# Patient Record
Sex: Male | Born: 1960 | Race: White | Hispanic: No | Marital: Married | Smoking: Never smoker
Health system: Southern US, Community
[De-identification: ages and names within clinical notes are randomized; demographics above are authoritative.]

## PROBLEM LIST (undated history)

## (undated) DIAGNOSIS — Z87442 Personal history of urinary calculi: Secondary | ICD-10-CM

## (undated) DIAGNOSIS — M179 Osteoarthritis of knee, unspecified: Secondary | ICD-10-CM

## (undated) DIAGNOSIS — G709 Myoneural disorder, unspecified: Secondary | ICD-10-CM

## (undated) DIAGNOSIS — F4310 Post-traumatic stress disorder, unspecified: Secondary | ICD-10-CM

## (undated) DIAGNOSIS — I712 Thoracic aortic aneurysm, without rupture: Secondary | ICD-10-CM

## (undated) DIAGNOSIS — E785 Hyperlipidemia, unspecified: Secondary | ICD-10-CM

## (undated) DIAGNOSIS — R7989 Other specified abnormal findings of blood chemistry: Secondary | ICD-10-CM

## (undated) DIAGNOSIS — R519 Headache, unspecified: Secondary | ICD-10-CM

## (undated) DIAGNOSIS — IMO0002 Reserved for concepts with insufficient information to code with codable children: Secondary | ICD-10-CM

## (undated) DIAGNOSIS — M1710 Unilateral primary osteoarthritis, unspecified knee: Secondary | ICD-10-CM

## (undated) DIAGNOSIS — S0990XA Unspecified injury of head, initial encounter: Secondary | ICD-10-CM

## (undated) DIAGNOSIS — I10 Essential (primary) hypertension: Secondary | ICD-10-CM

## (undated) DIAGNOSIS — K275 Chronic or unspecified peptic ulcer, site unspecified, with perforation: Secondary | ICD-10-CM

## (undated) DIAGNOSIS — M25562 Pain in left knee: Secondary | ICD-10-CM

## (undated) DIAGNOSIS — G629 Polyneuropathy, unspecified: Secondary | ICD-10-CM

## (undated) DIAGNOSIS — R945 Abnormal results of liver function studies: Secondary | ICD-10-CM

## (undated) DIAGNOSIS — F319 Bipolar disorder, unspecified: Secondary | ICD-10-CM

## (undated) DIAGNOSIS — G4733 Obstructive sleep apnea (adult) (pediatric): Secondary | ICD-10-CM

## (undated) DIAGNOSIS — Z9889 Other specified postprocedural states: Secondary | ICD-10-CM

## (undated) DIAGNOSIS — M545 Low back pain, unspecified: Secondary | ICD-10-CM

## (undated) DIAGNOSIS — I2699 Other pulmonary embolism without acute cor pulmonale: Secondary | ICD-10-CM

## (undated) DIAGNOSIS — Z9289 Personal history of other medical treatment: Secondary | ICD-10-CM

## (undated) DIAGNOSIS — F4323 Adjustment disorder with mixed anxiety and depressed mood: Secondary | ICD-10-CM

## (undated) DIAGNOSIS — R112 Nausea with vomiting, unspecified: Secondary | ICD-10-CM

## (undated) DIAGNOSIS — Z87820 Personal history of traumatic brain injury: Secondary | ICD-10-CM

## (undated) DIAGNOSIS — E538 Deficiency of other specified B group vitamins: Secondary | ICD-10-CM

## (undated) DIAGNOSIS — F32A Depression, unspecified: Secondary | ICD-10-CM

## (undated) DIAGNOSIS — K219 Gastro-esophageal reflux disease without esophagitis: Secondary | ICD-10-CM

## (undated) DIAGNOSIS — R51 Headache: Secondary | ICD-10-CM

## (undated) DIAGNOSIS — E669 Obesity, unspecified: Secondary | ICD-10-CM

## (undated) DIAGNOSIS — K859 Acute pancreatitis without necrosis or infection, unspecified: Secondary | ICD-10-CM

## (undated) DIAGNOSIS — I7121 Aneurysm of the ascending aorta, without rupture: Secondary | ICD-10-CM

## (undated) DIAGNOSIS — R079 Chest pain, unspecified: Secondary | ICD-10-CM

## (undated) DIAGNOSIS — F419 Anxiety disorder, unspecified: Secondary | ICD-10-CM

## (undated) HISTORY — DX: Chest pain, unspecified: R07.9

## (undated) HISTORY — PX: TONSILLECTOMY: SUR1361

## (undated) HISTORY — PX: EPIDURAL BLOCK INJECTION: SHX1516

## (undated) HISTORY — DX: Personal history of traumatic brain injury: Z87.820

## (undated) HISTORY — DX: Unilateral primary osteoarthritis, unspecified knee: M17.10

## (undated) HISTORY — PX: FOOT SURGERY: SHX648

## (undated) HISTORY — DX: Osteoarthritis of knee, unspecified: M17.9

## (undated) HISTORY — DX: Adjustment disorder with mixed anxiety and depressed mood: F43.23

## (undated) HISTORY — DX: Essential (primary) hypertension: I10

## (undated) HISTORY — DX: Abnormal results of liver function studies: R94.5

## (undated) HISTORY — DX: Obesity, unspecified: E66.9

## (undated) HISTORY — PX: NASAL SEPTOPLASTY W/ TURBINOPLASTY: SHX2070

## (undated) HISTORY — DX: Personal history of other medical treatment: Z92.89

## (undated) HISTORY — PX: VASECTOMY: SHX75

## (undated) HISTORY — DX: Other pulmonary embolism without acute cor pulmonale: I26.99

## (undated) HISTORY — DX: Deficiency of other specified B group vitamins: E53.8

## (undated) HISTORY — DX: Personal history of urinary calculi: Z87.442

## (undated) HISTORY — PX: DIAGNOSTIC LAPAROSCOPY: SUR761

## (undated) HISTORY — DX: Unspecified injury of head, initial encounter: S09.90XA

## (undated) HISTORY — DX: Thoracic aortic aneurysm, without rupture: I71.2

## (undated) HISTORY — DX: Low back pain, unspecified: M54.50

## (undated) HISTORY — DX: Aneurysm of the ascending aorta, without rupture: I71.21

## (undated) HISTORY — DX: Hyperlipidemia, unspecified: E78.5

## (undated) HISTORY — DX: Other specified abnormal findings of blood chemistry: R79.89

## (undated) HISTORY — DX: Pain in left knee: M25.562

## (undated) HISTORY — DX: Obstructive sleep apnea (adult) (pediatric): G47.33

## (undated) HISTORY — DX: Chronic or unspecified peptic ulcer, site unspecified, with perforation: K27.5

## (undated) HISTORY — PX: OTHER SURGICAL HISTORY: SHX169

---

## 1996-09-15 HISTORY — PX: HERNIA REPAIR: SHX51

## 1999-01-10 ENCOUNTER — Emergency Department (HOSPITAL_COMMUNITY): Admission: EM | Admit: 1999-01-10 | Discharge: 1999-01-10 | Payer: Self-pay | Admitting: Emergency Medicine

## 1999-01-10 ENCOUNTER — Encounter: Payer: Self-pay | Admitting: Emergency Medicine

## 2000-01-21 ENCOUNTER — Encounter: Payer: Self-pay | Admitting: Gastroenterology

## 2000-01-21 ENCOUNTER — Encounter: Admission: RE | Admit: 2000-01-21 | Discharge: 2000-01-21 | Payer: Self-pay | Admitting: Gastroenterology

## 2001-01-19 ENCOUNTER — Encounter: Admission: RE | Admit: 2001-01-19 | Discharge: 2001-01-19 | Payer: Self-pay | Admitting: Specialist

## 2001-01-19 ENCOUNTER — Encounter: Payer: Self-pay | Admitting: Specialist

## 2002-01-19 ENCOUNTER — Observation Stay (HOSPITAL_COMMUNITY): Admission: EM | Admit: 2002-01-19 | Discharge: 2002-01-20 | Payer: Self-pay | Admitting: Cardiology

## 2004-05-09 ENCOUNTER — Encounter: Admission: RE | Admit: 2004-05-09 | Discharge: 2004-08-07 | Payer: Self-pay | Admitting: Neurology

## 2004-06-19 ENCOUNTER — Ambulatory Visit: Payer: Self-pay | Admitting: Psychology

## 2004-06-21 ENCOUNTER — Encounter (INDEPENDENT_AMBULATORY_CARE_PROVIDER_SITE_OTHER): Payer: Self-pay | Admitting: Specialist

## 2004-06-21 ENCOUNTER — Ambulatory Visit (HOSPITAL_COMMUNITY): Admission: RE | Admit: 2004-06-21 | Discharge: 2004-06-21 | Payer: Self-pay

## 2004-06-21 ENCOUNTER — Ambulatory Visit (HOSPITAL_BASED_OUTPATIENT_CLINIC_OR_DEPARTMENT_OTHER): Admission: RE | Admit: 2004-06-21 | Discharge: 2004-06-21 | Payer: Self-pay

## 2004-07-01 ENCOUNTER — Encounter: Admission: RE | Admit: 2004-07-01 | Discharge: 2004-07-01 | Payer: Self-pay | Admitting: Internal Medicine

## 2004-07-08 ENCOUNTER — Encounter: Admission: RE | Admit: 2004-07-08 | Discharge: 2004-08-22 | Payer: Self-pay | Admitting: Internal Medicine

## 2004-07-29 ENCOUNTER — Ambulatory Visit: Payer: Self-pay | Admitting: Internal Medicine

## 2004-07-30 ENCOUNTER — Ambulatory Visit: Payer: Self-pay | Admitting: Psychology

## 2004-08-01 ENCOUNTER — Ambulatory Visit: Payer: Self-pay | Admitting: Internal Medicine

## 2004-09-19 ENCOUNTER — Ambulatory Visit: Payer: Self-pay | Admitting: Internal Medicine

## 2005-03-02 ENCOUNTER — Emergency Department (HOSPITAL_COMMUNITY): Admission: EM | Admit: 2005-03-02 | Discharge: 2005-03-02 | Payer: Self-pay | Admitting: Family Medicine

## 2005-03-10 ENCOUNTER — Ambulatory Visit: Payer: Self-pay | Admitting: Internal Medicine

## 2005-09-07 ENCOUNTER — Emergency Department (HOSPITAL_COMMUNITY): Admission: EM | Admit: 2005-09-07 | Discharge: 2005-09-07 | Payer: Self-pay | Admitting: Family Medicine

## 2005-09-15 HISTORY — PX: UVULOPALATOPHARYNGOPLASTY: SHX827

## 2005-09-18 ENCOUNTER — Ambulatory Visit: Payer: Self-pay | Admitting: Internal Medicine

## 2005-09-19 ENCOUNTER — Ambulatory Visit: Payer: Self-pay | Admitting: Cardiology

## 2005-10-08 ENCOUNTER — Ambulatory Visit: Payer: Self-pay | Admitting: Internal Medicine

## 2005-10-13 ENCOUNTER — Ambulatory Visit: Payer: Self-pay

## 2005-10-21 ENCOUNTER — Encounter: Payer: Self-pay | Admitting: Internal Medicine

## 2005-10-21 ENCOUNTER — Ambulatory Visit: Payer: Self-pay | Admitting: Emergency Medicine

## 2005-11-03 ENCOUNTER — Ambulatory Visit: Payer: Self-pay | Admitting: Internal Medicine

## 2005-11-07 ENCOUNTER — Ambulatory Visit: Payer: Self-pay | Admitting: Internal Medicine

## 2005-11-19 ENCOUNTER — Encounter: Payer: Self-pay | Admitting: Internal Medicine

## 2005-11-19 ENCOUNTER — Ambulatory Visit: Payer: Self-pay | Admitting: Emergency Medicine

## 2005-12-12 ENCOUNTER — Ambulatory Visit: Payer: Self-pay | Admitting: Internal Medicine

## 2005-12-22 ENCOUNTER — Ambulatory Visit: Payer: Self-pay | Admitting: Internal Medicine

## 2006-01-30 ENCOUNTER — Ambulatory Visit: Payer: Self-pay | Admitting: Internal Medicine

## 2006-02-10 ENCOUNTER — Ambulatory Visit: Payer: Self-pay | Admitting: Internal Medicine

## 2006-07-31 ENCOUNTER — Encounter (INDEPENDENT_AMBULATORY_CARE_PROVIDER_SITE_OTHER): Payer: Self-pay | Admitting: *Deleted

## 2006-07-31 ENCOUNTER — Ambulatory Visit (HOSPITAL_COMMUNITY): Admission: RE | Admit: 2006-07-31 | Discharge: 2006-08-01 | Payer: Self-pay | Admitting: Otolaryngology

## 2006-09-15 HISTORY — PX: CARPAL TUNNEL RELEASE: SHX101

## 2007-04-01 ENCOUNTER — Ambulatory Visit: Payer: Self-pay | Admitting: Internal Medicine

## 2007-04-01 LAB — CONVERTED CEMR LAB
ALT: 198 units/L — ABNORMAL HIGH (ref 0–53)
AST: 143 units/L — ABNORMAL HIGH (ref 0–37)
Albumin: 4.2 g/dL (ref 3.5–5.2)
Alkaline Phosphatase: 106 units/L (ref 39–117)
BUN: 9 mg/dL (ref 6–23)
Bilirubin, Direct: 0.1 mg/dL (ref 0.0–0.3)
CO2: 30 meq/L (ref 19–32)
Calcium: 9.6 mg/dL (ref 8.4–10.5)
Chloride: 98 meq/L (ref 96–112)
Cholesterol: 170 mg/dL (ref 0–200)
Creatinine, Ser: 1.1 mg/dL (ref 0.4–1.5)
Direct LDL: 113.2 mg/dL
GFR calc Af Amer: 93 mL/min
GFR calc non Af Amer: 77 mL/min
Glucose, Bld: 382 mg/dL — ABNORMAL HIGH (ref 70–99)
HDL: 26.3 mg/dL — ABNORMAL LOW (ref >39.0)
Hgb A1c MFr Bld: 13.2 % — ABNORMAL HIGH (ref 4.6–6.0)
Potassium: 4.1 meq/L (ref 3.5–5.1)
Sodium: 138 meq/L (ref 135–145)
TSH: 1.23 microintl units/mL (ref 0.35–5.50)
Total Bilirubin: 1.1 mg/dL (ref 0.3–1.2)
Total CHOL/HDL Ratio: 6.5
Total Protein: 7.9 g/dL (ref 6.0–8.3)
Triglycerides: 251 mg/dL (ref 0–149)
VLDL: 50 mg/dL — ABNORMAL HIGH (ref 0–40)

## 2007-04-02 ENCOUNTER — Telehealth: Payer: Self-pay | Admitting: *Deleted

## 2007-04-06 ENCOUNTER — Ambulatory Visit: Payer: Self-pay | Admitting: Internal Medicine

## 2007-04-06 DIAGNOSIS — G5600 Carpal tunnel syndrome, unspecified upper limb: Secondary | ICD-10-CM

## 2007-04-06 DIAGNOSIS — E119 Type 2 diabetes mellitus without complications: Secondary | ICD-10-CM | POA: Insufficient documentation

## 2007-04-06 DIAGNOSIS — E538 Deficiency of other specified B group vitamins: Secondary | ICD-10-CM | POA: Insufficient documentation

## 2007-04-06 DIAGNOSIS — E785 Hyperlipidemia, unspecified: Secondary | ICD-10-CM | POA: Insufficient documentation

## 2007-04-06 HISTORY — DX: Carpal tunnel syndrome, unspecified upper limb: G56.00

## 2007-04-06 LAB — CONVERTED CEMR LAB: Blood Glucose, Fingerstick: 125

## 2007-04-21 ENCOUNTER — Telehealth: Payer: Self-pay | Admitting: Internal Medicine

## 2007-04-28 ENCOUNTER — Ambulatory Visit: Payer: Self-pay | Admitting: Internal Medicine

## 2007-04-28 LAB — CONVERTED CEMR LAB
ALT: 105 units/L — ABNORMAL HIGH (ref 0–53)
AST: 92 units/L — ABNORMAL HIGH (ref 0–37)
Albumin: 4.3 g/dL (ref 3.5–5.2)
Alkaline Phosphatase: 58 units/L (ref 39–117)
BUN: 14 mg/dL (ref 6–23)
Bilirubin, Direct: 0.1 mg/dL (ref 0.0–0.3)
CO2: 29 meq/L (ref 19–32)
Calcium: 9.2 mg/dL (ref 8.4–10.5)
Chloride: 101 meq/L (ref 96–112)
Creatinine, Ser: 1.1 mg/dL (ref 0.4–1.5)
Folate: 9.7 ng/mL
GFR calc Af Amer: 93 mL/min
GFR calc non Af Amer: 77 mL/min
Glucose, Bld: 114 mg/dL — ABNORMAL HIGH (ref 70–99)
Potassium: 3.9 meq/L (ref 3.5–5.1)
Sodium: 137 meq/L (ref 135–145)
Total Bilirubin: 0.9 mg/dL (ref 0.3–1.2)
Total Protein: 7.4 g/dL (ref 6.0–8.3)
Vitamin B-12: 227 pg/mL (ref 211–911)

## 2007-05-05 ENCOUNTER — Ambulatory Visit: Payer: Self-pay | Admitting: Internal Medicine

## 2007-05-18 ENCOUNTER — Telehealth: Payer: Self-pay | Admitting: Internal Medicine

## 2007-05-21 ENCOUNTER — Telehealth: Payer: Self-pay | Admitting: Internal Medicine

## 2007-07-19 ENCOUNTER — Telehealth: Payer: Self-pay | Admitting: Internal Medicine

## 2007-07-19 ENCOUNTER — Ambulatory Visit: Payer: Self-pay | Admitting: Internal Medicine

## 2007-07-20 ENCOUNTER — Telehealth: Payer: Self-pay | Admitting: Internal Medicine

## 2007-07-22 DIAGNOSIS — G4733 Obstructive sleep apnea (adult) (pediatric): Secondary | ICD-10-CM | POA: Insufficient documentation

## 2007-07-22 DIAGNOSIS — J45909 Unspecified asthma, uncomplicated: Secondary | ICD-10-CM | POA: Insufficient documentation

## 2007-07-24 ENCOUNTER — Encounter: Payer: Self-pay | Admitting: Internal Medicine

## 2007-07-26 LAB — CONVERTED CEMR LAB
ALT: 80 units/L — ABNORMAL HIGH (ref 0–53)
AST: 48 units/L — ABNORMAL HIGH (ref 0–37)
Albumin: 4 g/dL (ref 3.5–5.2)
Alkaline Phosphatase: 59 units/L (ref 39–117)
Bilirubin, Direct: 0.1 mg/dL (ref 0.0–0.3)
Creatinine,U: 112.9 mg/dL
Hgb A1c MFr Bld: 6.3 % — ABNORMAL HIGH (ref 4.6–6.0)
Microalb Creat Ratio: 3.5 mg/g (ref 0.0–30.0)
Microalb, Ur: 0.4 mg/dL (ref 0.0–1.9)
Total Bilirubin: 0.8 mg/dL (ref 0.3–1.2)
Total Protein: 7.1 g/dL (ref 6.0–8.3)

## 2007-08-02 ENCOUNTER — Ambulatory Visit: Payer: Self-pay | Admitting: Internal Medicine

## 2007-08-02 DIAGNOSIS — M109 Gout, unspecified: Secondary | ICD-10-CM | POA: Insufficient documentation

## 2007-08-13 ENCOUNTER — Ambulatory Visit: Payer: Self-pay | Admitting: Pulmonary Disease

## 2007-08-20 ENCOUNTER — Encounter: Payer: Self-pay | Admitting: Internal Medicine

## 2007-09-02 ENCOUNTER — Encounter: Payer: Self-pay | Admitting: Pulmonary Disease

## 2007-09-13 ENCOUNTER — Encounter: Payer: Self-pay | Admitting: Internal Medicine

## 2007-09-21 ENCOUNTER — Encounter: Payer: Self-pay | Admitting: Internal Medicine

## 2007-11-12 ENCOUNTER — Ambulatory Visit: Payer: Self-pay | Admitting: Internal Medicine

## 2007-11-12 LAB — CONVERTED CEMR LAB: Blood Glucose, Fingerstick: 320

## 2007-11-19 ENCOUNTER — Ambulatory Visit: Payer: Self-pay | Admitting: Internal Medicine

## 2007-11-19 LAB — CONVERTED CEMR LAB
Blood Glucose, Fingerstick: 154
Inflenza A Ag: NEGATIVE
Rapid Strep: NEGATIVE

## 2007-12-10 ENCOUNTER — Ambulatory Visit: Payer: Self-pay | Admitting: Internal Medicine

## 2007-12-10 DIAGNOSIS — E669 Obesity, unspecified: Secondary | ICD-10-CM | POA: Insufficient documentation

## 2007-12-10 HISTORY — DX: Obesity, unspecified: E66.9

## 2008-01-28 ENCOUNTER — Ambulatory Visit: Payer: Self-pay | Admitting: Internal Medicine

## 2008-01-30 LAB — CONVERTED CEMR LAB
ALT: 158 units/L — ABNORMAL HIGH (ref 0–53)
AST: 124 units/L — ABNORMAL HIGH (ref 0–37)
Albumin: 4.2 g/dL (ref 3.5–5.2)
Alkaline Phosphatase: 64 units/L (ref 39–117)
BUN: 10 mg/dL (ref 6–23)
Bilirubin, Direct: 0.1 mg/dL (ref 0.0–0.3)
CO2: 33 meq/L — ABNORMAL HIGH (ref 19–32)
Calcium: 9.7 mg/dL (ref 8.4–10.5)
Chloride: 108 meq/L (ref 96–112)
Cholesterol: 197 mg/dL (ref 0–200)
Creatinine, Ser: 1.2 mg/dL (ref 0.4–1.5)
Creatinine,U: 139.3 mg/dL
GFR calc Af Amer: 84 mL/min
GFR calc non Af Amer: 69 mL/min
Glucose, Bld: 138 mg/dL — ABNORMAL HIGH (ref 70–99)
HDL: 24.9 mg/dL — ABNORMAL LOW (ref >39.0)
Hgb A1c MFr Bld: 7 % — ABNORMAL HIGH (ref 4.6–6.0)
LDL Cholesterol: 146 mg/dL — ABNORMAL HIGH (ref 0–99)
Microalb Creat Ratio: 3.6 mg/g (ref 0.0–30.0)
Microalb, Ur: 0.5 mg/dL (ref 0.0–1.9)
Potassium: 4.5 meq/L (ref 3.5–5.1)
Sodium: 147 meq/L — ABNORMAL HIGH (ref 135–145)
TSH: 1 microintl units/mL (ref 0.35–5.50)
Total Bilirubin: 0.8 mg/dL (ref 0.3–1.2)
Total CHOL/HDL Ratio: 7.9
Total Protein: 7.9 g/dL (ref 6.0–8.3)
Triglycerides: 132 mg/dL (ref 0–149)
VLDL: 26 mg/dL (ref 0–40)

## 2008-02-04 ENCOUNTER — Ambulatory Visit: Payer: Self-pay | Admitting: Internal Medicine

## 2008-02-04 DIAGNOSIS — T50995A Adverse effect of other drugs, medicaments and biological substances, initial encounter: Secondary | ICD-10-CM | POA: Insufficient documentation

## 2008-02-04 LAB — CONVERTED CEMR LAB: Blood Glucose, Fingerstick: 262

## 2008-02-08 ENCOUNTER — Encounter (INDEPENDENT_AMBULATORY_CARE_PROVIDER_SITE_OTHER): Payer: Self-pay | Admitting: *Deleted

## 2008-03-31 ENCOUNTER — Telehealth: Payer: Self-pay | Admitting: *Deleted

## 2008-04-07 ENCOUNTER — Telehealth: Payer: Self-pay | Admitting: *Deleted

## 2008-04-11 DIAGNOSIS — G4730 Sleep apnea, unspecified: Secondary | ICD-10-CM | POA: Insufficient documentation

## 2008-04-14 ENCOUNTER — Ambulatory Visit: Payer: Self-pay | Admitting: Gastroenterology

## 2008-04-17 ENCOUNTER — Telehealth: Payer: Self-pay | Admitting: Gastroenterology

## 2008-04-18 ENCOUNTER — Ambulatory Visit: Payer: Self-pay | Admitting: Gastroenterology

## 2008-04-19 ENCOUNTER — Encounter: Payer: Self-pay | Admitting: Gastroenterology

## 2008-04-24 ENCOUNTER — Telehealth: Payer: Self-pay | Admitting: *Deleted

## 2008-05-23 ENCOUNTER — Telehealth: Payer: Self-pay | Admitting: *Deleted

## 2008-06-04 ENCOUNTER — Encounter: Payer: Self-pay | Admitting: Pulmonary Disease

## 2008-07-31 ENCOUNTER — Encounter: Payer: Self-pay | Admitting: Internal Medicine

## 2008-08-28 ENCOUNTER — Telehealth (INDEPENDENT_AMBULATORY_CARE_PROVIDER_SITE_OTHER): Payer: Self-pay | Admitting: *Deleted

## 2008-09-21 ENCOUNTER — Telehealth: Payer: Self-pay | Admitting: *Deleted

## 2008-10-13 ENCOUNTER — Ambulatory Visit: Payer: Self-pay | Admitting: Internal Medicine

## 2008-10-13 LAB — CONVERTED CEMR LAB
Cholesterol: 168 mg/dL (ref 0–200)
HDL: 29.7 mg/dL — ABNORMAL LOW (ref >39.0)
Hgb A1c MFr Bld: 6.8 % — ABNORMAL HIGH (ref 4.6–6.0)
LDL Cholesterol: 118 mg/dL — ABNORMAL HIGH (ref 0–99)
Total CHOL/HDL Ratio: 5.7
Triglycerides: 100 mg/dL (ref 0–149)
VLDL: 20 mg/dL (ref 0–40)

## 2008-10-20 ENCOUNTER — Telehealth: Payer: Self-pay | Admitting: Pulmonary Disease

## 2008-10-20 ENCOUNTER — Ambulatory Visit: Payer: Self-pay | Admitting: Internal Medicine

## 2008-10-20 DIAGNOSIS — M549 Dorsalgia, unspecified: Secondary | ICD-10-CM | POA: Insufficient documentation

## 2008-11-01 ENCOUNTER — Encounter: Payer: Self-pay | Admitting: Internal Medicine

## 2008-11-08 ENCOUNTER — Encounter: Admission: RE | Admit: 2008-11-08 | Discharge: 2008-11-08 | Payer: Self-pay | Admitting: Gastroenterology

## 2008-11-16 ENCOUNTER — Encounter: Payer: Self-pay | Admitting: Gastroenterology

## 2008-11-16 ENCOUNTER — Encounter: Admission: RE | Admit: 2008-11-16 | Discharge: 2008-11-16 | Payer: Self-pay | Admitting: Gastroenterology

## 2009-05-11 ENCOUNTER — Ambulatory Visit: Payer: Self-pay | Admitting: Internal Medicine

## 2009-05-11 LAB — CONVERTED CEMR LAB
ALT: 121 units/L — ABNORMAL HIGH (ref 0–53)
AST: 91 units/L — ABNORMAL HIGH (ref 0–37)
Albumin: 4.3 g/dL (ref 3.5–5.2)
Alkaline Phosphatase: 66 units/L (ref 39–117)
BUN: 13 mg/dL (ref 6–23)
Basophils Absolute: 0 10*3/uL (ref 0.0–0.1)
Basophils Relative: 0.3 % (ref 0.0–3.0)
Bilirubin, Direct: 0.1 mg/dL (ref 0.0–0.3)
CO2: 30 meq/L (ref 19–32)
Calcium: 8.9 mg/dL (ref 8.4–10.5)
Chloride: 107 meq/L (ref 96–112)
Creatinine, Ser: 1.2 mg/dL (ref 0.4–1.5)
Creatinine,U: 220.8 mg/dL
Eosinophils Absolute: 0.2 10*3/uL (ref 0.0–0.7)
Eosinophils Relative: 2.8 % (ref 0.0–5.0)
GFR calc non Af Amer: 68.79 mL/min (ref >60)
Glucose, Bld: 162 mg/dL — ABNORMAL HIGH (ref 70–99)
HCT: 46.2 % (ref 39.0–52.0)
Hemoglobin: 16 g/dL (ref 13.0–17.0)
Hgb A1c MFr Bld: 7.9 % — ABNORMAL HIGH (ref 4.6–6.5)
Lymphocytes Relative: 41.6 % (ref 12.0–46.0)
Lymphs Abs: 2.3 10*3/uL (ref 0.7–4.0)
MCHC: 34.6 g/dL (ref 30.0–36.0)
MCV: 95.7 fL (ref 78.0–100.0)
Microalb Creat Ratio: 3.2 mg/g (ref 0.0–30.0)
Microalb, Ur: 0.7 mg/dL (ref 0.0–1.9)
Monocytes Absolute: 0.3 10*3/uL (ref 0.1–1.0)
Monocytes Relative: 6.2 % (ref 3.0–12.0)
Neutro Abs: 2.7 10*3/uL (ref 1.4–7.7)
Neutrophils Relative %: 49.1 % (ref 43.0–77.0)
Platelets: 131 10*3/uL — ABNORMAL LOW (ref 150.0–400.0)
Potassium: 4 meq/L (ref 3.5–5.1)
RBC: 4.83 M/uL (ref 4.22–5.81)
RDW: 12.7 % (ref 11.5–14.6)
Sodium: 142 meq/L (ref 135–145)
TSH: 1.29 microintl units/mL (ref 0.35–5.50)
Total Bilirubin: 1 mg/dL (ref 0.3–1.2)
Total Protein: 7.7 g/dL (ref 6.0–8.3)
Vitamin B-12: 214 pg/mL (ref 211–911)
WBC: 5.5 10*3/uL (ref 4.5–10.5)

## 2009-05-15 ENCOUNTER — Ambulatory Visit: Payer: Self-pay | Admitting: Internal Medicine

## 2009-06-19 ENCOUNTER — Ambulatory Visit: Payer: Self-pay | Admitting: Gastroenterology

## 2009-07-11 ENCOUNTER — Encounter: Payer: Self-pay | Admitting: Internal Medicine

## 2009-07-12 ENCOUNTER — Encounter: Payer: Self-pay | Admitting: Internal Medicine

## 2009-07-26 ENCOUNTER — Encounter (INDEPENDENT_AMBULATORY_CARE_PROVIDER_SITE_OTHER): Payer: Self-pay | Admitting: *Deleted

## 2009-07-26 ENCOUNTER — Ambulatory Visit: Payer: Self-pay | Admitting: Internal Medicine

## 2009-07-31 ENCOUNTER — Encounter: Admission: RE | Admit: 2009-07-31 | Discharge: 2009-09-12 | Payer: Self-pay | Admitting: Internal Medicine

## 2009-07-31 ENCOUNTER — Encounter: Payer: Self-pay | Admitting: Internal Medicine

## 2009-08-02 ENCOUNTER — Encounter (INDEPENDENT_AMBULATORY_CARE_PROVIDER_SITE_OTHER): Payer: Self-pay | Admitting: *Deleted

## 2009-08-06 ENCOUNTER — Telehealth: Payer: Self-pay | Admitting: *Deleted

## 2009-08-08 ENCOUNTER — Ambulatory Visit: Payer: Self-pay | Admitting: Internal Medicine

## 2009-08-17 ENCOUNTER — Encounter (INDEPENDENT_AMBULATORY_CARE_PROVIDER_SITE_OTHER): Payer: Self-pay | Admitting: *Deleted

## 2009-08-20 LAB — CONVERTED CEMR LAB
ALT: 107 units/L — ABNORMAL HIGH (ref 0–53)
AST: 86 units/L — ABNORMAL HIGH (ref 0–37)
Albumin: 4.1 g/dL (ref 3.5–5.2)
Alkaline Phosphatase: 69 units/L (ref 39–117)
Bilirubin, Direct: 0.1 mg/dL (ref 0.0–0.3)
Hgb A1c MFr Bld: 7.6 % — ABNORMAL HIGH (ref 4.6–6.5)
Total Bilirubin: 1 mg/dL (ref 0.3–1.2)
Total Protein: 7.4 g/dL (ref 6.0–8.3)

## 2009-08-23 ENCOUNTER — Telehealth: Payer: Self-pay | Admitting: Internal Medicine

## 2009-08-23 ENCOUNTER — Ambulatory Visit: Payer: Self-pay | Admitting: Internal Medicine

## 2009-08-23 DIAGNOSIS — F4323 Adjustment disorder with mixed anxiety and depressed mood: Secondary | ICD-10-CM

## 2009-08-23 HISTORY — DX: Adjustment disorder with mixed anxiety and depressed mood: F43.23

## 2009-08-24 ENCOUNTER — Encounter (INDEPENDENT_AMBULATORY_CARE_PROVIDER_SITE_OTHER): Payer: Self-pay | Admitting: *Deleted

## 2009-08-28 ENCOUNTER — Ambulatory Visit (HOSPITAL_COMMUNITY): Admission: RE | Admit: 2009-08-28 | Discharge: 2009-08-28 | Payer: Self-pay | Admitting: Otolaryngology

## 2009-08-29 ENCOUNTER — Ambulatory Visit (HOSPITAL_COMMUNITY): Admission: RE | Admit: 2009-08-29 | Discharge: 2009-08-29 | Payer: Self-pay | Admitting: Surgery

## 2009-09-24 ENCOUNTER — Ambulatory Visit: Payer: Self-pay | Admitting: Internal Medicine

## 2009-09-24 ENCOUNTER — Encounter: Admission: RE | Admit: 2009-09-24 | Discharge: 2009-12-23 | Payer: Self-pay | Admitting: Surgery

## 2009-10-16 LAB — HM DIABETES EYE EXAM

## 2009-10-18 ENCOUNTER — Encounter: Admission: RE | Admit: 2009-10-18 | Discharge: 2010-01-16 | Payer: Self-pay | Admitting: Surgery

## 2009-10-22 ENCOUNTER — Telehealth: Payer: Self-pay | Admitting: *Deleted

## 2009-10-22 ENCOUNTER — Encounter: Payer: Self-pay | Admitting: Internal Medicine

## 2009-11-06 ENCOUNTER — Inpatient Hospital Stay (HOSPITAL_COMMUNITY): Admission: RE | Admit: 2009-11-06 | Discharge: 2009-11-08 | Payer: Self-pay | Admitting: Surgery

## 2009-11-06 HISTORY — PX: GASTRIC BYPASS: SHX52

## 2009-11-07 ENCOUNTER — Ambulatory Visit: Payer: Self-pay | Admitting: Vascular Surgery

## 2009-11-07 ENCOUNTER — Encounter (INDEPENDENT_AMBULATORY_CARE_PROVIDER_SITE_OTHER): Payer: Self-pay | Admitting: Surgery

## 2009-11-19 ENCOUNTER — Ambulatory Visit: Payer: Self-pay | Admitting: Internal Medicine

## 2009-11-19 LAB — CONVERTED CEMR LAB
ALT: 25 units/L (ref 0–53)
AST: 28 units/L (ref 0–37)
Albumin: 3.9 g/dL (ref 3.5–5.2)
Alkaline Phosphatase: 61 units/L (ref 39–117)
BUN: 18 mg/dL (ref 6–23)
Bilirubin, Direct: 0.2 mg/dL (ref 0.0–0.3)
CO2: 29 meq/L (ref 19–32)
Calcium: 9.2 mg/dL (ref 8.4–10.5)
Chloride: 103 meq/L (ref 96–112)
Cholesterol: 131 mg/dL (ref 0–200)
Creatinine, Ser: 1.4 mg/dL (ref 0.4–1.5)
GFR calc non Af Amer: 57.45 mL/min (ref >60)
Glucose, Bld: 116 mg/dL — ABNORMAL HIGH (ref 70–99)
HDL: 28.2 mg/dL — ABNORMAL LOW (ref >39.00)
Hgb A1c MFr Bld: 6.1 % (ref 4.6–6.5)
LDL Cholesterol: 74 mg/dL (ref 0–99)
Potassium: 4.2 meq/L (ref 3.5–5.1)
Sodium: 139 meq/L (ref 135–145)
Total Bilirubin: 0.6 mg/dL (ref 0.3–1.2)
Total CHOL/HDL Ratio: 5
Total Protein: 8 g/dL (ref 6.0–8.3)
Triglycerides: 142 mg/dL (ref 0.0–149.0)
VLDL: 28.4 mg/dL (ref 0.0–40.0)

## 2009-11-22 ENCOUNTER — Encounter: Payer: Self-pay | Admitting: Internal Medicine

## 2009-11-26 ENCOUNTER — Ambulatory Visit: Payer: Self-pay | Admitting: Internal Medicine

## 2009-12-28 ENCOUNTER — Telehealth: Payer: Self-pay | Admitting: *Deleted

## 2010-01-01 ENCOUNTER — Telehealth: Payer: Self-pay | Admitting: *Deleted

## 2010-02-18 ENCOUNTER — Ambulatory Visit: Payer: Self-pay | Admitting: Internal Medicine

## 2010-02-18 ENCOUNTER — Encounter: Payer: Self-pay | Admitting: Pulmonary Disease

## 2010-02-18 LAB — CONVERTED CEMR LAB
BUN: 9 mg/dL (ref 6–23)
Basophils Absolute: 0 10*3/uL (ref 0.0–0.1)
Basophils Relative: 0.4 % (ref 0.0–3.0)
CO2: 33 meq/L — ABNORMAL HIGH (ref 19–32)
Calcium: 9.6 mg/dL (ref 8.4–10.5)
Chloride: 106 meq/L (ref 96–112)
Creatinine, Ser: 1.1 mg/dL (ref 0.4–1.5)
Eosinophils Absolute: 0.1 10*3/uL (ref 0.0–0.7)
Eosinophils Relative: 1.4 % (ref 0.0–5.0)
GFR calc non Af Amer: 76.61 mL/min (ref >60)
Glucose, Bld: 118 mg/dL — ABNORMAL HIGH (ref 70–99)
HCT: 47.5 % (ref 39.0–52.0)
Hemoglobin: 16 g/dL (ref 13.0–17.0)
Hgb A1c MFr Bld: 5.7 % (ref 4.6–6.5)
Lymphocytes Relative: 29.7 % (ref 12.0–46.0)
Lymphs Abs: 1.9 10*3/uL (ref 0.7–4.0)
MCHC: 33.7 g/dL (ref 30.0–36.0)
MCV: 95.8 fL (ref 78.0–100.0)
Monocytes Absolute: 0.4 10*3/uL (ref 0.1–1.0)
Monocytes Relative: 5.5 % (ref 3.0–12.0)
Neutro Abs: 4 10*3/uL (ref 1.4–7.7)
Neutrophils Relative %: 63 % (ref 43.0–77.0)
Platelets: 132 10*3/uL — ABNORMAL LOW (ref 150.0–400.0)
Potassium: 5.2 meq/L — ABNORMAL HIGH (ref 3.5–5.1)
RBC: 4.96 M/uL (ref 4.22–5.81)
RDW: 14.9 % — ABNORMAL HIGH (ref 11.5–14.6)
Sodium: 146 meq/L — ABNORMAL HIGH (ref 135–145)
Uric Acid, Serum: 7.1 mg/dL (ref 4.0–7.8)
WBC: 6.4 10*3/uL (ref 4.5–10.5)

## 2010-03-04 ENCOUNTER — Ambulatory Visit: Payer: Self-pay | Admitting: Internal Medicine

## 2010-03-04 LAB — HM DIABETES FOOT EXAM

## 2010-03-11 ENCOUNTER — Encounter: Payer: Self-pay | Admitting: *Deleted

## 2010-04-09 ENCOUNTER — Encounter: Payer: Self-pay | Admitting: Pulmonary Disease

## 2010-05-17 ENCOUNTER — Ambulatory Visit: Payer: Self-pay | Admitting: Internal Medicine

## 2010-05-17 LAB — CONVERTED CEMR LAB
ALT: 16 units/L (ref 0–53)
AST: 20 units/L (ref 0–37)
Albumin: 3.9 g/dL (ref 3.5–5.2)
Alkaline Phosphatase: 73 units/L (ref 39–117)
Basophils Absolute: 0 10*3/uL (ref 0.0–0.1)
Basophils Relative: 0.3 % (ref 0.0–3.0)
Bilirubin, Direct: 0.1 mg/dL (ref 0.0–0.3)
Cholesterol: 145 mg/dL (ref 0–200)
Eosinophils Absolute: 0.1 10*3/uL (ref 0.0–0.7)
Eosinophils Relative: 2.5 % (ref 0.0–5.0)
HCT: 42.7 % (ref 39.0–52.0)
HDL: 33.3 mg/dL — ABNORMAL LOW (ref >39.00)
Hemoglobin: 14.6 g/dL (ref 13.0–17.0)
Hgb A1c MFr Bld: 6.3 % (ref 4.6–6.5)
LDL Cholesterol: 85 mg/dL (ref 0–99)
Lymphocytes Relative: 36.6 % (ref 12.0–46.0)
Lymphs Abs: 2 10*3/uL (ref 0.7–4.0)
MCHC: 34.1 g/dL (ref 30.0–36.0)
MCV: 93.9 fL (ref 78.0–100.0)
Monocytes Absolute: 0.3 10*3/uL (ref 0.1–1.0)
Monocytes Relative: 6 % (ref 3.0–12.0)
Neutro Abs: 3 10*3/uL (ref 1.4–7.7)
Neutrophils Relative %: 54.6 % (ref 43.0–77.0)
Platelets: 124 10*3/uL — ABNORMAL LOW (ref 150.0–400.0)
RBC: 4.54 M/uL (ref 4.22–5.81)
RDW: 14.2 % (ref 11.5–14.6)
Total Bilirubin: 0.6 mg/dL (ref 0.3–1.2)
Total CHOL/HDL Ratio: 4
Total Protein: 6.3 g/dL (ref 6.0–8.3)
Triglycerides: 134 mg/dL (ref 0.0–149.0)
VLDL: 26.8 mg/dL (ref 0.0–40.0)
Vitamin B-12: 513 pg/mL (ref 211–911)
WBC: 5.4 10*3/uL (ref 4.5–10.5)

## 2010-05-24 ENCOUNTER — Ambulatory Visit: Payer: Self-pay | Admitting: Internal Medicine

## 2010-05-24 DIAGNOSIS — D696 Thrombocytopenia, unspecified: Secondary | ICD-10-CM | POA: Insufficient documentation

## 2010-06-08 ENCOUNTER — Inpatient Hospital Stay (HOSPITAL_COMMUNITY)
Admission: EM | Admit: 2010-06-08 | Discharge: 2010-06-15 | Payer: Self-pay | Source: Home / Self Care | Admitting: Emergency Medicine

## 2010-06-08 HISTORY — PX: OTHER SURGICAL HISTORY: SHX169

## 2010-06-17 ENCOUNTER — Encounter: Payer: Self-pay | Admitting: *Deleted

## 2010-07-04 ENCOUNTER — Encounter: Payer: Self-pay | Admitting: Internal Medicine

## 2010-09-15 HISTORY — PX: BACK SURGERY: SHX140

## 2010-09-20 ENCOUNTER — Ambulatory Visit
Admission: RE | Admit: 2010-09-20 | Discharge: 2010-09-20 | Payer: Self-pay | Source: Home / Self Care | Attending: Internal Medicine | Admitting: Internal Medicine

## 2010-09-20 ENCOUNTER — Other Ambulatory Visit: Payer: Self-pay | Admitting: Internal Medicine

## 2010-09-20 LAB — CBC WITH DIFFERENTIAL/PLATELET
Basophils Absolute: 0 10*3/uL (ref 0.0–0.1)
Basophils Relative: 0.4 % (ref 0.0–3.0)
Eosinophils Absolute: 0.1 10*3/uL (ref 0.0–0.7)
Eosinophils Relative: 1.9 % (ref 0.0–5.0)
HCT: 44.7 % (ref 39.0–52.0)
Hemoglobin: 15.3 g/dL (ref 13.0–17.0)
Lymphocytes Relative: 38.7 % (ref 12.0–46.0)
Lymphs Abs: 2 10*3/uL (ref 0.7–4.0)
MCHC: 34.1 g/dL (ref 30.0–36.0)
MCV: 95.2 fl (ref 78.0–100.0)
Monocytes Absolute: 0.3 10*3/uL (ref 0.1–1.0)
Monocytes Relative: 5.7 % (ref 3.0–12.0)
Neutro Abs: 2.8 10*3/uL (ref 1.4–7.7)
Neutrophils Relative %: 53.3 % (ref 43.0–77.0)
Platelets: 136 10*3/uL — ABNORMAL LOW (ref 150.0–400.0)
RBC: 4.7 Mil/uL (ref 4.22–5.81)
RDW: 13.6 % (ref 11.5–14.6)
WBC: 5.3 10*3/uL (ref 4.5–10.5)

## 2010-09-20 LAB — HEMOGLOBIN A1C: Hgb A1c MFr Bld: 6 % (ref 4.6–6.5)

## 2010-09-30 ENCOUNTER — Ambulatory Visit (HOSPITAL_COMMUNITY)
Admission: RE | Admit: 2010-09-30 | Discharge: 2010-09-30 | Payer: Self-pay | Source: Home / Self Care | Attending: Surgery | Admitting: Surgery

## 2010-10-07 ENCOUNTER — Ambulatory Visit
Admission: RE | Admit: 2010-10-07 | Discharge: 2010-10-07 | Payer: Self-pay | Source: Home / Self Care | Attending: Internal Medicine | Admitting: Internal Medicine

## 2010-10-07 DIAGNOSIS — IMO0002 Reserved for concepts with insufficient information to code with codable children: Secondary | ICD-10-CM | POA: Insufficient documentation

## 2010-10-15 NOTE — Letter (Signed)
Summary: [facility]  [facility]   Imported By: Lester Caroleen 04/17/2010 07:11:48  _____________________________________________________________________  External Attachment:    Type:   Image     Comment:   External Document

## 2010-10-15 NOTE — Assessment & Plan Note (Signed)
Summary: 2 month rov/njr   Vital Signs:  Patient profile:   50 year old male Weight:      247 pounds Pulse rate:   60 / minute BP sitting:   110 / 70  (right arm) Cuff size:   large  Vitals Entered By: Romualdo Bolk, CMA Duncan Dull) (November 26, 2009 2:53 PM) CC: Follow-up visit on labs   History of Present Illness: Hector [name] comes in today for    follow up multiple medical problems after gastric bypass. He is doing well except for falring of gout.   DM   increase  BG    after solid food.  2 metformin and 20 unslui at night.  no lows  doing well has lost 30 #  Gout    flared in right foot and the knee.   Unable to take nsaid until recently complication the course .  ? if suppression rs appropriate.  LIVER:  said looked nl at surgery. LIPids  No se of meds .    Preventive Screening-Counseling & Management  Alcohol-Tobacco     Alcohol drinks/day: <1     Smoking Status: never  Caffeine-Diet-Exercise     Caffeine use/day: 1     Does Patient Exercise: no     Type of exercise: Walking     Times/week: 3  Current Medications (verified): 1)  Indomethacin 50 Mg Caps (Indomethacin) .[name].. 1 Po Qd As Needed 2)  Lexapro 10 Mg Tabs (Escitalopram Oxalate) .... 3 By Mouth Once Daily 3)  Glucophage Xr 500 Mg  Xr24h-Tab (Metformin Hcl) .... 2 Once Daily By Mouth 4)  Bd Ultra-Fine Pen Needles 29g X 12.81mm  Misc (Insulin Pen Needle) .... Use As Directed 5)  Adult Aspirin Low Strength 81 Mg  Tbdp (Aspirin) .... Take 1 Tablet By Mouth Once A Day 6)  Albuterol 90 Mcg/act  Aers (Albuterol) .... Inhale 2 Puffs Every 4 Hrs As Needed 7)  Centrum Silver  Chew (Multiple Vitamins-Minerals) .[name].. 1 Two Times A Day 8)  Lantus Solostar 100 Unit/ml  Soln (Insulin Glargine) .... 20  Units Per Night or As Directed With Pen Needles 9)  Humalog Kwikpen 100 Unit/ml  Soln (Insulin Lispro (Human)) .... Use As Directed 10)  Methocarbamol 750 Mg Tabs (Methocarbamol) .... Per Dr [name] 11)  Co-Gesic 5-500 Mg Tabs  (Hydrocodone-Acetaminophen) .... Per Dr [name] 12)  Amitriptyline Hcl 10 Mg Tabs (Amitriptyline Hcl) .... Take 2-3 By Mouth Qhs For Sleep 13)  Simvastatin 20 Mg Tabs (Simvastatin) .[name].. 1  By Mouth  By Mouth Once Daily 14)  Calcium Citrate +  Tabs (Multiple Minerals-Vitamins) 15)  Cyanocobalamin 2500 Mcg Subl (Cyanocobalamin)  Allergies (verified): No Known Drug Allergies  Past History:  Past medical, surgical, family and social histories (including risk factors) reviewed, and no changes noted (except as noted below).  Past Medical History: Reviewed history from 10/20/2008 and no changes required. 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 Family History of CAD Male 1st degree relative <50 Family History High cholesterol Family History Hypertension Family History of Liver Disease/Cirrhosis:Maternal uncle due to alcohol died age 13 Family History of Uterine Cancer:mother Family History of Colon Cancer:paternal aunt, maternal  uncle Family History of Lung Cancer: Maternal grandmother, paternal grandmother Family History of Diabetes: pateranl uncles, paternal aunts Family History of Heart Disease: materanl grandfather,father  Social History: Reviewed history from 06/19/2009 and no changes required. 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    Physical Exam  General:  Well-developed,well-nourished,in no acute distress; alert,appropriate and cooperative throughout examination Head:  normocephalic and atraumatic.   Eyes:  vision grossly intact, pupils equal, and pupils round.   Neck:  No deformities, masses, or tenderness noted. Lungs:  Normal respiratory effort, chest expands symmetrically. Lungs are clear to auscultation, no crackles or wheezes. Heart:  Normal rate and regular rhythm. S1 and S2 normal without gallop, murmur, click, rub or other extra sounds. Abdomen:  soft, non-tender, no hepatomegaly, and no splenomegaly.  healing scars no hernia   Msk:  no joint warmth and no redness over joints.  minimal right mtp swelling  Pulses:  pulses intact without delay   Extremities:  no clubbing cyanosis or edema  Neurologic:  alert & oriented X3.  slightly antalgic gait  Skin:  turgor normal, color normal, no petechiae, and no purpura.   Cervical Nodes:  No lymphadenopathy noted Psych:  Oriented X3, good eye contact, not anxious appearing, and not depressed appearing.    Diabetes Management Exam:    Foot Exam (with socks and/or shoes not present):       Sensory-Monofilament:          Left foot: normal          Right foot: normal       Inspection:          Right foot: abnormal             Comments: increase calus and skin cracking   no infection       Nails:          Left foot: normal          Right foot: normal   Impression & Recommendations:  Problem # 1:  DIABETES MELLITUS, TYPE II (ICD-250.00) Assessment Improved much better  after weight loss surgery but increase some after adding solids back  . will increase insulin as needed His updated medication list for this problem includes:     Glucophage Xr 500 Mg Xr24h-tab (Metformin hcl) .[name]... 2 once daily by mouth    Adult Aspirin Low Strength 81 Mg Tbdp (Aspirin) .[name]... Take 1 tablet by mouth once a day    Lantus Solostar 100 Unit/ml Soln (Insulin glargine) .[name]... 20  units per night or as directed with pen needles    Humalog Kwikpen 100 Unit/ml Soln (Insulin lispro (human)) ..... Use as directed  Problem # 2:  TRANSAMINASES, SERUM, ELEVATED FATY LIVER (ICD-790.4) Assessment: Improved normal today!      assoicated woith weight loss and diet change  Problem # 3:  BODY MASS INDEX 40 AND OVER ADULT (ICD-V85.4) Assessment: Improved  Problem # 4:  GOUT (ICD-274.9) Assessment: Deteriorated was unable to take nsaid around surgery time so  prolonged  waxing and waning signs  but better today   . foot and knee ? joint.  will begin on allopurinol suppressive  rx and  indocin and monitor Uric acid . His updated medication list for this problem includes:    Indomethacin 50 Mg Caps (Indomethacin) .[name]... 1 po qd as needed    Allopurinol 100 Mg Tabs (Allopurinol) .[name]... Take one by mouth once daily for a week  increase  to 2 by mouth once daily after a week and 3 by mouth once daily for a week  Problem # 5:  HYPERLIPIDEMIA NEC/NOS (ICD-272.4) Assessment: Improved  His updated medication list for this problem includes:    Simvastatin 20 Mg Tabs (Simvastatin) .[name]... 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   Complete Medication List: 1)  Indomethacin 50 Mg Caps (Indomethacin) .[name].. 1 po qd as needed 2)  Lexapro 10 Mg Tabs (Escitalopram oxalate) .... 3 by mouth once daily 3)  Glucophage Xr 500 Mg Xr24h-tab  (Metformin hcl) .... 2 once daily by mouth 4)  Bd Ultra-fine Pen Needles 29g X 12.40mm Misc (Insulin pen needle) .... Use as directed 5)  Adult Aspirin Low Strength 81 Mg Tbdp (Aspirin) .... Take 1 tablet by mouth once a day 6)  Albuterol 90 Mcg/act Aers (Albuterol) .... Inhale 2 puffs every 4 hrs as needed 7)  Centrum Silver Chew (Multiple vitamins-minerals) .[name].. 1 two times a day 8)  Lantus Solostar 100 Unit/ml Soln (Insulin glargine) .... 20  units per night or as directed with pen needles 9)  Humalog Kwikpen 100 Unit/ml Soln (Insulin lispro (human)) .... Use as directed 10)  Methocarbamol 750 Mg Tabs (Methocarbamol) .... Per dr bean 11)  Co-gesic 5-500 Mg Tabs (Hydrocodone-acetaminophen) .... Per dr bean 12)  Amitriptyline Hcl 10 Mg Tabs (Amitriptyline hcl) .... Take 2-3 by mouth qhs for sleep 13)  Simvastatin 20 Mg Tabs (Simvastatin) .[name].. 1  by mouth  by mouth once daily 14)  Calcium Citrate + Tabs (Multiple minerals-vitamins) 15)  Cyanocobalamin 2500 Mcg Subl (Cyanocobalamin) 16)  Allopurinol 100 Mg Tabs (Allopurinol) .... Take one by mouth once daily for a week  increase  to 2 by mouth once daily after a week and 3 by mouth once daily for a week  Patient Instructions: 1)  Agree with     allopurinol  for your gout suppression. dont start until on indocin and no signs for a week  2)  Indocin ok in the meantime.  3)  Uric acid, bm,p and cbcdiff,   hg a1c in 3 months and then   return office visit .   Prescriptions: INDOMETHACIN 50 MG CAPS (INDOMETHACIN) 1 po qd as needed  #30 x 5   Entered and Authorized by:   Madelin Headings MD   Signed by:   Madelin Headings MD on 11/26/2009   Method used:   Electronically to        [facility]  [address] [id number] (retail)       [address]       [place], [place]  [address]       Ph: [phone] or [phone]       Fax: [phone]   RxID:   [id number] ALLOPURINOL 100 MG TABS (ALLOPURINOL) take one by mouth  once daily for a week   increase  to 2 by mouth once daily after a week and 3 by mouth once daily for a week  #90 x 1   Entered and Authorized by:   Madelin Headings MD   Signed by:   Madelin Headings MD on 11/26/2009   Method used:   Electronically to        [facility]  [address] [id number] (retail[facility]       [address]       [place], [place]  [address]       Ph: [phone] or [phone]       Fax: [phone]   RxID:   [id number]

## 2010-10-15 NOTE — Progress Notes (Signed)
Summary: ? about allopurinal  Phone Note Call from Patient Call back at Home Phone [phone]   Caller: Patient Summary of Call: Recieved via email I hate to keep bothering you, and understand if you would prefer me making an appointment with my issues.  I have been on 300 mg of Allipurinal for 2-3 weeks now, and have noted gout pain coming back each day, near the end of the dosage period.  Can that dosage be increased to alleviate the pain?  Also, let Sr. Panosh know I reduces my Metformin 500 HCL to one tablet a day, as I was beginning to experience hypoglycemia near the end if the daily dosage.  I will monitor my blood sugars and adjust as necessary.  Initial call taken by: Romualdo Bolk, CMA Duncan Dull),  January 01, 2010 12:47 PM  Follow-up for Phone Call        you can increase to 400 mg per day  .  after a week    we should get a uric acid level and bmp done if not done elsewhere   before june   dx gout. Follow-up by: Madelin Headings MD,  January 02, 2010 8:45 AM  Additional Follow-up for Phone Call Additional follow up Details #1::        Emailed pt about this. Additional Follow-up by: Romualdo Bolk, CMA (AAMA),  January 02, 2010 10:15 AM

## 2010-10-15 NOTE — Miscellaneous (Signed)
   Clinical Lists Changes  Medications: Removed medication of ALLOPURINOL 100 MG TABS (ALLOPURINOL) take one by mouth once daily for a week  increase  to 2 by mouth once daily after a week and 3 by mouth once daily for a week

## 2010-10-15 NOTE — Miscellaneous (Signed)
      Allergies Added: ! ASA Clinical Lists Changes  Medications: Removed medication of ADULT ASPIRIN LOW STRENGTH 81 MG  TBDP (ASPIRIN) Take 1 tablet by mouth once a day Allergies: Added new allergy or adverse reaction of ASA Observations: Added new observation of NKA: F (06/17/2010 15:57)

## 2010-10-15 NOTE — Assessment & Plan Note (Signed)
Summary: 3 MTH ROV (F/U ON LABWRK) // RS pt rsc/njr   Vital Signs:  Patient profile:   50 year old male Weight:      221.5 pounds BMI:     31.90 Pulse rate:   66 / minute BP sitting:   110 / 70  (right arm) Cuff size:   regular  Vitals Entered By: Romualdo Bolk, CMA (AAMA) (March 04, 2010 1:39 PM) CC: Follow-up visit on labs   History of Present Illness: Hector [name] comes in today  for follow up  for multiple medical problems . Since his gastric BYpass he has improved in many ways .  Has been off   all diabetic meds since April   low sugars    as he lost weight.  Has altered diet.    some intolerances  but adjusting .   Sleep better and energy    still using cpap.  LIPIDS  Stopped   simvastatin  ? if from sleep  and other  no asthma . last gout  ? 1 day a month ago. otherwise  quiescent.  stopped  allopurinol about April. Mood still on med doing ok   Preventive Screening-Counseling & Management  Alcohol-Tobacco     Alcohol drinks/day: <1     Smoking Status: never  Caffeine-Diet-Exercise     Caffeine use/day: 1     Does Patient Exercise: no     Type of exercise: Walking     Times/week: 3  Current Medications (verified): 1)  Indomethacin 50 Mg Caps (Indomethacin) .[name].. 1 Po Qd As Needed 2)  Lexapro 10 Mg Tabs (Escitalopram Oxalate) .... 3 By Mouth Once Daily 3)  Adult Aspirin Low Strength 81 Mg  Tbdp (Aspirin) .... Take 1 Tablet By Mouth Once A Day 4)  Albuterol 90 Mcg/act  Aers (Albuterol) .... Inhale 2 Puffs Every 4 Hrs As Needed 5)  Centrum Silver  Chew (Multiple Vitamins-Minerals) .[name].. 1 Two Times A Day 6)  Methocarbamol 750 Mg Tabs (Methocarbamol) .... Per Dr [name] 7)  Co-Gesic 5-500 Mg Tabs (Hydrocodone-Acetaminophen) .... Per Dr [name] 8)  Amitriptyline Hcl 10 Mg Tabs (Amitriptyline Hcl) .... Take 2-3 By Mouth Qhs For Sleep 9)  Calcium Citrate +  Tabs (Multiple Minerals-Vitamins) 10)  Cyanocobalamin 2500 Mcg Subl (Cyanocobalamin) 11)  Allopurinol 100 Mg Tabs  (Allopurinol) .... Take One By Mouth Once Daily For A Week  Increase  To 2 By Mouth Once Daily After A Week and 3 By Mouth Once Daily For A Week  Allergies (verified): No Known Drug Allergies  Past History:  Past medical, surgical, family and social histories (including risk factors) reviewed, and no changes noted (except as noted below).  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 Family History of CAD Male 1st degree relative <50 Family History High cholesterol Family History Hypertension Family History of Liver Disease/Cirrhosis:Maternal uncle due to alcohol died age 37 Family History of Uterine Cancer:mother Family History of Colon Cancer:paternal aunt, maternal uncle Family History of Lung Cancer: Maternal grandmother, paternal grandmother Family History of Diabetes: pateranl uncles, paternal aunts Family History of  Heart Disease: materanl grandfather,father  Social History: Reviewed history from 06/19/2009 and no changes required. 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    Physical Exam  General:  alert, well-developed, well-nourished, and well-hydrated.   Head:  normocephalic, atraumatic, and no abnormalities observed.   Eyes:  PERRL, EOMs full, conjunctiva clear  Ears:  R ear normal and L ear normal.   Nose:  no external deformity and no external erythema.   Mouth:  pharynx pink and moist.   Neck:  No deformities, masses, or tenderness noted. Lungs:  Normal respiratory effort, chest expands symmetrically. Lungs are clear to auscultation, no crackles or wheezes. Heart:  Normal rate and regular rhythm. S1 and S2 normal without gallop, murmur, click, rub or other extra sounds. Abdomen:  soft, non-tender, no hepatomegaly, and no splenomegaly.  healing scars no hernia   Pulses:  pulses intact without delay   Extremities:  no clubbing cyanosis or edema  Neurologic:  alert & oriented X3, strength normal in all extremities, sensation intact to light touch, and gait normal.    Diabetes Management Exam:    Foot Exam (with socks and/or shoes not present):       Sensory-Monofilament:          Left foot: normal          Right foot: normal       Inspection:          Left foot: normal          Right foot: normal   Impression & Recommendations:  Problem # 1:  DIABETES MELLITUS, TYPE II (ICD-250.00) Assessment Improved aic is below 6 and no longer in the diabetic range      continue  off  insulin The following medications were removed from the medication list:    Glucophage Xr 500 Mg Xr24h-tab (Metformin hcl) .[name]... 2 once daily by mouth    Humalog Kwikpen 100 Unit/ml Soln (Insulin lispro (human)) ..... Use as directed His updated medication list for this problem includes:    Adult Aspirin Low Strength 81 Mg Tbdp (Aspirin) .[name]... Take 1 tablet by mouth once a day  Labs Reviewed: Creat: 1.1 (02/18/2010)     Last Eye Exam: normal (01/13/2009) Reviewed HgBA1c results: 5.7 (02/18/2010)  6.1 (11/19/2009)  Problem # 2:  BARIATRIC SURGERY STATUS  (ICD-V45.86) has helped with all metabolic factors  in a motivated individual has lost over 45  pounds so far  and feels better.  His bmi is now 31.9  and began over 40 .  Problem # 3:  SLEEP APNEA (ICD-780.57) Assessment: Comment Only  Problem # 4:  GOUT (ICD-274.9) Assessment: Improved right foot some soreness .  prob better since weight loss   bot continuing problem  to maintain on meds  uric acid is 7.1 and not at goal  yet . plt cout borderline will follow  His updated medication list for this problem includes:    Indomethacin 50 Mg Caps (Indomethacin) .[name]... 1 po qd as needed    Allopurinol 100 Mg Tabs (Allopurinol) .[name]... Take one by mouth once daily for a week  increase  to 2 by mouth once daily after a  week and 3 by mouth once daily for a week  Problem # 5:  OBSTRUCTIVE SLEEP APNEA (ICD-327.23)  Problem # 6:  HYPERLIPIDEMIA NEC/NOS (ICD-272.4) will still reconsider  med at some time in the future but      his labs show "no diabetes"   range on lifestyle intervention alone. The following medications were removed from the medication list:    Simvastatin 20 Mg Tabs (Simvastatin) .[name]... 1  by mouth  by mouth once daily  Problem # 7:  B12 DEFICIENCY (ICD-266.2) need to recheck at next lab.    Problem # 8:  TRANSAMINASES, SERUM, ELEVATED FATY LIVER (ICD-790.4) Assessment: Improved has normalized last check   Problem # 9:  OBESITY (ICD-278.00) Assessment: Improved dowin well after by pass.   counseled and follow up with bariatric program as he is doing.  Problem # 10:  ADJ DISORDER WITH MIXED ANXIETY & DEPRESSED MOOD (ICD-309.28) Assessment: Improved on med      Complete Medication List: 1)  Indomethacin 50 Mg Caps (Indomethacin) .[name].. 1 po qd as needed 2)  Lexapro 10 Mg Tabs (Escitalopram oxalate) .... 3 by mouth once daily 3)  Adult Aspirin Low Strength 81 Mg Tbdp (Aspirin) .... Take 1 tablet by mouth once a day 4)  Albuterol 90 Mcg/act Aers (Albuterol) .... Inhale 2 puffs  every 4 hrs as needed 5)  Centrum Silver Chew (Multiple vitamins-minerals) .[name].. 1 two times a day 6)  Methocarbamol 750 Mg Tabs (Methocarbamol) .... Per dr bean 7)  Co-gesic 5-500 Mg Tabs (Hydrocodone-acetaminophen) .... Per dr bean 8)  Amitriptyline Hcl 10 Mg Tabs (Amitriptyline hcl) .... Take 2-3 by mouth qhs for sleep 9)  Calcium Citrate + Tabs (Multiple minerals-vitamins) 10)  Cyanocobalamin 2500 Mcg Subl (Cyanocobalamin) 11)  Allopurinol 100 Mg Tabs (Allopurinol) .... Take one by mouth once daily for a week  increase  to 2 by mouth once daily after a week and 3 by mouth once daily for a week  Patient Instructions: 1)  ok to stay off meds for now. 2)  if gout flaring need  uric acid less than 6.0   3)  Also will need to recheck your lipids at next blood tests.  4)  Continue sleep apnea    monitoring.  5)  Hepatic Panel prior to visit ICD-9:  6)  Lipid panel prior to visit ICD-9 :  7)  CBC w/ Diff prior to visit ICD-9 :  8)  HgBA1c prior to visit  ICD-9:  9)  Vit B12 level 10)  Please schedule a follow-up appointment in 3 months .  or as needed.

## 2010-10-15 NOTE — Letter (Signed)
Summary: [facility]   Imported By: Maryln Gottron 11/07/2009 15:23:23  _____________________________________________________________________  External Attachment:    Type:   Image     Comment:   External Document

## 2010-10-15 NOTE — Letter (Signed)
Summary: CMN request for CPAP Supplies/[facility]  CMN request for CPAP Supplies/[facility]   Imported By: Sherian Rein 02/21/2010 09:07:33  _____________________________________________________________________  External Attachment:    Type:   Image     Comment:   External Document

## 2010-10-15 NOTE — Letter (Signed)
Summary: [facility]   Imported By: Maryln Gottron 12/05/2009 10:32:29  _____________________________________________________________________  External Attachment:    Type:   Image     Comment:   External Document

## 2010-10-15 NOTE — Progress Notes (Signed)
Summary: Medically Supervised Weight Loss Documentation  Medically Supervised Weight Loss Documentation   Imported By: Maryln Gottron 09/26/2009 15:25:50  _____________________________________________________________________  External Attachment:    Type:   Image     Comment:   External Document

## 2010-10-15 NOTE — Progress Notes (Signed)
Summary: email from Hector [name] regarding reducing his insulin  Phone Note Call from Patient Call back at [facility] Phone [phone]   Caller: patient via email Summary of Call: I have copied and pasted his message into the phone note.    Carollee Herter,     I met with the Nutritionist on Friday, prior to beginning the 2-week Pre-Surgical Diet.  Amy Bridges, the Nutritionist with [facility], indicated I would need to reduce my Insulin intake during the Pre-Surgical Diet.  I had spoken to Dr. Fabian Sharp about this at my last visit, and Dr. Fabian Sharp and I agreed to reduce my Insulin intake 30% - 50% during the Pre-Surgical Diet, as to not put me into a Hypoglycemic state.  I wanted to confirm that is still OK with Dr. Fabian Sharp.  In my opinion, it would be better to reduce to 50%, and watch my Blood Sugar closely, increasing as needed, versus reducing to 30% and taking a chance on becoming Hypoglycemic.  Also, Post-Operatively, if needed, I will need to have my prescription for Metformin changed to the Non-HCL variety, as I will have to crush my Metformin before taking.  Depending on how my body responds to the surgery, I may not need any Diabetic Meds.  We will have to see.  Please confirm with Dr. Fabian Sharp regarding the Insulin and let me know.     Thanks,     Hector [name]  Initial call taken by: Romualdo Bolk, CMA (AAMA),  October 22, 2009 10:27 AM  Follow-up for Phone Call        50 % reduction in insulin is good to start .  ( there is a  liquid form  of metformin  but probably costs more)  Follow-up by: Madelin Headings MD,  October 22, 2009 2:34 PM  Additional Follow-up for Phone Call Additional follow up Details #1::        Pt aware and will email [place] if he has any futher questions. Additional Follow-up by: Romualdo Bolk, CMA (AAMA),  October 22, 2009 2:38 PM

## 2010-10-15 NOTE — Assessment & Plan Note (Signed)
Summary: 3 MTH ROV // RS rsc per doc/njr   Vital Signs:  Patient profile:   50 year old male Height:      70 inches Weight:      210 pounds BMI:     30.24 Pulse rate:   66 / minute BP sitting:   120 / 60  (right arm) Cuff size:   regular  Vitals Entered By: Romualdo Bolk, CMA (AAMA) (May 24, 2010 10:48 AM) CC: Follow-up visit on labs   History of Present Illness: Hector [name]  comes in today  for  follow up of multiple medical problems . He continues to lose weight after  gastric bypass and feels well with more energy    Back  getting epidural  injectin to treated  left.   still doing elliptcal    and outside  DM:    checking BG  ocassional weekly and usually low 100      few low bg     ? diet related.    Weight doing well .    Multivits q day.   gunmmi vitamins  and calcium and b12 .  hasnt had the 3 months follow up yet.  OSA  : has appt with Dr Merceda Elks  .   ? to do repeat sleep. study.    thinks things are better . goal weight  to be   182   he sees .fu soon. .     Preventive Screening-Counseling & Management  Caffeine-Diet-Exercise     Caffeine use/day: 1     Does Patient Exercise: no     Type of exercise: Walking     Times/week: 3  Current Medications (verified): 1)  Indomethacin 50 Mg Caps (Indomethacin) .[name].. 1 Po Qd As Needed 2)  Lexapro 10 Mg Tabs (Escitalopram Oxalate) .... 3 By Mouth Once Daily 3)  Adult Aspirin Low Strength 81 Mg  Tbdp (Aspirin) .... Take 1 Tablet By Mouth Once A Day 4)  Albuterol 90 Mcg/act  Aers (Albuterol) .... Inhale 2 Puffs Every 4 Hrs As Needed 5)  Centrum Silver  Chew (Multiple Vitamins-Minerals) .[name].. 1 Two Times A Day 6)  Methocarbamol 750 Mg Tabs (Methocarbamol) .... Per Dr [name] 7)  Co-Gesic 5-500 Mg Tabs (Hydrocodone-Acetaminophen) .... Per Dr [name] 8)  Amitriptyline Hcl 10 Mg Tabs (Amitriptyline Hcl) .... Take 2-3 By Mouth Qhs For Sleep 9)  Calcium Citrate +  Tabs (Multiple Minerals-Vitamins) 10)  Cyanocobalamin 2500 Mcg  Subl (Cyanocobalamin)  Allergies (verified): No Known Drug Allergies  Past History:  Past medical, surgical, family and social histories (including risk factors) reviewed, and no changes noted (except as noted below).  Past Medical History: Reviewed history from 03/04/2010 and no changes required. 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 Ethelene Hal  Past History:  Care Management: Orthopedics: [facility] Gastroenterology: K[facility] Scales (in past)/[facility]: [facility]  Family History: Reviewed history from 06/19/2009 and no changes required. Family History of CAD Male 1st degree relative <50 Family History High cholesterol Family History Hypertension Family History of Liver Disease/Cirrhosis:Maternal uncle due to alcohol died age 60 Family History of Uterine Cancer:mother Family History of Colon Cancer:paternal aunt, maternal uncle Family History of  Lung Cancer: Maternal grandmother, paternal grandmother Family History of Diabetes: pateranl uncles, paternal aunts Family History of Heart Disease: materanl grandfather,father  Social History: Reviewed history from 06/19/2009 and no changes required. 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    Physical Exam  General:  Well-developed,well-nourished,in no acute distress; alert,appropriate and cooperative throughout  examination Head:  normocephalic and atraumatic.   Eyes:  vision grossly intact.   Neck:  No deformities, masses, or tenderness noted. Lungs:  Normal respiratory effort, chest expands symmetrically. Lungs are clear to auscultation, no crackles or wheezes. Heart:  Normal rate and regular rhythm. S1 and S2 normal without gallop, murmur, click, rub or other extra sounds. Abdomen:  well healed scars no hepatomegaly and no splenomegaly.   Msk:  no joint swelling and no joint warmth.   Pulses:  pulses intact without delay   Neurologic:  alert & oriented X3 and gait normal.   Skin:  turgor normal, no ecchymoses, no petechiae, and no purpura.   Cervical Nodes:  No lymphadenopathy noted Psych:  Oriented X3, normally interactive, good eye contact, not anxious appearing, and not depressed appearing.    Diabetes Management Exam:    Eye Exam:       Eye Exam done elsewhere          Date: 10/16/2009          Results: diabetic retinopathy          Done by: Dr. Elmer Picker- Touch of diabetic retinopathy in rt eye but should go away on it's on   Impression & Recommendations:  Problem # 1:  DIABETES MELLITUS, TYPE II (ICD-250.00) Assessment Improved  quiescent from weight loss   and bypass    May 24, 2010 11:28 AM  His updated medication list for this problem includes:    Adult Aspirin Low Strength 81 Mg Tbdp (Aspirin) .[name]... Take 1 tablet by mouth once a day  Labs Reviewed: Creat: 1.1 (02/18/2010)     Last Eye Exam: diabetic retinopathy (10/16/2009) Reviewed HgBA1c results: 6.3 (05/17/2010)  5.7 (02/18/2010)  Problem # 2:  BACK PAIN (ICD-724.5)  under care per  Dr [name]   with left leg signs  His updated medication list for this problem includes:    Indomethacin 50 Mg Caps (Indomethacin) .[name]... 1 po qd as needed    Adult Aspirin Low Strength 81 Mg Tbdp (Aspirin) .[name]... Take 1 tablet by mouth once a day    Methocarbamol 750 Mg Tabs (Methocarbamol) .[name]... Per dr bean    Co-gesic 5-500 Mg Tabs  (Hydrocodone-acetaminophen) .[name]... Per dr bean  Problem # 3:  THROMBOCYTOPENIA (ICD-287.5) mild over last few times  but persistent unclear cause no signs and no bleeding  could have been meds allopurinaol?   but on very few now.   no HS megaly  . will repeat at next lab draw.      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 1 po qd as needed  Problem # 7:  B12 DEFICIENCY (ICD-266.2) Assessment: Improved  Complete Medication List: 1)  Indomethacin 50 Mg Caps (Indomethacin) .[name].. 1 po qd as needed 2)  Lexapro 10 Mg Tabs (Escitalopram oxalate) .... 3 by mouth once daily 3)  Adult Aspirin Low Strength 81 Mg Tbdp (Aspirin) .... Take 1 tablet by mouth once a day 4)  Albuterol 90 Mcg/act Aers (Albuterol) .... Inhale 2 puffs every 4 hrs as needed 5)  Centrum Silver Chew (Multiple vitamins-minerals) .[name].. 1 two times a day 6)  Methocarbamol 750 Mg Tabs (Methocarbamol) .... Per dr bean 7)  Co-gesic 5-500 Mg Tabs (Hydrocodone-acetaminophen) .... Per dr bean 8)  Amitriptyline Hcl 10 Mg Tabs (Amitriptyline hcl) .... Take 2-3 by mouth qhs for sleep 9)  Calcium Citrate + Tabs (Multiple minerals-vitamins) 10)  Cyanocobalamin 2500 Mcg Subl (Cyanocobalamin)  Patient Instructions: 1)  CBC w/ Diff prior to visit ICD-9 :  2)  HgBA1c prior to visit  ICD-9:  3)  Please schedule a follow-up appointment in 4 months .

## 2010-10-15 NOTE — Progress Notes (Signed)
Summary: d/c lantus  Phone Note Call from Patient Call back at Home Phone [phone]   Caller: Patient Summary of Call: Pt sent a email saying that he has discontinued the use of Lantus Insulin each night as he was experiencing Hypoglycemia symptoms during the day. He decreased it gradually as his weight continued to drop. He still takes 2 Metformin 500 mg qam but hopes to discontinue it as well in time, as he reaches his target weight goal of 190. This am, he weighed 225lbs down from his initial wt of 285. Initial call taken by: Romualdo Bolk, CMA (AAMA),  December 28, 2009 12:55 PM  Follow-up for Phone Call        this is fine . Keep [place] informed  taken off med list  Follow-up by: Madelin Headings MD,  December 28, 2009 5:54 PM  Additional Follow-up for Phone Call Additional follow up Details #1::        Pt aware. Additional Follow-up by: Romualdo Bolk, CMA Duncan Dull),  December 31, 2009 12:39 PM

## 2010-10-15 NOTE — Assessment & Plan Note (Signed)
Summary: 1 MO ROV/MM   Vital Signs:  Patient profile:   50 year old male Height:      70 inches Weight:      274 pounds BMI:     39.46 Pulse rate:   78 / minute BP sitting:   120 / 80  (right arm) Cuff size:   large  Vitals Entered By: Romualdo Bolk, CMA (AAMA) (September 24, 2009 7:58 AM) CC: Medically Supervised Weight Loss   History of Present Illness: Hector [name] comesin today for   monitoring of disease states and morbid obestity  interventions. Since last visit  here  there have been no major changes in health status  . He feels more energetic exercising 5 x per week supervised.  continuing 1800 diet  change and seeing dietician regularly in prep for  bypass surgery. Needs 2 forms completed related  to this.  No vision   changes and no numbness.   Back somewhat problematic . May be a candidate for surgery a tsome point . has  facet joint and foramenal pain. Gout : no flares  OSA : no change DM 50 units insulin per day.   last b g 103  no lows  .   continuing metformin. LIPIDs: last lipid med was   liptor and we stopped it because he had a bump in lfts and sugar ( not incontrol then)   Mood good.  Preventive Screening-Counseling & Management  Alcohol-Tobacco     Alcohol drinks/day: <1     Smoking Status: never  Caffeine-Diet-Exercise     Caffeine use/day: 1     Does Patient Exercise: no     Type of exercise: Walking     Times/week: 3  Current Medications (verified): 1)  Indomethacin 50 Mg Caps (Indomethacin) .[name].. 1 Po Qd As Needed 2)  Lexapro 10 Mg Tabs (Escitalopram Oxalate) .... 3 By Mouth Once Daily 3)  Glucophage Xr 500 Mg  Xr24h-Tab (Metformin Hcl) .... 2 Once Daily By Mouth 4)  Bd Ultra-Fine Pen Needles 29g X 12.12mm  Misc (Insulin Pen Needle) .... Use As Directed 5)  Adult Aspirin Low Strength 81 Mg  Tbdp (Aspirin) .... Take 1 Tablet By Mouth Once A Day 6)  Albuterol 90 Mcg/act  Aers (Albuterol) .... Inhale 2 Puffs Every 4 Hrs As Needed 7)  Gnc Sport  Multivitamin .... Once Daily 8)  Lantus Solostar 100 Unit/ml  Soln (Insulin Glargine) .... 50 Units Per Night or As Directed With Pen Needles 9)  Humalog Kwikpen 100 Unit/ml  Soln (Insulin Lispro (Human)) .... Use As Directed 10)  Methocarbamol 750 Mg Tabs (Methocarbamol) .... Per Dr [name] 11)  Co-Gesic 5-500 Mg Tabs (Hydrocodone-Acetaminophen) .... Per Dr [name] 12)  Amitriptyline Hcl 10 Mg Tabs (Amitriptyline Hcl) .... Take 2-3 By Mouth Qhs For Sleep 13)  Glucosamine-Chondroitin   Caps (Glucosamine-Chondroit-Vit C-Mn)  Allergies (verified): No Known Drug Allergies  Past History:  Past medical, surgical, family and social histories (including risk factors) reviewed, and no changes noted (except as noted below).  Past Medical History: Reviewed history from 10/20/2008 and no changes required. Diabetes mellitus, type II Hyperlipidemia Abnormal Lfts fatty liver Hx of concussion Gout cardiolite 1/07 normal CONSULTANTS  Gerarda Fraction  Past Surgical History: Reviewed history from 04/11/2008 and no changes required. umbilical hernia repair VASECTOMY NASAL SEPTOPLASTY X 2  Family History: Reviewed history from 06/19/2009 and no changes required. Family History of CAD Male 1st degree relative <50 Family History High cholesterol Family History Hypertension Family  History of Liver Disease/Cirrhosis:Maternal uncle due to alcohol died age 60 Family History of Uterine Cancer:mother Family History of Colon Cancer:paternal aunt, maternal uncle Family History of Lung Cancer: Maternal grandmother, paternal grandmother Family History of Diabetes: pateranl uncles, paternal aunts Family History of Heart Disease: materanl grandfather,father  Social History: Reviewed history from 06/19/2009 and no changes required. 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    Physical Exam  General:  Well-developed,well-nourished,in no acute distress; alert,appropriate and cooperative throughout examination Head:  normocephalic and atraumatic.   Eyes:  vision grossly intact.   Neck:  No deformities, masses, or tenderness noted. Lungs:  Normal respiratory effort, chest expands symmetrically. Lungs are clear to auscultation, no crackles or wheezes.no dullness.   Heart:  Normal rate and regular rhythm. S1 and S2 normal without gallop, murmur, click, rub or other extra sounds.no lifts.   Abdomen:  Bowel sounds positive,abdomen soft and non-tender without masses, organomegaly or hernias noted. well healed umbi hernia scar  Pulses:  nl cap refill on bruits  Neurologic:  alert & oriented X3, strength normal in all extremities, and gait normal.   Skin:  turgor normal, color normal, no ecchymoses, and no petechiae.   Cervical Nodes:  No lymphadenopathy noted Psych:  Oriented X3, not anxious appearing, and not depressed appearing.     Impression & Recommendations:  Problem # 1:  DIABETES MELLITUS, TYPE II (ICD-250.00) continue lifestyle intervention   pre surgery diet may require   lowering insulin  at that time His updated medication list for this problem includes:    Glucophage Xr 500 Mg Xr24h-tab (Metformin hcl) .[name]... 2 once daily by mouth    Adult Aspirin Low Strength 81 Mg Tbdp (Aspirin) .[name]... Take 1 tablet by mouth once a day    Lantus Solostar 100 Unit/ml Soln (Insulin glargine) .Marland KitchenMarland KitchenMarland KitchenMarland Kitchen 50 units per night or as directed with pen needles    Humalog Kwikpen 100 Unit/ml Soln (Insulin lispro (human)) ..... Use as  directed  Labs Reviewed: Creat: 1.2 (05/11/2009)     Last Eye Exam: normal (01/13/2009) Reviewed HgBA1c results: 7.6 (08/08/2009)  7.9 (05/11/2009)  Problem # 2:  BODY MASS INDEX 40 AND OVER ADULT (ICD-V85.4) some weight    Problem # 3:  HYPERLIPIDEMIA (ICD-272.4) will retry med again and monitor His updated medication list for this problem includes:    Simvastatin 20 Mg Tabs (Simvastatin) .[name]... 1  by mouth  by mouth once daily  Problem # 4:  BACK PAIN (ICD-724.5) seeing ortho  His updated medication list for this problem includes:    Indomethacin 50 Mg Caps (Indomethacin) .[name]... 1 po qd as needed    Adult Aspirin Low Strength 81 Mg Tbdp (Aspirin) .[name]... Take 1 tablet by mouth once a day    Methocarbamol 750 Mg Tabs (Methocarbamol) .[name]... Per dr bean    Co-gesic 5-500 Mg Tabs (Hydrocodone-acetaminophen) .[name]... Per dr bean  Problem # 5:  TRANSAMINASES, SERUM, ELEVATED FATY LIVER (ICD-790.4) will follow  2 forms completed   and given to patient.  Complete Medication List: 1)  Indomethacin 50 Mg Caps (Indomethacin) .[name].. 1 po qd as needed 2)  Lexapro 10 Mg Tabs (Escitalopram oxalate) .... 3 by mouth once daily 3)  Glucophage Xr 500 Mg Xr24h-tab (Metformin hcl) .... 2 once daily by mouth 4)  Bd Ultra-fine Pen Needles 29g X 12.45mm Misc (Insulin pen needle) .... Use as directed 5)  Adult Aspirin Low Strength 81 Mg Tbdp (Aspirin) .... Take 1 tablet by mouth once a day 6)  Albuterol 90 Mcg/act Aers (Albuterol) .... Inhale 2 puffs every 4 hrs as needed 7)  Gnc Sport Multivitamin  .... Once daily 8)  Lantus Solostar 100 Unit/ml Soln (Insulin glargine) .... 50 units per night or as directed with pen needles 9)  Humalog Kwikpen 100 Unit/ml Soln (Insulin lispro (human)) .... Use as directed 10)  Methocarbamol 750 Mg Tabs (Methocarbamol) .... Per dr bean 11)  Co-gesic 5-500 Mg Tabs (Hydrocodone-acetaminophen) .... Per dr bean 12)  Amitriptyline Hcl 10 Mg Tabs (Amitriptyline hcl) .... Take 2-3  by mouth qhs for sleep 13)  Glucosamine-chondroitin Caps (Glucosamine-chondroit-vit c-mn) 14)  Simvastatin 20 Mg Tabs (Simvastatin) .[name].. 1  by mouth  by mouth once daily  Patient Instructions: 1)  begin on  simvastatin for your lipids 2)  plan   labs in 6-8 weeks   lipids and lfts and hg a1c  3)  Continue  exercise and  dietary changes . 4)  return office visit after this or as needed.  Prescriptions: SIMVASTATIN 20 MG TABS (SIMVASTATIN) 1  by mouth  by mouth once daily  #30 x 3   Entered and Authorized by:   Madelin Headings MD   Signed by:   Madelin Headings MD on 09/24/2009   Method used:   Electronically to        [facility]  [address] [id number] (retail)       [address]       [place], [place]  [address]       Ph: [phone] or [phone]       Fax: [phone]   RxID:   [id number]

## 2010-10-15 NOTE — Letter (Signed)
Summary: [facility]   Imported By: Maryln Gottron 07/29/2010 13:54:35  _____________________________________________________________________  External Attachment:    Type:   Image     Comment:   External Document

## 2010-10-17 NOTE — Assessment & Plan Note (Signed)
Summary: 4 month rov/njr/pt rescd from [facility]//[facility]   Vital Signs:  Patient profile:   50 year old male Weight:      208 pounds BMI:     29.95 Pulse rate:   88 / minute BP sitting:   122 / 86  (right arm) Cuff size:   regular  Vitals Entered By: Romualdo Bolk, CMA (AAMA) (October 07, 2010 12:42 PM) CC: Follow-up visit on labs   History of Present Illness: Hector [name] comes in today  for   return office visit  for multiple medical problems  Since last visit he has been hospitalized for    marginal ulcer at surgical site and had recent ecndosopy .[name] now off all DiABETES meds and  the prevacid . Had  shot of cortisone  in back   and ? if related.   no longer on nsaids asa  .  Psych: seeing psych and no one  lexapro and lamictal. Dm: better  no longer on meds or asa.   Under eval  of back         problem with sciatica  .  No bleeding bruising has had cold in therecnet past.                                                           Preventive Screening-Counseling & Management  Alcohol-Tobacco     Alcohol drinks/day: <1     Smoking Status: never  Caffeine-Diet-Exercise     Caffeine use/day: 1     Does Patient Exercise: no     Type of exercise: Walking     Times/week: 3  Current Medications (verified): 1)  Lexapro 10 Mg Tabs (Escitalopram Oxalate) .... 3 By Mouth Once Daily 2)  Albuterol 90 Mcg/act  Aers (Albuterol) .... Inhale 2 Puffs Every 4 Hrs As Needed 3)  Adult One Daily Gummies  Chew (Multiple Vitamins-Minerals) 4)  Methocarbamol 750 Mg Tabs (Methocarbamol) .... Per Dr [name] 5)  Co-Gesic 5-500 Mg Tabs (Hydrocodone-Acetaminophen) .... Per Dr [name] 6)  Amitriptyline Hcl 10 Mg Tabs (Amitriptyline Hcl) .... Take 2-3 By Mouth Qhs For Sleep 7)  Calcium Citrate +  Tabs (Multiple Minerals-Vitamins) 8)  Cyanocobalamin 2500 Mcg Subl (Cyanocobalamin) 9)  Lamictal 100 Mg Tabs (Lamotrigine) .[name].. 1 By Mouth Once Daily  Allergies (verified): 1)  ! Asa  Past History:  Past medical,  surgical, family and social histories (including risk factors) reviewed, and no changes noted (except as noted below).  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 Ethelene Hal Surgerical repair of stomach ulcer 06/08/2010 pef  Past History:  Care Management: Orthopedics: [facility] Gastroenterology: Kinnie Scales (in past)/[facility] Surgery: [facility] Psychiatry: Eugenia Mcalpine  Family History: Reviewed history from 06/19/2009 and no changes required. Family History of CAD Male 1st degree relative <50 Family History High cholesterol Family History Hypertension Family History of Liver Disease/Cirrhosis:Maternal uncle due to alcohol died age 50 Family History of Uterine Cancer:mother Family History of  Colon Cancer:paternal aunt, maternal uncle Family History of Lung Cancer: Maternal grandmother, paternal grandmother Family History of Diabetes: pateranl uncles, paternal aunts Family History of Heart Disease: materanl grandfather,father  Social History: Reviewed history from 06/19/2009 and no changes required. Married Never Smoked Regular exercise-no employed Patent examiner and investigation   Alcohol Use - yes socially Illicit Drug Use - no  Review of Systems  The patient denies anorexia, fever, weight gain, vision loss, decreased hearing, hoarseness, chest pain, syncope, dyspnea on exertion, peripheral edema, prolonged cough, hemoptysis, melena, hematochezia, severe indigestion/heartburn, hematuria, muscle weakness, depression, unusual weight change, abnormal bleeding, enlarged lymph nodes, and  angioedema.    Physical Exam  General:  alert, well-developed, well-nourished, and well-hydrated.   Head:  normocephalic and atraumatic.   Eyes:  vision grossly intact, pupils equal, and pupils round.   Ears:  R ear normal and L ear normal.   Neck:  No deformities, masses, or tenderness noted. Lungs:  Normal respiratory effort, chest expands symmetrically. Lungs are clear to auscultation, no crackles or wheezes. Heart:  Normal rate and regular rhythm. S1 and S2 normal without gallop, murmur, click, rub or other extra sounds. Abdomen:  well healed scars  no tendernessno rigidity, no rebound tenderness, no hepatomegaly, and no splenomegaly.   Msk:  no joint warmth and no redness over joints.   Pulses:  pulses intact without delay   Extremities:  no clubbing cyanosis or edema  Neurologic:  alert & oriented X3 and gait normal.  non focal  Skin:  turgor normal, color normal, and no petechiae.   Cervical Nodes:  No lymphadenopathy noted Psych:  Oriented X3, normally interactive, good eye contact, not anxious appearing, and not depressed appearing.     Impression & Recommendations:  Problem # 1:  DIABETES MELLITUS, TYPE II (ICD-250.00) Assessment Improved  resolved with no evidene of such with weight loss  Labs Reviewed: Creat: 1.1 (02/18/2010)     Last Eye Exam: diabetic retinopathy (10/16/2009) Reviewed HgBA1c results: 6.0 (09/20/2010)  6.3 (05/17/2010)  Problem # 2:  ADJ DISORDER WITH MIXED ANXIETY & DEPRESSED MOOD (ICD-309.28) Assessment: Improved on meds pser psych doing better  Problem # 3:  OBSTRUCTIVE SLEEP APNEA (ICD-327.23) ? if gone after weight loss.   repeat study.   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 Using muscle relaxant  to hep sleepfor.    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 3 by mouth once daily 2)  Albuterol 90 Mcg/act Aers (Albuterol) .... Inhale 2 puffs every 4 hrs as needed 3)  Adult One Daily Gummies Chew (Multiple vitamins-minerals) 4)  Methocarbamol 750 Mg Tabs (Methocarbamol) .... Per dr bean 5)  Co-gesic 5-500 Mg Tabs (Hydrocodone-acetaminophen) .... Per dr bean 6)  Amitriptyline Hcl 10 Mg Tabs (Amitriptyline hcl) .... Take 2-3 by mouth qhs for sleep 7)  Calcium Citrate + Tabs (Multiple minerals-vitamins) 8)  Cyanocobalamin 2500 Mcg Subl (Cyanocobalamin) 9)  Lamictal 100 Mg Tabs (Lamotrigine) .[name].. 1 by mouth once daily  Patient Instructions: 1)  let us have what orders   need to be done by Dr Ezzard Standing.   2)  CPX and labs in fall  including  hg a1c    and call if need more  labs for this.  3)  call in meantime if  needed.  4)  .   Orders Added: 1)  Est. Patient Level IV [16109]

## 2010-11-01 ENCOUNTER — Other Ambulatory Visit: Payer: Self-pay | Admitting: Internal Medicine

## 2010-11-04 ENCOUNTER — Telehealth: Payer: Self-pay | Admitting: *Deleted

## 2010-11-04 NOTE — Telephone Encounter (Signed)
Ok to refill x 6 months 

## 2010-11-04 NOTE — Telephone Encounter (Signed)
Pt needs a refill on Elavil sent to [facility] [address]

## 2010-11-05 MED ORDER — AMITRIPTYLINE HCL 10 MG PO TABS: ORAL_TABLET | ORAL | Status: DC

## 2010-11-05 NOTE — Telephone Encounter (Signed)
Rx sent to pharmacy   

## 2010-11-28 LAB — DIFFERENTIAL
Basophils Absolute: 0 10*3/uL (ref 0.0–0.1)
Basophils Relative: 0 % (ref 0–1)
Eosinophils Absolute: 0 10*3/uL (ref 0.0–0.7)
Eosinophils Relative: 0 % (ref 0–5)
Eosinophils Relative: 2 % (ref 0–5)
Eosinophils Relative: 2 % (ref 0–5)
Lymphocytes Relative: 27 % (ref 12–46)
Lymphocytes Relative: 6 % — ABNORMAL LOW (ref 12–46)
Lymphocytes Relative: 9 % — ABNORMAL LOW (ref 12–46)
Lymphs Abs: 0.8 10*3/uL (ref 0.7–4.0)
Lymphs Abs: 1 10*3/uL (ref 0.7–4.0)
Lymphs Abs: 1.6 10*3/uL (ref 0.7–4.0)
Monocytes Absolute: 0.4 10*3/uL (ref 0.1–1.0)
Monocytes Relative: 3 % (ref 3–12)
Neutro Abs: 13.7 10*3/uL — ABNORMAL HIGH (ref 1.7–7.7)
Neutro Abs: 3.5 10*3/uL (ref 1.7–7.7)

## 2010-11-28 LAB — URINALYSIS, ROUTINE W REFLEX MICROSCOPIC
Glucose, UA: NEGATIVE mg/dL
Nitrite: NEGATIVE
Specific Gravity, Urine: 1.029 (ref 1.005–1.030)
pH: 5.5 (ref 5.0–8.0)

## 2010-11-28 LAB — GLUCOSE, CAPILLARY
Glucose-Capillary: 145 mg/dL — ABNORMAL HIGH (ref 70–99)
Glucose-Capillary: 169 mg/dL — ABNORMAL HIGH (ref 70–99)
Glucose-Capillary: 203 mg/dL — ABNORMAL HIGH (ref 70–99)
Glucose-Capillary: 233 mg/dL — ABNORMAL HIGH (ref 70–99)

## 2010-11-28 LAB — CBC
HCT: 39 % (ref 39.0–52.0)
HCT: 41.1 % (ref 39.0–52.0)
HCT: 49.1 % (ref 39.0–52.0)
Hemoglobin: 13.5 g/dL (ref 13.0–17.0)
Hemoglobin: 14.1 g/dL (ref 13.0–17.0)
Hemoglobin: 16.9 g/dL (ref 13.0–17.0)
MCHC: 34.5 g/dL (ref 30.0–36.0)
MCV: 93.6 fL (ref 78.0–100.0)
MCV: 94.9 fL (ref 78.0–100.0)
MCV: 95.3 fL (ref 78.0–100.0)
Platelets: 122 10*3/uL — ABNORMAL LOW (ref 150–400)
Platelets: 131 10*3/uL — ABNORMAL LOW (ref 150–400)
Platelets: 143 10*3/uL — ABNORMAL LOW (ref 150–400)
RBC: 4.1 MIL/uL — ABNORMAL LOW (ref 4.22–5.81)
RBC: 4.38 MIL/uL (ref 4.22–5.81)
RBC: 5.2 MIL/uL (ref 4.22–5.81)
RDW: 14.3 % (ref 11.5–15.5)
WBC: 12.1 10*3/uL — ABNORMAL HIGH (ref 4.0–10.5)
WBC: 15.1 10*3/uL — ABNORMAL HIGH (ref 4.0–10.5)
WBC: 15.9 10*3/uL — ABNORMAL HIGH (ref 4.0–10.5)
WBC: 5.8 10*3/uL (ref 4.0–10.5)
WBC: 7.1 10*3/uL (ref 4.0–10.5)

## 2010-11-28 LAB — LIPASE, BLOOD: Lipase: 23 U/L (ref 11–59)

## 2010-11-28 LAB — COMPREHENSIVE METABOLIC PANEL
ALT: 17 U/L (ref 0–53)
AST: 27 U/L (ref 0–37)
Alkaline Phosphatase: 71 U/L (ref 39–117)
CO2: 24 mEq/L (ref 19–32)
Calcium: 8.7 mg/dL (ref 8.4–10.5)
Chloride: 104 mEq/L (ref 96–112)
GFR calc Af Amer: >60 mL/min (ref >60)
GFR calc non Af Amer: >60 mL/min (ref >60)
Glucose, Bld: 170 mg/dL — ABNORMAL HIGH (ref 70–99)
Potassium: 4.5 mEq/L (ref 3.5–5.1)
Sodium: 137 mEq/L (ref 135–145)

## 2010-11-28 LAB — MRSA PCR SCREENING: MRSA by PCR: NEGATIVE

## 2010-11-28 LAB — BASIC METABOLIC PANEL
Chloride: 104 mEq/L (ref 96–112)
GFR calc non Af Amer: 54 mL/min — ABNORMAL LOW (ref >60)
Glucose, Bld: 204 mg/dL — ABNORMAL HIGH (ref 70–99)
Potassium: 4.7 mEq/L (ref 3.5–5.1)
Sodium: 141 mEq/L (ref 135–145)

## 2010-11-29 ENCOUNTER — Ambulatory Visit (INDEPENDENT_AMBULATORY_CARE_PROVIDER_SITE_OTHER): Payer: BC Managed Care – PPO | Admitting: Internal Medicine

## 2010-11-29 ENCOUNTER — Encounter: Payer: Self-pay | Admitting: Internal Medicine

## 2010-11-29 VITALS — BP 140/80 | HR 89 | Temp 101.3°F | Wt 205.0 lb

## 2010-11-29 DIAGNOSIS — M109 Gout, unspecified: Secondary | ICD-10-CM

## 2010-11-29 DIAGNOSIS — R6889 Other general symptoms and signs: Secondary | ICD-10-CM

## 2010-11-29 DIAGNOSIS — J029 Acute pharyngitis, unspecified: Secondary | ICD-10-CM

## 2010-11-29 DIAGNOSIS — R509 Fever, unspecified: Secondary | ICD-10-CM

## 2010-11-29 MED ORDER — HYDROCODONE-HOMATROPINE 5-1.5 MG/5ML PO SYRP: 5.0000 mL | ORAL_SOLUTION | ORAL | Status: AC | PRN

## 2010-11-29 MED ORDER — AMOXICILLIN-POT CLAVULANATE 875-125 MG PO TABS: 1.0000 | ORAL_TABLET | Freq: Two times a day (BID) | ORAL | Status: AC

## 2010-11-29 MED ORDER — INDOMETHACIN 50 MG PO CAPS: 50.0000 mg | ORAL_CAPSULE | Freq: Three times a day (TID) | ORAL | Status: DC

## 2010-11-29 NOTE — Patient Instructions (Signed)
Can use cough med for comfort and fever treatment  Rest  If HA and sinus pain not improving over the weekend then can add the antibioitc for presumed sinusitis but the original illness seems  Like a viral flu like illness.  Fever should be gone in the next 48 hours either way.

## 2010-11-30 ENCOUNTER — Encounter: Payer: Self-pay | Admitting: Internal Medicine

## 2010-11-30 NOTE — Progress Notes (Signed)
  Subjective:    Patient ID: Hector [name], male    DOB: [birth year], 50 y.o.   MRN: [id number]  HPI Patient comes in for an acute visit today accompanied by wife. He was usual state of health until three days ago when he had the onset of malaise and fatigue and then fever coughing and increasing nasal congestion and headache. He states he is on some of the nasal congestion before you develop a fever but unclear how much. He was treating the fever with over-the-counter Tylenol but is not had any today does complain of chills and malaise no real shortness of breath or hemoptysis increased thick nasal drainage bilaterally.   Also has a history of a recent gout attack and hadn't had one in a while and had taken two days of Indocin with help with like a refill on that medication.   Review of Systems Negative for chest pain shortness of breath significant wheezing nausea vomiting diarrhea urinary tract symptoms change in vision skin or neurologic changes. He did have a flu shot this year.    Objective:   Physical ExamVS reviewed This is a well-developed well-nourished mildly to moderately ill appearing in no acute distress he is alert and oriented and moderately hoarse. He has quiet respirations. HEENT: Normocephalic ;atraumatic , Eyes;  PERRL, EOMs  Full, lids and conjunctiva clear,,Ears: no deformities, canals nl, TM landmarks normal, Nose: Congested with mucoid discharge  Mouth : OP clear without lesion or edema .1+ red OP area Neck : Supple without adenopathy Chest:  Clear to A&P without wheezes rales or rhonchi CV:  S1-S2 no gallops or murmurs peripheral perfusion is normal Abdomen:  Sof,t normal bowel sounds without hepatosplenomegaly, no guarding rebound or masses no CVA tenderness  Well healed scars are noted Neuro: non focal  Skin no petechia or acute rashes      Assessment & Plan:  Fever cough headache myalgias. The faxed flulike although has some more nasal face congestion then  typical. Discussed options will treat with cough medicine fever fluids over the weekend and if he is getting increasing facial pain as a sinus infection he could begin an antibiotic. Currently no evidence of pneumonia  History of gout recent recurrence will refill his indocin and have him follow-up if recurring.

## 2010-11-30 NOTE — Assessment & Plan Note (Signed)
Basically resolved with his weight loss and lifestyle intervention from his gastric bypass

## 2010-12-01 ENCOUNTER — Other Ambulatory Visit: Payer: Self-pay | Admitting: Internal Medicine

## 2010-12-05 LAB — DIFFERENTIAL
Eosinophils Absolute: 0 10*3/uL (ref 0.0–0.7)
Eosinophils Absolute: 0.1 10*3/uL (ref 0.0–0.7)
Eosinophils Absolute: 0.2 10*3/uL (ref 0.0–0.7)
Eosinophils Relative: 0 % (ref 0–5)
Eosinophils Relative: 1 % (ref 0–5)
Lymphocytes Relative: 22 % (ref 12–46)
Lymphocytes Relative: 24 % (ref 12–46)
Lymphs Abs: 1.6 10*3/uL (ref 0.7–4.0)
Lymphs Abs: 1.9 10*3/uL (ref 0.7–4.0)
Lymphs Abs: 2.1 10*3/uL (ref 0.7–4.0)
Monocytes Absolute: 0.6 10*3/uL (ref 0.1–1.0)
Monocytes Relative: 6 % (ref 3–12)
Neutro Abs: 4.8 10*3/uL (ref 1.7–7.7)
Neutrophils Relative %: 68 % (ref 43–77)

## 2010-12-05 LAB — GLUCOSE, CAPILLARY
Glucose-Capillary: 118 mg/dL — ABNORMAL HIGH (ref 70–99)
Glucose-Capillary: 124 mg/dL — ABNORMAL HIGH (ref 70–99)
Glucose-Capillary: 126 mg/dL — ABNORMAL HIGH (ref 70–99)
Glucose-Capillary: 129 mg/dL — ABNORMAL HIGH (ref 70–99)
Glucose-Capillary: 130 mg/dL — ABNORMAL HIGH (ref 70–99)
Glucose-Capillary: 158 mg/dL — ABNORMAL HIGH (ref 70–99)

## 2010-12-05 LAB — CBC
HCT: 45.9 % (ref 39.0–52.0)
Hemoglobin: 16.2 g/dL (ref 13.0–17.0)
MCHC: 35.4 g/dL (ref 30.0–36.0)
MCV: 92.9 fL (ref 78.0–100.0)
MCV: 94.7 fL (ref 78.0–100.0)
Platelets: 138 10*3/uL — ABNORMAL LOW (ref 150–400)
Platelets: 140 10*3/uL — ABNORMAL LOW (ref 150–400)
Platelets: 146 10*3/uL — ABNORMAL LOW (ref 150–400)
RBC: 4.74 MIL/uL (ref 4.22–5.81)
RBC: 4.95 MIL/uL (ref 4.22–5.81)
RDW: 12.9 % (ref 11.5–15.5)
WBC: 8.4 10*3/uL (ref 4.0–10.5)

## 2010-12-05 LAB — COMPREHENSIVE METABOLIC PANEL
ALT: 43 U/L (ref 0–53)
AST: 39 U/L — ABNORMAL HIGH (ref 0–37)
Albumin: 4.3 g/dL (ref 3.5–5.2)
Calcium: 9.3 mg/dL (ref 8.4–10.5)
Creatinine, Ser: 1.25 mg/dL (ref 0.4–1.5)
GFR calc Af Amer: >60 mL/min (ref >60)
Sodium: 140 mEq/L (ref 135–145)
Total Protein: 8.4 g/dL — ABNORMAL HIGH (ref 6.0–8.3)

## 2010-12-05 LAB — HEMOGLOBIN AND HEMATOCRIT, BLOOD
HCT: 43.2 % (ref 39.0–52.0)
HCT: 45.6 % (ref 39.0–52.0)
Hemoglobin: 15.9 g/dL (ref 13.0–17.0)

## 2011-01-20 ENCOUNTER — Ambulatory Visit (HOSPITAL_COMMUNITY)
Admission: RE | Admit: 2011-01-20 | Discharge: 2011-01-20 | Disposition: A | Discharge status: Home / Self Care | Payer: Federal, State, Local not specified - PPO | Source: Ambulatory Visit | Attending: Specialist | Admitting: Specialist

## 2011-01-20 ENCOUNTER — Encounter (HOSPITAL_COMMUNITY): Payer: Federal, State, Local not specified - PPO

## 2011-01-20 ENCOUNTER — Other Ambulatory Visit: Payer: Self-pay | Admitting: Specialist

## 2011-01-20 ENCOUNTER — Other Ambulatory Visit (HOSPITAL_COMMUNITY): Payer: Self-pay | Admitting: Specialist

## 2011-01-20 DIAGNOSIS — Z01818 Encounter for other preprocedural examination: Secondary | ICD-10-CM

## 2011-01-20 DIAGNOSIS — Z01812 Encounter for preprocedural laboratory examination: Secondary | ICD-10-CM | POA: Insufficient documentation

## 2011-01-20 DIAGNOSIS — M48061 Spinal stenosis, lumbar region without neurogenic claudication: Secondary | ICD-10-CM | POA: Insufficient documentation

## 2011-01-20 LAB — CBC
MCH: 31 pg (ref 26.0–34.0)
MCV: 94.2 fL (ref 78.0–100.0)
Platelets: 144 10*3/uL — ABNORMAL LOW (ref 150–400)
RDW: 13.4 % (ref 11.5–15.5)
WBC: 5.9 10*3/uL (ref 4.0–10.5)

## 2011-01-20 LAB — COMPREHENSIVE METABOLIC PANEL
Alkaline Phosphatase: 107 U/L (ref 39–117)
BUN: 10 mg/dL (ref 6–23)
CO2: 31 mEq/L (ref 19–32)
Chloride: 102 mEq/L (ref 96–112)
Glucose, Bld: 95 mg/dL (ref 70–99)
Potassium: 4.4 mEq/L (ref 3.5–5.1)
Total Bilirubin: 0.4 mg/dL (ref 0.3–1.2)

## 2011-01-20 LAB — URINALYSIS, ROUTINE W REFLEX MICROSCOPIC
Bilirubin Urine: NEGATIVE
Ketones, ur: NEGATIVE mg/dL
Nitrite: NEGATIVE
Specific Gravity, Urine: 1.022 (ref 1.005–1.030)
Urobilinogen, UA: 1 mg/dL (ref 0.0–1.0)

## 2011-01-20 LAB — SURGICAL PCR SCREEN
MRSA, PCR: NEGATIVE
Staphylococcus aureus: NEGATIVE

## 2011-01-22 ENCOUNTER — Other Ambulatory Visit: Payer: Self-pay | Admitting: Specialist

## 2011-01-22 ENCOUNTER — Observation Stay (HOSPITAL_COMMUNITY)
Admission: RE | Admit: 2011-01-22 | Discharge: 2011-01-23 | Disposition: A | Discharge status: Home / Self Care | Payer: Federal, State, Local not specified - PPO | Source: Ambulatory Visit | Attending: Specialist | Admitting: Specialist

## 2011-01-22 ENCOUNTER — Ambulatory Visit (HOSPITAL_COMMUNITY): Payer: Federal, State, Local not specified - PPO

## 2011-01-22 DIAGNOSIS — M5126 Other intervertebral disc displacement, lumbar region: Secondary | ICD-10-CM | POA: Insufficient documentation

## 2011-01-22 DIAGNOSIS — G4733 Obstructive sleep apnea (adult) (pediatric): Secondary | ICD-10-CM | POA: Insufficient documentation

## 2011-01-22 DIAGNOSIS — F341 Dysthymic disorder: Secondary | ICD-10-CM | POA: Insufficient documentation

## 2011-01-22 DIAGNOSIS — Z9884 Bariatric surgery status: Secondary | ICD-10-CM | POA: Insufficient documentation

## 2011-01-22 DIAGNOSIS — M48061 Spinal stenosis, lumbar region without neurogenic claudication: Principal | ICD-10-CM | POA: Insufficient documentation

## 2011-01-22 DIAGNOSIS — Z79899 Other long term (current) drug therapy: Secondary | ICD-10-CM | POA: Insufficient documentation

## 2011-01-22 DIAGNOSIS — M549 Dorsalgia, unspecified: Secondary | ICD-10-CM | POA: Insufficient documentation

## 2011-01-28 NOTE — Assessment & Plan Note (Signed)
[facility]                             PULMONARY OFFICE NOTE   NAME:KINGRonal, Maybury                         MRN:          [id number]  DATE:08/13/2007                            DOB:          08/09/1961    SLEEP MEDICINE CONSULTATION   HISTORY OF PRESENT ILLNESS:  The patient is a 50 year old gentleman whom  I have been asked to see for obstructive sleep apnea.  The patient was  first diagnosed by Dr. Carlena Sax back in 1998 where he was found to  have an apnea/hypopnea index of 64 events per hour on a split-night  study with CPAP titration to 6 cm water pressure for control.  The  patient was placed on CPAP at that time and is currently on his third  machine.  The patient does have a heated humidifier and uses nasal  pillows.  He typically gets to bed between 8 and 9 at night and gets up  at 6 a.m. to start his day.  He has a variable level of feeling rested.  The patient has been noted by his wife to have breakthrough snoring, and  he has noted increased sleepiness and fatigue over the day over the last  1 year.  Also of note, the patient's weight has increased 30 pounds  since 1998 as well.  He has not had his pressure optimized further.   PAST MEDICAL HISTORY:  1. Significant for diabetes.  2. History of sleep apnea as stated above.  3. History of nasal septal reconstruction and UP3 by Dr. Narda Bonds      in 2007.  4. History of hernia repair.  5. History of carpal tunnel surgery.   CURRENT MEDICATIONS:  1. Glucophage 500 mg b.i.d.  2. Aspirin 81 mg daily.  3. Humalog 25 units daily.  4. Lexapro 20 mg b.i.d.  5. Multivitamin daily.  6. An albuterol inhaler p.r.n.   The patient has no known drug allergies.   SOCIAL HISTORY:  He is married and has never smoked.   FAMILY HISTORY:  Remarkable for his mother having OB/GYN cancer.  Father  and mother both had myocardial infarctions.   REVIEW OF SYSTEMS:  As per history of present  illness.  Also see the  patient intake form and documented in the chart.   PHYSICAL EXAM:  GENERAL:  The patient is an obese male in no acute  distress.  Blood pressure 130/86, pulse 92, temperature 98.2, weight 281 pounds.  He is 5 feet 10 inches tall.  O2 saturation on room air is 94%.  HEENT:  Pupils are equal, round, and reactive to light and  accommodation.  Extraocular muscles are intact.  Nares are patent  without discharge.  Oropharynx shows a nicely trimmed uvula and palate  that is well-healed.  There is no tonsillar hypertrophy.  NECK:  Supple without JVD or lymphadenopathy.  There is no palpable  thyromegaly.  CHEST:  Totally clear to auscultation.  CARDIAC:  Regular rate and rhythm.  No murmurs, rubs, or gallops.  ABDOMEN:  Soft, nontender.  Good bowel sounds.  GENITAL, RECTAL, AND BREASTS:  Not done and not indicated.  LOWER EXTREMITIES:  No edema.  Pulses are intact distally.  NEUROLOGIC:  He is alert and oriented with no observable motor defects.   IMPRESSION:  History of severe obstructive sleep apnea.  The patient is  now having breakthrough snoring, as well as increasing symptoms despite  wearing CPAP.  I am a little surprised that a pressure of 6 cm of water  ever controlled that degree of sleep apnea.  The patient obviously needs  to be re-titrated at this time, and he needs to work aggressively on  weight loss.   PLAN:  1. Will go ahead and set up for an auto-titrate device at home for the      next 2 weeks.  2. I have encouraged the patient to work aggressively on losing      weight.  3. The patient will follow up after the above.     Barbaraann Share, MD,FCCP  Electronically Signed    [facility]  DD: 08/16/2007  DT: 08/16/2007  Job #: [id number]   cc:   Neta Mends. Fabian Sharp, MD

## 2011-01-30 ENCOUNTER — Telehealth: Payer: Self-pay | Admitting: *Deleted

## 2011-01-30 MED ORDER — AMITRIPTYLINE HCL 50 MG PO TABS: 50.0000 mg | ORAL_TABLET | Freq: Every evening | ORAL | Status: DC | PRN

## 2011-01-30 NOTE — Telephone Encounter (Signed)
Pt needs a cpx with a hga1c in the fall. Pt aware and rx sent to pharmacy.

## 2011-01-30 NOTE — Telephone Encounter (Signed)
Pt sent an email saying "I have been on 30 mg of Amitriptyline for sleep for some time now.  I believe my body is adjusting to this medication and I probably need to increase the dosage as I am not sleeping as well as I did before.  Would it be possible to increase the dosage to 50 mg QHS? "

## 2011-01-30 NOTE — Telephone Encounter (Signed)
Yes, can switch to 50 mg amitriptyline 1 by mouth at bedtime Disp 30 and refill  enough until due for next appt.

## 2011-01-31 NOTE — H&P (Signed)
[place]  Patient:    Hector [name], Hector [name] Visit Number: [id number] MRN: [id number]          Service Type: MED Location: 2000 2024 01 Attending Physician:  Junious Silk Dictated by:   Madolyn Frieze. Jens Som, M.D. [facility] Admit Date:  01/19/2002 Discharge Date: 01/20/2002                           History and Physical  HISTORY OF PRESENT ILLNESS:  Hector [name] is a 50 year old gentleman with no prior cardiac history who presents for evaluation of chest pain.  The patient states that he had chest pain approximately two years ago and was seen by Dr. Valetta Mole. Swords.  At that time, he had an exercise treadmill that was negative by report.  He did well until approximately two weeks ago, when he had an episode of substernal chest pain that was described as a "heaviness." The pain did not radiate, nor was it positional or pleuritic.  It was not exertional or related to food.  It lasted for the majority of the day and then resolved.  Since that time, he has noticed increased dyspnea on exertion but there has been no orthopnea, PND, pedal edema, palpitations, presyncope or syncope.  Yesterday, he again noticed the chest pain.  It does occasionally radiate to the back and has been continuous since yesterday at 11:30 in the morning.  There is associated shortness of breath but there is no nausea, vomiting nor diaphoresis.  Again, the pain is not pleuritic or positional and not related to food.  There is no change with exertion.  He is having pain in the office today.  Because of these symptoms, we were asked to further evaluate.  ALLERGIES:  The patient has no known drug allergies.  FAMILY HISTORY:  His family history is positive for coronary artery disease in multiple family members including his father; his father had a myocardial infarction in his 56s.  PAST MEDICAL HISTORY:  There is no diabetes mellitus, hypertension or hyperlipidemia.  He does have a  history of bilateral hernia repairs.  There is no other significant past medical history.  SOCIAL HISTORY:  He does not smoke and only occasionally consume alcohol.  REVIEW OF SYSTEMS:  He denies any headaches or fever or chills.  There is no productive cough or hemoptysis.  There is no dysphagia, odynophagia, melena or hematochezia.  There is no dysuria or hematuria.  There is no rash or seizure activity.  There is no orthopnea, PND or pedal edema.  There is no claudication noted.  The remaining symptoms are negative other than he does complain of increased fatigue for two weeks.  PHYSICAL EXAMINATION:  VITAL SIGNS:  His physical exam shows a blood pressure of 124/98 and his pulse is 70.  He weighs 257 pounds.  GENERAL:  He is well-developed and somewhat obese.  He is in no acute distress.  SKIN:  Warm and dry.  HEENT:  Unremarkable with normal eyelids.  NECK:  Supple with a normal upstroke bilaterally and there are no bruits noted.  There is no jugular venous distention and no thyromegaly noted.  CHEST:  Clear to auscultation with normal expansion.  CARDIOVASCULAR:  Regular rate and rhythm with a normal S1 and S2.  There are no murmurs, rubs, or gallops noted.  ABDOMEN:  Not tender or distended.  Positive bowel sounds.  No hepatosplenomegaly and no masses appreciated.  There is no abdominal bruit. He does have a prior surgical scar due to his hernia repair.  He has 2+ femoral pulses bilaterally and no bruits.  EXTREMITIES:  His extremities show no edema and there are no cords palpated. He has 2+ dorsalis pedis pulses bilaterally.  NEUROLOGIC:  Exam is grossly intact.  LABORATORY AND ACCESSORY DATA:  His electrocardiogram shows normal sinus rhythm with no significant ST changes.  DIAGNOSES: 1. Atypical chest pain. 2. Dyspnea on exertion.  PLAN:  Hector [name] presents for evaluation of chest pain.  His symptoms are somewhat atypical but he is having chest pain while in  the office today.  His electrocardiogram also shows no ST changes.  We will plan to admit and rule out myocardial infarction with serial enzymes (essentially if his enzymes are negative at the time of presentation, he will have ruled out as his chest pain began at 11:30 in the morning on Jan 18, 2002).  We will also check a D-dimer and if positive, plan to proceed with a CT to rule out pulmonary embolus, although I think this is unlikely.  If his enzymes are negative, we will plan to risk-stratify with a stress Cardiolite.  Finally, I will add Protonix for the possibility of gastroesophageal reflux disease/indigestion.  We will make further recommendations once we have the above studies.  We will also treat with aspirin. Dictated by:   Madolyn Frieze. Jens Som, M.D. [facility] Attending Physician:  Junious Silk DD:  01/19/02 TD:  01/21/02 Job: [id number]

## 2011-01-31 NOTE — Op Note (Signed)
NAME:  Hector [name], Hector [name]                  ACCOUNT NO.:  [id number]   MEDICAL RECORD NO.:  [id number]          PATIENT TYPE:  AMB   LOCATION:  SDS                          FACILITY:  [facility]   PHYSICIAN:  Kristine Garbe. Ezzard Standing, M.D.DATE OF BIRTH:  April 11, 1961   DATE OF PROCEDURE:  07/31/2006  DATE OF DISCHARGE:                                 OPERATIVE REPORT   PREOPERATIVE DIAGNOSIS:  Obstructive sleep apnea, turbinate hypertrophy with  nasal obstruction, left side worse than right.   POSTOPERATIVE DIAGNOSIS:  Obstructive sleep apnea, turbinate hypertrophy  with nasal obstruction, left side worse than right.   OPERATION PERFORMED:  Bilateral inferior turbinate reductions,  uvulopalatopharyngoplasty with tonsillectomy.   SURGEON:  Kristine Garbe. Ezzard Standing, M.D.   ANESTHESIA:  General endotracheal anesthesia.   COMPLICATIONS:  None.   BRIEF CLINICAL NOTE:  Hector [name] is a 50 year old gentleman who has fairly  severe obstructive sleep apnea and has been on nasal CPAP for a number of  years.  He has had previous a septoplasty and turbinate reductions, but  continues to have some nasal obstruction.  On exam, he has large embedded 2-  3+ size tonsils with a long uvula.  The remainder of the exam is normal.  He  has had a history of tonsil infections and because of a history of sleep  apnea, large tonsils, and history of tonsil infections, he is taken to the  operating room at this time for UPPP with tonsillectomy and a turbinate  reduction to help improve his nasal airway.  I discussed extensively with  him that even with this surgery, he will probably have sleep apnea as he has  a severe sleep apnea and will probably continue to require nasal CPAP, but  this should help improve his sleep apnea to some degree.  In addition, it  should help improve his nasal airway as he has large turbinates and the  septum is relatively midline following previous septoplasty.   DESCRIPTION OF PROCEDURE:  After  adequate endotracheal anesthesia, the nose  was prepped first with Betadine solution and draped out with sterile towels.  The turbinates were injected with Xylocaine with epinephrine.  On the left  side, the turbinate was large and it was very engorged.  The lower 1/2 of  the turbinate was amputated with scissors.  Additional turbinate bone was  removed and the remaining turbinate was out fractured.  Suction cautery was  used for hemostasis on the left side.  On the right side, we had less  obstruction.  An incision was made along the inferior edge of the turbinate.  Mucosal flaps were elevated off the turbinate bone and the turbinate bone  was removed in addition to submucosal cauterization of the submucosa of the  turbinate.  The remaining posterior turbinate was out fractured.  This  completed the turbinate reductions.   The patient was then turned and a mouth gag was used to expose the  oropharynx.  The patient had large embedded tonsils.  Using cautery, the  tonsils were removed from both tonsillar fossae including the anterior  tonsillar pillar.  In addition, the uvula was basically transected at its  base and some additional posterior tonsillar pillar mucosa was removed.  The  distal 0.5 cm of soft palate was also amputated.  Hemostasis was obtained  with cautery.  The mucosal edges on each side of the palate were  reapproximated with 4-0 Vicryl and 3-0 chromic sutures.  This this completed  the procedure. Because of moderate bleeding from the nose, Telfa packing  soaked in Bacitracin ointment was placed in both nasal cavities.  I will  plan on removing this packing later this evening.  The patient was  subsequently awakened from anesthesia and transferred to recovery postop  doing well.   DISPOSITION:  The patient will be observed overnight because of a history of  severe sleep apnea.  We will plan on removing the nasal packs either today  or tomorrow morning and discharged home  in the morning on amoxicillin  suspension 500 mg b.i.d. for one week in addition to Lortab Elixir 15-30 mL  q.4h. p.r.n. pain.  Follow up in my office in two weeks for recheck after  discharge.           ______________________________  Kristine Garbe. Ezzard Standing, M.D.     CEN/MEDQ  D:  07/31/2006  T:  07/31/2006  Job:  [id number]

## 2011-01-31 NOTE — Op Note (Signed)
NAME:  Hector [name], Hector [name] NO.:  [id number]   MEDICAL RECORD NO.:  [id number]          PATIENT TYPE:  AMB   LOCATION:  DSC                          FACILITY:  [facility]   PHYSICIAN:  Lorre Munroe., M.D.DATE OF BIRTH:  [birth year]   DATE OF PROCEDURE:  06/21/2004  DATE OF DISCHARGE:                                 OPERATIVE REPORT   PREOPERATIVE DIAGNOSIS:  Vascular lesion of the left leg.   POSTOPERATIVE DIAGNOSIS:  Arteriovenous malformation of the left leg.   OPERATION:  Excision of arteriovenous malformation.   SURGEON:  Lebron Conners, M.D.   ANESTHESIA:  General.   CLINICAL NOTE:  The patient is a 50 year old man who has a recurrent  vascular lesion in the medial aspect of the left leg, which had twice  spontaneously bled in a rather alarming way.  He was advised to have it  excised and brought to the outpatient surgery center for that purpose.   PROCEDURE:  After the patient was monitored, anesthetized, and had routine  preparation and draping of the left lower extremity, I made an elliptical  incision around the visible vascular lesion in the proximal medial left leg.  I noted some ramifications of vascularity, particularly in the anterior  aspect, and kept my dissection close to the skin and removed all of the  tangle of vessels which was present.  I found several small veins and a  couple of small arteries feeding the lesion, and I ligated these with 3-0  silk and 3-0 Vicryl and divided them.  I completed the dissection with the  connections of the lesion to the long saphenous vein, but I preserved the  saphenous vein.  I then mobilized the subcutaneous tissues in both  directions and closed the subcutaneous tissues with running 2-0 Vicryl and  then closed the skin with staples and applied a slightly compressive  bandage.  He tolerated the operation well.       WB/MEDQ  D:  06/21/2004  T:  06/21/2004  Job:  [id number]   cc:   Neta Mends. Fabian Sharp, M.D.  [facility]

## 2011-01-31 NOTE — Discharge Summary (Signed)
[place] [facility]  Patient:    DREWEY, BEGUE Visit Number: [id number] MRN: [id number]          Service Type: MED Location: 2000 2024 01 Attending Physician:  Junious Silk Dictated by:   Lavella Hammock, P.A. Admit Date:  01/19/2002 Disc. Date: 01/20/02   CC:         Neta Mends. Fabian Sharp, M.D. [facility]  Griffith Citron, M.D.  Madolyn Frieze Jens Som, M.D. [facility]   Referring Physician Discharge [place]  DATE OF BIRTH:  04/14/1961  PROCEDURES:  Exercise Cardiolite.  HISTORY OF PRESENT ILLNESS:  Mr. Scherzer is a 50 year old male with no known history of coronary artery disease who was admitted to the hospital on Jan 19, 2002, for chest pain.  His cardiac risk factors include borderline hypertension without treatment and a family history including his father who died at age 68 of an MI.  HOSPITAL COURSE:  His enzymes were negative overnight and he had a D-dimer checked which was negative as well.  His chest x-ray was normal.  He had an exercise treadmill Cardiolite on Jan 20, 2002, once his enzymes were negative for MI.  The Cardiolite was negative for ischemia and his EF was 66%.  Because he had no further episodes of chest pain and his enzymes were negative and his Cardiolite was negative, as well as his D-dimer being normal, he was considered stable for discharge on Jan 20, 2002.  CHEST X-RAY:  Compared to the prior chest x-ray report, the chest is unchanged with heart and mediastinal contours within normal limits and no active disease.  LABORATORY VALUES:  Hemoglobin 15.6, hematocrit 44.0, WBC 6.2, platelets 132. Sodium 143, potassium 4.5, chloride 108, CO2 28, BUN 13, creatinine 1.3, glucose 121.  Serial CK-MB and troponin I negative for MI.  Total cholesterol 167, triglycerides 144, HDL 32, LDL 106.  TSH 2.476.  DISCHARGE CONDITION:  Stable.  DISCHARGE DIAGNOSES: 1. Chest pain, possibly secondary to gastrointestinal symptoms with no    myocardial  infarction by enzymes and Cardiolite negative for ischemia this    admission. 2. Borderline hypertension.  Follow up with primary care physician. 3. Hyperglycemia.  Follow up with primary care physician. 4. Mild dyslipidemia with a low-density lipoprotein of 102 and a high-density    lipoprotein of 32.  Diet and exercise therapy recommended at this time. 5. Family history of premature coronary artery disease.  ACTIVITY:  His activity level is to be as tolerated.  He is to start an exercise program at 5-10 minutes q.d. and increase as tolerated.  DIET:  He is to eat a low-fat diet.  FOLLOW-UP:  He is to call cardiology on a p.r.n. basis.  He is to follow up with Neta Mends. Fabian Sharp, M.D., and Griffith Citron, M.D.  DISCHARGE MEDICATIONS:  Prevacid 30 mg p.o. q.d. Dictated by:   Lavella Hammock, P.A. Attending Physician:  Junious Silk DD:  01/20/02 TD:  01/20/02 Job: [id number]

## 2011-01-31 NOTE — H&P (Signed)
Hector [name]. [facility]  Patient:    Hector [name], Hector [name] Visit Number: [id number] MRN: [id number]          Service Type: MED Location: 2000 2024 01 Attending Physician:  Junious Silk Dictated by:   Rozell Searing, P.A. Admit Date:  01/19/2002 Discharge Date: 01/20/2002   CC:         Neta Mends. Panosh, M.D. [facility]   History and Physical  HISTORY OF PRESENT ILLNESS:  Mr. Hector [name] is a 50 year old male with no prior history of heart disease, who was referred to our office today for evaluation of new onset chest tightness.  The patients cardiac risk factors are notable for a history of borderline hypertension (untreated) and family history of premature coronary artery disease.  He denies diabetes mellitus, dyslipidemia, or history of tobacco smoking.  Mr. Hector [name] presents with new onset of chest tightness over the past few weeks which has been intermittent in onset and not associated with any strenuous activity or exertion.  These symptoms have occurred at rest and can last for several hours in duration.  He describes the symptoms as someone "sitting on his chest" but denies any radiation to the neck, jaw, or upper extremities. He notes associated diaphoresis and dyspnea but no nausea.  The patients first episode occurred yesterday again while at work but not in the midst of any strenuous activity.  He rated a 7 on a scale of 1 to 10.  He was also noting chest tightness today.  The patient also reports slowly progressive exertional dyspnea over the past few months; however, he has sleep apnea and has been on CPAP for the past several years.  Electrocardiogram today reveals NSR, 75 BPM, with normal axis, and nonspecific ST abnormalities.  ALLERGIES:  No known drug allergies.  CURRENT MEDICATIONS:  Coated aspirin 325 mg q.d.  PAST MEDICAL HISTORY: 1. Fatty liver.    a. Mildly elevated liver enzymes. 2. Sleep apnea.    a. CPAP. 3. Obesity. 4. Remote history of  cluster headaches. 5. Status post double hernia repair. 6. Remote septoplasty. 7. Status post bilateral excess navicular bone excision.  REVIEW OF SYSTEMS:  Negative for history of myocardial infarction, congestive heart failure, syncope, or stroke.  No history of peptic ulcer disease or thyroid disease.  No recent hemoptysis, hematuria, or hematochezia. Otherwise, as per HPI.  The remainder of the review of systems is negative.  SOCIAL HISTORY:  Married.  Two children.  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  Also works as a Photographer.  He does not exercise on a regular basis.  He occasionally drinks alcohol but has never smoked tobacco on a regular basis.  FAMILY HISTORY:  Father age 71, status post MI at age 79.  Mother age 24, history of hypertension.  PHYSICAL EXAMINATION:  VITAL SIGNS:  Blood pressure 124/98, pulse 70 and regular.  Weight 257.  GENERAL:  A 50 year old male in no apparent distress.  HEENT:  Normocephalic and atraumatic.  NECK:  Intact with carotid pulses, without bruits.  LUNGS:  Clear to auscultation bilaterally.  HEART:  Regular rate and rhythm.  S1 and S2.  Positive S4.  No significant S4.  ABDOMEN:  Protuberant, nontender.  Intact bowel sounds.  EXTREMITIES:  Intact bilateral carotid pulses without bruits.  Significant intact distal pulses with no significant pedal edema.  NEUROLOGICAL:  Alert and oriented.  IMPRESSION: 1. New onset chest discomfort/exertional dyspnea. 2. History of  borderline hypertension. 3. Family history of premature coronary artery disease. 4. Sleep apnea.    a. CPAP. 5. Fatty liver.    a. Mild liver enzyme elevation.  PLAN:  Following review with Dr. Madolyn Frieze. Crenshaw, the recommendation is to proceed with direct admission for rule out myocardial infarction.  If cardiac enzymes are negative, the patient will be scheduled for an exercise  Cardiolite stress test in the morning.  We will also check a d-dimer, TSH, and fasting lipid profile. Dictated by:   Rozell Searing, P.A. Attending Physician:  Junious Silk DD:  01/19/02 TD:  01/19/02 Job: [id number]

## 2011-02-02 NOTE — Op Note (Signed)
NAME:  Hector [name], Hector [name] NO.:  [id number]  MEDICAL RECORD NO.:  [id number]           PATIENT TYPE:  O  LOCATION:  DAYL                         FACILITY:  [facility]  PHYSICIAN:  Jene Every, M.D.    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  PROCEDURES PERFORMED: 1. Lumbar decompression at L5-S1 on the left, foraminotomies at L5-S1. 2. Microdiskectomy at L5-S1, left  ANESTHESIA:  General.  ASSISTANT:  Roma Schanz, P.A.  BRIEF HISTORY:  This is a 50 year old with many years of left lower extremity radicular pain, L5-S1 nerve root distribution.  He had a transitional segment below that, facet arthropathy, he had been refractory to conservative treatment, had temporary relief from selective nerve root block.  He is indicated for decompression of the L5- S1 nerve roots and microdiskectomy and lateral recess decompression and foraminotomy.  Risk and benefits discussed including bleeding, infection, damage to neurovascular structures, no change in symptoms, worsening symptoms, need for repeat debridement, DVT, PE, anesthetic complications, etc.  TECHNIQUE:  The patient in supine position.  After induction of adequate general anesthesia, 2 g of Kefzol, he was placed prone on the [place] frame.  All bony prominences were well padded.  Lumbar region prepped and draped in usual sterile fashion.  An 18 gauge spinal needle was utilized to localize the L5-S1 interspace which we designated as the last opened space of his lumbar spine.  That was consistent with the nomenclature utilized in the MRI.  Initially, however, the last open space was labeled L4-L5 by the radiologist and we have asked him to reconcile  that numbering system.  Incision was made from spinous process __________ subcutaneous tissue was dissected.  Electrocautery was utilized to achieve hemostasis.  Dorsolumbar fascia identified by line of skin incision.  Paraspinous muscle elevated from lamina of L5-S1. Operating microscope draped and brought on to the surgical field. Hemilaminotomy at the caudad edge of L5 was performed with 2 mm Kerrison and cephalad edge of S1.  Ligamentum flavum removed from the interspace. Hypertrophic epidural lipomatosis was noted.  This was partially removed.  Gently mobilized the S1 nerve root medially after identification of foraminotomy of S1.  This was tethered by vascular leash as well as the disk herniation at L5-S1 on left extending into the foramen of L5.  Cauterized the vascular leash, divided them, mobilized the S1 nerve root medially.  Foraminotomy of L5 was performed as well as facet hypertrophy.  The disk was isolated.  We performed annulotomy. Copious portion of disk material was removed from the disk space with a straight and upbiting pituitary medially and from the opposite side of the operating room table into the foramen.  This was further mobilized with a hockey-stick and a Penfield.  After full diskectomy of herniated material, hockey-stick probe passed freely up the foramen of S1 and L5. No further disk herniation.  We  checked beneath thecal sac, axilla root, shoulder of the root.  With no residual compression.  Disk space copiously irrigated with antibiotic irrigation with no CSF leakage or active bleeding.  McCullough retractors were removed.  Paraspinous muscle inspected and with no active bleeding.  We draped the epidural fat over the S1 nerve root and thecal sac.  Again dorsolumbar fascia reapproximated with 0 Vicryl interrupted figure-of-eight sutures, subcu with 2-0 Vicryl simple sutures, skin was reapproximated with 4-0 subcuticular Prolene.  Wound reinforced with  Steri-Strips.  Sterile dressing applied.  Placed supine on hospital bed, extubated without difficulty and transported to the recovery room in satisfactory condition.  The patient tolerated the procedure well.  No complications.  Assistant was [employer].  Minimal blood loss.     Jene Every, M.D.     Cordelia Pen  D:  01/22/2011  T:  01/22/2011  Job:  [id number]  Electronically Signed by Jene Every M.D. on 02/02/2011 01:29:55 PM

## 2011-04-04 NOTE — Discharge Summary (Signed)
  NAME:  Hector [name], Hector [name] NO.:  [id number]  MEDICAL RECORD NO.:  [id number]  LOCATION:  1535                         FACILITY:  [facility]  PHYSICIAN:  Jene Every, M.D.    DATE OF BIRTH:  06-22-1961  DATE OF ADMISSION:  01/22/2011 DATE OF DISCHARGE:  01/23/2011                              DISCHARGE SUMMARY   ADMISSION DIAGNOSIS:  Spinal stenosis, herniated nucleus pulposus, L5-S1 on the left.  DISCHARGE DIAGNOSIS:  Status post lumbar decompression and microdiskectomy at L5-S1, left.  HOSPITAL COURSE:  Hospital course was uneventful.  Postop day #1, the patient was discharged home.  He is to follow with Dr. Shelle Iron in approximately 10 to 14 days for suture removal.  ACTIVITIES:  Walk as tolerated.  DIET:  As tolerated.  MEDICATIONS:  As per med rec sheet.  CONDITION ON DISCHARGE:  Stable.  FINAL DIAGNOSIS:  Doing well, status post lumbar decompression and microdiskectomy at L5-S1 on the left.  Roma Schanz, P.A.   ______________________________ Jene Every, M.D.    CS/MEDQ  D:  03/21/2011  T:  03/21/2011  Job:  [id number]  Electronically Signed by Roma Schanz P.A. on 03/31/2011 10:06:38 AM Electronically Signed by Jene Every M.D. on 04/04/2011 03:14:04 PM

## 2011-04-06 ENCOUNTER — Other Ambulatory Visit: Payer: Self-pay | Admitting: Internal Medicine

## 2011-06-07 ENCOUNTER — Emergency Department (HOSPITAL_COMMUNITY): Payer: Federal, State, Local not specified - PPO

## 2011-06-07 ENCOUNTER — Inpatient Hospital Stay (INDEPENDENT_AMBULATORY_CARE_PROVIDER_SITE_OTHER)
Admission: RE | Admit: 2011-06-07 | Discharge: 2011-06-07 | Disposition: A | Discharge status: Home / Self Care | Payer: Federal, State, Local not specified - PPO | Source: Ambulatory Visit | Attending: Family Medicine | Admitting: Family Medicine

## 2011-06-07 ENCOUNTER — Emergency Department (HOSPITAL_COMMUNITY)
Admission: EM | Admit: 2011-06-07 | Discharge: 2011-06-07 | Disposition: A | Discharge status: Home / Self Care | Payer: Federal, State, Local not specified - PPO | Attending: Emergency Medicine | Admitting: Emergency Medicine

## 2011-06-07 DIAGNOSIS — S0100XA Unspecified open wound of scalp, initial encounter: Secondary | ICD-10-CM | POA: Insufficient documentation

## 2011-06-07 DIAGNOSIS — Z79899 Other long term (current) drug therapy: Secondary | ICD-10-CM | POA: Insufficient documentation

## 2011-06-07 DIAGNOSIS — S0003XA Contusion of scalp, initial encounter: Secondary | ICD-10-CM

## 2011-06-07 DIAGNOSIS — F329 Major depressive disorder, single episode, unspecified: Secondary | ICD-10-CM | POA: Insufficient documentation

## 2011-06-07 DIAGNOSIS — IMO0002 Reserved for concepts with insufficient information to code with codable children: Secondary | ICD-10-CM | POA: Insufficient documentation

## 2011-06-07 DIAGNOSIS — F3289 Other specified depressive episodes: Secondary | ICD-10-CM | POA: Insufficient documentation

## 2011-06-09 ENCOUNTER — Other Ambulatory Visit (INDEPENDENT_AMBULATORY_CARE_PROVIDER_SITE_OTHER): Payer: Federal, State, Local not specified - PPO

## 2011-06-09 DIAGNOSIS — Z0000 Encounter for general adult medical examination without abnormal findings: Secondary | ICD-10-CM

## 2011-06-09 LAB — CBC WITH DIFFERENTIAL/PLATELET
Basophils Relative: 0.4 % (ref 0.0–3.0)
Eosinophils Absolute: 0.1 10*3/uL (ref 0.0–0.7)
Lymphocytes Relative: 29.8 % (ref 12.0–46.0)
MCHC: 33.2 g/dL (ref 30.0–36.0)
Neutrophils Relative %: 61.6 % (ref 43.0–77.0)
Platelets: 148 10*3/uL — ABNORMAL LOW (ref 150.0–400.0)
RBC: 4.72 Mil/uL (ref 4.22–5.81)
WBC: 6.2 10*3/uL (ref 4.5–10.5)

## 2011-06-09 LAB — LIPID PANEL
Cholesterol: 164 mg/dL (ref 0–200)
HDL: 41 mg/dL (ref >39.00)
LDL Cholesterol: 91 mg/dL (ref 0–99)
Total CHOL/HDL Ratio: 4
VLDL: 32 mg/dL (ref 0.0–40.0)

## 2011-06-09 LAB — POCT URINALYSIS DIPSTICK
Blood, UA: NEGATIVE
Glucose, UA: NEGATIVE
Nitrite, UA: NEGATIVE
Protein, UA: NEGATIVE
Urobilinogen, UA: 0.2
pH, UA: 5.5

## 2011-06-09 LAB — BASIC METABOLIC PANEL
Calcium: 9.1 mg/dL (ref 8.4–10.5)
Creatinine, Ser: 1.2 mg/dL (ref 0.4–1.5)

## 2011-06-09 LAB — HEPATIC FUNCTION PANEL
Alkaline Phosphatase: 99 U/L (ref 39–117)
Bilirubin, Direct: 0 mg/dL (ref 0.0–0.3)
Total Bilirubin: 0.5 mg/dL (ref 0.3–1.2)
Total Protein: 7 g/dL (ref 6.0–8.3)

## 2011-06-09 LAB — HEMOGLOBIN A1C: Hgb A1c MFr Bld: 6.1 % (ref 4.6–6.5)

## 2011-06-16 ENCOUNTER — Other Ambulatory Visit: Payer: Federal, State, Local not specified - PPO

## 2011-06-23 ENCOUNTER — Encounter: Payer: Self-pay | Admitting: Internal Medicine

## 2011-06-23 ENCOUNTER — Ambulatory Visit (INDEPENDENT_AMBULATORY_CARE_PROVIDER_SITE_OTHER): Payer: Federal, State, Local not specified - PPO | Admitting: Internal Medicine

## 2011-06-23 VITALS — BP 120/80 | HR 72 | Ht 70.0 in | Wt 214.0 lb

## 2011-06-23 DIAGNOSIS — Z9884 Bariatric surgery status: Secondary | ICD-10-CM

## 2011-06-23 DIAGNOSIS — F4323 Adjustment disorder with mixed anxiety and depressed mood: Secondary | ICD-10-CM

## 2011-06-23 DIAGNOSIS — E119 Type 2 diabetes mellitus without complications: Secondary | ICD-10-CM

## 2011-06-23 DIAGNOSIS — D696 Thrombocytopenia, unspecified: Secondary | ICD-10-CM

## 2011-06-23 DIAGNOSIS — E669 Obesity, unspecified: Secondary | ICD-10-CM

## 2011-06-23 DIAGNOSIS — J45909 Unspecified asthma, uncomplicated: Secondary | ICD-10-CM

## 2011-06-23 DIAGNOSIS — G4733 Obstructive sleep apnea (adult) (pediatric): Secondary | ICD-10-CM

## 2011-06-23 DIAGNOSIS — E785 Hyperlipidemia, unspecified: Secondary | ICD-10-CM

## 2011-06-23 DIAGNOSIS — Z0000 Encounter for general adult medical examination without abnormal findings: Secondary | ICD-10-CM

## 2011-06-23 MED ORDER — ESCITALOPRAM OXALATE 10 MG PO TABS: ORAL_TABLET | ORAL | Status: DC

## 2011-06-23 MED ORDER — AMITRIPTYLINE HCL 50 MG PO TABS: 50.0000 mg | ORAL_TABLET | Freq: Every evening | ORAL | Status: DC | PRN
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
## 2011-06-23 NOTE — Assessment & Plan Note (Signed)
Stable  Would be best for specialist to manage related meds

## 2011-06-23 NOTE — Assessment & Plan Note (Signed)
Still on cpap hasnt be retested since weight loss

## 2011-06-23 NOTE — Assessment & Plan Note (Signed)
Better but fbs  Slightly elevated recently. a1c in prediabetic range

## 2011-06-23 NOTE — Assessment & Plan Note (Signed)
No change no bleed

## 2011-06-23 NOTE — Patient Instructions (Signed)
Continue lifestyle intervention healthy eating and exercise . Check BG ocassionally and call if elevated . Check labs.  A1c in about 6 months. It would be optimum if specialist prescribed all  Of the psychoactive medications for better management. Will refill 90 days at this time.

## 2011-07-21 ENCOUNTER — Emergency Department (HOSPITAL_COMMUNITY): Payer: Federal, State, Local not specified - PPO

## 2011-07-21 ENCOUNTER — Encounter (HOSPITAL_COMMUNITY): Payer: Self-pay

## 2011-07-21 ENCOUNTER — Emergency Department (HOSPITAL_COMMUNITY)
Admission: EM | Admit: 2011-07-21 | Discharge: 2011-07-21 | Disposition: A | Discharge status: Home / Self Care | Payer: Federal, State, Local not specified - PPO | Attending: Emergency Medicine | Admitting: Emergency Medicine

## 2011-07-21 DIAGNOSIS — R10812 Left upper quadrant abdominal tenderness: Secondary | ICD-10-CM | POA: Insufficient documentation

## 2011-07-21 DIAGNOSIS — Z9884 Bariatric surgery status: Secondary | ICD-10-CM | POA: Insufficient documentation

## 2011-07-21 DIAGNOSIS — N201 Calculus of ureter: Secondary | ICD-10-CM | POA: Insufficient documentation

## 2011-07-21 DIAGNOSIS — R1032 Left lower quadrant pain: Secondary | ICD-10-CM | POA: Insufficient documentation

## 2011-07-21 DIAGNOSIS — N200 Calculus of kidney: Secondary | ICD-10-CM

## 2011-07-21 DIAGNOSIS — R61 Generalized hyperhidrosis: Secondary | ICD-10-CM | POA: Insufficient documentation

## 2011-07-21 LAB — BASIC METABOLIC PANEL
CO2: 21 mEq/L (ref 19–32)
Chloride: 101 mEq/L (ref 96–112)
Creatinine, Ser: 1.53 mg/dL — ABNORMAL HIGH (ref 0.50–1.35)
Potassium: 3.4 mEq/L — ABNORMAL LOW (ref 3.5–5.1)

## 2011-07-21 LAB — URINALYSIS, ROUTINE W REFLEX MICROSCOPIC
Glucose, UA: NEGATIVE mg/dL
Leukocytes, UA: NEGATIVE
Specific Gravity, Urine: 1.025 (ref 1.005–1.030)
pH: 7 (ref 5.0–8.0)

## 2011-07-21 LAB — POCT I-STAT, CHEM 8
Calcium, Ion: 1.09 mmol/L — ABNORMAL LOW (ref 1.12–1.32)
Creatinine, Ser: 1.7 mg/dL — ABNORMAL HIGH (ref 0.50–1.35)
Glucose, Bld: 133 mg/dL — ABNORMAL HIGH (ref 70–99)
Hemoglobin: 17.7 g/dL — ABNORMAL HIGH (ref 13.0–17.0)
Sodium: 142 mEq/L (ref 135–145)
TCO2: 20 mmol/L (ref 0–100)

## 2011-07-21 LAB — DIFFERENTIAL
Basophils Absolute: 0 10*3/uL (ref 0.0–0.1)
Lymphocytes Relative: 21 % (ref 12–46)
Monocytes Absolute: 0.3 10*3/uL (ref 0.1–1.0)
Monocytes Relative: 4 % (ref 3–12)
Neutro Abs: 5.3 10*3/uL (ref 1.7–7.7)
Neutrophils Relative %: 73 % (ref 43–77)

## 2011-07-21 LAB — CBC
HCT: 48.6 % (ref 39.0–52.0)
Hemoglobin: 16.4 g/dL (ref 13.0–17.0)
RDW: 13.1 % (ref 11.5–15.5)
WBC: 7.2 10*3/uL (ref 4.0–10.5)

## 2011-07-21 MED ORDER — HYDROMORPHONE HCL PF 1 MG/ML IJ SOLN: 1.0000 mg | Freq: Once | INTRAMUSCULAR | Status: DC

## 2011-07-21 MED ORDER — HYDROMORPHONE HCL PF 1 MG/ML IJ SOLN
1.0000 mg | Freq: Once | INTRAMUSCULAR | Status: AC
  Administered 2011-07-21: 1 mg via INTRAVENOUS
  Filled 2011-07-21: qty 1

## 2011-07-21 MED ORDER — OXYCODONE-ACETAMINOPHEN 5-325 MG PO TABS: 2.0000 | ORAL_TABLET | ORAL | Status: AC | PRN

## 2011-07-21 MED ORDER — ONDANSETRON HCL 4 MG/2ML IJ SOLN
INTRAMUSCULAR | Status: AC
  Filled 2011-07-21: qty 2

## 2011-07-21 MED ORDER — ONDANSETRON 4 MG PO TBDP: 4.0000 mg | ORAL_TABLET | Freq: Three times a day (TID) | ORAL | Status: AC | PRN

## 2011-07-21 MED ORDER — ONDANSETRON HCL 4 MG/2ML IJ SOLN
4.0000 mg | Freq: Once | INTRAMUSCULAR | Status: AC
  Administered 2011-07-21 (×2): 4 mg via INTRAVENOUS

## 2011-07-21 MED ORDER — HYDROMORPHONE HCL PF 1 MG/ML IJ SOLN
INTRAMUSCULAR | Status: AC
  Administered 2011-07-21: 1 mg via INTRAVENOUS
  Filled 2011-07-21: qty 1

## 2011-07-21 MED ORDER — ONDANSETRON HCL 4 MG/2ML IJ SOLN
INTRAMUSCULAR | Status: AC
  Administered 2011-07-21: 4 mg via INTRAVENOUS
  Filled 2011-07-21: qty 2

## 2011-07-21 MED ORDER — IOHEXOL 300 MG/ML  SOLN
100.0000 mL | Freq: Once | INTRAMUSCULAR | Status: AC | PRN
  Administered 2011-07-21: 100 mL via INTRAVENOUS

## 2011-07-21 MED ORDER — TAMSULOSIN HCL 0.4 MG PO CAPS: 0.4000 mg | ORAL_CAPSULE | Freq: Every day | ORAL | Status: DC

## 2011-07-21 MED ORDER — HYDROMORPHONE HCL PF 1 MG/ML IJ SOLN
INTRAMUSCULAR | Status: AC
  Filled 2011-07-21: qty 1

## 2011-07-21 MED ORDER — HYDROMORPHONE HCL PF 1 MG/ML IJ SOLN
1.0000 mg | Freq: Once | INTRAMUSCULAR | Status: AC
  Administered 2011-07-21 (×2): 1 mg via INTRAVENOUS

## 2011-07-21 NOTE — ED Notes (Signed)
Pt had gastric bypass surgery in 2011 and in Sept of that year he had a perforated ulcer and had surgery for that, pt states that he started having severe abd pain last night that is similar to the event last year

## 2011-07-21 NOTE — ED Notes (Signed)
Discharge instruction given to patient. Prescription given to patient. Pt denied any questions or needs.

## 2011-07-21 NOTE — ED Notes (Signed)
Sorry in the mist of all this pt stated his pain had woke him up last night at 2300 and continued to get worse

## 2011-07-21 NOTE — ED Notes (Signed)
Dr. Estell Harpin verbally ordered 1 mg of hydromorphone. Pt continues to c/o pain. Pt medicated with hydromorphone 1 mg. Pt resting in bed, nad noted. No changed in status noted. Will continue to monitor

## 2011-07-21 NOTE — ED Provider Notes (Signed)
History     CSN: [id number] Arrival date & time: 07/21/2011  6:03 AM   First MD Initiated Contact with Patient 07/21/11 [phone]      No chief complaint on file.   (Consider location/radiation/quality/duration/timing/severity/associated sxs/prior treatment) HPI Comments: Patient is a 50 year old male with a history of gastric bypass surgery who presents with acute onset of left lower quadrant pain in the last 5 hours. This pain is constant, severe, not associated with nausea vomiting or diarrhea. He is passing gas and does not feel like his abdomen is distended. He states it is sharp, severe and similar to pain that he had when he had a perforated stomach ulcer in the past.  The history is provided by the patient.    Past Medical History  Diagnosis Date  . Diabetes mellitus   . Hyperlipidemia   . Abnormal LFTs     improved after gastic bypass  . History of concussion   . Gout   . Sleep apnea   . Perforated ulcer     Surgical repair9/24 2011  . B12 nutritional deficiency   . Closed head injury     laceration  9 12  no loc  vision change    Past Surgical History  Procedure Date  . Vasectomy   . Hernia repair     umbilical   . Nasal septoplasty w/ turbinoplasty     x 2  . Gastric bypass 11/06/09    beginning weight 267  . Epidural block injection     by Dr. Ethelene Hal  . Surgerical repair of stomach ulcer 06/08/10    per  . Back surgery     Dr. Shelle Iron    Family History  Problem Relation Age of Onset  . Uterine cancer Mother   . Hypertension Mother   . Cervical cancer Mother   . Heart disease Father   . COPD Father     History  Substance Use Topics  . Smoking status: Never Smoker   . Smokeless tobacco: Not on file  . Alcohol Use: Yes     socially      Review of Systems  All other systems reviewed and are negative.    Allergies  Aspirin  Home Medications   Current Outpatient Rx  Name Route Sig Dispense Refill  . ALBUTEROL 90 MCG/ACT IN AERS Inhalation  Inhale 2 puffs into the lungs every 6 (six) hours as needed.     [name] AMITRIPTYLINE HCL 50 MG PO TABS Oral Take 1 tablet (50 mg total) by mouth at bedtime as needed for sleep. 90 tablet 1  . CALCIUM CITRATE 950 MG PO TABS Oral Take 1 tablet by mouth daily.      . CYANOCOBALAMIN 2500 MCG SL SUBL Sublingual Place under the tongue.      [name] ESCITALOPRAM OXALATE 10 MG PO TABS  3 tabs daily 270 tablet 1  . HYDROCODONE-ACETAMINOPHEN 5-500 MG PO TABS Oral Take 1 tablet by mouth every 6 (six) hours as needed.     . INDOMETHACIN 50 MG PO CAPS Oral Take 1 capsule (50 mg total) by mouth 3 (three) times daily with meals. 40 capsule 2  . LAMOTRIGINE 100 MG PO TABS Oral Take 100 mg by mouth daily. Dr. Jones Bales    . METHOCARBAMOL 750 MG PO TABS Oral Take 750 mg by mouth 4 (four) times daily.      . ADULT ONE DAILY GUMMIES PO CHEW Oral Chew by mouth.        BP  145/88  Pulse 79  Resp 24  SpO2 100%  Physical Exam  Nursing note and vitals reviewed. Constitutional: He appears well-developed and well-nourished. He appears distressed.  HENT:  Head: Normocephalic and atraumatic.  Mouth/Throat: Oropharynx is clear and moist. No oropharyngeal exudate.  Eyes: Conjunctivae and EOM are normal. Pupils are equal, round, and reactive to light. Right eye exhibits no discharge. Left eye exhibits no discharge. No scleral icterus.  Neck: Normal range of motion. Neck supple. No JVD present. No thyromegaly present.  Cardiovascular: Normal rate, regular rhythm, normal heart sounds and intact distal pulses.  Exam reveals no gallop and no friction rub.   No murmur heard. Pulmonary/Chest: Effort normal and breath sounds normal. No respiratory distress. He has no wheezes. He has no rales.  Abdominal: Soft. Bowel sounds are normal. He exhibits no distension and no mass. There is tenderness ( Severe tenderness to the left upper quadrant the abdomen is soft and nondistended and no other areas of abdominal tenderness there is guarding in  the left lower quadrant).  Musculoskeletal: Normal range of motion. He exhibits no edema and no tenderness.  Lymphadenopathy:    He has no cervical adenopathy.  Neurological: He is alert. Coordination normal.  Skin: Skin is warm. No rash noted. No erythema.       Diaphoretic  Psychiatric: He has a normal mood and affect. His behavior is normal.    ED Course  Procedures (including critical care time)   Labs Reviewed  CBC  DIFFERENTIAL  BASIC METABOLIC PANEL  I-STAT, CHEM 8   No results found.   No diagnosis found.    MDM  Significant abdominal pain with guarding in the left area. Suspect a bowel abnormality including postoperative adhesion-related bowel obstruction, perforation, perforated diverticulitis. Plain film to rule out free air, lab work, IV dilaudid for pain control    No obvious free air seen on the plain films, improved with dye lot at, will repeat and consultation will surgery at the patient's request. ED scan pending   Discussed with Dr. Daphine Deutscher who is aware of pt and will have pt seen by surgical team in the ED.  CT pending.    Change of shift - care signed out to Dr. Cory Roughen, MD 07/21/11 [phone]

## 2011-07-21 NOTE — ED Notes (Signed)
Upon entering room to examine pt I introduced myself,  As I am interviewing pt,  His wife is writing down my name on a tablet she keeps in her purse and everyone else that comes into room,  When pt is asked the same questions by this Clinical research associate and Darl Pikes AD, he begins to escalate his voice,  When Darl Pikes walks out to get Dr Hyacinth Meeker,  Pt proceeds to tell me don't cop an attitude with him because I told him we didn't have our ears plugged, this is reply to him looking at his wife and stating what the hell is wrong with these damn people do they have their ears plugged,  He then proceeded to tell me that he would have me hung on a rope and my job "ladie" as he is glaring at me and his wife is also yelling at me at the same time.  Pt continues to ramble his threatening words, I walk out of room and go to Dr's room and tell him and Pete Glatter what happened,  Dr Hyacinth Meeker goes to bedside with Darl Pikes AD and Fleet Contras RN and then you hear yelling outside of door,  Few minutes later Dr Hyacinth Meeker has me to come to bedside and has pt to apologize to me for threatening me and my job, I accept apology but pt refuses for me to care for him, care turned over to [employer]

## 2011-07-21 NOTE — ED Notes (Signed)
Report received from night RN. First contact with patient. Pt alert, oriented, this RN introduced to the patient and family member, pt is calm and cooperative, asking for pain medication. nad noted, will continue to monitor.

## 2011-07-21 NOTE — ED Notes (Signed)
Pt continues to complains of pain. EDP notified, and pt informed

## 2011-10-16 ENCOUNTER — Emergency Department (HOSPITAL_COMMUNITY): Payer: Federal, State, Local not specified - PPO

## 2011-10-16 ENCOUNTER — Emergency Department (HOSPITAL_COMMUNITY)
Admission: EM | Admit: 2011-10-16 | Discharge: 2011-10-16 | Disposition: A | Discharge status: Home / Self Care | Payer: Federal, State, Local not specified - PPO | Attending: Emergency Medicine | Admitting: Emergency Medicine

## 2011-10-16 ENCOUNTER — Telehealth: Payer: Self-pay | Admitting: *Deleted

## 2011-10-16 ENCOUNTER — Encounter (HOSPITAL_COMMUNITY): Payer: Self-pay

## 2011-10-16 ENCOUNTER — Other Ambulatory Visit: Payer: Self-pay

## 2011-10-16 DIAGNOSIS — R05 Cough: Secondary | ICD-10-CM | POA: Insufficient documentation

## 2011-10-16 DIAGNOSIS — Z79899 Other long term (current) drug therapy: Secondary | ICD-10-CM | POA: Insufficient documentation

## 2011-10-16 DIAGNOSIS — Z862 Personal history of diseases of the blood and blood-forming organs and certain disorders involving the immune mechanism: Secondary | ICD-10-CM | POA: Insufficient documentation

## 2011-10-16 DIAGNOSIS — R51 Headache: Secondary | ICD-10-CM | POA: Insufficient documentation

## 2011-10-16 DIAGNOSIS — R059 Cough, unspecified: Secondary | ICD-10-CM | POA: Insufficient documentation

## 2011-10-16 DIAGNOSIS — Z8639 Personal history of other endocrine, nutritional and metabolic disease: Secondary | ICD-10-CM | POA: Insufficient documentation

## 2011-10-16 DIAGNOSIS — E119 Type 2 diabetes mellitus without complications: Secondary | ICD-10-CM | POA: Insufficient documentation

## 2011-10-16 DIAGNOSIS — R5381 Other malaise: Secondary | ICD-10-CM | POA: Insufficient documentation

## 2011-10-16 DIAGNOSIS — R0789 Other chest pain: Secondary | ICD-10-CM | POA: Insufficient documentation

## 2011-10-16 DIAGNOSIS — R0602 Shortness of breath: Secondary | ICD-10-CM | POA: Insufficient documentation

## 2011-10-16 LAB — POCT I-STAT, CHEM 8
Calcium, Ion: 1.23 mmol/L (ref 1.12–1.32)
Creatinine, Ser: 1.1 mg/dL (ref 0.50–1.35)
Glucose, Bld: 106 mg/dL — ABNORMAL HIGH (ref 70–99)
HCT: 47 % (ref 39.0–52.0)
Hemoglobin: 16 g/dL (ref 13.0–17.0)
Potassium: 4.4 mEq/L (ref 3.5–5.1)
TCO2: 28 mmol/L (ref 0–100)

## 2011-10-16 LAB — URINALYSIS, ROUTINE W REFLEX MICROSCOPIC
Hgb urine dipstick: NEGATIVE
Protein, ur: NEGATIVE mg/dL
Urobilinogen, UA: 1 mg/dL (ref 0.0–1.0)

## 2011-10-16 LAB — CBC
MCH: 31.8 pg (ref 26.0–34.0)
MCHC: 33.9 g/dL (ref 30.0–36.0)
Platelets: 160 10*3/uL (ref 150–400)
RDW: 12.9 % (ref 11.5–15.5)

## 2011-10-16 LAB — DIFFERENTIAL
Basophils Absolute: 0 10*3/uL (ref 0.0–0.1)
Basophils Relative: 0 % (ref 0–1)
Eosinophils Absolute: 0.1 10*3/uL (ref 0.0–0.7)
Monocytes Relative: 6 % (ref 3–12)
Neutrophils Relative %: 58 % (ref 43–77)

## 2011-10-16 MED ORDER — OMEPRAZOLE 20 MG PO CPDR: 20.0000 mg | DELAYED_RELEASE_CAPSULE | Freq: Every day | ORAL | Status: DC

## 2011-10-16 MED ORDER — NITROGLYCERIN 0.4 MG SL SUBL
0.4000 mg | SUBLINGUAL_TABLET | SUBLINGUAL | Status: DC | PRN
  Administered 2011-10-16 (×2): 0.4 mg via SUBLINGUAL

## 2011-10-16 NOTE — ED Notes (Signed)
PA at bedside.

## 2011-10-16 NOTE — ED Provider Notes (Signed)
Pt presents with chest pain that has been constant for at least the last day.  Atypical for cardiac etiology and in the setting of normal labs with 24 hour constant pain do not feel this is cardiac in origin.  No leg swelling or risk factors for PE.  Will have pt follow up with PCP.  Will rx antacids.  Close follow up planned.  Medical screening examination/treatment/procedure(s) were conducted as a shared visit with non-physician practitioner(s) and myself.  I personally evaluated the patient during the encounter   Celene Kras, MD 10/16/11 1130

## 2011-10-16 NOTE — Telephone Encounter (Signed)
Pt's wife calls stating husband is having chest tightness and SOB with fatigue x 3 days.  No cough or fever. Advised ER STAT.  Wife agrees.

## 2011-10-16 NOTE — ED Notes (Signed)
Patient states that a few days ago he developed some left sided pressure underneath his heart. He states that over the past 2-3 days the pain has increased and in the last 24 hours continued to get worse. He states that he is having a "strange sensation in his lips" and he feels short of breath at times. No meds pta.

## 2011-10-16 NOTE — ED Notes (Signed)
Pt brought back from triage and put on cardiac monitor.

## 2011-10-16 NOTE — Telephone Encounter (Signed)
i agree  With above

## 2011-10-16 NOTE — ED Provider Notes (Signed)
History     CSN: [id number]  Arrival date & time 10/16/11  4098   First MD Initiated Contact with Patient 10/16/11 [phone]      Chief Complaint  Patient presents with  . Chest Pain  . Fatigue    (Consider location/radiation/quality/duration/timing/severity/associated sxs/prior treatment) HPI Comments: Patient with history of gastric bypass surgery -- presents with substernal chest aching and chest tightness for 3 days. Pain has been there constantly however waxes and wanes. The pain does not radiate. It has been associated with shortness of breath at times. It has not been associated with radiation, palpitations, diaphoresis. Patient does report a "strange sensation" in bilateral fingertips and lips. Patient has a past history of diabetes that resolved with bypass surgery. Patient's father had chest pain symptoms at age 49 but patient is unsure if this was heart related or not. No reported history of hypertension, hypercholesterolemia. Patient had flulike symptoms 3-4 weeks ago that improved however patient continues to complain about nasal congestion and discharge. Pain is not improved with leaning forward or lying flat. The treatments prior to arrival. Patient had a stress test approximately 11 years ago. No history of cardiac catheterization.  Patient is a 51 y.o. male presenting with chest pain. The history is provided by the patient.  Chest Pain The chest pain began 3 - 5 days ago. Chest pain occurs constantly. Progression since onset: waxing and waning. The quality of the pain is described as aching and tightness. The pain does not radiate. Primary symptoms include shortness of breath and cough. Pertinent negatives for primary symptoms include no fever, no syncope, no wheezing, no palpitations, no abdominal pain, no nausea and no vomiting.  Pertinent negatives for associated symptoms include no diaphoresis. He tried nothing for the symptoms. Risk factors include male gender.  Procedure  history is positive for stress echo.  Procedure history is negative for cardiac catheterization.     Past Medical History  Diagnosis Date  . Diabetes mellitus   . Hyperlipidemia   . Abnormal LFTs     improved after gastic bypass  . History of concussion   . Gout   . Sleep apnea   . Perforated ulcer     Surgical repair9/24 2011  . B12 nutritional deficiency   . Closed head injury     laceration  9 12  no loc  vision change    Past Surgical History  Procedure Date  . Vasectomy   . Hernia repair     umbilical   . Nasal septoplasty w/ turbinoplasty     x 2  . Gastric bypass 11/06/09    beginning weight 267  . Epidural block injection     by Dr. Ethelene Hal  . Surgerical repair of stomach ulcer 06/08/10    per  . Back surgery     Dr. Shelle Iron    Family History  Problem Relation Age of Onset  . Uterine cancer Mother   . Hypertension Mother   . Cervical cancer Mother   . Heart disease Father   . COPD Father     History  Substance Use Topics  . Smoking status: Never Smoker   . Smokeless tobacco: Not on file  . Alcohol Use: No     socially      Review of Systems  Constitutional: Negative for fever and diaphoresis.  HENT: Negative for congestion, rhinorrhea and neck pain.   Eyes: Negative for redness.  Respiratory: Positive for cough, chest tightness and shortness of breath. Negative for  wheezing.   Cardiovascular: Positive for chest pain. Negative for palpitations, leg swelling and syncope.  Gastrointestinal: Negative for nausea, vomiting and abdominal pain.  Genitourinary: Negative for dysuria.  Musculoskeletal: Negative for back pain.  Skin: Negative for rash.  Neurological: Negative for syncope and light-headedness.    Allergies  Aspirin  Home Medications   Current Outpatient Rx  Name Route Sig Dispense Refill  . ALBUTEROL 90 MCG/ACT IN AERS Inhalation Inhale 2 puffs into the lungs every 6 (six) hours as needed. For shortness of breath.    . AMITRIPTYLINE  HCL 50 MG PO TABS Oral Take 50 mg by mouth at bedtime.      [name] CALCIUM CITRATE 950 MG PO TABS Oral Take 1 tablet by mouth daily.     . CYANOCOBALAMIN 2500 MCG SL SUBL Sublingual Place 1 tablet under the tongue every morning.     [name] ENDOCET 5-325 MG PO TABS Oral Take 1 tablet by mouth At bedtime as needed.    [name] ESCITALOPRAM OXALATE 10 MG PO TABS Oral Take 30 mg by mouth every morning. 3 tabs daily     . HYDROCODONE-ACETAMINOPHEN 5-500 MG PO TABS Oral Take 1 tablet by mouth every 6 (six) hours as needed. Back pain.    . INDOMETHACIN 50 MG PO CAPS Oral Take 50 mg by mouth every 8 (eight) hours as needed. For gout.     [name] LAMOTRIGINE 150 MG PO TABS Oral Take 150 mg by mouth every morning.      [name] METHOCARBAMOL 750 MG PO TABS Oral Take 750 mg by mouth 4 (four) times daily as needed. Back pain.    . ADULT ONE DAILY GUMMIES PO CHEW Oral Chew 1 tablet by mouth daily.     [name] TAMSULOSIN HCL 0.4 MG PO CAPS Oral Take 1 capsule (0.4 mg total) by mouth daily. 5 capsule 0    BP 127/86  Pulse 65  Temp(Src) 98.4 F (36.9 C) (Oral)  Resp 20  Ht 5\' 10"  (1.778 m)  Wt 201 lb (91.173 kg)  BMI 28.84 kg/m2  SpO2 98%  Physical Exam  Nursing note and vitals reviewed. Constitutional: He is oriented to person, place, and time. He appears well-developed and well-nourished.  HENT:  Head: Normocephalic and atraumatic.  Mouth/Throat: Mucous membranes are normal. Mucous membranes are not dry.  Eyes: Conjunctivae are normal.  Neck: Trachea normal and normal range of motion. Neck supple. No JVD present. No muscular tenderness present. No tracheal deviation present.  Cardiovascular: Normal rate, regular rhythm, S1 normal, S2 normal, normal heart sounds and intact distal pulses.  Exam reveals no distant heart sounds and no decreased pulses.   No murmur heard. Pulmonary/Chest: Effort normal and breath sounds normal. No respiratory distress. He has no wheezes. He exhibits no tenderness.  Abdominal: Soft. Normal aorta and  bowel sounds are normal. There is no tenderness. There is no rebound and no guarding.  Musculoskeletal: He exhibits no edema.  Neurological: He is alert and oriented to person, place, and time.  Skin: Skin is warm and dry. He is not diaphoretic. No cyanosis. No pallor.  Psychiatric: He has a normal mood and affect.    ED Course  Procedures (including critical care time)  Labs Reviewed  URINALYSIS, ROUTINE W REFLEX MICROSCOPIC - Abnormal; Notable for the following:    Ketones, ur 15 (*)    All other components within normal limits  POCT I-STAT, CHEM 8 - Abnormal; Notable for the following:    Glucose, Bld 106 (*)  All other components within normal limits  CBC  DIFFERENTIAL  POCT I-STAT TROPONIN I   Dg Chest 2 View  10/16/2011  *RADIOLOGY REPORT*  Clinical Data: Chest pain  CHEST - 2 VIEW  Comparison: 07/21/2011  Findings: Heart size is normal.  No pleural effusion or edema.  No airspace consolidation identified.  Atelectasis noted within the left base.  Review of the visualized osseous structures is significant for mild spondylosis.  IMPRESSION:  1.  Left base atelectasis.  Original Report Authenticated By: Rosealee Albee, M.D.     1. Chest pain     9:58 AM Patient seen and examined. EKG reviewed. Aspirin not given due to history of ruptured ulcer. NTG ordered. Work-up ordered. Pt is stable. Well-appearing.    Date: 10/16/2011  Rate: 64  Rhythm: normal sinus rhythm  QRS Axis: normal  Intervals: normal  ST/T Wave abnormalities: normal  Conduction Disutrbances:none  Narrative Interpretation: compared with 06/08/2010  Old EKG Reviewed: resolution of sinus tachycardia  12:14 PM Patient was d/w and seen by Dr. Linwood Dibbles. Patient and wife informed of results. Patient is feeling better. Patient given empiric trial of PPI. Urged PCP follow-up for eval of risk factors and outpatient work-up. Patient agrees with this plan.  Will discharge to home.  Patient told to follow-up with  PCP in next week.  Patient told to return to ED with persistent CP associated with exertion, sweating, racing heart, palpitations, shortness of breath, lightheadedness, radiation of pain into jaw, neck, or arms.  Patient verbalizes understanding and agrees with plan.     MDM  Patient with chest tightness. Doubt cardiac origin of pain given: CONSTANT pain for 3 days, neg cardiac enzymes, normal and unchanged EKG, no exertional component, lack of accompanying sx including palps, sweating, SOB, radiation. Do not suspect pericarditis but cannot completely exclude, no effusion on CXR or EKG findings. Do not suspect vascular etiology. Will try PPI trial. Patient has good PCP follow-up and seems reasonable. Do not suspect complication with gastric bypass given symptoms, no vomiting.         Carolee Rota, [place] 10/16/11 1220

## 2011-12-22 ENCOUNTER — Other Ambulatory Visit (INDEPENDENT_AMBULATORY_CARE_PROVIDER_SITE_OTHER): Payer: Federal, State, Local not specified - PPO

## 2011-12-22 DIAGNOSIS — IMO0001 Reserved for inherently not codable concepts without codable children: Secondary | ICD-10-CM

## 2011-12-25 LAB — HEMOGLOBIN A1C: Hgb A1c MFr Bld: 6.2 % (ref 4.6–6.5)

## 2011-12-29 ENCOUNTER — Ambulatory Visit (INDEPENDENT_AMBULATORY_CARE_PROVIDER_SITE_OTHER): Payer: Federal, State, Local not specified - PPO | Admitting: Internal Medicine

## 2011-12-29 ENCOUNTER — Encounter: Payer: Self-pay | Admitting: Internal Medicine

## 2011-12-29 VITALS — BP 118/72 | HR 85 | Temp 98.0°F | Wt 200.0 lb

## 2011-12-29 DIAGNOSIS — IMO0002 Reserved for concepts with insufficient information to code with codable children: Secondary | ICD-10-CM

## 2011-12-29 DIAGNOSIS — F4323 Adjustment disorder with mixed anxiety and depressed mood: Secondary | ICD-10-CM

## 2011-12-29 DIAGNOSIS — G4730 Sleep apnea, unspecified: Secondary | ICD-10-CM

## 2011-12-29 DIAGNOSIS — E785 Hyperlipidemia, unspecified: Secondary | ICD-10-CM

## 2011-12-29 DIAGNOSIS — M109 Gout, unspecified: Secondary | ICD-10-CM

## 2011-12-29 DIAGNOSIS — Z9884 Bariatric surgery status: Secondary | ICD-10-CM

## 2011-12-29 DIAGNOSIS — E119 Type 2 diabetes mellitus without complications: Secondary | ICD-10-CM

## 2011-12-29 DIAGNOSIS — E669 Obesity, unspecified: Secondary | ICD-10-CM

## 2011-12-29 NOTE — Progress Notes (Signed)
  Subjective:    Patient ID: Hector [name], male    DOB: 1961/09/13, 51 y.o.   MRN: [id number]  HPI Patient comes in today for follow up of  multiple medical problems.  Since last visit he is doing well   had flu like illness in winter and has had nasal congestion for a while without fever or pain .  Doesn't use nose sprays with hx of nasal surgery and  Nose bleeding no cough or fever .   Increase in Loda .  Per psych to 10 mg.   Back still bad  And neck  probelmatic .   Sees Dr [name] takes one pain pill per day and robaxin if needed  DM doing well of meds rarely gets low sugar sx bg in 90 s  ? Cause  Exercising and no infections or numbness and utd on eye checks   No gout recently   Taking b12 sl Hx of uvpp  Review of Systems No fever  Sob   chonic sinus change  Since then   ? If any allergic sx .   Used benadryl .   Past history family history social history reviewed in the electronic medical record.     Objective:   Physical Exam BP 118/72  Pulse 85  Temp(Src) 98 F (36.7 C) (Oral)  Wt 200 lb (90.719 kg)  SpO2 98% Wt Readings from Last 3 Encounters:  12/29/11 200 lb (90.719 kg)  10/16/11 201 lb (91.173 kg)  06/23/11 214 lb (97.07 kg)   WDWN in nad looks well  Nl affect  HEENT: Normocephalic ;atraumatic , Eyes;  PERRL, EOMs  Full, lids and conjunctiva clear,,Ears: no deformities, canals nl, TM landmarks normal, Nose: no deformity mild contestion  Mouth : OP clear without lesion or edema . Uvula absent Neck: Supple without adenopathy or masses or bruits Chest:  Clear to A&P without wheezes rales or rhonchi CV:  S1-S2 no gallops or murmurs peripheral perfusion is normal Abdomen:  Sof,t normal bowel sounds without hepatosplenomegaly, no guarding rebound or masses no CVA tenderness Lab Results  Component Value Date   HGBA1C 6.2 12/22/2011       Assessment & Plan:   DM   Doing extremely well now in the prediabetic range  Off all meds   Said had pneumovax when in hospital   icu for perf bowel  BP Good readings  Bac,k and check per dr Shelle Iron  Very active in the meantime.   Mood per psych stable   Gout: no flares   On no lipid med had fatty liver hepatitis and elevated lfts with med in the past  Can disc at cpx  cpx and lab incuding b12  Dm labs and uric acid  In 6 months or prn.   Total visit > 50% spent counseling and coordinating care

## 2011-12-29 NOTE — Patient Instructions (Addendum)
You are doing well. Continue lifestyle intervention healthy eating and exercise .  Plan  Wellness check and  Labs  In 6 months . cpx labs hga1c urine microalbumin uric acid and b12

## 2011-12-29 NOTE — Assessment & Plan Note (Signed)
Doing great post gastric bypass

## 2011-12-31 ENCOUNTER — Other Ambulatory Visit: Payer: Self-pay | Admitting: Internal Medicine

## 2012-03-24 ENCOUNTER — Ambulatory Visit (INDEPENDENT_AMBULATORY_CARE_PROVIDER_SITE_OTHER): Payer: Federal, State, Local not specified - PPO | Admitting: Internal Medicine

## 2012-03-24 ENCOUNTER — Other Ambulatory Visit: Payer: Self-pay | Admitting: Family Medicine

## 2012-03-24 ENCOUNTER — Encounter: Payer: Self-pay | Admitting: Internal Medicine

## 2012-03-24 VITALS — BP 112/84 | HR 84 | Temp 98.5°F | Wt 200.0 lb

## 2012-03-24 DIAGNOSIS — D696 Thrombocytopenia, unspecified: Secondary | ICD-10-CM

## 2012-03-24 DIAGNOSIS — E538 Deficiency of other specified B group vitamins: Secondary | ICD-10-CM

## 2012-03-24 DIAGNOSIS — R238 Other skin changes: Secondary | ICD-10-CM

## 2012-03-24 DIAGNOSIS — E119 Type 2 diabetes mellitus without complications: Secondary | ICD-10-CM

## 2012-03-24 DIAGNOSIS — G4700 Insomnia, unspecified: Secondary | ICD-10-CM | POA: Insufficient documentation

## 2012-03-24 DIAGNOSIS — S0990XS Unspecified injury of head, sequela: Secondary | ICD-10-CM | POA: Insufficient documentation

## 2012-03-24 DIAGNOSIS — Z9884 Bariatric surgery status: Secondary | ICD-10-CM

## 2012-03-24 DIAGNOSIS — R233 Spontaneous ecchymoses: Secondary | ICD-10-CM

## 2012-03-24 DIAGNOSIS — G44309 Post-traumatic headache, unspecified, not intractable: Secondary | ICD-10-CM

## 2012-03-24 DIAGNOSIS — IMO0001 Reserved for inherently not codable concepts without codable children: Secondary | ICD-10-CM

## 2012-03-24 DIAGNOSIS — X58XXXS Exposure to other specified factors, sequela: Secondary | ICD-10-CM

## 2012-03-24 LAB — CBC WITH DIFFERENTIAL/PLATELET
Basophils Absolute: 0 10*3/uL (ref 0.0–0.1)
HCT: 44.9 % (ref 39.0–52.0)
Hemoglobin: 15.1 g/dL (ref 13.0–17.0)
Lymphs Abs: 2.3 10*3/uL (ref 0.7–4.0)
MCV: 95.6 fl (ref 78.0–100.0)
Monocytes Absolute: 0.3 10*3/uL (ref 0.1–1.0)
Monocytes Relative: 6.3 % (ref 3.0–12.0)
Neutro Abs: 2.5 10*3/uL (ref 1.4–7.7)
Platelets: 155 10*3/uL (ref 150.0–400.0)
RDW: 13.5 % (ref 11.5–14.6)

## 2012-03-24 LAB — HEPATIC FUNCTION PANEL
ALT: 20 U/L (ref 0–53)
AST: 22 U/L (ref 0–37)
Albumin: 4.8 g/dL (ref 3.5–5.2)
Total Bilirubin: 0.5 mg/dL (ref 0.3–1.2)

## 2012-03-24 LAB — HEMOGLOBIN A1C: Hgb A1c MFr Bld: 6 % (ref 4.6–6.5)

## 2012-03-24 LAB — BASIC METABOLIC PANEL
BUN: 16 mg/dL (ref 6–23)
CO2: 28 mEq/L (ref 19–32)
Calcium: 9.8 mg/dL (ref 8.4–10.5)
Chloride: 101 mEq/L (ref 96–112)
Creatinine, Ser: 1.1 mg/dL (ref 0.4–1.5)

## 2012-03-24 LAB — PROTIME-INR: Prothrombin Time: 11.2 s (ref 10.2–12.4)

## 2012-03-24 MED ORDER — GLUCOSE BLOOD VI STRP: ORAL_STRIP | Status: DC

## 2012-03-24 MED ORDER — AMITRIPTYLINE HCL 50 MG PO TABS: 50.0000 mg | ORAL_TABLET | Freq: Two times a day (BID) | ORAL | Status: DC

## 2012-03-24 NOTE — Progress Notes (Signed)
  Subjective:    Patient ID: Hector [name], male    DOB: September 04, 1961, 51 y.o.   MRN: [id number]  HPI Patient comes in today for SDA for  new problem evaluation. He has noted over about the last 6 weeks a tendency for bruising noticing some on his lower extremities and arms. Not really on his trunk no nosebleeds blood in his stool. He has also used a little bit more Indocin recently for some headaches and is taking 2 at a time that made him dizzy. He continues on Lexapro is not on aspirin or anti-inflammatories. He is out of his diabetes strips and asked for refill thinks his blood sugars up in okay due for a checkup in the fall.  He continues to maintain a lower weight and is physically active has some fatigue recently unsure if related. He is trying to switch over from Ambien to amitriptyline. Asks for refill to take bid or 2 instead of ambien He takes a daily multivitamin this is a gummy type and tries to remember to take his extra B12 and calcium. Review of Systems ROS:  GEN/ HEENT: No fever,  sweats headaches vision problems hearing changes, CV/ PULM; No chest pain shortness of breath cough, syncope,edema  change in exercise tolerance. GI /GU: No adominal pain, vomiting, change in bowel habits. No blood in the stool. No significant GU symptoms. SKIN/HEME: ,no acute skin rashes suspicious lesions or bleeding. No lymphadenopathy, nodules, masses.  NEURO/ PSYCH:  No neurologic signs such as weakness numbness. No depression anxiety. IMM/ Allergy: No unusual infections.  Allergy .   REST of 12 system review negative except as per HPI Past history family history social history reviewed in the electronic medical record.     Objective:   Physical Exam BP 112/84  Pulse 84  Temp 98.5 F (36.9 C) (Oral)  Wt 200 lb (90.719 kg)  SpO2 97% Wt Readings from Last 3 Encounters:  03/24/12 200 lb (90.719 kg)  12/29/11 200 lb (90.719 kg)  10/16/11 201 lb (91.173 kg)  WDWN in nad HEENT grossly normal op  clear LN: no cervical axillary inguinal adenopathy shoddy pc nodes  Chest:  Clear to A&P without wheezes rales or rhonchi CV:  S1-S2 no gallops or murmurs peripheral perfusion is normal Abdomen:  Sof,t normal bowel sounds without hepatosplenomegaly, no guarding rebound or masses no CVA tenderness No clubbing cyanosis or edema SKIN non icteric .   Small faded round bruising on shins few on arms  Trunk clear excep has small 1 cm faint bruise luq non tender  No petechiae.  No striae.  Neuro  Grossly non focal     Assessment & Plan:  Bruising and bleeding c/o  Normal exam  Ares involved in area of contact however one was on hand after using tools .  Poss med effect  To check plt  and clotting factors etc. . Otherwise no alarm to exam.   DM assess control has been good after surgery bypass Sp Bariatric surgery Hx of fatty  Liver. Labs returned to nl after surgery. HA s monitor  Sleep ok to wean from [place] and increase the tca for now. Hx of low b12 check today

## 2012-03-24 NOTE — Patient Instructions (Signed)
This could be nutritional or med related and you exam is otherwise normal.  Will notify you  of labs when available. Then plan follow up. lexa pro and indocin could make you bruise easier . Fat soluble vitamins can have decrease absorption  After bypass surgery.

## 2012-03-26 ENCOUNTER — Other Ambulatory Visit: Payer: Self-pay | Admitting: Family Medicine

## 2012-03-26 DIAGNOSIS — R233 Spontaneous ecchymoses: Secondary | ICD-10-CM

## 2012-03-26 DIAGNOSIS — R238 Other skin changes: Secondary | ICD-10-CM

## 2012-03-26 DIAGNOSIS — E559 Vitamin D deficiency, unspecified: Secondary | ICD-10-CM

## 2012-03-26 DIAGNOSIS — D689 Coagulation defect, unspecified: Secondary | ICD-10-CM

## 2012-03-26 LAB — COPPER, SERUM: Copper: 90 ug/dL (ref 70–175)

## 2012-03-26 LAB — VITAMIN B1: Vitamin B1 (Thiamine): 279 nmol/L — ABNORMAL HIGH (ref 8–30)

## 2012-03-26 MED ORDER — ERGOCALCIFEROL 1.25 MG (50000 UT) PO CAPS: 50000.0000 [IU] | ORAL_CAPSULE | ORAL | Status: DC

## 2012-03-26 MED ORDER — VITAMIN D 1000 UNITS PO CAPS: 1000.0000 [IU] | ORAL_CAPSULE | Freq: Every day | ORAL | Status: DC

## 2012-03-30 ENCOUNTER — Telehealth: Payer: Self-pay | Admitting: Oncology

## 2012-03-30 NOTE — Telephone Encounter (Signed)
S/w the pt and he is aware of the new pt appt with dr Cyndie Chime in aug per pt's request

## 2012-04-24 ENCOUNTER — Other Ambulatory Visit: Payer: Self-pay | Admitting: Internal Medicine

## 2012-04-28 ENCOUNTER — Encounter: Payer: Federal, State, Local not specified - PPO | Admitting: Oncology

## 2012-04-28 ENCOUNTER — Ambulatory Visit: Payer: Federal, State, Local not specified - PPO

## 2012-04-28 ENCOUNTER — Other Ambulatory Visit: Payer: Federal, State, Local not specified - PPO | Admitting: Lab

## 2012-05-03 ENCOUNTER — Telehealth: Payer: Self-pay | Admitting: Oncology

## 2012-05-03 NOTE — Telephone Encounter (Signed)
Delivered chart on 8/19 for appt. 8/28 to Dr. Cyndie Chime

## 2012-05-12 ENCOUNTER — Other Ambulatory Visit (HOSPITAL_BASED_OUTPATIENT_CLINIC_OR_DEPARTMENT_OTHER): Payer: Federal, State, Local not specified - PPO | Admitting: Lab

## 2012-05-12 ENCOUNTER — Ambulatory Visit: Payer: Federal, State, Local not specified - PPO

## 2012-05-12 ENCOUNTER — Telehealth: Payer: Self-pay | Admitting: Oncology

## 2012-05-12 ENCOUNTER — Ambulatory Visit (HOSPITAL_BASED_OUTPATIENT_CLINIC_OR_DEPARTMENT_OTHER): Payer: Federal, State, Local not specified - PPO | Admitting: Oncology

## 2012-05-12 VITALS — BP 137/94 | HR 78 | Temp 99.6°F | Resp 20 | Ht 70.0 in | Wt 205.3 lb

## 2012-05-12 DIAGNOSIS — R238 Other skin changes: Secondary | ICD-10-CM

## 2012-05-12 DIAGNOSIS — D696 Thrombocytopenia, unspecified: Secondary | ICD-10-CM

## 2012-05-12 DIAGNOSIS — R233 Spontaneous ecchymoses: Secondary | ICD-10-CM

## 2012-05-12 LAB — CBC & DIFF AND RETIC
BASO%: 0.4 % (ref 0.0–2.0)
Basophils Absolute: 0 10*3/uL (ref 0.0–0.1)
Eosinophils Absolute: 0.2 10*3/uL (ref 0.0–0.5)
HCT: 41.8 % (ref 38.4–49.9)
HGB: 14.1 g/dL (ref 13.0–17.1)
Immature Retic Fract: 3.6 % (ref 3.00–10.60)
MONO#: 0.4 10*3/uL (ref 0.1–0.9)
NEUT#: 2.2 10*3/uL (ref 1.5–6.5)
NEUT%: 46.4 % (ref 39.0–75.0)
Platelets: 149 10*3/uL (ref 140–400)
Retic Ct Abs: 42.37 10*3/uL (ref 34.80–93.90)
WBC: 4.7 10*3/uL (ref 4.0–10.3)
lymph#: 2 10*3/uL (ref 0.9–3.3)

## 2012-05-12 LAB — MORPHOLOGY

## 2012-05-12 NOTE — Telephone Encounter (Signed)
Per 8/28 pof return PRN - pt aware.

## 2012-05-12 NOTE — Progress Notes (Signed)
New Patient Hematology-Oncology Evaluation   Hector [name] [id number] 10-27-1960 51 y.o. 05/12/2012  CC: Dr. Berniece Andreas; Dr. Yolonda Kida; Dr. 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 He noted spontaneous bruising of his arms and legs over the last 2-3 months. In addition to his day job, he runs a small farm. He is always bumping into things. He has not had any other clinical bleeding and denies any recent epistaxis, hematuria, hematochezia or melena. He is not taking any long-term steroids but did have a Medrol Dosepak about 3 months ago during an episode of bronchitis. He does use prn indomethacin for a flareup of gout. He does not use alcohol.   He has had multiple surgical procedures over the years including a gastric bypass surgery in February 2011 to control weight, emergency surgery due to a perforated ulcer in September 2011, prior back surgery, umbilicus hernia surgery, foot surgery, nasal septoplasty, carpal tunnel surgery, left ulnar nerve transposition, uvuloplasty with tonsillectomy and adenoidectomy, and multiple dental extractions. None of these procedures have been associated with increased bleeding.  He has had intermittent mild thrombocytopenia with lowest platelet count recorded 122,000 in September of 2011. There is no family history of bleeding in his parents both of whom are still alive, his sister age 6, his children a son age 22 and a daughter age 39.  Initial evaluation by his primary care physician included a vitamin D level which was very low. He was put on vitamin D supplementation and the bruising did decrease. He tells me he got bit by a black widow spider on his leg and developed a large area of ecchymosis but this was subsequent to the initiation of the bruising episodes.  Labs done through his primary care  physician's office on July 12 showed a protime of 11.2 seconds control 10.2-12.4, PTT 31.3 seconds control 21.7-28.8. Normal liver functions. CBC with hemoglobin 15 hematocrit 45 MCV 96 white count 5400 46 neutrophils 43 lymphocytes 6 monocytes 4 eosinophils platelet count 155,000.  CBC in our office today with hemoglobin 14 hematocrit 42 MCV 94 white count 4700, 46 neutrophils, 43 lymphocytes, 7 monocytes, 3 eosinophils and platelet count 149,000. Repeat PTT in our office today is normal at 33 seconds with our lab control up to 37 seconds. 2 prior PTT values done on 06/08/2010 and on 01/20/2011 were both normal.    PMH: Past Medical History  Diagnosis Date  . Diabetes mellitus   . Hyperlipidemia   . Abnormal LFTs     improved after gastic bypass  . History of concussion   . Gout   . Sleep apnea   . Perforated ulcer     Surgical repair9/24 2011  . B12 nutritional deficiency   . Closed head injury     laceration  9 12  no loc  vision change  He denies any history of hepatitis, mononucleosis, although he was exposed to his wife with mononucleosis many years ago. He has had no recent viral illness.  Past Surgical History  Procedure Date  . Vasectomy   . Hernia repair     umbilical   . Nasal septoplasty w/ turbinoplasty     x 2  . Gastric bypass 11/06/09    beginning weight 267  . Epidural block injection     by Dr. Ethelene Hal  . Surgerical repair of stomach ulcer 06/08/10  per  . Back surgery     Dr. Shelle Iron    Allergies: Allergies  Allergen Reactions  . Aspirin     REACTION: Pt had a allergic reaction as a perforated ulcer    Medications: Endocet 5/325 one to 2 every 4 hours when necessary pain, Lexapro 20 mg daily, Lamictal 150 mg daily, Robaxin 750 mg every 8 hours when necessary muscles thousand, Elavil 50 mg at bedtime, vitamin D 1000 units one capsule daily, B complex vitamin with vitamin C one daily, Ambien 10 mg at bedtime when necessary sleep, Prilosec 20 mg one daily,  Indocin 50 mg every 8 hours when necessary, calcium 950 mg daily.   Social History:   reports that he has never smoked. He does not have any smokeless tobacco history on file. He reports that he does not drink alcohol or use illicit drugs.  Family History: See history of present illness Family History  Problem Relation Age of Onset  . Uterine cancer Mother   . Hypertension Mother   . Cervical cancer Mother   . Heart disease Father   . COPD Father     Review of Systems: Constitutional symptoms: No constitutional symptoms HEENT: Occasional nosebleed which he was told was related to previous nasal septoplasty surgery Respiratory: No cough or dyspnea Cardiovascular: No chest pain, pressure, or palpitations  Gastrointestinal ROS: No abdominal pain Genito-Urinary ROS: No urinary tract symptoms Hematological and Lymphatic: No swollen glands Musculoskeletal: Chronic low back and neck pain Neurologic: No chronic headaches, no change in vision, he started to get some paresthesias in both of his hands Dermatologic: See above Remaining ROS negative.  Physical Exam: Blood pressure 137/94, pulse 78, temperature 99.6 F (37.6 C), temperature source Oral, resp. rate 20, height 5\' 10"  (1.778 m), weight 205 lb 4.8 oz (93.123 kg). Wt Readings from Last 3 Encounters:  05/12/12 205 lb 4.8 oz (93.123 kg)  03/24/12 200 lb (90.719 kg)  12/29/11 200 lb (90.719 kg)    General appearance: Well-nourished Caucasian man Head: Normal Neck: Full range of motion Lymph nodes: No lymphadenopathy Breasts: Lungs: Clear to auscultation resonant to percussion Heart: Regular rhythm no murmur Abdominal: Soft nontender multiple surgical scars no mass no organomegaly GU: Not examined Extremities: No edema no calf tenderness Neurologic: Pupils reactive to light. Optic disc sharp and vessels normal. Motor strength 5 over 5. Reflexes 1+ symmetric. Sensation intact to vibration over the fingertips by tuning fork  exam. Skin: No ecchymoses no hemangiomas he has a dermatitis on the nose.    Lab Results: Lab Results  Component Value Date   WBC 4.7 05/12/2012   HGB 14.1 05/12/2012   HCT 41.8 05/12/2012   MCV 93.7 05/12/2012   PLT 149 05/12/2012     Chemistry      Component Value Date/Time   NA 139 03/24/2012 1016   K 4.3 03/24/2012 1016   CL 101 03/24/2012 1016   CO2 28 03/24/2012 1016   BUN 16 03/24/2012 1016   CREATININE 1.1 03/24/2012 1016      Component Value Date/Time   CALCIUM 9.8 03/24/2012 1016   ALKPHOS 81 03/24/2012 1016   AST 22 03/24/2012 1016   ALT 20 03/24/2012 1016   BILITOT 0.5 03/24/2012 1016       Review of peripheral blood film: Normochromic normocytic red cells. Mature neutrophils and lymphocytes with a subpopulation of benign reactive lymphocytes. Majority of platelets are large but have normal granulation.    Impression and Plan: #1. Transient easy bruisability which  has now resolved. No evidence for any coagulopathy or a leukemic process. I did send off a von Willebrand's profile in view of the single slight elevation of a random PTT test and fluctuating mild thrombocytopenia but I expect that these will be normal given his negative family history and the fact that he has never had any bleeding episodes following multiple major surgeries. We will call him when the results are available.  #2. Fluctuating mild thrombocytopenia. He has large platelets and many of these are going to be erroneously counted as a larger cells on the lab analyzer. This will cause a spurious reading of a lower platelet count. Review of the peripheral blood shows he has normal number of platelets. Platelet morphology does not suggest a congenital platelet abnormality.      Levert Feinstein, MD 05/12/2012, 8:10 PM

## 2012-05-14 LAB — VON WILLEBRAND PANEL: Coagulation Factor VIII: 45 % — ABNORMAL LOW (ref 73–140)

## 2012-05-19 ENCOUNTER — Encounter: Payer: Self-pay | Admitting: Internal Medicine

## 2012-06-16 ENCOUNTER — Telehealth: Payer: Self-pay | Admitting: Internal Medicine

## 2012-06-16 NOTE — Telephone Encounter (Signed)
Pt called and is req to get a early morning cpx on a Monday morning, preferrable before end of year with fasting labs prior. Pls advise?

## 2012-06-17 ENCOUNTER — Other Ambulatory Visit: Payer: Self-pay | Admitting: Family Medicine

## 2012-06-17 DIAGNOSIS — Z0000 Encounter for general adult medical examination without abnormal findings: Secondary | ICD-10-CM

## 2012-06-17 NOTE — Telephone Encounter (Signed)
Pt scheduled for CPE on 12/23 @ 8:45 and labs on 12/16 @ 9:30.

## 2012-06-21 ENCOUNTER — Ambulatory Visit: Payer: Federal, State, Local not specified - PPO | Admitting: Internal Medicine

## 2012-06-21 ENCOUNTER — Other Ambulatory Visit: Payer: Federal, State, Local not specified - PPO

## 2012-06-24 ENCOUNTER — Other Ambulatory Visit: Payer: Self-pay | Admitting: Internal Medicine

## 2012-06-25 ENCOUNTER — Other Ambulatory Visit: Payer: Self-pay | Admitting: Family Medicine

## 2012-06-25 DIAGNOSIS — E349 Endocrine disorder, unspecified: Secondary | ICD-10-CM

## 2012-06-28 ENCOUNTER — Encounter: Payer: Federal, State, Local not specified - PPO | Admitting: Internal Medicine

## 2012-06-30 ENCOUNTER — Telehealth: Payer: Self-pay | Admitting: Internal Medicine

## 2012-06-30 NOTE — Telephone Encounter (Signed)
Pt is aware may take up to 3 days for refills

## 2012-06-30 NOTE — Telephone Encounter (Signed)
Scripts sent to pharmacy 

## 2012-06-30 NOTE — Telephone Encounter (Signed)
Patient called stating that he has been trying to get a refill of his indomethacin at [facility] [address] and they are telling him that there has been no response from our office. Please assist and inform patient when done.

## 2012-07-01 ENCOUNTER — Telehealth: Payer: Self-pay | Admitting: Oncology

## 2012-07-01 ENCOUNTER — Other Ambulatory Visit: Payer: Self-pay | Admitting: Oncology

## 2012-07-01 DIAGNOSIS — R233 Spontaneous ecchymoses: Secondary | ICD-10-CM

## 2012-07-01 DIAGNOSIS — D696 Thrombocytopenia, unspecified: Secondary | ICD-10-CM

## 2012-07-01 DIAGNOSIS — D66 Hereditary factor VIII deficiency: Secondary | ICD-10-CM

## 2012-07-01 DIAGNOSIS — R238 Other skin changes: Secondary | ICD-10-CM

## 2012-07-01 NOTE — Telephone Encounter (Signed)
talked to patient he is aware of time and date of lab on 07/05/12

## 2012-07-05 ENCOUNTER — Other Ambulatory Visit: Payer: Self-pay | Admitting: Oncology

## 2012-07-05 ENCOUNTER — Other Ambulatory Visit: Payer: Federal, State, Local not specified - PPO | Admitting: Lab

## 2012-07-05 ENCOUNTER — Other Ambulatory Visit (HOSPITAL_BASED_OUTPATIENT_CLINIC_OR_DEPARTMENT_OTHER): Payer: Federal, State, Local not specified - PPO

## 2012-07-05 DIAGNOSIS — R238 Other skin changes: Secondary | ICD-10-CM

## 2012-07-12 ENCOUNTER — Ambulatory Visit: Payer: Federal, State, Local not specified - PPO | Admitting: Internal Medicine

## 2012-07-27 LAB — OTHER SOLSTAS TEST

## 2012-08-30 ENCOUNTER — Other Ambulatory Visit (INDEPENDENT_AMBULATORY_CARE_PROVIDER_SITE_OTHER): Payer: Federal, State, Local not specified - PPO

## 2012-08-30 DIAGNOSIS — Z0000 Encounter for general adult medical examination without abnormal findings: Secondary | ICD-10-CM

## 2012-08-31 LAB — CBC WITH DIFFERENTIAL/PLATELET
Basophils Absolute: 0 10*3/uL (ref 0.0–0.1)
Eosinophils Absolute: 0.2 10*3/uL (ref 0.0–0.7)
Lymphocytes Relative: 36.1 % (ref 12.0–46.0)
MCHC: 32.7 g/dL (ref 30.0–36.0)
Monocytes Relative: 6.7 % (ref 3.0–12.0)
Neutro Abs: 3.4 10*3/uL (ref 1.4–7.7)
Platelets: 152 10*3/uL (ref 150.0–400.0)
RDW: 14.7 % — ABNORMAL HIGH (ref 11.5–14.6)

## 2012-08-31 LAB — HEPATIC FUNCTION PANEL
ALT: 15 U/L (ref 0–53)
AST: 22 U/L (ref 0–37)
Albumin: 5.1 g/dL (ref 3.5–5.2)
Alkaline Phosphatase: 66 U/L (ref 39–117)
Bilirubin, Direct: 0.1 mg/dL (ref 0.0–0.3)
Total Protein: 7.5 g/dL (ref 6.0–8.3)

## 2012-08-31 LAB — LIPID PANEL
Cholesterol: 164 mg/dL (ref 0–200)
Triglycerides: 170 mg/dL — ABNORMAL HIGH (ref 0.0–149.0)

## 2012-08-31 LAB — BASIC METABOLIC PANEL
BUN: 11 mg/dL (ref 6–23)
Calcium: 9.6 mg/dL (ref 8.4–10.5)
Chloride: 100 mEq/L (ref 96–112)
Creatinine, Ser: 1.1 mg/dL (ref 0.4–1.5)
GFR: 73.48 mL/min (ref >60.00)

## 2012-08-31 LAB — TSH: TSH: 1.24 u[IU]/mL (ref 0.35–5.50)

## 2012-09-03 ENCOUNTER — Other Ambulatory Visit: Payer: Self-pay | Admitting: Oncology

## 2012-09-03 DIAGNOSIS — D696 Thrombocytopenia, unspecified: Secondary | ICD-10-CM

## 2012-09-03 DIAGNOSIS — R238 Other skin changes: Secondary | ICD-10-CM

## 2012-09-03 DIAGNOSIS — R233 Spontaneous ecchymoses: Secondary | ICD-10-CM

## 2012-09-06 ENCOUNTER — Telehealth: Payer: Self-pay | Admitting: Oncology

## 2012-09-06 ENCOUNTER — Ambulatory Visit (INDEPENDENT_AMBULATORY_CARE_PROVIDER_SITE_OTHER): Payer: Federal, State, Local not specified - PPO | Admitting: Internal Medicine

## 2012-09-06 ENCOUNTER — Encounter: Payer: Self-pay | Admitting: Internal Medicine

## 2012-09-06 VITALS — BP 128/88 | HR 90 | Temp 98.1°F | Ht 69.75 in | Wt 199.0 lb

## 2012-09-06 DIAGNOSIS — Z862 Personal history of diseases of the blood and blood-forming organs and certain disorders involving the immune mechanism: Secondary | ICD-10-CM

## 2012-09-06 DIAGNOSIS — S0990XS Unspecified injury of head, sequela: Secondary | ICD-10-CM

## 2012-09-06 DIAGNOSIS — M249 Joint derangement, unspecified: Secondary | ICD-10-CM

## 2012-09-06 DIAGNOSIS — Z8739 Personal history of other diseases of the musculoskeletal system and connective tissue: Secondary | ICD-10-CM

## 2012-09-06 DIAGNOSIS — D696 Thrombocytopenia, unspecified: Secondary | ICD-10-CM

## 2012-09-06 DIAGNOSIS — G4733 Obstructive sleep apnea (adult) (pediatric): Secondary | ICD-10-CM

## 2012-09-06 DIAGNOSIS — Z9884 Bariatric surgery status: Secondary | ICD-10-CM

## 2012-09-06 DIAGNOSIS — E119 Type 2 diabetes mellitus without complications: Secondary | ICD-10-CM

## 2012-09-06 DIAGNOSIS — E538 Deficiency of other specified B group vitamins: Secondary | ICD-10-CM

## 2012-09-06 DIAGNOSIS — Z23 Encounter for immunization: Secondary | ICD-10-CM

## 2012-09-06 DIAGNOSIS — G44309 Post-traumatic headache, unspecified, not intractable: Secondary | ICD-10-CM

## 2012-09-06 DIAGNOSIS — M109 Gout, unspecified: Secondary | ICD-10-CM

## 2012-09-06 DIAGNOSIS — IMO0001 Reserved for inherently not codable concepts without codable children: Secondary | ICD-10-CM

## 2012-09-06 DIAGNOSIS — Z8639 Personal history of other endocrine, nutritional and metabolic disease: Secondary | ICD-10-CM

## 2012-09-06 DIAGNOSIS — Z0000 Encounter for general adult medical examination without abnormal findings: Secondary | ICD-10-CM

## 2012-09-06 MED ORDER — INDOMETHACIN 50 MG PO CAPS: ORAL_CAPSULE | ORAL | Status: DC

## 2012-09-06 MED ORDER — AMITRIPTYLINE HCL 50 MG PO TABS: 50.0000 mg | ORAL_TABLET | Freq: Every evening | ORAL | Status: DC | PRN

## 2012-09-06 MED ORDER — INDOMETHACIN 50 MG PO CAPS: 50.0000 mg | ORAL_CAPSULE | Freq: Three times a day (TID) | ORAL | Status: DC

## 2012-09-06 NOTE — Telephone Encounter (Signed)
s.w. pt and gv appt d/t for 1.13.14...Marland Kitchenpt aware

## 2012-09-06 NOTE — Progress Notes (Signed)
Chief Complaint  Patient presents with  . Annual Exam    Needs refills of amitriptyline and indomethacin.  . Diabetes  . Headache    HPI: Patient comes in today for Preventive Health Care visit  And fu medication management . No major change in health status since last visit . Low platelets.  Evaluated dr Cyndie Chime ? 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  No intervention needed To have surgery right. shoulder  Jan 2nd.  Torn rotator  Cuff and labral disease . Dr Thomasena Edis  Gout once every 3 months.Mild in great toe   Needs refill indocin .works very well   Also uses for HAs if needed.  More with pain meds.  For shoulder.  Issue  ROS:  GEN/ HEENT: No fever, significant weight changes sweats headaches vision problems gets eye checks hearing changes, CV/ PULM; No chest pain shortness of breath cough, syncope,edema  change in exercise tolerance. GI /GU: No adominal pain, vomiting, change in bowel habits. No blood in the stool. No significant GU symptoms. SKIN/HEME: ,no acute skin rashes suspicious lesions or bleeding. No lymphadenopathy, nodules, masses.  NEURO/ PSYCH:  No neurologic signs such as weakness numbness.  On meds for mood and pain  Functioning well  IMM/ Allergy: No unusual infections.  Allergy .   REST of 12 system review negative except as per HPI Had renal stone in the past year or 2 no fu needed didn't go to urologist  Past Medical History  Diagnosis Date  . Diabetes mellitus   . Hyperlipidemia   . Abnormal LFTs     improved after gastic bypass  . History of concussion   . Gout   . Sleep apnea   . Perforated ulcer     Surgical repair9/24 2011  . B12 nutritional deficiency   . Closed head injury     laceration  9 12  no loc  vision change  . History of renal stone     Seen in emergency room not urologist    Family History  Problem Relation Age of Onset  . Uterine cancer Mother   . Hypertension Mother   . Cervical cancer Mother   . Heart disease Father   . COPD  Father     History   Social History  . Marital Status: Married    Spouse Name: N/A    Number of Children: N/A  . Years of Education: N/A   Social History Main Topics  . Smoking status: Never Smoker   . Smokeless tobacco: None  . Alcohol Use: No     Comment: socially  . Drug Use: No  . Sexually Active: None   Other Topics Concern  . 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 albuterol (PROVENTIL,VENTOLIN) 90 MCG/ACT inhaler Inhale 2 puffs into the lungs every 6 (six) hours as needed. For shortness of breath.      [name] amitriptyline (ELAVIL) 50 MG tablet Take 1 tablet (50 mg total) by mouth at bedtime as needed for sleep. TAKE 1 TABLET (50 MG TOTAL) BY MOUTH AT BEDTIME AS NEEDED FOR SLEEP.  90 tablet  3  . B Complex-C (B-COMPLEX WITH VITAMIN C) tablet Take 1 tablet by mouth daily.      . calcium citrate (CALCITRATE) 950 MG tablet Take 1 tablet by mouth daily.       [name]  Cholecalciferol (VITAMIN D) 1000 UNITS capsule Take 1 capsule (1,000 Units total) by mouth daily.  30 capsule  11  . ENDOCET 5-325 MG per tablet Take 1 tablet by mouth every 8 (eight) hours as needed. For pain      . escitalopram (LEXAPRO) 20 MG tablet Take 20 mg by mouth daily.      [name] glucose blood (ONE TOUCH TEST STRIPS) test strip Use as instructed  100 each  12  . indomethacin (INDOCIN) 50 MG capsule Take 1 capsule (50 mg total) by mouth 3 (three) times daily with meals. TAKE ONE CAPSULE BY MOUTH 3 TIMES A DAYas needed for gout and headaches  40 capsule  1  . indomethacin (INDOCIN) 50 MG capsule TAKE ONE CAPSULE BY MOUTH 3 TIMES A DAY AS NEEDED.  40 capsule  1  . lamoTRIgine (LAMICTAL) 150 MG tablet Take 150 mg by mouth every morning.        . methocarbamol (ROBAXIN) 750 MG tablet Take 750 mg by mouth 4 (four) times daily as needed. Back  pain.      . Multiple Vitamins-Minerals (ADULT ONE DAILY GUMMIES) CHEW Chew 1 tablet by mouth daily.       [name] omeprazole (PRILOSEC) 20 MG capsule Take 1 capsule (20 mg total) by mouth daily.  21 capsule  0  . zolpidem (AMBIEN) 10 MG tablet Take 10 mg by mouth at bedtime as needed.       . [DISCONTINUED] amitriptyline (ELAVIL) 50 MG tablet TAKE 1 TABLET (50 MG TOTAL) BY MOUTH AT BEDTIME AS NEEDED FOR SLEEP.  90 tablet  0  . [DISCONTINUED] indomethacin (INDOCIN) 50 MG capsule TAKE ONE CAPSULE BY MOUTH 3 TIMES A DAY  40 capsule  1  . Melatonin 5 MG SUBL Place 1 tablet under the tongue at bedtime.      . [DISCONTINUED] amitriptyline (ELAVIL) 50 MG tablet Take 1 tablet (50 mg total) by mouth 2 (two) times daily.  180 tablet  0  . [DISCONTINUED] indomethacin (INDOCIN) 50 MG capsule Take 50 mg by mouth every 8 (eight) hours as needed. For gout.        EXAM:  BP 128/88  Pulse 90  Temp 98.1 F (36.7 C) (Oral)  Ht 5' 9.75" (1.772 m)  Wt 199 lb (90.266 kg)  BMI 28.76 kg/m2  SpO2 98%  Body mass index is 28.76 kg/(m^2). Wt Readings from Last 3 Encounters:  09/06/12 199 lb (90.266 kg)  05/12/12 205 lb 4.8 oz (93.123 kg)  03/24/12 200 lb (90.719 kg)    Physical Exam: Vital signs reviewed ZOX:[id number] is a well-developed well-nourished alert cooperative   male who appears her stated age in no acute distress.  HEENT: normocephalic atraumatic , Eyes: PERRL EOM's full, conjunctiva clear, Nares: paten,t no deformity discharge or tenderness., Ears: no deformity EAC's clear TMs with normal landmarks. Mouth: clear OP, no lesions, edema.  Moist mucous membranes. Dentition in adequate repair. NECK: supple without masses, thyromegaly or bruits. CHEST/PULM:  Clear to auscultation and percussion breath sounds equal no wheeze , rales or rhonchi. No chest wall deformities or tenderness. CV: PMI is nondisplaced, S1 S2 no gallops, murmurs, rubs. Peripheral pulses are full without delay.No JVD .  ABDOMEN: Bowel sounds  normal nontender  No guard or rebound, no hepato splenomegal no CVA tenderness.  No hernia. Well healed scars  Extremtities:  No clubbing cyanosis or edema, no acute joint swelling or redness no focal atrophy NEURO:  Oriented x3, cranial nerves 3-12 appear to  be intact, no obvious focal weakness,gait within normal limits no abnormal reflexes  SKIN: No acute rashes normal turgor, color, no bruising or petechiae.few papules on back SD face  PSYCH: Oriented, good eye contact, no obvious depression anxiety, cognition and judgment appear normal. LN: no cervical axillary inguinal adenopathy  Lab Results  Component Value Date   WBC 6.5 08/30/2012   HGB 15.1 08/30/2012   HCT 46.2 08/30/2012   PLT 152.0 08/30/2012   GLUCOSE 116* 08/30/2012   CHOL 164 08/30/2012   TRIG 170.0* 08/30/2012   HDL 44.60 08/30/2012   LDLDIRECT 113.2 04/01/2007   LDLCALC 85 08/30/2012   ALT 15 08/30/2012   AST 22 08/30/2012   NA 141 08/30/2012   K 4.5 08/30/2012   CL 100 08/30/2012   CREATININE 1.1 08/30/2012   BUN 11 08/30/2012   CO2 32 08/30/2012   TSH 1.24 08/30/2012   PSA 1.01 08/30/2012   INR 1.0 03/24/2012   HGBA1C 6.0 03/24/2012   MICROALBUR 0.7 05/11/2009    ASSESSMENT AND PLAN:  Discussed the following assessment and plan:  1. Visit for preventive health examination     flu and prevnar 13 today  2. Need for prophylactic vaccination and inoculation against influenza    3. 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 Bariatric surgery status    5. Headaches due to old head injury    6. B12 DEFICIENCY     takin oral and SL elevated mcv check folate and b12 next labs   7. History of gout     may need prophy if recurrent disc this get UA at next lab   8. Need for pneumococcal vaccination  Pneumococcal conjugate vaccine 13-valent less than 5yo IM  9. GOUT    10. OBSTRUCTIVE SLEEP APNEA     better since weight loss ? if needs machine at all he can adress this after surgery and  recovering  11. DIABETES MELLITUS, TYPE II     quiescent since weight loss  need a1c at next lab tests   his disease states her so much better since he has lost weight from his own effort and the bariatric surgery he is at risk for nutritional deficiency however appears to do well will keep a tab on B12 and vitamin D. Headaches respond well to Indocin followup on gout later. After patient left noted that there was no colonoscopy documented in healthcare maintenance I had assumed he had had one with his past history of GI bleeding etc. we'll ask them at next visit to make sure he is up-to-date for colon cancer screening. Patient Care Team: Madelin Headings, MD as PCP - General Javier Docker, MD (Orthopedic Surgery) Levert Feinstein, MD as Consulting Physician (Hematology and Oncology) Patient Instructions  Continue lifestyle intervention healthy eating and exercise . Continue b12 supplements  Monitor gout sx and usage of meds.  Plan labs to include B12  Folic acid cbcdiff vit D uric acid and hg a1c in about 3 months and then ROV to review or as needed.  No CI to surgery .   Heme as per Dr Cyndie Chime. prevnar 13 and flu vaccine today.    Preventive Care for Adults, Male A healthy lifestyle and preventive care can promote health and wellness. Preventive health guidelines for men include the following key practices:  A routine yearly physical is a good way to check with your caregiver about your health and preventative screening. It  is a chance to share any concerns and updates on your health, and to receive a thorough exam.  Visit your dentist for a routine exam and preventative care every 6 months. Brush your teeth twice a day and floss once a day. Good oral hygiene prevents tooth decay and gum disease.  The frequency of eye exams is based on your age, health, family medical history, use of contact lenses, and other factors. Follow your caregiver's recommendations for frequency of eye  exams.  Eat a healthy diet. Foods like vegetables, fruits, whole grains, low-fat dairy products, and lean protein foods contain the nutrients you need without too many calories. Decrease your intake of foods high in solid fats, added sugars, and salt. Eat the right amount of calories for you.Get information about a proper diet from your caregiver, if necessary.  Regular physical exercise is one of the most important things you can do for your health. Most adults should get at least 150 minutes of moderate-intensity exercise (any activity that increases your heart rate and causes you to sweat) each week. In addition, most adults need muscle-strengthening exercises on 2 or more days a week.  Maintain a healthy weight. The body mass index (BMI) is a screening tool to identify possible weight problems. It provides an estimate of body fat based on height and weight. Your caregiver can help determine your BMI, and can help you achieve or maintain a healthy weight.For adults 20 years and older:  A BMI below 18.5 is considered underweight.  A BMI of 18.5 to 24.9 is normal.  A BMI of 25 to 29.9 is considered overweight.  A BMI of 30 and above is considered obese.  Maintain normal blood lipids and cholesterol levels by exercising and minimizing your intake of saturated fat. Eat a balanced diet with plenty of fruit and vegetables. Blood tests for lipids and cholesterol should begin at age 56 and be repeated every 5 years. If your lipid or cholesterol levels are high, you are over 50, or you are a high risk for heart disease, you may need your cholesterol levels checked more frequently.Ongoing high lipid and cholesterol levels should be treated with medicines if diet and exercise are not effective.  If you smoke, find out from your caregiver how to quit. If you do not use tobacco, do not start.  If you choose to drink alcohol, do not exceed 2 drinks per day. One drink is considered to be 12 ounces (355  mL) of beer, 5 ounces (148 mL) of wine, or 1.5 ounces (44 mL) of liquor.  Avoid use of street drugs. Do not share needles with anyone. Ask for help if you need support or instructions about stopping the use of drugs.  High blood pressure causes heart disease and increases the risk of stroke. Your blood pressure should be checked at least every 1 to 2 years. Ongoing high blood pressure should be treated with medicines, if weight loss and exercise are not effective.  If you are 47 to 50 years old, ask your caregiver if you should take aspirin to prevent heart disease.  Diabetes screening involves taking a blood sample to check your fasting blood sugar level. This should be done once every 3 years, after age 45, if you are within normal weight and without risk factors for diabetes. Testing should be considered at a younger age or be carried out more frequently if you are overweight and have at least 1 risk factor for diabetes.  Colorectal cancer can  be detected and often prevented. Most routine colorectal cancer screening begins at the age of 57 and continues through age 95. However, your caregiver may recommend screening at an earlier age if you have risk factors for colon cancer. On a yearly basis, your caregiver may provide home test kits to check for hidden blood in the stool. Use of a small camera at the end of a tube, to directly examine the colon (sigmoidoscopy or colonoscopy), can detect the earliest forms of colorectal cancer. Talk to your caregiver about this at age 48, when routine screening begins. Direct examination of the colon should be repeated every 5 to 10 years through age 63, unless early forms of pre-cancerous polyps or small growths are found.  Hepatitis C blood testing is recommended for all people born from [birth year] through 1965 and any individual with known risks for hepatitis C.  Practice safe sex. Use condoms and avoid high-risk sexual practices to reduce the spread of sexually  transmitted infections (STIs). STIs include gonorrhea, chlamydia, syphilis, trichomonas, herpes, HPV, and human immunodeficiency virus (HIV). Herpes, HIV, and HPV are viral illnesses that have no cure. They can result in disability, cancer, and death.  A one-time screening for abdominal aortic aneurysm (AAA) and surgical repair of large AAAs by sound wave imaging (ultrasonography) is recommended for ages 67 to 44 years who are current or former smokers.  Healthy men should no longer receive prostate-specific antigen (PSA) blood tests as part of routine cancer screening. Consult with your caregiver about prostate cancer screening.  Testicular cancer screening is not recommended for adult males who have no symptoms. Screening includes self-exam, caregiver exam, and other screening tests. Consult with your caregiver about any symptoms you have or any concerns you have about testicular cancer.  Use sunscreen with skin protection factor (SPF) of 30 or more. Apply sunscreen liberally and repeatedly throughout the day. You should seek shade when your shadow is shorter than you. Protect yourself by wearing long sleeves, pants, a wide-brimmed hat, and sunglasses year round, whenever you are outdoors.  Once a month, do a whole body skin exam, using a mirror to look at the skin on your back. Notify your caregiver of new moles, moles that have irregular borders, moles that are larger than a pencil eraser, or moles that have changed in shape or color.  Stay current with required immunizations.  Influenza. You need a dose every fall (or winter). The composition of the flu vaccine changes each year, so being vaccinated once is not enough.  Pneumococcal polysaccharide. You need 1 to 2 doses if you smoke cigarettes or if you have certain chronic medical conditions. You need 1 dose at age 82 (or older) if you have never been vaccinated.  Tetanus, diphtheria, pertussis (Tdap, Td). Get 1 dose of Tdap vaccine if you  are younger than age 79 years, are over 4 and have contact with an infant, are a Research scientist (physical sciences), or simply want to be protected from whooping cough. After that, you need a Td booster dose every 10 years. Consult your caregiver if you have not had at least 3 tetanus and diphtheria-containing shots sometime in your life or have a deep or dirty wound.  HPV. This vaccine is recommended for males 13 through 50 years of age. This vaccine may be given to men 22 through 50 years of age who have not completed the 3 dose series. It is recommended for men through age 37 who have sex with men or whose immune system  is weakened because of HIV infection, other illness, or medications. The vaccine is given in 3 doses over 6 months.  Measles, mumps, rubella (MMR). You need at least 1 dose of MMR if you were born in 1957 or later. You may also need a 2nd dose.  Meningococcal. If you are age 56 to 38 years and a Orthoptist living in a residence hall, or have one of several medical conditions, you need to get vaccinated against meningococcal disease. You may also need additional booster doses.  Zoster (shingles). If you are age 82 years or older, you should get this vaccine.  Varicella (chickenpox). If you have never had chickenpox or you were vaccinated but received only 1 dose, talk to your caregiver to find out if you need this vaccine.  Hepatitis A. You need this vaccine if you have a specific risk factor for hepatitis A virus infection, or you simply wish to be protected from this disease. The vaccine is usually given as 2 doses, 6 to 18 months apart.  Hepatitis B. You need this vaccine if you have a specific risk factor for hepatitis B virus infection or you simply wish to be protected from this disease. The vaccine is given in 3 doses, usually over 6 months. Preventative Service / Frequency Ages 3 to 30  Blood pressure check.** / Every 1 to 2 years.  Lipid and cholesterol check.** /  Every 5 years beginning at age 42.  Hepatitis C blood test.** / For any individual with known risks for hepatitis C.  Skin self-exam. / Monthly.  Influenza immunization.** / Every year.  Pneumococcal polysaccharide immunization.** / 1 to 2 doses if you smoke cigarettes or if you have certain chronic medical conditions.  Tetanus, diphtheria, pertussis (Tdap,Td) immunization. / A one-time dose of Tdap vaccine. After that, you need a Td booster dose every 10 years.  HPV immunization. / 3 doses over 6 months, if 26 and younger.  Measles, mumps, rubella (MMR) immunization. / You need at least 1 dose of MMR if you were born in 1957 or later. You may also need a 2nd dose.  Meningococcal immunization. / 1 dose if you are age 72 to 68 years and a Orthoptist living in a residence hall, or have one of several medical conditions, you need to get vaccinated against meningococcal disease. You may also need additional booster doses.  Varicella immunization.** / Consult your caregiver.  Hepatitis A immunization.** / Consult your caregiver. 2 doses, 6 to 18 months apart.  Hepatitis B immunization.** / Consult your caregiver. 3 doses usually over 6 months. Ages 77 to 47  Blood pressure check.** / Every 1 to 2 years.  Lipid and cholesterol check.** / Every 5 years beginning at age 7.  Fecal occult blood test (FOBT) of stool. / Every year beginning at age 11 and continuing until age 61. You may not have to do this test if you get colonoscopy every 10 years.  Flexible sigmoidoscopy** or colonoscopy.** / Every 5 years for a flexible sigmoidoscopy or every 10 years for a colonoscopy beginning at age 51 and continuing until age 58.  Hepatitis C blood test.** / For all people born from [birth year] through 1965 and any individual with known risks for hepatitis C.  Skin self-exam. / Monthly.  Influenza immunization.** / Every year.  Pneumococcal polysaccharide immunization.** / 1 to 2 doses if  you smoke cigarettes or if you have certain chronic medical conditions.  Tetanus, diphtheria, pertussis (Tdap/Td) immunization.** /  A one-time dose of Tdap vaccine. After that, you need a Td booster dose every 10 years.  Measles, mumps, rubella (MMR) immunization. / You need at least 1 dose of MMR if you were born in 1957 or later. You may also need a 2nd dose.  Varicella immunization.**/ Consult your caregiver.  Meningococcal immunization.** / Consult your caregiver.  Hepatitis A immunization.** / Consult your caregiver. 2 doses, 6 to 18 months apart.  Hepatitis B immunization.** / Consult your caregiver. 3 doses, usually over 6 months. Ages 4 and over  Blood pressure check.** / Every 1 to 2 years.  Lipid and cholesterol check.**/ Every 5 years beginning at age 55.  Fecal occult blood test (FOBT) of stool. / Every year beginning at age 17 and continuing until age 26. You may not have to do this test if you get colonoscopy every 10 years.  Flexible sigmoidoscopy** or colonoscopy.** / Every 5 years for a flexible sigmoidoscopy or every 10 years for a colonoscopy beginning at age 67 and continuing until age 13.  Hepatitis C blood test.** / For all people born from [birth year] through 1965 and any individual with known risks for hepatitis C.  Abdominal aortic aneurysm (AAA) screening.** / A one-time screening for ages 12 to 35 years who are current or former smokers.  Skin self-exam. / Monthly.  Influenza immunization.** / Every year.  Pneumococcal polysaccharide immunization.** / 1 dose at age 61 (or older) if you have never been vaccinated.  Tetanus, diphtheria, pertussis (Tdap, Td) immunization. / A one-time dose of Tdap vaccine if you are over 65 and have contact with an infant, are a Research scientist (physical sciences), or simply want to be protected from whooping cough. After that, you need a Td booster dose every 10 years.  Varicella immunization. ** / Consult your caregiver.  Meningococcal  immunization.** / Consult your caregiver.  Hepatitis A immunization. ** / Consult your caregiver. 2 doses, 6 to 18 months apart.  Hepatitis B immunization.** / Check with your caregiver. 3 doses, usually over 6 months. **Family history and personal history of risk and conditions may change your caregiver's recommendations. Document Released: 10/28/2001 Document Revised: 11/24/2011 Document Reviewed: 01/27/2011 [facility] Patient Information 2013 [place], [place].     Neta Mends. Suresh Audi M.D.

## 2012-09-06 NOTE — Patient Instructions (Addendum)
Continue lifestyle intervention healthy eating and exercise . Continue b12 supplements  Monitor gout sx and usage of meds.  Plan labs to include B12  Folic acid cbcdiff vit D uric acid and hg a1c in about 3 months and then ROV to review or as needed.  No CI to surgery .   Heme as per Dr Cyndie Chime. prevnar 13 and flu vaccine today.    Preventive Care for Adults, Male A healthy lifestyle and preventive care can promote health and wellness. Preventive health guidelines for men include the following key practices:  A routine yearly physical is a good way to check with your caregiver about your health and preventative screening. It is a chance to share any concerns and updates on your health, and to receive a thorough exam.  Visit your dentist for a routine exam and preventative care every 6 months. Brush your teeth twice a day and floss once a day. Good oral hygiene prevents tooth decay and gum disease.  The frequency of eye exams is based on your age, health, family medical history, use of contact lenses, and other factors. Follow your caregiver's recommendations for frequency of eye exams.  Eat a healthy diet. Foods like vegetables, fruits, whole grains, low-fat dairy products, and lean protein foods contain the nutrients you need without too many calories. Decrease your intake of foods high in solid fats, added sugars, and salt. Eat the right amount of calories for you.Get information about a proper diet from your caregiver, if necessary.  Regular physical exercise is one of the most important things you can do for your health. Most adults should get at least 150 minutes of moderate-intensity exercise (any activity that increases your heart rate and causes you to sweat) each week. In addition, most adults need muscle-strengthening exercises on 2 or more days a week.  Maintain a healthy weight. The body mass index (BMI) is a screening tool to identify possible weight problems. It provides an  estimate of body fat based on height and weight. Your caregiver can help determine your BMI, and can help you achieve or maintain a healthy weight.For adults 20 years and older:  A BMI below 18.5 is considered underweight.  A BMI of 18.5 to 24.9 is normal.  A BMI of 25 to 29.9 is considered overweight.  A BMI of 30 and above is considered obese.  Maintain normal blood lipids and cholesterol levels by exercising and minimizing your intake of saturated fat. Eat a balanced diet with plenty of fruit and vegetables. Blood tests for lipids and cholesterol should begin at age 68 and be repeated every 5 years. If your lipid or cholesterol levels are high, you are over 50, or you are a high risk for heart disease, you may need your cholesterol levels checked more frequently.Ongoing high lipid and cholesterol levels should be treated with medicines if diet and exercise are not effective.  If you smoke, find out from your caregiver how to quit. If you do not use tobacco, do not start.  If you choose to drink alcohol, do not exceed 2 drinks per day. One drink is considered to be 12 ounces (355 mL) of beer, 5 ounces (148 mL) of wine, or 1.5 ounces (44 mL) of liquor.  Avoid use of street drugs. Do not share needles with anyone. Ask for help if you need support or instructions about stopping the use of drugs.  High blood pressure causes heart disease and increases the risk of stroke. Your blood pressure should  be checked at least every 1 to 2 years. Ongoing high blood pressure should be treated with medicines, if weight loss and exercise are not effective.  If you are 45 to 50 years old, ask your caregiver if you should take aspirin to prevent heart disease.  Diabetes screening involves taking a blood sample to check your fasting blood sugar level. This should be done once every 3 years, after age 43, if you are within normal weight and without risk factors for diabetes. Testing should be considered at a  younger age or be carried out more frequently if you are overweight and have at least 1 risk factor for diabetes.  Colorectal cancer can be detected and often prevented. Most routine colorectal cancer screening begins at the age of 31 and continues through age 2. However, your caregiver may recommend screening at an earlier age if you have risk factors for colon cancer. On a yearly basis, your caregiver may provide home test kits to check for hidden blood in the stool. Use of a small camera at the end of a tube, to directly examine the colon (sigmoidoscopy or colonoscopy), can detect the earliest forms of colorectal cancer. Talk to your caregiver about this at age 48, when routine screening begins. Direct examination of the colon should be repeated every 5 to 10 years through age 59, unless early forms of pre-cancerous polyps or small growths are found.  Hepatitis C blood testing is recommended for all people born from [birth year] through 1965 and any individual with known risks for hepatitis C.  Practice safe sex. Use condoms and avoid high-risk sexual practices to reduce the spread of sexually transmitted infections (STIs). STIs include gonorrhea, chlamydia, syphilis, trichomonas, herpes, HPV, and human immunodeficiency virus (HIV). Herpes, HIV, and HPV are viral illnesses that have no cure. They can result in disability, cancer, and death.  A one-time screening for abdominal aortic aneurysm (AAA) and surgical repair of large AAAs by sound wave imaging (ultrasonography) is recommended for ages 46 to 58 years who are current or former smokers.  Healthy men should no longer receive prostate-specific antigen (PSA) blood tests as part of routine cancer screening. Consult with your caregiver about prostate cancer screening.  Testicular cancer screening is not recommended for adult males who have no symptoms. Screening includes self-exam, caregiver exam, and other screening tests. Consult with your caregiver  about any symptoms you have or any concerns you have about testicular cancer.  Use sunscreen with skin protection factor (SPF) of 30 or more. Apply sunscreen liberally and repeatedly throughout the day. You should seek shade when your shadow is shorter than you. Protect yourself by wearing long sleeves, pants, a wide-brimmed hat, and sunglasses year round, whenever you are outdoors.  Once a month, do a whole body skin exam, using a mirror to look at the skin on your back. Notify your caregiver of new moles, moles that have irregular borders, moles that are larger than a pencil eraser, or moles that have changed in shape or color.  Stay current with required immunizations.  Influenza. You need a dose every fall (or winter). The composition of the flu vaccine changes each year, so being vaccinated once is not enough.  Pneumococcal polysaccharide. You need 1 to 2 doses if you smoke cigarettes or if you have certain chronic medical conditions. You need 1 dose at age 40 (or older) if you have never been vaccinated.  Tetanus, diphtheria, pertussis (Tdap, Td). Get 1 dose of Tdap vaccine if you are  younger than age 36 years, are over 67 and have contact with an infant, are a Research scientist (physical sciences), or simply want to be protected from whooping cough. After that, you need a Td booster dose every 10 years. Consult your caregiver if you have not had at least 3 tetanus and diphtheria-containing shots sometime in your life or have a deep or dirty wound.  HPV. This vaccine is recommended for males 13 through 50 years of age. This vaccine may be given to men 22 through 50 years of age who have not completed the 3 dose series. It is recommended for men through age 64 who have sex with men or whose immune system is weakened because of HIV infection, other illness, or medications. The vaccine is given in 3 doses over 6 months.  Measles, mumps, rubella (MMR). You need at least 1 dose of MMR if you were born in 1957 or later.  You may also need a 2nd dose.  Meningococcal. If you are age 37 to 28 years and a Orthoptist living in a residence hall, or have one of several medical conditions, you need to get vaccinated against meningococcal disease. You may also need additional booster doses.  Zoster (shingles). If you are age 19 years or older, you should get this vaccine.  Varicella (chickenpox). If you have never had chickenpox or you were vaccinated but received only 1 dose, talk to your caregiver to find out if you need this vaccine.  Hepatitis A. You need this vaccine if you have a specific risk factor for hepatitis A virus infection, or you simply wish to be protected from this disease. The vaccine is usually given as 2 doses, 6 to 18 months apart.  Hepatitis B. You need this vaccine if you have a specific risk factor for hepatitis B virus infection or you simply wish to be protected from this disease. The vaccine is given in 3 doses, usually over 6 months. Preventative Service / Frequency Ages 84 to 74  Blood pressure check.** / Every 1 to 2 years.  Lipid and cholesterol check.** / Every 5 years beginning at age 50.  Hepatitis C blood test.** / For any individual with known risks for hepatitis C.  Skin self-exam. / Monthly.  Influenza immunization.** / Every year.  Pneumococcal polysaccharide immunization.** / 1 to 2 doses if you smoke cigarettes or if you have certain chronic medical conditions.  Tetanus, diphtheria, pertussis (Tdap,Td) immunization. / A one-time dose of Tdap vaccine. After that, you need a Td booster dose every 10 years.  HPV immunization. / 3 doses over 6 months, if 26 and younger.  Measles, mumps, rubella (MMR) immunization. / You need at least 1 dose of MMR if you were born in 1957 or later. You may also need a 2nd dose.  Meningococcal immunization. / 1 dose if you are age 47 to 61 years and a Orthoptist living in a residence hall, or have one of  several medical conditions, you need to get vaccinated against meningococcal disease. You may also need additional booster doses.  Varicella immunization.** / Consult your caregiver.  Hepatitis A immunization.** / Consult your caregiver. 2 doses, 6 to 18 months apart.  Hepatitis B immunization.** / Consult your caregiver. 3 doses usually over 6 months. Ages 75 to 10  Blood pressure check.** / Every 1 to 2 years.  Lipid and cholesterol check.** / Every 5 years beginning at age 73.  Fecal occult blood test (FOBT) of stool. / Every  year beginning at age 69 and continuing until age 38. You may not have to do this test if you get colonoscopy every 10 years.  Flexible sigmoidoscopy** or colonoscopy.** / Every 5 years for a flexible sigmoidoscopy or every 10 years for a colonoscopy beginning at age 58 and continuing until age 69.  Hepatitis C blood test.** / For all people born from [birth year] through 1965 and any individual with known risks for hepatitis C.  Skin self-exam. / Monthly.  Influenza immunization.** / Every year.  Pneumococcal polysaccharide immunization.** / 1 to 2 doses if you smoke cigarettes or if you have certain chronic medical conditions.  Tetanus, diphtheria, pertussis (Tdap/Td) immunization.** / A one-time dose of Tdap vaccine. After that, you need a Td booster dose every 10 years.  Measles, mumps, rubella (MMR) immunization. / You need at least 1 dose of MMR if you were born in 1957 or later. You may also need a 2nd dose.  Varicella immunization.**/ Consult your caregiver.  Meningococcal immunization.** / Consult your caregiver.  Hepatitis A immunization.** / Consult your caregiver. 2 doses, 6 to 18 months apart.  Hepatitis B immunization.** / Consult your caregiver. 3 doses, usually over 6 months. Ages 54 and over  Blood pressure check.** / Every 1 to 2 years.  Lipid and cholesterol check.**/ Every 5 years beginning at age 4.  Fecal occult blood test (FOBT) of  stool. / Every year beginning at age 61 and continuing until age 35. You may not have to do this test if you get colonoscopy every 10 years.  Flexible sigmoidoscopy** or colonoscopy.** / Every 5 years for a flexible sigmoidoscopy or every 10 years for a colonoscopy beginning at age 74 and continuing until age 79.  Hepatitis C blood test.** / For all people born from [birth year] through 1964 and any individual with known risks for hepatitis C.  Abdominal aortic aneurysm (AAA) screening.** / A one-time screening for ages 60 to 43 years who are current or former smokers.  Skin self-exam. / Monthly.  Influenza immunization.** / Every year.  Pneumococcal polysaccharide immunization.** / 1 dose at age 1 (or older) if you have never been vaccinated.  Tetanus, diphtheria, pertussis (Tdap, Td) immunization. / A one-time dose of Tdap vaccine if you are over 65 and have contact with an infant, are a Research scientist (physical sciences), or simply want to be protected from whooping cough. After that, you need a Td booster dose every 10 years.  Varicella immunization. ** / Consult your caregiver.  Meningococcal immunization.** / Consult your caregiver.  Hepatitis A immunization. ** / Consult your caregiver. 2 doses, 6 to 18 months apart.  Hepatitis B immunization.** / Check with your caregiver. 3 doses, usually over 6 months. **Family history and personal history of risk and conditions may change your caregiver's recommendations. Document Released: 10/28/2001 Document Revised: 11/24/2011 Document Reviewed: 01/27/2011 [facility] Patient Information 2013 [place], [place].

## 2012-09-15 HISTORY — PX: SHOULDER ARTHROSCOPY: SHX128

## 2012-09-27 ENCOUNTER — Ambulatory Visit (HOSPITAL_BASED_OUTPATIENT_CLINIC_OR_DEPARTMENT_OTHER): Payer: Federal, State, Local not specified - PPO | Admitting: Oncology

## 2012-09-27 ENCOUNTER — Telehealth: Payer: Self-pay | Admitting: Oncology

## 2012-09-27 ENCOUNTER — Other Ambulatory Visit (HOSPITAL_BASED_OUTPATIENT_CLINIC_OR_DEPARTMENT_OTHER): Payer: Federal, State, Local not specified - PPO

## 2012-09-27 VITALS — BP 145/92 | HR 83 | Temp 98.6°F | Resp 18 | Ht 69.75 in | Wt 200.0 lb

## 2012-09-27 DIAGNOSIS — R238 Other skin changes: Secondary | ICD-10-CM

## 2012-09-27 DIAGNOSIS — R233 Spontaneous ecchymoses: Secondary | ICD-10-CM

## 2012-09-27 DIAGNOSIS — D696 Thrombocytopenia, unspecified: Secondary | ICD-10-CM

## 2012-09-27 LAB — CBC WITH DIFFERENTIAL/PLATELET
BASO%: 0.3 % (ref 0.0–2.0)
LYMPH%: 34.9 % (ref 14.0–49.0)
MCHC: 34.3 g/dL (ref 32.0–36.0)
MCV: 95.8 fL (ref 79.3–98.0)
MONO#: 0.5 10*3/uL (ref 0.1–0.9)
MONO%: 7.4 % (ref 0.0–14.0)
Platelets: 227 10*3/uL (ref 140–400)
RBC: 4.04 10*6/uL — ABNORMAL LOW (ref 4.20–5.82)
RDW: 12.9 % (ref 11.0–14.6)
WBC: 6.5 10*3/uL (ref 4.0–10.3)

## 2012-09-27 LAB — APTT: aPTT: 31 seconds (ref 24–37)

## 2012-09-27 NOTE — Telephone Encounter (Signed)
Talked to patient gave him appt for 2/10/ 14 MD only

## 2012-09-28 NOTE — Progress Notes (Signed)
Hematology and Oncology Follow Up Visit  Hector [name] [id number] 1960/12/23 52 y.o. 09/28/2012 8:02 AM   Principle Diagnosis: Easy bruising   Interim History:  Followup visit for this 52 year old Caucasian man referred to this office back in August 2013 for further evaluation of transient easy bruising. He had a normal physical exam. No bruises noted at the time of his visit here on August 28. He had a history of multiple prior major surgeries and had never had any excessive bleeding following these procedures. At the time of that visit, he was not aware of any family history of excessive bleeding. Baseline platelet count, protime and PTT were all normal. I was therefore somewhat surprised when a von Willebrand profile showed a decreased factor VIII level of 45% with normal von Willebrand antigen and normal ristocetin cofactor activity. I thought perhaps he had the rare 2N, von Willebrand's [place], variation of von Willebrand's disorder where factor VIII is typically decreased but von Willebrand protein is normal. A repeat blood sample was sent to the [facility]. A von Willebrand to factor VIII binding ratio was calculated and was perfectly normal at 1.0 (reference range 0.73-1.42). It still seems likely that this is a mild von Willebrand's disorder given his clinical history and family history as opposed to a mild hemophilia A. disorder. He now tells me he has done some additional family questioning and that  a maternal uncle may have had hemophilia or a similar blood disorder. Unfortunately that person died of complications of alcoholic cirrhosis at the age of 58. The best he can tell me about other family members is that his mother also bruises easily. Both of his parents are still alive. He has a healthy son and a healthy daughter ages 34 and 22 with no bleeding issues.  He just had a arthroscopic shoulder surgery 2 weeks ago by Dr. Thomasena Edis and had no problems.  Medications:  reviewed  Allergies:  Allergies  Allergen Reactions  . Aspirin     REACTION: Pt had a allergic reaction as a perforated ulcer  . Hydrocodone Other (See Comments)    headache     Physical Exam: Blood pressure 145/92, pulse 83, temperature 98.6 F (37 C), temperature source Oral, resp. rate 18, height 5' 9.75" (1.772 m), weight 200 lb (90.719 kg). Wt Readings from Last 3 Encounters:  09/27/12 200 lb (90.719 kg)  09/06/12 199 lb (90.266 kg)  05/12/12 205 lb 4.8 oz (93.123 kg)       Lab Results: Lab Results  Component Value Date   WBC 6.5 09/27/2012   HGB 13.3 09/27/2012   HCT 38.7 09/27/2012   MCV 95.8 09/27/2012   PLT 227 09/27/2012     Chemistry      Component Value Date/Time   NA 141 08/30/2012 1711   K 4.5 08/30/2012 1711   CL 100 08/30/2012 1711   CO2 32 08/30/2012 1711   BUN 11 08/30/2012 1711   CREATININE 1.1 08/30/2012 1711      Component Value Date/Time   CALCIUM 9.6 08/30/2012 1711   ALKPHOS 66 08/30/2012 1711   AST 22 08/30/2012 1711   ALT 15 08/30/2012 1711   BILITOT 0.7 08/30/2012 1711      Impression and Plan: 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  I'm going to repeat a von Willebrand's profile again today which also includes a factor VIII activity.  After consulting with an expert colleague at a national meeting last month, I will also check a factor  V activity since apparently there is an association between decreased factor V activity and mild hemophilia A.  factor VIII gene.  Based on these results, I will make a decision on whether the patient's mother should be tested. I think that this would be most informative. Ultimately we may also need to check his children.  The only definitive way to separate mild von Willebrand's from mild factor VIII deficiency/hemophilia A. would be to sequence the factor VIII gene. Although this can be done, it would be expensive.   CC:. Dr. Berniece Andreas   Levert Feinstein, MD 1/14/20148:02  AM

## 2012-10-19 ENCOUNTER — Encounter (HOSPITAL_COMMUNITY): Payer: Self-pay | Admitting: *Deleted

## 2012-10-19 ENCOUNTER — Observation Stay (HOSPITAL_COMMUNITY)
Admission: EM | Admit: 2012-10-19 | Discharge: 2012-10-20 | Disposition: A | Discharge status: Home / Self Care | Payer: Federal, State, Local not specified - PPO | Attending: Internal Medicine | Admitting: Internal Medicine

## 2012-10-19 ENCOUNTER — Emergency Department (HOSPITAL_COMMUNITY): Payer: Federal, State, Local not specified - PPO

## 2012-10-19 DIAGNOSIS — G5600 Carpal tunnel syndrome, unspecified upper limb: Secondary | ICD-10-CM

## 2012-10-19 DIAGNOSIS — D6800 Von Willebrand disease, unspecified: Secondary | ICD-10-CM | POA: Insufficient documentation

## 2012-10-19 DIAGNOSIS — E538 Deficiency of other specified B group vitamins: Secondary | ICD-10-CM

## 2012-10-19 DIAGNOSIS — E119 Type 2 diabetes mellitus without complications: Secondary | ICD-10-CM | POA: Insufficient documentation

## 2012-10-19 DIAGNOSIS — D696 Thrombocytopenia, unspecified: Secondary | ICD-10-CM

## 2012-10-19 DIAGNOSIS — T50995A Adverse effect of other drugs, medicaments and biological substances, initial encounter: Secondary | ICD-10-CM

## 2012-10-19 DIAGNOSIS — Z9884 Bariatric surgery status: Secondary | ICD-10-CM | POA: Insufficient documentation

## 2012-10-19 DIAGNOSIS — G4700 Insomnia, unspecified: Secondary | ICD-10-CM

## 2012-10-19 DIAGNOSIS — R079 Chest pain, unspecified: Principal | ICD-10-CM | POA: Insufficient documentation

## 2012-10-19 DIAGNOSIS — G4733 Obstructive sleep apnea (adult) (pediatric): Secondary | ICD-10-CM

## 2012-10-19 DIAGNOSIS — G44309 Post-traumatic headache, unspecified, not intractable: Secondary | ICD-10-CM

## 2012-10-19 DIAGNOSIS — R238 Other skin changes: Secondary | ICD-10-CM

## 2012-10-19 DIAGNOSIS — IMO0002 Reserved for concepts with insufficient information to code with codable children: Secondary | ICD-10-CM

## 2012-10-19 DIAGNOSIS — J45909 Unspecified asthma, uncomplicated: Secondary | ICD-10-CM

## 2012-10-19 DIAGNOSIS — Z8739 Personal history of other diseases of the musculoskeletal system and connective tissue: Secondary | ICD-10-CM

## 2012-10-19 DIAGNOSIS — R233 Spontaneous ecchymoses: Secondary | ICD-10-CM

## 2012-10-19 DIAGNOSIS — M549 Dorsalgia, unspecified: Secondary | ICD-10-CM

## 2012-10-19 DIAGNOSIS — S0990XS Unspecified injury of head, sequela: Secondary | ICD-10-CM

## 2012-10-19 DIAGNOSIS — M109 Gout, unspecified: Secondary | ICD-10-CM

## 2012-10-19 DIAGNOSIS — G4730 Sleep apnea, unspecified: Secondary | ICD-10-CM

## 2012-10-19 DIAGNOSIS — Z0000 Encounter for general adult medical examination without abnormal findings: Secondary | ICD-10-CM

## 2012-10-19 DIAGNOSIS — E785 Hyperlipidemia, unspecified: Secondary | ICD-10-CM

## 2012-10-19 DIAGNOSIS — E669 Obesity, unspecified: Secondary | ICD-10-CM

## 2012-10-19 DIAGNOSIS — D66 Hereditary factor VIII deficiency: Secondary | ICD-10-CM | POA: Insufficient documentation

## 2012-10-19 DIAGNOSIS — F4323 Adjustment disorder with mixed anxiety and depressed mood: Secondary | ICD-10-CM

## 2012-10-19 LAB — COMPREHENSIVE METABOLIC PANEL
ALT: 14 U/L (ref 0–53)
AST: 24 U/L (ref 0–37)
Albumin: 4.2 g/dL (ref 3.5–5.2)
Calcium: 9.3 mg/dL (ref 8.4–10.5)
GFR calc Af Amer: 89 mL/min — ABNORMAL LOW (ref >90)
Potassium: 4 mEq/L (ref 3.5–5.1)
Sodium: 141 mEq/L (ref 135–145)
Total Protein: 6.6 g/dL (ref 6.0–8.3)

## 2012-10-19 LAB — CBC
HCT: 40 % (ref 39.0–52.0)
Hemoglobin: 13.9 g/dL (ref 13.0–17.0)
MCHC: 34.8 g/dL (ref 30.0–36.0)
MCV: 95.7 fL (ref 78.0–100.0)

## 2012-10-19 LAB — PROTIME-INR: Prothrombin Time: 12.8 seconds (ref 11.6–15.2)

## 2012-10-19 MED ORDER — ONDANSETRON HCL 4 MG PO TABS: 4.0000 mg | ORAL_TABLET | Freq: Four times a day (QID) | ORAL | Status: DC | PRN

## 2012-10-19 MED ORDER — ALBUTEROL 90 MCG/ACT IN AERS: 2.0000 | INHALATION_SPRAY | Freq: Four times a day (QID) | RESPIRATORY_TRACT | Status: DC | PRN

## 2012-10-19 MED ORDER — PANTOPRAZOLE SODIUM 40 MG PO TBEC
40.0000 mg | DELAYED_RELEASE_TABLET | Freq: Every day | ORAL | Status: DC
  Administered 2012-10-20: 40 mg via ORAL
  Filled 2012-10-19 (×2): qty 1

## 2012-10-19 MED ORDER — SODIUM CHLORIDE 0.9 % IJ SOLN
3.0000 mL | Freq: Two times a day (BID) | INTRAMUSCULAR | Status: DC
  Administered 2012-10-19 – 2012-10-20 (×2): 3 mL via INTRAVENOUS

## 2012-10-19 MED ORDER — POLYETHYLENE GLYCOL 3350 17 G PO PACK
17.0000 g | PACK | Freq: Every day | ORAL | Status: DC | PRN
  Filled 2012-10-19: qty 1

## 2012-10-19 MED ORDER — HYDROMORPHONE HCL PF 1 MG/ML IJ SOLN
1.0000 mg | Freq: Once | INTRAMUSCULAR | Status: AC
  Administered 2012-10-19: 1 mg via INTRAVENOUS
  Filled 2012-10-19: qty 1

## 2012-10-19 MED ORDER — FERROUS SULFATE 325 (65 FE) MG PO TABS
325.0000 mg | ORAL_TABLET | Freq: Every day | ORAL | Status: DC
  Administered 2012-10-20: 325 mg via ORAL
  Filled 2012-10-19 (×2): qty 1

## 2012-10-19 MED ORDER — ALUM & MAG HYDROXIDE-SIMETH 200-200-20 MG/5ML PO SUSP: 30.0000 mL | Freq: Four times a day (QID) | ORAL | Status: DC | PRN

## 2012-10-19 MED ORDER — AMITRIPTYLINE HCL 50 MG PO TABS
50.0000 mg | ORAL_TABLET | Freq: Every evening | ORAL | Status: DC | PRN
  Filled 2012-10-19: qty 1

## 2012-10-19 MED ORDER — B COMPLEX-C PO TABS
1.0000 | ORAL_TABLET | Freq: Every day | ORAL | Status: DC
  Administered 2012-10-20: 1 via ORAL
  Filled 2012-10-19 (×2): qty 1

## 2012-10-19 MED ORDER — GI COCKTAIL ~~LOC~~
30.0000 mL | Freq: Once | ORAL | Status: AC
  Administered 2012-10-19: 30 mL via ORAL
  Filled 2012-10-19: qty 30

## 2012-10-19 MED ORDER — HYDROMORPHONE HCL PF 1 MG/ML IJ SOLN: 1.0000 mg | INTRAMUSCULAR | Status: DC | PRN

## 2012-10-19 MED ORDER — PANTOPRAZOLE SODIUM 40 MG PO TBEC
40.0000 mg | DELAYED_RELEASE_TABLET | Freq: Once | ORAL | Status: AC
  Administered 2012-10-19: 40 mg via ORAL

## 2012-10-19 MED ORDER — ALBUTEROL SULFATE HFA 108 (90 BASE) MCG/ACT IN AERS
2.0000 | INHALATION_SPRAY | Freq: Four times a day (QID) | RESPIRATORY_TRACT | Status: DC | PRN
  Filled 2012-10-19: qty 6.7

## 2012-10-19 MED ORDER — SODIUM CHLORIDE 0.9 % IV SOLN
Freq: Once | INTRAVENOUS | Status: AC
  Administered 2012-10-19: 10:00:00 via INTRAVENOUS

## 2012-10-19 MED ORDER — ZOLPIDEM TARTRATE 5 MG PO TABS
10.0000 mg | ORAL_TABLET | Freq: Once | ORAL | Status: AC
  Administered 2012-10-19: 10 mg via ORAL
  Filled 2012-10-19: qty 2

## 2012-10-19 MED ORDER — ENOXAPARIN SODIUM 40 MG/0.4ML ~~LOC~~ SOLN
40.0000 mg | SUBCUTANEOUS | Status: DC
  Administered 2012-10-19: 40 mg via SUBCUTANEOUS
  Filled 2012-10-19 (×2): qty 0.4

## 2012-10-19 MED ORDER — ONDANSETRON HCL 4 MG/2ML IJ SOLN: 4.0000 mg | Freq: Four times a day (QID) | INTRAMUSCULAR | Status: DC | PRN

## 2012-10-19 MED ORDER — IBUPROFEN 600 MG PO TABS
600.0000 mg | ORAL_TABLET | Freq: Three times a day (TID) | ORAL | Status: DC
  Administered 2012-10-20: 600 mg via ORAL
  Filled 2012-10-19 (×4): qty 1

## 2012-10-19 MED ORDER — ESCITALOPRAM OXALATE 20 MG PO TABS
20.0000 mg | ORAL_TABLET | Freq: Every day | ORAL | Status: DC
  Administered 2012-10-20: 20 mg via ORAL
  Filled 2012-10-19: qty 1

## 2012-10-19 MED ORDER — LAMOTRIGINE 150 MG PO TABS
150.0000 mg | ORAL_TABLET | Freq: Every day | ORAL | Status: DC
  Administered 2012-10-20: 150 mg via ORAL
  Filled 2012-10-19: qty 1

## 2012-10-19 NOTE — ED Provider Notes (Signed)
History     CSN: [id number]  Arrival date & time 10/19/12  0820   First MD Initiated Contact with Patient 10/19/12 0830      Chief Complaint  Patient presents with  . Chest Pain    HPI  The patient presents with chest pain.  Pain began approximately 2 days ago, without clear precipitant.  Since onset the pain has been constant, with slight waxing/waning severity.  Pain does not improve with Tylenol.  Pain is not clearly exacerbated by anything.  The pain is worse with exertion.  The patient denies dyspnea or pleuritic pain. No fever, no chills, no cough, no nausea, vomiting. The patient has a notable history of prior gastric bypass, prior perforated gastric ulcer. He also has a notable history of right shoulder arthroscopic repair approximately one month ago. For this he has taken Tylenol. No recent cardiac evaluation. He does not smoke, does not drink.  Past Medical History  Diagnosis Date  . Diabetes mellitus   . Hyperlipidemia   . Abnormal LFTs     improved after gastic bypass  . History of concussion   . Gout   . Sleep apnea   . Perforated ulcer     Surgical repair9/24 2011  . B12 nutritional deficiency   . Closed head injury     laceration  9 12  no loc  vision change  . History of renal stone     Seen in emergency room not urologist    Past Surgical History  Procedure Date  . Vasectomy   . Hernia repair     umbilical   . Nasal septoplasty w/ turbinoplasty     x 2  . Gastric bypass 11/06/09    beginning weight 267  . Epidural block injection     by Dr. Ethelene Hal  . Surgerical repair of stomach ulcer 06/08/10    per  . Back surgery     Dr. Shelle Iron    Family History  Problem Relation Age of Onset  . Uterine cancer Mother   . Hypertension Mother   . Cervical cancer Mother   . Heart disease Father   . COPD Father     History  Substance Use Topics  . Smoking status: Never Smoker   . Smokeless tobacco: Not on file  . Alcohol Use: No     Comment: socially       Review of Systems  Constitutional:       Per HPI, otherwise negative  HENT:       Per HPI, otherwise negative  Eyes: Negative.   Respiratory:       Per HPI, otherwise negative  Cardiovascular:       Per HPI, otherwise negative  Gastrointestinal: Negative for vomiting.  Genitourinary: Negative.   Musculoskeletal:       Per HPI, otherwise negative  Skin: Negative.   Neurological: Negative for syncope.    Allergies  Aspirin and Hydrocodone  Home Medications   Current Outpatient Rx  Name  Route  Sig  Dispense  Refill  . ALBUTEROL 90 MCG/ACT IN AERS   Inhalation   Inhale 2 puffs into the lungs every 6 (six) hours as needed. For shortness of breath.         . AMITRIPTYLINE HCL 50 MG PO TABS   Oral   Take 1 tablet (50 mg total) by mouth at bedtime as needed for sleep. TAKE 1 TABLET (50 MG TOTAL) BY MOUTH AT BEDTIME AS NEEDED FOR SLEEP.  90 tablet   3   . B COMPLEX-C PO TABS   Oral   Take 1 tablet by mouth daily.         [name] CALCIUM CITRATE 950 MG PO TABS   Oral   Take 1 tablet by mouth daily.          [name] VITAMIN D 1000 UNITS PO CAPS   Oral   Take 1 capsule (1,000 Units total) by mouth daily.   30 capsule   11   . ENDOCET 5-325 MG PO TABS   Oral   Take 1 tablet by mouth every 8 (eight) hours as needed. For pain         . ESCITALOPRAM OXALATE 20 MG PO TABS   Oral   Take 20 mg by mouth daily.         [name] FERROUS SULFATE 325 (65 FE) MG PO TABS   Oral   Take 325 mg by mouth daily with breakfast.         . GLUCOSE BLOOD VI STRP      Use as instructed   100 each   12   . INDOMETHACIN 50 MG PO CAPS   Oral   Take 1 capsule (50 mg total) by mouth 3 (three) times daily with meals. TAKE ONE CAPSULE BY MOUTH 3 TIMES A DAYas needed for gout and headaches   40 capsule   1   . LAMOTRIGINE 150 MG PO TABS   Oral   Take 150 mg by mouth every morning.           [name] METHOCARBAMOL 750 MG PO TABS   Oral   Take 750 mg by mouth 4 (four) times daily as  needed. Back pain.         . ADULT ONE DAILY GUMMIES PO CHEW   Oral   Chew 1 tablet by mouth daily.          [name] OMEPRAZOLE 20 MG PO CPDR   Oral   Take 1 capsule (20 mg total) by mouth daily.   21 capsule   0   . ZOLPIDEM TARTRATE 10 MG PO TABS   Oral   Take 10 mg by mouth at bedtime as needed.            BP 155/95  Pulse 72  Temp 97.8 F (36.6 C) (Oral)  Resp 16  SpO2 100%  Physical Exam  Nursing note and vitals reviewed. Constitutional: He is oriented to person, place, and time. He appears well-developed. No distress.  HENT:  Head: Normocephalic and atraumatic.  Eyes: Conjunctivae normal and EOM are normal.  Cardiovascular: Normal rate and regular rhythm.   Pulmonary/Chest: Effort normal. No stridor. No respiratory distress.  Abdominal: He exhibits no distension. There is tenderness in the epigastric area. There is no rigidity, no rebound, no guarding and no CVA tenderness.  Musculoskeletal: He exhibits no edema.       Right, no effusion no crepitus  Neurological: He is alert and oriented to person, place, and time.  Skin: Skin is warm and dry.  Psychiatric: He has a normal mood and affect.    ED Course  Procedures (including critical care time)   Labs Reviewed  COMPREHENSIVE METABOLIC PANEL  CBC  TROPONIN I  PROTIME-INR  LIPASE, BLOOD   No results found.   No diagnosis found.  Pulse ox 99% room air normal Cardiac 60 sinus rhythm normal    Date: 10/19/2012  Rate: 66  Rhythm: normal  sinus rhythm  QRS Axis: normal  Intervals: normal  ST/T Wave abnormalities: normal  Conduction Disutrbances: none  Narrative Interpretation: unremarkable  Update: Patient significantly improved following analgesics.  We discussed all results.  The patient will be admitted for further evaluation and management.    MDM  This patient presents with 2 days of chest pain.  On exam patient is uncomfortable appearing, but in no distress.  Hemodynamically stable.  EKG  and initial labs are reassuring.  His symptoms maybe 2/2 GI etiology, but with his pain, the absence of recent evaluation, he'll be admitted for further evaluation and management. Patient has negligible risk factors for PE.     Gerhard Munch, MD 10/19/12 1025

## 2012-10-19 NOTE — Progress Notes (Signed)
Pt stated he had not had any relief with his chest pain since admission that got worse after he ate his lunch. Dr. Lafe Garin notified and came to see pt. Orders received. Will cont to monitor

## 2012-10-19 NOTE — ED Notes (Signed)
Pt states that 2 days ago he was sitting on the couch when he began having central chest pain. Pt states that today the pain is worse. Pt reports pain as constant and not worsened or relieved by anything. Pt denies any SOB but states that pain is alittle worse with inspiration denies any tenderness upon palpation. Pt reports congestion a few days prior.

## 2012-10-19 NOTE — H&P (Addendum)
[facility] History and Physical  Hector [name] ZOX:[id number] DOB: 03/16/1961 DOA: 10/19/2012  Referring physician: Dr. Jeraldine Loots PCP: Lorretta Harp, MD   Chief Complaint: chest pain  HPI: Hector [name] is a 52 y.o. 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 Cyndie Chime), present with a CC of chest pain.  His chest pain started 3-4 days ago, low grade initially however this morning it was a more severe and he decided to come to the ED. He denies having chest pain like this in the past. Chart review shows an ED visit 1 year ago with chest pain however patient denies having chest pain but recalls being here with kidney stones. His pain is mid-chest, substernal, non-irradiating, somewhat worse with inspiration. His pain is worse when he tries to exert himself and over the past 4 days he noticed that he is more shortwinded with activities. He has no fever/chills, denies NVD.  Has had a prior cardiac evaluation about 20 years ago, had at that time chest pain with a 2D echo and a stress test, reportedly negative at that time. Denies any weight gain or fluid in his legs. :ast week might have had an upper respiratory infection.   Review of Systems: as per HPI, otherwise negative.   Past Medical History  Diagnosis Date  . Diabetes mellitus   . Hyperlipidemia   . Abnormal LFTs     improved after gastic bypass  . History of concussion   . Gout   . Sleep apnea   . Perforated ulcer     Surgical repair9/24 2011  . B12 nutritional deficiency   . Closed head injury     laceration  9 12  no loc  vision change  . History of renal stone     Seen in emergency room not urologist   Past Surgical History  Procedure Date  . Vasectomy   . Hernia repair     umbilical   . Nasal septoplasty w/ turbinoplasty     x 2  . Gastric bypass 11/06/09    beginning  weight 267  . Epidural block injection     by Dr. Ethelene Hal  . Surgerical repair of stomach ulcer 06/08/10    per  . Back surgery     Dr. Shelle Iron   Social History:  reports that he has never smoked. He does not have any smokeless tobacco history on file. He reports that he does not drink alcohol or use illicit drugs.  Allergies  Allergen Reactions  . Aspirin     REACTION: Pt had a allergic reaction as a perforated ulcer  . Hydrocodone Other (See Comments)    headache    Family History  Problem Relation Age of Onset  . Uterine cancer Mother   . Hypertension Mother   . Cervical cancer Mother   . Heart disease Father   . COPD Father    Prior to Admission medications   Medication Sig Start Date End Date Taking? Authorizing Provider  albuterol (PROVENTIL,VENTOLIN) 90 MCG/ACT inhaler Inhale 2 puffs into the lungs every 6 (six) hours as needed. For shortness of breath.   Yes Madelin Headings, MD  amitriptyline (ELAVIL) 50 MG tablet Take 1 tablet (50 mg total) by mouth at bedtime as needed for sleep. TAKE 1 TABLET (50 MG TOTAL) BY MOUTH AT BEDTIME AS NEEDED FOR SLEEP. 09/06/12  Yes Neta Mends  Panosh, MD  B Complex-C (B-COMPLEX WITH VITAMIN C) tablet Take 1 tablet by mouth daily.   Yes Historical Provider, MD  calcium citrate (CALCITRATE) 950 MG tablet Take 1 tablet by mouth daily.    Yes Historical Provider, MD  Cholecalciferol (VITAMIN D) 1000 UNITS capsule Take 1 capsule (1,000 Units total) by mouth daily. 03/26/12 03/26/13 Yes Madelin Headings, MD  ENDOCET 5-325 MG per tablet Take 1 tablet by mouth every 8 (eight) hours as needed. For pain 06/07/11  Yes Historical Provider, MD  escitalopram (LEXAPRO) 20 MG tablet Take 20 mg by mouth daily.   Yes Historical Provider, MD  ferrous sulfate 325 (65 FE) MG tablet Take 325 mg by mouth daily with breakfast.   Yes Historical Provider, MD  indomethacin (INDOCIN) 50 MG capsule Take 1 capsule (50 mg total) by mouth 3 (three) times daily with meals. TAKE ONE  CAPSULE BY MOUTH 3 TIMES A DAYas needed for gout and headaches 09/06/12  Yes Madelin Headings, MD  lamoTRIgine (LAMICTAL) 150 MG tablet Take 150 mg by mouth every morning.     Yes Historical Provider, MD  methocarbamol (ROBAXIN) 750 MG tablet Take 750 mg by mouth 4 (four) times daily as needed. Back pain.   Yes Historical Provider, MD  Multiple Vitamins-Minerals (ADULT ONE DAILY GUMMIES) CHEW Chew 1 tablet by mouth daily.    Yes Historical Provider, MD  omeprazole (PRILOSEC) 20 MG capsule Take 20 mg by mouth daily.   Yes Renne Crigler, PA  zolpidem (AMBIEN) 10 MG tablet Take 10 mg by mouth at bedtime as needed. For sleep 12/23/11  Yes Historical Provider, MD  glucose blood (ONE TOUCH TEST STRIPS) test strip Use as instructed 03/24/12 03/24/13  Madelin Headings, MD   Physical Exam: Filed Vitals:   10/19/12 0827 10/19/12 0830 10/19/12 1133  BP: 155/95  166/96  Pulse: 72  59  Temp:  97.8 F (36.6 C) 98.2 F (36.8 C)  TempSrc:  Oral   Resp: 16  18  Height:   5\' 10"  (1.778 m)  Weight:   86.183 kg (190 lb)  SpO2: 100%  100%     General:  NAD  Eyes: PERRL, EOMI  ENT: most oropharynx  Neck: supple, no JVD  Cardiovascular: RRR without MRG  Respiratory: CTA biL, no wheezing, no crackled  Abdomen: soft, NTTP  Skin: no rashes  Musculoskeletal: no edema  Psychiatric: normal mood and affect  Neurologic: non focal .  Labs on Admission:  Basic Metabolic Panel:  Lab 10/19/12 6578  NA 141  K 4.0  CL 105  CO2 27  GLUCOSE 105*  BUN 7  CREATININE 1.09  CALCIUM 9.3  MG --  PHOS --   Liver Function Tests:  Lab 10/19/12 0853  AST 24  ALT 14  ALKPHOS 65  BILITOT 0.3  PROT 6.6  ALBUMIN 4.2    Lab 10/19/12 0853  LIPASE 32  AMYLASE --   CBC:  Lab 10/19/12 0853  WBC 4.6  NEUTROABS --  HGB 13.9  HCT 40.0  MCV 95.7  PLT 139*   Cardiac Enzymes:  Lab 10/19/12 0857  CKTOTAL --  CKMB --  CKMBINDEX --  TROPONINI <0.30   Radiological Exams on Admission: Dg Chest 2  View  10/19/2012  *RADIOLOGY REPORT*  Clinical Data: Chest pressure.  CHEST - 2 VIEW  Comparison: PA and lateral chest 10/16/2011 and 01/20/2011.  Findings: Mild, patchy airspace opacity is seen in the left mid lung zone on the PA view  only.  The right lung is clear.  Heart size is normal.  No pneumothorax or pleural fluid.  IMPRESSION: Mild patchy airspace opacity in the left midlung zone on the PA view is likely due to atelectasis rather than infection.   Original Report Authenticated By: Holley Dexter, M.D.     EKG: Independently reviewed.   Assessment/Plan Active Problems:  THROMBOCYTOPENIA  Chest pain  1. Chest pain - for ~2 days. Initial troponin negative, will admit for obs since he continues to have pain in the ED. Troponins x 3. ? Pericarditis? Will obtain a 2D echo. If cardiac workup is negative this might be related to GI tract. Will also get a BNP. Lipase normal, LFTs normal. CXR with mild airspace opacity left mid-lung, likely atelectasis. Not febrile, tachycardic, no leukocytosis, no productive cough, very low suspicion for PNA.  2. Thrombocytopenia - plt 139, he has been Tcpenic in the past. Will monitor.    Code Status: Full Family Communication:  None  Disposition Plan: 1 day   Time spent: 70 min for chart review, patient exam and evaluation, treatment plan and family communication.   Pamella Pert [facility] Pager [phone]  If 7PM-7AM, please contact night-coverage [email or web address] Password [facility] 10/19/2012, 12:14 PM

## 2012-10-20 DIAGNOSIS — R079 Chest pain, unspecified: Secondary | ICD-10-CM

## 2012-10-20 DIAGNOSIS — R072 Precordial pain: Secondary | ICD-10-CM

## 2012-10-20 DIAGNOSIS — R0602 Shortness of breath: Secondary | ICD-10-CM

## 2012-10-20 MED ORDER — UNABLE TO FIND: Status: DC

## 2012-10-20 MED ORDER — PANTOPRAZOLE SODIUM 40 MG PO TBEC: 40.0000 mg | DELAYED_RELEASE_TABLET | Freq: Every day | ORAL | Status: DC

## 2012-10-20 NOTE — Progress Notes (Signed)
[facility] PROGRESS NOTE  Hector [name] ZOX:[id number] DOB: [birth year] DOA: 10/19/2012 PCP: Hector Harp, MD  Assessment/Plan: Chest pain Etiology unclear. Patient ruled out for ACS with negative cardiac enzymes. 2D Echo done with results pending. Stress test in 2003 negative. Requested cardiology consultation.  Thrombocytopenia Stable. Chronic. Has had workup done by Dr. Cyndie [name].  Diabetes? Hemoglobin A1C on in July of 2013 was 6.0, appears to be diet controlled.  Code Status: Full code. Family Communication: No family at bedside. Patient updated. Disposition Plan: Pending, cardiology evaluation.   Consultants:  Cardiology.  Procedures:  As above.  Antibiotics:  None.  HPI/Subjective: Had mild chest pain this morning. No other specific concerns.  Objective: Filed Vitals:   10/19/12 0830 10/19/12 1133 10/19/12 2300 10/20/12 0542  BP:  166/96 131/80 131/90  Pulse:  59 69 69  Temp: 97.8 F (36.6 C) 98.2 F (36.8 C) 97.8 F (36.6 C) 97.8 F (36.6 C)  TempSrc: Oral  Oral Oral  Resp:  18 18 18   Height:  5\' 10"  (1.778 m)    Weight:  86.183 kg (190 lb)  87.952 kg (193 lb 14.4 oz)  SpO2:  100% 97% 98%   No intake or output data in the 24 hours ending 10/20/12 1112 Filed Weights   10/19/12 1133 10/20/12 0542  Weight: 86.183 kg (190 lb) 87.952 kg (193 lb 14.4 oz)    Exam: Physical Exam: General: Awake, Oriented, No acute distress. HEENT: EOMI. Neck: Supple CV: S1 and S2 Lungs: Clear to ascultation bilaterally Abdomen: Soft, Nontender, Nondistended, +bowel sounds. Ext: Good pulses. Trace edema.  Data Reviewed: Basic Metabolic Panel:  Lab 10/19/12 8119  NA 141  K 4.0  CL 105  CO2 27  GLUCOSE 105*  BUN 7  CREATININE 1.09  CALCIUM 9.3  MG --  PHOS --   Liver Function Tests:  Lab 10/19/12 0853  AST 24  ALT 14  ALKPHOS 65  BILITOT 0.3  PROT 6.6  ALBUMIN 4.2    Lab 10/19/12 0853  LIPASE 32  AMYLASE --   No results found for  this basename: AMMONIA:5 in the last 168 hours CBC:  Lab 10/19/12 0853  WBC 4.6  NEUTROABS --  HGB 13.9  HCT 40.0  MCV 95.7  PLT 139*   Cardiac Enzymes:  Lab 10/19/12 2055 10/19/12 1621 10/19/12 0857  CKTOTAL -- -- --  CKMB -- -- --  CKMBINDEX -- -- --  TROPONINI <0.30 <0.30 <0.30   BNP (last 3 results)  Basename 10/19/12 1621  PROBNP 125.8*   CBG: No results found for this basename: GLUCAP:5 in the last 168 hours  No results found for this or any previous visit (from the past 240 hour(s)).   Studies: Dg Chest 2 View  10/19/2012  *RADIOLOGY REPORT*  Clinical Data: Chest pressure.  CHEST - 2 VIEW  Comparison: PA and lateral chest 10/16/2011 and 01/20/2011.  Findings: Mild, patchy airspace opacity is seen in the left mid lung zone on the PA view only.  The right lung is clear.  Heart size is normal.  No pneumothorax or pleural fluid.  IMPRESSION: Mild patchy airspace opacity in the left midlung zone on the PA view is likely due to atelectasis rather than infection.   Original Report Authenticated By: Holley Dexter, M.D.     Scheduled Meds:   . B-complex with vitamin C  1 tablet Oral Daily  . enoxaparin (LOVENOX) injection  40 mg Subcutaneous Q24H  . escitalopram  20 mg Oral Daily  .  ferrous sulfate  325 mg Oral Q breakfast  . ibuprofen  600 mg Oral TID  . lamoTRIgine  150 mg Oral Daily  . pantoprazole  40 mg Oral Daily  . sodium chloride  3 mL Intravenous Q12H   Continuous Infusions:   Principal Problem:  *Chest pain Active Problems:  THROMBOCYTOPENIA  DIABETES MELLITUS, TYPE II    Hector Johannes A, MD  [facility] Pager [phone]. If 7PM-7AM, please contact night-coverage at [email or web address], password [facility] 10/20/2012, 11:12 AM  LOS: 1 day

## 2012-10-20 NOTE — Consult Note (Signed)
CARDIOLOGY CONSULT NOTE  Patient ID: PAMELA MADDY, MRN: [id number], DOB/AGE: 1961-02-12 52 y.o. Admit date: 10/19/2012 Date of Consult: 10/20/2012  Primary Physician: Lorretta Harp, MD Primary Cardiologist: None  Chief Complaint: chest pain Reason for Consultation: chest pain  HPI: 52 y.o. male w/ PMHx significant for Gastric Bypass (2011), Perforated Gastric Ulcer s/p repair 2011, and recent right shoulder surgery (09/16/12) who presented to [facility] on 10/19/2012 with complaints of chest pain.  No prior cardiac history. Has family history of early CAD (Father MI 32, grandparents MI 3s). Has history of HTN, HLD, OSA, and DMII that improved after gastric bypass surgery. Evaluated by Dr. Jens Som in 2003 for chest pain at which time a nuc stress showed no ischemia or infarction, EF 66%. His chest pain was felt GI in etiology. No subsequent cardiology follow up. He is very active and able to run 2-4 miles a couple times a week without chest pain or shortness of breath. About 5 days ago noted a substernal chest tightness that occurred while sitting on his couch. No radiation or associated symptoms. It did not worsen with exertion, but he did note increased dyspnea with walking ~360ft to his mailbox, which was new. The chest tightness has remained constant since that time, but has not worsened. The dyspnea with exertion has remained, but no shortness of breath with rest or minimal activity around the house. No change in pain with movement, breathing, or food intake. Denies orthopnea, PND, edema, syncope, palpitations, calf pain, cough, fever, chills, abd pain, melena/hematochezia. He underwent arthroscopic right shoulder rotator cuff repair 09/16/12. He was less active shortly after surgery, but has not been immobile. Has recently been evaluated by Hematology for ? of von Willebrand disorder (see Dr. Cyndie Chime office note 09/27/12).  In the ED, EKG showed NSR 66bpm, no acute ST/T changes. CXR  showed mild patchy airspace opacity in left midlung zone likely due to atelectasis. Labs were significant for normal troponin, Plt 139, otherwise unremarkable CBC/CMET/lipase. He was afebrile and not tachycardic or hypoxic. He was admitted by the medicine service. Cardiac enzymes were cycled and normal x 3. Echo has been completed and the reading is pending. He was given dilaudid and GI cocktail with pain relief (unsure which one relieved pain as they were given together). He also noted improvement after being given protonix yesterday evening, but the pain has returned and did not change with protonix this morning.   Past Medical History  Diagnosis Date  . Hyperlipidemia   . Abnormal LFTs     improved after gastic bypass  . History of concussion   . Gout   . Perforated ulcer     Surgical repair9/24 2011  . B12 nutritional deficiency   . Closed head injury     laceration  9 12  no loc  vision change  . History of renal stone     Seen in emergency room not urologist  . Sleep apnea     had gastric bypass/ sa-gone  . Diabetes mellitus     resolved with gastric bypass     2003 - MYOCARDIAL PERFUSION IMAGING WITH SPECT AT REST AND STRESS IMPRESSION NO PERFUSION DEFECTS TO SUGGEST ISCHEMIA OR INFARCTION.  NORMAL LEFT VENTRICULAR WALL MOTION. RESTING LEFT VENTRICULAR EJECTION FRACTION IS 66%.  Surgical History:  Past Surgical History  Procedure Date  . Vasectomy   . Hernia repair     umbilical   . Nasal septoplasty w/ turbinoplasty     x 2  .  Gastric bypass 11/06/09    beginning weight 267  . Epidural block injection     by Dr. Ethelene Hal  . Surgerical repair of stomach ulcer 06/08/10    per  . Back surgery     Dr. Shelle Iron     Home Meds: Medication Sig  albuterol (PROVENTIL,VENTOLIN) 90 MCG/ACT inhaler Inhale 2 puffs into the lungs every 6 (six) hours as needed. For shortness of breath.  amitriptyline (ELAVIL) 50 MG tablet Take 1 tablet (50 mg total) by mouth at bedtime as needed for  sleep. TAKE 1 TABLET (50 MG TOTAL) BY MOUTH AT BEDTIME AS NEEDED FOR SLEEP.  B Complex-C (B-COMPLEX WITH VITAMIN C) tablet Take 1 tablet by mouth daily.  calcium citrate (CALCITRATE) 950 MG tablet Take 1 tablet by mouth daily.   Cholecalciferol (VITAMIN D) 1000 UNITS capsule Take 1 capsule (1,000 Units total) by mouth daily.  ENDOCET 5-325 MG per tablet Take 1 tablet by mouth every 8 (eight) hours as needed. For pain  escitalopram (LEXAPRO) 20 MG tablet Take 20 mg by mouth daily.  ferrous sulfate 325 (65 FE) MG tablet Take 325 mg by mouth daily with breakfast.  indomethacin (INDOCIN) 50 MG capsule Take 1 capsule (50 mg total) by mouth 3 (three) times daily with meals. TAKE ONE CAPSULE BY MOUTH 3 TIMES A DAYas needed for gout and headaches  lamoTRIgine (LAMICTAL) 150 MG tablet Take 150 mg by mouth every morning.    methocarbamol (ROBAXIN) 750 MG tablet Take 750 mg by mouth 4 (four) times daily as needed. Back pain.  Multiple Vitamins-Minerals (ADULT ONE DAILY GUMMIES) CHEW Chew 1 tablet by mouth daily.   omeprazole (PRILOSEC) 20 MG capsule Take 20 mg by mouth daily.  zolpidem (AMBIEN) 10 MG tablet Take 10 mg by mouth at bedtime as needed. For sleep  glucose blood (ONE TOUCH TEST STRIPS) test strip Use as instructed    Inpatient Medications:   . B-complex with vitamin C  1 tablet Oral Daily  . enoxaparin (LOVENOX) injection  40 mg Subcutaneous Q24H  . escitalopram  20 mg Oral Daily  . ferrous sulfate  325 mg Oral Q breakfast  . ibuprofen  600 mg Oral TID  . lamoTRIgine  150 mg Oral Daily  . pantoprazole  40 mg Oral Daily  . sodium chloride  3 mL Intravenous Q12H      Allergies:  Allergies  Allergen Reactions  . Aspirin     REACTION: Pt had a allergic reaction as a perforated ulcer  . Hydrocodone Other (See Comments)    headache    History   Social History  . Marital Status: Married    Spouse Name: N/A    Number of Children: N/A  . Years of Education: N/A   Occupational  History  . Not on file.   Social History Main Topics  . Smoking status: Never Smoker   . Smokeless tobacco: Not on file  . Alcohol Use: No     Comment: socially  . Drug Use: No  . Sexually Active: Not on file   Other Topics Concern  . 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 Uterine cancer Mother   . Hypertension Mother   . Cervical cancer Mother   . Heart disease Father   . COPD Father      Review of  Systems: General: negative for chills, fever, night sweats or weight changes.  Cardiovascular: As per HPI Dermatological: negative for rash Respiratory: negative for cough or wheezing Urologic: negative for hematuria Abdominal: negative for nausea, vomiting, diarrhea, bright red blood per rectum, melena, or hematemesis Neurologic: negative for visual changes, syncope, or dizziness All other systems reviewed and are otherwise negative except as noted above.  Labs:  [facility] 10/19/12 2055 10/19/12 1621 10/19/12 0857  TROPONINI <0.30 <0.30 <0.30   Component Value Date   WBC 4.6 10/19/2012   HGB 13.9 10/19/2012   HCT 40.0 10/19/2012   MCV 95.7 10/19/2012   PLT 139* 10/19/2012    Lab 10/19/12 0853  NA 141  K 4.0  CL 105  CO2 27  BUN 7  CREATININE 1.09  CALCIUM 9.3  PROT 6.6  BILITOT 0.3  ALKPHOS 65  ALT 14  AST 24  GLUCOSE 105*   Component Value Date   CHOL 164 08/30/2012   HDL 44.60 08/30/2012   LDLCALC 85 08/30/2012   TRIG 170.0* 08/30/2012     10/19/2012 16:21  Pro B Natriuretic peptide (BNP) 125.8 (H)   Radiology/Studies:    10/19/2012 - CHEST - 2 VIEW   Findings: Mild, patchy airspace opacity is seen in the left mid lung zone on the PA view only.  The right lung is clear.  Heart size is normal.  No pneumothorax or pleural fluid.  IMPRESSION: Mild patchy airspace opacity in the left midlung zone on the PA  view is likely due to atelectasis rather than infection.       EKG: 10/19/12 NSR 66bpm, no acute ST/T changes  Physical Exam: Blood pressure 131/90, pulse 69, temperature 97.8 F (36.6 C), temperature source Oral, resp. rate 18, height 5\' 10"  (1.778 m), weight 193 lb 14.4 oz (87.952 kg), SpO2 98.00%. General: Well developed, well nourished, white male in no acute distress. Head: Normocephalic, atraumatic, sclera non-icteric, no xanthomas, nares are without discharge.  Neck: Supple. Negative for carotid bruits or JVD Lungs: Clear bilaterally to auscultation without wheezes, rales, or rhonchi. Breathing is unlabored. Heart: RRR with S1 S2. No murmurs, rubs, or gallops appreciated. Abdomen: Soft, non-tender, non-distended with normoactive bowel sounds.  No rebound/guarding. No obvious abdominal masses. Msk:  Strength and tone appear normal for age. Extremities: No clubbing or cyanosis. No edema or palpable cord.  Distal pedal pulses are intact and equal bilaterally. Neuro: Alert and oriented X 3. Moves all extremities spontaneously. Psych:  Responds to questions appropriately with a normal affect.   Assessment and Plan:  52 y.o. male w/ PMHx significant for Gastric Bypass (2011), Perforated Gastric Ulcer s/p repair 2011, and recent right shoulder surgery (09/16/12) who presented to [facility] on 10/19/2012 with complaints of chest pain.  1. Precordial Pain  2. Dyspnea with exertion 3. Normal myoview 2003 4. Family h/o early CAD 5. Recent Right Shoulder Repair 6. H/o gastric bypass and perforated gastric ulcer 7. H/o HTN, HLD, DM, and OSA improved after gastric bypass  Patient presents with atypical chest pain that has been constant for five days with normal cardiac enzymes and nonischemic EKG. Do not suspect cardiac in etiology. EKG and symptomatology do not point to pericarditis. Low suspicion for PE.  Will assess for pericardial effusion, RV strain, and WMAs on echocardiogram. Plan  for exercise treadmill test today. If normal can be dc'd from cardiac standpoint today without further cardiac testing.   Signed, Trellis Vanoverbeke PA-C 10/20/2012, 11:46 AM

## 2012-10-20 NOTE — Progress Notes (Signed)
  Echocardiogram 2D Echocardiogram has been performed.  Cathie Beams 10/20/2012, 9:43 AM

## 2012-10-20 NOTE — Consult Note (Signed)
Prolonged atypical chest pain with negative enzymes and normal ECG.  Previous normal myovue 2003 Patient able to walk  Will do ETT today and discharge if negative.  Exam unremarkable  Hector [name]

## 2012-10-20 NOTE — Discharge Summary (Signed)
Physician Discharge Summary  Hector [name] WGN:[id number] DOB: 20-Oct-1960 DOA: 10/19/2012  PCP: Lorretta Harp, MD  Admit date: 10/19/2012 Discharge date: 10/20/2012  Time spent: 25 minutes  Recommendations for Outpatient Follow-up:  1. Please followup with Lorretta Harp, MD (PCP) in 1 week.  Discharge Diagnoses:  Principal Problem:  *Chest pain Active Problems:  THROMBOCYTOPENIA  DIABETES MELLITUS, TYPE II   Discharge Condition: Stable  Diet recommendation: Heart healthy diet  Filed Weights   10/19/12 1133 10/20/12 0542  Weight: 86.183 kg (190 lb) 87.952 kg (193 lb 14.4 oz)    History of present illness:  On admission: "Hector [name] is a 52 y.o. 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 Cyndie Chime), present with a CC of chest pain."  Hospital Course:  Chest pain  Etiology unclear. Patient ruled out for ACS with negative cardiac enzymes. 2D Echo done with results below. Stress test in 2003 negative. Cardiology evaluated the patient and a treadmill stress test was done which was normal.   Thrombocytopenia  Stable. Chronic. Has had workup done by Dr. Cyndie Chime.   History of Diabetes?  Hemoglobin A1C on in July of 2013 was 6.0, appears to be diet controlled. Resolved with weight loss.  Procedures: 2D Echo on 10/20/2012 Study Conclusions - Left ventricle: The cavity size was normal. Wall thickness was normal. Systolic function was normal. The estimated ejection fraction was in the range of 60% to 65%. Wall motion was normal; there were no regional wall motion abnormalities. Doppler parameters are consistent with abnormal left ventricular relaxation (grade 1 diastolic dysfunction). - Aortic valve: Mild regurgitation. - Right ventricle: The cavity size was mildly dilated. - Right atrium: The atrium was mildly dilated. - Atrial  septum: There was an atrial septal aneurysm.  Exercise stress test 10/20/2012 Normal  Consultations:  Cardiology  Discharge Exam: Filed Vitals:   10/19/12 1133 10/19/12 2300 10/20/12 0542 10/20/12 1400  BP: 166/96 131/80 131/90 149/90  Pulse: 59 69 69 79  Temp: 98.2 F (36.8 C) 97.8 F (36.6 C) 97.8 F (36.6 C) 98.7 F (37.1 C)  TempSrc:  Oral Oral Oral  Resp: 18 18 18 18   Height: 5\' 10"  (1.778 m)     Weight: 86.183 kg (190 lb)  87.952 kg (193 lb 14.4 oz)   SpO2: 100% 97% 98% 96%   Discharge Instructions  Discharge Orders    Future Appointments: Provider: Department: Dept Phone: Center:   10/25/2012 2:30 PM Levert Feinstein, MD [facility] MEDICAL ONCOLOGY [phone] None   12/20/2012 8:30 AM [facility] [place] [phone] [facility]   12/27/2012 8:30 AM Madelin Headings, MD [facility] [place] [phone] [facility]     Future Orders Please Complete By Expires   Diet - low sodium heart healthy      Increase activity slowly      Discharge instructions      Comments:   Please followup with Lorretta Harp, MD (PCP) in 1 week.       Medication List     As of 10/20/2012  2:52 PM    STOP taking these medications         omeprazole 20 MG capsule   Commonly known as: PRILOSEC   Replaced by: pantoprazole 40 MG tablet      TAKE these medications         ADULT ONE DAILY  GUMMIES Chew   Chew 1 tablet by mouth daily.      albuterol 90 MCG/ACT inhaler   Commonly known as: PROVENTIL,VENTOLIN   Inhale 2 puffs into the lungs every 6 (six) hours as needed. For shortness of breath.      amitriptyline 50 MG tablet   Commonly known as: ELAVIL   Take 1 tablet (50 mg total) by mouth at bedtime as needed for sleep. TAKE 1 TABLET (50 MG TOTAL) BY MOUTH AT BEDTIME AS NEEDED FOR SLEEP.      B-complex with vitamin C tablet   Take 1 tablet by mouth daily.      calcium citrate 950 MG tablet   Commonly known as:  CALCITRATE - dosed in mg elemental calcium   Take 1 tablet by mouth daily.      ENDOCET 5-325 MG per tablet   Generic drug: oxyCODONE-acetaminophen   Take 1 tablet by mouth every 8 (eight) hours as needed. For pain      escitalopram 20 MG tablet   Commonly known as: LEXAPRO   Take 20 mg by mouth daily.      ferrous sulfate 325 (65 FE) MG tablet   Take 325 mg by mouth daily with breakfast.      glucose blood test strip   Use as instructed      indomethacin 50 MG capsule   Commonly known as: INDOCIN   Take 1 capsule (50 mg total) by mouth 3 (three) times daily with meals. TAKE ONE CAPSULE BY MOUTH 3 TIMES A DAYas needed for gout and headaches      lamoTRIgine 150 MG tablet   Commonly known as: LAMICTAL   Take 150 mg by mouth every morning.      methocarbamol 750 MG tablet   Commonly known as: ROBAXIN   Take 750 mg by mouth 4 (four) times daily as needed. Back pain.      pantoprazole 40 MG tablet   Commonly known as: PROTONIX   Take 1 tablet (40 mg total) by mouth daily.      UNABLE TO FIND   Please excuse Mr. Dimaria from any work obligations from 10/19/2012 and 10/20/2012 as he was in the hospital. Return to work on 10/21/2012 without any restrictions.      Vitamin D 1000 UNITS capsule   Take 1 capsule (1,000 Units total) by mouth daily.      zolpidem 10 MG tablet   Commonly known as: AMBIEN   Take 10 mg by mouth at bedtime as needed. For sleep          The results of significant diagnostics from this hospitalization (including imaging, microbiology, ancillary and laboratory) are listed below for reference.    Significant Diagnostic Studies: Dg Chest 2 View  10/19/2012  *RADIOLOGY REPORT*  Clinical Data: Chest pressure.  CHEST - 2 VIEW  Comparison: PA and lateral chest 10/16/2011 and 01/20/2011.  Findings: Mild, patchy airspace opacity is seen in the left mid lung zone on the PA view only.  The right lung is clear.  Heart size is normal.  No pneumothorax or pleural fluid.   IMPRESSION: Mild patchy airspace opacity in the left midlung zone on the PA view is likely due to atelectasis rather than infection.   Original Report Authenticated By: Holley Dexter, M.D.     Microbiology: No results found for this or any previous visit (from the past 240 hour(s)).   Labs: Basic Metabolic Panel:  Lab 10/19/12 6578  NA 141  K 4.0  CL 105  CO2 27  GLUCOSE 105*  BUN 7  CREATININE 1.09  CALCIUM 9.3  MG --  PHOS --   Liver Function Tests:  Lab 10/19/12 0853  AST 24  ALT 14  ALKPHOS 65  BILITOT 0.3  PROT 6.6  ALBUMIN 4.2    Lab 10/19/12 0853  LIPASE 32  AMYLASE --   No results found for this basename: AMMONIA:5 in the last 168 hours CBC:  Lab 10/19/12 0853  WBC 4.6  NEUTROABS --  HGB 13.9  HCT 40.0  MCV 95.7  PLT 139*   Cardiac Enzymes:  Lab 10/19/12 2055 10/19/12 1621 10/19/12 0857  CKTOTAL -- -- --  CKMB -- -- --  CKMBINDEX -- -- --  TROPONINI <0.30 <0.30 <0.30   BNP: BNP (last 3 results)  Basename 10/19/12 1621  PROBNP 125.8*   CBG: No results found for this basename: GLUCAP:5 in the last 168 hours     Signed:  Anet [place] A  [facility] 10/20/2012, 2:52 PM

## 2012-10-21 NOTE — Care Management Utilization Note (Signed)
UR complete 

## 2012-10-25 ENCOUNTER — Ambulatory Visit: Payer: Federal, State, Local not specified - PPO | Admitting: Oncology

## 2012-10-28 ENCOUNTER — Encounter: Payer: Self-pay | Admitting: Internal Medicine

## 2012-11-08 ENCOUNTER — Other Ambulatory Visit: Payer: Self-pay | Admitting: Internal Medicine

## 2012-11-11 ENCOUNTER — Encounter (HOSPITAL_COMMUNITY): Payer: Self-pay | Admitting: Emergency Medicine

## 2012-11-11 ENCOUNTER — Emergency Department (HOSPITAL_COMMUNITY)
Admission: EM | Admit: 2012-11-11 | Discharge: 2012-11-11 | Disposition: A | Discharge status: Home / Self Care | Payer: Federal, State, Local not specified - PPO | Attending: Emergency Medicine | Admitting: Emergency Medicine

## 2012-11-11 ENCOUNTER — Emergency Department (HOSPITAL_COMMUNITY): Payer: Federal, State, Local not specified - PPO

## 2012-11-11 DIAGNOSIS — Z862 Personal history of diseases of the blood and blood-forming organs and certain disorders involving the immune mechanism: Secondary | ICD-10-CM | POA: Insufficient documentation

## 2012-11-11 DIAGNOSIS — E119 Type 2 diabetes mellitus without complications: Secondary | ICD-10-CM | POA: Insufficient documentation

## 2012-11-11 DIAGNOSIS — Z8639 Personal history of other endocrine, nutritional and metabolic disease: Secondary | ICD-10-CM | POA: Insufficient documentation

## 2012-11-11 DIAGNOSIS — Z79899 Other long term (current) drug therapy: Secondary | ICD-10-CM | POA: Insufficient documentation

## 2012-11-11 DIAGNOSIS — R110 Nausea: Secondary | ICD-10-CM | POA: Insufficient documentation

## 2012-11-11 DIAGNOSIS — Z87828 Personal history of other (healed) physical injury and trauma: Secondary | ICD-10-CM | POA: Insufficient documentation

## 2012-11-11 DIAGNOSIS — M109 Gout, unspecified: Secondary | ICD-10-CM | POA: Insufficient documentation

## 2012-11-11 DIAGNOSIS — N200 Calculus of kidney: Secondary | ICD-10-CM

## 2012-11-11 DIAGNOSIS — Z8711 Personal history of peptic ulcer disease: Secondary | ICD-10-CM | POA: Insufficient documentation

## 2012-11-11 LAB — POCT I-STAT, CHEM 8
BUN: 13 mg/dL (ref 6–23)
Creatinine, Ser: 1.6 mg/dL — ABNORMAL HIGH (ref 0.50–1.35)
Glucose, Bld: 107 mg/dL — ABNORMAL HIGH (ref 70–99)
Hemoglobin: 15.6 g/dL (ref 13.0–17.0)
Potassium: 4 mEq/L (ref 3.5–5.1)

## 2012-11-11 LAB — CBC WITH DIFFERENTIAL/PLATELET
Eosinophils Relative: 2 % (ref 0–5)
HCT: 42.6 % (ref 39.0–52.0)
Lymphocytes Relative: 22 % (ref 12–46)
Lymphs Abs: 1.3 10*3/uL (ref 0.7–4.0)
MCV: 95.7 fL (ref 78.0–100.0)
Monocytes Absolute: 0.4 10*3/uL (ref 0.1–1.0)
RBC: 4.45 MIL/uL (ref 4.22–5.81)
WBC: 5.8 10*3/uL (ref 4.0–10.5)

## 2012-11-11 LAB — URINALYSIS, ROUTINE W REFLEX MICROSCOPIC
Glucose, UA: NEGATIVE mg/dL
Specific Gravity, Urine: 1.02 (ref 1.005–1.030)

## 2012-11-11 LAB — URINE MICROSCOPIC-ADD ON

## 2012-11-11 MED ORDER — KETOROLAC TROMETHAMINE 30 MG/ML IJ SOLN
30.0000 mg | Freq: Once | INTRAMUSCULAR | Status: AC
  Administered 2012-11-11: 30 mg via INTRAVENOUS
  Filled 2012-11-11: qty 1

## 2012-11-11 MED ORDER — OXYCODONE-ACETAMINOPHEN 5-325 MG PO TABS: 1.0000 | ORAL_TABLET | ORAL | Status: DC | PRN

## 2012-11-11 MED ORDER — TAMSULOSIN HCL 0.4 MG PO CAPS
0.4000 mg | ORAL_CAPSULE | Freq: Every day | ORAL | Status: DC
  Administered 2012-11-11: 0.4 mg via ORAL
  Filled 2012-11-11: qty 1

## 2012-11-11 MED ORDER — TAMSULOSIN HCL 0.4 MG PO CAPS: 0.4000 mg | ORAL_CAPSULE | Freq: Every day | ORAL | Status: DC

## 2012-11-11 MED ORDER — ONDANSETRON HCL 4 MG/2ML IJ SOLN
4.0000 mg | Freq: Once | INTRAMUSCULAR | Status: AC
  Administered 2012-11-11: 4 mg via INTRAVENOUS
  Filled 2012-11-11: qty 2

## 2012-11-11 MED ORDER — IBUPROFEN 600 MG PO TABS: 600.0000 mg | ORAL_TABLET | Freq: Four times a day (QID) | ORAL | Status: DC | PRN

## 2012-11-11 MED ORDER — ONDANSETRON 8 MG PO TBDP: 8.0000 mg | ORAL_TABLET | Freq: Three times a day (TID) | ORAL | Status: DC | PRN

## 2012-11-11 MED ORDER — HYDROMORPHONE HCL PF 1 MG/ML IJ SOLN
1.0000 mg | Freq: Once | INTRAMUSCULAR | Status: AC
  Administered 2012-11-11: 1 mg via INTRAVENOUS
  Filled 2012-11-11: qty 1

## 2012-11-11 NOTE — ED Notes (Signed)
Orders acknowledged by Crist Kruszka, and urine collected by Tamarius Rosenfield, rn. Documented under wrong name.

## 2012-11-11 NOTE — ED Notes (Signed)
Pt arrived from home and presents to ED with c/o sudden onset of right flank pain with nausea; pt states he has hx of kidney stones.

## 2012-11-11 NOTE — ED Provider Notes (Signed)
History     CSN: [id number]  Arrival date & time 11/11/12  7425   First MD Initiated Contact with Patient 11/11/12 0700      Chief Complaint  Patient presents with  . Flank Pain  . Nausea    (Consider location/radiation/quality/duration/timing/severity/associated sxs/prior treatment) HPI Hector [name] is a 52 y.o. male who presents with complaint of a right flank pain. Pain started about 5 hrs ago, worsening. States in the right flank, radiating into right abdomen. No dysuria, no hematuria. Admits to nausea, no vomiting. No changes in bowels. No fever, chills. Nothing makes pain better or worse. Did not take anything for pain this morning.    Past Medical History  Diagnosis Date  . Hyperlipidemia   . Abnormal LFTs     improved after gastic bypass  . History of concussion   . Gout   . Perforated ulcer     Surgical repair9/24 2011  . B12 nutritional deficiency   . Closed head injury     laceration  9 12  no loc  vision change  . History of renal stone     Seen in emergency room not urologist  . Sleep apnea     had gastric bypass/ sa-gone  . Diabetes mellitus     resolved with gastric bypass  . Chest pain     hopsitalized 2 14 evaluation    Past Surgical History  Procedure Laterality Date  . Vasectomy    . Hernia repair      umbilical   . Nasal septoplasty w/ turbinoplasty      x 2  . Gastric bypass  11/06/09    beginning weight 267  . Epidural block injection      by Dr. Ethelene Hal  . Surgerical repair of stomach ulcer  06/08/10    per  . Back surgery      Dr. Shelle Iron    Family History  Problem Relation Age of Onset  . Uterine cancer Mother   . Hypertension Mother   . Cervical cancer Mother   . Heart disease Father   . COPD Father     History  Substance Use Topics  . Smoking status: Never Smoker   . Smokeless tobacco: Not on file  . Alcohol Use: No     Comment: socially      Review of Systems  Constitutional: Negative for fever and chills.  HENT:  Negative for neck pain.   Respiratory: Negative.   Cardiovascular: Negative.   Gastrointestinal: Positive for nausea and abdominal pain. Negative for vomiting, diarrhea, constipation and blood in stool.  Genitourinary: Positive for flank pain. Negative for dysuria, hematuria, scrotal swelling, difficulty urinating and testicular pain.  Musculoskeletal: Negative.   Skin: Negative.   Neurological: Negative for weakness, numbness and headaches.    Allergies  Aspirin and Hydrocodone  Home Medications   Current Outpatient Rx  Name  Route  Sig  Dispense  Refill  . albuterol (PROVENTIL,VENTOLIN) 90 MCG/ACT inhaler   Inhalation   Inhale 2 puffs into the lungs every 6 (six) hours as needed. For shortness of breath.         [name] amitriptyline (ELAVIL) 50 MG tablet   Oral   Take 1 tablet (50 mg total) by mouth at bedtime as needed for sleep. TAKE 1 TABLET (50 MG TOTAL) BY MOUTH AT BEDTIME AS NEEDED FOR SLEEP.   90 tablet   3   . B Complex-C (B-COMPLEX WITH VITAMIN C) tablet   Oral  Take 1 tablet by mouth daily.         . calcium citrate (CALCITRATE) 950 MG tablet   Oral   Take 1 tablet by mouth daily.          . Cholecalciferol (VITAMIN D) 1000 UNITS capsule   Oral   Take 1 capsule (1,000 Units total) by mouth daily.   30 capsule   11   . ENDOCET 5-325 MG per tablet   Oral   Take 1 tablet by mouth every 8 (eight) hours as needed. For pain         . escitalopram (LEXAPRO) 20 MG tablet   Oral   Take 20 mg by mouth daily.         . ferrous sulfate 325 (65 FE) MG tablet   Oral   Take 325 mg by mouth daily with breakfast.         . glucose blood (ONE TOUCH TEST STRIPS) test strip      Use as instructed   100 each   12   . indomethacin (INDOCIN) 50 MG capsule   Oral   Take 1 capsule (50 mg total) by mouth 3 (three) times daily with meals. TAKE ONE CAPSULE BY MOUTH 3 TIMES A DAYas needed for gout and headaches   40 capsule   1   . indomethacin (INDOCIN) 50 MG  capsule      TAKE ONE CAPSULE BY MOUTH 3 TIMES A DAY AS NEEDED.   40 capsule   1   . lamoTRIgine (LAMICTAL) 150 MG tablet   Oral   Take 150 mg by mouth every morning.           . methocarbamol (ROBAXIN) 750 MG tablet   Oral   Take 750 mg by mouth 4 (four) times daily as needed. Back pain.         . Multiple Vitamins-Minerals (ADULT ONE DAILY GUMMIES) CHEW   Oral   Chew 1 tablet by mouth daily.          . pantoprazole (PROTONIX) 40 MG tablet   Oral   Take 1 tablet (40 mg total) by mouth daily.   30 tablet   0   . UNABLE TO FIND      Please excuse Mr. Minniefield from any work obligations from 10/19/2012 and 10/20/2012 as he was in the hospital. Return to work on 10/21/2012 without any restrictions.   1 Units   0   . zolpidem (AMBIEN) 10 MG tablet   Oral   Take 10 mg by mouth at bedtime as needed. For sleep           BP 147/90  Pulse 77  Temp(Src) 97.8 F (36.6 C) (Oral)  Resp 18  SpO2 100%  Physical Exam  Constitutional: He is oriented to person, place, and time. He appears well-developed and well-nourished. No distress.  Eyes: Conjunctivae are normal.  Neck: Neck supple.  Cardiovascular: Normal rate, regular rhythm and normal heart sounds.   Pulmonary/Chest: Effort normal and breath sounds normal. No respiratory distress. He has no wheezes. He has no rales.  Abdominal: Soft. Bowel sounds are normal. He exhibits no distension. There is no tenderness. There is no rebound and no guarding.  Right CVA tenderness  Musculoskeletal: He exhibits no edema.  Neurological: He is alert and oriented to person, place, and time.  Skin: Skin is warm and dry.    ED Course  Procedures (including critical care time)  Results for orders placed  during the hospital encounter of 11/11/12  URINALYSIS, ROUTINE W REFLEX MICROSCOPIC      Result Value Range   Color, Urine YELLOW  YELLOW   APPearance CLOUDY (*) CLEAR   Specific Gravity, Urine 1.020  1.005 - 1.030   pH 5.5  5.0 - 8.0    Glucose, UA NEGATIVE  NEGATIVE mg/dL   Hgb urine dipstick MODERATE (*) NEGATIVE   Bilirubin Urine NEGATIVE  NEGATIVE   Ketones, ur NEGATIVE  NEGATIVE mg/dL   Protein, ur NEGATIVE  NEGATIVE mg/dL   Urobilinogen, UA 0.2  0.0 - 1.0 mg/dL   Nitrite NEGATIVE  NEGATIVE   Leukocytes, UA NEGATIVE  NEGATIVE  CBC WITH DIFFERENTIAL      Result Value Range   WBC 5.8  4.0 - 10.5 K/uL   RBC 4.45  4.22 - 5.81 MIL/uL   Hemoglobin 14.2  13.0 - 17.0 g/dL   HCT 16.1  09.6 - 04.5 %   MCV 95.7  78.0 - 100.0 fL   MCH 31.9  26.0 - 34.0 pg   MCHC 33.3  30.0 - 36.0 g/dL   RDW 40.9  81.1 - 91.4 %   Platelets 139 (*) 150 - 400 K/uL   Neutrophils Relative 69  43 - 77 %   Neutro Abs 4.0  1.7 - 7.7 K/uL   Lymphocytes Relative 22  12 - 46 %   Lymphs Abs 1.3  0.7 - 4.0 K/uL   Monocytes Relative 6  3 - 12 %   Monocytes Absolute 0.4  0.1 - 1.0 K/uL   Eosinophils Relative 2  0 - 5 %   Eosinophils Absolute 0.1  0.0 - 0.7 K/uL   Basophils Relative 1  0 - 1 %   Basophils Absolute 0.0  0.0 - 0.1 K/uL  URINE MICROSCOPIC-ADD ON      Result Value Range   Squamous Epithelial / LPF RARE  RARE   RBC / HPF 3-6  <3 RBC/hpf   Bacteria, UA RARE  RARE  POCT I-STAT, CHEM 8      Result Value Range   Sodium 144  135 - 145 mEq/L   Potassium 4.0  3.5 - 5.1 mEq/L   Chloride 106  96 - 112 mEq/L   BUN 13  6 - 23 mg/dL   Creatinine, Ser 7.82 (*) 0.50 - 1.35 mg/dL   Glucose, Bld 956 (*) 70 - 99 mg/dL   Calcium, Ion 2.13 (*) 1.12 - 1.23 mmol/L   TCO2 28  0 - 100 mmol/L   Hemoglobin 15.6  13.0 - 17.0 g/dL   HCT 08.6  57.8 - 46.9 %   Ct Abdomen Pelvis Wo Contrast  11/11/2012  *RADIOLOGY REPORT*  Clinical Data: Right-sided flank pain.  Nausea.  CT ABDOMEN AND PELVIS WITHOUT CONTRAST  Technique:  Multidetector CT imaging of the abdomen and pelvis was performed following the standard protocol without intravenous contrast.  Comparison: CT of the abdomen and pelvis 07/21/2011.  Findings:  Lung Bases: Minimal dependent subsegmental  atelectasis is noted in the lower lobes of the lungs bilaterally.  Abdomen/Pelvis:  Image 81 of series 2 demonstrates a 3 mm stone immediately proximal to the right ureterovesicular junction.  This is associated with mild right-sided hydroureteronephrosis. Additional 1 mm nonobstructive calculus is noted in the right lower pole collecting system of the right kidney, and a 4 mm nonobstructive calculus is noted in the lower pole collecting system of the left kidney.  No left-sided ureteral stones or bladder stones are identified  on today's examination.  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  The liver is otherwise unremarkable in appearance on this noncontrast examination.  The unenhanced appearance of the gallbladder, pancreas, spleen and bilateral adrenal glands is unremarkable.  Postoperative changes of gastric bypass are noted.  No significant volume of ascites.  No pneumoperitoneum.  No pathologic distension of small bowel.  No definite pathologic lymphadenopathy identified within the abdomen or pelvis on today's noncontrast CT examination.  Normal appendix.  Musculoskeletal: There are no aggressive appearing lytic or blastic lesions noted in the visualized portions of the skeleton.  IMPRESSION: 1.  3 mm partially obstructive calculus immediately proximal to the right ureterovesicular junction with mild right-sided hydroureteronephrosis. 2.  Additional small bilateral nonobstructive calculi in the collecting systems of the kidneys. 3.  Status post gastric bypass.   Original Report Authenticated By: Trudie Reed, M.D.     8:50 AM CT showing a 3mm partially obstructive calculus. Pt feeling much better with pain medications. He is comfortable. Creatinine slighty bumped, will have him recheck it closely. Urine not infected. Pt comfortable going home with pain meds and follow up.        1. Nephrolithiasis       MDM  PT with right flank pain. CT abd/pelvis  showed a 3mm partially obstructing calculus. Pt's pain controled with 1mg  of dilaudid IV, zofran 4mg  IV, toradol 30mg  IV. Pt feeling much better. Stable for d/c home with percocet, ibuprofen, zofran, flomax, follow up with urology.   Filed Vitals:   11/11/12 0706 11/11/12 0810  BP: 147/90   Pulse: 77 77  Temp: 97.8 F (36.6 C)   TempSrc: Oral   Resp: 18 16  SpO2: 100% 96%           Lottie Mussel, PA 11/11/12 1617

## 2012-11-11 NOTE — ED Notes (Signed)
Pt escorted to discharge window. Pt verbalized understanding discharge instructions. In no acute distress.  

## 2012-11-14 NOTE — ED Provider Notes (Signed)
Medical screening examination/treatment/procedure(s) were performed by non-physician practitioner and as supervising physician I was immediately available for consultation/collaboration.  Gilda Crease, MD 11/14/12 1341

## 2012-12-11 ENCOUNTER — Other Ambulatory Visit: Payer: Self-pay | Admitting: Internal Medicine

## 2012-12-20 ENCOUNTER — Other Ambulatory Visit (INDEPENDENT_AMBULATORY_CARE_PROVIDER_SITE_OTHER): Payer: Federal, State, Local not specified - PPO

## 2012-12-20 DIAGNOSIS — E559 Vitamin D deficiency, unspecified: Secondary | ICD-10-CM

## 2012-12-20 DIAGNOSIS — E538 Deficiency of other specified B group vitamins: Secondary | ICD-10-CM

## 2012-12-20 LAB — VITAMIN B12: Vitamin B-12: >1500 pg/mL — ABNORMAL HIGH (ref 211–911)

## 2012-12-20 LAB — CBC WITH DIFFERENTIAL/PLATELET
Basophils Relative: 0.6 % (ref 0.0–3.0)
Eosinophils Relative: 3.6 % (ref 0.0–5.0)
HCT: 43 % (ref 39.0–52.0)
Hemoglobin: 14.4 g/dL (ref 13.0–17.0)
Lymphs Abs: 1.8 10*3/uL (ref 0.7–4.0)
MCV: 96.3 fl (ref 78.0–100.0)
Monocytes Absolute: 0.2 10*3/uL (ref 0.1–1.0)
Monocytes Relative: 5.1 % (ref 3.0–12.0)
Neutro Abs: 2.1 10*3/uL (ref 1.4–7.7)
Platelets: 148 10*3/uL — ABNORMAL LOW (ref 150.0–400.0)
RBC: 4.47 Mil/uL (ref 4.22–5.81)
WBC: 4.3 10*3/uL — ABNORMAL LOW (ref 4.5–10.5)

## 2012-12-27 ENCOUNTER — Ambulatory Visit (INDEPENDENT_AMBULATORY_CARE_PROVIDER_SITE_OTHER): Payer: Federal, State, Local not specified - PPO | Admitting: Internal Medicine

## 2012-12-27 ENCOUNTER — Encounter: Payer: Self-pay | Admitting: Internal Medicine

## 2012-12-27 VITALS — BP 120/82 | HR 91 | Temp 98.1°F | Wt 194.0 lb

## 2012-12-27 DIAGNOSIS — E119 Type 2 diabetes mellitus without complications: Secondary | ICD-10-CM

## 2012-12-27 DIAGNOSIS — Z1211 Encounter for screening for malignant neoplasm of colon: Secondary | ICD-10-CM

## 2012-12-27 DIAGNOSIS — K219 Gastro-esophageal reflux disease without esophagitis: Secondary | ICD-10-CM

## 2012-12-27 DIAGNOSIS — D696 Thrombocytopenia, unspecified: Secondary | ICD-10-CM

## 2012-12-27 DIAGNOSIS — Z9884 Bariatric surgery status: Secondary | ICD-10-CM

## 2012-12-27 DIAGNOSIS — E538 Deficiency of other specified B group vitamins: Secondary | ICD-10-CM

## 2012-12-27 MED ORDER — ONDANSETRON 8 MG PO TBDP: 8.0000 mg | ORAL_TABLET | Freq: Two times a day (BID) | ORAL | Status: DC | PRN

## 2012-12-27 MED ORDER — PANTOPRAZOLE SODIUM 40 MG PO TBEC: 40.0000 mg | DELAYED_RELEASE_TABLET | Freq: Every day | ORAL | Status: DC

## 2012-12-27 NOTE — Patient Instructions (Addendum)
Will arrange referral   For colonoscopy screening  Ok to refill the zofran as needed.   Ok to do protonix  For now. And as needed.  And on day stake indocin.

## 2012-12-27 NOTE — Progress Notes (Signed)
Chief Complaint  Patient presents with  . Follow-up    HPI: Patient comes in today for follow up of  multiple medical problems.   Couch to 5    More energy  Than before and recent   lost weight.    No sig   Lows  but ocass feels that way .   No new cv pulm sx but did get seen in ed for atypical cp that was felt to be from reflux  Asks for refill med if needed no vomiting gi bleeding sx  Asks about getting refill of zofran as needed also  For prn nausea  No neuro sx  Continues to lose weight  Exercising.   Asked about colonsocopy and indeed hasnt had routine screening despite other endoscopies .  Overall doing pretty well.  ROS: See pertinent positives and negatives per HPI. ocass has  No new sx   Past Medical History  Diagnosis Date  . Hyperlipidemia   . Abnormal LFTs     improved after gastic bypass  . History of concussion   . Gout   . Perforated ulcer     Surgical repair9/24 2011  . B12 nutritional deficiency   . Closed head injury     laceration  9 12  no loc  vision change  . History of renal stone     Seen in emergency room not urologist  . Sleep apnea     had gastric bypass/ sa-gone  . Diabetes mellitus     resolved with gastric bypass  . Chest pain     hopsitalized 2 14 evaluation  . OBESITY 12/10/2007    Qualifier: Diagnosis of  By: Fabian Sharp MD, Neta Mends Gastric bypass   . 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     Family History  Problem Relation Age of Onset  . Uterine cancer Mother   . Hypertension Mother   . Cervical cancer Mother   . Heart disease Father   . COPD Father     History   Social History  . Marital Status: Married    Spouse Name: N/A    Number of Children: N/A  . Years of Education: N/A   Social History Main Topics  . Smoking status: Never Smoker   . Smokeless tobacco: None  . Alcohol Use: No     Comment:  socially  . Drug Use: No  . Sexually Active: None   Other Topics Concern  . None   Social History Narrative   Married   Regular exercise-no   Employed [employer] retireing some and less stress      10 hours work     Sleep  Using cpap                Outpatient Encounter Prescriptions as of 12/27/2012  Medication Sig Dispense Refill  . albuterol (PROVENTIL,VENTOLIN) 90 MCG/ACT inhaler Inhale 2 puffs into the lungs every 6 (six) hours as needed. For shortness of breath.      [name] amitriptyline (ELAVIL) 50 MG tablet Take 50 mg by mouth at bedtime as needed for sleep.      . B Complex-C (B-COMPLEX WITH VITAMIN C) tablet Take 1 tablet by mouth daily.      . calcium citrate (CALCITRATE) 950 MG tablet Take 1 tablet by mouth daily.       [name]  Cholecalciferol (VITAMIN D) 1000 UNITS capsule Take 1 capsule (1,000 Units total) by mouth daily.  30 capsule  11  . escitalopram (LEXAPRO) 20 MG tablet Take 20 mg by mouth daily.      . ferrous sulfate 325 (65 FE) MG tablet Take 325 mg by mouth daily with breakfast.      . glucose blood (ONE TOUCH TEST STRIPS) test strip Use as instructed  100 each  12  . indomethacin (INDOCIN) 50 MG capsule TAKE ONE CAPSULE BY MOUTH 3 TIMES A DAY AS NEEDED.  40 capsule  0  . lamoTRIgine (LAMICTAL) 200 MG tablet Take 200 mg by mouth daily.      . methocarbamol (ROBAXIN) 750 MG tablet Take 750 mg by mouth 4 (four) times daily as needed. Back pain.      . Multiple Vitamin (MULTIVITAMIN WITH MINERALS) TABS Take 1 tablet by mouth daily.      [name] oxyCODONE-acetaminophen (PERCOCET) 5-325 MG per tablet Take 1 tablet by mouth every 4 (four) hours as needed for pain.  20 tablet  0  . pantoprazole (PROTONIX) 40 MG tablet Take 1 tablet (40 mg total) by mouth daily.  30 tablet  5  . tamsulosin (FLOMAX) 0.4 MG CAPS Take 1 capsule (0.4 mg total) by mouth daily.  10 capsule  0  . zolpidem (AMBIEN) 10 MG tablet Take 10 mg by mouth at bedtime as needed. For sleep       . [DISCONTINUED] pantoprazole (PROTONIX) 40 MG tablet Take 1 tablet (40 mg total) by mouth daily.  30 tablet  0  . ondansetron (ZOFRAN ODT) 8 MG disintegrating tablet Take 1 tablet (8 mg total) by mouth every 12 (twelve) hours as needed for nausea.  24 tablet  2  . [DISCONTINUED] ibuprofen (ADVIL,MOTRIN) 600 MG tablet Take 1 tablet (600 mg total) by mouth every 6 (six) hours as needed for pain.  30 tablet  0  . [DISCONTINUED] indomethacin (INDOCIN) 50 MG capsule Take 50 mg by mouth 3 (three) times daily as needed (for gout/headaches.).      . [DISCONTINUED] ondansetron (ZOFRAN ODT) 8 MG disintegrating tablet Take 1 tablet (8 mg total) by mouth every 8 (eight) hours as needed for nausea.  10 tablet  0  . [DISCONTINUED] oxyCODONE-acetaminophen (PERCOCET/ROXICET) 5-325 MG per tablet Take 1 tablet by mouth every 8 (eight) hours as needed for pain.       No facility-administered encounter medications on file as of 12/27/2012.    EXAM:  BP 120/82  Pulse 91  Temp(Src) 98.1 F (36.7 C) (Oral)  Wt 194 lb (87.998 kg)  BMI 27.84 kg/m2  SpO2 98%  Body mass index is 27.84 kg/(m^2). Wt Readings from Last 3 Encounters:  12/27/12 194 lb (87.998 kg)  10/20/12 193 lb 14.4 oz (87.952 kg)  09/27/12 200 lb (90.719 kg)    GENERAL: vitals reviewed and listed above, alert, oriented, appears well hydrated and in no acute distress  HEENT: atraumatic, conjunctiva  clear, no obvious abnormalities on inspection of external nose and ears OP : no lesion edema or exudate   NECK: no obvious masses on inspection palpation   LUNGS: clear to auscultation bilaterally, no wheezes, rales or rhonchi, good air movement  CV: HRRR, no clubbing cyanosis or  peripheral edema nl cap refill  Abdomen:  Sof,t normal bowel sounds without hepatosplenomegaly, no guarding rebound or masses no CVA tenderness  MS: moves all extremities without noticeable focal  abnormality Feet intact  PSYCH: pleasant and cooperative, no  obvious  depression or anxiety Lab Results  Component Value Date   WBC 4.3* 12/20/2012   HGB 14.4 12/20/2012   HCT 43.0 12/20/2012   PLT 148.0* 12/20/2012   GLUCOSE 107* 11/11/2012   CHOL 164 08/30/2012   TRIG 170.0* 08/30/2012   HDL 44.60 08/30/2012   LDLDIRECT 113.2 04/01/2007   LDLCALC 85 08/30/2012   ALT 14 10/19/2012   AST 24 10/19/2012   NA 144 11/11/2012   K 4.0 11/11/2012   CL 106 11/11/2012   CREATININE 1.60* 11/11/2012   BUN 13 11/11/2012   CO2 27 10/19/2012   TSH 1.24 08/30/2012   PSA 1.01 08/30/2012   INR 0.97 10/19/2012   HGBA1C 5.6 12/20/2012   MICROALBUR 0.7 05/11/2009    ASSESSMENT AND PLAN:  Discussed the following assessment and plan:  GERD (gastroesophageal reflux disease) - ok to do protonix for now  exp on indoin days  ok to refill zofram for prn use if needed ( less on protonix)   Bariatric surgery status  DIABETES MELLITUS, TYPE II - controleld after  bypass and weight loss   now cw pre diabetic    B12 DEFICIENCY  THROMBOCYTOPENIA  Colon cancer screening -  never got colon screen ! refer today no sx Vit d better  b12 better  a1c is very good and now normal !! Continue lifestyle intervention healthy eating and exercise .  -Patient advised to return or notify health care team  if symptoms worsen or persist or new concerns arise.  Patient Instructions  Will arrange referral   For colonoscopy screening  Ok to refill the zofran as needed.   Ok to do protonix  For now. And as needed.  And on day stake indocin.      Neta Mends. Zyann Mabry M.D.

## 2012-12-28 ENCOUNTER — Encounter: Payer: Self-pay | Admitting: Internal Medicine

## 2012-12-28 DIAGNOSIS — Z1211 Encounter for screening for malignant neoplasm of colon: Secondary | ICD-10-CM | POA: Insufficient documentation

## 2012-12-28 DIAGNOSIS — K219 Gastro-esophageal reflux disease without esophagitis: Secondary | ICD-10-CM | POA: Insufficient documentation

## 2012-12-30 ENCOUNTER — Encounter: Payer: Self-pay | Admitting: Gastroenterology

## 2013-01-20 ENCOUNTER — Encounter (HOSPITAL_COMMUNITY): Payer: Self-pay

## 2013-01-20 ENCOUNTER — Encounter (HOSPITAL_COMMUNITY)
Admission: EM | Disposition: A | Discharge status: Home / Self Care | Payer: Self-pay | Source: Home / Self Care | Attending: Emergency Medicine

## 2013-01-20 ENCOUNTER — Emergency Department (HOSPITAL_COMMUNITY): Payer: Federal, State, Local not specified - PPO

## 2013-01-20 ENCOUNTER — Emergency Department (HOSPITAL_COMMUNITY): Payer: Federal, State, Local not specified - PPO | Admitting: Anesthesiology

## 2013-01-20 ENCOUNTER — Encounter (HOSPITAL_COMMUNITY): Payer: Self-pay | Admitting: Anesthesiology

## 2013-01-20 ENCOUNTER — Observation Stay (HOSPITAL_COMMUNITY)
Admission: EM | Admit: 2013-01-20 | Discharge: 2013-01-21 | Disposition: A | Discharge status: Home / Self Care | Payer: Federal, State, Local not specified - PPO | Attending: Surgery | Admitting: Surgery

## 2013-01-20 DIAGNOSIS — F329 Major depressive disorder, single episode, unspecified: Secondary | ICD-10-CM | POA: Insufficient documentation

## 2013-01-20 DIAGNOSIS — K219 Gastro-esophageal reflux disease without esophagitis: Secondary | ICD-10-CM | POA: Insufficient documentation

## 2013-01-20 DIAGNOSIS — K469 Unspecified abdominal hernia without obstruction or gangrene: Secondary | ICD-10-CM

## 2013-01-20 DIAGNOSIS — K458 Other specified abdominal hernia without obstruction or gangrene: Principal | ICD-10-CM | POA: Insufficient documentation

## 2013-01-20 DIAGNOSIS — Z9884 Bariatric surgery status: Secondary | ICD-10-CM | POA: Insufficient documentation

## 2013-01-20 DIAGNOSIS — K56609 Unspecified intestinal obstruction, unspecified as to partial versus complete obstruction: Secondary | ICD-10-CM

## 2013-01-20 DIAGNOSIS — E785 Hyperlipidemia, unspecified: Secondary | ICD-10-CM | POA: Insufficient documentation

## 2013-01-20 DIAGNOSIS — F3289 Other specified depressive episodes: Secondary | ICD-10-CM | POA: Insufficient documentation

## 2013-01-20 DIAGNOSIS — Z79899 Other long term (current) drug therapy: Secondary | ICD-10-CM | POA: Insufficient documentation

## 2013-01-20 DIAGNOSIS — F411 Generalized anxiety disorder: Secondary | ICD-10-CM | POA: Insufficient documentation

## 2013-01-20 HISTORY — PX: HIATAL HERNIA REPAIR: SHX195

## 2013-01-20 LAB — CBC WITH DIFFERENTIAL/PLATELET
Eosinophils Absolute: 0.1 10*3/uL (ref 0.0–0.7)
Eosinophils Relative: 2 % (ref 0–5)
HCT: 43.1 % (ref 39.0–52.0)
Hemoglobin: 14.8 g/dL (ref 13.0–17.0)
Lymphocytes Relative: 36 % (ref 12–46)
Lymphs Abs: 1.9 10*3/uL (ref 0.7–4.0)
MCH: 32.1 pg (ref 26.0–34.0)
MCV: 93.5 fL (ref 78.0–100.0)
Monocytes Relative: 7 % (ref 3–12)
Platelets: 154 10*3/uL (ref 150–400)
RBC: 4.61 MIL/uL (ref 4.22–5.81)
WBC: 5.4 10*3/uL (ref 4.0–10.5)

## 2013-01-20 LAB — POCT I-STAT, CHEM 8
BUN: 10 mg/dL (ref 6–23)
Calcium, Ion: 1.19 mmol/L (ref 1.12–1.23)
Chloride: 108 mEq/L (ref 96–112)
Creatinine, Ser: 1.1 mg/dL (ref 0.50–1.35)

## 2013-01-20 LAB — COMPREHENSIVE METABOLIC PANEL
ALT: 16 U/L (ref 0–53)
Alkaline Phosphatase: 85 U/L (ref 39–117)
BUN: 11 mg/dL (ref 6–23)
CO2: 26 mEq/L (ref 19–32)
Calcium: 10.2 mg/dL (ref 8.4–10.5)
GFR calc Af Amer: >90 mL/min (ref >90)
GFR calc non Af Amer: 80 mL/min — ABNORMAL LOW (ref >90)
Glucose, Bld: 131 mg/dL — ABNORMAL HIGH (ref 70–99)
Sodium: 143 mEq/L (ref 135–145)
Total Protein: 7.7 g/dL (ref 6.0–8.3)

## 2013-01-20 LAB — LIPASE, BLOOD: Lipase: 43 U/L (ref 11–59)

## 2013-01-20 SURGERY — REPAIR, HERNIA, HIATAL, LAPAROSCOPIC
Anesthesia: General | Site: Abdomen | Wound class: Clean Contaminated

## 2013-01-20 MED ORDER — PROMETHAZINE HCL 25 MG/ML IJ SOLN
INTRAMUSCULAR | Status: AC
  Filled 2013-01-20: qty 1

## 2013-01-20 MED ORDER — SODIUM CHLORIDE 0.9 % IV SOLN
INTRAVENOUS | Status: DC | PRN
  Administered 2013-01-20: 20:00:00 via INTRAVENOUS

## 2013-01-20 MED ORDER — MIDAZOLAM HCL 5 MG/5ML IJ SOLN
INTRAMUSCULAR | Status: DC | PRN
  Administered 2013-01-20: 2 mg via INTRAVENOUS

## 2013-01-20 MED ORDER — IOHEXOL 300 MG/ML  SOLN
100.0000 mL | Freq: Once | INTRAMUSCULAR | Status: AC | PRN
  Administered 2013-01-20: 100 mL via INTRAVENOUS

## 2013-01-20 MED ORDER — DEXTROSE 5 % IV SOLN
2.0000 g | INTRAVENOUS | Status: DC | PRN
  Administered 2013-01-20: 2 g via INTRAVENOUS

## 2013-01-20 MED ORDER — BUPIVACAINE HCL (PF) 0.25 % IJ SOLN
INTRAMUSCULAR | Status: AC
  Filled 2013-01-20: qty 60

## 2013-01-20 MED ORDER — CEFOXITIN SODIUM-DEXTROSE 1-4 GM-% IV SOLR (PREMIX)
INTRAVENOUS | Status: AC
  Filled 2013-01-20: qty 100

## 2013-01-20 MED ORDER — SODIUM CHLORIDE 0.9 % IV BOLUS (SEPSIS)
1000.0000 mL | Freq: Once | INTRAVENOUS | Status: AC
  Administered 2013-01-20: 1000 mL via INTRAVENOUS

## 2013-01-20 MED ORDER — BUPIVACAINE HCL 0.25 % IJ SOLN
INTRAMUSCULAR | Status: DC | PRN
  Administered 2013-01-20: 20 mL

## 2013-01-20 MED ORDER — NEOSTIGMINE METHYLSULFATE 1 MG/ML IJ SOLN
INTRAMUSCULAR | Status: DC | PRN
  Administered 2013-01-20: 4 mg via INTRAVENOUS

## 2013-01-20 MED ORDER — PROMETHAZINE HCL 25 MG/ML IJ SOLN
6.2500 mg | INTRAMUSCULAR | Status: DC | PRN
  Administered 2013-01-20: 6.25 mg via INTRAVENOUS

## 2013-01-20 MED ORDER — LACTATED RINGERS IV SOLN
INTRAVENOUS | Status: DC | PRN
  Administered 2013-01-20: 22:00:00 via INTRAVENOUS

## 2013-01-20 MED ORDER — ACETAMINOPHEN 10 MG/ML IV SOLN: 1000.0000 mg | Freq: Once | INTRAVENOUS | Status: AC | PRN

## 2013-01-20 MED ORDER — SODIUM CHLORIDE 0.9 % IV SOLN
Freq: Once | INTRAVENOUS | Status: AC
  Administered 2013-01-20: 17:00:00 via INTRAVENOUS

## 2013-01-20 MED ORDER — FENTANYL CITRATE 0.05 MG/ML IJ SOLN
INTRAMUSCULAR | Status: DC | PRN
  Administered 2013-01-20 (×2): 50 ug via INTRAVENOUS
  Administered 2013-01-20: 100 ug via INTRAVENOUS
  Administered 2013-01-20: 50 ug via INTRAVENOUS

## 2013-01-20 MED ORDER — ONDANSETRON HCL 4 MG/2ML IJ SOLN
4.0000 mg | Freq: Once | INTRAMUSCULAR | Status: AC
  Administered 2013-01-20: 4 mg via INTRAVENOUS
  Filled 2013-01-20: qty 2

## 2013-01-20 MED ORDER — LIDOCAINE HCL (PF) 2 % IJ SOLN
INTRAMUSCULAR | Status: DC | PRN
  Administered 2013-01-20: 50 mg

## 2013-01-20 MED ORDER — HYDROMORPHONE HCL PF 1 MG/ML IJ SOLN
0.2500 mg | INTRAMUSCULAR | Status: DC | PRN
  Administered 2013-01-20 (×2): 0.5 mg via INTRAVENOUS

## 2013-01-20 MED ORDER — 0.9 % SODIUM CHLORIDE (POUR BTL) OPTIME
TOPICAL | Status: DC | PRN
  Administered 2013-01-20: 1000 mL

## 2013-01-20 MED ORDER — ONDANSETRON HCL 4 MG/2ML IJ SOLN
INTRAMUSCULAR | Status: DC | PRN
  Administered 2013-01-20: 4 mg via INTRAVENOUS

## 2013-01-20 MED ORDER — ACETAMINOPHEN 10 MG/ML IV SOLN
INTRAVENOUS | Status: DC | PRN
  Administered 2013-01-20: 1000 mg via INTRAVENOUS

## 2013-01-20 MED ORDER — GLYCOPYRROLATE 0.2 MG/ML IJ SOLN
INTRAMUSCULAR | Status: DC | PRN
  Administered 2013-01-20: 0.6 mg via INTRAVENOUS

## 2013-01-20 MED ORDER — IOHEXOL 300 MG/ML  SOLN
50.0000 mL | Freq: Once | INTRAMUSCULAR | Status: AC | PRN
  Administered 2013-01-20: 50 mL via ORAL

## 2013-01-20 MED ORDER — HYDROMORPHONE HCL PF 1 MG/ML IJ SOLN
1.0000 mg | Freq: Once | INTRAMUSCULAR | Status: AC
  Administered 2013-01-20: 1 mg via INTRAVENOUS
  Filled 2013-01-20: qty 1

## 2013-01-20 MED ORDER — MEPERIDINE HCL 50 MG/ML IJ SOLN: 6.2500 mg | INTRAMUSCULAR | Status: DC | PRN

## 2013-01-20 MED ORDER — HYDROMORPHONE HCL PF 1 MG/ML IJ SOLN
INTRAMUSCULAR | Status: AC
  Filled 2013-01-20: qty 1

## 2013-01-20 MED ORDER — LACTATED RINGERS IV SOLN: INTRAVENOUS | Status: DC

## 2013-01-20 MED ORDER — ACETAMINOPHEN 10 MG/ML IV SOLN
INTRAVENOUS | Status: AC
  Filled 2013-01-20: qty 100

## 2013-01-20 MED ORDER — LACTATED RINGERS IR SOLN
Status: DC | PRN
  Administered 2013-01-20: 3000 mL

## 2013-01-20 MED ORDER — ROCURONIUM BROMIDE 100 MG/10ML IV SOLN
INTRAVENOUS | Status: DC | PRN
  Administered 2013-01-20: 10 mg via INTRAVENOUS
  Administered 2013-01-20: 40 mg via INTRAVENOUS

## 2013-01-20 MED ORDER — SUCCINYLCHOLINE CHLORIDE 20 MG/ML IJ SOLN
INTRAMUSCULAR | Status: DC | PRN
  Administered 2013-01-20: 100 mg via INTRAVENOUS

## 2013-01-20 MED ORDER — PROPOFOL 10 MG/ML IV BOLUS
INTRAVENOUS | Status: DC | PRN
  Administered 2013-01-20: 200 mg via INTRAVENOUS

## 2013-01-20 SURGICAL SUPPLY — 61 items
ADH SKN CLS APL DERMABOND .7 (GAUZE/BANDAGES/DRESSINGS) ×1
APL SKNCLS STERI-STRIP NONHPOA (GAUZE/BANDAGES/DRESSINGS)
APPLIER CLIP ROT 10 11.4 M/L (STAPLE)
APR CLP MED LRG 11.4X10 (STAPLE)
BENZOIN TINCTURE PRP APPL 2/3 (GAUZE/BANDAGES/DRESSINGS) ×1 IMPLANT
CANISTER SUCTION 2500CC (MISCELLANEOUS) ×1 IMPLANT
CANNULA ENDOPATH XCEL 11M (ENDOMECHANICALS) ×3 IMPLANT
CLAMP ENDO BABCK 10MM (STAPLE) ×2 IMPLANT
CLIP APPLIE ROT 10 11.4 M/L (STAPLE) ×1 IMPLANT
CLOTH BEACON ORANGE TIMEOUT ST (SAFETY) ×2 IMPLANT
DECANTER SPIKE VIAL GLASS SM (MISCELLANEOUS) ×1 IMPLANT
DERMABOND ADVANCED (GAUZE/BANDAGES/DRESSINGS) ×1
DERMABOND ADVANCED .7 DNX12 (GAUZE/BANDAGES/DRESSINGS) ×1 IMPLANT
DEVICE SUTURE ENDOST 10MM (ENDOMECHANICALS) IMPLANT
DISSECTOR BLUNT TIP ENDO 5MM (MISCELLANEOUS) ×1 IMPLANT
DRAIN PENROSE 18X1/2 LTX STRL (DRAIN) ×1 IMPLANT
DRAPE LAPAROSCOPIC ABDOMINAL (DRAPES) ×2 IMPLANT
ELECT REM PT RETURN 9FT ADLT (ELECTROSURGICAL) ×2
ELECTRODE REM PT RTRN 9FT ADLT (ELECTROSURGICAL) ×1 IMPLANT
GLOVE BIOGEL PI IND STRL 7.0 (GLOVE) ×1 IMPLANT
GLOVE BIOGEL PI IND STRL 7.5 (GLOVE) ×1 IMPLANT
GLOVE BIOGEL PI INDICATOR 7.0 (GLOVE)
GLOVE BIOGEL PI INDICATOR 7.5 (GLOVE) ×1
GLOVE SURG SIGNA 7.5 PF LTX (GLOVE) ×2 IMPLANT
GLOVE SURG SS PI 8.5 STRL IVOR (GLOVE) ×2
GLOVE SURG SS PI 8.5 STRL STRW (GLOVE) ×2 IMPLANT
GOWN STRL NON-REIN LRG LVL3 (GOWN DISPOSABLE) IMPLANT
GOWN STRL REIN 2XL XLG LVL4 (GOWN DISPOSABLE) ×1 IMPLANT
GOWN STRL REIN XL XLG (GOWN DISPOSABLE) ×4 IMPLANT
GRASPER ENDO BABCOCK 10 (MISCELLANEOUS) IMPLANT
GRASPER ENDO BABCOCK 10MM (MISCELLANEOUS)
IV LACTATED RINGER IRRG 3000ML (IV SOLUTION) ×2
IV LR IRRIG 3000ML ARTHROMATIC (IV SOLUTION) IMPLANT
KIT BASIN OR (CUSTOM PROCEDURE TRAY) ×2 IMPLANT
NS IRRIG 1000ML POUR BTL (IV SOLUTION) ×3 IMPLANT
PENCIL BUTTON HOLSTER BLD 10FT (ELECTRODE) IMPLANT
RETRACTOR LAPSCP 12X46 CVD (ENDOMECHANICALS) IMPLANT
RTRCTR LAPSCP 12X46 CVD (ENDOMECHANICALS)
SCALPEL HARMONIC ACE (MISCELLANEOUS) ×1 IMPLANT
SET IRRIG TUBING LAPAROSCOPIC (IRRIGATION / IRRIGATOR) ×2 IMPLANT
SLEEVE XCEL OPT CAN 5 100 (ENDOMECHANICALS) ×3 IMPLANT
SOLUTION ANTI FOG 6CC (MISCELLANEOUS) ×2 IMPLANT
STAPLER VISISTAT 35W (STAPLE) ×2 IMPLANT
STRIP CLOSURE SKIN 1/2X4 (GAUZE/BANDAGES/DRESSINGS) IMPLANT
SUT MON AB 5-0 PS2 18 (SUTURE) ×1 IMPLANT
SUT SILK 2 0 SH (SUTURE) ×6 IMPLANT
SUT SURGIDAC NAB ES-9 0 48 120 (SUTURE) IMPLANT
SUT VIC AB 4-0 SH 18 (SUTURE) ×1 IMPLANT
SUT VICRYL 0 TIES 12 18 (SUTURE) IMPLANT
TIP INNERVISION DETACH 40FR (MISCELLANEOUS) IMPLANT
TIP INNERVISION DETACH 50FR (MISCELLANEOUS) IMPLANT
TIP INNERVISION DETACH 56FR (MISCELLANEOUS) IMPLANT
TIPS INNERVISION DETACH 40FR (MISCELLANEOUS)
TOWEL OR 17X26 10 PK STRL BLUE (TOWEL DISPOSABLE) ×4 IMPLANT
TRAY FOLEY CATH 14FRSI W/METER (CATHETERS) IMPLANT
TRAY LAP CHOLE (CUSTOM PROCEDURE TRAY) ×2 IMPLANT
TROCAR BLADELESS OPT 5 100 (ENDOMECHANICALS) ×1 IMPLANT
TROCAR BLADELESS OPT 5 75 (ENDOMECHANICALS) IMPLANT
TROCAR XCEL 12X100 BLDLESS (ENDOMECHANICALS) IMPLANT
TROCAR XCEL NON-BLD 11X100MML (ENDOMECHANICALS) IMPLANT
TUBING INSUFFLATION 10FT LAP (TUBING) ×2 IMPLANT

## 2013-01-20 NOTE — ED Notes (Signed)
MD at bedside. Surgeon at bedside.  

## 2013-01-20 NOTE — Transfer of Care (Signed)
Immediate Anesthesia Transfer of Care Note  Patient: Hector [name]  Procedure(s) Performed: Procedure(s): LAPAROSCOPIC REPAIR OF INTERNAL HERNIA (N/A)  Patient Location: PACU  Anesthesia Type:General  Level of Consciousness: awake, alert  and oriented  Airway & Oxygen Therapy: Patient Spontanous Breathing and Patient connected to face mask oxygen  Post-op Assessment: Report given to PACU RN and Post -op Vital signs reviewed and stable  Post vital signs: Reviewed and stable  Complications: No apparent anesthesia complications

## 2013-01-20 NOTE — ED Notes (Signed)
Pt c/o severe abdominal pain.  Pain score 7/10.  Hx gastric bypass 10/2009 and perforated bowel 05/2010.  Sts "it feels like it did when it was perforated."  Vitals are stable.  A & Ox4.

## 2013-01-20 NOTE — Anesthesia Preprocedure Evaluation (Addendum)
Anesthesia Evaluation  Patient identified by MRN, date of birth, ID band Patient awake    Reviewed: Allergy & Precautions, H&P , NPO status , Patient's Chart, lab work & pertinent test results  Airway Mallampati: II TM Distance: >3 FB Neck ROM: Full    Dental  (+) Dental Advisory Given and Teeth Intact   Pulmonary neg pulmonary ROS, asthma , sleep apnea ,  breath sounds clear to auscultation        Cardiovascular negative cardio ROS  Rhythm:Regular Rate:Normal     Neuro/Psych  Headaches, PSYCHIATRIC DISORDERS Depression    GI/Hepatic Neg liver ROS, PUD, GERD-  Medicated,  Endo/Other  diabetes, Type 2, Oral Hypoglycemic Agents  Renal/GU negative Renal ROS     Musculoskeletal negative musculoskeletal ROS (+)   Abdominal   Peds  Hematology negative hematology ROS (+)   Anesthesia Other Findings   Reproductive/Obstetrics                          Anesthesia Physical Anesthesia Plan  ASA: II  Anesthesia Plan: General   Post-op Pain Management:    Induction: Intravenous and Rapid sequence  Airway Management Planned: Oral ETT  Additional Equipment:   Intra-op Plan:   Post-operative Plan: Extubation in OR  Informed Consent: I have reviewed the patients History and Physical, chart, labs and discussed the procedure including the risks, benefits and alternatives for the proposed anesthesia with the patient or authorized representative who has indicated his/her understanding and acceptance.   Dental advisory given  Plan Discussed with: CRNA  Anesthesia Plan Comments:         Anesthesia Quick Evaluation

## 2013-01-20 NOTE — ED Notes (Signed)
Pt resting quietly. Pt to go to OR per Dr. Ezzard Standing. Dr. Ezzard Standing at bedside speaking with pt and pt's family. Pt states pain is controlled. Pt denies n/v at present. VSS.

## 2013-01-20 NOTE — H&P (Addendum)
Re:   Hector [name] DOB:   06-Jun-1961 MRN:   [id number]  ASSESSMENT AND PLAN: 1.  Acute abdominal pain  Possible SBO - internal hernia vs adhesions vs non mechanical  I explained to the patient and hsi wife about laparoscopic exploration. The risk of surgery include, but are not limited to, infection, bleeding, open surgery, bowel resection, continued pain.   2.  History of RYGB - D. Ezzard Standing 11/06/2009  I last saw him around 09/2010. 3.  Diabetes mellitus - resolved with weight 4.  Gout  No further since weight loss 5.  Sleep apnea - resolved 6.  Perforated ulcer  Repaired laparoscopically - M. Daphine Deutscher -  06/08/2010 7. History of kidney stones 8.  History of anxiety  Chief Complaint  Patient presents with  . Abdominal Pain   REFERRING PHYSICIAN:  Dr. Eber Hong, [facility]  HISTORY OF PRESENT ILLNESS: Hector [name] is a 52 y.o. (DOB: 12-31-1960)  white  male whose primary care physician is Lorretta Harp, MD and comes to the [facility] today for abdominal pain.  He was doing well and leaving [facility] where his father is a patient for cardiac disease.  He aite a burger and fries and developed LUQ abdominal pain that became severe.  It was different than the perforation pain that he had in 2011.  He did not feel nauseated or vomited.  He is a little better, but he has had pain meds.  He has no history of liver disease, gall bladder disease, or colon disease.  He did have an abdominal wall hernia repaired by Dr. Maryagnes Amos which predated his RYGB.  This was an onlay mesh.  He has been on Protonix since Feb when he had a workup for cardiac disease that proved negative. CT scan, 12/21/2012, suggest SBO with bowel in RUQ.  Reviewed case with Dr. Jacqualine Code.    Past Medical History  Diagnosis Date  . Hyperlipidemia   . Abnormal LFTs     improved after gastic bypass  . History of concussion   . Gout   . Perforated ulcer     Surgical repair9/24 2011  . B12 nutritional deficiency   . Closed head  injury     laceration  9 12  no loc  vision change  . History of renal stone     Seen in emergency room not urologist  . Sleep apnea     had gastric bypass/ sa-gone  . Diabetes mellitus     resolved with gastric bypass  . Chest pain     hopsitalized 2 14 evaluation  . OBESITY 12/10/2007    Qualifier: Diagnosis of  By: Fabian Sharp MD, Neta Mends Gastric bypass   . 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       Past Surgical History  Procedure Laterality Date  . Vasectomy    . Hernia repair      umbilical   . Nasal septoplasty w/ turbinoplasty      x 2  . Gastric bypass  11/06/09    beginning weight 267  . Epidural block injection      by Dr. Ethelene Hal  . Surgerical repair of stomach ulcer  06/08/10    per  . Back surgery      Dr. Shelle Iron      No current facility-administered medications for this encounter.  Current Outpatient Prescriptions  Medication Sig Dispense Refill  . albuterol (PROVENTIL,VENTOLIN) 90 MCG/ACT inhaler Inhale 2 puffs into the lungs every 6 (six) hours as needed. For shortness of breath.      [name] amitriptyline (ELAVIL) 50 MG tablet Take 50 mg by mouth at bedtime as needed for sleep.      . B Complex-C (B-COMPLEX WITH VITAMIN C) tablet Take 1 tablet by mouth daily.      . calcium citrate (CALCITRATE) 950 MG tablet Take 1 tablet by mouth daily.       . Cholecalciferol 10000 UNITS CAPS Take 1 capsule by mouth daily.      [name] escitalopram (LEXAPRO) 20 MG tablet Take 20 mg by mouth daily.      . ferrous sulfate 325 (65 FE) MG tablet Take 325 mg by mouth daily with breakfast.      . lamoTRIgine (LAMICTAL) 200 MG tablet Take 200 mg by mouth daily.      . methocarbamol (ROBAXIN) 750 MG tablet Take 750 mg by mouth 4 (four) times daily as needed. Back pain.      . Multiple Vitamin (MULTIVITAMIN WITH MINERALS) TABS Take 1 tablet by mouth daily.      .  pantoprazole (PROTONIX) 40 MG tablet Take 1 tablet (40 mg total) by mouth daily.  30 tablet  5  . tamsulosin (FLOMAX) 0.4 MG CAPS Take 0.4 mg by mouth daily.      [name] zolpidem (AMBIEN) 10 MG tablet Take 10 mg by mouth at bedtime as needed. For sleep          Allergies  Allergen Reactions  . Aspirin     REACTION: Pt had a allergic reaction as a perforated ulcer  . Hydrocodone Other (See Comments)    headache    REVIEW OF SYSTEMS: Skin:  No history of rash.  No history of abnormal moles. Infection:  No history of hepatitis or HIV.  No history of MRSA. Neurologic:  No history of stroke.  No history of seizure.  No history of headaches. Cardiac:  No history of hypertension. No history of heart disease. Chest pain evaluated in Feb 2014 - which had a neg workup.  He does not remember the cardiologist that saw him. Pulmonary:  Sleep apnea - resolved with weight loss.  Endocrine:  Diabetes resolved.  No thyroid disease. Gastrointestinal:   No history of liver disease.  No history of gall bladder disease.  No history of pancreas disease.  No history of colon disease. Urologic:  History of kidney stones.  Musculoskeletal:  No history of joint or back disease. Hematologic:  No bleeding disorder.  No history of anemia.  Not anticoagulated. Psycho-social:  The patient is oriented.   History of depression and anxiety. SOCIAL and FAMILY HISTORY: Married. Works for [employer]  PHYSICAL EXAM: BP 155/104  Pulse 73  Temp(Src) 98.4 F (36.9 C) (Oral)  Resp 16  SpO2 98%  General: WN WM who is alert.  HEENT: Normal. Pupils equal. Neck: Supple. No mass.  No thyroid mass. Lymph Nodes:  No supraclavicular or cervical nodes. Lungs: Clear to auscultation and symmetric breath sounds. Heart:  RRR. No murmur or rub.  Abdomen: Soft. No mass. Mild to moderate LUQ tenderness.   Normal bowel sounds.  Transverse abdominal scar at umbilicus. Rectal: Not done. Extremities:  Good  strength and ROM  in upper and lower extremities. Neurologic:  Grossly intact to motor and sensory function. Psychiatric: Has normal mood and affect.  Behavior is normal.   DATA REVIEWED: CT scan and labs.  Ovidio Kin, MD,  [facility] [address]  [address]   [place], [place]    [address] Phone:  [phone] FAX:  [phone]

## 2013-01-20 NOTE — ED Provider Notes (Signed)
History     CSN: [id number]  Arrival date & time 01/20/13  1514   First MD Initiated Contact with Patient 01/20/13 1516      Chief Complaint  Patient presents with  . Abdominal Pain    (Consider location/radiation/quality/duration/timing/severity/associated sxs/prior treatment) HPI Comments: 53 year old male with a history of gastric bypass surgery as well as perforated stomach ulcer who presents with abdominal pain which was acute in onset and severe and occurred just prior to arrival. This pain is located in the epigastrium but seems to be spreading to complete abdomen and is associated with nausea but no vomiting. He has no diarrhea, normal bowel movements, no blood in the stools. He has had no medications for this prior to arrival, pain is severe and persistent.  The history is provided by the patient.    Past Medical History  Diagnosis Date  . Hyperlipidemia   . Abnormal LFTs     improved after gastic bypass  . History of concussion   . Gout   . Perforated ulcer     Surgical repair9/24 2011  . B12 nutritional deficiency   . Closed head injury     laceration  9 12  no loc  vision change  . History of renal stone     Seen in emergency room not urologist  . Sleep apnea     had gastric bypass/ sa-gone  . Diabetes mellitus     resolved with gastric bypass  . Chest pain     hopsitalized 2 14 evaluation  . OBESITY 12/10/2007    Qualifier: Diagnosis of  By: Fabian Sharp MD, Neta Mends Gastric bypass   . 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     Past Surgical History  Procedure Laterality Date  . Vasectomy    . Hernia repair      umbilical   . Nasal septoplasty w/ turbinoplasty      x 2  . Gastric bypass  11/06/09    beginning weight 267  . Epidural block injection      by Dr. Ethelene Hal  . Surgerical repair of stomach ulcer  06/08/10    per  . Back surgery       Dr. Shelle Iron    Family History  Problem Relation Age of Onset  . Uterine cancer Mother   . Hypertension Mother   . Cervical cancer Mother   . Heart disease Father   . COPD Father     History  Substance Use Topics  . Smoking status: Never Smoker   . Smokeless tobacco: Never Used  . Alcohol Use: No     Comment: socially      Review of Systems  All other systems reviewed and are negative.    Allergies  Aspirin and Hydrocodone  Home Medications   Current Outpatient Rx  Name  Route  Sig  Dispense  Refill  . albuterol (PROVENTIL,VENTOLIN) 90 MCG/ACT inhaler   Inhalation   Inhale 2 puffs into the lungs every 6 (six) hours as needed. For shortness of breath.         [name] amitriptyline (ELAVIL) 50 MG tablet   Oral   Take 50 mg by mouth at bedtime as needed for sleep.         . B Complex-C (B-COMPLEX WITH VITAMIN C) tablet   Oral   Take 1 tablet by  mouth daily.         . calcium citrate (CALCITRATE) 950 MG tablet   Oral   Take 1 tablet by mouth daily.          . Cholecalciferol 10000 UNITS CAPS   Oral   Take 1 capsule by mouth daily.         [name] escitalopram (LEXAPRO) 20 MG tablet   Oral   Take 20 mg by mouth daily.         . ferrous sulfate 325 (65 FE) MG tablet   Oral   Take 325 mg by mouth daily with breakfast.         . lamoTRIgine (LAMICTAL) 200 MG tablet   Oral   Take 200 mg by mouth daily.         . methocarbamol (ROBAXIN) 750 MG tablet   Oral   Take 750 mg by mouth 4 (four) times daily as needed. Back pain.         . Multiple Vitamin (MULTIVITAMIN WITH MINERALS) TABS   Oral   Take 1 tablet by mouth daily.         . pantoprazole (PROTONIX) 40 MG tablet   Oral   Take 1 tablet (40 mg total) by mouth daily.   30 tablet   5   . tamsulosin (FLOMAX) 0.4 MG CAPS   Oral   Take 0.4 mg by mouth daily.         [name] zolpidem (AMBIEN) 10 MG tablet   Oral   Take 10 mg by mouth at bedtime as needed. For sleep           BP  155/104  Pulse 73  Temp(Src) 98.4 F (36.9 C) (Oral)  Resp 16  SpO2 98%  Physical Exam  Nursing note and vitals reviewed. Constitutional: He appears well-developed and well-nourished. He appears distressed.  HENT:  Head: Normocephalic and atraumatic.  Mouth/Throat: Oropharynx is clear and moist. No oropharyngeal exudate.  Eyes: Conjunctivae and EOM are normal. Pupils are equal, round, and reactive to light. Right eye exhibits no discharge. Left eye exhibits no discharge. No scleral icterus.  Neck: Normal range of motion. Neck supple. No JVD present. No thyromegaly present.  Cardiovascular: Normal rate, regular rhythm, normal heart sounds and intact distal pulses.  Exam reveals no gallop and no friction rub.   No murmur heard. Pulmonary/Chest: Effort normal and breath sounds normal. No respiratory distress. He has no wheezes. He has no rales.  Abdominal: Soft. Bowel sounds are normal. He exhibits no distension and no mass. There is tenderness ( Significant epigastric tenderness with mild guarding but no peritoneal signs, no lower abdominal tenderness, soft abdomen no rigidity).  Musculoskeletal: Normal range of motion. He exhibits no edema and no tenderness.  Lymphadenopathy:    He has no cervical adenopathy.  Neurological: He is alert. Coordination normal.  Skin: Skin is warm and dry. No rash noted. No erythema.  Psychiatric: He has a normal mood and affect. His behavior is normal.    ED Course  Procedures (including critical care time)  Labs Reviewed  COMPREHENSIVE METABOLIC PANEL - Abnormal; Notable for the following:    Glucose, Bld 131 (*)    GFR calc non Af Amer 80 (*)    All other components within normal limits  POCT I-STAT, CHEM 8 - Abnormal; Notable for the following:    Potassium 7.0 (*)    Glucose, Bld 133 (*)    Calcium, Ion 1.07 (*)  All other components within normal limits  CBC WITH DIFFERENTIAL  LIPASE, BLOOD  POCT I-STAT, CHEM 8   Dg Chest 2  View  01/20/2013  *RADIOLOGY REPORT*  Clinical Data: 2 hour history of chest pain and abdominal pain. Current history of diabetes.  CHEST - 2 VIEW  Comparison: Two-view chest x-ray 10/19/2012, 10/16/2011, 08/29/2009.  Findings: Cardiac silhouette normal in size, unchanged.  Thoracic and mildly tortuous, unchanged.  Hilar and mediastinal contours otherwise unremarkable.  Lungs clear.  Bronchovascular markings normal.  Pulmonary vascularity normal.  No pneumothorax.  No pleural effusions.  Degenerative changes involving the thoracic spine.  No significant interval change.  IMPRESSION: No acute cardiopulmonary disease.  Stable examination.   Original Report Authenticated By: Hulan Saas, M.D.    Ct Abdomen Pelvis W Contrast  01/20/2013  *RADIOLOGY REPORT*  Clinical Data: Abdominal pain.  Status post gastric bypass.  CT ABDOMEN AND PELVIS WITH CONTRAST  Technique:  Multidetector CT imaging of the abdomen and pelvis was performed following the standard protocol during bolus administration of intravenous contrast.  Contrast: 50mL OMNIPAQUE IOHEXOL 300 MG/ML  SOLN, OMNIPAQUE IOHEXOL 300 MG/ML  SOLN  Comparison: 09/10/2013  Findings: Lung bases are clear without pleural effusions.  No evidence for free air.  Normal appearance of the liver, gallbladder and portal venous system.  Normal appearance of the spleen, pancreas and adrenal glands.  Nonobstructive 3 mm stone in the right kidney lower pole.  5 mm stone in the lower pole of the left kidney.  Postsurgical changes consistent with a gastric bypass procedure. There are dilated loops of small bowel in the upper abdomen near the gastrojejunostomy.  Dilated loops of small bowel are also present in the left upper abdomen. The dilated loops appear to represent the Roux limb.  In addition, there is mesenteric edema and fluid adjacent to the small bowel loops in the left upper quadrant on image 34.  There is concern for a transition point on image number 42, sequence  2 and image 36, sequence #3.  There is mild mesenteric edema and prominent lymph nodes in the anterior abdominal mesentery.  No gross abnormality to the prostate, seminal vesicles and urinary bladder.  Large amount of stool in the rectum.  There are bilateral parapelvic cysts without hydronephrosis.  No acute bony abnormality.  IMPRESSION: Post gastric bypass surgery.  Dilated loops of small bowel in the left upper abdomen appear to represent the Roux limb.  There is mesenteric edema and fluid around the dilated loops of small bowel. There is evidence for a transition point.  Findings are concerning for a small bowel obstruction.  Nonobstructive bilateral kidney stones.  These results were called by telephone on 01/20/2013 at 5:28 p.m. to Dr. Hyacinth Meeker, who verbally acknowledged these results.   Original Report Authenticated By: Richarda Overlie, M.D.      1. Small bowel obstruction       MDM  The patient is not diaphoretic, he does have severe upper abdominal pain which was acute in onset. I will assume this is a perforated ulcer until proven otherwise, upright chest x-ray to evaluate for free air, labs, pain medications.  I have personally interpretted the CXR PA and Lateral views and there is no sign of free air, and the lungs appear normal, normal heart shadow and mediastinum.  4:00 PM - on repeat exam he has improved pain and feels much better - ongoing upper abd pain.  No signs of perforation on xray - CT scan ordered to  evaluate for other sources of post Gastric BP pain.  Initial K was elevated - redrawn to evaluate for hypekalemia.  Care discussed with Dr. Ezzard Standing who will admit the patient to the hospital. Small bowel obstruction seen on CT scan. Labs otherwise unremarkable, potassium rechecked and in a normal range.  \ED ECG REPORT  I personally interpreted this EKG   Date: 01/20/2013   Rate: 66  Rhythm: normal sinus rhythm  QRS Axis: normal  Intervals: normal  ST/T Wave abnormalities: normal   Conduction Disutrbances:none  Narrative Interpretation:   Old EKG Reviewed: Compared with 10/20/2012, no significant change   Vida Roller, MD 01/20/13 1814

## 2013-01-20 NOTE — ED Notes (Signed)
Patient transported to X-ray 

## 2013-01-20 NOTE — ED Notes (Signed)
Hyacinth Meeker EDP notified of I-Stat results.

## 2013-01-21 ENCOUNTER — Encounter (HOSPITAL_COMMUNITY): Payer: Self-pay | Admitting: Surgery

## 2013-01-21 MED ORDER — LAMOTRIGINE 200 MG PO TABS
200.0000 mg | ORAL_TABLET | Freq: Every day | ORAL | Status: DC
  Administered 2013-01-21: 200 mg via ORAL
  Filled 2013-01-21: qty 1

## 2013-01-21 MED ORDER — ONDANSETRON HCL 4 MG PO TABS: 4.0000 mg | ORAL_TABLET | Freq: Four times a day (QID) | ORAL | Status: DC | PRN

## 2013-01-21 MED ORDER — HEPARIN SODIUM (PORCINE) 5000 UNIT/ML IJ SOLN
5000.0000 [IU] | Freq: Three times a day (TID) | INTRAMUSCULAR | Status: DC
  Administered 2013-01-21: 5000 [IU] via SUBCUTANEOUS
  Filled 2013-01-21 (×4): qty 1

## 2013-01-21 MED ORDER — OXYCODONE HCL 5 MG PO TABS: 5.0000 mg | ORAL_TABLET | ORAL | Status: DC | PRN

## 2013-01-21 MED ORDER — AMITRIPTYLINE HCL 50 MG PO TABS
50.0000 mg | ORAL_TABLET | Freq: Every evening | ORAL | Status: DC | PRN
  Filled 2013-01-21: qty 1

## 2013-01-21 MED ORDER — ESCITALOPRAM OXALATE 20 MG PO TABS
20.0000 mg | ORAL_TABLET | Freq: Every day | ORAL | Status: DC
  Administered 2013-01-21: 20 mg via ORAL
  Filled 2013-01-21: qty 1

## 2013-01-21 MED ORDER — ONDANSETRON HCL 4 MG/2ML IJ SOLN
4.0000 mg | Freq: Four times a day (QID) | INTRAMUSCULAR | Status: DC | PRN
  Administered 2013-01-21 (×2): 4 mg via INTRAVENOUS
  Filled 2013-01-21 (×2): qty 2

## 2013-01-21 MED ORDER — PANTOPRAZOLE SODIUM 40 MG PO TBEC
40.0000 mg | DELAYED_RELEASE_TABLET | Freq: Every day | ORAL | Status: DC
  Administered 2013-01-21: 40 mg via ORAL
  Filled 2013-01-21: qty 1

## 2013-01-21 MED ORDER — POTASSIUM CHLORIDE IN NACL 20-0.45 MEQ/L-% IV SOLN
INTRAVENOUS | Status: DC
  Administered 2013-01-21: 02:00:00 via INTRAVENOUS
  Filled 2013-01-21 (×3): qty 1000

## 2013-01-21 MED ORDER — OXYCODONE-ACETAMINOPHEN 5-325 MG PO TABS
1.0000 | ORAL_TABLET | ORAL | Status: DC | PRN
  Administered 2013-01-21 (×3): 2 via ORAL
  Filled 2013-01-21 (×3): qty 2

## 2013-01-21 MED ORDER — MORPHINE SULFATE 2 MG/ML IJ SOLN
1.0000 mg | INTRAMUSCULAR | Status: DC | PRN
  Administered 2013-01-21: 2 mg via INTRAVENOUS
  Filled 2013-01-21 (×2): qty 1

## 2013-01-21 NOTE — Discharge Summary (Signed)
General surgery attending note:  I have interviewed and examined this patient this afternoon. He feels fine and is getting ready to go home. I agree with the assessment, treatment plan, and outpatient followup plans outlined by Ms. Osborne. He will see Dr. Ezzard Standing in 3-4 weeks.  Angelia Mould. Derrell Lolling, M.D., [facility] General and Minimally invasive Surgery Breast and Colorectal Surgery Office:   [phone] Pager:   [phone]

## 2013-01-21 NOTE — Op Note (Signed)
NAME:  Hector [name], Hector [name] NO.:  [id number]  MEDICAL RECORD NO.:  [id number]  LOCATION:  1528                         FACILITY:  [facility]  PHYSICIAN:  Sandria Bales. Ezzard Standing, M.D.  DATE OF BIRTH:  [birth year]  DATE OF PROCEDURE:  01/20/2013                              OPERATIVE REPORT  PREOPERATIVE DIAGNOSIS:  Small bowel obstruction, status post Roux-en-Y gastric bypass.  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  PROCEDURE:  Reduction of small bowel obstruction with repair of defectof jejunal-jejunal anastomosis.  SURGEON:  Sandria Bales. Ezzard Standing, MD  FIRST ASSISTANT:  None.  ANESTHESIA:  General.  ESTIMATED BLOOD LOSS:  Minimal.  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  He has successfully lost about 100 pounds.  Unfortunately, about 7 months postop, he had a perforated ulcer repair laparoscopically by Dr. Luretha Murphy in September of 2011.  However since that time, he has done very well with no further GI complaints. Then earlier today he developed severe abdominal pain after eating a burger and fries.  He came to the [facility] and an abdominal CT scan was consistent with a partial bowel obstruction.  I discussed with the patient and his wife about proceeding with laparoscopic exploratory surgery.  The risks of surgery include bleeding, infection, and the need for bowel resection, and open surgery.  OPERATIVE NOTE:  The patient was taken to room #1 at [facility], underwent a general endotracheal anesthesia.    A time-out was held, and surgical checklist run.    He was given 2 g of cefoxitin at the initiation of the procedure.  A time-out was held and surgical checklist run.  I accessed the abdominal cavity through the right upper quadrant with a 5-mm Ethicon Optiview trocar.  I used 3  additional 5 mm trocars: one in the right lower quadrant, one in the left lower quadrant, one in the left upper quadrant, and carried out exploration.    Right and left lobes of liver were unremarkable.  There were basically no adhesios.  The bowel was dilated in the left upper quadrant.  I tried to run from the gastrojejunal anastomosis down, but could not advance the exploration beyond the dilated bowel.  So I started running the small bowel at the terminal ileum.   Within about 20 cm of terminal ileum, the small bowel Had slipped through a defect at the jejunal-jejunal anastomosis.  I reduced all this.  It was this twist that caused more proximal obstruction.  The distal small bowel was decompressed.  After I got the bowel reduced, I was able to run the bowel of the proximal Roux-en-Y to the gastrojejunal anastomosis.  He has a normal candy-cane loop with a long blind pouch end.    I ran the entire bowel a second time to make sure he did not have something else.  But I thought I had reduced the bowel from the hernia defect.  He had a 3 to 4 cm defect in the mesentery at the JJ.  There was  an old silk stitch at the mesenteric defect, which was kind of a band across it that I cut.  I closed this jejunal-jejunal defect with 5 interrupted 2-0 silk sutures.  I then inspected the mesentery and bowel.  I could not see any obvious defect.  I took photos of this and placed these in the chart.  He did have a little bit of fluid in the left upper quadrant, but no bowel was compromised.  The trocars were removed in turn.  There has been no bleeding at the trocar site.  The skin was closed with a 5-0 Monocryl suture.  I infiltrated about 20 mL of 0.25% Marcaine at the 4 incisions and then painted the wounds with Dermabond.  Sponge and needle count were correct at the end of the case.  He tolerated the procedure well, was transferred to the recovery in good condition.   Sandria Bales. Ezzard Standing, M.D.,  FACS   DHN/MEDQ  D:  01/20/2013  T:  01/21/2013  Job:  [id number]  cc:   Hector Mends. Fabian Sharp, MD

## 2013-01-21 NOTE — Progress Notes (Signed)
Report to jess rn, abd dsgs x 4 remain clean dry and intact

## 2013-01-21 NOTE — Care Management Note (Signed)
    Page 1 of 1   01/21/2013     10:36:52 AM   CARE MANAGEMENT NOTE 01/21/2013  Patient:  Hector [name], Hector [name]   Account Number:  [id number]  Date Initiated:  01/21/2013  Documentation initiated by:  Lorenda Ishihara  Subjective/Objective Assessment:   52 yo male admitted s/p laparoscopy for release of bowel obstruction. PTA lived at home with spouse.     Action/Plan:   Home when stable   Anticipated DC Date:  01/21/2013   Anticipated DC Plan:  HOME/SELF CARE      DC Planning Services  CM consult      Choice offered to / List presented to:             Status of service:  Completed, signed off Medicare Important Message given?   (If response is "NO", the following Medicare IM given date fields will be blank) Date Medicare IM given:   Date Additional Medicare IM given:    Discharge Disposition:  HOME/SELF CARE  Per UR Regulation:  Reviewed for med. necessity/level of care/duration of stay  If discussed at Long Length of Stay Meetings, dates discussed:    Comments:

## 2013-01-21 NOTE — Anesthesia Postprocedure Evaluation (Signed)
Anesthesia Post Note  Patient: Hector [name]  Procedure(s) Performed: Procedure(s) (LRB): LAPAROSCOPIC REPAIR OF INTERNAL HERNIA (N/A)  Anesthesia type: General  Patient location: PACU  Post pain: Pain level controlled  Post assessment: Post-op Vital signs reviewed  Last Vitals: BP 136/82  Pulse 66  Temp(Src) 36.6 C (Oral)  Resp 13  Ht 5\' 10"  (1.778 m)  Wt 187 lb (84.823 kg)  BMI 26.83 kg/m2  SpO2 97%  Post vital signs: Reviewed  Level of consciousness: sedated  Complications: No apparent anesthesia complications

## 2013-01-21 NOTE — Discharge Summary (Signed)
Patient ID: CLEAVON GOLDMAN MRN: [id number] DOB/AGE: [birth year] 51 y.o.  Admit date: 01/20/2013 Discharge date: 01/21/2013  Procedures: laparoscopic reduction of small bowel obstruction with repair of jejunal-jejunal anastomosis  Consults: None  Reason for Admission: Hector [name] is a 52 y.o. (DOB: 16-Jun-1961) white male whose primary care physician is Lorretta Harp, MD and comes to the [facility] today for abdominal pain.  He was doing well and leaving [facility] where his father is a patient for cardiac disease. He aite a burger and fries and developed LUQ abdominal pain that became severe. It was different than the perforation pain that he had in 2011. He did not feel nauseated or vomited. He is a little better, but he has had pain meds. He has no history of liver disease, gall bladder disease, or colon disease. He did have an abdominal wall hernia repaired by Dr. Maryagnes Amos which predated his RYGB. This was an onlay mesh.  He has been on Protonix since Feb when he had a workup for cardiac disease that proved negative.  CT scan, 12/21/2012, suggest SBO with bowel in RUQ. Reviewed case with Dr. Jacqualine Code.   Admission Diagnoses:  1. Acute abdominal pain 2. H/o RYGB 3. DM 4. OSA  Hospital Course: The patient was admitted and taken to the operating room where he was found to have an internal hernia secondary to his RYGB surgery.  This was repaired.  The patient tolerated the procedure well.  He was sore on POD1 as expected, but was able to tolerated a diet and oral pain medication.  He was stable for dc home.  PE: Abd: soft, appropriately tender, +BS, ND, incisions c/d/i with dermabond  Discharge Diagnoses:  1. SBO secondary to internal hernia 2. S/p RYGB 3. DM  Discharge Medications:   Medication List    TAKE these medications       albuterol 90 MCG/ACT inhaler  Commonly known as:  PROVENTIL,VENTOLIN  Inhale 2 puffs into the lungs every 6 (six) hours as needed. For shortness of breath.      amitriptyline 50 MG tablet  Commonly known as:  ELAVIL  Take 50 mg by mouth at bedtime as needed for sleep.     B-complex with vitamin C tablet  Take 1 tablet by mouth daily.     calcium citrate 950 MG tablet  Commonly known as:  CALCITRATE - dosed in mg elemental calcium  Take 1 tablet by mouth daily.     Cholecalciferol 10000 UNITS Caps  Take 1 capsule by mouth daily.     escitalopram 20 MG tablet  Commonly known as:  LEXAPRO  Take 20 mg by mouth daily.     ferrous sulfate 325 (65 FE) MG tablet  Take 325 mg by mouth daily with breakfast.     lamoTRIgine 200 MG tablet  Commonly known as:  LAMICTAL  Take 200 mg by mouth daily.     methocarbamol 750 MG tablet  Commonly known as:  ROBAXIN  Take 750 mg by mouth 4 (four) times daily as needed. Back pain.     multivitamin with minerals Tabs  Take 1 tablet by mouth daily.     oxyCODONE 5 MG immediate release tablet  Commonly known as:  ROXICODONE  Take 1-2 tablets (5-10 mg total) by mouth every 4 (four) hours as needed for pain.     pantoprazole 40 MG tablet  Commonly known as:  PROTONIX  Take 1 tablet (40 mg total) by mouth daily.  tamsulosin 0.4 MG Caps  Commonly known as:  FLOMAX  Take 0.4 mg by mouth daily.     zolpidem 10 MG tablet  Commonly known as:  AMBIEN  Take 10 mg by mouth at bedtime as needed. For sleep        Discharge Instructions:     Follow-up Information   Follow up with NEWMAN,DAVID H, MD. Schedule an appointment as soon as possible for a visit in 3 weeks.   Contact information:   [address] [place] [address] [phone]       Signed: Letha Cape 01/21/2013, 10:35 AM

## 2013-01-24 LAB — POCT I-STAT, CHEM 8
Calcium, Ion: 1.07 mmol/L — ABNORMAL LOW (ref 1.12–1.23)
Glucose, Bld: 133 mg/dL — ABNORMAL HIGH (ref 70–99)
HCT: 47 % (ref 39.0–52.0)
Hemoglobin: 16 g/dL (ref 13.0–17.0)
Potassium: 7 mEq/L (ref 3.5–5.1)

## 2013-01-28 ENCOUNTER — Telehealth (INDEPENDENT_AMBULATORY_CARE_PROVIDER_SITE_OTHER): Payer: Self-pay

## 2013-01-28 NOTE — Telephone Encounter (Signed)
P/O appt 02/02/13@1P  with Dr. Ezzard Standing

## 2013-02-02 ENCOUNTER — Ambulatory Visit (INDEPENDENT_AMBULATORY_CARE_PROVIDER_SITE_OTHER): Payer: Federal, State, Local not specified - PPO | Admitting: Surgery

## 2013-02-02 ENCOUNTER — Encounter (INDEPENDENT_AMBULATORY_CARE_PROVIDER_SITE_OTHER): Payer: Self-pay | Admitting: Surgery

## 2013-02-02 VITALS — BP 134/78 | HR 74 | Temp 97.7°F | Resp 18 | Ht 70.0 in | Wt 188.8 lb

## 2013-02-02 DIAGNOSIS — Z9884 Bariatric surgery status: Secondary | ICD-10-CM

## 2013-02-02 NOTE — Progress Notes (Signed)
Re: Hector [name]  DOB: 17-Oct-1960  MRN: [id number]   ASSESSMENT AND PLAN:  1. History of RYGB - D. Ezzard Standing 11/06/2009   I last saw him around 09/2010 prior to the presentation with an internal hernia.  He will see me back at his anniversary - Feb 2015.  2. Internal hernia at the [facility] - repaired 01/20/2013 at [facility]  This is his post op visit.  He is doing well. 3. Diabetes mellitus - resolved with weight  4. Gout   No further since weight loss  5. Sleep apnea - resolved  6. Perforated ulcer    Repaired laparoscopically - M. Daphine Deutscher - 06/08/2010  7. History of kidney stones  8. History of anxiety  9.  Having back issues  Seeing Dr. Shela Commons. [place] 10.  To get colonoscopy by Dr. Arlyce Dice in June 2014  Chief Complaint   Patient presents with   .  Abdominal Pain    REFERRING PHYSICIAN: Dr. Eber Hong, [facility]   HISTORY OF PRESENT ILLNESS:  Hector [name] is a 52 y.o. (DOB: 01-16-1961) white male whose primary care physician is Lorretta Harp, MD and comes to me today for follow up of repairing an internal hernia at [facility] on 01/20/2013.   He is doing well.  No GI symptoms.  We talked about steroid dose packs and the risks of ulcer disease.  But I do not think he needs to be on prophylactic H2 blockers.  He is seeing Dr. Pat Patrick and they have talked about steroids for his back.  I think he was on steroids when he had the stomach perforation.  Otherwise he is going to see me in Feb 2014, his RYGB anniversary.  History of internal hernia: He was doing well and leaving [facility] where his father is a patient for cardiac disease. He aite a burger and fries and developed LUQ abdominal pain that became severe. It was different than the perforation pain that he had in 2011. He did not feel nauseated or vomited. He is a little better, but he has had pain meds. He has no history of liver disease, gall bladder disease, or colon disease. He did have an abdominal wall hernia repaired by Dr. Maryagnes Amos which predated his  RYGB. This was an onlay mesh.  He has been on Protonix since Feb when he had a workup for cardiac disease that proved negative.  CT scan, 12/21/2012, suggest SBO with bowel in RUQ. Reviewed case with Dr. Jacqualine Code.   Past Medical History  Diagnosis Date  . Hyperlipidemia   . Abnormal LFTs     improved after gastic bypass  . History of concussion   . Gout   . Perforated ulcer     Surgical repair9/24 2011  . B12 nutritional deficiency   . Closed head injury     laceration  9 12  no loc  vision change  . History of renal stone     Seen in emergency room not urologist  . Sleep apnea     had gastric bypass/ sa-gone  . Diabetes mellitus     resolved with gastric bypass  . Chest pain     hopsitalized 2 14 evaluation  . OBESITY 12/10/2007    Qualifier: Diagnosis of  By: Fabian Sharp MD, Neta Mends Gastric bypass   . ADJ DISORDER WITH MIXED ANXIETY & DEPRESSED MOOD 08/23/2009    Qualifier: Diagnosis of  By: Fabian Sharp MD, Neta Mends Now on lamictal and lexapro and elevil  for sleep.      Current Outpatient Prescriptions  Medication Sig Dispense Refill  . albuterol (PROVENTIL,VENTOLIN) 90 MCG/ACT inhaler Inhale 2 puffs into the lungs every 6 (six) hours as needed. For shortness of breath.      . B Complex-C (B-COMPLEX WITH VITAMIN C) tablet Take 1 tablet by mouth daily.      . calcium citrate (CALCITRATE) 950 MG tablet Take 1 tablet by mouth daily.       . Cholecalciferol 10000 UNITS CAPS Take 1 capsule by mouth daily.      [name] escitalopram (LEXAPRO) 20 MG tablet Take 20 mg by mouth daily.      . ferrous sulfate 325 (65 FE) MG tablet Take 325 mg by mouth daily with breakfast.      . indomethacin (INDOCIN) 50 MG capsule       . lamoTRIgine (LAMICTAL) 200 MG tablet Take 200 mg by mouth daily.      . methocarbamol (ROBAXIN) 750 MG tablet Take 750 mg by mouth 4 (four) times daily as needed. Back pain.      . Multiple Vitamin (MULTIVITAMIN WITH MINERALS) TABS Take 1 tablet by mouth daily.      [name]  oxyCODONE-acetaminophen (PERCOCET/ROXICET) 5-325 MG per tablet       . pantoprazole (PROTONIX) 40 MG tablet Take 1 tablet (40 mg total) by mouth daily.  30 tablet  5  . tamsulosin (FLOMAX) 0.4 MG CAPS Take 0.4 mg by mouth daily.      [name] zolpidem (AMBIEN) 10 MG tablet Take 10 mg by mouth at bedtime as needed. For sleep      . amitriptyline (ELAVIL) 50 MG tablet Take 50 mg by mouth at bedtime as needed for sleep.      [name] ondansetron (ZOFRAN-ODT) 8 MG disintegrating tablet       . oxyCODONE (ROXICODONE) 5 MG immediate release tablet Take 1-2 tablets (5-10 mg total) by mouth every 4 (four) hours as needed for pain.  40 tablet  0   No current facility-administered medications for this visit.    Allergies   Allergen  Reactions   .  Aspirin      REACTION: Pt had a allergic reaction as a perforated ulcer   .  Hydrocodone  Other (See Comments)     headache    REVIEW OF SYSTEMS:   Cardiac: No history of hypertension. No history of heart disease. Chest pain evaluated in Feb 2014 - which had a neg workup. He does not remember the cardiologist that saw him.  Pulmonary: Sleep apnea - resolved with weight loss.  Endocrine: Diabetes resolved. No thyroid disease.  Gastrointestinal: No history of liver disease. No history of gall bladder disease. No history of pancreas disease. No history of colon disease.  Urologic: History of kidney stones.  Musculoskeletal: Seeing Dr. Pat Patrick for some back issues.  Psycho-social: The patient is oriented. History of depression and anxiety.   SOCIAL and FAMILY HISTORY:  Married.  Works for [employer]   PHYSICAL EXAM:  BP 134/78  Pulse 74  Temp(Src) 97.7 F (36.5 C)  Resp 18  Ht 5\' 10"  (1.778 m)  Wt 188 lb 12.8 oz (85.639 kg)  BMI 27.09 kg/m2  General: WN WM who is alert.  Abdomen: Soft. No mass.  Normal bowel sounds. Transverse abdominal scar at umbilicus.   Incisions look good.  BS present.  DATA REVIEWED:  None since  hospitalization.   Ovidio Kin, MD, [facility]  [address], [address]  [place], [place] [address]  Phone: [phone] FAX: [phone]

## 2013-02-19 ENCOUNTER — Other Ambulatory Visit: Payer: Self-pay | Admitting: Internal Medicine

## 2013-03-01 ENCOUNTER — Encounter: Payer: Federal, State, Local not specified - PPO | Admitting: Gastroenterology

## 2013-03-02 ENCOUNTER — Emergency Department (HOSPITAL_COMMUNITY)
Admission: EM | Admit: 2013-03-02 | Discharge: 2013-03-02 | Disposition: A | Discharge status: Home / Self Care | Payer: Federal, State, Local not specified - PPO | Attending: Emergency Medicine | Admitting: Emergency Medicine

## 2013-03-02 ENCOUNTER — Encounter (HOSPITAL_COMMUNITY): Payer: Self-pay | Admitting: *Deleted

## 2013-03-02 ENCOUNTER — Emergency Department (HOSPITAL_COMMUNITY): Payer: Federal, State, Local not specified - PPO

## 2013-03-02 DIAGNOSIS — Z8639 Personal history of other endocrine, nutritional and metabolic disease: Secondary | ICD-10-CM | POA: Insufficient documentation

## 2013-03-02 DIAGNOSIS — Z862 Personal history of diseases of the blood and blood-forming organs and certain disorders involving the immune mechanism: Secondary | ICD-10-CM | POA: Insufficient documentation

## 2013-03-02 DIAGNOSIS — Z87828 Personal history of other (healed) physical injury and trauma: Secondary | ICD-10-CM | POA: Insufficient documentation

## 2013-03-02 DIAGNOSIS — R143 Flatulence: Secondary | ICD-10-CM | POA: Insufficient documentation

## 2013-03-02 DIAGNOSIS — Z9884 Bariatric surgery status: Secondary | ICD-10-CM | POA: Insufficient documentation

## 2013-03-02 DIAGNOSIS — Z9852 Vasectomy status: Secondary | ICD-10-CM | POA: Insufficient documentation

## 2013-03-02 DIAGNOSIS — R142 Eructation: Secondary | ICD-10-CM | POA: Insufficient documentation

## 2013-03-02 DIAGNOSIS — Z9889 Other specified postprocedural states: Secondary | ICD-10-CM | POA: Insufficient documentation

## 2013-03-02 DIAGNOSIS — Z8679 Personal history of other diseases of the circulatory system: Secondary | ICD-10-CM | POA: Insufficient documentation

## 2013-03-02 DIAGNOSIS — Z8719 Personal history of other diseases of the digestive system: Secondary | ICD-10-CM | POA: Insufficient documentation

## 2013-03-02 DIAGNOSIS — Z8659 Personal history of other mental and behavioral disorders: Secondary | ICD-10-CM | POA: Insufficient documentation

## 2013-03-02 DIAGNOSIS — E119 Type 2 diabetes mellitus without complications: Secondary | ICD-10-CM | POA: Insufficient documentation

## 2013-03-02 DIAGNOSIS — R310 Gross hematuria: Secondary | ICD-10-CM | POA: Insufficient documentation

## 2013-03-02 DIAGNOSIS — R141 Gas pain: Secondary | ICD-10-CM | POA: Insufficient documentation

## 2013-03-02 DIAGNOSIS — E785 Hyperlipidemia, unspecified: Secondary | ICD-10-CM | POA: Insufficient documentation

## 2013-03-02 DIAGNOSIS — N200 Calculus of kidney: Secondary | ICD-10-CM | POA: Insufficient documentation

## 2013-03-02 DIAGNOSIS — Z8669 Personal history of other diseases of the nervous system and sense organs: Secondary | ICD-10-CM | POA: Insufficient documentation

## 2013-03-02 DIAGNOSIS — Z79899 Other long term (current) drug therapy: Secondary | ICD-10-CM | POA: Insufficient documentation

## 2013-03-02 LAB — URINALYSIS, ROUTINE W REFLEX MICROSCOPIC
Glucose, UA: NEGATIVE mg/dL
Ketones, ur: 15 mg/dL — AB
Protein, ur: 100 mg/dL — AB

## 2013-03-02 LAB — BASIC METABOLIC PANEL
BUN: 11 mg/dL (ref 6–23)
CO2: 27 mEq/L (ref 19–32)
Calcium: 9.7 mg/dL (ref 8.4–10.5)
Creatinine, Ser: 1.05 mg/dL (ref 0.50–1.35)
Glucose, Bld: 99 mg/dL (ref 70–99)

## 2013-03-02 LAB — URINE MICROSCOPIC-ADD ON

## 2013-03-02 MED ORDER — OXYCODONE-ACETAMINOPHEN 5-325 MG PO TABS: 1.0000 | ORAL_TABLET | ORAL | Status: DC | PRN

## 2013-03-02 MED ORDER — SODIUM CHLORIDE 0.9 % IV BOLUS (SEPSIS)
1000.0000 mL | Freq: Once | INTRAVENOUS | Status: AC
  Administered 2013-03-02: 1000 mL via INTRAVENOUS

## 2013-03-02 MED ORDER — ONDANSETRON HCL 4 MG/2ML IJ SOLN
4.0000 mg | Freq: Once | INTRAMUSCULAR | Status: AC
  Administered 2013-03-02: 4 mg via INTRAVENOUS
  Filled 2013-03-02: qty 2

## 2013-03-02 MED ORDER — TAMSULOSIN HCL 0.4 MG PO CAPS: 0.4000 mg | ORAL_CAPSULE | Freq: Every day | ORAL | Status: DC

## 2013-03-02 MED ORDER — HYDROMORPHONE HCL PF 1 MG/ML IJ SOLN
1.0000 mg | Freq: Once | INTRAMUSCULAR | Status: AC
  Administered 2013-03-02: 1 mg via INTRAVENOUS
  Filled 2013-03-02: qty 1

## 2013-03-02 MED ORDER — ONDANSETRON 4 MG PO TBDP: 4.0000 mg | ORAL_TABLET | Freq: Three times a day (TID) | ORAL | Status: DC | PRN

## 2013-03-02 NOTE — Progress Notes (Signed)
Peak flow noted done on wrong pt.

## 2013-03-02 NOTE — Progress Notes (Signed)
Pre peak flow 130 post peak flow 125. Pt BS still remains wheezing.

## 2013-03-02 NOTE — ED Notes (Signed)
Pt c/o left flank pain since this am. Reports pain started at 1am. Reports hx of kidney stones and reports "I passed a stone this morning." also reports hematuria.

## 2013-03-02 NOTE — ED Provider Notes (Signed)
History     CSN: [id number] Arrival date & time 03/02/13  1403 First MD Initiated Contact with Patient 03/02/13 1405     Chief Complaint  Patient presents with  . Flank Pain   Patient is a 52 y.o. male presenting with flank pain.  Flank Pain    52 year old with history of nephrolithiasis with known 3mm stone in right kdiney lower pole and 5mm stone in lower pole of left kidney noted on CT scan on 01/20/13 presenting with left sided flank pain.   Patient woke up around 1 am with left sided flank pain radiating around to his groin. Went back to sleep but was restless and uncomfortable. Around 6am he urinated and noted gross hematuria. Had some relief of symptoms around that time and went to work but later pain flared again and he reported to ED. Pain at least at low level has been constant but comes in waves. Still radiating around to groin but not to testicle. Feels like previous kidney stones. Mild nausea associated with this. Afebrile. No chills. No vomiting. Passing gas and having regular bowel movements.   Past Medical History  Diagnosis Date  . Hyperlipidemia   . Abnormal LFTs     improved after gastic bypass  . History of concussion   . Gout   . Perforated ulcer     Surgical repair9/24 2011  . B12 nutritional deficiency   . Closed head injury     laceration  9 12  no loc  vision change  . History of renal stone     Seen in emergency room not urologist  . Sleep apnea     had gastric bypass/ sa-gone  . Diabetes mellitus     resolved with gastric bypass  . Chest pain     hopsitalized 2 14 evaluation  . OBESITY 12/10/2007    Qualifier: Diagnosis of  By: Fabian Sharp MD, Neta Mends Gastric bypass   . 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     Past Surgical History  Procedure Laterality Date  . Vasectomy    . Hernia repair      umbilical   . Nasal septoplasty w/  turbinoplasty      x 2  . Gastric bypass  11/06/09    beginning weight 267  . Epidural block injection      by Dr. Ethelene Hal  . Surgerical repair of stomach ulcer  06/08/10    per  . Back surgery      Dr. Shelle Iron  . Hiatal hernia repair N/A 01/20/2013    Procedure: LAPAROSCOPIC REPAIR OF INTERNAL HERNIA;  Surgeon: Kandis Cocking, MD;  Location: [facility];  Service: General;  Laterality: N/A;    Family History  Problem Relation Age of Onset  . Uterine cancer Mother   . Hypertension Mother   . Cervical cancer Mother   . Heart disease Father   . COPD Father     History  Substance Use Topics  . Smoking status: Never Smoker   . Smokeless tobacco: Never Used  . Alcohol Use: No     Comment: socially      Review of Systems  Genitourinary: Positive for flank pain.    Allergies  Aspirin and Hydrocodone  Home Medications   Current Outpatient Rx  Name  Route  Sig  Dispense  Refill  .  albuterol (PROVENTIL,VENTOLIN) 90 MCG/ACT inhaler   Inhalation   Inhale 2 puffs into the lungs every 6 (six) hours as needed. For shortness of breath.         [name] amitriptyline (ELAVIL) 50 MG tablet   Oral   Take 50 mg by mouth at bedtime as needed for sleep.         . B Complex-C (B-COMPLEX WITH VITAMIN C) tablet   Oral   Take 1 tablet by mouth daily.         . calcium citrate (CALCITRATE) 950 MG tablet   Oral   Take 1 tablet by mouth daily.          . Cholecalciferol 10000 UNITS CAPS   Oral   Take 1 capsule by mouth daily.         [name] escitalopram (LEXAPRO) 20 MG tablet   Oral   Take 20 mg by mouth daily.         . ferrous sulfate 325 (65 FE) MG tablet   Oral   Take 325 mg by mouth daily with breakfast.         . indomethacin (INDOCIN) 50 MG capsule               . indomethacin (INDOCIN) 50 MG capsule      TAKE ONE CAPSULE BY MOUTH 3 TIMES A DAY AS NEEDED.   40 capsule   0   . lamoTRIgine (LAMICTAL) 200 MG tablet   Oral   Take 200 mg by mouth daily.         .  methocarbamol (ROBAXIN) 750 MG tablet   Oral   Take 750 mg by mouth 4 (four) times daily as needed. Back pain.         . Multiple Vitamin (MULTIVITAMIN WITH MINERALS) TABS   Oral   Take 1 tablet by mouth daily.         . ondansetron (ZOFRAN-ODT) 8 MG disintegrating tablet               . oxyCODONE (ROXICODONE) 5 MG immediate release tablet   Oral   Take 1-2 tablets (5-10 mg total) by mouth every 4 (four) hours as needed for pain.   40 tablet   0   . oxyCODONE-acetaminophen (PERCOCET/ROXICET) 5-325 MG per tablet               . pantoprazole (PROTONIX) 40 MG tablet   Oral   Take 1 tablet (40 mg total) by mouth daily.   30 tablet   5   . tamsulosin (FLOMAX) 0.4 MG CAPS   Oral   Take 0.4 mg by mouth daily.         [name] zolpidem (AMBIEN) 10 MG tablet   Oral   Take 10 mg by mouth at bedtime as needed. For sleep           BP 148/101  Pulse 65  Temp(Src) 98.2 F (36.8 C) (Oral)  Resp 20  Ht 5\' 10"  (1.778 m)  Wt 182 lb (82.555 kg)  BMI 26.11 kg/m2  SpO2 100%  Physical Exam  Constitutional: He is oriented to person, place, and time. He appears well-developed and well-nourished. He appears distressed (appears uncomfortable, holding left flank).  HENT:  Head: Normocephalic and atraumatic.  Eyes: EOM are normal. Pupils are equal, round, and reactive to light.  Neck: Normal range of motion. Neck supple.  Cardiovascular: Normal rate and regular rhythm.  Exam reveals no gallop and no friction  rub.   No murmur heard. Pulmonary/Chest: Effort normal and breath sounds normal. He has no wheezes. He has no rales.  Abdominal: Soft. Bowel sounds are normal. He exhibits no distension. There is no tenderness. There is no rebound.  Musculoskeletal: Normal range of motion. He exhibits no edema.  Left CVA tenderness noted  Neurological: He is alert and oriented to person, place, and time. He exhibits normal muscle tone. Coordination normal.  Skin: Skin is warm and dry.     ED Course  Procedures (including critical care time)  Labs Reviewed  URINALYSIS, ROUTINE W REFLEX MICROSCOPIC - Abnormal; Notable for the following:    Color, Urine RED (*)    APPearance CLOUDY (*)    Specific Gravity, Urine 1.031 (*)    Hgb urine dipstick LARGE (*)    Bilirubin Urine MODERATE (*)    Ketones, ur 15 (*)    Protein, ur 100 (*)    Leukocytes, UA SMALL (*)    All other components within normal limits  BASIC METABOLIC PANEL - Abnormal; Notable for the following:    GFR calc non Af Amer 80 (*)    All other components within normal limits  URINE MICROSCOPIC-ADD ON - Abnormal; Notable for the following:    Casts HYALINE CASTS (*)    All other components within normal limits   No results found. Pending renal ultrasound  1. Nephrolithiasis    MDM  52 year old with history of nephrolithiasis with known 3mm stone in right kdiney lower pole and 5mm stone in lower pole of left kidney noted on CT scan on 01/20/13 presenting with left sided flank pain radiating to groin concerning for kidney stone. BMET without elevated creatinine. Will obtain renal ultrasound to evaluate for hydronephrosis. Will stress to patient need for follow up with urologist as he states he has had several ED visits for kidney stones in last year. Percocet/zofran for symptomatic relief (hydrocodone allergy)  Care transitioned to Dr. Rhunette Croft awaiting ultrasound.  Addendum 6/19: no mention of hydronephrosis on ultrasound. Patient discharged.    Shelva Majestic, MD 03/03/13 [phone]  I performed a history and physical examination of  Aram Candela and discussed his management with Dr. Durene Cal. I agree with the history, physical, assessment, and plan of care, with the following exceptions: None I was present for the following procedures: None  Time Spent in Critical Care of the patient: None  Time spent in discussions with the patient and family: 10 minutes  Has known renal stones. 2 CT's already this  year. I think the current sx are consistent with a stone Will check Cr and get [place] to r/o hydronephrosis.  Alen Matheson   Derwood Kaplan, MD 03/04/13 [phone]

## 2013-04-13 ENCOUNTER — Other Ambulatory Visit: Payer: Self-pay | Admitting: Orthopedic Surgery

## 2013-04-13 ENCOUNTER — Encounter (HOSPITAL_BASED_OUTPATIENT_CLINIC_OR_DEPARTMENT_OTHER): Payer: Self-pay | Admitting: *Deleted

## 2013-04-13 NOTE — Progress Notes (Signed)
Has lost over 100 lb since gastric bp-not on any htn or diabetic meds-doing well-had some sob 2/14-had stress and echo-normal-has had multiple surgeries no problems

## 2013-04-15 NOTE — H&P (Signed)
Hector [name] is an 52 y.o. male.   CC / Reason for Visit: Right small finger lesion HPI: This patient is a 52 year old male who presents for evaluation of his right small finger.  He reports that about 6 months ago he broke a bottle of salsa in his kitchen.  He cleaned it up without incident but shortly thereafter noticed the development of a lesion on the tip of the right small finger and wonders whether it was possible that he got a shard of glass within it.  It forms a scab over it is sometimes a scab comes off and when he squeezes it it might bleed fairly well.  He bruises easily due to some type of vitamin deficiency that has been worked up in the past by hematology.  Past Medical History  Diagnosis Date  . Hyperlipidemia   . Abnormal LFTs     improved after gastic bypass  . History of concussion   . Gout   . Perforated ulcer     Surgical repair9/24 2011  . B12 nutritional deficiency   . Closed head injury     laceration  9 12  no loc  vision change  . History of renal stone     Seen in emergency room not urologist  . Diabetes mellitus     resolved with gastric bypass  . Chest pain     hopsitalized 2 14 evaluation  . OBESITY 12/10/2007    Qualifier: Diagnosis of  By: Fabian Sharp MD, Neta Mends Gastric bypass   . 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   . Sleep apnea     had gastric bypass/ sa-gone  . GERD (gastroesophageal reflux disease)     no meds  . DDD (degenerative disc disease)     Past Surgical History  Procedure Laterality Date  . Vasectomy    . Nasal septoplasty w/ turbinoplasty      x 2  . Gastric bypass  11/06/09    beginning weight 267  . Epidural block injection      by Dr. Ethelene Hal  . Surgerical repair of stomach ulcer  06/08/10    per  . Hiatal hernia repair N/A 01/20/2013    Procedure: LAPAROSCOPIC REPAIR OF INTERNAL HERNIA;  Surgeon: Kandis Cocking, MD;  Location: [facility];  Service: General;  Laterality: N/A;  . Back surgery  2012    Dr. Delorse Limber disk  . Hernia repair  1998    umbilical   . Tonsillectomy    . Uvulopalatopharyngoplasty  2007    with tonsils  . Shoulder arthroscopy  2014    rt  . Carpal tunnel release  2008    left-ulnar nerve decomp    Family History  Problem Relation Age of Onset  . Uterine cancer Mother   . Hypertension Mother   . Cervical cancer Mother   . Heart disease Father   . COPD Father    Social History:  reports that he has never smoked. He has never used smokeless tobacco. He reports that he does not drink alcohol or use illicit drugs.  Allergies:  Allergies  Allergen Reactions  . Aspirin     REACTION: Pt had a allergic reaction as a perforated ulcer  . Hydrocodone Other (See Comments)    headache    No prescriptions prior to admission  No results found for this or any previous visit (from the past 48 hour(s)). No results found.  Review of Systems  All other systems reviewed and are negative.    There were no vitals taken for this visit. Physical Exam  Constitutional:  WD, WN, NAD HEENT:  NCAT, EOMI Neuro/Psych:  Alert & oriented to person, place, and time; appropriate mood & affect Lymphatic: No generalized UE edema or lymphadenopathy Extremities / MSK:  Both UE are normal with respect to appearance, ranges of motion, joint stability, muscle strength/tone, sensation, & perfusion except as otherwise noted:  Right small finger has a 2 mm diameter lesion where the upper layers of epidermis are gone.  The dermis is intact and there is a very small punctate red spot in the middle of it.  This lesion itself was a little firm and may represent some form body reaction deep to the colored area  Labs / Xrays:  No radiographic studies obtained today.  Assessment: Possible right small finger foreign body granuloma versus unrelated soft tissue lesion of the skin  Plan:  I  discussed with him the plan for excision and evaluation pathologically.  This is small enough I will plan to ellipse it and close it primarily, at a surgery center under digital block with sedation.  The details of the operative procedure were discussed with the patient.  Questions were invited and answered.  In addition to the goal of the procedure, the risks of the procedure to include but not limited to bleeding; infection; damage to the nerves or blood vessels that could result in bleeding, numbness, weakness, chronic pain, and the need for additional procedures; stiffness; the need for revision surgery; and anesthetic risks, the worst of which is death, were reviewed.  No specific outcome was guaranteed or implied.  Informed consent was obtained.   [name] A. 04/15/2013, 3:15 PM

## 2013-04-17 ENCOUNTER — Other Ambulatory Visit: Payer: Self-pay | Admitting: Internal Medicine

## 2013-04-18 ENCOUNTER — Encounter (HOSPITAL_BASED_OUTPATIENT_CLINIC_OR_DEPARTMENT_OTHER)
Admission: RE | Disposition: A | Discharge status: Home / Self Care | Payer: Self-pay | Source: Ambulatory Visit | Attending: Orthopedic Surgery

## 2013-04-18 ENCOUNTER — Ambulatory Visit (HOSPITAL_BASED_OUTPATIENT_CLINIC_OR_DEPARTMENT_OTHER)
Admission: RE | Admit: 2013-04-18 | Discharge: 2013-04-18 | Disposition: A | Discharge status: Home / Self Care | Payer: Federal, State, Local not specified - PPO | Source: Ambulatory Visit | Attending: Orthopedic Surgery | Admitting: Orthopedic Surgery

## 2013-04-18 ENCOUNTER — Ambulatory Visit (HOSPITAL_BASED_OUTPATIENT_CLINIC_OR_DEPARTMENT_OTHER): Payer: Federal, State, Local not specified - PPO | Admitting: Certified Registered Nurse Anesthetist

## 2013-04-18 ENCOUNTER — Encounter (HOSPITAL_BASED_OUTPATIENT_CLINIC_OR_DEPARTMENT_OTHER): Payer: Self-pay | Admitting: Certified Registered Nurse Anesthetist

## 2013-04-18 ENCOUNTER — Encounter (HOSPITAL_BASED_OUTPATIENT_CLINIC_OR_DEPARTMENT_OTHER): Payer: Self-pay

## 2013-04-18 DIAGNOSIS — F4323 Adjustment disorder with mixed anxiety and depressed mood: Secondary | ICD-10-CM | POA: Insufficient documentation

## 2013-04-18 DIAGNOSIS — Z8711 Personal history of peptic ulcer disease: Secondary | ICD-10-CM | POA: Insufficient documentation

## 2013-04-18 DIAGNOSIS — Z885 Allergy status to narcotic agent status: Secondary | ICD-10-CM | POA: Insufficient documentation

## 2013-04-18 DIAGNOSIS — D1801 Hemangioma of skin and subcutaneous tissue: Secondary | ICD-10-CM | POA: Insufficient documentation

## 2013-04-18 DIAGNOSIS — Z886 Allergy status to analgesic agent status: Secondary | ICD-10-CM | POA: Insufficient documentation

## 2013-04-18 DIAGNOSIS — Z87820 Personal history of traumatic brain injury: Secondary | ICD-10-CM | POA: Insufficient documentation

## 2013-04-18 DIAGNOSIS — E785 Hyperlipidemia, unspecified: Secondary | ICD-10-CM | POA: Insufficient documentation

## 2013-04-18 DIAGNOSIS — Z9884 Bariatric surgery status: Secondary | ICD-10-CM | POA: Insufficient documentation

## 2013-04-18 DIAGNOSIS — IMO0002 Reserved for concepts with insufficient information to code with codable children: Secondary | ICD-10-CM | POA: Insufficient documentation

## 2013-04-18 DIAGNOSIS — M109 Gout, unspecified: Secondary | ICD-10-CM | POA: Insufficient documentation

## 2013-04-18 DIAGNOSIS — E538 Deficiency of other specified B group vitamins: Secondary | ICD-10-CM | POA: Insufficient documentation

## 2013-04-18 DIAGNOSIS — G709 Myoneural disorder, unspecified: Secondary | ICD-10-CM | POA: Insufficient documentation

## 2013-04-18 DIAGNOSIS — E119 Type 2 diabetes mellitus without complications: Secondary | ICD-10-CM | POA: Insufficient documentation

## 2013-04-18 DIAGNOSIS — K219 Gastro-esophageal reflux disease without esophagitis: Secondary | ICD-10-CM | POA: Insufficient documentation

## 2013-04-18 HISTORY — DX: Gastro-esophageal reflux disease without esophagitis: K21.9

## 2013-04-18 HISTORY — DX: Reserved for concepts with insufficient information to code with codable children: IMO0002

## 2013-04-18 HISTORY — PX: MASS EXCISION: SHX2000

## 2013-04-18 LAB — POCT HEMOGLOBIN-HEMACUE: Hemoglobin: 16.4 g/dL (ref 13.0–17.0)

## 2013-04-18 SURGERY — EXCISION MASS
Anesthesia: Monitor Anesthesia Care | Site: Finger | Laterality: Right | Wound class: Clean

## 2013-04-18 MED ORDER — BUPIVACAINE-EPINEPHRINE 0.5% -1:200000 IJ SOLN
INTRAMUSCULAR | Status: DC | PRN
  Administered 2013-04-18: 5 mL

## 2013-04-18 MED ORDER — LIDOCAINE HCL (CARDIAC) 20 MG/ML IV SOLN
INTRAVENOUS | Status: DC | PRN
  Administered 2013-04-18: 60 mg via INTRAVENOUS

## 2013-04-18 MED ORDER — FENTANYL CITRATE 0.05 MG/ML IJ SOLN
INTRAMUSCULAR | Status: DC | PRN
  Administered 2013-04-18: 50 ug via INTRAVENOUS

## 2013-04-18 MED ORDER — MIDAZOLAM HCL 2 MG/2ML IJ SOLN: 1.0000 mg | INTRAMUSCULAR | Status: DC | PRN

## 2013-04-18 MED ORDER — LACTATED RINGERS IV SOLN
INTRAVENOUS | Status: DC
  Administered 2013-04-18: 08:00:00 via INTRAVENOUS

## 2013-04-18 MED ORDER — OXYCODONE-ACETAMINOPHEN 5-325 MG PO TABS: 1.0000 | ORAL_TABLET | ORAL | Status: DC | PRN

## 2013-04-18 MED ORDER — HYDROMORPHONE HCL PF 1 MG/ML IJ SOLN: 0.5000 mg | INTRAMUSCULAR | Status: DC | PRN

## 2013-04-18 MED ORDER — MIDAZOLAM HCL 5 MG/5ML IJ SOLN
INTRAMUSCULAR | Status: DC | PRN
  Administered 2013-04-18: 1 mg via INTRAVENOUS

## 2013-04-18 MED ORDER — FENTANYL CITRATE 0.05 MG/ML IJ SOLN: 50.0000 ug | INTRAMUSCULAR | Status: DC | PRN

## 2013-04-18 MED ORDER — LIDOCAINE HCL 2 % IJ SOLN
INTRAMUSCULAR | Status: DC | PRN
  Administered 2013-04-18: 5 mL

## 2013-04-18 MED ORDER — CEFAZOLIN SODIUM-DEXTROSE 2-3 GM-% IV SOLR: 2.0000 g | INTRAVENOUS | Status: DC

## 2013-04-18 MED ORDER — PROPOFOL INFUSION 10 MG/ML OPTIME
INTRAVENOUS | Status: DC | PRN
  Administered 2013-04-18: 100 ug/kg/min via INTRAVENOUS

## 2013-04-18 MED ORDER — LACTATED RINGERS IV SOLN: INTRAVENOUS | Status: DC

## 2013-04-18 SURGICAL SUPPLY — 42 items
BANDAGE COBAN STERILE 2 (GAUZE/BANDAGES/DRESSINGS) IMPLANT
BANDAGE GAUZE ELAST BULKY 4 IN (GAUZE/BANDAGES/DRESSINGS) ×4 IMPLANT
BANDAGE GAUZE STRT 1 STR LF (GAUZE/BANDAGES/DRESSINGS) IMPLANT
BLADE MINI RND TIP GREEN BEAV (BLADE) IMPLANT
BLADE SURG 15 STRL LF DISP TIS (BLADE) ×1 IMPLANT
BLADE SURG 15 STRL SS (BLADE) ×2
BNDG CMPR 9X4 STRL LF SNTH (GAUZE/BANDAGES/DRESSINGS)
BNDG COHESIVE 4X5 TAN STRL (GAUZE/BANDAGES/DRESSINGS) ×2 IMPLANT
BNDG ESMARK 4X9 LF (GAUZE/BANDAGES/DRESSINGS) IMPLANT
CHLORAPREP W/TINT 26ML (MISCELLANEOUS) ×2 IMPLANT
CORDS BIPOLAR (ELECTRODE) IMPLANT
COVER MAYO STAND STRL (DRAPES) ×2 IMPLANT
COVER TABLE BACK 60X90 (DRAPES) ×2 IMPLANT
CUFF TOURNIQUET SINGLE 18IN (TOURNIQUET CUFF) IMPLANT
DRAIN PENROSE 1/2X12 LTX STRL (WOUND CARE) IMPLANT
DRAPE EXTREMITY T 121X128X90 (DRAPE) ×2 IMPLANT
DRAPE SURG 17X23 STRL (DRAPES) ×2 IMPLANT
DRSG EMULSION OIL 3X3 NADH (GAUZE/BANDAGES/DRESSINGS) ×2 IMPLANT
GLOVE BIO SURGEON STRL SZ 6.5 (GLOVE) ×1 IMPLANT
GLOVE BIO SURGEON STRL SZ7.5 (GLOVE) ×2 IMPLANT
GLOVE BIOGEL PI IND STRL 7.0 (GLOVE) IMPLANT
GLOVE BIOGEL PI IND STRL 8 (GLOVE) ×1 IMPLANT
GLOVE BIOGEL PI INDICATOR 7.0 (GLOVE) ×1
GLOVE BIOGEL PI INDICATOR 8 (GLOVE) ×1
GOWN PREVENTION PLUS XLARGE (GOWN DISPOSABLE) ×2 IMPLANT
NDL HYPO 25X1 1.5 SAFETY (NEEDLE) IMPLANT
NEEDLE HYPO 25X1 1.5 SAFETY (NEEDLE) IMPLANT
NS IRRIG 1000ML POUR BTL (IV SOLUTION) ×2 IMPLANT
PACK BASIN DAY SURGERY FS (CUSTOM PROCEDURE TRAY) ×2 IMPLANT
PAD CAST 4YDX4 CTTN HI CHSV (CAST SUPPLIES) ×1 IMPLANT
PADDING CAST ABS 4INX4YD NS (CAST SUPPLIES)
PADDING CAST ABS COTTON 4X4 ST (CAST SUPPLIES) IMPLANT
PADDING CAST COTTON 4X4 STRL (CAST SUPPLIES) ×2
RUBBERBAND STERILE (MISCELLANEOUS) IMPLANT
SPONGE GAUZE 4X4 12PLY (GAUZE/BANDAGES/DRESSINGS) ×2 IMPLANT
STOCKINETTE 4X48 STRL (DRAPES) ×2 IMPLANT
SUT VICRYL RAPIDE 4/0 PS 2 (SUTURE) IMPLANT
SYR BULB 3OZ (MISCELLANEOUS) ×2 IMPLANT
SYRINGE 10CC LL (SYRINGE) IMPLANT
TOWEL OR 17X24 6PK STRL BLUE (TOWEL DISPOSABLE) ×2 IMPLANT
TOWEL OR NON WOVEN STRL DISP B (DISPOSABLE) ×2 IMPLANT
UNDERPAD 30X30 INCONTINENT (UNDERPADS AND DIAPERS) ×2 IMPLANT

## 2013-04-18 NOTE — Interval H&P Note (Signed)
History and Physical Interval Note:  04/18/2013 8:58 AM  Hector [name]  has presented today for surgery, with the diagnosis of Right Small Finger Skin Lesion  The various methods of treatment have been discussed with the patient and family. After consideration of risks, benefits and other options for treatment, the patient has consented to  Procedure(s): RIGHT SMALL FINGER SKIN LESION EXCISION (Right) as a surgical intervention .  The patient's history has been reviewed, patient examined, no change in status, stable for surgery.  I have reviewed the patient's chart and labs.  Questions were answered to the patient's satisfaction.     Swati Granberry A.

## 2013-04-18 NOTE — Transfer of Care (Signed)
Immediate Anesthesia Transfer of Care Note  Patient: Hector [name]  Procedure(s) Performed: Procedure(s): RIGHT SMALL FINGER SKIN LESION EXCISION (Right)  Patient Location: PACU  Anesthesia Type:MAC  Level of Consciousness: awake, alert  and oriented  Airway & Oxygen Therapy: Patient Spontanous Breathing and Patient connected to face mask oxygen  Post-op Assessment: Report given to PACU RN, Post -op Vital signs reviewed and stable and Patient moving all extremities  Post vital signs: Reviewed and stable  Complications: No apparent anesthesia complications

## 2013-04-18 NOTE — Op Note (Signed)
04/18/2013  9:26 AM  PATIENT:  Hector [name]  52 y.o. male  PRE-OPERATIVE DIAGNOSIS:  Right small finger skin lesion  POST-OPERATIVE DIAGNOSIS:  Same  PROCEDURE:  Right small finger skin lesion elliptical excision, 1 cm total length  SURGEON: Cliffton Asters. Janee Morn, MD  PHYSICIAN ASSISTANT: None  ANESTHESIA:  local and MAC  SPECIMENS:  To pathology  DRAINS:   None  PREOPERATIVE INDICATIONS:  Hector [name] is a  52 y.o. male with a diagnosis of right small finger skin lesion who elected for surgical management.    The risks benefits and alternatives were discussed with the patient preoperatively including but not limited to the risks of infection, bleeding, nerve injury, cardiopulmonary complications, the need for revision surgery, among others, and the patient verbalized understanding and consented to proceed.  OPERATIVE IMPLANTS: None  OPERATIVE FINDINGS: A somewhat fibrotic globular organization of subcutaneous tissue deep to the skin lesion excised en masse  OPERATIVE PROCEDURE:  After receiving prophylactic antibiotics, the patient was escorted to the operative theatre and placed in a supine position.  A surgical "time-out" was performed during which the planned procedure, proposed operative site, and the correct patient identity were compared to the operative consent and agreement confirmed by the circulating nurse according to current facility policy. A digital block was given by me at the base of the small finger using a mixture of lidocaine and Marcaine bearing epinephrine. Following application of a tourniquet to the operative extremity, the exposed skin was prepped with Chloraprep and draped in the usual sterile fashion.  The limb was exsanguinated with an Esmarch bandage and the tourniquet inflated to approximately higher than systolic BP.  The skin lesion was elliptically excised using a Beaver blade. This included the skin and the deeper subcutaneous tissue that was  adherent to the skin itself. The specimen was passed off, tourniquet released, additional hemostasis obtained with direct pressure and bipolar electrocautery and the wound is irrigated. The skin was closed with 4-0 Vicryl Rapide simple sutures and a digital dressing was applied. He was taken to the recovery room in stable condition.  DISPOSITION: He'll be discharged home with typical postop instructions, returning in 10:15 days for reevaluation and review of his pathology report.

## 2013-04-18 NOTE — Anesthesia Procedure Notes (Signed)
Procedure Name: MAC Date/Time: 04/18/2013 9:00 AM Performed by: Brodan Grewell D Pre-anesthesia Checklist: Patient identified, Emergency Drugs available, Suction available, Patient being monitored and Timeout performed Patient Re-evaluated:Patient Re-evaluated prior to inductionOxygen Delivery Method: Simple face mask

## 2013-04-18 NOTE — Anesthesia Postprocedure Evaluation (Signed)
  Anesthesia Post-op Note  Patient: Hector [name]  Procedure(s) Performed: Procedure(s): RIGHT SMALL FINGER SKIN LESION EXCISION (Right)  Patient Location: PACU  Anesthesia Type:MAC  Level of Consciousness: awake, alert  and oriented  Airway and Oxygen Therapy: Patient Spontanous Breathing  Post-op Pain: none  Post-op Assessment: Post-op Vital signs reviewed  Post-op Vital Signs: Reviewed  Complications: No apparent anesthesia complications

## 2013-04-18 NOTE — Anesthesia Preprocedure Evaluation (Addendum)
Anesthesia Evaluation  Patient identified by MRN, date of birth, ID band Patient awake    Reviewed: Allergy & Precautions, NPO status   Airway       Dental   Pulmonary asthma , sleep apnea ,          Cardiovascular     Neuro/Psych  Headaches, PSYCHIATRIC DISORDERS Anxiety Depression  Neuromuscular disease    GI/Hepatic PUD, GERD-  Medicated,  Endo/Other  diabetes, Type 2  Renal/GU      Musculoskeletal   Abdominal   Peds  Hematology   Anesthesia Other Findings   Reproductive/Obstetrics                           Anesthesia Physical Anesthesia Plan  ASA: II  Anesthesia Plan:    Post-op Pain Management:    Induction:   Airway Management Planned:   Additional Equipment:   Intra-op Plan:   Post-operative Plan:   Informed Consent:   Plan Discussed with:   Anesthesia Plan Comments:         Anesthesia Quick Evaluation

## 2013-04-20 ENCOUNTER — Other Ambulatory Visit: Payer: Self-pay | Admitting: Internal Medicine

## 2013-04-20 ENCOUNTER — Encounter (HOSPITAL_BASED_OUTPATIENT_CLINIC_OR_DEPARTMENT_OTHER): Payer: Self-pay | Admitting: Orthopedic Surgery

## 2013-04-21 NOTE — Telephone Encounter (Signed)
Caller: Hector [name]/Patient; Phone: [phone]; Reason for Call: Patient is going out of town and would like to have a refill of Zofran and Indomethacin while he is gone in case he will get a kidney stone or headache.  [facility] [employer] [facility] will [facility] does have a medical unit but they are not allowed to prescribe any medication while he is there.  Please contact patient if you have any questions.  [facility] [address]; [phone].  Patient would like a call back either way to let him know if this can be done or not.  Thanks.

## 2013-04-25 ENCOUNTER — Other Ambulatory Visit: Payer: Self-pay | Admitting: Family Medicine

## 2013-04-25 ENCOUNTER — Telehealth: Payer: Self-pay | Admitting: Internal Medicine

## 2013-04-25 MED ORDER — ONDANSETRON 4 MG PO TBDP: 4.0000 mg | ORAL_TABLET | Freq: Three times a day (TID) | ORAL | Status: DC | PRN

## 2013-04-25 NOTE — Telephone Encounter (Signed)
Note states for kidney stone flare up.

## 2013-04-25 NOTE — Telephone Encounter (Signed)
Refilled #20 per last note from [facility].  Ok to refill prn for nausea.  Last filled 03/02/13.  WP out of the office.  All other refills will go through her.

## 2013-04-25 NOTE — Telephone Encounter (Signed)
Please ask pt why he needs a refill of this medication.  Not seen since April.  If needed for bariatric surgery, will need to call that physician.

## 2013-04-25 NOTE — Telephone Encounter (Signed)
Patient Information:  Caller Name: Hector [name]  Phone: [phone]  Patient: Hector, [name]  Gender: Male  DOB: [birth year]  Age: 52 Years  PCP: Berniece Andreas [place])  Office Follow Up:  Does the office need to follow up with this patient?: Yes  Instructions For The Office: This is patient's 3rd attempt to get a refill on generic Zofran before leaving on business trip tomorrow 04/26/13.  Is asking for refill and a call to let him know this has been done.  RN Note:  [facility] [address] [facility]  Is also requesting a call back by nurse when this has been handled.  Symptoms  Reason For Call & Symptoms: Calling regarding Zofran refill.  Patient called last week with request for refill due to upcoming business trip.  Also contacted pharmacy to request the same.  Leaves in the morning for the trip.  He wants to have on hand in case of a kidney stone flare up. Last refill was in 06/14 with last office visit with provider in 04/14.  Reviewed Health History In EMR: N/A  Reviewed Medications In EMR: N/A  Reviewed Allergies In EMR: N/A  Reviewed Surgeries / Procedures: N/A  Date of Onset of Symptoms: Unknown  Guideline(s) Used:  No Protocol Available - Information Only  Disposition Per Guideline:   Discuss with PCP and Callback by Nurse Today  Reason For Disposition Reached:   Nursing judgment  Advice Given:  Call Back If:  You become worse.  Patient Will Follow Care Advice:  YES

## 2013-06-13 ENCOUNTER — Other Ambulatory Visit: Payer: Self-pay | Admitting: Internal Medicine

## 2013-06-15 ENCOUNTER — Encounter (HOSPITAL_COMMUNITY): Payer: Self-pay | Admitting: Emergency Medicine

## 2013-06-15 ENCOUNTER — Emergency Department (HOSPITAL_COMMUNITY)
Admission: EM | Admit: 2013-06-15 | Discharge: 2013-06-15 | Disposition: A | Discharge status: Home / Self Care | Payer: Federal, State, Local not specified - PPO | Attending: Emergency Medicine | Admitting: Emergency Medicine

## 2013-06-15 ENCOUNTER — Emergency Department (HOSPITAL_COMMUNITY): Payer: Federal, State, Local not specified - PPO

## 2013-06-15 DIAGNOSIS — R350 Frequency of micturition: Secondary | ICD-10-CM | POA: Insufficient documentation

## 2013-06-15 DIAGNOSIS — Z8739 Personal history of other diseases of the musculoskeletal system and connective tissue: Secondary | ICD-10-CM | POA: Insufficient documentation

## 2013-06-15 DIAGNOSIS — Z8719 Personal history of other diseases of the digestive system: Secondary | ICD-10-CM | POA: Insufficient documentation

## 2013-06-15 DIAGNOSIS — R110 Nausea: Secondary | ICD-10-CM | POA: Insufficient documentation

## 2013-06-15 DIAGNOSIS — Z8639 Personal history of other endocrine, nutritional and metabolic disease: Secondary | ICD-10-CM | POA: Insufficient documentation

## 2013-06-15 DIAGNOSIS — Z87442 Personal history of urinary calculi: Secondary | ICD-10-CM | POA: Insufficient documentation

## 2013-06-15 DIAGNOSIS — E119 Type 2 diabetes mellitus without complications: Secondary | ICD-10-CM | POA: Insufficient documentation

## 2013-06-15 DIAGNOSIS — R109 Unspecified abdominal pain: Secondary | ICD-10-CM

## 2013-06-15 DIAGNOSIS — R300 Dysuria: Secondary | ICD-10-CM | POA: Insufficient documentation

## 2013-06-15 DIAGNOSIS — E785 Hyperlipidemia, unspecified: Secondary | ICD-10-CM | POA: Insufficient documentation

## 2013-06-15 DIAGNOSIS — Z87820 Personal history of traumatic brain injury: Secondary | ICD-10-CM | POA: Insufficient documentation

## 2013-06-15 DIAGNOSIS — E669 Obesity, unspecified: Secondary | ICD-10-CM | POA: Insufficient documentation

## 2013-06-15 DIAGNOSIS — Z862 Personal history of diseases of the blood and blood-forming organs and certain disorders involving the immune mechanism: Secondary | ICD-10-CM | POA: Insufficient documentation

## 2013-06-15 DIAGNOSIS — G4730 Sleep apnea, unspecified: Secondary | ICD-10-CM | POA: Insufficient documentation

## 2013-06-15 DIAGNOSIS — Z79899 Other long term (current) drug therapy: Secondary | ICD-10-CM | POA: Insufficient documentation

## 2013-06-15 DIAGNOSIS — F4323 Adjustment disorder with mixed anxiety and depressed mood: Secondary | ICD-10-CM | POA: Insufficient documentation

## 2013-06-15 LAB — CBC WITH DIFFERENTIAL/PLATELET
Eosinophils Absolute: 0.1 10*3/uL (ref 0.0–0.7)
Eosinophils Relative: 1 % (ref 0–5)
Hemoglobin: 14.5 g/dL (ref 13.0–17.0)
Lymphocytes Relative: 33 % (ref 12–46)
Lymphs Abs: 1.7 10*3/uL (ref 0.7–4.0)
MCH: 33 pg (ref 26.0–34.0)
MCV: 95.5 fL (ref 78.0–100.0)
Monocytes Relative: 7 % (ref 3–12)
Neutrophils Relative %: 59 % (ref 43–77)
Platelets: 163 10*3/uL (ref 150–400)
RBC: 4.4 MIL/uL (ref 4.22–5.81)

## 2013-06-15 LAB — URINALYSIS, ROUTINE W REFLEX MICROSCOPIC
Bilirubin Urine: NEGATIVE
Hgb urine dipstick: NEGATIVE
Ketones, ur: NEGATIVE mg/dL
Nitrite: NEGATIVE
Protein, ur: NEGATIVE mg/dL
Specific Gravity, Urine: 1.015 (ref 1.005–1.030)
Urobilinogen, UA: 1 mg/dL (ref 0.0–1.0)

## 2013-06-15 LAB — COMPREHENSIVE METABOLIC PANEL
Alkaline Phosphatase: 73 U/L (ref 39–117)
BUN: 12 mg/dL (ref 6–23)
CO2: 27 mEq/L (ref 19–32)
Creatinine, Ser: 1.17 mg/dL (ref 0.50–1.35)
GFR calc Af Amer: 82 mL/min — ABNORMAL LOW (ref >90)
GFR calc non Af Amer: 71 mL/min — ABNORMAL LOW (ref >90)
Glucose, Bld: 123 mg/dL — ABNORMAL HIGH (ref 70–99)
Potassium: 4 mEq/L (ref 3.5–5.1)
Total Bilirubin: 0.3 mg/dL (ref 0.3–1.2)
Total Protein: 6.8 g/dL (ref 6.0–8.3)

## 2013-06-15 LAB — LIPASE, BLOOD: Lipase: 62 U/L — ABNORMAL HIGH (ref 11–59)

## 2013-06-15 MED ORDER — HYDROMORPHONE HCL PF 1 MG/ML IJ SOLN
1.0000 mg | Freq: Once | INTRAMUSCULAR | Status: DC
  Filled 2013-06-15: qty 1

## 2013-06-15 MED ORDER — SODIUM CHLORIDE 0.9 % IV BOLUS (SEPSIS)
1000.0000 mL | Freq: Once | INTRAVENOUS | Status: AC
  Administered 2013-06-15: 1000 mL via INTRAVENOUS

## 2013-06-15 MED ORDER — OXYCODONE-ACETAMINOPHEN 5-325 MG PO TABS: 1.0000 | ORAL_TABLET | Freq: Four times a day (QID) | ORAL | Status: DC | PRN

## 2013-06-15 MED ORDER — HYDROMORPHONE HCL PF 1 MG/ML IJ SOLN
1.0000 mg | Freq: Once | INTRAMUSCULAR | Status: AC
  Administered 2013-06-15: 1 mg via INTRAVENOUS
  Filled 2013-06-15: qty 1

## 2013-06-15 NOTE — ED Notes (Signed)
PT IS UNABLE TO URINATE AT THIS TIME.  PT STATES THIS IS COMMON WITH HIS HX

## 2013-06-15 NOTE — ED Notes (Signed)
Patient transported to X-ray 

## 2013-06-15 NOTE — ED Notes (Signed)
Pt states hx of kidney stones.  C/o lt flank pain since last night.  States that he passed a stone this morning but is still having severe pain.

## 2013-06-15 NOTE — ED Provider Notes (Addendum)
CSN: [id number]     Arrival date & time 06/15/13  1243 History   First MD Initiated Contact with Patient 06/15/13 1327     Chief Complaint  Patient presents with  . Flank Pain   (Consider location/radiation/quality/duration/timing/severity/associated sxs/prior Treatment) Patient is a 52 y.o. male presenting with flank pain. The history is provided by the patient.  Flank Pain This is a recurrent problem. Pertinent negatives include no chest pain, no abdominal pain, no headaches and no shortness of breath.   patient with left flank pain and dysuria. He has a history of multiple previous kidney stones. He states he recently had one done in [place] and had a CT scan at that time. He states there were around for stones at that time. He states that he has passed one of those stones. He states the pain worse this morning. No Fevers. He has had some nausea. No vomiting. He set some difficulty urinating. No diarrhea or constipation. He's had a previous gastric bypass. He's had a bowel obstruction and perforated ulcer, however he states this feels like neither of those.  Past Medical History  Diagnosis Date  . Hyperlipidemia   . Abnormal LFTs     improved after gastic bypass  . History of concussion   . Gout   . Perforated ulcer     Surgical repair9/24 2011  . B12 nutritional deficiency   . Closed head injury     laceration  9 12  no loc  vision change  . History of renal stone     Seen in emergency room not urologist  . Diabetes mellitus     resolved with gastric bypass  . Chest pain     hopsitalized 2 14 evaluation  . OBESITY 12/10/2007    Qualifier: Diagnosis of  By: Fabian Sharp MD, Neta Mends Gastric bypass   . 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   . Sleep apnea     had gastric bypass/ sa-gone  . GERD (gastroesophageal reflux disease)     no meds  . DDD (degenerative  disc disease)    Past Surgical History  Procedure Laterality Date  . Vasectomy    . Nasal septoplasty w/ turbinoplasty      x 2  . Gastric bypass  11/06/09    beginning weight 267  . Epidural block injection      by Dr. Ethelene Hal  . Surgerical repair of stomach ulcer  06/08/10    per  . Hiatal hernia repair N/A 01/20/2013    Procedure: LAPAROSCOPIC REPAIR OF INTERNAL HERNIA;  Surgeon: Kandis Cocking, MD;  Location: [facility];  Service: General;  Laterality: N/A;  . Back surgery  2012    Dr. Delorse Limber disk  . Hernia repair  1998    umbilical   . Tonsillectomy    . Uvulopalatopharyngoplasty  2007    with tonsils  . Shoulder arthroscopy  2014    rt  . Carpal tunnel release  2008    left-ulnar nerve decomp  . Mass excision Right 04/18/2013    Procedure: RIGHT SMALL FINGER SKIN LESION EXCISION;  Surgeon: Jodi Marble, MD;  Location: [facility];  Service: Orthopedics;  Laterality: Right;   Family History  Problem Relation Age of Onset  . Uterine cancer Mother   . Hypertension Mother   . Cervical cancer Mother   .  Heart disease Father   . COPD Father    History  Substance Use Topics  . Smoking status: Never Smoker   . Smokeless tobacco: Never Used  . Alcohol Use: No    Review of Systems  Constitutional: Negative for activity change and appetite change.  HENT: Negative for neck stiffness.   Eyes: Negative for pain.  Respiratory: Negative for chest tightness and shortness of breath.   Cardiovascular: Negative for chest pain and leg swelling.  Gastrointestinal: Positive for nausea. Negative for vomiting, abdominal pain and diarrhea.  Genitourinary: Positive for frequency, flank pain and difficulty urinating.  Musculoskeletal: Negative for back pain.  Skin: Negative for rash.  Neurological: Negative for weakness, numbness and headaches.  Psychiatric/Behavioral: Negative for behavioral problems.    Allergies  Aspirin and Hydrocodone  Home Medications    Current Outpatient Rx  Name  Route  Sig  Dispense  Refill  . albuterol (PROVENTIL,VENTOLIN) 90 MCG/ACT inhaler   Inhalation   Inhale 2 puffs into the lungs every 6 (six) hours as needed. For shortness of breath.         [name] amitriptyline (ELAVIL) 50 MG tablet   Oral   Take 50 mg by mouth at bedtime as needed for sleep.         . B Complex-C (B-COMPLEX WITH VITAMIN C) tablet   Oral   Take 1 tablet by mouth daily.         . calcium citrate (CALCITRATE) 950 MG tablet   Oral   Take 1 tablet by mouth daily.          . Cholecalciferol 10000 UNITS CAPS   Oral   Take 1 capsule by mouth daily.         [name] escitalopram (LEXAPRO) 20 MG tablet   Oral   Take 20 mg by mouth daily.         . ferrous sulfate 325 (65 FE) MG tablet   Oral   Take 325 mg by mouth daily with breakfast.         . indomethacin (INDOCIN) 50 MG capsule   Oral   Take 50 mg by mouth 3 (three) times daily as needed (pain).         [name] lamoTRIgine (LAMICTAL) 200 MG tablet   Oral   Take 200 mg by mouth daily.         . methocarbamol (ROBAXIN) 750 MG tablet   Oral   Take 750 mg by mouth 4 (four) times daily as needed. Back pain.         . Multiple Vitamin (MULTIVITAMIN WITH MINERALS) TABS   Oral   Take 1 tablet by mouth daily.         . ondansetron (ZOFRAN-ODT) 4 MG disintegrating tablet   Oral   Take 1 tablet (4 mg total) by mouth every 8 (eight) hours as needed for nausea.   20 tablet   0   . oxyCODONE-acetaminophen (PERCOCET/ROXICET) 5-325 MG per tablet   Oral   Take 1 tablet by mouth every 4 (four) hours as needed (pain).   30 tablet   0   . tamsulosin (FLOMAX) 0.4 MG CAPS   Oral   Take 0.4 mg by mouth daily after breakfast.         . zolpidem (AMBIEN) 10 MG tablet   Oral   Take 10 mg by mouth at bedtime as needed. For sleep          BP 130/72  Pulse 72  Temp(Src) 98.6 F (37 C) (Oral)  Resp 16  SpO2 99% Physical Exam  Nursing note and vitals  reviewed. Constitutional: He is oriented to person, place, and time. He appears well-developed and well-nourished.  HENT:  Head: Normocephalic and atraumatic.  Eyes: EOM are normal. Pupils are equal, round, and reactive to light.  Neck: Normal range of motion. Neck supple.  Cardiovascular: Normal rate, regular rhythm and normal heart sounds.   No murmur heard. Pulmonary/Chest: Effort normal and breath sounds normal.  Abdominal: Soft. Bowel sounds are normal. He exhibits no distension and no mass. There is no tenderness. There is no rebound and no guarding.  Genitourinary:  CVA tenderness on left.  Musculoskeletal: Normal range of motion. He exhibits no edema.  Neurological: He is alert and oriented to person, place, and time. No cranial nerve deficit.  Skin: Skin is warm and dry.  Psychiatric: He has a normal mood and affect.    ED Course  Procedures (including critical care time) Labs Review Labs Reviewed  COMPREHENSIVE METABOLIC PANEL - Abnormal; Notable for the following:    Glucose, Bld 123 (*)    GFR calc non Af Amer 71 (*)    GFR calc Af Amer 82 (*)    All other components within normal limits  LIPASE, BLOOD - Abnormal; Notable for the following:    Lipase 62 (*)    All other components within normal limits  CBC WITH DIFFERENTIAL  URINALYSIS, ROUTINE W REFLEX MICROSCOPIC   Imaging Review Dg Abd 1 View  06/15/2013   CLINICAL DATA:  Left flank pain.  EXAM: ABDOMEN - 1 VIEW  COMPARISON:  Radiograph dated 04/05/2013  FINDINGS: There are small stones in the lower pole of the right kidney. There is a small phlebolith in the left side of the pelvis. No visible left renal calculi. No stones seen along the course of the ureters. Surgical bowel staples are seen in the left mid abdomen.  Chronic diastases of the symphysis pubis.  Bowel gas pattern is normal.  IMPRESSION: No acute abnormality. Right renal calculi.   Electronically Signed   By: Geanie Cooley   On: 06/15/2013 14:27     MDM  No diagnosis found. Patient was left flank pain. History of kidney stones. Lab work shows minimally elevated lipase. Urine does not show hematuria. Previous CTs have shown kidney stones. Will get ultrasound to evaluate for hydronephrosis. Patient is requesting pain meds for discharge.    Juliet Rude. Rubin Payor, MD 06/15/13 1608  Patient's ultrasound does not show hydronephrosis. He does not have hematuria. His lipase is minimally elevated. He does not appear to be obstructed this time. He feels better and will be discharged home to followup as needed.  Juliet Rude. Rubin Payor, MD 06/15/13 [phone]

## 2013-06-17 ENCOUNTER — Other Ambulatory Visit: Payer: Self-pay | Admitting: Family Medicine

## 2013-06-17 DIAGNOSIS — R739 Hyperglycemia, unspecified: Secondary | ICD-10-CM

## 2013-06-17 DIAGNOSIS — Z0000 Encounter for general adult medical examination without abnormal findings: Secondary | ICD-10-CM

## 2013-06-24 ENCOUNTER — Telehealth: Payer: Self-pay | Admitting: Internal Medicine

## 2013-06-24 MED ORDER — ZOLPIDEM TARTRATE 10 MG PO TABS: 10.0000 mg | ORAL_TABLET | Freq: Every evening | ORAL | Status: DC | PRN

## 2013-06-24 NOTE — Telephone Encounter (Signed)
Spoke to the pharmacy.  Carolynn Sayers, NP has been filling his Ambien prescription.  He tried to call their office but they are not in.  Please advise.  Thanks!!

## 2013-06-24 NOTE — Telephone Encounter (Signed)
I have not been prescribing ambien for him according to recent EHR  but am willing to help out if needed. COntact patient and or phramcy about prescribing provider   May be able to given small amount if cant get refll from them

## 2013-06-24 NOTE — Telephone Encounter (Signed)
Ok to do 14 only this time and future rx from his regular prescriber.

## 2013-06-24 NOTE — Telephone Encounter (Signed)
Pt is on vacation at [facility] and forgot his zolpidem (AMBIEN) 10 MG tablet.  Pt has been up all night. Would like to know if md will call in a 14 day to get him through til he gets home. Pt has called [employer] and they said they will fill a 14 day if MD would agree. Pt doesn't want to end vacation early, but will have to if he can't get his med.  [facility]  [address] [employer],  [phone]

## 2013-06-24 NOTE — Telephone Encounter (Signed)
Called and left on voicemail. 

## 2013-06-27 ENCOUNTER — Other Ambulatory Visit: Payer: Federal, State, Local not specified - PPO

## 2013-07-02 ENCOUNTER — Other Ambulatory Visit: Payer: Self-pay | Admitting: Internal Medicine

## 2013-07-04 ENCOUNTER — Ambulatory Visit: Payer: Federal, State, Local not specified - PPO | Admitting: Internal Medicine

## 2013-07-11 ENCOUNTER — Other Ambulatory Visit (INDEPENDENT_AMBULATORY_CARE_PROVIDER_SITE_OTHER): Payer: Federal, State, Local not specified - PPO

## 2013-07-11 DIAGNOSIS — Z0000 Encounter for general adult medical examination without abnormal findings: Secondary | ICD-10-CM

## 2013-07-11 DIAGNOSIS — R739 Hyperglycemia, unspecified: Secondary | ICD-10-CM

## 2013-07-11 DIAGNOSIS — R7309 Other abnormal glucose: Secondary | ICD-10-CM

## 2013-07-11 LAB — CBC WITH DIFFERENTIAL/PLATELET
Basophils Absolute: 0 10*3/uL (ref 0.0–0.1)
Basophils Relative: 0.3 % (ref 0.0–3.0)
Eosinophils Absolute: 0.1 10*3/uL (ref 0.0–0.7)
Hemoglobin: 14.4 g/dL (ref 13.0–17.0)
Lymphocytes Relative: 33 % (ref 12.0–46.0)
MCHC: 34.1 g/dL (ref 30.0–36.0)
Monocytes Relative: 4.7 % (ref 3.0–12.0)
Platelets: 149 10*3/uL — ABNORMAL LOW (ref 150.0–400.0)
RDW: 13.4 % (ref 11.5–14.6)

## 2013-07-11 LAB — LIPID PANEL
HDL: 55.5 mg/dL (ref >39.00)
Triglycerides: 74 mg/dL (ref 0.0–149.0)
VLDL: 14.8 mg/dL (ref 0.0–40.0)

## 2013-07-11 LAB — BASIC METABOLIC PANEL
BUN: 11 mg/dL (ref 6–23)
CO2: 29 mEq/L (ref 19–32)
Calcium: 8.9 mg/dL (ref 8.4–10.5)
Creatinine, Ser: 1.2 mg/dL (ref 0.4–1.5)
GFR: 65.11 mL/min (ref >60.00)
Glucose, Bld: 137 mg/dL — ABNORMAL HIGH (ref 70–99)

## 2013-07-11 LAB — HEPATIC FUNCTION PANEL
Albumin: 4.4 g/dL (ref 3.5–5.2)
Alkaline Phosphatase: 67 U/L (ref 39–117)
Bilirubin, Direct: 0.1 mg/dL (ref 0.0–0.3)
Total Protein: 6.7 g/dL (ref 6.0–8.3)

## 2013-07-11 LAB — TSH: TSH: 1.1 u[IU]/mL (ref 0.35–5.50)

## 2013-07-25 ENCOUNTER — Ambulatory Visit: Payer: Federal, State, Local not specified - PPO | Admitting: Internal Medicine

## 2013-07-29 ENCOUNTER — Encounter: Payer: Self-pay | Admitting: *Deleted

## 2013-08-01 ENCOUNTER — Encounter: Payer: Self-pay | Admitting: Internal Medicine

## 2013-08-01 ENCOUNTER — Ambulatory Visit: Payer: Federal, State, Local not specified - PPO | Admitting: Internal Medicine

## 2013-08-01 ENCOUNTER — Ambulatory Visit (INDEPENDENT_AMBULATORY_CARE_PROVIDER_SITE_OTHER): Payer: Federal, State, Local not specified - PPO | Admitting: Internal Medicine

## 2013-08-01 VITALS — BP 126/92 | HR 84 | Temp 98.1°F | Wt 186.0 lb

## 2013-08-01 DIAGNOSIS — Z87442 Personal history of urinary calculi: Secondary | ICD-10-CM | POA: Insufficient documentation

## 2013-08-01 DIAGNOSIS — E119 Type 2 diabetes mellitus without complications: Secondary | ICD-10-CM

## 2013-08-01 DIAGNOSIS — D696 Thrombocytopenia, unspecified: Secondary | ICD-10-CM

## 2013-08-01 DIAGNOSIS — Z23 Encounter for immunization: Secondary | ICD-10-CM

## 2013-08-01 DIAGNOSIS — Z9884 Bariatric surgery status: Secondary | ICD-10-CM

## 2013-08-01 MED ORDER — ONDANSETRON 4 MG PO TBDP: 4.0000 mg | ORAL_TABLET | Freq: Three times a day (TID) | ORAL | Status: DC | PRN

## 2013-08-01 NOTE — Patient Instructions (Signed)
Get  Your colon cancer screening .   Continue lifestyle intervention healthy eating and exercise . Labs are good.   6 months  hga 1c bmp cbc diff .

## 2013-08-01 NOTE — Progress Notes (Signed)
Chief Complaint  Patient presents with  . Follow-up    HPI: Patient comes in today for follow up of  multiple medical problems.  He continues to do well in regard to his weight loss and diabetes and blood pressure. He is off all diabetes medication doesn't need to use his CPAP machine because his sleep apnea is resolved. Or remitted. No major vision changes neuropathy. Had ed visit octob er 1  stpones let.  On flomax . Recurrent renal stones.   On road   With work.   School. Pretty busy still hasn't had a colonoscopy. ROS: See pertinent positives and negative as per history of present illness Mood medication Up on lamictal.  But feel stress. As the cause and doing okay Ortho; right shoulder .  Had been  on robaxin .  And was given topical.  Thinks it made it a bit worse.   Past Medical History  Diagnosis Date  . Hyperlipidemia   . Abnormal LFTs     improved after gastic bypass  . History of concussion   . Gout   . Perforated ulcer     Surgical repair9/24 2011  . B12 nutritional deficiency   . Closed head injury     laceration  9 12  no loc  vision change  . History of renal stone     Seen in emergency room not urologist  . Diabetes mellitus     resolved with gastric bypass  . Chest pain     hopsitalized 2 14 evaluation  . OBESITY 12/10/2007    Qualifier: Diagnosis of  By: Fabian Sharp MD, Neta Mends Gastric bypass   . 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   . Sleep apnea     had gastric bypass/ sa-gone  . GERD (gastroesophageal reflux disease)     no meds  . DDD (degenerative disc disease)   . OBSTRUCTIVE SLEEP APNEA 07/22/2007    Qualifier: Diagnosis of  By: Laural Benes RN, Cicero Duck    . DIABETES MELLITUS, TYPE II 04/06/2007    Qualifier: Diagnosis of  By: Fabian Sharp MD, Neta Mends Improved and off meds since gastric bypass     Family History  Problem Relation Age of  Onset  . Uterine cancer Mother   . Hypertension Mother   . Cervical cancer Mother   . Heart disease Father   . COPD Father     History   Social History  . Marital Status: Married    Spouse Name: N/A    Number of Children: N/A  . Years of Education: N/A   Social History Main Topics  . Smoking status: Never Smoker   . Smokeless tobacco: Never Used  . Alcohol Use: No  . Drug Use: No  . Sexual Activity: None   Other Topics Concern  . None   Social History Narrative   Married   Regular exercise-no   Employed [employer]   Now in school travels       10 hours work     Sleep  No longer needs  cpap                   Outpatient Encounter Prescriptions as of 08/01/2013  Medication Sig  . amitriptyline (ELAVIL) 50 MG tablet Take 50 mg by mouth at bedtime as needed for sleep.  . B Complex-C (  B-COMPLEX WITH VITAMIN C) tablet Take 1 tablet by mouth daily.  . calcium citrate (CALCITRATE) 950 MG tablet Take 1 tablet by mouth daily.   . Cholecalciferol 10000 UNITS CAPS Take 1 capsule by mouth daily.  [name] escitalopram (LEXAPRO) 20 MG tablet Take 20 mg by mouth daily.  . ferrous sulfate 325 (65 FE) MG tablet Take 325 mg by mouth daily with breakfast.  . indomethacin (INDOCIN) 50 MG capsule Take 50 mg by mouth 3 (three) times daily as needed (pain).  . indomethacin (INDOCIN) 50 MG capsule TAKE 1 CAPSULE BY MOUTH 3 TIMES A DAY AS NEEDED  . lamoTRIgine (LAMICTAL) 100 MG tablet Take 200mg  qam and 100mg  qhs.  . methocarbamol (ROBAXIN) 750 MG tablet Take 750 mg by mouth 4 (four) times daily as needed. Back pain.  . Multiple Vitamin (MULTIVITAMIN WITH MINERALS) TABS Take 1 tablet by mouth daily.  . ondansetron (ZOFRAN-ODT) 4 MG disintegrating tablet Take 1 tablet (4 mg total) by mouth every 8 (eight) hours as needed for nausea.  . tamsulosin (FLOMAX) 0.4 MG CAPS Take 0.4 mg by mouth daily after breakfast.  . zolpidem (AMBIEN) 10 MG tablet Take 1 tablet (10 mg total) by  mouth at bedtime as needed. For sleep  . [DISCONTINUED] ondansetron (ZOFRAN-ODT) 4 MG disintegrating tablet Take 1 tablet (4 mg total) by mouth every 8 (eight) hours as needed for nausea.  . [DISCONTINUED] albuterol (PROVENTIL,VENTOLIN) 90 MCG/ACT inhaler Inhale 2 puffs into the lungs every 6 (six) hours as needed. For shortness of breath.  . [DISCONTINUED] lamoTRIgine (LAMICTAL) 200 MG tablet Take 200 mg by mouth daily.  . [DISCONTINUED] oxyCODONE-acetaminophen (PERCOCET/ROXICET) 5-325 MG per tablet Take 1 tablet by mouth every 4 (four) hours as needed (pain).  . [DISCONTINUED] oxyCODONE-acetaminophen (PERCOCET/ROXICET) 5-325 MG per tablet Take 1-2 tablets by mouth every 6 (six) hours as needed for pain.    EXAM:  BP 126/92  Pulse 84  Temp(Src) 98.1 F (36.7 C) (Oral)  Wt 186 lb (84.369 kg)  SpO2 94%  Body mass index is 26.69 kg/(m^2).  GENERAL: vitals reviewed and listed above, alert, oriented, appears well hydrated and in no acute distress HEENT: atraumatic, conjunctiva  clear, no obvious abnormalities on inspection of external nose and ears OP : no lesion edema or exudate  NECK: no obvious masses on inspection palpation no adenopathy or JVD LUNGS: clear to auscultation bilaterally, no wheezes, rales or rhonchi, good air movement CV: HRRR, no clubbing cyanosis or  peripheral edema nl cap refill  Abdomen soft well-healed scars no guarding rebound or masses noted MS: moves all extremities without noticeable focal  abnormality PSYCH: pleasant and cooperative, no obvious depression or anxiety Skin no bruising or bleeding. Lab Results  Component Value Date   WBC 4.8 07/11/2013   HGB 14.4 07/11/2013   HCT 42.4 07/11/2013   PLT 149.0* 07/11/2013   GLUCOSE 137* 07/11/2013   CHOL 147 07/11/2013   TRIG 74.0 07/11/2013   HDL 55.50 07/11/2013   LDLDIRECT 113.2 04/01/2007   LDLCALC 77 07/11/2013   ALT 20 07/11/2013   AST 19 07/11/2013   NA 141 07/11/2013   K 3.5 07/11/2013   CL 104  07/11/2013   CREATININE 1.2 07/11/2013   BUN 11 07/11/2013   CO2 29 07/11/2013   TSH 1.10 07/11/2013   PSA 1.01 08/30/2012   INR 0.97 10/19/2012   HGBA1C 5.8 07/11/2013   MICROALBUR 0.7 05/11/2009    ASSESSMENT AND PLAN:  Discussed the following assessment and plan:  DIABETES MELLITUS,  TYPE II - Except for the fasting blood sugars pretty much resolved his A1c is much better and metabolic factors or improved  Bariatric surgery status  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 He is having recurrent renal stones on Flomax. Probably calcium but never analyzed. His obesity has resolved encouraged continued healthy lifestyle. Followup in 6 months with monitoring A1c. Still needs to get colon cancer screening. -Patient advised to return or notify health care team  if symptoms worsen or persist or new concerns arise.  Patient Instructions  Get  Your colon cancer screening .   Continue lifestyle intervention healthy eating and exercise . Labs are good.   6 months  hga 1c bmp cbc diff .   Neta Mends. Panosh M.D.

## 2013-08-09 ENCOUNTER — Other Ambulatory Visit: Payer: Self-pay | Admitting: Internal Medicine

## 2013-08-20 ENCOUNTER — Other Ambulatory Visit: Payer: Self-pay | Admitting: Internal Medicine

## 2013-08-22 NOTE — Telephone Encounter (Signed)
Does not look like you were the last to fill this medication.  Are you still the pre scriber?

## 2013-08-23 ENCOUNTER — Ambulatory Visit: Payer: Federal, State, Local not specified - PPO | Admitting: Internal Medicine

## 2013-08-23 NOTE — Telephone Encounter (Signed)
Ok to refill x 6 months  It seems that hospital took my name out to confuse the issue  Either way ok to rx as above

## 2013-10-20 ENCOUNTER — Ambulatory Visit (INDEPENDENT_AMBULATORY_CARE_PROVIDER_SITE_OTHER): Payer: Federal, State, Local not specified - PPO | Admitting: Surgery

## 2013-11-07 ENCOUNTER — Telehealth: Payer: Self-pay | Admitting: Family Medicine

## 2013-11-07 DIAGNOSIS — G4733 Obstructive sleep apnea (adult) (pediatric): Secondary | ICD-10-CM

## 2013-11-07 NOTE — Telephone Encounter (Signed)
Refer to dtr Dohmeir  Dx hx OSA  Dm

## 2013-11-07 NOTE — Telephone Encounter (Signed)
The patient's wife called to get a referral to [facility] for a sleep study.  Would like to see Dr. Maia Petties.  Pt's wife says the pt thinks his sleep apnea is coming back. Please advise.  Thanks!

## 2013-11-08 NOTE — Telephone Encounter (Signed)
Referral placed and requested MD specified in note

## 2013-11-10 ENCOUNTER — Other Ambulatory Visit: Payer: Self-pay | Admitting: Internal Medicine

## 2013-11-11 NOTE — Telephone Encounter (Signed)
Ok to refill x 1  

## 2013-11-12 ENCOUNTER — Telehealth: Payer: Self-pay | Admitting: *Deleted

## 2013-11-12 ENCOUNTER — Encounter: Payer: Self-pay | Admitting: *Deleted

## 2013-11-12 NOTE — Telephone Encounter (Signed)
Former pt of DR. G...td  

## 2013-11-15 ENCOUNTER — Other Ambulatory Visit: Payer: Self-pay | Admitting: Internal Medicine

## 2013-11-17 ENCOUNTER — Encounter (INDEPENDENT_AMBULATORY_CARE_PROVIDER_SITE_OTHER): Payer: Self-pay | Admitting: Surgery

## 2013-11-17 ENCOUNTER — Ambulatory Visit (INDEPENDENT_AMBULATORY_CARE_PROVIDER_SITE_OTHER): Payer: Federal, State, Local not specified - PPO | Admitting: Surgery

## 2013-11-17 VITALS — BP 148/90 | HR 71 | Temp 98.5°F | Resp 16 | Ht 70.0 in | Wt 197.2 lb

## 2013-11-17 DIAGNOSIS — Z9884 Bariatric surgery status: Secondary | ICD-10-CM | POA: Insufficient documentation

## 2013-11-17 NOTE — Progress Notes (Signed)
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  Is holding weight well.  Has labs in Epic from 07/08/2013 which are good.  He will see me in one year.  1B. Perforated ulcer    Repaired laparoscopically -  06/08/2010 -Hector [name] 1C. Internal hernia at the [facility]  Repaired 01/20/2013 at [facility] - Hector [name]  2. Diabetes mellitus - resolved with weight loss surgery  3. Gout   No further since weight loss  4. Question of sleep apnea - I though that this had resolved   But he is scheduled to see Dr. Maia [name] for an evaluation.   5. History of kidney stones  6. History of anxiety  7.  Having back issues  Seeing Hector [name]. Beane 8.  He was supposed to get colonoscopy by Hector [name] in June 2014.  But he missed this, so he needs to get it this year.   Chief Complaint   Patient presents with   .  Abdominal Pain    REFERRING PHYSICIAN: Dr. Noemi [name], [facility]   HISTORY OF PRESENT ILLNESS:  Hector [name] is a 53 y.o. (DOB: August 22, 1961) white male whose primary care physician is Hector Dawson, [name] and comes to me today for follow up of repairing an internal hernia at [facility] on 01/20/2013.   He is doing well.  No GI symptoms.  We talked about steroid dose packs and the risks of ulcer disease.  But I do not think he needs to be on prophylactic H2 blockers.  He is seeing Hector [name] and they have talked about steroids for his back.  I think he was on steroids when he had the stomach perforation.  Otherwise he is going to see me in Feb 2014, his RYGB anniversary.  History of internal hernia: He was doing well and leaving [facility] where his father is a patient for cardiac disease. He aite a burger and fries and developed LUQ abdominal pain that became severe. It was different than the perforation pain that he had in 2011. He did not feel nauseated or vomited. He is a little better, but  he has had pain meds. He has no history of liver disease, gall bladder disease, or colon disease. He did have an abdominal wall hernia repaired by Hector [name] which predated his RYGB. This was an onlay mesh.  He has been on Protonix since Feb when he had a workup for cardiac disease that proved negative.  CT scan, 12/21/2012, suggest SBO with bowel in RUQ. Reviewed case with Hector [name].   Past Medical History  Diagnosis Date  . Hyperlipidemia   . Abnormal LFTs     improved after gastic [name]  . History of concussion   . Gout   . Perforated ulcer     Surgical repair9/24 2011  . B12 nutritional deficiency   . Closed head injury     laceration  9 12  no loc  vision change  . History of renal stone     Seen in emergency room not urologist  . Diabetes mellitus     resolved with gastric [name]  . Chest pain     hopsitalized 2 14 evaluation  . OBESITY 12/10/2007    Qualifier: Diagnosis of  By: Hector [name], Hector [name]   .  ADJ DISORDER WITH MIXED ANXIETY & DEPRESSED MOOD 08/23/2009    Qualifier: Diagnosis of  By: Hector [name], Hector [name] Now on lamictal and lexapro and elevil for sleep.   . Sleep apnea     had gastric [name]/ sa-gone  . GERD (gastroesophageal reflux disease)     no meds  . DDD (degenerative disc disease)   . OBSTRUCTIVE SLEEP APNEA 07/22/2007    Qualifier: Diagnosis of  By: Hector [name], Hector [name]    . DIABETES MELLITUS, TYPE II 04/06/2007    Qualifier: Diagnosis of  By: Hector [name], Hector [name] Improved and off meds since gastric [name]      Current Outpatient Prescriptions  Medication Sig Dispense Refill  . amitriptyline (ELAVIL) 50 MG tablet Take 50 mg by mouth at bedtime as needed for sleep.      . B Complex-C (B-COMPLEX WITH VITAMIN C) tablet Take 1 tablet by mouth daily.      . calcium citrate (CALCITRATE) 950 MG tablet Take 1 tablet by mouth daily.       . Cholecalciferol 10000 UNITS CAPS Take 1 capsule by mouth daily.      [name] escitalopram (LEXAPRO) 20 MG tablet Take 20 mg  by mouth daily.      . ferrous sulfate 325 (65 FE) MG tablet Take 325 mg by mouth daily with breakfast.      . indomethacin (INDOCIN) 50 MG capsule Take 50 mg by mouth 3 (three) times daily as needed (pain).      . indomethacin (INDOCIN) 50 MG capsule TAKE 1 CAPSULE BY MOUTH 3 TIMES A DAY AS NEEDED  40 capsule  5  . lamoTRIgine (LAMICTAL) 100 MG tablet Take 200mg  qam and 100mg  qhs.      . Multiple Vitamin (MULTIVITAMIN WITH MINERALS) TABS Take 1 tablet by mouth daily.      . ondansetron (ZOFRAN-ODT) 4 MG disintegrating tablet TAKE 1 TABLET (4 MG TOTAL) BY MOUTH EVERY 8 (EIGHT) HOURS AS NEEDED FOR NAUSEA.  20 tablet  0  . tamsulosin (FLOMAX) 0.4 MG CAPS Take 0.4 mg by mouth daily after breakfast.      . zolpidem (AMBIEN) 10 MG tablet Take 1 tablet (10 mg total) by mouth at bedtime as needed. For sleep  14 tablet  0  . methocarbamol (ROBAXIN) 750 MG tablet Take 750 mg by mouth 4 (four) times daily as needed. Back pain.       No current facility-administered medications for this visit.    Allergies   Allergen  Reactions   .  Aspirin      REACTION: Pt had a allergic reaction as a perforated ulcer   .  Hydrocodone  Other (See Comments)     headache    REVIEW OF SYSTEMS:   Cardiac: No history of hypertension. No history of heart disease. Chest pain evaluated in Feb 2014 - which had a neg workup. He does not remember the cardiologist that saw him.  Pulmonary: Sleep apnea - resolved with weight loss.  Endocrine: Diabetes resolved. No thyroid disease.  Gastrointestinal: No history of liver disease. No history of gall bladder disease. No history of pancreas disease. No history of colon disease.  Urologic: History of kidney stones.  Musculoskeletal: Seeing Hector [name] for some back issues.   Hector [name] has operated on his right shoulder. Psycho-social: The patient is oriented. History of depression and anxiety.   SOCIAL and FAMILY HISTORY:  Married.  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  And he has a [place] of about 6 weeks that is a Firefighter.  PHYSICAL EXAM:  BP 148/90  Pulse 71  Temp(Src) 98.5 F (36.9 C) (Temporal)  Resp 16  Ht 5\' 10"  (1.778 m)  Wt 197 lb 3.2 oz (89.449 kg)  BMI 28.30 kg/m2  General: WN WM who is alert.  Abdomen: Soft. No mass.  Normal bowel sounds. Transverse abdominal scar at umbilicus.   Incisions look good.  BS present.  DATA REVIEWED:  None since hospitalization.   Alphonsa Overall, [name], [facility], [address] [place], [place]  Phone: [phone] FAX: [phone]

## 2013-12-12 ENCOUNTER — Ambulatory Visit (INDEPENDENT_AMBULATORY_CARE_PROVIDER_SITE_OTHER): Payer: Federal, State, Local not specified - PPO | Admitting: Neurology

## 2013-12-12 ENCOUNTER — Encounter: Payer: Self-pay | Admitting: Neurology

## 2013-12-12 ENCOUNTER — Other Ambulatory Visit: Payer: Self-pay | Admitting: Internal Medicine

## 2013-12-12 VITALS — BP 154/98 | HR 66 | Ht 71.0 in | Wt 194.0 lb

## 2013-12-12 DIAGNOSIS — G4733 Obstructive sleep apnea (adult) (pediatric): Secondary | ICD-10-CM | POA: Insufficient documentation

## 2013-12-12 HISTORY — DX: Obstructive sleep apnea (adult) (pediatric): G47.33

## 2013-12-12 NOTE — Progress Notes (Signed)
[facility]  Provider:  Larey [name], M D  Referring Provider: Burnis Medin, MD Primary Care Physician:  Hector Dawson, MD  Chief Complaint  Patient presents with  . New Evaluation    Room 11  . Sleep Apnea    HPI:  Hector [name] is a 53 y.o. male  Is seen here as a referral  from Dr. Regis [name] for a sleep apnea evaluation.  Hector [name] received the diagnosis of obstructive sleep apnea at a time when he was morbidly obese. He underwent a you have will palatoplasty which did not resolve his snoring, he had diabetes type 2, and according to his wife was loudly snoring independent of position. In Feb. 2011 he underwent a gastric bypass surgery after which he has reached a normal BMI and initially all symptoms of obstructive sleep apnea had resolved.  Over the last 6 months ,he has again developed snoring some nights and he developed again excessive daytime sleepiness and fatigue.  He had been using compliantly CPAP with a nasal pillow until his surgery.   It is not clear at this time, if he has torn he still obstructive sleep apnea or if a deficiency syndrome was induced after gastric bypass surgery . Fatigue and excessive daytime sleepiness may be a symptom of this.  She works as a Surveyor, mining. He normally goes to bed around 1900 hours and will falls asleep within 30 minutes if taking Ambien. He rises in the morning at 4:30, and he needs and relies on an alarm clock. He is not aware how many times at night he may be waking up and he has acknowledged that he has some memory deficits when taking Ambien. He vaguely remembers dreaming, and he has at times trouble to concentrate , daydreaming.  His work begins at 6:30 AM, he is physically active he is expulsed today light. He does not drink any caffeine in any form. He has never been a smoker and. He states that at work he sometimes has to fight the urge to sleep. He endorsed the Epworth Sleepiness Scale at 13 points and  the fatigue severity scale at 56 Points. He underwent a clinical neuropsychology test, and he was told not to have dementia.    Mother and father have OSA, CPAP user.       Review of Systems: Out of a complete 14 system review, the patient complains of only the following symptoms, and all other reviewed systems are negative. Epworth and FSS increased.  Memory loss, insomnia, treated with Ambien. Racing thoughts at night,  aching muscles some joint pain snoring and fatigue. Small bowel obstruction after Bypass , he has lost 100 pounds.  Tingling numbness in both hands, fingertips. Burning palms.   History   Social History  . Marital Status: Married    Spouse Name: Hector [name]    Number of Children: 2  . Years of Education: Associates   Occupational History  . RADAR TECH    Social History Main Topics  . Smoking status: Never Smoker   . Smokeless tobacco: Never Used  . Alcohol Use: Yes     Comment: occas.  . Drug Use: No  . Sexual Activity: Not on file   Other Topics Concern  . 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   Patient has two children.   Patient has a college education.   Regular exercise-no   Employed  law enforcement and investigation   Now in school travels       10 hours work     Sleep  No longer needs  cpap                      Family History  Problem Relation Age of Onset  . Uterine cancer Mother   . Hypertension Mother   . Cervical cancer Mother   . Heart disease Father   . COPD Father     Past Medical History  Diagnosis Date  . Hyperlipidemia   . Abnormal LFTs     improved after gastic bypass  . History of concussion   . Gout   . Perforated ulcer     Surgical repair9/24 2011  . B12 nutritional deficiency   . Closed head injury     laceration  9 12  no loc  vision change  . History of renal stone     Seen in emergency room not urologist  . Diabetes mellitus     resolved  with gastric bypass  . Chest pain     hopsitalized 2 14 evaluation  . OBESITY 12/10/2007    Qualifier: Diagnosis of  By: Hector Bill MD, Standley Brooking Gastric bypass   . ADJ DISORDER WITH MIXED ANXIETY & DEPRESSED MOOD 08/23/2009    Qualifier: Diagnosis of  By: Hector Bill MD, Standley Brooking Now on lamictal and lexapro and elevil for sleep.   . Sleep apnea     had gastric bypass/ sa-gone  . GERD (gastroesophageal reflux disease)     no meds  . DDD (degenerative disc disease)   . OBSTRUCTIVE SLEEP APNEA 07/22/2007    Qualifier: Diagnosis of  By: [name] RN, Doroteo Bradford    . DIABETES MELLITUS, TYPE II 04/06/2007    Qualifier: Diagnosis of  By: Hector Bill MD, Standley Brooking Improved and off meds since gastric bypass   . OSA (obstructive sleep apnea) 12/12/2013    Past Surgical History  Procedure Laterality Date  . Vasectomy    . Nasal septoplasty w/ turbinoplasty      x 2  . Gastric bypass  11/06/09    beginning weight 267  . Epidural block injection      by Dr. Nelva Bush  . Surgerical repair of stomach ulcer  06/08/10    per  . Hiatal hernia repair N/A 01/20/2013    Procedure: LAPAROSCOPIC REPAIR OF INTERNAL HERNIA;  Surgeon: Shann Medal, MD;  Location: [facility];  Service: General;  Laterality: N/A;  . Back surgery  2012    Dr. Dorina Hoyer disk  . Hernia repair  3976    umbilical   . Tonsillectomy    . Uvulopalatopharyngoplasty  2007    with tonsils  . Shoulder arthroscopy  2014    rt  . Carpal tunnel release  2008    left-ulnar nerve decomp  . Mass excision Right 04/18/2013    Procedure: RIGHT SMALL FINGER SKIN LESION EXCISION;  Surgeon: Jolyn Nap, MD;  Location: [place];  Service: Orthopedics;  Laterality: Right;    Current Outpatient Prescriptions  Medication Sig Dispense Refill  . amitriptyline (ELAVIL) 50 MG tablet Take 50 mg by mouth at bedtime as needed for sleep.      . B Complex-C (B-COMPLEX WITH VITAMIN C) tablet Take 1 tablet by mouth daily.      . calcium citrate (CALCITRATE) 950 MG  tablet Take 1 tablet by mouth daily.       [name]  Cholecalciferol 10000 UNITS CAPS Take 1 capsule by mouth daily.      [name] escitalopram (LEXAPRO) 20 MG tablet Take 20 mg by mouth daily.      . ferrous sulfate 325 (65 FE) MG tablet Take 325 mg by mouth daily with breakfast.      . indomethacin (INDOCIN) 50 MG capsule Take 50 mg by mouth 3 (three) times daily as needed (pain).      . indomethacin (INDOCIN) 50 MG capsule TAKE 1 CAPSULE BY MOUTH 3 TIMES A DAY AS NEEDED  40 capsule  5  . lamoTRIgine (LAMICTAL) 100 MG tablet Take 200mg  qam and 100mg  qhs.      . methocarbamol (ROBAXIN) 750 MG tablet Take 750 mg by mouth 4 (four) times daily as needed. Back pain.      . Multiple Vitamin (MULTIVITAMIN WITH MINERALS) TABS Take 1 tablet by mouth daily.      . ondansetron (ZOFRAN-ODT) 4 MG disintegrating tablet TAKE 1 TABLET (4 MG TOTAL) BY MOUTH EVERY 8 (EIGHT) HOURS AS NEEDED FOR NAUSEA.  20 tablet  0  . oxyCODONE-acetaminophen (PERCOCET/ROXICET) 5-325 MG per tablet As needed      . tamsulosin (FLOMAX) 0.4 MG CAPS Take 0.4 mg by mouth daily after breakfast.      . zolpidem (AMBIEN) 10 MG tablet Take 1 tablet (10 mg total) by mouth at bedtime as needed. For sleep  14 tablet  0   No current facility-administered medications for this visit.    Allergies as of 12/12/2013 - Review Complete 12/12/2013  Allergen Reaction Noted  . Aspirin    . Hydrocodone Other (See Comments) 09/27/2012    Vitals: BP 154/98  Pulse 66  Ht 5\' 11"  (1.803 m)  Wt 194 lb (87.998 kg)  BMI 27.07 kg/m2 Last Weight:  Wt Readings from Last 1 Encounters:  12/12/13 194 lb (87.998 kg)   Last Height:   Ht Readings from Last 1 Encounters:  12/12/13 5\' 11"  (1.803 m)    Physical exam:  General: The patient is awake, alert and appears not in acute distress. The patient is well groomed. Head: Normocephalic, atraumatic. Neck is supple. Mallampati- X status post UPPP , neck circumference: 16 , from 20 inches before bypass. No TMJ, status  post UPPP  Cardiovascular:  Regular rate and rhythm, without  murmurs or carotid bruit, and without distended neck veins. Respiratory: Lungs are clear to auscultation. Skin:  Without evidence of edema, or rash Trunk: BMI is elevated and patient  has normal posture.  Neurologic exam : The patient is awake and alert, oriented to place and time.  Memory subjective described as intact.  There is a normal attention span & concentration ability. Speech is fluent without dysarthria, dysphonia or aphasia.  Mood and affect are appropriate.  Cranial nerves: Pupils are equal and briskly reactive to light. Funduscopic exam without  evidence of pallor or edema.  Extraocular movements  in vertical and horizontal planes intact and without nystagmus. Visual fields by finger perimetry are intact. Hearing to finger rub intact.  Facial sensation intact to fine touch. Facial motor strength is symmetric and tongue and uvula move midline.  Motor exam:    Normal tone and normal muscle bulk and symmetric normal strength in all extremities.  Sensory:  Fine touch, pinprick and vibration were tested in all extremities. Proprioception is tested in the upper extremities only. This was  normal.  Coordination: Rapid alternating movements in the fingers/hands is tested and normal.  Finger-to-nose maneuver tested and  normal without evidence of ataxia, dysmetria or tremor.  Gait and station: Patient walks without assistive device and is able and assisted stool climb up to the exam table.  Strength within normal limits. Stance is stable and normal. Tandem gait is unfragmented. Romberg testing is normal.  Deep tendon reflexes: in the  upper and lower extremities are symmetric and intact. Babinski maneuver response is  downgoing.   Assessment:  After physical and neurologic examination, review of laboratory studies, imaging, neurophysiology testing and pre-existing records, assessment is   Dr. Regis [name] has checked Vitamin B  12 and Copper, Sed rate, ANA and he is followed by Dr. Beryle Beams for von Willebrand's disease. There have been no dificiencies found.  He has sleepiness and fatigue ,  EDS at 13 points. Snoring after UPPP.  He has diffuse pain compliants and sees Dr. Tonita Cong , orthopedist. shoulder , right and left.   He would like a second opinion regarding cervical pain and radiculopathy.  He has had ulnar and median nerve releases.     Plan:  Treatment plan and additional workup : SPLIT at 15  AHI, 3% ,  Status post UPP, not a candidate for HST or auto-titration.  This patient may need Ambien to bring to the sleep lab.

## 2013-12-12 NOTE — Patient Instructions (Signed)

## 2013-12-15 ENCOUNTER — Ambulatory Visit (INDEPENDENT_AMBULATORY_CARE_PROVIDER_SITE_OTHER): Payer: Federal, State, Local not specified - PPO | Admitting: Neurology

## 2013-12-15 DIAGNOSIS — G4733 Obstructive sleep apnea (adult) (pediatric): Secondary | ICD-10-CM

## 2013-12-27 ENCOUNTER — Telehealth: Payer: Self-pay | Admitting: Neurology

## 2013-12-27 NOTE — Telephone Encounter (Signed)
I called and spoke with the patient about his recent sleep study results. I informed the patient that the study revealed Hypoxemia and mild, positional dependent. Complex sleep apnea and that Dr. Brett Fairy recommend CPAP therapy. Patient understood, but wants to know if can have Auto titration instead of coming back into the lab. I informed the patient that I will check with Dr. Brett Fairy and call him back. I will fax a copy of the report to Dr. Mariann Laster Panosh's office and mail a copy to the patient.

## 2013-12-28 ENCOUNTER — Encounter: Payer: Self-pay | Admitting: *Deleted

## 2013-12-28 ENCOUNTER — Other Ambulatory Visit: Payer: Self-pay | Admitting: *Deleted

## 2013-12-28 DIAGNOSIS — G4733 Obstructive sleep apnea (adult) (pediatric): Secondary | ICD-10-CM

## 2013-12-28 NOTE — Telephone Encounter (Signed)
I called and spoke with the patient about scheduling his CPAP titration study, since Dr. Brett Fairy called him on 12-27-13 to discuss why he needs to have the CPAP titration study. Patient has been scheduled.

## 2014-01-05 ENCOUNTER — Ambulatory Visit (INDEPENDENT_AMBULATORY_CARE_PROVIDER_SITE_OTHER): Payer: Federal, State, Local not specified - PPO | Admitting: Neurology

## 2014-01-05 ENCOUNTER — Telehealth: Payer: Self-pay | Admitting: *Deleted

## 2014-01-05 DIAGNOSIS — G4733 Obstructive sleep apnea (adult) (pediatric): Secondary | ICD-10-CM

## 2014-01-05 NOTE — Telephone Encounter (Signed)
Patient calls in preparation for CPAP Titration study tonight.  He is using Ambien for sleep and has done so for a while now.  He used it during his diagnostic sleep study.  He and his wife have done lots of research on Ambien and feel that it's possible it could be contributing to some of his daytime sleepiness.  They have discussed having him wean from Ambien in the future.  However he wanted to confirm whether or not he should take it tonight, he adds he thinks he shouldn't change too many things at once.  I agreed it was best to take it tonight and then a step by step approach can be undertaken in the future to wean from Ambien and see how he feels.  I explained that if he is used to taking it every single night, if he goes without it tonight (on the night of CPAP Titration in the sleep lab with wires and sensors in a different environment), he will likely not sleep.

## 2014-01-09 ENCOUNTER — Other Ambulatory Visit: Payer: Federal, State, Local not specified - PPO

## 2014-01-16 ENCOUNTER — Telehealth: Payer: Self-pay | Admitting: Neurology

## 2014-01-16 ENCOUNTER — Ambulatory Visit: Payer: Federal, State, Local not specified - PPO | Admitting: Internal Medicine

## 2014-01-16 ENCOUNTER — Encounter: Payer: Self-pay | Admitting: *Deleted

## 2014-01-16 DIAGNOSIS — G4733 Obstructive sleep apnea (adult) (pediatric): Secondary | ICD-10-CM

## 2014-01-16 NOTE — Telephone Encounter (Signed)
I called and spoke with the patient about his recent CPAP titration study results. I informed the patient that he did well on CPAP during his recent study with significant improvement of respiratory events. I informed the patient that Dr. Brett Fairy recommend starting CPAP therapy at home and so I will fax the order to [facility] and they'll contact him. I will fax a copy of the report to Dr. Velora Mediate office and mail a copy of the report to the patient along with a follow up instruction letter.

## 2014-01-18 ENCOUNTER — Ambulatory Visit: Payer: Federal, State, Local not specified - PPO | Admitting: Oncology

## 2014-02-06 ENCOUNTER — Other Ambulatory Visit: Payer: Self-pay | Admitting: Internal Medicine

## 2014-02-07 ENCOUNTER — Encounter: Payer: Self-pay | Admitting: Internal Medicine

## 2014-02-07 ENCOUNTER — Ambulatory Visit (INDEPENDENT_AMBULATORY_CARE_PROVIDER_SITE_OTHER): Payer: Federal, State, Local not specified - PPO | Admitting: Internal Medicine

## 2014-02-07 VITALS — BP 144/86 | Temp 99.0°F | Ht 69.5 in | Wt 199.0 lb

## 2014-02-07 DIAGNOSIS — T6391XA Toxic effect of contact with unspecified venomous animal, accidental (unintentional), initial encounter: Secondary | ICD-10-CM

## 2014-02-07 DIAGNOSIS — T63391A Toxic effect of venom of other spider, accidental (unintentional), initial encounter: Secondary | ICD-10-CM

## 2014-02-07 DIAGNOSIS — T63301A Toxic effect of unspecified spider venom, accidental (unintentional), initial encounter: Secondary | ICD-10-CM

## 2014-02-07 MED ORDER — DOXYCYCLINE HYCLATE 100 MG PO CAPS: 100.0000 mg | ORAL_CAPSULE | Freq: Two times a day (BID) | ORAL | Status: DC

## 2014-02-07 NOTE — Progress Notes (Signed)
Chief Complaint  Patient presents with  . Insect Bite    Pt thinks it is a spider bite.  Found  a spider.  First noticed on Saturday after lunch    HPI: Patient comes in today for SDA for  new problem evaluation. Is getting her presentation and had a sudden onset of a stinging 3 days ago. Felt to be a spider bite could have been a brown recluse. Under his shirt in the back. Wife took pictures of it and saw that it was enlarging and was encouraged to come in to get checked no fever but has some low-grade feeling of not feeling well. No specific treatment didn't feel need to go to the ER at the beginning area is enlarging but not rapidly. ROS: See pertinent positives and negatives per HPI.  Past Medical History  Diagnosis Date  . Hyperlipidemia   . Abnormal LFTs     improved after gastic bypass  . History of concussion   . Gout   . Perforated ulcer     Surgical repair9/24 2011  . B12 nutritional deficiency   . Closed head injury     laceration  9 12  no loc  vision change  . History of renal stone     Seen in emergency room not urologist  . Diabetes mellitus     resolved with gastric bypass  . Chest pain     hopsitalized 2 14 evaluation  . OBESITY 12/10/2007    Qualifier: Diagnosis of  By: [name] MD, Standley Brooking Gastric bypass   . 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   . Sleep apnea     had gastric bypass/ sa-gone  . GERD (gastroesophageal reflux disease)     no meds  . DDD (degenerative disc disease)   . OBSTRUCTIVE SLEEP APNEA 07/22/2007    Qualifier: Diagnosis of  By: [name] RN, Doroteo Bradford    . DIABETES MELLITUS, TYPE II 04/06/2007    Qualifier: Diagnosis of  By: [name] MD, Standley Brooking Improved and off meds since gastric bypass   . OSA (obstructive sleep apnea) 12/12/2013    Family History  Problem Relation Age of Onset  . Uterine cancer Mother   .  Hypertension Mother   . Cervical cancer Mother   . Heart disease Father   . COPD Father     History   Social History  . Marital Status: Married    Spouse Name: Janine    Number of Children: 2  . Years of Education: Associates   Occupational History  . RADAR TECH    Social History Main Topics  . Smoking status: Never Smoker   . Smokeless tobacco: Never Used  . Alcohol Use: Yes     Comment: occas.  . Drug Use: No  . Sexual Activity: None   Other Topics Concern  . 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   Patient has two children.   Patient has a college education.   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 amitriptyline (ELAVIL) 50 MG tablet Take 50 mg by mouth at bedtime as needed for sleep.  [name] amitriptyline (ELAVIL) 50 MG tablet TAKE 1 TABLET AS NEEDED FOR SLEEP  . B Complex-C (B-COMPLEX WITH VITAMIN C) tablet Take 1 tablet by mouth daily.  . calcium citrate (CALCITRATE) 950 MG tablet Take 1 tablet by mouth daily.   . Cholecalciferol 10000 UNITS CAPS Take 1 capsule by mouth daily.  [name] escitalopram (LEXAPRO) 20 MG tablet Take 20 mg by mouth daily.  . ferrous sulfate 325 (65 FE) MG tablet Take 325 mg by mouth daily with breakfast.  . indomethacin (INDOCIN) 50 MG capsule Take 50 mg by mouth 3 (three) times daily as needed (pain).  . indomethacin (INDOCIN) 50 MG capsule TAKE 1 CAPSULE BY MOUTH 3 TIMES A DAY AS NEEDED  . lamoTRIgine (LAMICTAL) 100 MG tablet Take 200mg  qam and 100mg  qhs.  . methocarbamol (ROBAXIN) 750 MG tablet Take 750 mg by mouth 4 (four) times daily as needed. Back pain.  . Multiple Vitamin (MULTIVITAMIN WITH MINERALS) TABS Take 1 tablet by mouth daily.  . tamsulosin (FLOMAX) 0.4 MG CAPS Take 0.4 mg by  mouth daily after breakfast.  . zolpidem (AMBIEN) 10 MG tablet Take 1 tablet (10 mg total) by mouth at bedtime as needed. For sleep  . [DISCONTINUED] ondansetron (ZOFRAN-ODT) 4 MG disintegrating tablet TAKE 1 TABLET (4 MG TOTAL) BY MOUTH EVERY 8 (EIGHT) HOURS AS NEEDED FOR NAUSEA.  [name] doxycycline (VIBRAMYCIN) 100 MG capsule Take 1 capsule (100 mg total) by mouth 2 (two) times daily.  . [DISCONTINUED] oxyCODONE-acetaminophen (PERCOCET/ROXICET) 5-325 MG per tablet As needed    EXAM:  BP 144/86  Temp(Src) 99 F (37.2 C) (Oral)  Ht 5' 9.5" (1.765 m)  Wt 199 lb (90.266 kg)  BMI 28.98 kg/m2  Body mass index is 28.98 kg/(m^2).  GENERAL: vitals reviewed and listed above, alert, oriented, appears well hydrated and in no acute distress HEENT: atraumatic, conjunctiva  clear, no obvious abnormalities on inspection of external nose and earsNECK: no obvious masses on inspection palpation  Back shows a couple acne a form rashes but a central 3-4 cm erythematous mildly indurated red lesion with 3 mm central darkening like a scab. No fluctuance or abscess formation. No vesicles. MS: moves all extremities without noticeable focal  abnormality PSYCH: pleasant and cooperative, no obvious depression or anxiety  ASSESSMENT AND PLAN:  Discussed the following assessment and plan:  Spider bite presumed - No significant systemic symptoms doesn't look like a staph abscess but we'll cover with doxycycline observation local care. TD up to date  -Patient advised to return or notify health care team  if symptoms worsen ,persist or new concerns arise.  Patient Instructions  Begin antibiotic   For a week  Take with plenty of water.  Continue to monitor   .  If high fever or  worsening    Contact us.  Or emergency care area.       Standley Brooking. Nga Rabon M.D.  Pre visit review using our clinic review tool, if applicable. No additional management support is needed unless otherwise documented below in the visit  note.

## 2014-02-07 NOTE — Patient Instructions (Addendum)
Begin antibiotic   For a week  Take with plenty of water.  Continue to monitor   .  If high fever or  worsening    Contact us.  Or emergency care area.

## 2014-02-08 ENCOUNTER — Encounter: Payer: Self-pay | Admitting: Gastroenterology

## 2014-02-14 ENCOUNTER — Encounter: Payer: Self-pay | Admitting: Neurology

## 2014-02-21 ENCOUNTER — Encounter: Payer: Self-pay | Admitting: Neurology

## 2014-02-21 ENCOUNTER — Ambulatory Visit (INDEPENDENT_AMBULATORY_CARE_PROVIDER_SITE_OTHER): Payer: Federal, State, Local not specified - PPO | Admitting: Neurology

## 2014-02-21 VITALS — BP 149/94 | HR 82 | Resp 16 | Ht 70.0 in | Wt 204.0 lb

## 2014-02-21 DIAGNOSIS — Z9889 Other specified postprocedural states: Secondary | ICD-10-CM

## 2014-02-21 DIAGNOSIS — G4731 Primary central sleep apnea: Secondary | ICD-10-CM

## 2014-02-21 DIAGNOSIS — R0902 Hypoxemia: Secondary | ICD-10-CM

## 2014-02-21 DIAGNOSIS — G4730 Sleep apnea, unspecified: Secondary | ICD-10-CM

## 2014-02-21 NOTE — Progress Notes (Signed)
[facility]  Provider:  Larey Seat, [place] D  Referring Provider: Burnis Medin, MD Primary Care Physician:  Lottie Dawson, MD  Sleep medicine Follow up.   HPI:  Hector [name] is a 53 y.o. male  Is seen here as a revisit after referral  from Dr. [name] for a sleep apnea evaluation.  When a polysomnography on 4-to-15. He has a past medical history of status post UPPP, noncardiac angina, diabetes mellitus, renal stones, duodenal ulcers, postconcussion syndrome and gout. He also is under has undergone a gastric bypass surgery which reduced his body mass index significantly. To his weight loss surgery he had been diagnosed with obstructive sleep apnea. He endorsed the Epworth sleepiness score at 13 of 24 pounds. The patient AHI was 10.9 and then in study the RDI 11.1 on in supine sleep that he have a significant apnea index of 87.6. More concerning was that the patient in spite of the rather mild apnea had low oxygen levels,  but persisted through the sleep study.  I recommended a reduction in the respiratory rate of narcotic medications affording a better respiratory response.  In addition,  I recommended ONO with CPAP still if that can correct the hypoxemia in the setting of complex and central apnea.  The patient's CPAP Donald's are no available from 01/23/2014 through [id number]. The patient still has a very high AHI on this download of 11.4. This consists of 11.1 apneas. This pressure was 10 cm water with 3 cm EPR and the patient is highly compliant using his machine 45 hours 59 minutes nightly. He is an 87% compliance.  Based on this I am tempted to put the patient on  AUTO -titration,  between  5 and 12 cm allowing more of day by day variation of the pressure needed. I also would like an overnight pulse oximetry performed while the patient uses his upper.  In addition the patient and daughter today the fatigue severity scale of 25 points, the Epworth  sleepiness score only at 6/24 points -which is an improvement. The patient prefers the lateral sleep position, on his right. He still works night shifts, his download showed no major air leak influence on his AHI.  He is willing to use the tennis ball methode to reduce any inadvertent time on the back.    Last visit note : CD Mr. Longman received the diagnosis of obstructive sleep apnea at a time when he was morbidly obese. He underwent a you have will palatoplasty which did not resolve his snoring, he had diabetes type 2, and according to his wife was loudly snoring independent of position. In Feb. 2011 he underwent a gastric bypass surgery after which he has reached a normal BMI and initially all symptoms of obstructive sleep apnea had resolved.  Over the last 6 months, he has again developed snoring some nights and he developed again excessive daytime sleepiness and fatigue. He had been using compliantly CPAP with a nasal pillow until his surgery.   It is not clear at this time, if he has torn he still obstructive sleep apnea or if a deficiency syndrome was induced after gastric bypass surgery . Fatigue and excessive daytime sleepiness may be a symptom of this. He  works as a Surveyor, mining. He normally goes to bed around 19.00 hours and will falls asleep within 30 minutes- if taking Ambien.  He rises in the morning at 4:30, and he needs and relies on an alarm clock.  He is not aware how many times at night he may be waking up and he has acknowledged that he has some memory deficits when taking Ambien. He vaguely remembers dreaming, and he has at times trouble to concentrate , daydreaming.  His work begins at 6:30 AM, he is physically active he is expulsed today light. He does not drink any caffeine in any form. He has never been a smoker and. He states that at work he sometimes has to fight the urge to sleep. He endorsed the Epworth Sleepiness Scale at 13 points and the fatigue severity scale at 56  Points. He underwent a clinical neuropsychology test, and he was told not to have dementia.   Mother and father have OSA, CPAP user.    Review of Systems: Out of a complete 14 system review, the patient complains of only the following symptoms, and all other reviewed systems are negative. Epworth  now 6 and FSS decreased to 25 on CPAP 10cm water , compliant user.   .   Mild Memory loss, insomnia, treated with Ambien. Racing thoughts at night,  aching muscles some joint pain snoring and fatigue. PTSD?  Small bowel obstruction after Bypass , he has lost 100 pounds.   Tingling numbness in both hands, fingertips. Burning palms.   History   Social History  . Marital Status: Married    Spouse Name: Hector [name]    Number of Children: 2  . Years of Education: Associates   Occupational History  . RADAR TECH    Social History Main Topics  . Smoking status: Never Smoker   . Smokeless tobacco: Never Used  . Alcohol Use: Yes     Comment: occas.  . Drug Use: No  . Sexual Activity: Not on file   Other Topics Concern  . 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   Patient has two children.   Patient has a college education.   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   Patient drinks one soda daily.                         Family History  Problem Relation Age of Onset  . Uterine cancer Mother   . Hypertension Mother   . Cervical cancer Mother   . Heart disease Father   . COPD Father     Past Medical History  Diagnosis Date  . Hyperlipidemia   . Abnormal LFTs     improved after gastic bypass  . History of concussion   . Gout   . Perforated ulcer     Surgical repair9/24 2011  . B12 nutritional deficiency   . Closed head injury     laceration  9 12  no loc  vision change  . History  of renal stone     Seen in emergency room not urologist  . Diabetes mellitus     resolved with gastric bypass  . Chest pain     hopsitalized 2 14 evaluation  . OBESITY 12/10/2007    Qualifier: Diagnosis of  By: [name] MD, Standley Brooking Gastric bypass   . ADJ DISORDER WITH MIXED ANXIETY & DEPRESSED MOOD 08/23/2009    Qualifier: Diagnosis of  ByRegis Bill MD, Standley Brooking Now on lamictal and lexapro and elevil for sleep.   . Sleep apnea     had gastric bypass/ sa-gone  . GERD (gastroesophageal reflux disease)     no meds  . DDD (degenerative disc disease)   . OBSTRUCTIVE SLEEP APNEA 07/22/2007    Qualifier: Diagnosis of  By: [name] RN, Doroteo Bradford    . DIABETES MELLITUS, TYPE II 04/06/2007    Qualifier: Diagnosis of  By: [name] MD, Standley Brooking Improved and off meds since gastric bypass   . OSA (obstructive sleep apnea) 12/12/2013    Past Surgical History  Procedure Laterality Date  . Vasectomy    . Nasal septoplasty w/ turbinoplasty      x 2  . Gastric bypass  11/06/09    beginning weight 267  . Epidural block injection      by Dr. Nelva Bush  . Surgerical repair of stomach ulcer  06/08/10    per  . Hiatal hernia repair N/A 01/20/2013    Procedure: LAPAROSCOPIC REPAIR OF INTERNAL HERNIA;  Surgeon: Shann Medal, MD;  Location: [facility];  Service: General;  Laterality: N/A;  . Back surgery  2012    Dr. Dorina Hoyer disk  . Hernia repair  2440    umbilical   . Tonsillectomy    . Uvulopalatopharyngoplasty  2007    with tonsils  . Shoulder arthroscopy  2014    rt  . Carpal tunnel release  2008    left-ulnar nerve decomp  . Mass excision Right 04/18/2013    Procedure: RIGHT SMALL FINGER SKIN LESION EXCISION;  Surgeon: Jolyn Nap, MD;  Location: [place];  Service: Orthopedics;  Laterality: Right;    Current Outpatient Prescriptions  Medication Sig Dispense Refill  . amitriptyline (ELAVIL) 50 MG tablet Take 50 mg by mouth at bedtime as needed for sleep.      [name] amitriptyline (ELAVIL) 50 MG  tablet TAKE 1 TABLET AS NEEDED FOR SLEEP  90 tablet  0  . B Complex-C (B-COMPLEX WITH VITAMIN C) tablet Take 1 tablet by mouth daily.      . calcium citrate (CALCITRATE) 950 MG tablet Take 1 tablet by mouth daily.       . Cholecalciferol 10000 UNITS CAPS Take 1 capsule by mouth daily.      [name] doxycycline (VIBRAMYCIN) 100 MG capsule Take 1 capsule (100 mg total) by mouth 2 (two) times daily.  14 capsule  0  . escitalopram (LEXAPRO) 20 MG tablet Take 20 mg by mouth daily.      . ferrous sulfate 325 (65 FE) MG tablet Take 325 mg by mouth daily with breakfast.      . indomethacin (INDOCIN) 50 MG capsule Take 50 mg by mouth 3 (three) times daily as needed (pain).      . indomethacin (INDOCIN) 50 MG capsule TAKE 1 CAPSULE BY MOUTH 3 TIMES A DAY AS NEEDED  40 capsule  4  . lamoTRIgine (LAMICTAL) 100 MG tablet Take 200mg  qam and 100mg  qhs.      . methocarbamol (ROBAXIN) 750 MG tablet Take 750 mg by mouth 4 (four) times daily as needed. Back pain.      . Multiple Vitamin (MULTIVITAMIN WITH MINERALS) TABS Take 1 tablet by mouth daily.      . ondansetron (ZOFRAN-ODT) 4 MG disintegrating tablet TAKE 1 TABLET (4 MG TOTAL) BY MOUTH EVERY 8 (EIGHT) HOURS AS NEEDED FOR NAUSEA.  20 tablet  0  . tamsulosin (FLOMAX) 0.4  MG CAPS Take 0.4 mg by mouth daily after breakfast.      . zolpidem (AMBIEN) 10 MG tablet Take 1 tablet (10 mg total) by mouth at bedtime as needed. For sleep  14 tablet  0   No current facility-administered medications for this visit.    Allergies as of 02/21/2014 - Review Complete 02/21/2014  Allergen Reaction Noted  . Aspirin    . Hydrocodone Other (See Comments) 09/27/2012    Vitals: BP 149/94  Pulse 82  Resp 16  Ht 5\' 10"  (1.778 m)  Wt 204 lb (92.534 kg)  BMI 29.27 kg/m2 Last Weight:  Wt Readings from Last 1 Encounters:  02/21/14 204 lb (92.534 kg)   Last Height:   Ht Readings from Last 1 Encounters:  02/21/14 5\' 10"  (1.778 m)    Physical exam:  General: The patient is  awake, alert and appears not in acute distress. The patient is well groomed. Head: Normocephalic, atraumatic. Neck is supple. Mallampati- X status post UPPP , neck circumference: 16 , from 20 inches before bypass. No TMJ, status post UPPP  Cardiovascular:  Regular rate and rhythm, without  murmurs or carotid bruit, and without distended neck veins. Respiratory: Lungs are clear to auscultation. Skin:  Without evidence of edema, or rash Trunk: BMI is elevated and patient  has normal posture.  Neurologic exam : The patient is awake and alert, oriented to place and time.  Memory subjective described as intact.  There is a normal attention span & concentration ability. Speech is fluent without dysarthria, dysphonia or aphasia.  Mood and affect are appropriate.  Cranial nerves: Pupils are equal and briskly reactive to light. Funduscopic exam without  evidence of pallor or edema.  Extraocular movements  in vertical and horizontal planes intact and without nystagmus. Visual fields by finger perimetry are intact. Hearing to finger rub intact.  Facial sensation intact to fine touch. Facial motor strength is symmetric and tongue and uvula move midline.  Motor exam:    Normal tone and normal muscle bulk and symmetric normal strength in all extremities.  Sensory:  Fine touch, pinprick and vibration were tested in all extremities. Proprioception is tested in the upper extremities only. This was  normal.  Coordination: Rapid alternating movements in the fingers/hands is tested and normal.  Finger-to-nose maneuver tested and normal without evidence of ataxia, dysmetria or tremor.  Gait and station: Patient walks without assistive device and is able and assisted stool climb up to the exam table.  Strength within normal limits. Stance is stable and normal. Tandem gait is unfragmented. Romberg testing is normal.  Deep tendon reflexes: in the  upper and lower extremities are symmetric and intact. Babinski  maneuver response is  downgoing.   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 Beryle Beams for von Willebrand's disease. There have been no dificiencies found.  He has less  sleepiness and fatigue , less Snoring on CPP .   Now reduced sleepiness and fatigued , but residual AHI is very high 11.4, higher than in his PSG- I will change CPAP to autotitration ,  5-12 cm water. ONO on autotitrator.   He has diffuse pain compliants and sees Dr. Tonita Cong , orthopedist. shoulder , right and left.  He would like a second opinion  regarding cervical pain and radiculopathy. He has had ulnar and median nerve releases.     Plan:  Treatment plan and additional workup : This patient may continue using Ambien . Narcotic medications may need to be reduced to improve hypoxemia. ONO on autotitrator. 4-12 cm water

## 2014-03-02 ENCOUNTER — Other Ambulatory Visit: Payer: Self-pay | Admitting: *Deleted

## 2014-03-02 DIAGNOSIS — G4733 Obstructive sleep apnea (adult) (pediatric): Secondary | ICD-10-CM

## 2014-03-02 DIAGNOSIS — R0902 Hypoxemia: Secondary | ICD-10-CM

## 2014-03-06 ENCOUNTER — Other Ambulatory Visit: Payer: Self-pay | Admitting: Internal Medicine

## 2014-03-09 ENCOUNTER — Other Ambulatory Visit: Payer: Self-pay | Admitting: Internal Medicine

## 2014-03-09 NOTE — Telephone Encounter (Signed)
I received a 2nd re-fill request for this RX

## 2014-03-09 NOTE — Telephone Encounter (Signed)
Sent to the pharmacy by e-scribe. 

## 2014-03-13 ENCOUNTER — Telehealth: Payer: Self-pay | Admitting: Neurology

## 2014-03-13 NOTE — Telephone Encounter (Signed)
Pt wants someone to call him he wants to change his order for CPAP. States [facility] is headed to [facility] to [facility] machine and would like to speak with someone soon.

## 2014-03-14 NOTE — Telephone Encounter (Signed)
Called 3X, first two times patient got started telling me about some issues with [facility] and then his voice faded out and the line went dead.  The third attempt went to voicemail, I left a message with my number and told him I would contact him later today.

## 2014-03-29 ENCOUNTER — Other Ambulatory Visit: Payer: Self-pay | Admitting: Internal Medicine

## 2014-03-31 NOTE — Telephone Encounter (Signed)
Ok to refill x 1 only

## 2014-04-01 ENCOUNTER — Other Ambulatory Visit: Payer: Self-pay | Admitting: Internal Medicine

## 2014-04-03 ENCOUNTER — Ambulatory Visit: Payer: Federal, State, Local not specified - PPO | Admitting: Adult Health

## 2014-04-04 NOTE — Telephone Encounter (Signed)
Ok x 1

## 2014-04-04 NOTE — Telephone Encounter (Signed)
Sent to the pharmacy by e-scribe. 

## 2014-04-24 ENCOUNTER — Encounter: Payer: Self-pay | Admitting: Neurology

## 2014-04-24 ENCOUNTER — Ambulatory Visit (INDEPENDENT_AMBULATORY_CARE_PROVIDER_SITE_OTHER): Payer: Federal, State, Local not specified - PPO | Admitting: Neurology

## 2014-04-24 ENCOUNTER — Ambulatory Visit (AMBULATORY_SURGERY_CENTER): Payer: Self-pay

## 2014-04-24 VITALS — Ht 70.0 in | Wt 200.0 lb

## 2014-04-24 VITALS — HR 75 | Ht 70.5 in | Wt 205.0 lb

## 2014-04-24 DIAGNOSIS — R0902 Hypoxemia: Secondary | ICD-10-CM | POA: Insufficient documentation

## 2014-04-24 DIAGNOSIS — Z9989 Dependence on other enabling machines and devices: Secondary | ICD-10-CM | POA: Insufficient documentation

## 2014-04-24 DIAGNOSIS — Z800 Family history of malignant neoplasm of digestive organs: Secondary | ICD-10-CM

## 2014-04-24 DIAGNOSIS — G4731 Primary central sleep apnea: Secondary | ICD-10-CM

## 2014-04-24 MED ORDER — SUPREP BOWEL PREP KIT 17.5-3.13-1.6 GM/177ML PO SOLN: 1.0000 | Freq: Once | ORAL | Status: DC

## 2014-04-24 NOTE — Addendum Note (Signed)
Addended by: Larey Seat on: 04/24/2014 04:00 PM   Modules accepted: Orders

## 2014-04-24 NOTE — Progress Notes (Signed)
[facility]  Provider:  Larey Seat, [place] D  Referring Provider: Burnis Medin, MD Primary Care Physician:  Hector Dawson, MD  Sleep medicine Follow up.   HPI:  Hector [name] is a 53 y.o. male  Is seen here as a revisit after referral  from Dr. Regis [name] for a sleep apnea evaluation.    Underwent a  polysomnography on 12-15-13.The patient's AHI was 10.9 and then in study the RDI 11.1 on in supine sleep that he have a significant apnea index of 87.6. More concerning was that the patient in spite of the rather mild apnea had low oxygen levels,  but persisted through the sleep study.  I recommended a reduction in the respiratory rate of narcotic medications affording a better respiratory response.  In addition,  I recommended ONO with CPAP still if that can correct the hypoxemia in the setting of complex and central apnea.[name] received the diagnosis of obstructive sleep apnea at a time when he was morbidly obese. He underwent a UPP- palatoplasty, which did not resolve his snoring, He had diabetes type 2, and according to his wife was loudly snoring -independent of position.  In Feb. 2011 he underwent a gastric bypass surgery after which he has reached a normal BMI and initially all symptoms of obstructive sleep apnea had resolved.  Over the last 6 months, he has again developed snoring some nights and he developed again excessive daytime sleepiness and fatigue. He had been using compliantly CPAP with a nasal pillow until his surgery.   It is not clear at this time, if he has torn he still obstructive sleep apnea or if a deficiency syndrome was induced after gastric bypass surgery . Fatigue and excessive daytime sleepiness may be a symptom of this. He  works as a Surveyor, mining. He normally goes to bed around 19.00 hours and will falls asleep within 30 minutes- if taking Ambien.  He rises in the morning at 4:30, and he needs and relies on an alarm  clock. He is not aware how many times at night he may be waking up and he has acknowledged that he has some memory deficits when taking Ambien. He vaguely remembers dreaming, and he has at times trouble to concentrate , daydreaming.  His work begins at 6:30 AM, he is physically active he is expulsed today light. He does not drink any caffeine in any form. He has never been a smoker and. He states that at work he sometimes has to fight the urge to sleep. He endorsed the Epworth Sleepiness Scale at 13 points and the fatigue severity scale at 56 Points. He underwent a clinical neuropsychology test, and he was told not to have dementia.   Mother and father have OSA, CPAP user.   He has a past medical history of status post UPPP, noncardiac angina, diabetes mellitus, renal stones, duodenal ulcers, postconcussion syndrome and gout. He also is under has undergone a gastric bypass surgery which reduced his body mass index significantly. Prior to his weight loss surgery, he had been diagnosed with obstructive sleep apnea. He endorsed the Epworth sleepiness score at 13 of 24 pounds.  The patient's CPAP download  available from 01/23/2014 through 6-9.-15. The patient still has a very high AHI on this download of 11.4. This consists of 11.1 apneas. This pressure was 10 cm water with 3 cm EPR and the patient is highly compliant using his machine 45 hours 59 minutes nightly. He is an  87% compliance.  Based on this I am tempted to put the patient on  AUTO -titration,  between  5 and 12 cm allowing more of day by day variation of the pressure needed. I also would like an overnight pulse oximetry performed  In addition the patient and daughter today the fatigue severity scale of 25 points, the Epworth sleepiness score only at 6/24 points -which is an improvement.  Todays report: Hector [name] is seen here today for repeat patient visit having just been seen in May of this year and in June. This download from his portal Pap CPAP  show a very high compliance of 100% the patient uses his machine for 7 hours and 85 minutes at night his machine is set between 5 and 12 cm water pressure as a pressure Window. His residual AHI has been 12.8. This is higher than the apnea hypopnea index seen in his diagnostic study. The central apnea, for 7.5 obstructive or 0.8 unknown 3.7. The patient's air leak is high. His mass covers his nose but not his mother. He is been followed by advanced on care and the foster. Looking back at his for a-2 or-15 Baseline PSG his AHI was 10.9 RDI 11.1 and at 17 central apneas and 9 obstructive apneas. The at PT 8.5 minutes of oxygen desaturation with the lowest point being 79% the valves the CPAP titration was recommended in light of the rather little AHI. When he was titrated he had only 28.4 minutes of oxygen desaturation for the whole night the lowest at was still 77% he is a regular sinus rhythm he still had a central apneas and 2 obstructive apneas. The AHI was 4.0 the night of the titration. At pressure of 10 cm to have been recommended and looking at his 91st percentile pressure does varies between 8.8 and 9.2 cm water in its very close to recommended pressure. If his AHI is somewhat high in.  I wonder if the air leaks contribute to this. We also did perform an overnight pulse oximetry, which did not show that he had an additional supplemental oxygen.  I will ask [facility] to help to tighten the mask.      Review of Systems: Out of a complete 14 system review, the patient complains of only the following symptoms, and all other reviewed systems are negative. Epworth  now 6 and FSS decreased to 25 on CPAP 10cm water , compliant user.   .   Mild Memory loss, insomnia, treated with Ambien. Racing thoughts at night,  aching muscles some joint pain snoring and fatigue. PTSD?   Small bowel obstruction after Bypass , he has lost 100 pounds.   Tingling numbness in both hands, fingertips. Burning palms.   History    Social History  . Marital Status: Married    Spouse Name: Hector [name]    Number of Children: 2  . Years of Education: Associates   Occupational History  . RADAR TECH    Social History Main Topics  . Smoking status: Never Smoker   . Smokeless tobacco: Never Used  . Alcohol Use: Yes     Comment: occas.  . Drug Use: No  . Sexual Activity: Not on file   Other Topics Concern  . 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   Patient has two children.   Patient has a college education.   Regular exercise-no   Employed Event organiser and  investigation   Now in school travels       10 hours work     Sleep  No longer needs  cpap   Patient is right-handed.   Patient drinks one soda daily.                         Family History  Problem Relation Age of Onset  . Uterine cancer Mother   . Hypertension Mother   . Cervical cancer Mother   . Heart disease Father   . COPD Father   . Colon cancer Maternal Uncle   . Colon cancer Paternal Aunt     Past Medical History  Diagnosis Date  . Hyperlipidemia   . Abnormal LFTs     improved after gastic bypass  . History of concussion   . Gout   . Perforated ulcer     Surgical repair9/24 2011  . B12 nutritional deficiency   . Closed head injury     laceration  9 12  no loc  vision change  . History of renal stone     Seen in emergency room not urologist  . Diabetes mellitus     resolved with gastric bypass  . Chest pain     hopsitalized 2 14 evaluation  . OBESITY 12/10/2007    Qualifier: Diagnosis of  By: Hector Bill MD, Standley Brooking Gastric bypass   . ADJ DISORDER WITH MIXED ANXIETY & DEPRESSED MOOD 08/23/2009    Qualifier: Diagnosis of  By: Hector Bill MD, Standley Brooking Now on lamictal and lexapro and elevil for sleep.   . Sleep apnea     had gastric bypass/ sa-gone  . GERD (gastroesophageal reflux disease)     no meds  . DDD (degenerative disc disease)   . OBSTRUCTIVE SLEEP APNEA  07/22/2007    Qualifier: Diagnosis of  By: [name] RN, Doroteo Bradford    . DIABETES MELLITUS, TYPE II 04/06/2007    Qualifier: Diagnosis of  By: Hector Bill MD, Standley Brooking Improved and off meds since gastric bypass   . OSA (obstructive sleep apnea) 12/12/2013    Past Surgical History  Procedure Laterality Date  . Vasectomy    . Nasal septoplasty w/ turbinoplasty      x 2  . Gastric bypass  11/06/09    beginning weight 267  . Epidural block injection      by Dr. Nelva Bush  . Surgerical repair of stomach ulcer  06/08/10    per  . Hiatal hernia repair N/A 01/20/2013    Procedure: LAPAROSCOPIC REPAIR OF INTERNAL HERNIA;  Surgeon: Shann Medal, MD;  Location: [facility];  Service: General;  Laterality: N/A;  . Back surgery  2012    Dr. Dorina Hoyer disk  . Hernia repair  4696    umbilical   . Tonsillectomy    . Uvulopalatopharyngoplasty  2007    with tonsils  . Shoulder arthroscopy  2014    rt  . Carpal tunnel release  2008    left-ulnar nerve decomp  . Mass excision Right 04/18/2013    Procedure: RIGHT SMALL FINGER SKIN LESION EXCISION;  Surgeon: Jolyn Nap, MD;  Location: [facility];  Service: Orthopedics;  Laterality: Right;    Current Outpatient Prescriptions  Medication Sig Dispense Refill  . amitriptyline (ELAVIL) 50 MG tablet TAKE 1 TABLET AS NEEDED FOR SLEEP  90 tablet  0  . B Complex-C (B-COMPLEX WITH VITAMIN C) tablet Take 1 tablet by mouth daily.      [name]  calcium citrate (CALCITRATE) 950 MG tablet Take 1 tablet by mouth daily.       . Cholecalciferol 10000 UNITS CAPS Take 1 capsule by mouth daily.      [name] escitalopram (LEXAPRO) 20 MG tablet Take 20 mg by mouth daily.      . ferrous sulfate 325 (65 FE) MG tablet Take 325 mg by mouth daily with breakfast.      . indomethacin (INDOCIN) 50 MG capsule Take 50 mg by mouth 3 (three) times daily as needed (pain).      [name] lamoTRIgine (LAMICTAL) 100 MG tablet Take 285m qam and 1074mqhs.      . methocarbamol (ROBAXIN) 750 MG tablet Take 750  mg by mouth 4 (four) times daily as needed. Back pain.      . Multiple Vitamin (MULTIVITAMIN WITH MINERALS) TABS Take 1 tablet by mouth daily.      . ondansetron (ZOFRAN-ODT) 4 MG disintegrating tablet TAKE 1 TABLET (4 MG TOTAL) BY MOUTH EVERY 8 (EIGHT) HOURS AS NEEDED FOR NAUSEA.  20 tablet  0  . oxyCODONE-acetaminophen (PERCOCET/ROXICET) 5-325 MG per tablet As needed      . SUPREP BOWEL PREP SOLN Take 1 kit by mouth once.  354 mL  0  . tamsulosin (FLOMAX) 0.4 MG CAPS Take 0.4 mg by mouth daily after breakfast.      . zolpidem (AMBIEN) 10 MG tablet Take 1 tablet (10 mg total) by mouth at bedtime as needed. For sleep  14 tablet  0   No current facility-administered medications for this visit.    Allergies as of 04/24/2014 - Review Complete 04/24/2014  Allergen Reaction Noted  . Aspirin    . Hydrocodone Other (See Comments) 09/27/2012    Vitals: Pulse 75  Ht 5' 10.5" (1.791 m)  Wt 205 lb (92.987 kg)  BMI 28.99 kg/m2 Last Weight:  Wt Readings from Last 1 Encounters:  04/24/14 205 lb (92.987 kg)   Last Height:   Ht Readings from Last 1 Encounters:  04/24/14 5' 10.5" (1.791 m)    Physical exam:  General: The patient is awake, alert and appears not in acute distress. The patient is well groomed. Head: Normocephalic, atraumatic. Neck is supple. Mallampati- X status post UPPP , neck circumference: 16 , from 20 inches before bypass. No TMJ, status post UPPP  Cardiovascular:  Regular rate and rhythm, without  murmurs or carotid bruit, and without distended neck veins. Respiratory: Lungs are clear to auscultation. Skin:  Without evidence of edema, or rash Trunk: BMI is elevated and patient  has normal posture.  Neurologic exam : The patient is awake and alert, oriented to place and time.  Memory subjective described as intact.  There is a normal attention span & concentration ability. Speech is fluent without dysarthria, dysphonia or aphasia.  Mood and affect are  appropriate.  Cranial nerves: Pupils are equal and briskly reactive to light. Funduscopic exam without  evidence of pallor or edema.  Extraocular movements  in vertical and horizontal planes intact and without nystagmus. Visual fields by finger perimetry are intact. Hearing to finger rub intact.  Facial sensation intact to fine touch. Facial motor strength is symmetric and tongue and uvula move midline.  Motor exam:    Normal tone and normal muscle bulk and symmetric normal strength in all extremities.  Sensory:  Fine touch, pinprick and vibration were tested in all extremities. Proprioception is tested in the upper extremities only. This was  normal.  Coordination: Rapid alternating movements in  the fingers/hands is tested and normal.  Finger-to-nose maneuver tested and normal without evidence of ataxia, dysmetria or tremor.  Gait and station: Patient walks without assistive device and is able and assisted stool climb up to the exam table.  Strength within normal limits. Stance is stable and normal. Tandem gait is unfragmented. Romberg testing is normal.  Deep tendon reflexes: in the  upper and lower extremities are symmetric and intact. Babinski maneuver response is  downgoing.   Assessment:  After physical and neurologic examination, review of laboratory studies, imaging, neurophysiology testing and pre-existing records, assessment is   Now reduced sleepiness and fatigue , but residual AHI is very high 11.4, higher than in his PSG- after a change CPAP to autotitration , his 95% was 9.2 and AHI remained  12.8.   5-12 cm water. ONO on auto titrator did not reveal an additional need for supplement. His Epworth is improved to 12 and  FSS 24, GDS 3 .    He has diffuse pain compliants and sees Dr. Tonita Cong , orthopedist. shoulder , right and left.  He would like a second opinion regarding cervical pain and radiculopathy. He has had ulnar and median nerve impingement in the past.     Plan:    DME to address the mask fit, reduce the leak.  He has benefitted in hypoxemia treatment more than in CSA treatment.  AHi unchanged to baseline while using CPAP compliantly.

## 2014-04-24 NOTE — Progress Notes (Signed)
No allergies to eggs or soy No past problems with anesthesia No home oxygen No diet/weight loss meds  Has email  Emmi instructions given for colonoscopy 

## 2014-04-24 NOTE — Patient Instructions (Signed)
Hypoxemia Hypoxemia occurs when your blood does not contain enough oxygen. The body cannot work well when it does not have enough oxygen because every part of your body needs oxygen. Oxygen travels to all parts of the body through your blood. Hypoxemia can develop suddenly or can come on slowly. CAUSES Some common causes of hypoxemia include:  Long-term (chronic) lung diseases, such as chronic obstructive pulmonary disease (COPD) or interstitial lung disease.  Disorders that affect breathing at night, such as sleep apnea.  Fluid buildup in your lungs (pulmonary edema).  Lung infection (pneumonia).  Lung or throat cancer.  Abnormal blood flow that bypasses the lungs (shunt).  Certain diseasesthat affect nerves or muscles.  A collapsed lung (pneumothorax).  A blood clot in the lungs (pulmonary embolus).  Certain types of heart disease.  Slow or shallow breathing (hypoventilation).  Certain medicines.  High altitudes.  Toxic chemicals and gases. SIGNS AND SYMPTOMS Not everyone who has hypoxemia will develop symptoms. If the hypoxemia developed quickly, you will likely have symptoms such as shortness of breath. If the hypoxemia came on slowly over months or years, you may not notice any symptoms. Symptoms can include:  Shortness of breath (dyspnea).  Bluish color of the skin, lips, or nail beds.  Breathing that is fast, noisy, or shallow.  A fast heartbeat.  Feeling tired or sleepy.  Being confused or feeling anxious. DIAGNOSIS To determine if you have hypoxemia, your health care provider may perform:  A physical exam.  Blood tests.  A pulse oximetry. A sensor will be put on your finger, toe, or earlobe to measure the percent of oxygen in your blood. TREATMENT You will likely be treated with oxygen therapy. Depending on the cause of your hypoxemia, you may need oxygen for a short time (weeks or months), or you may need it indefinitely. Your health care provider  may also recommend other therapies to treat the underlying cause of your hypoxemia. HOME CARE INSTRUCTIONS  Only take over-the-counter or prescription medicines as directed by your health care provider.  Follow oxygen safety measures if you are on oxygen therapy. These may include:  Always having a backup supply of oxygen.  Not allowing anyone to smoke around oxygen.  Handling the oxygen tanks carefully and as instructed.  If you smoke, quit. Stay away from people who smoke.  Follow up with your health care provider as directed. SEEK MEDICAL CARE IF:  You have any concerns about your oxygen therapy.  You still have trouble breathing.  You become short of breath when you exercise.  You are tired when you wake up.  You have a headache when you wake up. SEEK IMMEDIATE MEDICAL CARE IF:   Your breathing gets worse.  You have new shortness of breath with normal activity.  You have a bluish color of the skin, lips, or nail beds.  You have confusion or cloudy thinking.  You cough up dark mucus.  You have chest pain.  You have a fever. MAKE SURE YOU:  Understand these instructions.  Will watch your condition.  Will get help right away if you are not doing well or get worse. Document Released: 03/17/2011 Document Revised: 09/06/2013 Document Reviewed: 03/31/2013 ExitCare Patient Information 2015 [employer]. This information is not intended to replace advice given to you by your health care provider. Make sure you discuss any questions you have with your health care provider.  

## 2014-04-26 ENCOUNTER — Other Ambulatory Visit: Payer: Self-pay | Admitting: Internal Medicine

## 2014-05-08 ENCOUNTER — Ambulatory Visit (AMBULATORY_SURGERY_CENTER): Payer: Federal, State, Local not specified - PPO | Admitting: Gastroenterology

## 2014-05-08 ENCOUNTER — Encounter: Payer: Self-pay | Admitting: Gastroenterology

## 2014-05-08 VITALS — BP 130/82 | HR 65 | Temp 98.0°F | Resp 18 | Ht 70.0 in | Wt 200.0 lb

## 2014-05-08 DIAGNOSIS — Z800 Family history of malignant neoplasm of digestive organs: Secondary | ICD-10-CM

## 2014-05-08 DIAGNOSIS — Z1211 Encounter for screening for malignant neoplasm of colon: Secondary | ICD-10-CM

## 2014-05-08 DIAGNOSIS — K5730 Diverticulosis of large intestine without perforation or abscess without bleeding: Secondary | ICD-10-CM

## 2014-05-08 MED ORDER — SODIUM CHLORIDE 0.9 % IV SOLN: 500.0000 mL | INTRAVENOUS | Status: DC

## 2014-05-08 NOTE — Progress Notes (Signed)
Procedure ends, to recovery, report given and VSS. 

## 2014-05-08 NOTE — Op Note (Signed)
[place]  [address]. [place], [id number]   COLONOSCOPY PROCEDURE REPORT  PATIENT: Hector, [name]  MR#: [id number] BIRTHDATE: [birth year] , 53  yrs. old GENDER: Male ENDOSCOPIST: Inda Castle, MD REFERRED BL:[id number] Darnelle Going, M.D. PROCEDURE DATE:  05/08/2014 PROCEDURE:   Colonoscopy, diagnostic First Screening Colonoscopy - Avg.  risk and is 50 yrs.  old or older Yes.  Prior Negative Screening - Now for repeat screening. N/A  History of Adenoma - Now for follow-up colonoscopy & has been > or = to 3 yrs.  N/A  Polyps Removed Today? No.  Recommend repeat exam, <10 yrs? No. ASA CLASS:   Class III INDICATIONS:Average risk patient for colon cancer. MEDICATIONS: MAC sedation, administered by CRNA and Propofol (Diprivan) 260 mg IV  DESCRIPTION OF PROCEDURE:   After the risks benefits and alternatives of the procedure were thoroughly explained, informed consent was obtained.  A digital rectal exam revealed no abnormalities of the rectum.   The LB [id number]  endoscope was introduced through the anus and advanced to the cecum, which was identified by both the appendix and ileocecal valve. No adverse events experienced.   The quality of the prep was Suprep good  The instrument was then slowly withdrawn as the colon was fully examined.      COLON FINDINGS: Mild diverticulosis was noted in the descending colon.   The colon was otherwise normal.  There was no diverticulosis, inflammation, polyps or cancers unless previously stated.  Retroflexed views revealed no abnormalities. The time to cecum=2 minutes 56 seconds.  Withdrawal time=7 minutes 07 seconds. The scope was withdrawn and the procedure completed. COMPLICATIONS: There were no complications.  ENDOSCOPIC IMPRESSION: 1.   Mild diverticulosis was noted in the descending colon 2.   The colon was otherwise normal  RECOMMENDATIONS: Continue current colorectal screening recommendations for  "routine risk" patients with a repeat colonoscopy in 10 years.   eSigned:  Inda Castle, MD 05/08/2014 8:52 AM   cc:   PATIENT NAME:  Hector, [name] MR#: [id number]

## 2014-05-08 NOTE — Patient Instructions (Addendum)
YOU HAD AN ENDOSCOPIC PROCEDURE TODAY AT [facility]: Refer to the procedure report that was given to you for any specific questions about what was found during the examination.  If the procedure report does not answer your questions, please call your gastroenterologist to clarify.  If you requested that your care partner not be given the details of your procedure findings, then the procedure report has been included in a sealed envelope for you to review at your convenience later.  YOU SHOULD EXPECT: Some feelings of bloating in the abdomen. Passage of more gas than usual.  Walking can help get rid of the air that was put into your GI tract during the procedure and reduce the bloating. If you had a lower endoscopy (such as a colonoscopy or flexible sigmoidoscopy) you may notice spotting of blood in your stool or on the toilet paper. If you underwent a bowel prep for your procedure, then you may not have a normal bowel movement for a few days.  DIET: Your first meal following the procedure should be a light meal and then it is ok to progress to your normal diet.  A half-sandwich or bowl of soup is an example of a good first meal.  Heavy or fried foods are harder to digest and may make you feel nauseous or bloated.  Likewise meals heavy in dairy and vegetables can cause extra gas to form and this can also increase the bloating.  Drink plenty of fluids but you should avoid alcoholic beverages for 24 hours.  ACTIVITY: Your care partner should take you home directly after the procedure.  You should plan to take it easy, moving slowly for the rest of the day.  You can resume normal activity the day after the procedure however you should NOT DRIVE or use heavy machinery for 24 hours (because of the sedation medicines used during the test).    SYMPTOMS TO REPORT IMMEDIATELY: A gastroenterologist can be reached at any hour.  During normal business hours, 8:30 AM to 5:00 PM Monday through Friday,  call [phone].  After hours and on weekends, please call the GI answering service at [phone] who will take a message and have the physician on call contact you.   Following lower endoscopy (colonoscopy or flexible sigmoidoscopy):  Excessive amounts of blood in the stool  Significant tenderness or worsening of abdominal pains  Swelling of the abdomen that is new, acute  Fever of 100F or higher  FOLLOW UP: Our staff will call the home number listed on your records the next business day following your procedure to check on you and address any questions or concerns that you may have at that time regarding the information given to you following your procedure. This is a courtesy call and so if there is no answer at the home number and we have not heard from you through the emergency physician on call, we will assume that you have returned to your regular daily activities without incident.  SIGNATURES/CONFIDENTIALITY: You and/or your care partner have signed paperwork which will be entered into your electronic medical record.  These signatures attest to the fact that that the information above on your After Visit Summary has been reviewed and is understood.  Full responsibility of the confidentiality of this discharge information lies with you and/or your care-partner.  Continue your normal medications  Please read over handouts about diverticulosis and high fiber diets  Next colonoscopy in 10 years

## 2014-05-09 ENCOUNTER — Telehealth: Payer: Self-pay

## 2014-05-09 NOTE — Telephone Encounter (Signed)
No answer, left vm 

## 2014-05-12 ENCOUNTER — Telehealth: Payer: Self-pay | Admitting: Internal Medicine

## 2014-05-12 NOTE — Telephone Encounter (Signed)
Patient scheduled for 05/15/14 @ 2 pm.

## 2014-05-12 NOTE — Telephone Encounter (Signed)
Please have pt make appt next week to do surgery form and any labs due.

## 2014-05-15 ENCOUNTER — Encounter: Payer: Self-pay | Admitting: Internal Medicine

## 2014-05-15 ENCOUNTER — Ambulatory Visit (INDEPENDENT_AMBULATORY_CARE_PROVIDER_SITE_OTHER): Payer: Federal, State, Local not specified - PPO | Admitting: Internal Medicine

## 2014-05-15 VITALS — BP 120/74 | Temp 99.0°F | Ht 70.0 in | Wt 203.0 lb

## 2014-05-15 DIAGNOSIS — Z01818 Encounter for other preprocedural examination: Secondary | ICD-10-CM

## 2014-05-15 DIAGNOSIS — Z79899 Other long term (current) drug therapy: Secondary | ICD-10-CM | POA: Insufficient documentation

## 2014-05-15 DIAGNOSIS — M5416 Radiculopathy, lumbar region: Secondary | ICD-10-CM | POA: Insufficient documentation

## 2014-05-15 DIAGNOSIS — Z23 Encounter for immunization: Secondary | ICD-10-CM

## 2014-05-15 DIAGNOSIS — IMO0002 Reserved for concepts with insufficient information to code with codable children: Secondary | ICD-10-CM

## 2014-05-15 DIAGNOSIS — E119 Type 2 diabetes mellitus without complications: Secondary | ICD-10-CM

## 2014-05-15 LAB — CBC WITH DIFFERENTIAL/PLATELET
BASOS ABS: 0 10*3/uL (ref 0.0–0.1)
Basophils Relative: 0.3 % (ref 0.0–3.0)
Eosinophils Absolute: 0.2 10*3/uL (ref 0.0–0.7)
Eosinophils Relative: 3.9 % (ref 0.0–5.0)
HCT: 45.3 % (ref 39.0–52.0)
Hemoglobin: 14.8 g/dL (ref 13.0–17.0)
Lymphocytes Relative: 38.6 % (ref 12.0–46.0)
Lymphs Abs: 1.9 10*3/uL (ref 0.7–4.0)
MCHC: 32.7 g/dL (ref 30.0–36.0)
MCV: 97.7 fl (ref 78.0–100.0)
MONOS PCT: 6.1 % (ref 3.0–12.0)
Monocytes Absolute: 0.3 10*3/uL (ref 0.1–1.0)
Neutro Abs: 2.5 10*3/uL (ref 1.4–7.7)
Neutrophils Relative %: 51.1 % (ref 43.0–77.0)
PLATELETS: 160 10*3/uL (ref 150.0–400.0)
RBC: 4.64 Mil/uL (ref 4.22–5.81)
RDW: 13.1 % (ref 11.5–15.5)
WBC: 4.8 10*3/uL (ref 4.0–10.5)

## 2014-05-15 LAB — BASIC METABOLIC PANEL
BUN: 16 mg/dL (ref 6–23)
CALCIUM: 9.2 mg/dL (ref 8.4–10.5)
CO2: 28 mEq/L (ref 19–32)
Chloride: 103 mEq/L (ref 96–112)
Creatinine, Ser: 1.4 mg/dL (ref 0.4–1.5)
GFR: 55.96 mL/min — AB (ref >60.00)
Glucose, Bld: 137 mg/dL — ABNORMAL HIGH (ref 70–99)
Potassium: 4.5 mEq/L (ref 3.5–5.1)
Sodium: 139 mEq/L (ref 135–145)

## 2014-05-15 LAB — LIPID PANEL
Cholesterol: 182 mg/dL (ref 0–200)
HDL: 41.9 mg/dL (ref >39.00)
LDL Cholesterol: 108 mg/dL — ABNORMAL HIGH (ref 0–99)
NonHDL: 140.1
Total CHOL/HDL Ratio: 4
Triglycerides: 162 mg/dL — ABNORMAL HIGH (ref 0.0–149.0)
VLDL: 32.4 mg/dL (ref 0.0–40.0)

## 2014-05-15 LAB — HEPATIC FUNCTION PANEL
ALBUMIN: 4.2 g/dL (ref 3.5–5.2)
ALT: 14 U/L (ref 0–53)
AST: 18 U/L (ref 0–37)
Alkaline Phosphatase: 78 U/L (ref 39–117)
Bilirubin, Direct: 0 mg/dL (ref 0.0–0.3)
TOTAL PROTEIN: 7.2 g/dL (ref 6.0–8.3)
Total Bilirubin: 0.4 mg/dL (ref 0.2–1.2)

## 2014-05-15 LAB — HEMOGLOBIN A1C: HEMOGLOBIN A1C: 6.4 % (ref 4.6–6.5)

## 2014-05-15 LAB — MICROALBUMIN / CREATININE URINE RATIO
Creatinine,U: 207.3 mg/dL
MICROALB/CREAT RATIO: 0.3 mg/g (ref 0.0–30.0)
Microalb, Ur: 0.6 mg/dL (ref 0.0–1.9)

## 2014-05-15 NOTE — Patient Instructions (Signed)
No contraindication to surgery . Will send over form . Indocin is  at risk of  Gi bleeding  So limit use and also can ask dr Maureen Chatters if need other options for cluster headaches.  Will notify you  of labs when available.

## 2014-05-15 NOTE — Progress Notes (Signed)
Pre visit review using our clinic review tool, if applicable. No additional management support is needed unless otherwise documented below in the visit note.  Chief Complaint  Patient presents with  . Medical Clearance    "dm"    HPI: Hector [name] Comes in for  Pre op evaluation for L4-L5 back surgery with radiculopathy pain and also due for lab monitoring soon. Surgery to be done by Dr. Maxie Better as soon as possible.  He has had some ongoing problems with his left foot that he thought could have been other causes such as gout. Seems to be lesion related with this cyst pushing on his nerve. He is currently taking Percocet about 2 times a day but still has continued pain.  Also under recent eval dr Dohmier  For sleep apnea  complianct but stil l doesn't meet criteria but is on CPAP   Has recently asked refill request for Indocin. He was given this a long time ago when he was diagnosed with cluster headaches and appears to work well. 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 He doesn't take this medicine on a regular basis.  In regard to his diabetes he still thinks it's in good control has only gained a few pounds since the problem with his back. He is due soon for laboratory tests.  ROS: See pertinent positives and negatives per HPI. Regard to sleep he sometimes alternates amitriptyline and Ambien to not take Ambien on a regular basis. No recent chest pain shortness of breath neurologic events. Noacute bleeding.  Past Medical History  Diagnosis Date  . Hyperlipidemia   . Abnormal LFTs     improved after gastic bypass  . History of concussion   . Gout   . Perforated ulcer     Surgical repair9/24 2011  . B12 nutritional deficiency   . Closed head injury     laceration  9 12  no loc  vision change  . History of renal stone     Seen in emergency room not urologist  . Diabetes mellitus     resolved with  gastric bypass  . Chest pain     hopsitalized 2 14 evaluation  . OBESITY 12/10/2007    Qualifier: Diagnosis of  By: [name] MD, Standley Brooking Gastric bypass   . 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   . Sleep apnea     had gastric bypass/ sa-gone  . GERD (gastroesophageal reflux disease)     no meds  . DDD (degenerative disc disease)   . OBSTRUCTIVE SLEEP APNEA 07/22/2007    Qualifier: Diagnosis of  By: [name] RN, Doroteo Bradford    . DIABETES MELLITUS, TYPE II 04/06/2007    Qualifier: Diagnosis of  By: [name] MD, Standley Brooking Improved and off meds since gastric bypass   . OSA (obstructive sleep apnea) 12/12/2013  . 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 Uterine cancer Mother   . Hypertension Mother   . Cervical cancer Mother   . Heart disease Father   . COPD Father   . Colon cancer Maternal Uncle   . Colon cancer Paternal Aunt     History   Social History  . Marital Status: Married  Spouse Name: Janine    Number of Children: 2  . Years of Education: Associates   Occupational History  . RADAR TECH    Social History Main Topics  . Smoking status: Never Smoker   . Smokeless tobacco: Never Used  . Alcohol Use: Yes     Comment: occas.  . Drug Use: No  . Sexual Activity: None   Other Topics Concern  . 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   Patient has two children.   Patient has a college education.   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   Patient drinks one soda daily.          Outpatient Encounter Prescriptions as of 05/15/2014  Medication Sig  . amitriptyline (ELAVIL) 50 MG tablet TAKE 1 TABLET AS NEEDED FOR SLEEP    . B Complex-C (B-COMPLEX WITH VITAMIN C) tablet Take 1 tablet by mouth daily.  . calcium citrate (CALCITRATE) 950 MG tablet Take 1 tablet by mouth daily.   . Cholecalciferol 10000 UNITS CAPS Take 1 capsule by mouth daily.  [name] escitalopram (LEXAPRO) 20 MG tablet Take 20 mg by mouth daily.  . ferrous sulfate 325 (65 FE) MG tablet Take 325 mg by mouth daily with breakfast.  . indomethacin (INDOCIN) 50 MG capsule Take 50 mg by mouth 3 (three) times daily as needed (pain).  . indomethacin (INDOCIN) 50 MG capsule TAKE 1 CAPSULE BY MOUTH 3 TIMES A DAY AS NEEDED  . lamoTRIgine (LAMICTAL) 100 MG tablet Take 200mg  qam and 100mg  qhs.  . methocarbamol (ROBAXIN) 750 MG tablet Take 750 mg by mouth 4 (four) times daily as needed. Back pain.  . Multiple Vitamin (MULTIVITAMIN WITH MINERALS) TABS Take 1 tablet by mouth daily.  . ondansetron (ZOFRAN-ODT) 4 MG disintegrating tablet TAKE 1 TABLET (4 MG TOTAL) BY MOUTH EVERY 8 (EIGHT) HOURS AS NEEDED FOR NAUSEA.  [name] oxyCODONE-acetaminophen (PERCOCET/ROXICET) 5-325 MG per tablet As needed  . tamsulosin (FLOMAX) 0.4 MG CAPS Take 0.4 mg by mouth daily after breakfast.  . zolpidem (AMBIEN) 10 MG tablet Take 1 tablet (10 mg total) by mouth at bedtime as needed. For sleep    EXAM:  BP 120/74  Temp(Src) 99 F (37.2 C) (Oral)  Ht 5\' 10"  (1.778 m)  Wt 203 lb (92.08 kg)  BMI 29.13 kg/m2  Body mass index is 29.13 kg/(m^2).  GENERAL: vitals reviewed and listed above, alert, oriented, appears well hydrated and in no acute distress a bit uncomfortable cooperative nontoxic HEENT: atraumatic, conjunctiva  clear, no obvious abnormalities on inspection of external nose and ears NECK: no obvious masses on inspection palpation  LUNGS: clear to auscultation bilaterally, no wheezes, rales or rhonchi, good air movement CV: HRRR, no clubbing cyanosis or  peripheral edema nl cap refill  MS: moves all extremities  mildly antalgic gait Skin no acute bruising or bleeding. PSYCH:  pleasant and cooperative, no obvious depression or anxiety Wt Readings from Last 3 Encounters:  05/15/14 203 lb (92.08 kg)  05/08/14 200 lb (90.719 kg)  04/24/14 205 lb (92.987 kg)     ASSESSMENT AND PLAN:  Discussed the following assessment and plan:  Lumbar back pain with radiculopathy affecting left lower extremity - Reported at L4-L5 level - Plan:  Lipid panel, Basic metabolic panel, CBC with Differential, Hemoglobin A1c, Microalbumin / creatinine urine ratio, Hepatic function panel, Flu Vaccine QUAD 36+ mos PF IM (Fluarix Quad PF)  Type II or unspecified type diabetes mellitus without mention of complication, not stated as uncontrolled - Recheck labs today has been controlled since his gastric bypass. - Plan: Lipid panel, Basic metabolic panel, CBC with Differential, Hemoglobin A1c, Microalbumin / creatinine urine ratio, Hepatic function panel, Flu Vaccine QUAD 36+ mos PF IM (Fluarix Quad PF)  Need for prophylactic vaccination and inoculation against influenza  Medication management - Caution with indomethacin. Lab pending - Plan: Lipid panel, Basic metabolic panel, CBC with Differential, Hemoglobin A1c, Microalbumin / creatinine urine ratio, Hepatic function panel, Flu Vaccine QUAD 36+ mos PF IM (Fluarix Quad PF)  Pre-op evaluation - Acceptable candidate for surgery form signed to be faxed. - Plan: Lipid panel, Basic metabolic panel, CBC with Differential, Hemoglobin A1c, Microalbumin / creatinine urine ratio, Hepatic function panel, Flu Vaccine QUAD 36+ mos PF IM (Fluarix Quad PF) Ask d r D about the indocin  For reasons discussed with high GI risk. Okay to continue at this point -Patient advised to return or notify health care team  if symptoms worsen ,persist or new concerns arise.  Patient Instructions  No contraindication to surgery . Will send over form . Indocin is  at risk of  Gi bleeding  So limit use and also can ask dr Maureen Chatters if need other options for cluster headaches.    Will notify you  of labs when available.      Standley Brooking. Panosh M.D. Lab Results  Component Value Date   WBC 4.8 05/15/2014   HGB 14.8 05/15/2014   HCT 45.3 05/15/2014   PLT 160.0 05/15/2014   GLUCOSE 137* 05/15/2014   CHOL 182 05/15/2014   TRIG 162.0* 05/15/2014   HDL 41.90 05/15/2014   LDLDIRECT 113.2 04/01/2007   LDLCALC 108* 05/15/2014   ALT 14 05/15/2014   AST 18 05/15/2014   NA 139 05/15/2014   K 4.5 05/15/2014   CL 103 05/15/2014   CREATININE 1.4 05/15/2014   BUN 16 05/15/2014   CO2 28 05/15/2014   TSH 1.10 07/11/2013   PSA 1.01 08/30/2012   INR 0.97 10/19/2012   HGBA1C 6.4 05/15/2014   MICROALBUR 0.6 05/15/2014

## 2014-05-18 ENCOUNTER — Other Ambulatory Visit: Payer: Self-pay | Admitting: Orthopedic Surgery

## 2014-05-19 ENCOUNTER — Other Ambulatory Visit: Payer: Self-pay | Admitting: Internal Medicine

## 2014-05-19 DIAGNOSIS — E119 Type 2 diabetes mellitus without complications: Secondary | ICD-10-CM

## 2014-05-24 ENCOUNTER — Encounter: Payer: Self-pay | Admitting: Family Medicine

## 2014-05-25 ENCOUNTER — Ambulatory Visit: Payer: Self-pay | Admitting: Orthopedic Surgery

## 2014-05-25 NOTE — H&P (Signed)
Hector [name] is an 53 y.o. male.   Chief Complaint: back and L leg pain HPI: The patient is a 53 year old male who presents today for follow up of their back. The patient is being followed for their back pain. They are now 6 month(s) out from most recent flare up. Symptoms reported today include: pain (low back), weakness, numbness (left leg) and leg pain (left). Current treatment includes: home exercise program. The following medication has been used for pain control: Percocet (7.5/325mg ). The patient reports their current pain level to be moderate to severe. The patient presents today following SNRB (left L5). The patient has not gotten any relief of their symptoms with Cortisone injections.  He reports continued pain. No relief from his injection. It actually made it worse. He is having more pain that radiates down the leg than in the lower back.  Past Medical History  Diagnosis Date  . Hyperlipidemia   . Abnormal LFTs     improved after gastic bypass  . History of concussion   . Gout   . Perforated ulcer     Surgical repair9/24 2011  . B12 nutritional deficiency   . Closed head injury     laceration  9 12  no loc  vision change  . History of renal stone     Seen in emergency room not urologist  . Diabetes mellitus     resolved with gastric bypass  . Chest pain     hopsitalized 2 14 evaluation  . OBESITY 12/10/2007    Qualifier: Diagnosis of  By: [name] MD, Standley Brooking Gastric bypass   . 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   . Sleep apnea     had gastric bypass/ sa-gone  . GERD (gastroesophageal reflux disease)     no meds  . DDD (degenerative disc disease)   . OBSTRUCTIVE SLEEP APNEA 07/22/2007    Qualifier: Diagnosis of  By: [name] RN, Doroteo Bradford    . DIABETES MELLITUS, TYPE II 04/06/2007    Qualifier: Diagnosis of  By: [name] MD, Standley Brooking Improved and off meds  since gastric bypass   . OSA (obstructive sleep apnea) 12/12/2013  . 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 Vasectomy    . Nasal septoplasty w/ turbinoplasty      x 2  . Gastric bypass  11/06/09    beginning weight 267  . Epidural block injection      by Dr. Nelva Bush  . Surgerical repair of stomach ulcer  06/08/10    per  . Hiatal hernia repair N/A 01/20/2013    Procedure: LAPAROSCOPIC REPAIR OF INTERNAL HERNIA;  Surgeon: Shann Medal, MD;  Location: [facility];  Service: General;  Laterality: N/A;  . Back surgery  2012    Dr. Dorina Hoyer disk  . Hernia repair  5956    umbilical   . Tonsillectomy    . Uvulopalatopharyngoplasty  2007    with tonsils  . Shoulder arthroscopy  2014    rt  . Carpal tunnel release  2008    left-ulnar nerve decomp  . Mass excision Right 04/18/2013    Procedure: RIGHT SMALL FINGER SKIN LESION EXCISION;  Surgeon: Jolyn Nap, MD;  Location: [facility];  Service: Orthopedics;  Laterality: Right;    Family  History  Problem Relation Age of Onset  . Uterine cancer Mother   . Hypertension Mother   . Cervical cancer Mother   . Heart disease Father   . COPD Father   . Colon cancer Maternal Uncle   . Colon cancer Paternal Aunt    Social History:  reports that he has never smoked. He has never used smokeless tobacco. He reports that he drinks alcohol. He reports that he does not use illicit drugs.  Allergies:  Allergies  Allergen Reactions  . Aspirin     REACTION: Pt had a allergic reaction as a perforated ulcer  . Hydrocodone Other (See Comments)    headache     (Not in a hospital admission)  No results found for this or any previous visit (from the past 48 hour(s)). No results found.  Review of Systems  Constitutional: Negative.   HENT: Negative.   Eyes: Negative.   Respiratory: Negative.   Cardiovascular: Negative.   Gastrointestinal: Negative.   Genitourinary:  Negative.   Musculoskeletal: Positive for back pain.  Skin: Negative.   Neurological: Positive for sensory change and focal weakness.  Psychiatric/Behavioral: Negative.     There were no vitals taken for this visit. Physical Exam  Constitutional: He is oriented to person, place, and time. He appears well-developed and well-nourished.  HENT:  Head: Normocephalic and atraumatic.  Eyes: Conjunctivae and EOM are normal. Pupils are equal, round, and reactive to light.  Neck: Normal range of motion. Neck supple.  Cardiovascular: Normal rate and regular rhythm.   Respiratory: Effort normal and breath sounds normal.  GI: Soft. Bowel sounds are normal.  Musculoskeletal:  On exam, straight leg raise produces buttock, thigh, and calf pain on the left and negative on the right. He has 4+/5 EHL. He has pain with extension and flexion of the lumbar spine. Lumbar spine exam reveals no evidence of soft tissue swelling, ecchymosis or deformity. On palpation there is no flank pain with percussion. Nontender over the trochanters.  Neurological: He is alert and oriented to person, place, and time. He has normal reflexes.  Skin: Skin is warm.  Psychiatric: He has a normal mood and affect.    MRI demonstrates severe facet arthrosis at 5-1. Neural foraminal stenosis secondary to synovial cyst. Hypertrophy.  Assessment/Plan Spinal stenosis L5-S1 left, ganglion cyst 1. Persistent L5 radiculopathy secondary to neural foraminal stenosis, facet cyst present in the 5 root . 2. Underlying facet arthropathy.  1. We have discussed options of living with the symptoms verses lumbar fusion verses foraminotomy, excision of the cyst, and we might be able to do an intraoperative rhizotomy over the facet joint if I denervate the facet joint as well. He is not interested in the fusion. He cannot live with his symptoms. He is requiring Percocet frequently. 2. We did discuss the foraminotomy, excision of the cyst  but we discussed possibility of residual symptoms requiring fusion. He understands this. This is addressed with his leg pain, not back pain. We spent considerable time discussing all of these issues. We will have him undergo preop clearance. I had an extensive discussion of the risks and benefits of the lumbar decompression with the patient including bleeding, infection, damage to neurovascular structures, epidural fibrosis, CSF leak requiring repair. We also discussed increase in pain, adjacent segment disease, recurrent disc herniation, need for future surgery including repeat decompression and/or fusion. We also discussed risks of postoperative hematoma, paralysis, anesthetic complications including DVT, PE, death, or cardiopulmonary dysfunction. In addition, the perioperative  and postoperative courses were discussed in detail including the rehabilitative time and return to functional activity and work. I provided the patient with an illustrated handout and utilized the appropriate surgical models. I spent over 25 minutes discussing all of these issues, reviewing his options in detail.  Sundance Moise M. PA-C for Dr. Tonita Cong 05/25/2014, 7:33 AM

## 2014-05-31 ENCOUNTER — Encounter (HOSPITAL_COMMUNITY): Payer: Self-pay | Admitting: Pharmacy Technician

## 2014-06-05 ENCOUNTER — Ambulatory Visit (HOSPITAL_COMMUNITY)
Admission: RE | Admit: 2014-06-05 | Discharge: 2014-06-05 | Disposition: A | Discharge status: Home / Self Care | Payer: Federal, State, Local not specified - PPO | Source: Ambulatory Visit | Attending: Orthopedic Surgery | Admitting: Orthopedic Surgery

## 2014-06-05 ENCOUNTER — Encounter (HOSPITAL_COMMUNITY): Payer: Self-pay

## 2014-06-05 ENCOUNTER — Encounter (INDEPENDENT_AMBULATORY_CARE_PROVIDER_SITE_OTHER): Payer: Self-pay

## 2014-06-05 ENCOUNTER — Ambulatory Visit (HOSPITAL_COMMUNITY)
Admission: RE | Admit: 2014-06-05 | Discharge: 2014-06-05 | Disposition: A | Discharge status: Home / Self Care | Payer: Federal, State, Local not specified - PPO | Source: Ambulatory Visit | Attending: Anesthesiology | Admitting: Anesthesiology

## 2014-06-05 ENCOUNTER — Encounter (HOSPITAL_COMMUNITY)
Admission: RE | Admit: 2014-06-05 | Discharge: 2014-06-05 | Disposition: A | Discharge status: Home / Self Care | Payer: Federal, State, Local not specified - PPO | Source: Ambulatory Visit | Attending: Specialist | Admitting: Specialist

## 2014-06-05 DIAGNOSIS — M48061 Spinal stenosis, lumbar region without neurogenic claudication: Secondary | ICD-10-CM | POA: Diagnosis present

## 2014-06-05 DIAGNOSIS — Z01810 Encounter for preprocedural cardiovascular examination: Secondary | ICD-10-CM | POA: Diagnosis present

## 2014-06-05 DIAGNOSIS — M6740 Ganglion, unspecified site: Secondary | ICD-10-CM | POA: Insufficient documentation

## 2014-06-05 DIAGNOSIS — E119 Type 2 diabetes mellitus without complications: Secondary | ICD-10-CM | POA: Insufficient documentation

## 2014-06-05 DIAGNOSIS — Z01812 Encounter for preprocedural laboratory examination: Secondary | ICD-10-CM | POA: Diagnosis present

## 2014-06-05 LAB — BASIC METABOLIC PANEL
Anion gap: 10 (ref 5–15)
BUN: 12 mg/dL (ref 6–23)
CHLORIDE: 105 meq/L (ref 96–112)
CO2: 27 mEq/L (ref 19–32)
CREATININE: 1.15 mg/dL (ref 0.50–1.35)
Calcium: 9.3 mg/dL (ref 8.4–10.5)
GFR calc non Af Amer: 72 mL/min — ABNORMAL LOW (ref >90)
GFR, EST AFRICAN AMERICAN: 83 mL/min — AB (ref >90)
Glucose, Bld: 128 mg/dL — ABNORMAL HIGH (ref 70–99)
Potassium: 4.7 mEq/L (ref 3.7–5.3)
Sodium: 142 mEq/L (ref 137–147)

## 2014-06-05 LAB — CBC
HEMATOCRIT: 42.4 % (ref 39.0–52.0)
HEMOGLOBIN: 14.2 g/dL (ref 13.0–17.0)
MCH: 31.5 pg (ref 26.0–34.0)
MCHC: 33.5 g/dL (ref 30.0–36.0)
MCV: 94 fL (ref 78.0–100.0)
Platelets: 160 10*3/uL (ref 150–400)
RBC: 4.51 MIL/uL (ref 4.22–5.81)
RDW: 13 % (ref 11.5–15.5)
WBC: 4.5 10*3/uL (ref 4.0–10.5)

## 2014-06-05 LAB — SURGICAL PCR SCREEN
MRSA, PCR: NEGATIVE
Staphylococcus aureus: POSITIVE — AB

## 2014-06-05 NOTE — Pre-Procedure Instructions (Addendum)
06-05-14 EKG/ CXR, Lumbar Spine xray done today. 06-05-14 Echo 2'14 Epic.

## 2014-06-05 NOTE — Patient Instructions (Signed)
[place]  06/05/2014   Your procedure is scheduled on:   06-07-2014  Enter through [facility] Entrance and follow signs to [place]. Arrive at   [place]     AM.  Call this number if you have problems the morning of surgery: [phone]  Or Presurgical Testing [phone].   For Living Will and/or Health Care Power Attorney Forms: please provide copy for your medical record,may bring AM of surgery(Forms should be already notarized -we do not provide this service).(06-05-14 Yes/ will provide  information on day of 06-07-14).   For Cpap use: Bring mask and tubing only.   Do not eat food/ or drink: After Midnight.      Take these medicines the morning of surgery with A SIP OF WATER: Lexapro. Lamictal. Tamsulosin.    Do not wear jewelry, make-up or nail polish.  Do not wear lotions, powders, or perfumes. You may not  wear deodorant.  Do not shave 48 hours(2 days) prior to first CHG shower(legs and under arms).(Shaving face and neck okay.)  Do not bring valuables to the hospital.(Hospital is not responsible for lost valuables).  Contacts, dentures or removable bridgework, body piercing, hair pins may not be worn into surgery.  Leave suitcase in the car. After surgery it may be brought to your room.  For patients admitted to the hospital, checkout time is 11:00 AM the day of discharge.(Restricted visitors-Any Persons displaying flu-like symptoms or illness).    Patients discharged the day of surgery will not be allowed to drive home. Must have responsible person with you x 24 hours once discharged.  Name and phone number of your driver: [name], spouse [phone] cell  Special Instructions: CHG(Chlorhedine 4%-"Hibiclens","Betasept","Aplicare") Shower Use Special Wash: see special instructions.(avoid face and genitals)   Please read over the following fact sheets that you were given: MRSA Information, Blood Transfusion fact sheet, Incentive Spirometry  Instruction.  Remember : Type/Screen "Blue armbands" - may not be removed once applied(would result in being retested AM of surgery, if removed).  Failure to follow these instructions may result in Cancellation of your surgery.   Patient signature_______________________________________________________    [facility] - Preparing for Surgery Before surgery, you can play an important role.  Because skin is not sterile, your skin needs to be as free of germs as possible.  You can reduce the number of germs on your skin by washing with CHG (chlorahexidine gluconate) soap before surgery.  CHG is an antiseptic cleaner which kills germs and bonds with the skin to continue killing germs even after washing. Please DO NOT use if you have an allergy to CHG or antibacterial soaps.  If your skin becomes reddened/irritated stop using the CHG and inform your nurse when you arrive at Short Stay. Do not shave (including legs and underarms) for at least 48 hours prior to the first CHG shower.  You may shave your face/neck. Please follow these instructions carefully:  1.  Shower with CHG Soap the night before surgery and the  morning of Surgery.  2.  If you choose to wash your hair, wash your hair first as usual with your  normal  shampoo.  3.  After you shampoo, rinse your hair and body thoroughly to remove the  shampoo.                           4.  Use CHG as you would any other liquid soap.  You can apply chg directly  to the skin and wash                       Gently with a scrungie or clean washcloth.  5.  Apply the CHG Soap to your body ONLY FROM THE NECK DOWN.   Do not use on face/ open                           Wound or open sores. Avoid contact with eyes, ears mouth and genitals (private parts).                       Wash face,  Genitals (private parts) with your normal soap.             6.  Wash thoroughly, paying special attention to the area where your surgery  will be performed.  7.  Thoroughly rinse  your body with warm water from the neck down.  8.  DO NOT shower/wash with your normal soap after using and rinsing off  the CHG Soap.                9.  Pat yourself dry with a clean towel.            10.  Wear clean pajamas.            11.  Place clean sheets on your bed the night of your first shower and do not  sleep with pets. Day of Surgery : Do not apply any lotions/deodorants the morning of surgery.  Please wear clean clothes to the hospital/surgery center.  FAILURE TO FOLLOW THESE INSTRUCTIONS MAY RESULT IN THE CANCELLATION OF YOUR SURGERY PATIENT SIGNATURE_________________________________  NURSE SIGNATURE__________________________________  ________________________________________________________________________   Hector [name]  An incentive spirometer is a tool that can help keep your lungs clear and active. This tool measures how well you are filling your lungs with each breath. Taking long deep breaths may help reverse or decrease the chance of developing breathing (pulmonary) problems (especially infection) following:  A long period of time when you are unable to move or be active. BEFORE THE PROCEDURE   If the spirometer includes an indicator to show your best effort, your nurse or respiratory therapist will set it to a desired goal.  If possible, sit up straight or lean slightly forward. Try not to slouch.  Hold the incentive spirometer in an upright position. INSTRUCTIONS FOR USE  1. Sit on the edge of your bed if possible, or sit up as far as you can in bed or on a chair. 2. Hold the incentive spirometer in an upright position. 3. Breathe out normally. 4. Place the mouthpiece in your mouth and seal your lips tightly around it. 5. Breathe in slowly and as deeply as possible, raising the piston or the ball toward the top of the column. 6. Hold your breath for 3-5 seconds or for as long as possible. Allow the piston or ball to fall to the bottom of the  column. 7. Remove the mouthpiece from your mouth and breathe out normally. 8. Rest for a few seconds and repeat Steps 1 through 7 at least 10 times every 1-2 hours when you are awake. Take your time and take a few normal breaths between deep breaths. 9. The spirometer may include an indicator to show your best effort. Use the indicator as a goal to work  toward during each repetition. 10. After each set of 10 deep breaths, practice coughing to be sure your lungs are clear. If you have an incision (the cut made at the time of surgery), support your incision when coughing by placing a pillow or rolled up towels firmly against it. Once you are able to get out of bed, walk around indoors and cough well. You may stop using the incentive spirometer when instructed by your caregiver.  RISKS AND COMPLICATIONS  Take your time so you do not get dizzy or light-headed.  If you are in pain, you may need to take or ask for pain medication before doing incentive spirometry. It is harder to take a deep breath if you are having pain. AFTER USE  Rest and breathe slowly and easily.  It can be helpful to keep track of a log of your progress. Your caregiver can provide you with a simple table to help with this. If you are using the spirometer at home, follow these instructions: [place] IF:   You are having difficultly using the spirometer.  You have trouble using the spirometer as often as instructed.  Your pain medication is not giving enough relief while using the spirometer.  You develop fever of 100.5 F (38.1 C) or higher. SEEK IMMEDIATE MEDICAL CARE IF:   You cough up bloody sputum that had not been present before.  You develop fever of 102 F (38.9 C) or greater.  You develop worsening pain at or near the incision site. MAKE SURE YOU:   Understand these instructions.  Will watch your condition.  Will get help right away if you are not doing well or get worse. Document Released:  01/12/2007 Document Revised: 11/24/2011 Document Reviewed: 03/15/2007 [facility] Patient Information 2014 [place], [place].   ________________________________________________________________________

## 2014-06-06 ENCOUNTER — Ambulatory Visit: Payer: Self-pay | Admitting: Orthopedic Surgery

## 2014-06-06 NOTE — Pre-Procedure Instructions (Signed)
06-06-14 0910 Positive PCR screen Staph aureus- Rx. For Mupirocin called to [facility][phone] "Alex"). Pt. To use as directed.

## 2014-06-07 ENCOUNTER — Encounter (HOSPITAL_COMMUNITY)
Admission: RE | Disposition: A | Discharge status: Home / Self Care | Payer: Self-pay | Source: Ambulatory Visit | Attending: Specialist

## 2014-06-07 ENCOUNTER — Ambulatory Visit (HOSPITAL_COMMUNITY): Payer: Federal, State, Local not specified - PPO

## 2014-06-07 ENCOUNTER — Ambulatory Visit (HOSPITAL_COMMUNITY): Payer: Federal, State, Local not specified - PPO | Admitting: Registered Nurse

## 2014-06-07 ENCOUNTER — Encounter (HOSPITAL_COMMUNITY): Payer: Federal, State, Local not specified - PPO | Admitting: Registered Nurse

## 2014-06-07 ENCOUNTER — Encounter (HOSPITAL_COMMUNITY): Payer: Self-pay | Admitting: Anesthesiology

## 2014-06-07 ENCOUNTER — Ambulatory Visit (HOSPITAL_COMMUNITY)
Admission: RE | Admit: 2014-06-07 | Discharge: 2014-06-08 | Disposition: A | Discharge status: Home / Self Care | Payer: Federal, State, Local not specified - PPO | Source: Ambulatory Visit | Attending: Specialist | Admitting: Specialist

## 2014-06-07 DIAGNOSIS — M48062 Spinal stenosis, lumbar region with neurogenic claudication: Secondary | ICD-10-CM | POA: Diagnosis present

## 2014-06-07 DIAGNOSIS — K219 Gastro-esophageal reflux disease without esophagitis: Secondary | ICD-10-CM | POA: Diagnosis not present

## 2014-06-07 DIAGNOSIS — G4731 Primary central sleep apnea: Secondary | ICD-10-CM | POA: Diagnosis not present

## 2014-06-07 DIAGNOSIS — M7130 Other bursal cyst, unspecified site: Secondary | ICD-10-CM | POA: Insufficient documentation

## 2014-06-07 DIAGNOSIS — Z6828 Body mass index (BMI) 28.0-28.9, adult: Secondary | ICD-10-CM | POA: Insufficient documentation

## 2014-06-07 DIAGNOSIS — F341 Dysthymic disorder: Secondary | ICD-10-CM | POA: Insufficient documentation

## 2014-06-07 DIAGNOSIS — M109 Gout, unspecified: Secondary | ICD-10-CM | POA: Insufficient documentation

## 2014-06-07 DIAGNOSIS — R7989 Other specified abnormal findings of blood chemistry: Secondary | ICD-10-CM | POA: Diagnosis not present

## 2014-06-07 DIAGNOSIS — Z9884 Bariatric surgery status: Secondary | ICD-10-CM | POA: Diagnosis not present

## 2014-06-07 DIAGNOSIS — E785 Hyperlipidemia, unspecified: Secondary | ICD-10-CM | POA: Insufficient documentation

## 2014-06-07 DIAGNOSIS — M48061 Spinal stenosis, lumbar region without neurogenic claudication: Secondary | ICD-10-CM | POA: Diagnosis present

## 2014-06-07 DIAGNOSIS — E538 Deficiency of other specified B group vitamins: Secondary | ICD-10-CM | POA: Insufficient documentation

## 2014-06-07 DIAGNOSIS — E669 Obesity, unspecified: Secondary | ICD-10-CM | POA: Insufficient documentation

## 2014-06-07 HISTORY — DX: Other specified postprocedural states: Z98.890

## 2014-06-07 HISTORY — PX: LUMBAR LAMINECTOMY/DECOMPRESSION MICRODISCECTOMY: SHX5026

## 2014-06-07 LAB — GLUCOSE, CAPILLARY
GLUCOSE-CAPILLARY: 254 mg/dL — AB (ref 70–99)
GLUCOSE-CAPILLARY: 122 mg/dL — AB (ref 70–99)
Glucose-Capillary: 101 mg/dL — ABNORMAL HIGH (ref 70–99)
Glucose-Capillary: 197 mg/dL — ABNORMAL HIGH (ref 70–99)

## 2014-06-07 SURGERY — LUMBAR LAMINECTOMY/DECOMPRESSION MICRODISCECTOMY 1 LEVEL
Anesthesia: General | Laterality: Left

## 2014-06-07 MED ORDER — HYDROMORPHONE HCL 1 MG/ML IJ SOLN
0.5000 mg | INTRAMUSCULAR | Status: DC | PRN
  Administered 2014-06-07: 1 mg via INTRAVENOUS
  Filled 2014-06-07: qty 1

## 2014-06-07 MED ORDER — INSULIN ASPART 100 UNIT/ML ~~LOC~~ SOLN
0.0000 [IU] | Freq: Three times a day (TID) | SUBCUTANEOUS | Status: DC
  Administered 2014-06-07: 8 [IU] via SUBCUTANEOUS

## 2014-06-07 MED ORDER — NEOSTIGMINE METHYLSULFATE 10 MG/10ML IV SOLN
INTRAVENOUS | Status: DC | PRN
  Administered 2014-06-07: 5 mg via INTRAVENOUS

## 2014-06-07 MED ORDER — CALCIUM CITRATE 950 (200 CA) MG PO TABS
1.0000 | ORAL_TABLET | Freq: Every day | ORAL | Status: DC
  Administered 2014-06-08: 200 mg via ORAL
  Filled 2014-06-07: qty 1

## 2014-06-07 MED ORDER — PROPOFOL 10 MG/ML IV BOLUS
INTRAVENOUS | Status: DC | PRN
  Administered 2014-06-07: 190 mg via INTRAVENOUS

## 2014-06-07 MED ORDER — LIDOCAINE HCL (CARDIAC) 20 MG/ML IV SOLN
INTRAVENOUS | Status: AC
Start: 2014-06-07 — End: 2014-06-07
  Filled 2014-06-07: qty 5

## 2014-06-07 MED ORDER — ALUM & MAG HYDROXIDE-SIMETH 200-200-20 MG/5ML PO SUSP: 30.0000 mL | Freq: Four times a day (QID) | ORAL | Status: DC | PRN

## 2014-06-07 MED ORDER — CEFAZOLIN SODIUM-DEXTROSE 2-3 GM-% IV SOLR
INTRAVENOUS | Status: AC
  Filled 2014-06-07: qty 50

## 2014-06-07 MED ORDER — BISACODYL 5 MG PO TBEC: 5.0000 mg | DELAYED_RELEASE_TABLET | Freq: Every day | ORAL | Status: DC | PRN

## 2014-06-07 MED ORDER — EPHEDRINE SULFATE 50 MG/ML IJ SOLN
INTRAMUSCULAR | Status: AC
  Filled 2014-06-07: qty 1

## 2014-06-07 MED ORDER — TAMSULOSIN HCL 0.4 MG PO CAPS
0.4000 mg | ORAL_CAPSULE | Freq: Every day | ORAL | Status: DC
  Administered 2014-06-08: 0.4 mg via ORAL
  Filled 2014-06-07 (×2): qty 1

## 2014-06-07 MED ORDER — MIDAZOLAM HCL 5 MG/5ML IJ SOLN
INTRAMUSCULAR | Status: DC | PRN
  Administered 2014-06-07 (×2): 1 mg via INTRAVENOUS

## 2014-06-07 MED ORDER — PHENOL 1.4 % MT LIQD: 1.0000 | OROMUCOSAL | Status: DC | PRN

## 2014-06-07 MED ORDER — DEXAMETHASONE SODIUM PHOSPHATE 10 MG/ML IJ SOLN
INTRAMUSCULAR | Status: DC | PRN
  Administered 2014-06-07: 10 mg via INTRAVENOUS

## 2014-06-07 MED ORDER — LACTATED RINGERS IV SOLN
INTRAVENOUS | Status: DC
  Administered 2014-06-07: 08:00:00 via INTRAVENOUS

## 2014-06-07 MED ORDER — MIDAZOLAM HCL 2 MG/2ML IJ SOLN
INTRAMUSCULAR | Status: AC
  Filled 2014-06-07: qty 2

## 2014-06-07 MED ORDER — OXYCODONE-ACETAMINOPHEN 7.5-325 MG PO TABS: 1.0000 | ORAL_TABLET | ORAL | Status: DC | PRN

## 2014-06-07 MED ORDER — HYDROMORPHONE HCL 1 MG/ML IJ SOLN
INTRAMUSCULAR | Status: AC
  Filled 2014-06-07: qty 1

## 2014-06-07 MED ORDER — OXYCODONE-ACETAMINOPHEN 5-325 MG PO TABS
1.0000 | ORAL_TABLET | ORAL | Status: DC | PRN
Start: 2014-06-07 — End: 2014-06-08
  Administered 2014-06-07 – 2014-06-08 (×3): 2 via ORAL
  Filled 2014-06-07 (×3): qty 2

## 2014-06-07 MED ORDER — PROMETHAZINE HCL 25 MG/ML IJ SOLN: 6.2500 mg | INTRAMUSCULAR | Status: DC | PRN

## 2014-06-07 MED ORDER — PHENYLEPHRINE HCL 10 MG/ML IJ SOLN
20.0000 mg | INTRAVENOUS | Status: DC | PRN
  Administered 2014-06-07: 15 ug/min via INTRAVENOUS

## 2014-06-07 MED ORDER — FAMOTIDINE IN NACL 20-0.9 MG/50ML-% IV SOLN
20.0000 mg | Freq: Once | INTRAVENOUS | Status: AC
  Administered 2014-06-07: 20 mg via INTRAVENOUS
  Filled 2014-06-07: qty 50

## 2014-06-07 MED ORDER — SODIUM CHLORIDE 0.9 % IJ SOLN
INTRAMUSCULAR | Status: AC
Start: 2014-06-07 — End: 2014-06-07
  Filled 2014-06-07: qty 10

## 2014-06-07 MED ORDER — POTASSIUM CHLORIDE IN NACL 20-0.9 MEQ/L-% IV SOLN
INTRAVENOUS | Status: DC
  Administered 2014-06-07 – 2014-06-08 (×2): via INTRAVENOUS
  Filled 2014-06-07 (×2): qty 1000

## 2014-06-07 MED ORDER — SENNOSIDES-DOCUSATE SODIUM 8.6-50 MG PO TABS: 1.0000 | ORAL_TABLET | Freq: Every evening | ORAL | Status: DC | PRN

## 2014-06-07 MED ORDER — ONDANSETRON HCL 4 MG/2ML IJ SOLN
4.0000 mg | INTRAMUSCULAR | Status: DC | PRN
  Administered 2014-06-07: 4 mg via INTRAVENOUS
  Filled 2014-06-07: qty 2

## 2014-06-07 MED ORDER — CHOLECALCIFEROL 250 MCG (10000 UT) PO CAPS: 1.0000 | ORAL_CAPSULE | Freq: Every day | ORAL | Status: DC

## 2014-06-07 MED ORDER — CLINDAMYCIN PHOSPHATE 900 MG/50ML IV SOLN
INTRAVENOUS | Status: AC
  Filled 2014-06-07: qty 50

## 2014-06-07 MED ORDER — BUPIVACAINE-EPINEPHRINE 0.5% -1:200000 IJ SOLN
INTRAMUSCULAR | Status: AC
  Filled 2014-06-07: qty 1

## 2014-06-07 MED ORDER — SODIUM CHLORIDE 0.9 % IJ SOLN: 3.0000 mL | INTRAMUSCULAR | Status: DC | PRN

## 2014-06-07 MED ORDER — PHENYLEPHRINE 40 MCG/ML (10ML) SYRINGE FOR IV PUSH (FOR BLOOD PRESSURE SUPPORT)
PREFILLED_SYRINGE | INTRAVENOUS | Status: AC
  Filled 2014-06-07: qty 10

## 2014-06-07 MED ORDER — HYDROMORPHONE HCL 1 MG/ML IJ SOLN
0.2500 mg | INTRAMUSCULAR | Status: DC | PRN
  Administered 2014-06-07 (×4): 0.5 mg via INTRAVENOUS

## 2014-06-07 MED ORDER — MAGNESIUM CITRATE PO SOLN
1.0000 | Freq: Once | ORAL | Status: AC | PRN
Start: 2014-06-07 — End: 2014-06-07

## 2014-06-07 MED ORDER — GLYCOPYRROLATE 0.2 MG/ML IJ SOLN
INTRAMUSCULAR | Status: AC
  Filled 2014-06-07: qty 4

## 2014-06-07 MED ORDER — PHENYLEPHRINE HCL 10 MG/ML IJ SOLN
INTRAMUSCULAR | Status: DC | PRN
  Administered 2014-06-07 (×2): 80 ug via INTRAVENOUS

## 2014-06-07 MED ORDER — FENTANYL CITRATE 0.05 MG/ML IJ SOLN
INTRAMUSCULAR | Status: DC | PRN
  Administered 2014-06-07 (×3): 25 ug via INTRAVENOUS
  Administered 2014-06-07 (×2): 50 ug via INTRAVENOUS
  Administered 2014-06-07: 25 ug via INTRAVENOUS

## 2014-06-07 MED ORDER — LAMOTRIGINE 100 MG PO TABS
100.0000 mg | ORAL_TABLET | Freq: Every day | ORAL | Status: DC
  Administered 2014-06-07: 100 mg via ORAL
  Filled 2014-06-07 (×3): qty 1

## 2014-06-07 MED ORDER — LAMOTRIGINE 200 MG PO TABS
200.0000 mg | ORAL_TABLET | Freq: Every day | ORAL | Status: DC
  Administered 2014-06-08: 200 mg via ORAL
  Filled 2014-06-07: qty 1

## 2014-06-07 MED ORDER — SODIUM CHLORIDE 0.9 % IJ SOLN
3.0000 mL | Freq: Two times a day (BID) | INTRAMUSCULAR | Status: DC
  Administered 2014-06-07: 3 mL via INTRAVENOUS

## 2014-06-07 MED ORDER — THROMBIN 5000 UNITS EX SOLR
CUTANEOUS | Status: DC | PRN
  Administered 2014-06-07 (×2): via TOPICAL

## 2014-06-07 MED ORDER — MENTHOL 3 MG MT LOZG: 1.0000 | LOZENGE | OROMUCOSAL | Status: DC | PRN

## 2014-06-07 MED ORDER — SODIUM CHLORIDE 0.9 % IR SOLN
Status: AC
  Filled 2014-06-07: qty 1

## 2014-06-07 MED ORDER — PROPOFOL 10 MG/ML IV BOLUS
INTRAVENOUS | Status: AC
  Filled 2014-06-07: qty 20

## 2014-06-07 MED ORDER — SODIUM CHLORIDE 0.9 % IR SOLN
Status: DC | PRN
  Administered 2014-06-07: 11:00:00

## 2014-06-07 MED ORDER — DOCUSATE SODIUM 100 MG PO CAPS: 100.0000 mg | ORAL_CAPSULE | Freq: Two times a day (BID) | ORAL | Status: DC | PRN

## 2014-06-07 MED ORDER — ACETAMINOPHEN 650 MG RE SUPP: 650.0000 mg | RECTAL | Status: DC | PRN

## 2014-06-07 MED ORDER — B COMPLEX-C PO TABS
1.0000 | ORAL_TABLET | Freq: Every day | ORAL | Status: DC
  Administered 2014-06-08: 1 via ORAL
  Filled 2014-06-07: qty 1

## 2014-06-07 MED ORDER — LIDOCAINE HCL (CARDIAC) 20 MG/ML IV SOLN
INTRAVENOUS | Status: DC | PRN
  Administered 2014-06-07: 100 mg via INTRAVENOUS

## 2014-06-07 MED ORDER — PHENYLEPHRINE HCL 10 MG/ML IJ SOLN
INTRAMUSCULAR | Status: AC
  Filled 2014-06-07: qty 2

## 2014-06-07 MED ORDER — ZOLPIDEM TARTRATE 10 MG PO TABS
10.0000 mg | ORAL_TABLET | Freq: Every evening | ORAL | Status: DC | PRN
  Administered 2014-06-07: 10 mg via ORAL
  Filled 2014-06-07: qty 1

## 2014-06-07 MED ORDER — BUPIVACAINE-EPINEPHRINE 0.5% -1:200000 IJ SOLN
INTRAMUSCULAR | Status: DC | PRN
  Administered 2014-06-07: 15 mL

## 2014-06-07 MED ORDER — ACETAMINOPHEN 10 MG/ML IV SOLN
1000.0000 mg | Freq: Once | INTRAVENOUS | Status: AC
  Administered 2014-06-07: 1000 mg via INTRAVENOUS
  Filled 2014-06-07: qty 100

## 2014-06-07 MED ORDER — FENTANYL CITRATE 0.05 MG/ML IJ SOLN
INTRAMUSCULAR | Status: AC
  Filled 2014-06-07: qty 5

## 2014-06-07 MED ORDER — NEOSTIGMINE METHYLSULFATE 10 MG/10ML IV SOLN
INTRAVENOUS | Status: AC
  Filled 2014-06-07: qty 1

## 2014-06-07 MED ORDER — ROCURONIUM BROMIDE 100 MG/10ML IV SOLN
INTRAVENOUS | Status: AC
  Filled 2014-06-07: qty 1

## 2014-06-07 MED ORDER — GLYCOPYRROLATE 0.2 MG/ML IJ SOLN
INTRAMUSCULAR | Status: DC | PRN
  Administered 2014-06-07: .8 mg via INTRAVENOUS

## 2014-06-07 MED ORDER — THROMBIN 5000 UNITS EX SOLR
CUTANEOUS | Status: AC
  Filled 2014-06-07: qty 5000

## 2014-06-07 MED ORDER — DOCUSATE SODIUM 100 MG PO CAPS
100.0000 mg | ORAL_CAPSULE | Freq: Two times a day (BID) | ORAL | Status: DC
  Administered 2014-06-07 – 2014-06-08 (×2): 100 mg via ORAL

## 2014-06-07 MED ORDER — INSULIN ASPART 100 UNIT/ML ~~LOC~~ SOLN: 0.0000 [IU] | Freq: Every day | SUBCUTANEOUS | Status: DC

## 2014-06-07 MED ORDER — METHOCARBAMOL 500 MG PO TABS: 750.0000 mg | ORAL_TABLET | Freq: Three times a day (TID) | ORAL | Status: DC | PRN

## 2014-06-07 MED ORDER — CLINDAMYCIN PHOSPHATE 900 MG/50ML IV SOLN
900.0000 mg | INTRAVENOUS | Status: AC
  Administered 2014-06-07: 900 mg via INTRAVENOUS

## 2014-06-07 MED ORDER — CEFAZOLIN SODIUM-DEXTROSE 2-3 GM-% IV SOLR
2.0000 g | Freq: Three times a day (TID) | INTRAVENOUS | Status: AC
  Administered 2014-06-07 – 2014-06-08 (×3): 2 g via INTRAVENOUS
  Filled 2014-06-07 (×3): qty 50

## 2014-06-07 MED ORDER — METHOCARBAMOL 1000 MG/10ML IJ SOLN
500.0000 mg | Freq: Four times a day (QID) | INTRAMUSCULAR | Status: DC | PRN
  Administered 2014-06-07: 500 mg via INTRAVENOUS
  Filled 2014-06-07: qty 5

## 2014-06-07 MED ORDER — ONDANSETRON HCL 4 MG/2ML IJ SOLN
INTRAMUSCULAR | Status: AC
  Filled 2014-06-07: qty 2

## 2014-06-07 MED ORDER — ROCURONIUM BROMIDE 100 MG/10ML IV SOLN
INTRAVENOUS | Status: DC | PRN
  Administered 2014-06-07: 5 mg via INTRAVENOUS
  Administered 2014-06-07: 50 mg via INTRAVENOUS

## 2014-06-07 MED ORDER — METHOCARBAMOL 500 MG PO TABS
500.0000 mg | ORAL_TABLET | Freq: Four times a day (QID) | ORAL | Status: DC | PRN
  Administered 2014-06-07 – 2014-06-08 (×2): 500 mg via ORAL
  Filled 2014-06-07 (×2): qty 1

## 2014-06-07 MED ORDER — ADULT MULTIVITAMIN W/MINERALS CH
1.0000 | ORAL_TABLET | Freq: Every day | ORAL | Status: DC
  Administered 2014-06-08: 1 via ORAL
  Filled 2014-06-07: qty 1

## 2014-06-07 MED ORDER — HYDROCODONE-ACETAMINOPHEN 5-325 MG PO TABS
1.0000 | ORAL_TABLET | ORAL | Status: DC | PRN
  Filled 2014-06-07: qty 1

## 2014-06-07 MED ORDER — ONDANSETRON 4 MG PO TBDP: 4.0000 mg | ORAL_TABLET | Freq: Three times a day (TID) | ORAL | Status: DC | PRN

## 2014-06-07 MED ORDER — ATROPINE SULFATE 0.4 MG/ML IJ SOLN
INTRAMUSCULAR | Status: AC
  Filled 2014-06-07: qty 2

## 2014-06-07 MED ORDER — ESCITALOPRAM OXALATE 20 MG PO TABS
20.0000 mg | ORAL_TABLET | Freq: Every morning | ORAL | Status: DC
  Administered 2014-06-08: 20 mg via ORAL
  Filled 2014-06-07: qty 1

## 2014-06-07 MED ORDER — ACETAMINOPHEN 325 MG PO TABS: 650.0000 mg | ORAL_TABLET | ORAL | Status: DC | PRN

## 2014-06-07 MED ORDER — SODIUM CHLORIDE 0.9 % IV SOLN: 250.0000 mL | INTRAVENOUS | Status: DC

## 2014-06-07 MED ORDER — EPHEDRINE SULFATE 50 MG/ML IJ SOLN
INTRAMUSCULAR | Status: DC | PRN
  Administered 2014-06-07 (×3): 5 mg via INTRAVENOUS

## 2014-06-07 MED ORDER — ONDANSETRON HCL 4 MG PO TABS: 4.0000 mg | ORAL_TABLET | Freq: Three times a day (TID) | ORAL | Status: DC | PRN

## 2014-06-07 MED ORDER — CEFAZOLIN SODIUM-DEXTROSE 2-3 GM-% IV SOLR
2.0000 g | INTRAVENOUS | Status: AC
  Administered 2014-06-07: 2 g via INTRAVENOUS

## 2014-06-07 MED ORDER — AMITRIPTYLINE HCL 50 MG PO TABS
50.0000 mg | ORAL_TABLET | Freq: Every evening | ORAL | Status: DC | PRN
  Filled 2014-06-07: qty 1

## 2014-06-07 SURGICAL SUPPLY — 44 items
BAG SPEC THK2 15X12 ZIP CLS (MISCELLANEOUS)
BAG ZIPLOCK 12X15 (MISCELLANEOUS) IMPLANT
CLEANER TIP ELECTROSURG 2X2 (MISCELLANEOUS) ×2 IMPLANT
CLOTH 2% CHLOROHEXIDINE 3PK (PERSONAL CARE ITEMS) ×2 IMPLANT
DRAPE MICROSCOPE LEICA (MISCELLANEOUS) ×2 IMPLANT
DRAPE POUCH INSTRU U-SHP 10X18 (DRAPES) ×2 IMPLANT
DRAPE SURG 17X11 SM STRL (DRAPES) ×2 IMPLANT
DRAPE UTILITY XL STRL (DRAPES) ×2 IMPLANT
DRSG AQUACEL AG ADV 3.5X 4 (GAUZE/BANDAGES/DRESSINGS) IMPLANT
DRSG AQUACEL AG ADV 3.5X 6 (GAUZE/BANDAGES/DRESSINGS) ×1 IMPLANT
DURAPREP 26ML APPLICATOR (WOUND CARE) ×2 IMPLANT
DURASEAL SPINE SEALANT 3ML (MISCELLANEOUS) IMPLANT
ELECT BLADE TIP CTD 4 INCH (ELECTRODE) IMPLANT
ELECT REM PT RETURN 9FT ADLT (ELECTROSURGICAL) ×2
ELECTRODE REM PT RTRN 9FT ADLT (ELECTROSURGICAL) ×1 IMPLANT
GLOVE BIOGEL PI IND STRL 7.5 (GLOVE) ×1 IMPLANT
GLOVE BIOGEL PI INDICATOR 7.5 (GLOVE) ×1
GLOVE SURG SS PI 7.5 STRL IVOR (GLOVE) ×2 IMPLANT
GLOVE SURG SS PI 8.0 STRL IVOR (GLOVE) ×4 IMPLANT
GOWN STRL REUS W/TWL XL LVL3 (GOWN DISPOSABLE) ×5 IMPLANT
IV CATH 14GX2 1/4 (CATHETERS) IMPLANT
KIT BASIN OR (CUSTOM PROCEDURE TRAY) ×2 IMPLANT
KIT POSITIONING SURG ANDREWS (MISCELLANEOUS) ×2 IMPLANT
MANIFOLD NEPTUNE II (INSTRUMENTS) ×2 IMPLANT
NEEDLE SPNL 18GX3.5 QUINCKE PK (NEEDLE) ×4 IMPLANT
PATTIES SURGICAL .5 X.5 (GAUZE/BANDAGES/DRESSINGS) ×2 IMPLANT
PATTIES SURGICAL .75X.75 (GAUZE/BANDAGES/DRESSINGS) IMPLANT
PATTIES SURGICAL 1X1 (DISPOSABLE) IMPLANT
SPONGE SURGIFOAM ABS GEL 100 (HEMOSTASIS) ×2 IMPLANT
STAPLER VISISTAT (STAPLE) IMPLANT
STRIP CLOSURE SKIN 1/2X4 (GAUZE/BANDAGES/DRESSINGS) ×2 IMPLANT
SUT NURALON 4 0 TR CR/8 (SUTURE) IMPLANT
SUT PROLENE 3 0 PS 2 (SUTURE) ×2 IMPLANT
SUT VIC AB 1 CT1 27 (SUTURE) ×2
SUT VIC AB 1 CT1 27XBRD ANTBC (SUTURE) IMPLANT
SUT VIC AB 1-0 CT2 27 (SUTURE) ×2 IMPLANT
SUT VIC AB 2-0 CT1 27 (SUTURE)
SUT VIC AB 2-0 CT1 TAPERPNT 27 (SUTURE) IMPLANT
SUT VIC AB 2-0 CT2 27 (SUTURE) ×2 IMPLANT
SYR 3ML LL SCALE MARK (SYRINGE) IMPLANT
TOWEL OR 17X26 10 PK STRL BLUE (TOWEL DISPOSABLE) ×2 IMPLANT
TOWEL OR NON WOVEN STRL DISP B (DISPOSABLE) ×2 IMPLANT
TRAY LAMINECTOMY (CUSTOM PROCEDURE TRAY) ×2 IMPLANT
YANKAUER SUCT BULB TIP NO VENT (SUCTIONS) IMPLANT

## 2014-06-07 NOTE — H&P (View-Only) (Signed)
Hector [name] is an 53 y.o. male.   Chief Complaint: back and L leg pain HPI: The patient is a 53 year old male who presents today for follow up of their back. The patient is being followed for their back pain. They are now 6 month(s) out from most recent flare up. Symptoms reported today include: pain (low back), weakness, numbness (left leg) and leg pain (left). Current treatment includes: home exercise program. The following medication has been used for pain control: Percocet (7.5/325mg ). The patient reports their current pain level to be moderate to severe. The patient presents today following SNRB (left L5). The patient has not gotten any relief of their symptoms with Cortisone injections.  He reports continued pain. No relief from his injection. It actually made it worse. He is having more pain that radiates down the leg than in the lower back.  Past Medical History  Diagnosis Date  . Hyperlipidemia   . Abnormal LFTs     improved after gastic bypass  . History of concussion   . Gout   . Perforated ulcer     Surgical repair9/24 2011  . B12 nutritional deficiency   . Closed head injury     laceration  9 12  no loc  vision change  . History of renal stone     Seen in emergency room not urologist  . Diabetes mellitus     resolved with gastric bypass  . Chest pain     hopsitalized 2 14 evaluation  . OBESITY 12/10/2007    Qualifier: Diagnosis of  By: [name] MD, Standley Brooking Gastric bypass   . 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   . Sleep apnea     had gastric bypass/ sa-gone  . GERD (gastroesophageal reflux disease)     no meds  . DDD (degenerative disc disease)   . OBSTRUCTIVE SLEEP APNEA 07/22/2007    Qualifier: Diagnosis of  By: [name] RN, Doroteo Bradford    . DIABETES MELLITUS, TYPE II 04/06/2007    Qualifier: Diagnosis of  By: [name] MD, Standley Brooking Improved and off meds  since gastric bypass   . OSA (obstructive sleep apnea) 12/12/2013  . 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 Vasectomy    . Nasal septoplasty w/ turbinoplasty      x 2  . Gastric bypass  11/06/09    beginning weight 267  . Epidural block injection      by Dr. Nelva Bush  . Surgerical repair of stomach ulcer  06/08/10    per  . Hiatal hernia repair N/A 01/20/2013    Procedure: LAPAROSCOPIC REPAIR OF INTERNAL HERNIA;  Surgeon: Shann Medal, MD;  Location: [facility];  Service: General;  Laterality: N/A;  . Back surgery  2012    Dr. Dorina Hoyer disk  . Hernia repair  1610    umbilical   . Tonsillectomy    . Uvulopalatopharyngoplasty  2007    with tonsils  . Shoulder arthroscopy  2014    rt  . Carpal tunnel release  2008    left-ulnar nerve decomp  . Mass excision Right 04/18/2013    Procedure: RIGHT SMALL FINGER SKIN LESION EXCISION;  Surgeon: Jolyn Nap, MD;  Location: [facility];  Service: Orthopedics;  Laterality: Right;    Family  History  Problem Relation Age of Onset  . Uterine cancer Mother   . Hypertension Mother   . Cervical cancer Mother   . Heart disease Father   . COPD Father   . Colon cancer Maternal Uncle   . Colon cancer Paternal Aunt    Social History:  reports that he has never smoked. He has never used smokeless tobacco. He reports that he drinks alcohol. He reports that he does not use illicit drugs.  Allergies:  Allergies  Allergen Reactions  . Aspirin     REACTION: Pt had a allergic reaction as a perforated ulcer  . Hydrocodone Other (See Comments)    headache     (Not in a hospital admission)  No results found for this or any previous visit (from the past 48 hour(s)). No results found.  Review of Systems  Constitutional: Negative.   HENT: Negative.   Eyes: Negative.   Respiratory: Negative.   Cardiovascular: Negative.   Gastrointestinal: Negative.   Genitourinary:  Negative.   Musculoskeletal: Positive for back pain.  Skin: Negative.   Neurological: Positive for sensory change and focal weakness.  Psychiatric/Behavioral: Negative.     There were no vitals taken for this visit. Physical Exam  Constitutional: He is oriented to person, place, and time. He appears well-developed and well-nourished.  HENT:  Head: Normocephalic and atraumatic.  Eyes: Conjunctivae and EOM are normal. Pupils are equal, round, and reactive to light.  Neck: Normal range of motion. Neck supple.  Cardiovascular: Normal rate and regular rhythm.   Respiratory: Effort normal and breath sounds normal.  GI: Soft. Bowel sounds are normal.  Musculoskeletal:  On exam, straight leg raise produces buttock, thigh, and calf pain on the left and negative on the right. He has 4+/5 EHL. He has pain with extension and flexion of the lumbar spine. Lumbar spine exam reveals no evidence of soft tissue swelling, ecchymosis or deformity. On palpation there is no flank pain with percussion. Nontender over the trochanters.  Neurological: He is alert and oriented to person, place, and time. He has normal reflexes.  Skin: Skin is warm.  Psychiatric: He has a normal mood and affect.    MRI demonstrates severe facet arthrosis at 5-1. Neural foraminal stenosis secondary to synovial cyst. Hypertrophy.  Assessment/Plan Spinal stenosis L5-S1 left, ganglion cyst 1. Persistent L5 radiculopathy secondary to neural foraminal stenosis, facet cyst present in the 5 root . 2. Underlying facet arthropathy.  1. We have discussed options of living with the symptoms verses lumbar fusion verses foraminotomy, excision of the cyst, and we might be able to do an intraoperative rhizotomy over the facet joint if I denervate the facet joint as well. He is not interested in the fusion. He cannot live with his symptoms. He is requiring Percocet frequently. 2. We did discuss the foraminotomy, excision of the cyst  but we discussed possibility of residual symptoms requiring fusion. He understands this. This is addressed with his leg pain, not back pain. We spent considerable time discussing all of these issues. We will have him undergo preop clearance. I had an extensive discussion of the risks and benefits of the lumbar decompression with the patient including bleeding, infection, damage to neurovascular structures, epidural fibrosis, CSF leak requiring repair. We also discussed increase in pain, adjacent segment disease, recurrent disc herniation, need for future surgery including repeat decompression and/or fusion. We also discussed risks of postoperative hematoma, paralysis, anesthetic complications including DVT, PE, death, or cardiopulmonary dysfunction. In addition, the perioperative  and postoperative courses were discussed in detail including the rehabilitative time and return to functional activity and work. I provided the patient with an illustrated handout and utilized the appropriate surgical models. I spent over 25 minutes discussing all of these issues, reviewing his options in detail.  BISSELL, JACLYN M. PA-C for Dr. Tonita Cong 05/25/2014, 7:33 AM

## 2014-06-07 NOTE — Discharge Instructions (Signed)
Walk As Tolerated utilizing back precautions.  No bending, twisting, or lifting.  No driving for 2 weeks.   Aquacel dressing in place. May shower with aquacel dressing in place. If the dressing peels at the edges and begins to fall off or if it becomes saturated, may remove aquacel dressing and place gauze and tape dressing which should be kept clean and dry and changed daily. Do not remove steri-strips if they are present. See Dr. Tonita Cong in office in 10 to 14 days. Begin taking aspirin 81mg  per day starting 4 days after your surgery if not allergic to aspirin or on another blood thinner. Walk daily even outside. Use a cane or walker only if necessary. Avoid sitting on soft sofas.

## 2014-06-07 NOTE — Brief Op Note (Signed)
06/07/2014  10:54 AM  PATIENT:  [name]  53 y.o. male  PRE-OPERATIVE DIAGNOSIS:  spinal stenosis L5 - S1 on the left and ganglion cyst  POST-OPERATIVE DIAGNOSIS:  spinal stenosis L5 - S1 on the left and ganglion cyst  PROCEDURE:  Procedure(s): MICRO LUMBAR DECOMPRESSION L5 - S1 ON THE LEFT/L5 FORAMINOTOMY/EXCISION SYNOVIAL CYST  1 LEVEL (Left)  SURGEON:  Surgeon(s) and Role:    * [name], MD - Primary  PHYSICIAN ASSISTANT:   ASSISTANTS: Bissell   ANESTHESIA:   general  EBL:  Total I/O In: 1000 [I.V.:1000] Out: -   BLOOD ADMINISTERED:none  DRAINS: none   LOCAL MEDICATIONS USED:  MARCAINE     SPECIMEN:  No Specimen  DISPOSITION OF SPECIMEN:  N/A  COUNTS:  YES  TOURNIQUET:  * No tourniquets in log *  DICTATION: .Other Dictation: Dictation Number [id number]  PLAN OF CARE: Admit for overnight observation  PATIENT DISPOSITION:  PACU - hemodynamically stable.   Delay start of Pharmacological VTE agent (>24hrs) due to surgical blood loss or risk of bleeding: yes

## 2014-06-07 NOTE — Progress Notes (Signed)
Pt uses cpap qhs. Called md on call to get order awaiting call back.

## 2014-06-07 NOTE — Plan of Care (Signed)
Problem: Consults Goal: Diagnosis - Spinal Surgery Lumbar Laminectomy (Complex)     

## 2014-06-07 NOTE — Anesthesia Preprocedure Evaluation (Addendum)
Anesthesia Evaluation  Patient identified by MRN, date of birth, ID band Patient awake    Reviewed: Allergy & Precautions, H&P , NPO status , Patient's Chart, lab work & pertinent test results  Airway Mallampati: II TM Distance: >3 FB Neck ROM: Full    Dental no notable dental hx.    Pulmonary asthma , sleep apnea ,  breath sounds clear to auscultation  Pulmonary exam normal       Cardiovascular negative cardio ROS  Rhythm:Regular Rate:Normal     Neuro/Psych  Headaches, PSYCHIATRIC DISORDERS  Neuromuscular disease    GI/Hepatic Neg liver ROS, PUD, GERD-  ,  Endo/Other  diabetes, Type 2  Renal/GU negative Renal ROS  negative genitourinary   Musculoskeletal  (+) Arthritis -,   Abdominal   Peds negative pediatric ROS (+)  Hematology negative hematology ROS (+)   Anesthesia Other Findings   Reproductive/Obstetrics negative OB ROS                          Anesthesia Physical Anesthesia Plan  ASA: II  Anesthesia Plan: General   Post-op Pain Management:    Induction: Intravenous  Airway Management Planned: Oral ETT  Additional Equipment:   Intra-op Plan:   Post-operative Plan: Extubation in OR  Informed Consent: I have reviewed the patients History and Physical, chart, labs and discussed the procedure including the risks, benefits and alternatives for the proposed anesthesia with the patient or authorized representative who has indicated his/her understanding and acceptance.   Dental advisory given  Plan Discussed with: CRNA  Anesthesia Plan Comments:         Anesthesia Quick Evaluation

## 2014-06-07 NOTE — Anesthesia Postprocedure Evaluation (Signed)
  Anesthesia Post-op Note  Patient: Hector [name]  Procedure(s) Performed: Procedure(s) (LRB): MICRO LUMBAR DECOMPRESSION L5 - S1 ON THE LEFT/L5 FORAMINOTOMY/EXCISION SYNOVIAL CYST  1 LEVEL (Left)  Patient Location: PACU  Anesthesia Type: General  Level of Consciousness: awake and alert   Airway and Oxygen Therapy: Patient Spontanous Breathing  Post-op Pain: mild  Post-op Assessment: Post-op Vital signs reviewed, Patient's Cardiovascular Status Stable, Respiratory Function Stable, Patent Airway and No signs of Nausea or vomiting  Last Vitals:  Filed Vitals:   06/07/14 1730  BP: 113/68  Pulse: 70  Temp: 36.6 C  Resp: 16    Post-op Vital Signs: stable   Complications: No apparent anesthesia complications

## 2014-06-07 NOTE — Transfer of Care (Signed)
Immediate Anesthesia Transfer of Care Note  Patient: Hector [name]  Procedure(s) Performed: Procedure(s): MICRO LUMBAR DECOMPRESSION L5 - S1 ON THE LEFT/L5 FORAMINOTOMY/EXCISION SYNOVIAL CYST  1 LEVEL (Left)  Patient Location: PACU  Anesthesia Type:General  Level of Consciousness: awake, alert , oriented and patient cooperative  Airway & Oxygen Therapy: Patient Spontanous Breathing and Patient connected to face mask oxygen  Post-op Assessment: Report given to PACU RN, Post -op Vital signs reviewed and stable and Patient moving all extremities  Post vital signs: Reviewed and stable  Complications: No apparent anesthesia complications

## 2014-06-07 NOTE — Interval H&P Note (Signed)
History and Physical Interval Note:  06/07/2014 7:52 AM  Hector [name]  has presented today for surgery, with the diagnosis of spinal stenosis L5 - S1 on the left and ganglion cyst  The various methods of treatment have been discussed with the patient and family. After consideration of risks, benefits and other options for treatment, the patient has consented to  Procedure(s): MICRO LUMBAR DECOMPRESSION L5 - S1 ON THE LEFT/L5 FORAMINOTOMY/EXCISION SYNOVIAL CYST  1 LEVEL (Left) as a surgical intervention .  The patient's history has been reviewed, patient examined, no change in status, stable for surgery.  I have reviewed the patient's chart and labs.  Questions were answered to the patient's satisfaction.     [name]

## 2014-06-08 DIAGNOSIS — M48061 Spinal stenosis, lumbar region without neurogenic claudication: Secondary | ICD-10-CM | POA: Diagnosis not present

## 2014-06-08 LAB — GLUCOSE, CAPILLARY
GLUCOSE-CAPILLARY: 81 mg/dL (ref 70–99)
Glucose-Capillary: 99 mg/dL (ref 70–99)

## 2014-06-08 LAB — BASIC METABOLIC PANEL
Anion gap: 12 (ref 5–15)
BUN: 8 mg/dL (ref 6–23)
CO2: 26 mEq/L (ref 19–32)
CREATININE: 1.18 mg/dL (ref 0.50–1.35)
Calcium: 9.1 mg/dL (ref 8.4–10.5)
Chloride: 106 mEq/L (ref 96–112)
GFR calc Af Amer: 80 mL/min — ABNORMAL LOW (ref >90)
GFR calc non Af Amer: 69 mL/min — ABNORMAL LOW (ref >90)
GLUCOSE: 103 mg/dL — AB (ref 70–99)
POTASSIUM: 4.7 meq/L (ref 3.7–5.3)
Sodium: 144 mEq/L (ref 137–147)

## 2014-06-08 NOTE — Evaluation (Signed)
Occupational Therapy Evaluation Patient Details Name: Hector [name] MRN: [id number] DOB: [birth year] Today's Date: 06/08/2014    History of Present Illness s/p L5-S1 microdiscectomy   Clinical Impression   All education completed with pt. He is performing ADL at supervision level. No further OT needs.    Follow Up Recommendations  Supervision - Intermittent;No OT follow up    Equipment Recommendations  None recommended by OT    Recommendations for Other Services       Precautions / Restrictions Precautions Precautions: Back Precaution Booklet Issued: Yes (comment) Precaution Comments: Back care handout issued by PT; reviewed all back precautions with pt.  Restrictions Weight Bearing Restrictions: No      Mobility Bed Mobility             Transfers Overall transfer level: Needs assistance Equipment used: None Transfers: Sit to/from Stand Sit to Stand: Supervision         General transfer comment: verbal cues for back precautions.    Balance                                            ADL Overall ADL's : Needs assistance/impaired Eating/Feeding: Independent;Sitting   Grooming: Wash/dry hands;Supervision/safety;Standing   Upper Body Bathing: Set up;Sitting   Lower Body Bathing: Supervison/ safety;Sit to/from stand   Upper Body Dressing : Set up;Sitting   Lower Body Dressing: Supervision/safety;Sit to/from stand   Toilet Transfer: Supervision/safety;Comfort height toilet;Grab bars   Toileting- Clothing Manipulation and Hygiene: Supervision/safety;Sit to/from stand   Tub/ Shower Transfer: Walk-in shower;Supervision/safety     General ADL Comments: Pt able to cross LEs up to don socks, pants, etc. Practiced shower transfer and educated on how to safely step in and out of shower with back precautions. He has a higher toilet and vanity next to but also has a 3in1 if needed once home. reviewed all back precautions and handout with pt.  All education completed.     Vision                     Perception     Praxis      Pertinent Vitals/Pain Pain Assessment: 0-10 Pain Score: 2  Pain Location: back Pain Descriptors / Indicators: Aching Pain Intervention(s): Limited activity within patient's tolerance;Monitored during session;Premedicated before session;Repositioned     Hand Dominance     Extremity/Trunk Assessment Upper Extremity Assessment Upper Extremity Assessment: Overall WFL for tasks assessed      Cervical / Trunk Assessment Cervical / Trunk Assessment: Other exceptions Cervical / Trunk Exceptions: forward head posture   Communication Communication Communication: No difficulties   Cognition Arousal/Alertness: Awake/alert Behavior During Therapy: WFL for tasks assessed/performed Overall Cognitive Status: Within Functional Limits for tasks assessed                     General Comments       Exercises       Shoulder Instructions      Home Living Family/patient expects to be discharged to:: Private residence Living Arrangements: Spouse/significant other Available Help at Discharge: Family Type of Home: House Home Access: Stairs to enter Technical brewer of Steps: 3 Entrance Stairs-Rails: Right Home Layout: One level;Laundry or work area in basement;Able to live on main level with bedroom/bathroom     Bathroom Shower/Tub: Occupational psychologist: Handicapped height  Home Equipment: Bedside commode;Shower seat   Additional Comments: pt i salso a runner      Prior Functioning/Environment Level of Independence: Independent             OT Diagnosis:     OT Problem List:     OT Treatment/Interventions:      OT Goals(Current goals can be found in the care plan section) Acute Rehab OT Goals Patient Stated Goal: return to work  OT Frequency:     Barriers to D/C:            Co-evaluation              End of Session    Activity  Tolerance: Patient tolerated treatment well Patient left: in chair;with call bell/phone within reach   Time: 1014-1030 OT Time Calculation (min): 16 min Charges:  OT General Charges $OT Visit: 1 Procedure OT Evaluation $Initial OT Evaluation Tier I: 1 Procedure OT Treatments $Self Care/Home Management : 8-22 mins G-Codes: OT G-codes **NOT FOR INPATIENT CLASS** Functional Assessment Tool Used: clinical judgement Functional Limitation: Self care Self Care Current Status (T9030): At least 1 percent but less than 20 percent impaired, limited or restricted Self Care Goal Status (S9233): At least 1 percent but less than 20 percent impaired, limited or restricted Self Care Discharge Status [phone]): At least 1 percent but less than 20 percent impaired, limited or restricted  [name] [phone] 06/08/2014, 11:28 AM

## 2014-06-08 NOTE — Op Note (Signed)
NAME:  Hector [name], Hector [name] NO.:  [id number]  MEDICAL RECORD NO.:  [id number]  LOCATION:  [facility]                         FACILITY:  [facility]  PHYSICIAN:  Susa Day, M.D.    DATE OF BIRTH:  [birth year]  DATE OF PROCEDURE:  06/07/2014 DATE OF DISCHARGE:                              OPERATIVE REPORT   PREOPERATIVE DIAGNOSES:  Recurrent spinal stenosis, L5-S1 left with synovial cyst and foraminal stenosis.  POSTOPERATIVE DIAGNOSES:  Recurrent spinal stenosis, L5-S1 left with synovial cyst and foraminal stenosis.  PROCEDURE PERFORMED: 1. Redo microlumbar decompression, L5-S1 left. 2. Partial hemifacetectomy, L5-S1 left. 3. Foraminotomies, L5 and S1. 4. Redo for decompression of synovial cyst.  ANESTHESIA:  General.  ASSISTANT:  Cleophas Dunker, PA, who was required for the patient closure, positioning, and gentle intermittent neural traction.  BRIEF HISTORY AND INDICATION:  This is a 53 year old male with severe left lower extremity radicular pain secondary to facet arthritis and the production of the synovial cyst, extending in the 5 foramen and compressing the 5 root neural tension signs, EHL weakness, history of lumbar decompression in the past.  His lower disk space is actually reconsidered at L5-S1.  He does have transitional vertebrae.  This has denoted the last open disk space.  He had a previous surgery at this level.  He has a transitional or rudimentary disk below that.  There was in this last open disk space with facet arthritis and neural foraminal stenosis compressing the 5 root in addition to the synovial cyst.  He was refractory to rest, activity, modification, corticosteroid injection, analgesics with neural tension signs and EHL weakness.  He was indicated for decompression, foraminotomy, evaluation of the 5 root, excision of synovial cyst.  Risks and benefits were discussed including bleeding, infection, damage to neurovascular structure, DVT,  PE, anesthetic complications, etc., as well as residual back pain from the facet arthritis possibly requiring fusion in the future.  TECHNIQUE:  With the patient supine position, after induction of adequate general anesthesia, 2 g Kefzol and 900 mg of clindamycin, prone on Andrews frame.  All bony prominences well padded.  Lumbar region was prepped and draped in usual sterile fashion.  Two 18-gauge spinal needles utilized to localize the interspace at 5-1.  Incision was made from spinous process of 5-1.  Subcutaneous tissue was dissected. Electrocautery was utilized to achieve hemostasis.  Marcaine with epinephrine was infiltrated in the paraspinous tissues.  Dorsolumbar fascia was divided in line with the skin incision.  Paraspinous muscles were elevated from lamina 5 and S1.  We encountered significant myofascial fibrosis as well as epidural fibrosis.  We confirmed the disk space, meticulously mobilized the previous laminotomy defect mobilizing the thecal sac in the epidural fibrosis.  The patient was placed in Neuro Patties to protect them.  A large laminotomy with 3-mm Kerrison cephalad and laterally in a large facet.  I did cauterize the lamina and the soft tissue attachment to the facet joint.  Used a Land was used to remove the part of the medial aspect of the facet preserving the pars.  Used an osteotome to complete the partial medial hemifacetectomy.  It was significantly large with  osteophytes extending down into the foramen of 5.  After removing the portion in the inferior process, then we used a microcurette to establish a plane between the S1 nerve root and the superior articulating process of the S1.  We decompressed the lateral recess to the medial border of the pedicle, performed a generous foraminotomy of S1 and L5, mobilizing the S and the 5 root.  There was no disk herniation noted here.  Bipolar electrocautery was utilized to achieve hemostasis.  Just prior  to that, we observed over the outer aspect of the facet down to the outside of the pars in the foramen and from the inside using a Woodson retractor. There was synovial fluid that was expressed.  There was some fibrotic tissue from the facet joint that was removed as well as well as the synovial lining.  Following the decompression and the foraminotomy, there was 1 cm of excursion of the S1 nerve root, medial to the pedicle without tension.  We were able to see the above of the pedicle of 5, the entire nerve root, the shoulder, the axilla, and the root extending beyond the pedicle with the neural probe with no compression noted.  It was felt there was an excellent decompression.  We had preserved the pars.  I copiously irrigated the wound with antibiotic irrigation.  No evidence of CSF leakage or active bleeding.  Thrombin-soaked Gelfoam was placed in laminotomy defect.  McCullough retractors were removed. Paraspinous muscles were inspected.  No evidence of active bleeding.  We had brought the operating microscope to assist with a microdecompression, this was then removed.  Closed the subcu with 2-0 and the skin with subcuticular.  Sterile dressing applied.  Placed supine on the hospital bed, extubated without difficulty, and transported to recovery room in satisfactory condition.  The patient tolerated the procedure well.  No complications.  Assistant, Cleophas Dunker, [place].  Minimal blood loss.     Susa Day, M.D.     Geralynn Rile  D:  06/07/2014  T:  06/07/2014  Job:  [id number]

## 2014-06-08 NOTE — Evaluation (Signed)
Physical Therapy Evaluation Patient Details Name: Hector [name] MRN: [id number] DOB: Nov 20, 1960 Today's Date: 06/08/2014   History of Present Illness  s/p L5-S1 microdiscectomy  Clinical Impression  Pt doing well this am; felt better after amb; have reviewed and pt verbalizes back precautions; we have discussed the possibility of using a brace (Aspen LSO)--pt stated Dr. Tonita Cong had mentioned it b/c of the fact that pt plans to return to work very soon. Would agree with plan for brace if Dr. Tonita Cong desires, otherwise ready to D/C from PT standpoint    Follow Up Recommendations No PT follow up    Equipment Recommendations  None recommended by PT    Recommendations for Other Services       Precautions / Restrictions Precautions Precautions: Back Precaution Booklet Issued: Yes (comment) Precaution Comments: discussion with pt and RN about possible back brace, would recommend Aspen LSO if brace ordered as pt plans to return to work on Tuesday; He will be on light duty but does have an active job Restrictions Weight Bearing Restrictions: No      Mobility  Bed Mobility Overal bed mobility: Needs Assistance Bed Mobility: Rolling;Sidelying to Sit Rolling: Supervision Sidelying to sit: Supervision       General bed mobility comments: cues for hand placement  Transfers Overall transfer level: Needs assistance Equipment used: Rolling walker (2 wheeled) Transfers: Sit to/from Stand Sit to Stand: Supervision         General transfer comment: cues for posture, back precautions, hand placement; pt abl eto self correct end of session  Ambulation/Gait Ambulation/Gait assistance: Supervision Ambulation Distance (Feet): 300 Feet Assistive device: None Gait Pattern/deviations: Step-through pattern;Decreased stride length     General Gait Details: diminished push off bil  Stairs Stairs: Yes Stairs assistance: Supervision Stair Management: One rail Right;Forwards;Alternating  pattern Number of Stairs: 4 General stair comments: cues for posture  Wheelchair Mobility    Modified Rankin (Stroke Patients Only)       Balance                                             Pertinent Vitals/Pain Pain Assessment: 0-10 Pain Score: 2  Pain Location: back Pain Intervention(s): Limited activity within patient's tolerance;Monitored during session;Premedicated before session;Repositioned    Home Living Family/patient expects to be discharged to:: Private residence Living Arrangements: Spouse/significant other Available Help at Discharge: Family Type of Home: House Home Access: Stairs to enter Entrance Stairs-Rails: Right Entrance Stairs-Number of Steps: 3 Home Layout: One level;Laundry or work area in basement;Able to live on main level with bedroom/bathroom Home Equipment: Bedside commode;Shower seat Additional Comments: pt i salso a runner    Prior Function Level of Independence: Independent               Hand Dominance        Extremity/Trunk Assessment   Upper Extremity Assessment: Overall WFL for tasks assessed           Lower Extremity Assessment: Overall WFL for tasks assessed      Cervical / Trunk Assessment: Other exceptions  Communication   Communication: No difficulties  Cognition Arousal/Alertness: Awake/alert Behavior During Therapy: WFL for tasks assessed/performed Overall Cognitive Status: Within Functional Limits for tasks assessed                      General Comments  Exercises        Assessment/Plan    PT Assessment Patent does not need any further PT services  PT Diagnosis     PT Problem List    PT Treatment Interventions     PT Goals (Current goals can be found in the Care Plan section) Acute Rehab PT Goals PT Goal Formulation: No goals set, d/c therapy    Frequency     Barriers to discharge        Co-evaluation               End of Session Equipment  Utilized During Treatment: Gait belt Activity Tolerance: Patient tolerated treatment well Patient left: with call bell/phone within reach;in chair Nurse Communication: Mobility status    Functional Assessment Tool Used: clinical judgement Functional Limitation: Mobility: Walking and moving around Mobility: Walking and Moving Around Current Status ([id number]): At least 1 percent but less than 20 percent impaired, limited or restricted Mobility: Walking and Moving Around Goal Status [phone]): 0 percent impaired, limited or restricted Mobility: Walking and Moving Around Discharge Status [phone]): 0 percent impaired, limited or restricted    Time: 0920-0951 PT Time Calculation (min): 31 min   Charges:   PT Evaluation $Initial PT Evaluation Tier I: 1 Procedure PT Treatments $Gait Training: 23-37 mins   PT G Codes:   Functional Assessment Tool Used: clinical judgement Functional Limitation: Mobility: Walking and moving around    [facility] 06/08/2014, 10:40 AM

## 2014-06-08 NOTE — Progress Notes (Signed)
Subjective: 1 Day Post-Op Procedure(s) (LRB): MICRO LUMBAR DECOMPRESSION L5 - S1 ON THE LEFT/L5 FORAMINOTOMY/EXCISION SYNOVIAL CYST  1 LEVEL (Left) Patient reports pain as 4 on 0-10 scale.   Voiding well Objective: Vital signs in last 24 hours: Temp:  [97.7 F (36.5 C)-99.1 F (37.3 C)] 97.7 F (36.5 C) (09/24 0549) Pulse Rate:  [66-91] 73 (09/24 0549) Resp:  [13-20] 16 (09/24 0549) BP: (113-168)/(68-89) 139/73 mmHg (09/24 0549) SpO2:  [92 %-100 %] 98 % (09/24 0549) Weight:  [90.266 kg (199 lb)] 90.266 kg (199 lb) (09/23 0718)  Intake/Output from previous day: 09/23 0701 - 09/24 0700 In: 5133.8 [P.O.:1480; I.V.:3403.8; IV Piggyback:250] Out: 2870 [Urine:2850; Blood:20] Intake/Output this shift:     Recent Labs  06/05/14 1335  HGB 14.2    Recent Labs  06/05/14 1335  WBC 4.5  RBC 4.51  HCT 42.4  PLT 160    Recent Labs  06/05/14 1335 06/08/14 0442  NA 142 144  K 4.7 4.7  CL 105 106  CO2 27 26  BUN 12 8  CREATININE 1.15 1.18  GLUCOSE 128* 103*  CALCIUM 9.3 9.1   No results found for this basename: LABPT, INR,  in the last 72 hours  Neurologically intact Dsg C/D/I No DVT  Assessment/Plan: 1 Day Post-Op Procedure(s) (LRB): MICRO LUMBAR DECOMPRESSION L5 - S1 ON THE LEFT/L5 FORAMINOTOMY/EXCISION SYNOVIAL CYST  1 LEVEL (Left) Advance diet PT OOB D/C later today Rameen Quinney C 06/08/2014, 7:09 AM

## 2014-06-08 NOTE — Progress Notes (Signed)
CSW consulted for SNF placement. PN reviewed .  SNF placement is not being recommended at this time. CSW is available to assist if pt's needs change and SNF placement is required.  Werner Lean LCSW [phone]

## 2014-06-09 ENCOUNTER — Encounter (HOSPITAL_COMMUNITY): Payer: Self-pay | Admitting: Specialist

## 2014-06-10 NOTE — Discharge Summary (Signed)
Physician Discharge Summary   Patient ID: Hector [name] MRN: [id number] DOB/AGE: 03/09/1961 53 y.o.  Admit date: 06/07/2014 Discharge date: 06/10/2014  Primary Diagnosis:   spinal stenosis L5 - S1 on the left and ganglion cyst  Admission Diagnoses:  Past Medical History  Diagnosis Date  . Hyperlipidemia   . Abnormal LFTs     improved after gastic bypass  . History of concussion   . Gout   . Perforated ulcer     Surgical repair9/24 2011  . B12 nutritional deficiency   . Closed head injury     laceration  9 12  no loc  vision change  . History of renal stone     Seen in emergency room not urologist  . Chest pain     hopsitalized 2 14 evaluation  . OBESITY 12/10/2007    Qualifier: Diagnosis of  By: Panosh MD, Wanda K Gastric bypass   . ADJ DISORDER WITH MIXED ANXIETY & DEPRESSED MOOD 08/23/2009    Qualifier: Diagnosis of  By: Panosh MD, Wanda K Now on lamictal and lexapro and elevil for sleep.   . GERD (gastroesophageal reflux disease)     no meds  . DDD (degenerative disc disease)   . 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 Sleep apnea     had gastric bypass/ sa-gone  . OBSTRUCTIVE SLEEP APNEA 07/22/2007    Qualifier: Diagnosis of  By: Johnson RN, Erika    . OSA (obstructive sleep apnea) 12/12/2013  . Diabetes mellitus     resolved with gastric bypass  . DIABETES MELLITUS, TYPE II 04/06/2007    Qualifier: Diagnosis of  By: Panosh MD, Wanda K Improved and off meds since gastric bypass   . History of bunionectomy of left great toe    Discharge Diagnoses:   Active Problems:   Spinal stenosis, lumbar region, with neurogenic claudication  Procedure:  Procedure(s) (LRB): MICRO LUMBAR DECOMPRESSION L5 - S1 ON THE LEFT/L5 FORAMINOTOMY/EXCISION SYNOVIAL CYST  1 LEVEL (Left)   Consults: None  HPI:  see H&P    Laboratory Data: Hospital Outpatient Visit on 06/05/2014  Component Date Value Ref Range Status  . WBC 06/05/2014 4.5  4.0 - 10.5 K/uL Final  . RBC 06/05/2014 4.51   4.22 - 5.81 MIL/uL Final  . Hemoglobin 06/05/2014 14.2  13.0 - 17.0 g/dL Final  . HCT 06/05/2014 42.4  39.0 - 52.0 % Final  . MCV 06/05/2014 94.0  78.0 - 100.0 fL Final  . MCH 06/05/2014 31.5  26.0 - 34.0 pg Final  . MCHC 06/05/2014 33.5  30.0 - 36.0 g/dL Final  . RDW 06/05/2014 13.0  11.5 - 15.5 % Final  . Platelets 06/05/2014 160  150 - 400 K/uL Final  . Sodium 06/05/2014 142  137 - 147 mEq/L Final  . Potassium 06/05/2014 4.7  3.7 - 5.3 mEq/L Final  . Chloride 06/05/2014 105  96 - 112 mEq/L Final  . CO2 06/05/2014 27  19 - 32 mEq/L Final  . Glucose, Bld 06/05/2014 128* 70 - 99 mg/dL Final  . BUN 06/05/2014 12  6 - 23 mg/dL Final  . Creatinine, Ser 06/05/2014 1.15  0.50 - 1.35 mg/dL Final  . Calcium 06/05/2014 9.3  8.4 - 10.5 mg/dL Final  . GFR calc non Af Amer 06/05/2014 72* >90 mL/min Final  . GFR calc Af Amer 06/05/2014 83* >90 mL/min Final   Comment: (NOTE)                            The eGFR has been calculated using the CKD EPI equation.                          This calculation has not been validated in all clinical situations.                          eGFR's persistently <90 mL/min signify possible Chronic Kidney                          Disease.  . Anion gap 06/05/2014 10  5 - 15 Final  . MRSA, PCR 06/05/2014 NEGATIVE  NEGATIVE Final  . Staphylococcus aureus 06/05/2014 POSITIVE* NEGATIVE Final   Comment:                                 The Xpert SA Assay (FDA                          approved for NASAL specimens                          in patients over 21 years of age),                          is one component of                          a comprehensive surveillance                          program.  Test performance has                          been validated by [facility] for patients greater                          than or equal to 1 year old.                          It is not intended                          to diagnose infection nor to                           guide or monitor treatment.   No results found for this basename: HGB,  in the last 72 hours No results found for this basename: WBC, RBC, HCT, PLT,  in the last 72 hours  Recent Labs  06/08/14 0442  NA 144  K 4.7  CL 106  CO2 26  BUN 8  CREATININE 1.18  GLUCOSE 103*  CALCIUM 9.1   No results found for this basename: LABPT, INR,  in the last 72 hours  X-Rays:Dg Chest 2 View  06/05/2014   CLINICAL DATA:  Spinal stenosis. Pre lumbar decompression evaluation.  EXAM: CHEST  2 VIEW    COMPARISON:  01/20/2013.  FINDINGS: Normal sized heart. Clear lungs. Thoracic spine degenerative changes.  IMPRESSION: No acute abnormality.   Electronically Signed   By: Steve  Reid M.D.   On: 06/05/2014 14:56   Dg Lumbar Spine 2-3 Views  06/05/2014   CLINICAL DATA:  Preop lumbar decompression.  EXAM: LUMBAR SPINE - 2-3 VIEW  COMPARISON:  01/20/2011  FINDINGS: Transitional anatomy at the lumbosacral junction with partial sacralization of L5. Numbering is performed to match previous not Re from 2012. Recommend close correlation with any outside cross-sectional imaging.  No acute bony abnormality. Disc spaces are maintained (except L5-S1 with partial sacralization). SI joints are symmetric and unremarkable.  IMPRESSION: No acute bony abnormality. Transitional anatomy as above. Recommend close correlation with any cross-sectional imaging.   Electronically Signed   By: Kevin  Dover M.D.   On: 06/05/2014 14:58   Dg Spine Portable 1 View  06/07/2014   CLINICAL DATA:  Neural impingement in the lower lumbar spine.  EXAM: PORTABLE SPINE - 1 VIEW  COMPARISON:  Radiographs dated 06/07/2014 and 06/05/2014  FINDINGS: Instrument is at L4-5 as compared to the prior 2 exams.  IMPRESSION: Instruments at L4-5.   Electronically Signed   By: Jim  Maxwell M.D.   On: 06/07/2014 10:32   Dg Spine Portable 1 View  06/07/2014   CLINICAL DATA:  Micro lumbar decompression at L5-S 1. Foramina of me. Synovial cyst  excision.  EXAM: PORTABLE SPINE - 1 VIEW  COMPARISON:  06/05/2014  FINDINGS: As on the prior exam, the transitional lumbosacral vertebra is labeled L5. The L5 spinous process is very hypoplastic with evidence of spina bifida occulta.  The top needle type probe is oriented towards the bottom of the L4 spinous process. The lower needle is oriented towards the L5-S1 intervertebral level.  IMPRESSION: 1. Lower needle orientation: L5-S 1. Upper needle orientation: Bottom of the L4 spinous process. 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   Electronically Signed   By: Walt  Liebkemann M.D.   On: 06/07/2014 10:13    EKG: Orders placed during the hospital encounter of 06/05/14  . EKG 12-LEAD  . EKG 12-LEAD     Hospital Course: Patient was admitted to [facility] and taken to the OR and underwent the above state procedure without complications.  Patient tolerated the procedure well and was later transferred to the recovery room and then to the orthopaedic floor for postoperative care.  They were given PO and IV analgesics for pain control following their surgery.  They were given 24 hours of postoperative antibiotics.   PT was consulted postop to assist with mobility and transfers.  The patient was allowed to be WBAT with therapy and was taught back precautions. Discharge planning was consulted to help with postop disposition and equipment needs.  Patient had a good night on the evening of surgery and started to get up OOB with therapy on day one. Patient was seen in rounds and was ready to go home on day one.  They were given discharge instructions and dressing directions.  They were instructed on when to follow up in the office with Dr. Beane.   Diet: low sodium heart healthy Activity:WBAT with Lspine precautions Follow-up:in 10-14 days Disposition - Home Discharged Condition: good   Discharge Instructions   Call MD / Call 911    Complete  by:  As directed   If you experience chest pain or shortness of breath, CALL 911   and be transported to the hospital emergency room.  If you develope a fever above 101 F, pus (white drainage) or increased drainage or redness at the wound, or calf pain, call your surgeon's office.     Constipation Prevention    Complete by:  As directed   Drink plenty of fluids.  Prune juice may be helpful.  You may use a stool softener, such as Colace (over the counter) 100 mg twice a day.  Use MiraLax (over the counter) for constipation as needed.     Diet - low sodium heart healthy    Complete by:  As directed      Increase activity slowly as tolerated    Complete by:  As directed             Medication List    STOP taking these medications       oxyCODONE-acetaminophen 5-325 MG per tablet  Commonly known as:  PERCOCET/ROXICET  Replaced by:  oxyCODONE-acetaminophen 7.5-325 MG per tablet      TAKE these medications       acetaminophen 500 MG tablet  Commonly known as:  TYLENOL  Take 1,000 mg by mouth once as needed for moderate pain.     amitriptyline 50 MG tablet  Commonly known as:  ELAVIL  Take 50 mg by mouth at bedtime as needed for sleep.     B-complex with vitamin C tablet  Take 1 tablet by mouth daily.     calcium citrate 950 MG tablet  Commonly known as:  CALCITRATE - dosed in mg elemental calcium  Take 1 tablet by mouth daily.     Cholecalciferol 10000 UNITS Caps  Take 1 capsule by mouth daily.     docusate sodium 100 MG capsule  Commonly known as:  COLACE  Take 1 capsule (100 mg total) by mouth 2 (two) times daily as needed for mild constipation.     escitalopram 20 MG tablet  Commonly known as:  LEXAPRO  Take 20 mg by mouth every morning.     ferrous sulfate 325 (65 FE) MG tablet  Take 325 mg by mouth daily with breakfast.     indomethacin 50 MG capsule  Commonly known as:  INDOCIN  Take 50 mg by mouth 3 (three) times daily as needed (pain).     lamoTRIgine 100 MG  tablet  Commonly known as:  LAMICTAL  Take 100-200 mg by mouth 2 (two) times daily. Take 235m qam and 1023mqhs.     methocarbamol 500 MG tablet  Commonly known as:  ROBAXIN  Take 1.5 tablets (750 mg total) by mouth every 8 (eight) hours as needed for muscle spasms.     multivitamin with minerals Tabs tablet  Take 1 tablet by mouth daily.     mupirocin nasal ointment 2 %  Commonly known as:  BACTROBAN  Place 1 application into the nose 2 (two) times daily. Use one-half of tube in each nostril twice daily for five (5) days. After application, press sides of nose together and gently massage.     ondansetron 4 MG disintegrating tablet  Commonly known as:  ZOFRAN-ODT  Take 4 mg by mouth every 8 (eight) hours as needed for nausea or vomiting.     oxyCODONE-acetaminophen 7.5-325 MG per tablet  Commonly known as:  PERCOCET  Take 1 tablet by mouth every 4 (four) hours as needed for pain.     tamsulosin 0.4 MG Caps capsule  Commonly known as:  FLOMAX  Take  0.4 mg by mouth daily after breakfast.     zolpidem 10 MG tablet  Commonly known as:  AMBIEN  Take 1 tablet (10 mg total) by mouth at bedtime as needed. For sleep           Follow-up Information   Follow up with [name], MD Today. (For suture removal)    Specialty:  Orthopedic Surgery   Contact information:   [address] [place] [address] [phone]     follow up 10-14 days post-op with Dr. Tonita Cong, suture removal  Signed: Lacie Draft, PA-C Orthopaedic Surgery 06/10/2014, 12:10 PM

## 2014-06-16 ENCOUNTER — Other Ambulatory Visit: Payer: Self-pay | Admitting: Internal Medicine

## 2014-06-19 NOTE — Telephone Encounter (Signed)
Ok ot refill x 6 months if still on this medication

## 2014-06-20 NOTE — Telephone Encounter (Signed)
Sent to the pharmacy by e-scribe. 

## 2014-07-09 ENCOUNTER — Other Ambulatory Visit: Payer: Self-pay | Admitting: Internal Medicine

## 2014-07-09 ENCOUNTER — Other Ambulatory Visit: Payer: Self-pay | Admitting: Family Medicine

## 2014-07-11 NOTE — Telephone Encounter (Signed)
#  20 authorized by [facility].  Sent to the pharmacy by e-scribe.

## 2014-07-14 ENCOUNTER — Emergency Department (HOSPITAL_COMMUNITY)
Admission: EM | Admit: 2014-07-14 | Discharge: 2014-07-14 | Disposition: A | Discharge status: Home / Self Care | Payer: Federal, State, Local not specified - PPO | Attending: Emergency Medicine | Admitting: Emergency Medicine

## 2014-07-14 ENCOUNTER — Emergency Department (HOSPITAL_COMMUNITY): Payer: Federal, State, Local not specified - PPO

## 2014-07-14 ENCOUNTER — Encounter (HOSPITAL_COMMUNITY): Payer: Self-pay | Admitting: Emergency Medicine

## 2014-07-14 DIAGNOSIS — Z9889 Other specified postprocedural states: Secondary | ICD-10-CM | POA: Diagnosis not present

## 2014-07-14 DIAGNOSIS — R1013 Epigastric pain: Secondary | ICD-10-CM | POA: Diagnosis present

## 2014-07-14 DIAGNOSIS — Z9884 Bariatric surgery status: Secondary | ICD-10-CM | POA: Diagnosis not present

## 2014-07-14 DIAGNOSIS — Z87442 Personal history of urinary calculi: Secondary | ICD-10-CM | POA: Insufficient documentation

## 2014-07-14 DIAGNOSIS — K5669 Other intestinal obstruction: Secondary | ICD-10-CM | POA: Insufficient documentation

## 2014-07-14 DIAGNOSIS — K30 Functional dyspepsia: Secondary | ICD-10-CM

## 2014-07-14 DIAGNOSIS — E538 Deficiency of other specified B group vitamins: Secondary | ICD-10-CM | POA: Diagnosis not present

## 2014-07-14 DIAGNOSIS — Z8669 Personal history of other diseases of the nervous system and sense organs: Secondary | ICD-10-CM | POA: Insufficient documentation

## 2014-07-14 DIAGNOSIS — E119 Type 2 diabetes mellitus without complications: Secondary | ICD-10-CM | POA: Insufficient documentation

## 2014-07-14 DIAGNOSIS — Z87828 Personal history of other (healed) physical injury and trauma: Secondary | ICD-10-CM | POA: Insufficient documentation

## 2014-07-14 DIAGNOSIS — Z79899 Other long term (current) drug therapy: Secondary | ICD-10-CM | POA: Insufficient documentation

## 2014-07-14 DIAGNOSIS — R109 Unspecified abdominal pain: Secondary | ICD-10-CM

## 2014-07-14 DIAGNOSIS — M109 Gout, unspecified: Secondary | ICD-10-CM | POA: Diagnosis not present

## 2014-07-14 DIAGNOSIS — E785 Hyperlipidemia, unspecified: Secondary | ICD-10-CM | POA: Diagnosis not present

## 2014-07-14 DIAGNOSIS — Z8719 Personal history of other diseases of the digestive system: Secondary | ICD-10-CM

## 2014-07-14 LAB — I-STAT TROPONIN, ED: Troponin i, poc: 0.01 ng/mL (ref 0.00–0.08)

## 2014-07-14 LAB — COMPREHENSIVE METABOLIC PANEL
ALT: 19 U/L (ref 0–53)
AST: 23 U/L (ref 0–37)
Albumin: 4.6 g/dL (ref 3.5–5.2)
Alkaline Phosphatase: 95 U/L (ref 39–117)
Anion gap: 13 (ref 5–15)
BUN: 12 mg/dL (ref 6–23)
CO2: 26 mEq/L (ref 19–32)
Calcium: 9.6 mg/dL (ref 8.4–10.5)
Chloride: 102 mEq/L (ref 96–112)
Creatinine, Ser: 1.07 mg/dL (ref 0.50–1.35)
GFR calc Af Amer: >90 mL/min (ref >90)
GFR calc non Af Amer: 78 mL/min — ABNORMAL LOW (ref >90)
Glucose, Bld: 127 mg/dL — ABNORMAL HIGH (ref 70–99)
Potassium: 3.9 mEq/L (ref 3.7–5.3)
SODIUM: 141 meq/L (ref 137–147)
TOTAL PROTEIN: 7.7 g/dL (ref 6.0–8.3)
Total Bilirubin: 0.4 mg/dL (ref 0.3–1.2)

## 2014-07-14 LAB — URINALYSIS, ROUTINE W REFLEX MICROSCOPIC
Glucose, UA: NEGATIVE mg/dL
Ketones, ur: NEGATIVE mg/dL
Leukocytes, UA: NEGATIVE
Nitrite: NEGATIVE
Protein, ur: NEGATIVE mg/dL
SPECIFIC GRAVITY, URINE: 1.027 (ref 1.005–1.030)
UROBILINOGEN UA: 1 mg/dL (ref 0.0–1.0)
pH: 6 (ref 5.0–8.0)

## 2014-07-14 LAB — URINE MICROSCOPIC-ADD ON

## 2014-07-14 LAB — CBC WITH DIFFERENTIAL/PLATELET
Basophils Absolute: 0 10*3/uL (ref 0.0–0.1)
Basophils Relative: 0 % (ref 0–1)
Eosinophils Absolute: 0.1 10*3/uL (ref 0.0–0.7)
Eosinophils Relative: 2 % (ref 0–5)
HCT: 40.9 % (ref 39.0–52.0)
Hemoglobin: 14.1 g/dL (ref 13.0–17.0)
Lymphocytes Relative: 38 % (ref 12–46)
Lymphs Abs: 1.9 10*3/uL (ref 0.7–4.0)
MCH: 32.5 pg (ref 26.0–34.0)
MCHC: 34.5 g/dL (ref 30.0–36.0)
MCV: 94.2 fL (ref 78.0–100.0)
Monocytes Absolute: 0.3 10*3/uL (ref 0.1–1.0)
Monocytes Relative: 7 % (ref 3–12)
Neutro Abs: 2.7 10*3/uL (ref 1.7–7.7)
Neutrophils Relative %: 53 % (ref 43–77)
Platelets: 160 10*3/uL (ref 150–400)
RBC: 4.34 MIL/uL (ref 4.22–5.81)
RDW: 13.3 % (ref 11.5–15.5)
WBC: 5 10*3/uL (ref 4.0–10.5)

## 2014-07-14 LAB — LIPASE, BLOOD: Lipase: 29 U/L (ref 11–59)

## 2014-07-14 MED ORDER — IOHEXOL 300 MG/ML  SOLN
25.0000 mL | Freq: Once | INTRAMUSCULAR | Status: AC | PRN
  Administered 2014-07-14: 25 mL via ORAL

## 2014-07-14 MED ORDER — SODIUM CHLORIDE 0.9 % IV SOLN
Freq: Once | INTRAVENOUS | Status: AC
  Administered 2014-07-14: 16:00:00 via INTRAVENOUS

## 2014-07-14 MED ORDER — ONDANSETRON HCL 4 MG/2ML IJ SOLN
4.0000 mg | Freq: Once | INTRAMUSCULAR | Status: AC
  Administered 2014-07-14: 4 mg via INTRAVENOUS
  Filled 2014-07-14: qty 2

## 2014-07-14 MED ORDER — IOHEXOL 300 MG/ML  SOLN
100.0000 mL | Freq: Once | INTRAMUSCULAR | Status: AC | PRN
  Administered 2014-07-14: 100 mL via INTRAVENOUS

## 2014-07-14 MED ORDER — ONDANSETRON 8 MG PO TBDP: ORAL_TABLET | ORAL | Status: DC

## 2014-07-14 MED ORDER — OXYCODONE-ACETAMINOPHEN 5-325 MG PO TABS: 1.0000 | ORAL_TABLET | ORAL | Status: DC | PRN

## 2014-07-14 MED ORDER — HYDROMORPHONE HCL 1 MG/ML IJ SOLN
1.0000 mg | INTRAMUSCULAR | Status: DC | PRN
  Administered 2014-07-14 (×2): 1 mg via INTRAVENOUS
  Filled 2014-07-14 (×2): qty 1

## 2014-07-14 NOTE — Discharge Instructions (Signed)
1. Medications: Zofran, Percocet, usual home medications 2. Treatment: rest, drink plenty of fluids, advance diet slowly 3. Follow Up: Please followup with your primary doctor in 2 days for discussion of your diagnoses and further evaluation after today's visit; if you do not have a primary care doctor use the resource guide provided to find one; Please return to the ER for worsening pain, intractable vomiting, other concerning symptoms, fevers

## 2014-07-14 NOTE — ED Provider Notes (Signed)
CSN: [id number]     Arrival date & time 07/14/14  1532 History   First MD Initiated Contact with Patient 07/14/14 1547     Chief Complaint  Patient presents with  . Abdominal Pain  . Nausea     (Consider location/radiation/quality/duration/timing/severity/associated sxs/prior Treatment) The history is provided by the patient and medical records. No language interpreter was used.    Hector [name] is a 53 y.o. male  with a hx of abnormal LFTs, TBI, anxiety, DDD, NIDDM, gastric bypass Dr. Lucia Gaskins and Dr. Hassell Done (feb 2011),  presents to the Emergency Department complaining of sudden, persistent, progressively worsening epigastric abd pain onset 1 hour ago while eating lunch.  Pt reports pain is burning and the same as previous SBO.  Associated symptoms include nausea without vomiting or diarrhea.  Nothing makes it better and nothing makes it worse.  Pt denies fever, chills, headache, neck pain, chest pain, SOB, V/D, weakness, dizziness, syncope.  Pt reports Hx of perforated ulcer - Dr. Hassell Done (Sept 2011) and SBO requiring surgery - Dr. Lucia Gaskins (May 2014).     Past Medical History  Diagnosis Date  . Hyperlipidemia   . Abnormal LFTs     improved after gastic bypass  . History of concussion   . Gout   . Perforated ulcer     Surgical repair9/24 2011  . B12 nutritional deficiency   . Closed head injury     laceration  9 12  no loc  vision change  . History of renal stone     Seen in emergency room not urologist  . Chest pain     hopsitalized 2 14 evaluation  . OBESITY 12/10/2007    Qualifier: Diagnosis of  By: [name] MD, Standley Brooking Gastric bypass   . 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   [name] GERD (gastroesophageal reflux disease)     no meds  . DDD (degenerative disc disease)   . 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 Sleep apnea     had gastric bypass/ sa-gone  .  OBSTRUCTIVE SLEEP APNEA 07/22/2007    Qualifier: Diagnosis of  By: [name] RN, Doroteo Bradford    . OSA (obstructive sleep apnea) 12/12/2013  . Diabetes mellitus     resolved with gastric bypass  . DIABETES MELLITUS, TYPE II 04/06/2007    Qualifier: Diagnosis of  By: [name] MD, Standley Brooking Improved and off meds since gastric bypass   . History of bunionectomy of left great toe    Past Surgical History  Procedure Laterality Date  . Vasectomy    . Nasal septoplasty w/ turbinoplasty      x 2  . Gastric bypass  11/06/09    beginning weight 267  . Epidural block injection      by Dr. Nelva Bush  . Surgerical repair of stomach ulcer  06/08/10    per  . Hiatal hernia repair N/A 01/20/2013    Procedure: LAPAROSCOPIC REPAIR OF INTERNAL HERNIA;  Surgeon: Shann Medal, MD;  Location: [facility];  Service: General;  Laterality: N/A;  . Back surgery  2012    Dr. Dorina Hoyer disk  . Hernia repair  1761    umbilical   . Tonsillectomy    . Uvulopalatopharyngoplasty  2007    with tonsils  . Shoulder arthroscopy  2014    rt  . Carpal tunnel release  2008    left-ulnar nerve decomp  . Mass excision Right 04/18/2013    Procedure: RIGHT SMALL FINGER SKIN LESION EXCISION;  Surgeon: Jolyn Nap, MD;  Location: [facility];  Service: Orthopedics;  Laterality: Right;  . Carpel tunnel Left   . Lumbar laminectomy/decompression microdiscectomy Left 06/07/2014    Procedure: MICRO LUMBAR DECOMPRESSION L5 - S1 ON THE LEFT/L5 FORAMINOTOMY/EXCISION SYNOVIAL CYST  1 LEVEL;  Surgeon: [name], MD;  Location: [facility];  Service: Orthopedics;  Laterality: Left;   Family History  Problem Relation Age of Onset  . Uterine cancer Mother   . Hypertension Mother   . Cervical cancer Mother   . Heart disease Father   . COPD Father   . Colon cancer Maternal Uncle   . Colon cancer Paternal Aunt    History  Substance Use Topics  . Smoking status: Never Smoker   . Smokeless tobacco: Never Used  . Alcohol Use: Yes      Comment: occas.    Review of Systems  Constitutional: Negative for fever, diaphoresis, appetite change, fatigue and unexpected weight change.  HENT: Negative for mouth sores and trouble swallowing.   Respiratory: Negative for cough, chest tightness, shortness of breath, wheezing and stridor.   Cardiovascular: Negative for chest pain and palpitations.  Gastrointestinal: Positive for nausea and abdominal pain. Negative for vomiting, diarrhea, constipation, blood in stool, abdominal distention and rectal pain.  Genitourinary: Negative for dysuria, urgency, frequency, hematuria, flank pain and difficulty urinating.  Musculoskeletal: Negative for back pain, neck pain and neck stiffness.  Skin: Negative for rash.  Neurological: Negative for weakness.  Hematological: Negative for adenopathy.  Psychiatric/Behavioral: Negative for confusion.  All other systems reviewed and are negative.     Allergies  Aspirin and Hydrocodone  Home Medications   Prior to Admission medications   Medication Sig Start Date End Date Taking? Authorizing Provider  acetaminophen (TYLENOL) 500 MG tablet Take 1,000 mg by mouth once as needed for moderate pain.   Yes Historical Provider, MD  amitriptyline (ELAVIL) 50 MG tablet Take 50 mg by mouth at bedtime as needed for sleep.   Yes Historical Provider, MD  B Complex-C (B-COMPLEX WITH VITAMIN C) tablet Take 1 tablet by mouth daily.   Yes Historical Provider, MD  calcium citrate (CALCITRATE) 950 MG tablet Take 1 tablet by mouth daily.    Yes Historical Provider, MD  Cholecalciferol 10000 UNITS CAPS Take 1 capsule by mouth daily.   Yes Historical Provider, MD  docusate sodium (COLACE) 100 MG capsule Take 1 capsule (100 mg total) by mouth 2 (two) times daily as needed for mild constipation. 06/07/14  Yes [name], MD  escitalopram (LEXAPRO) 20 MG tablet Take 20 mg by mouth every morning.    Yes Historical Provider, MD  indomethacin (INDOCIN) 50 MG capsule Take 50  mg by mouth 3 (three) times daily as needed (pain).   Yes Historical Provider, MD  lamoTRIgine (LAMICTAL) 100 MG tablet Take 100-200 mg by mouth 2 (two) times daily. Take 200mg  qam and 100mg  qhs.   Yes Historical Provider, MD  methocarbamol (ROBAXIN) 500 MG tablet Take 500 mg by mouth every 8 (eight) hours as needed for muscle spasms.   Yes Historical Provider, MD  Multiple Vitamin (MULTIVITAMIN WITH MINERALS) TABS Take 1 tablet by mouth daily.   Yes Historical Provider, MD  ondansetron (ZOFRAN-ODT) 4 MG disintegrating tablet Take 4 mg by mouth every 8 (eight) hours as needed for nausea or vomiting.  Yes Historical Provider, MD  oxyCODONE-acetaminophen (PERCOCET) 7.5-325 MG per tablet Take 1 tablet by mouth every 4 (four) hours as needed for pain. 06/07/14  Yes [name], MD  tamsulosin (FLOMAX) 0.4 MG CAPS Take 0.4 mg by mouth daily after breakfast. 03/02/13  Yes Tiffany Marilu Favre, PA-C  zolpidem (AMBIEN) 10 MG tablet Take 1 tablet (10 mg total) by mouth at bedtime as needed. For sleep 06/24/13  Yes Burnis Medin, MD  ondansetron (ZOFRAN ODT) 8 MG disintegrating tablet 8mg  ODT q4 hours prn nausea 07/14/14   Karman Biswell, PA-C  oxyCODONE-acetaminophen (PERCOCET/ROXICET) 5-325 MG per tablet Take 1-2 tablets by mouth every 4 (four) hours as needed for moderate pain or severe pain. 07/14/14   Pearlie Lafosse, PA-C   BP 122/82  Pulse 74  Temp(Src) 98.8 F (37.1 C) (Oral)  Resp 17  SpO2 96% Physical Exam  Nursing note and vitals reviewed. Constitutional: He appears well-developed and well-nourished. He appears distressed.  Awake, alert, nontoxic appearance Pt uncomfortable appearing in the bed  HENT:  Head: Normocephalic and atraumatic.  Mouth/Throat: Oropharynx is clear and moist. No oropharyngeal exudate.  Eyes: Conjunctivae are normal. No scleral icterus.  Neck: Normal range of motion. Neck supple.  Cardiovascular: Normal rate, regular rhythm, normal heart sounds and intact  distal pulses.   No murmur heard. Pulmonary/Chest: Effort normal and breath sounds normal. No respiratory distress. He has no wheezes.  Equal chest expansion  Abdominal: Soft. Bowel sounds are normal. He exhibits no distension and no mass. There is tenderness in the epigastric area. There is guarding. There is no rebound and no CVA tenderness.  TTP of the epigastric region with guarding.    Musculoskeletal: Normal range of motion. He exhibits no edema and no tenderness.  Neurological: He is alert. He exhibits normal muscle tone. Coordination normal.  Speech is clear and goal oriented Moves extremities without ataxia  Skin: Skin is warm and dry. He is not diaphoretic. No erythema.  Psychiatric: He has a normal mood and affect.    ED Course  Procedures (including critical care time) Labs Review Labs Reviewed  COMPREHENSIVE METABOLIC PANEL - Abnormal; Notable for the following:    Glucose, Bld 127 (*)    GFR calc non Af Amer 78 (*)    All other components within normal limits  URINALYSIS, ROUTINE W REFLEX MICROSCOPIC - Abnormal; Notable for the following:    APPearance CLOUDY (*)    Hgb urine dipstick SMALL (*)    Bilirubin Urine SMALL (*)    All other components within normal limits  URINE MICROSCOPIC-ADD ON - Abnormal; Notable for the following:    Crystals CA OXALATE CRYSTALS (*)    All other components within normal limits  CBC WITH DIFFERENTIAL  LIPASE, BLOOD  I-STAT TROPOININ, ED    Imaging Review Ct Abdomen Pelvis W Contrast  07/14/2014   CLINICAL DATA:  Midline upper abdominal pain.  EXAM: CT ABDOMEN AND PELVIS WITH CONTRAST  TECHNIQUE: Multidetector CT imaging of the abdomen and pelvis was performed using the standard protocol following bolus administration of intravenous contrast.  CONTRAST:  169mL OMNIPAQUE IOHEXOL 300 MG/ML SOLN, 41mL OMNIPAQUE IOHEXOL 300 MG/ML SOLN  COMPARISON:  Abdominal series 07/14/2014.  FINDINGS: Liver normal. Spleen normal. Pancreas normal. No  biliary distention. The gallbladder is nondistended.  Adrenals normal. Right nephrolithiasis. Parapelvic cysts. No obstructing ureteral stone or hydroureter. Bladder is nondistended. Bladder wall thickening noted may be related to lack of distention. Bladder pathology including cystitis could present in  this fashion. Prostate is not enlarged. Small amount of fluid noted in the pelvis.  No adenopathy. Abdominal aorta normal in caliber. Visceral vessels patent. Portal vein is patent.  Appendix normal. No bowel distention. Contrast is in the distal esophagus this suggests possibility of reflux.Postsurgical changes noted of the stomach. Debris noted in the stomach. No mesenteric mass. No hernia.  Heart size normal. Mild base atelectasis and/or scarring. Degenerative changes lumbar spine.  IMPRESSION: 1. Right nephrolithiasis. No evidence of urolithiasis or hydronephrosis. Bilateral renal parapelvic cysts are noted. 2. Postsurgical changes of the stomach noted. Large amount of debris is noted in the stomach. This suggests possibility of delayed emptying. Oral contrast is noted in the distal esophagus. Gastroesophageal reflux could present in this fashion. 3. Mild bladder wall thickening. This may be from lack of distension, active bladder pathology including cystitis cannot be excluded. Small amount of free pelvic fluid is present.   Electronically Signed   By: Marcello Moores  Register   On: 07/14/2014 19:51   Dg Abd Acute W/chest  07/14/2014   CLINICAL DATA:  Abdominal pain x1 day.  Prior gastric bypass.  EXAM: ACUTE ABDOMEN SERIES (ABDOMEN 2 VIEW & CHEST 1 VIEW)  COMPARISON:  Chest x-ray 06/05/2014.  CT 01/20/2014.  FINDINGS: Mediastinum and hilar structures normal. Subsegmental atelectasis versus infiltrate left lung base.  Soft tissue structures of the abdomen are unremarkable. Air-fluid levels are noted over the abdomen. These air-fluid levels are primarily in the colon. And diarrheal illness could present in this  fashion. Adynamic ileus cannot be excluded. Follow-up abdominal series to demonstrate resolution suggests  IMPRESSION: 1. Mild left lower lobe infiltrate consistent with pneumonia. 2. Air fluid levels noted over the abdomen. These most likely are in the colon. A diarrheal illness or adynamic ileus could present in this fashion. Follow-up abdominal series suggested to demonstrate resolution.   Electronically Signed   By: Marcello Moores  Register   On: 07/14/2014 17:05     EKG Interpretation None      MDM   Final diagnoses:  Abdominal pain  Hx SBO  Delayed gastric emptying   Hector [name] presents with sudden onset epigastric abd pain, similar to previous SBO.  Abdomen is very tender in epigastrium without rigidity or rebound.  Concern for possible small bowel obstruction. Will obtain plain films to rule out perforation, labs and CT scan.  5:35PM Acute abdominal series with no evidence of free air but air-fluid levels noted in the abdomen with question of an adynamic ileus.  CT scan pending.  Patient pain controlled and abdomen is soft but somewhat tender.  7:51PM CT scan with large amount of debris in the stomach, likely delayed gastric emptying.  No comment on small bowel therefore study was discussed with Dr. Register, of radiology, who reports there is no evidence of a small bowel obstruction or ileus on CT scan.  Repeat exam abdomen remained soft and mildly tender in the epigastrium the patient reports he feels well enough to go home. Recommend the patient follow-up with discussed her neurologist on Monday morning for gastric emptying study. He's returned emergency department for worsening symptoms, fevers or intractable vomiting.  Recommend that he advance his diet slowly. Emesis while here in the emergency department.  I have personally reviewed patient's vitals, nursing note and any pertinent labs or imaging.  I performed an undressed physical exam.    It has been determined that no acute  conditions requiring further emergency intervention are present at this time. The patient/guardian have been  advised of the diagnosis and plan. I reviewed all labs and imaging including any potential incidental findings. We have discussed signs and symptoms that warrant return to the ED and they are listed in the discharge instructions.    Vital signs are stable at discharge.   BP 122/82  Pulse 74  Temp(Src) 98.8 F (37.1 C) (Oral)  Resp 17  SpO2 96%        Abigail Butts, PA-C 07/15/14 [phone]

## 2014-07-14 NOTE — ED Notes (Signed)
Patient transported to X-ray 

## 2014-07-14 NOTE — ED Notes (Signed)
Pt states that while he was eating lunch he started having severe mid line upper abd pain.  Pt states that he is gastric bypass pt, had bowel obstruction and perforated ulcer.

## 2014-07-15 NOTE — ED Provider Notes (Signed)
Medical screening examination/treatment/procedure(s) were performed by non-physician practitioner and as supervising physician I was immediately available for consultation/collaboration.   EKG Interpretation None        Ernestina Patches, MD 07/15/14 1336

## 2014-09-01 ENCOUNTER — Other Ambulatory Visit: Payer: Self-pay | Admitting: Family Medicine

## 2014-09-01 NOTE — Telephone Encounter (Signed)
Sent to the pharmacy by e-scribe. 

## 2014-10-02 ENCOUNTER — Other Ambulatory Visit: Payer: Self-pay | Admitting: Internal Medicine

## 2014-10-03 NOTE — Telephone Encounter (Signed)
Ok x 90 days  

## 2014-10-04 NOTE — Telephone Encounter (Signed)
Sent to the pharmacy by e-scribe. 

## 2014-10-10 ENCOUNTER — Other Ambulatory Visit: Payer: Self-pay | Admitting: Urology

## 2014-10-10 ENCOUNTER — Other Ambulatory Visit (HOSPITAL_COMMUNITY): Payer: Self-pay | Admitting: Urology

## 2014-10-10 DIAGNOSIS — N200 Calculus of kidney: Secondary | ICD-10-CM

## 2014-10-16 ENCOUNTER — Encounter: Payer: Self-pay | Admitting: Internal Medicine

## 2014-10-16 ENCOUNTER — Ambulatory Visit (INDEPENDENT_AMBULATORY_CARE_PROVIDER_SITE_OTHER): Payer: Federal, State, Local not specified - PPO | Admitting: Internal Medicine

## 2014-10-16 VITALS — BP 130/80 | Temp 98.3°F | Ht 70.0 in | Wt 202.9 lb

## 2014-10-16 DIAGNOSIS — E119 Type 2 diabetes mellitus without complications: Secondary | ICD-10-CM

## 2014-10-16 DIAGNOSIS — Z79899 Other long term (current) drug therapy: Secondary | ICD-10-CM

## 2014-10-16 DIAGNOSIS — R030 Elevated blood-pressure reading, without diagnosis of hypertension: Secondary | ICD-10-CM

## 2014-10-16 DIAGNOSIS — Z87442 Personal history of urinary calculi: Secondary | ICD-10-CM

## 2014-10-16 LAB — BASIC METABOLIC PANEL
BUN: 12 mg/dL (ref 6–23)
CO2: 30 mEq/L (ref 19–32)
CREATININE: 1.18 mg/dL (ref 0.40–1.50)
Calcium: 9.3 mg/dL (ref 8.4–10.5)
Chloride: 103 mEq/L (ref 96–112)
GFR: 68.61 mL/min (ref >60.00)
Glucose, Bld: 116 mg/dL — ABNORMAL HIGH (ref 70–99)
Potassium: 3.8 mEq/L (ref 3.5–5.1)
Sodium: 140 mEq/L (ref 135–145)

## 2014-10-16 LAB — HEMOGLOBIN A1C: Hgb A1c MFr Bld: 6 % (ref 4.6–6.5)

## 2014-10-16 NOTE — Progress Notes (Signed)
Pre visit review using our clinic review tool, if applicable. No additional management support is needed unless otherwise documented below in the visit note.  Chief Complaint  Patient presents with  . Hypertension    HPI: Hector [name] 54 y.o. 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  Here to assess bp issues  Dr Lucrezia Europe  High BP  X 2 in her office  And here to check 'since then had wrist monitor not high .  Got Arm  Monitor and mostly in range  This am had  136/101  This  Average 125/80 118/76   Feels fine cv pulm wise .  lbp off and on stone etc . Rare use if nsaids   ocass indocin infrequent . ROS: See pertinent positives and negatives per HPI.  Past Medical History  Diagnosis Date  . Hyperlipidemia   . Abnormal LFTs     improved after gastic bypass  . History of concussion   . Gout   . Perforated ulcer     Surgical repair9/24 2011  . B12 nutritional deficiency   . Closed head injury     laceration  9 12  no loc  vision change  . History of renal stone     Seen in emergency room not urologist  . Chest pain     hopsitalized 2 14 evaluation  . OBESITY 12/10/2007    Qualifier: Diagnosis of  By: [name] MD, Standley Brooking Gastric bypass   . 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   [name] GERD (gastroesophageal reflux disease)     no meds  . DDD (degenerative disc disease)   . 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 Sleep apnea     had gastric bypass/ sa-gone  . OBSTRUCTIVE SLEEP APNEA 07/22/2007    Qualifier: Diagnosis of  By: [name] RN, Doroteo Bradford    . OSA (obstructive sleep apnea) 12/12/2013  . Diabetes mellitus     resolved with gastric bypass  . DIABETES MELLITUS, TYPE II 04/06/2007    Qualifier: Diagnosis of  By: [name] MD, Standley Brooking Improved  and off meds since gastric bypass   . History of bunionectomy of left great toe     Family History  Problem Relation Age of Onset  . Uterine cancer Mother   . Hypertension Mother   . Cervical cancer Mother   . Heart disease Father   . COPD Father   . Colon cancer Maternal Uncle   . Colon cancer Paternal Aunt     History   Social History  . Marital Status: Married    Spouse Name: Janine    Number of Children: 2  . Years of Education: Associates   Occupational History  . RADAR TECH    Social History Main Topics  . Smoking status: Never Smoker   . Smokeless tobacco: Never Used  . Alcohol Use: Yes     Comment: occas.  . Drug Use: No  . Sexual Activity: Yes   Other Topics Concern  . 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   Patient has two children.   Patient has a college education.   Regular exercise-no  Employed Event organiser and investigation   Now in school travels       10 hours work     Sleep  No longer needs  cpap   Patient is right-handed.   Patient drinks one soda daily.          Outpatient Encounter Prescriptions as of 10/16/2014  Medication Sig  . acetaminophen (TYLENOL) 500 MG tablet Take 1,000 mg by mouth once as needed for moderate pain.  [name] amitriptyline (ELAVIL) 50 MG tablet Take 50 mg by mouth at bedtime as needed for sleep.  [name] amitriptyline (ELAVIL) 50 MG tablet TAKE 1 TABLET AS NEEDED FOR SLEEP  . B Complex-C (B-COMPLEX WITH VITAMIN C) tablet Take 1 tablet by mouth daily.  . calcium citrate (CALCITRATE) 950 MG tablet Take 1 tablet by mouth daily.   . Cholecalciferol 10000 UNITS CAPS Take 1 capsule by mouth daily.  [name] docusate sodium (COLACE) 100 MG capsule Take 1 capsule (100 mg total) by mouth 2 (two) times daily as needed for mild constipation.  [name] escitalopram (LEXAPRO) 20 MG tablet Take 20 mg by mouth every morning.   . indomethacin (INDOCIN) 50 MG capsule Take 50 mg by mouth 3 (three)  times daily as needed (pain).  . indomethacin (INDOCIN) 50 MG capsule TAKE 1 CAPSULE BY MOUTH 3 TIMES A DAY AS NEEDED  . lamoTRIgine (LAMICTAL) 100 MG tablet Take 100-200 mg by mouth 2 (two) times daily. Take 200mg  qam and 100mg  qhs.  . methocarbamol (ROBAXIN) 500 MG tablet Take 500 mg by mouth every 8 (eight) hours as needed for muscle spasms.  . Multiple Vitamin (MULTIVITAMIN WITH MINERALS) TABS Take 1 tablet by mouth daily.  . ondansetron (ZOFRAN ODT) 8 MG disintegrating tablet 8mg  ODT q4 hours prn nausea  . ondansetron (ZOFRAN-ODT) 4 MG disintegrating tablet Take 4 mg by mouth every 8 (eight) hours as needed for nausea or vomiting.  [name] oxyCODONE-acetaminophen (PERCOCET/ROXICET) 5-325 MG per tablet Take 1-2 tablets by mouth every 4 (four) hours as needed for moderate pain or severe pain.  . tamsulosin (FLOMAX) 0.4 MG CAPS Take 0.4 mg by mouth daily after breakfast.  . zolpidem (AMBIEN) 10 MG tablet Take 1 tablet (10 mg total) by mouth at bedtime as needed. For sleep  . [DISCONTINUED] oxyCODONE-acetaminophen (PERCOCET) 7.5-325 MG per tablet Take 1 tablet by mouth every 4 (four) hours as needed for pain.    EXAM:  BP 130/80 mmHg  Temp(Src) 98.3 F (36.8 C) (Oral)  Ht 5\' 10"  (1.778 m)  Wt 202 lb 14.4 oz (92.035 kg)  BMI 29.11 kg/m2  Body mass index is 29.11 kg/(m^2).  GENERAL: vitals reviewed and listed above, alert, oriented, appears well hydrated and in no acute distress mild hoarseness no cough HEENT: atraumatic, conjunctiva  clear, no obvious abnormalities on inspection of external nose and earsNECK: no obvious masses on inspection palpation  LUNGS: clear to auscultation bilaterally, no wheezes, rales or rhonchi, good air movement CV: HRRR, no clubbing cyanosis or  peripheral edema nl cap refill  abd soft no masses well healed scars  MS: moves all extremities without noticeable focal  abnormality PSYCH: pleasant and cooperative, no obvious depression or anxiety Lab Results    Component Value Date   WBC 5.0 07/14/2014   HGB 14.1 07/14/2014   HCT 40.9 07/14/2014   PLT 160 07/14/2014   GLUCOSE 127* 07/14/2014   CHOL 182 05/15/2014   TRIG 162.0* 05/15/2014   HDL 41.90 05/15/2014   LDLDIRECT 113.2 04/01/2007   [place]  108* 05/15/2014   ALT 19 07/14/2014   AST 23 07/14/2014   NA 141 07/14/2014   K 3.9 07/14/2014   CL 102 07/14/2014   CREATININE 1.07 07/14/2014   BUN 12 07/14/2014   CO2 26 07/14/2014   TSH 1.10 07/11/2013   PSA 1.01 08/30/2012   INR 0.97 10/19/2012   HGBA1C 6.4 05/15/2014   MICROALBUR 0.6 05/15/2014   BP Readings from Last 3 Encounters:  10/16/14 130/80  07/14/14 122/82  06/08/14 109/65   Wt Readings from Last 3 Encounters:  10/16/14 202 lb 14.4 oz (92.035 kg)  06/07/14 199 lb (90.266 kg)  05/15/14 203 lb (92.08 kg)    ASSESSMENT AND PLAN:  Discussed the following assessment and plan:  Elevated blood pressure reading - nl now  low threshold to begin arb or ace ( hx of gout may use losartan if needed) - Plan: Basic metabolic panel, Hemoglobin A1c  Diabetes mellitus without complication - in remission since bypass and lsi - Plan: Basic metabolic panel, Hemoglobin A1c  History of renal stone - Plan: Basic metabolic panel, Hemoglobin A1c  Medication management - Plan: Basic metabolic panel, Hemoglobin A1c -Patient advised to return or notify health care team  if symptoms worsen ,persist or new concerns arise.  Patient Instructions  Take blood pressure readings twice a day for 10 -14 days and then periodically .To ensure below 140/90  Preferred 130/85   .Send in readings      Send in an average  If any concerns about levels we can add .  Medication such as lisinopril or  Losartan .  Send in readings my chart .  And we can add medication.   Will notify you  of labs when available. I f ok then plan wellness visit   In about 6 months     DASH Eating Plan DASH stands for "Dietary Approaches to Stop Hypertension." The DASH  eating plan is a healthy eating plan that has been shown to reduce high blood pressure (hypertension). Additional health benefits may include reducing the risk of type 2 diabetes mellitus, heart disease, and stroke. The DASH eating plan may also help with weight loss. WHAT DO I NEED TO KNOW ABOUT THE DASH EATING PLAN? For the DASH eating plan, you will follow these general guidelines:  Choose foods with a percent daily value for sodium of less than 5% (as listed on the food label).  Use salt-free seasonings or herbs instead of table salt or sea salt.  Check with your health care provider or pharmacist before using salt substitutes.  Eat lower-sodium products, often labeled as "lower sodium" or "no salt added."  Eat fresh foods.  Eat more vegetables, fruits, and low-fat dairy products.  Choose whole grains. Look for the word "whole" as the first word in the ingredient list.  Choose fish and skinless chicken or [place] more often than red meat. Limit fish, poultry, and meat to 6 oz (170 g) each day.  Limit sweets, desserts, sugars, and sugary drinks.  Choose heart-healthy fats.  Limit cheese to 1 oz (28 g) per day.  Eat more home-cooked food and less restaurant, buffet, and fast food.  Limit fried foods.  Cook foods using methods other than frying.  Limit canned vegetables. If you do use them, rinse them well to decrease the sodium.  When eating at a restaurant, ask that your food be prepared with less salt, or no salt if possible. WHAT FOODS CAN I EAT? Seek help from a dietitian  for individual calorie needs. Grains Whole grain or whole wheat bread. Brown rice. Whole grain or whole wheat pasta. Quinoa, bulgur, and whole grain cereals. Low-sodium cereals. Corn or whole wheat flour tortillas. Whole grain cornbread. Whole grain crackers. Low-sodium crackers. Vegetables Fresh or frozen vegetables (raw, steamed, roasted, or grilled). Low-sodium or reduced-sodium tomato and vegetable  juices. Low-sodium or reduced-sodium tomato sauce and paste. Low-sodium or reduced-sodium canned vegetables.  Fruits All fresh, canned (in natural juice), or frozen fruits. Meat and Other Protein Products Ground beef (85% or leaner), grass-fed beef, or beef trimmed of fat. Skinless chicken or [place]. Ground chicken or [place]. Pork trimmed of fat. All fish and seafood. Eggs. Dried beans, peas, or lentils. Unsalted nuts and seeds. Unsalted canned beans. Dairy Low-fat dairy products, such as skim or 1% milk, 2% or reduced-fat cheeses, low-fat ricotta or cottage cheese, or plain low-fat yogurt. Low-sodium or reduced-sodium cheeses. Fats and Oils Tub margarines without trans fats. Light or reduced-fat mayonnaise and salad dressings (reduced sodium). Avocado. Safflower, olive, or canola oils. Natural peanut or almond butter. Other Unsalted popcorn and pretzels. The items listed above may not be a complete list of recommended foods or beverages. Contact your dietitian for more options. WHAT FOODS ARE NOT RECOMMENDED? Grains White bread. White pasta. White rice. Refined cornbread. Bagels and croissants. Crackers that contain trans fat. Vegetables Creamed or fried vegetables. Vegetables in a cheese sauce. Regular canned vegetables. Regular canned tomato sauce and paste. Regular tomato and vegetable juices. Fruits Dried fruits. Canned fruit in light or heavy syrup. Fruit juice. Meat and Other Protein Products Fatty cuts of meat. Ribs, chicken wings, bacon, sausage, bologna, salami, chitterlings, fatback, hot dogs, bratwurst, and packaged luncheon meats. Salted nuts and seeds. Canned beans with salt. Dairy Whole or 2% milk, cream, half-and-half, and cream cheese. Whole-fat or sweetened yogurt. Full-fat cheeses or blue cheese. Nondairy creamers and whipped toppings. Processed cheese, cheese spreads, or cheese curds. Condiments Onion and garlic salt, seasoned salt, table salt, and sea salt. Canned and  packaged gravies. Worcestershire sauce. Tartar sauce. Barbecue sauce. Teriyaki sauce. Soy sauce, including reduced sodium. Steak sauce. Fish sauce. Oyster sauce. Cocktail sauce. Horseradish. Ketchup and mustard. Meat flavorings and tenderizers. Bouillon cubes. Hot sauce. Tabasco sauce. Marinades. Taco seasonings. Relishes. Fats and Oils Butter, stick margarine, lard, shortening, ghee, and bacon fat. Coconut, palm kernel, or palm oils. Regular salad dressings. Other Pickles and olives. Salted popcorn and pretzels. The items listed above may not be a complete list of foods and beverages to avoid. Contact your dietitian for more information. WHERE CAN I FIND MORE INFORMATION? National Heart, Lung, and Blood Institute: travelstabloid.com Document Released: 08/21/2011 Document Revised: 01/16/2014 Document Reviewed: 07/06/2013 [facility] Patient Information 2015 [place], [place]. This information is not intended to replace advice given to you by your health care provider. Make sure you discuss any questions you have with your health care provider.       Standley Brooking. Nakiah Osgood M.D.

## 2014-10-16 NOTE — Patient Instructions (Addendum)
Take blood pressure readings twice a day for 10 -14 days and then periodically .To ensure below 140/90  Preferred 130/85   .Send in readings      Send in an average  If any concerns about levels we can add .  Medication such as lisinopril or  Losartan .  Send in readings my chart .  And we can add medication.   Will notify you  of labs when available. I f ok then plan wellness visit   In about 6 months     DASH Eating Plan DASH stands for "Dietary Approaches to Stop Hypertension." The DASH eating plan is a healthy eating plan that has been shown to reduce high blood pressure (hypertension). Additional health benefits may include reducing the risk of type 2 diabetes mellitus, heart disease, and stroke. The DASH eating plan may also help with weight loss. WHAT DO I NEED TO KNOW ABOUT THE DASH EATING PLAN? For the DASH eating plan, you will follow these general guidelines:  Choose foods with a percent daily value for sodium of less than 5% (as listed on the food label).  Use salt-free seasonings or herbs instead of table salt or sea salt.  Check with your health care provider or pharmacist before using salt substitutes.  Eat lower-sodium products, often labeled as "lower sodium" or "no salt added."  Eat fresh foods.  Eat more vegetables, fruits, and low-fat dairy products.  Choose whole grains. Look for the word "whole" as the first word in the ingredient list.  Choose fish and skinless chicken or [place] more often than red meat. Limit fish, poultry, and meat to 6 oz (170 g) each day.  Limit sweets, desserts, sugars, and sugary drinks.  Choose heart-healthy fats.  Limit cheese to 1 oz (28 g) per day.  Eat more home-cooked food and less restaurant, buffet, and fast food.  Limit fried foods.  Cook foods using methods other than frying.  Limit canned vegetables. If you do use them, rinse them well to decrease the sodium.  When eating at a restaurant, ask that your food be  prepared with less salt, or no salt if possible. WHAT FOODS CAN I EAT? Seek help from a dietitian for individual calorie needs. Grains Whole grain or whole wheat bread. Brown rice. Whole grain or whole wheat pasta. Quinoa, bulgur, and whole grain cereals. Low-sodium cereals. Corn or whole wheat flour tortillas. Whole grain cornbread. Whole grain crackers. Low-sodium crackers. Vegetables Fresh or frozen vegetables (raw, steamed, roasted, or grilled). Low-sodium or reduced-sodium tomato and vegetable juices. Low-sodium or reduced-sodium tomato sauce and paste. Low-sodium or reduced-sodium canned vegetables.  Fruits All fresh, canned (in natural juice), or frozen fruits. Meat and Other Protein Products Ground beef (85% or leaner), grass-fed beef, or beef trimmed of fat. Skinless chicken or [place]. Ground chicken or [place]. Pork trimmed of fat. All fish and seafood. Eggs. Dried beans, peas, or lentils. Unsalted nuts and seeds. Unsalted canned beans. Dairy Low-fat dairy products, such as skim or 1% milk, 2% or reduced-fat cheeses, low-fat ricotta or cottage cheese, or plain low-fat yogurt. Low-sodium or reduced-sodium cheeses. Fats and Oils Tub margarines without trans fats. Light or reduced-fat mayonnaise and salad dressings (reduced sodium). Avocado. Safflower, olive, or canola oils. Natural peanut or almond butter. Other Unsalted popcorn and pretzels. The items listed above may not be a complete list of recommended foods or beverages. Contact your dietitian for more options. WHAT FOODS ARE NOT RECOMMENDED? Grains White bread. White pasta. White rice.  Refined cornbread. Bagels and croissants. Crackers that contain trans fat. Vegetables Creamed or fried vegetables. Vegetables in a cheese sauce. Regular canned vegetables. Regular canned tomato sauce and paste. Regular tomato and vegetable juices. Fruits Dried fruits. Canned fruit in light or heavy syrup. Fruit juice. Meat and Other Protein  Products Fatty cuts of meat. Ribs, chicken wings, bacon, sausage, bologna, salami, chitterlings, fatback, hot dogs, bratwurst, and packaged luncheon meats. Salted nuts and seeds. Canned beans with salt. Dairy Whole or 2% milk, cream, half-and-half, and cream cheese. Whole-fat or sweetened yogurt. Full-fat cheeses or blue cheese. Nondairy creamers and whipped toppings. Processed cheese, cheese spreads, or cheese curds. Condiments Onion and garlic salt, seasoned salt, table salt, and sea salt. Canned and packaged gravies. Worcestershire sauce. Tartar sauce. Barbecue sauce. Teriyaki sauce. Soy sauce, including reduced sodium. Steak sauce. Fish sauce. Oyster sauce. Cocktail sauce. Horseradish. Ketchup and mustard. Meat flavorings and tenderizers. Bouillon cubes. Hot sauce. Tabasco sauce. Marinades. Taco seasonings. Relishes. Fats and Oils Butter, stick margarine, lard, shortening, ghee, and bacon fat. Coconut, palm kernel, or palm oils. Regular salad dressings. Other Pickles and olives. Salted popcorn and pretzels. The items listed above may not be a complete list of foods and beverages to avoid. Contact your dietitian for more information. WHERE CAN I FIND MORE INFORMATION? National Heart, Lung, and Blood Institute: travelstabloid.com Document Released: 08/21/2011 Document Revised: 01/16/2014 Document Reviewed: 07/06/2013 [employer] Patient Information 2015 [place], [place]. This information is not intended to replace advice given to you by your health care provider. Make sure you discuss any questions you have with your health care provider.

## 2014-10-26 NOTE — Patient Instructions (Signed)
Hector [name]  10/26/2014   Your procedure is scheduled on: 11/03/2014    Report to [facility] Main  Entrance and follow signs to               RADIOLOGY  At    [facility],  THEN THEY WILL TAKE YOU TO SHORT STAY WHERE THE NURSE WILL GET YOU READY.    Call this number if you have problems the morning of surgery [phone]   Remember:  Do not eat food or drink liquids :After Midnight.     Take these medicines the morning of surgery with A SIP OF WATER:                                You may not have any metal on your body including hair pins and              piercings  Do not wear jewelry, , lotions, powders or perfumes., DEODORANT.                            Men may shave face and neck.   Do not bring valuables to the hospital. [place].  Contacts, dentures or bridgework may not be worn into surgery.  Leave suitcase in the car. After surgery it may be brought to your room.       Special Instructions:coughing and deep breathing exercises, leg exercises               Please read over the following fact sheets you were given: _____________________________________________________________________             [facility] - Preparing for Surgery Before surgery, you can play an important role.  Because skin is not sterile, your skin needs to be as free of germs as possible.  You can reduce the number of germs on your skin by washing with CHG (chlorahexidine gluconate) soap before surgery.  CHG is an antiseptic cleaner which kills germs and bonds with the skin to continue killing germs even after washing. Please DO NOT use if you have an allergy to CHG or antibacterial soaps.  If your skin becomes reddened/irritated stop using the CHG and inform your nurse when you arrive at Short Stay. Do not shave (including legs and underarms) for at least 48 hours prior to the first CHG shower.  You may shave your  face/neck. Please follow these instructions carefully:  1.  Shower with CHG Soap the night before surgery and the  morning of Surgery.  2.  If you choose to wash your hair, wash your hair first as usual with your  normal  shampoo.  3.  After you shampoo, rinse your hair and body thoroughly to remove the  shampoo.                           4.  Use CHG as you would any other liquid soap.  You can apply chg directly  to the skin and wash                       Gently with a  scrungie or clean washcloth.  5.  Apply the CHG Soap to your body ONLY FROM THE NECK DOWN.   Do not use on face/ open                           Wound or open sores. Avoid contact with eyes, ears mouth and genitals (private parts).                       Wash face,  Genitals (private parts) with your normal soap.             6.  Wash thoroughly, paying special attention to the area where your surgery  will be performed.  7.  Thoroughly rinse your body with warm water from the neck down.  8.  DO NOT shower/wash with your normal soap after using and rinsing off  the CHG Soap.                9.  Pat yourself dry with a clean towel.            10.  Wear clean pajamas.            11.  Place clean sheets on your bed the night of your first shower and do not  sleep with pets. Day of Surgery : Do not apply any lotions/deodorants the morning of surgery.  Please wear clean clothes to the hospital/surgery center.  FAILURE TO FOLLOW THESE INSTRUCTIONS MAY RESULT IN THE CANCELLATION OF YOUR SURGERY PATIENT SIGNATURE_________________________________  NURSE SIGNATURE__________________________________  ________________________________________________________________________

## 2014-10-27 ENCOUNTER — Encounter (HOSPITAL_COMMUNITY): Payer: Self-pay

## 2014-10-27 ENCOUNTER — Encounter (HOSPITAL_COMMUNITY)
Admission: RE | Admit: 2014-10-27 | Discharge: 2014-10-27 | Disposition: A | Discharge status: Home / Self Care | Payer: Federal, State, Local not specified - PPO | Source: Ambulatory Visit | Attending: Urology | Admitting: Urology

## 2014-10-27 ENCOUNTER — Ambulatory Visit (HOSPITAL_COMMUNITY)
Admission: RE | Admit: 2014-10-27 | Discharge: 2014-10-27 | Disposition: A | Discharge status: Home / Self Care | Payer: Federal, State, Local not specified - PPO | Source: Ambulatory Visit | Attending: Anesthesiology | Admitting: Anesthesiology

## 2014-10-27 DIAGNOSIS — E119 Type 2 diabetes mellitus without complications: Secondary | ICD-10-CM | POA: Diagnosis not present

## 2014-10-27 DIAGNOSIS — E785 Hyperlipidemia, unspecified: Secondary | ICD-10-CM | POA: Insufficient documentation

## 2014-10-27 DIAGNOSIS — R938 Abnormal findings on diagnostic imaging of other specified body structures: Secondary | ICD-10-CM | POA: Diagnosis not present

## 2014-10-27 DIAGNOSIS — R079 Chest pain, unspecified: Secondary | ICD-10-CM | POA: Insufficient documentation

## 2014-10-27 DIAGNOSIS — R9389 Abnormal findings on diagnostic imaging of other specified body structures: Secondary | ICD-10-CM

## 2014-10-27 DIAGNOSIS — Z01818 Encounter for other preprocedural examination: Secondary | ICD-10-CM | POA: Diagnosis present

## 2014-10-27 HISTORY — DX: Headache, unspecified: R51.9

## 2014-10-27 HISTORY — DX: Anxiety disorder, unspecified: F41.9

## 2014-10-27 HISTORY — DX: Headache: R51

## 2014-10-27 LAB — CBC
HCT: 50 % (ref 39.0–52.0)
HEMOGLOBIN: 16.6 g/dL (ref 13.0–17.0)
MCH: 32.5 pg (ref 26.0–34.0)
MCHC: 33.2 g/dL (ref 30.0–36.0)
MCV: 97.8 fL (ref 78.0–100.0)
Platelets: 221 10*3/uL (ref 150–400)
RBC: 5.11 MIL/uL (ref 4.22–5.81)
RDW: 13.2 % (ref 11.5–15.5)
WBC: 6.4 10*3/uL (ref 4.0–10.5)

## 2014-10-27 LAB — SURGICAL PCR SCREEN
MRSA, PCR: NEGATIVE
Staphylococcus aureus: POSITIVE — AB

## 2014-10-27 NOTE — Patient Instructions (Signed)
[place]  10/27/2014   Your procedure is scheduled on:     Friday November 03, 2014  Report to [facility] Main Entrance. Will need to check in with Radiology arrive at [place] AM.   Call this number if you have problems the morning of surgery [phone] or Presurgical Testing [phone].   Remember:  Do not eat food or drink liquids :After Midnight.      Take these medicines the morning of surgery with A SIP OF WATER: Escitalopram (Lexapro);Lamotrigine (Lamictal);Oxycodone-Acetaminophen if needed; Tamsulosin (Flomax)                                You may not have any metal on your body including hair pins and piercings  Do not wear jewelry,colognes, lotions, powders, or deodorant.  Men may shave face and neck.               Do not bring valuables to the hospital. [place].  Contacts, dentures or bridgework may not be worn into surgery.  Leave suitcase in the car. After surgery it may be brought to your room.  For patients admitted to the hospital, checkout time is 11:00 AM the day of discharge.     ________________________________________________________________________  [facility] - Preparing for Surgery Before surgery, you can play an important role.  Because skin is not sterile, your skin needs to be as free of germs as possible.  You can reduce the number of germs on your skin by washing with CHG (chlorahexidine gluconate) soap before surgery.  CHG is an antiseptic cleaner which kills germs and bonds with the skin to continue killing germs even after washing. Please DO NOT use if you have an allergy to CHG or antibacterial soaps.  If your skin becomes reddened/irritated stop using the CHG and inform your nurse when you arrive at Short Stay. Do not shave (including legs and underarms) for at least 48 hours prior to the first CHG shower.  You may shave your face/neck. Please follow these instructions carefully:  1.  Shower with  CHG Soap the night before surgery and the  morning of Surgery.  2.  If you choose to wash your hair, wash your hair first as usual with your  normal  shampoo.  3.  After you shampoo, rinse your hair and body thoroughly to remove the  shampoo.                           4.  Use CHG as you would any other liquid soap.  You can apply chg directly  to the skin and wash                       Gently with a scrungie or clean washcloth.  5.  Apply the CHG Soap to your body ONLY FROM THE NECK DOWN.   Do not use on face/ open                           Wound or open sores. Avoid contact with eyes, ears mouth and genitals (private parts).                       Wash face,  Genitals (private parts) with your normal soap.  6.  Wash thoroughly, paying special attention to the area where your surgery  will be performed.  7.  Thoroughly rinse your body with warm water from the neck down.  8.  DO NOT shower/wash with your normal soap after using and rinsing off  the CHG Soap.                9.  Pat yourself dry with a clean towel.            10.  Wear clean pajamas.            11.  Place clean sheets on your bed the night of your first shower and do not  sleep with pets. Day of Surgery : Do not apply any lotions/deodorants the morning of surgery.  Please wear clean clothes to the hospital/surgery center.  FAILURE TO FOLLOW THESE INSTRUCTIONS MAY RESULT IN THE CANCELLATION OF YOUR SURGERY PATIENT SIGNATURE_________________________________  NURSE SIGNATURE__________________________________  ________________________________________________________________________

## 2014-10-27 NOTE — Progress Notes (Addendum)
EKG per epic 07/16/2014 ECHO per epic 10/20/2012 Stress Test 10/20/2012 per epic  Sleep Study 02/08/2014 per epic  DG abd acute with chest epic 07/14/2014 abnormal  Pt positive for sleep apnea currently does not use CPAP BMP epic 10/16/2014

## 2014-10-27 NOTE — Progress Notes (Addendum)
Surgical screening per PAT visit 10/27/2014 positive for Staph prescription for Mupriocin called into [facility] [place] spoke with [place]; Pt notified. Received call late afternoon 2/08/17/2015 stating he was told per pharmacy they did not have the prescription. He stated he had some of the Mupriocin ointment from Sept 2015 when he was positive for Staph. This nurse instructed pt she was not able to instruct pt to use the past ointment and would recontact pharmacy to see issues with prescription. This nurse instructed pt that if he was considering using the past prescription he would need to contact his surgeon to see if they are in agreement.

## 2014-10-30 ENCOUNTER — Inpatient Hospital Stay (HOSPITAL_COMMUNITY)
Admission: RE | Admit: 2014-10-30 | Discharge: 2014-10-30 | Disposition: A | Discharge status: Home / Self Care | Payer: Federal, State, Local not specified - PPO | Source: Ambulatory Visit

## 2014-10-30 NOTE — Progress Notes (Signed)
Surgical screening results per PAT visit 10/27/2014 positive for Staph. Results sent to Dr Karsten Ro.  recontacted [facility] per [employer]. Spoke with Ebony Hail. Verify prescription called in on 10/27/2014. Ebony Hail stated prescription is currently on hold. She was unsure as to why but would make sure was available for pickup. Pharmacy notifies pt of available prescription.

## 2014-10-31 ENCOUNTER — Other Ambulatory Visit: Payer: Self-pay | Admitting: Radiology

## 2014-11-02 ENCOUNTER — Other Ambulatory Visit: Payer: Self-pay | Admitting: Radiology

## 2014-11-03 ENCOUNTER — Encounter (HOSPITAL_COMMUNITY)
Admission: RE | Disposition: A | Discharge status: Home / Self Care | Payer: Self-pay | Source: Ambulatory Visit | Attending: Urology

## 2014-11-03 ENCOUNTER — Ambulatory Visit (HOSPITAL_COMMUNITY): Payer: Federal, State, Local not specified - PPO | Admitting: Certified Registered Nurse Anesthetist

## 2014-11-03 ENCOUNTER — Encounter (HOSPITAL_COMMUNITY): Payer: Self-pay | Admitting: *Deleted

## 2014-11-03 ENCOUNTER — Encounter (HOSPITAL_COMMUNITY): Payer: Self-pay

## 2014-11-03 ENCOUNTER — Ambulatory Visit (HOSPITAL_COMMUNITY)
Admission: RE | Admit: 2014-11-03 | Discharge: 2014-11-03 | Disposition: A | Discharge status: Home / Self Care | Payer: Federal, State, Local not specified - PPO | Source: Ambulatory Visit | Attending: Urology | Admitting: Urology

## 2014-11-03 ENCOUNTER — Encounter (HOSPITAL_COMMUNITY): Payer: Self-pay | Admitting: Certified Registered Nurse Anesthetist

## 2014-11-03 ENCOUNTER — Ambulatory Visit (HOSPITAL_COMMUNITY)
Admission: RE | Admit: 2014-11-03 | Discharge: 2014-11-05 | Disposition: A | Discharge status: Home / Self Care | Payer: Federal, State, Local not specified - PPO | Source: Ambulatory Visit | Attending: Urology | Admitting: Urology

## 2014-11-03 ENCOUNTER — Ambulatory Visit (HOSPITAL_COMMUNITY): Payer: Federal, State, Local not specified - PPO

## 2014-11-03 DIAGNOSIS — Z79891 Long term (current) use of opiate analgesic: Secondary | ICD-10-CM | POA: Insufficient documentation

## 2014-11-03 DIAGNOSIS — E118 Type 2 diabetes mellitus with unspecified complications: Secondary | ICD-10-CM | POA: Diagnosis not present

## 2014-11-03 DIAGNOSIS — Z79899 Other long term (current) drug therapy: Secondary | ICD-10-CM | POA: Insufficient documentation

## 2014-11-03 DIAGNOSIS — E785 Hyperlipidemia, unspecified: Secondary | ICD-10-CM | POA: Insufficient documentation

## 2014-11-03 DIAGNOSIS — N200 Calculus of kidney: Secondary | ICD-10-CM

## 2014-11-03 DIAGNOSIS — K219 Gastro-esophageal reflux disease without esophagitis: Secondary | ICD-10-CM | POA: Insufficient documentation

## 2014-11-03 DIAGNOSIS — G4730 Sleep apnea, unspecified: Secondary | ICD-10-CM | POA: Diagnosis not present

## 2014-11-03 DIAGNOSIS — F419 Anxiety disorder, unspecified: Secondary | ICD-10-CM | POA: Insufficient documentation

## 2014-11-03 HISTORY — PX: NEPHROLITHOTOMY: SHX5134

## 2014-11-03 LAB — CBC WITH DIFFERENTIAL/PLATELET
BASOS ABS: 0 10*3/uL (ref 0.0–0.1)
BASOS PCT: 0 % (ref 0–1)
EOS ABS: 0.1 10*3/uL (ref 0.0–0.7)
Eosinophils Relative: 1 % (ref 0–5)
HEMATOCRIT: 46.7 % (ref 39.0–52.0)
HEMOGLOBIN: 15.8 g/dL (ref 13.0–17.0)
LYMPHS ABS: 1.7 10*3/uL (ref 0.7–4.0)
LYMPHS PCT: 35 % (ref 12–46)
MCH: 32.6 pg (ref 26.0–34.0)
MCHC: 33.8 g/dL (ref 30.0–36.0)
MCV: 96.3 fL (ref 78.0–100.0)
MONO ABS: 0.4 10*3/uL (ref 0.1–1.0)
MONOS PCT: 8 % (ref 3–12)
NEUTROS ABS: 2.7 10*3/uL (ref 1.7–7.7)
NEUTROS PCT: 56 % (ref 43–77)
Platelets: 160 10*3/uL (ref 150–400)
RBC: 4.85 MIL/uL (ref 4.22–5.81)
RDW: 12.8 % (ref 11.5–15.5)
WBC: 4.8 10*3/uL (ref 4.0–10.5)

## 2014-11-03 LAB — BASIC METABOLIC PANEL
ANION GAP: 6 (ref 5–15)
BUN: 15 mg/dL (ref 6–23)
CHLORIDE: 108 mmol/L (ref 96–112)
CO2: 26 mmol/L (ref 19–32)
Calcium: 9.2 mg/dL (ref 8.4–10.5)
Creatinine, Ser: 1.19 mg/dL (ref 0.50–1.35)
GFR calc Af Amer: 79 mL/min — ABNORMAL LOW (ref >90)
GFR calc non Af Amer: 68 mL/min — ABNORMAL LOW (ref >90)
Glucose, Bld: 127 mg/dL — ABNORMAL HIGH (ref 70–99)
Potassium: 4.1 mmol/L (ref 3.5–5.1)
Sodium: 140 mmol/L (ref 135–145)

## 2014-11-03 LAB — PROTIME-INR
INR: 0.95 (ref 0.00–1.49)
PROTHROMBIN TIME: 12.7 s (ref 11.6–15.2)

## 2014-11-03 LAB — GLUCOSE, CAPILLARY: Glucose-Capillary: 118 mg/dL — ABNORMAL HIGH (ref 70–99)

## 2014-11-03 SURGERY — NEPHROLITHOTOMY PERCUTANEOUS
Anesthesia: General | Laterality: Right

## 2014-11-03 MED ORDER — CEFAZOLIN SODIUM-DEXTROSE 2-3 GM-% IV SOLR
2.0000 g | INTRAVENOUS | Status: AC
  Administered 2014-11-03: 2 g via INTRAVENOUS

## 2014-11-03 MED ORDER — ONDANSETRON HCL 4 MG/2ML IJ SOLN
INTRAMUSCULAR | Status: AC
  Filled 2014-11-03: qty 2

## 2014-11-03 MED ORDER — PROPOFOL 10 MG/ML IV BOLUS
INTRAVENOUS | Status: DC | PRN
  Administered 2014-11-03: 40 mg via INTRAVENOUS
  Administered 2014-11-03: 10 mg via INTRAVENOUS

## 2014-11-03 MED ORDER — OXYCODONE-ACETAMINOPHEN 5-325 MG PO TABS
1.0000 | ORAL_TABLET | ORAL | Status: DC | PRN
  Administered 2014-11-03 – 2014-11-04 (×2): 1 via ORAL
  Administered 2014-11-04 (×2): 2 via ORAL
  Administered 2014-11-04: 1 via ORAL
  Administered 2014-11-05: 2 via ORAL
  Filled 2014-11-03: qty 1
  Filled 2014-11-03: qty 2
  Filled 2014-11-03 (×2): qty 1
  Filled 2014-11-03: qty 2
  Filled 2014-11-03: qty 1
  Filled 2014-11-03: qty 2

## 2014-11-03 MED ORDER — CISATRACURIUM BESYLATE 20 MG/10ML IV SOLN
INTRAVENOUS | Status: AC
  Filled 2014-11-03: qty 10

## 2014-11-03 MED ORDER — MIDAZOLAM HCL 5 MG/5ML IJ SOLN
INTRAMUSCULAR | Status: DC | PRN
  Administered 2014-11-03: 2 mg via INTRAVENOUS

## 2014-11-03 MED ORDER — PROPOFOL 10 MG/ML IV BOLUS
INTRAVENOUS | Status: AC
  Filled 2014-11-03: qty 20

## 2014-11-03 MED ORDER — FENTANYL CITRATE 0.05 MG/ML IJ SOLN
INTRAMUSCULAR | Status: AC
  Filled 2014-11-03: qty 2

## 2014-11-03 MED ORDER — ACETAMINOPHEN 325 MG PO TABS: 650.0000 mg | ORAL_TABLET | ORAL | Status: DC | PRN

## 2014-11-03 MED ORDER — FENTANYL CITRATE 0.05 MG/ML IJ SOLN
INTRAMUSCULAR | Status: AC
  Administered 2014-11-03: 50 ug via INTRAVENOUS
  Filled 2014-11-03: qty 2

## 2014-11-03 MED ORDER — MIDAZOLAM HCL 2 MG/2ML IJ SOLN
INTRAMUSCULAR | Status: AC | PRN
  Administered 2014-11-03 (×4): 1 mg via INTRAVENOUS

## 2014-11-03 MED ORDER — CEFAZOLIN SODIUM-DEXTROSE 2-3 GM-% IV SOLR
INTRAVENOUS | Status: AC
Start: 2014-11-03 — End: 2014-11-03
  Filled 2014-11-03: qty 50

## 2014-11-03 MED ORDER — ONDANSETRON HCL 4 MG/2ML IJ SOLN
INTRAMUSCULAR | Status: DC | PRN
  Administered 2014-11-03: 4 mg via INTRAVENOUS

## 2014-11-03 MED ORDER — MIDAZOLAM HCL 2 MG/2ML IJ SOLN
INTRAMUSCULAR | Status: AC
  Filled 2014-11-03: qty 6

## 2014-11-03 MED ORDER — OXYCODONE HCL ER 20 MG PO T12A: 20.0000 mg | EXTENDED_RELEASE_TABLET | Freq: Two times a day (BID) | ORAL | Status: DC

## 2014-11-03 MED ORDER — MIDAZOLAM HCL 2 MG/2ML IJ SOLN
INTRAMUSCULAR | Status: AC
  Filled 2014-11-03: qty 2

## 2014-11-03 MED ORDER — FENTANYL CITRATE 0.05 MG/ML IJ SOLN
INTRAMUSCULAR | Status: DC | PRN
  Administered 2014-11-03 (×2): 100 ug via INTRAVENOUS
  Administered 2014-11-03: 50 ug via INTRAVENOUS

## 2014-11-03 MED ORDER — LIDOCAINE HCL 1 % IJ SOLN
INTRAMUSCULAR | Status: DC | PRN
  Administered 2014-11-03: 80 mg via INTRADERMAL

## 2014-11-03 MED ORDER — IOHEXOL 300 MG/ML  SOLN
1.0000 mL | Freq: Once | INTRAMUSCULAR | Status: AC | PRN
  Administered 2014-11-03: 10 mL

## 2014-11-03 MED ORDER — CEFAZOLIN SODIUM-DEXTROSE 2-3 GM-% IV SOLR: 2.0000 g | Freq: Once | INTRAVENOUS | Status: DC

## 2014-11-03 MED ORDER — SODIUM CHLORIDE 0.9 % IR SOLN
Status: DC | PRN
  Administered 2014-11-03: 12000 mL

## 2014-11-03 MED ORDER — NEOSTIGMINE METHYLSULFATE 10 MG/10ML IV SOLN
INTRAVENOUS | Status: DC | PRN
  Administered 2014-11-03: 4 mg via INTRAVENOUS

## 2014-11-03 MED ORDER — HYOSCYAMINE SULFATE 0.125 MG SL SUBL
0.1250 mg | SUBLINGUAL_TABLET | SUBLINGUAL | Status: DC | PRN
  Administered 2014-11-03 – 2014-11-04 (×3): 0.125 mg via ORAL
  Filled 2014-11-03 (×5): qty 1

## 2014-11-03 MED ORDER — LACTATED RINGERS IV SOLN
INTRAVENOUS | Status: DC | PRN
  Administered 2014-11-03 (×2): via INTRAVENOUS

## 2014-11-03 MED ORDER — CISATRACURIUM BESYLATE (PF) 10 MG/5ML IV SOLN
INTRAVENOUS | Status: DC | PRN
  Administered 2014-11-03: 6 mg via INTRAVENOUS

## 2014-11-03 MED ORDER — GLYCOPYRROLATE 0.2 MG/ML IJ SOLN
INTRAMUSCULAR | Status: DC | PRN
  Administered 2014-11-03: 0.4 mg via INTRAVENOUS

## 2014-11-03 MED ORDER — HYDROMORPHONE HCL 1 MG/ML IJ SOLN
0.5000 mg | INTRAMUSCULAR | Status: DC | PRN
  Administered 2014-11-03: 0.5 mg via INTRAVENOUS
  Administered 2014-11-03: 1 mg via INTRAVENOUS
  Administered 2014-11-03: 0.5 mg via INTRAVENOUS
  Administered 2014-11-04 (×5): 1 mg via INTRAVENOUS
  Filled 2014-11-03 (×8): qty 1

## 2014-11-03 MED ORDER — LACTATED RINGERS IV SOLN
INTRAVENOUS | Status: DC
  Administered 2014-11-03: 1000 mL via INTRAVENOUS

## 2014-11-03 MED ORDER — CEFAZOLIN SODIUM-DEXTROSE 2-3 GM-% IV SOLR
2.0000 g | Freq: Once | INTRAVENOUS | Status: AC
  Administered 2014-11-03: 2 g via INTRAVENOUS

## 2014-11-03 MED ORDER — FENTANYL CITRATE 0.05 MG/ML IJ SOLN
INTRAMUSCULAR | Status: AC
  Filled 2014-11-03: qty 4

## 2014-11-03 MED ORDER — PROMETHAZINE HCL 25 MG/ML IJ SOLN: 6.2500 mg | INTRAMUSCULAR | Status: DC | PRN

## 2014-11-03 MED ORDER — GLYCOPYRROLATE 0.2 MG/ML IJ SOLN
INTRAMUSCULAR | Status: AC
  Filled 2014-11-03: qty 2

## 2014-11-03 MED ORDER — IOHEXOL 300 MG/ML  SOLN: 1.0000 mL | Freq: Once | INTRAMUSCULAR | Status: DC | PRN

## 2014-11-03 MED ORDER — LIDOCAINE HCL (CARDIAC) 20 MG/ML IV SOLN
INTRAVENOUS | Status: AC
  Filled 2014-11-03: qty 5

## 2014-11-03 MED ORDER — HYDROMORPHONE HCL 1 MG/ML IJ SOLN
INTRAMUSCULAR | Status: DC | PRN
  Administered 2014-11-03: .6 mg via INTRAVENOUS
  Administered 2014-11-03: .4 mg via INTRAVENOUS
  Administered 2014-11-03: .2 mg via INTRAVENOUS
  Administered 2014-11-03 (×2): .4 mg via INTRAVENOUS

## 2014-11-03 MED ORDER — LIDOCAINE HCL 1 % IJ SOLN
INTRAMUSCULAR | Status: AC
  Filled 2014-11-03: qty 20

## 2014-11-03 MED ORDER — SODIUM CHLORIDE 0.9 % IV SOLN
INTRAVENOUS | Status: DC
  Administered 2014-11-03: 1000 mL via INTRAVENOUS
  Administered 2014-11-04 (×2): via INTRAVENOUS

## 2014-11-03 MED ORDER — CEFAZOLIN SODIUM-DEXTROSE 2-3 GM-% IV SOLR
INTRAVENOUS | Status: AC
  Filled 2014-11-03: qty 50

## 2014-11-03 MED ORDER — SODIUM CHLORIDE 0.9 % IV SOLN
INTRAVENOUS | Status: DC
  Administered 2014-11-03: 08:00:00 via INTRAVENOUS

## 2014-11-03 MED ORDER — HYDROMORPHONE HCL 2 MG/ML IJ SOLN
INTRAMUSCULAR | Status: AC
  Filled 2014-11-03: qty 1

## 2014-11-03 MED ORDER — FENTANYL CITRATE 0.05 MG/ML IJ SOLN
50.0000 ug | INTRAMUSCULAR | Status: DC | PRN
  Administered 2014-11-03 (×2): 50 ug via INTRAVENOUS

## 2014-11-03 MED ORDER — SUCCINYLCHOLINE CHLORIDE 20 MG/ML IJ SOLN
INTRAMUSCULAR | Status: DC | PRN
  Administered 2014-11-03: 100 mg via INTRAVENOUS

## 2014-11-03 MED ORDER — CEFAZOLIN SODIUM 1-5 GM-% IV SOLN
1.0000 g | Freq: Three times a day (TID) | INTRAVENOUS | Status: AC
  Administered 2014-11-03 – 2014-11-04 (×2): 1 g via INTRAVENOUS
  Filled 2014-11-03 (×2): qty 50

## 2014-11-03 MED ORDER — NEOSTIGMINE METHYLSULFATE 10 MG/10ML IV SOLN
INTRAVENOUS | Status: AC
  Filled 2014-11-03: qty 1

## 2014-11-03 MED ORDER — FENTANYL CITRATE 0.05 MG/ML IJ SOLN
INTRAMUSCULAR | Status: AC | PRN
  Administered 2014-11-03 (×2): 50 ug via INTRAVENOUS

## 2014-11-03 MED ORDER — EPHEDRINE SULFATE 50 MG/ML IJ SOLN
INTRAMUSCULAR | Status: DC | PRN
  Administered 2014-11-03: 10 mg via INTRAVENOUS

## 2014-11-03 MED ORDER — ONDANSETRON HCL 4 MG/2ML IJ SOLN
4.0000 mg | INTRAMUSCULAR | Status: DC | PRN
  Administered 2014-11-03: 4 mg via INTRAVENOUS
  Filled 2014-11-03: qty 2

## 2014-11-03 MED ORDER — FENTANYL CITRATE 0.05 MG/ML IJ SOLN
25.0000 ug | INTRAMUSCULAR | Status: DC | PRN
  Administered 2014-11-03 (×3): 50 ug via INTRAVENOUS

## 2014-11-03 MED ORDER — MEPERIDINE HCL 50 MG/ML IJ SOLN: 6.2500 mg | INTRAMUSCULAR | Status: DC | PRN

## 2014-11-03 MED ORDER — LACTATED RINGERS IV SOLN: INTRAVENOUS | Status: DC

## 2014-11-03 MED ORDER — ZOLPIDEM TARTRATE 5 MG PO TABS
5.0000 mg | ORAL_TABLET | Freq: Every evening | ORAL | Status: DC | PRN
  Administered 2014-11-03 – 2014-11-04 (×4): 5 mg via ORAL
  Filled 2014-11-03 (×4): qty 1

## 2014-11-03 MED ORDER — FENTANYL CITRATE 0.05 MG/ML IJ SOLN
INTRAMUSCULAR | Status: AC
  Filled 2014-11-03: qty 5

## 2014-11-03 MED ORDER — IOHEXOL 300 MG/ML  SOLN
INTRAMUSCULAR | Status: DC | PRN
  Administered 2014-11-03: 30 mL

## 2014-11-03 SURGICAL SUPPLY — 55 items
APL ESCP 34 STRL LF DISP (HEMOSTASIS)
APL SKNCLS STERI-STRIP NONHPOA (GAUZE/BANDAGES/DRESSINGS) ×1
APPLICATOR SURGIFLO ENDO (HEMOSTASIS) IMPLANT
BAG URINE DRAINAGE (UROLOGICAL SUPPLIES) ×2 IMPLANT
BASKET ZERO TIP NITINOL 2.4FR (BASKET) IMPLANT
BENZOIN TINCTURE PRP APPL 2/3 (GAUZE/BANDAGES/DRESSINGS) ×2 IMPLANT
BLADE SURG 15 STRL LF DISP TIS (BLADE) ×1 IMPLANT
BLADE SURG 15 STRL SS (BLADE) ×2
BSKT STON RTRVL ZERO TP 2.4FR (BASKET)
CATCHER STONE W/TUBE ADAPTER (UROLOGICAL SUPPLIES) IMPLANT
CATH BEACON 5.038 65CM KMP-01 (CATHETERS) IMPLANT
CATH FOLEY 2W COUNCIL 20FR 5CC (CATHETERS) ×1 IMPLANT
CATH FOLEY 2WAY SLVR  5CC 18FR (CATHETERS) ×1
CATH FOLEY 2WAY SLVR 5CC 18FR (CATHETERS) ×1 IMPLANT
CATH X-FORCE N30 NEPHROSTOMY (TUBING) ×2 IMPLANT
COVER SURGICAL LIGHT HANDLE (MISCELLANEOUS) ×2 IMPLANT
DRAPE C-ARM 42X120 X-RAY (DRAPES) ×2 IMPLANT
DRAPE CAMERA CLOSED 9X96 (DRAPES) ×1 IMPLANT
DRAPE LINGEMAN PERC (DRAPES) ×2 IMPLANT
DRAPE SURG IRRIG POUCH 19X23 (DRAPES) ×2 IMPLANT
DRSG PAD ABDOMINAL 8X10 ST (GAUZE/BANDAGES/DRESSINGS) ×4 IMPLANT
DRSG TEGADERM 8X12 (GAUZE/BANDAGES/DRESSINGS) ×4 IMPLANT
FIBER LASER FLEXIVA 1000 (UROLOGICAL SUPPLIES) IMPLANT
FIBER LASER FLEXIVA 200 (UROLOGICAL SUPPLIES) IMPLANT
FIBER LASER FLEXIVA 365 (UROLOGICAL SUPPLIES) IMPLANT
FIBER LASER FLEXIVA 550 (UROLOGICAL SUPPLIES) IMPLANT
FIBER LASER TRAC TIP (UROLOGICAL SUPPLIES) IMPLANT
FLOSEAL 10ML (HEMOSTASIS) ×2 IMPLANT
GAUZE SPONGE 4X4 12PLY STRL (GAUZE/BANDAGES/DRESSINGS) ×2 IMPLANT
GAUZE SPONGE 4X4 16PLY XRAY LF (GAUZE/BANDAGES/DRESSINGS) ×1 IMPLANT
GLOVE BIOGEL M 8.0 STRL (GLOVE) ×2 IMPLANT
GOWN STRL REUS W/TWL XL LVL3 (GOWN DISPOSABLE) ×2 IMPLANT
GUIDEWIRE AMPLAZ .035X145 (WIRE) ×2 IMPLANT
GUIDEWIRE STR DUAL SENSOR (WIRE) IMPLANT
HOLDER FOLEY CATH W/STRAP (MISCELLANEOUS) ×2 IMPLANT
KIT BASIN OR (CUSTOM PROCEDURE TRAY) ×2 IMPLANT
MANIFOLD NEPTUNE II (INSTRUMENTS) ×2 IMPLANT
MARKER SKIN DUAL TIP RULER LAB (MISCELLANEOUS) ×2 IMPLANT
NS IRRIG 1000ML POUR BTL (IV SOLUTION) ×2 IMPLANT
PACK BASIC VI WITH GOWN DISP (CUSTOM PROCEDURE TRAY) ×2 IMPLANT
PROBE LITHOCLAST ULTRA 3.8X403 (UROLOGICAL SUPPLIES) IMPLANT
PROBE PNEUMATIC 1.0MMX570MM (UROLOGICAL SUPPLIES) IMPLANT
SET IRRIG Y TYPE TUR BLADDER L (SET/KITS/TRAYS/PACK) ×2 IMPLANT
SET WARMING FLUID IRRIGATION (MISCELLANEOUS) IMPLANT
SHEATH PEELAWAY SET 9 (SHEATH) ×1 IMPLANT
SHIELD EYE BINOCULAR (MISCELLANEOUS) IMPLANT
STONE CATCHER W/TUBE ADAPTER (UROLOGICAL SUPPLIES) IMPLANT
SUT MNCRL AB 4-0 PS2 18 (SUTURE) IMPLANT
SUT SILK 2 0 30  PSL (SUTURE)
SUT SILK 2 0 30 PSL (SUTURE) IMPLANT
SYR 20CC LL (SYRINGE) ×4 IMPLANT
SYRINGE 10CC LL (SYRINGE) ×2 IMPLANT
TOWEL OR NON WOVEN STRL DISP B (DISPOSABLE) ×2 IMPLANT
TRAY FOLEY CATH 14FRSI W/METER (CATHETERS) IMPLANT
TUBING CONNECTING 10 (TUBING) ×4 IMPLANT

## 2014-11-03 NOTE — Procedures (Signed)
R 29f nephroureteral cath placed No complication No blood loss. See complete dictation in [facility]

## 2014-11-03 NOTE — Progress Notes (Signed)
Large clot noted in proximal portion of nephrostomy tubing. Unable to get clot to pass down to bag. Replaced drainage bag. Will monitor output.  Laylee Schooley, Julieta Gutting RN

## 2014-11-03 NOTE — H&P (Signed)
Chief Complaint: Nephrolithiasis  Referring Physician(s): Ottelin,Mark C  History of Present Illness: Hector [name] is a 54 y.o. male   Pt with long hx renal stones and treatment Now with enlarging Rt renal stone and scheduled for OR with Dr Karsten Ro-- Rt nephrolithotomy Prior to OR procedure pt is scheduled in IR for Rt percutaneous nephrostomy placement  Past Medical History  Diagnosis Date  . Hyperlipidemia   . Abnormal LFTs     improved after gastic bypass  . History of concussion   . Gout   . Perforated ulcer     Surgical repair9/24 2011  . B12 nutritional deficiency   . Closed head injury     laceration  9 12  no loc  vision change  . History of renal stone     Seen in emergency room not urologist  . Chest pain     hopsitalized 2 14 evaluation  . OBESITY 12/10/2007    Qualifier: Diagnosis of  By: [name] MD, Standley Brooking Gastric bypass   . 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   [name] GERD (gastroesophageal reflux disease)     no meds  . DDD (degenerative disc disease)   . 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 Sleep apnea     had gastric bypass/ sa-gone  . OBSTRUCTIVE SLEEP APNEA 07/22/2007    Qualifier: Diagnosis of  By: [name] RN, Doroteo Bradford    . OSA (obstructive sleep apnea) 12/12/2013  . Diabetes mellitus     resolved with gastric bypass  . DIABETES MELLITUS, TYPE II 04/06/2007    Qualifier: Diagnosis of  By: [name] MD, Standley Brooking Improved and off meds since gastric bypass   . History of bunionectomy of left great toe   . Anginal pain   . Anxiety   . Headache     once per week uses Indomethicin hx of cluster headaches     Past Surgical History  Procedure Laterality Date  . Vasectomy    . Nasal septoplasty w/ turbinoplasty      x 2  . Gastric bypass  11/06/09    beginning weight 267  . Epidural block injection      by Dr. Nelva Bush  .  Surgerical repair of stomach ulcer  06/08/10    per  . Hiatal hernia repair N/A 01/20/2013    Procedure: LAPAROSCOPIC REPAIR OF INTERNAL HERNIA;  Surgeon: Shann Medal, MD;  Location: [facility];  Service: General;  Laterality: N/A;  . Back surgery  2012    Dr. Dorina Hoyer disk  . Tonsillectomy    . Uvulopalatopharyngoplasty  2007    with tonsils  . Shoulder arthroscopy  2014    rt  . Carpal tunnel release  2008    left-ulnar nerve decomp  . Mass excision Right 04/18/2013    Procedure: RIGHT SMALL FINGER SKIN LESION EXCISION;  Surgeon: Jolyn Nap, MD;  Location: [facility];  Service: Orthopedics;  Laterality: Right;  . Carpel tunnel Left   . Lumbar laminectomy/decompression microdiscectomy Left 06/07/2014    Procedure: MICRO LUMBAR DECOMPRESSION L5 - S1 ON THE LEFT/L5 FORAMINOTOMY/EXCISION SYNOVIAL CYST  1 LEVEL;  Surgeon: [name], MD;  Location: [facility];  Service: Orthopedics;  Laterality: Left;  . Colonscopy     . Hernia repair  5053    umbilical  . Foot  surgery      bilat for removal of extra bone     Allergies: Aspirin and Hydrocodone  Medications: Prior to Admission medications   Medication Sig Start Date End Date Taking? Authorizing Provider  acetaminophen (TYLENOL) 500 MG tablet Take 1,000 mg by mouth every 6 (six) hours as needed for moderate pain.     Historical Provider, MD  amitriptyline (ELAVIL) 50 MG tablet TAKE 1 TABLET AS NEEDED FOR SLEEP 10/04/14   Burnis Medin, MD  B Complex-C (B-COMPLEX WITH VITAMIN C) tablet Take 1 tablet by mouth daily.    Historical Provider, MD  calcium citrate (CALCITRATE) 950 MG tablet Take 1 tablet by mouth daily.     Historical Provider, MD  cholecalciferol (VITAMIN D) 1000 UNITS tablet Take 1,000 Units by mouth daily.    Historical Provider, MD  escitalopram (LEXAPRO) 20 MG tablet Take 20 mg by mouth every morning.     Historical Provider, MD  indomethacin (INDOCIN) 50 MG capsule TAKE 1 CAPSULE BY MOUTH 3 TIMES A  DAY AS NEEDED Patient not taking: Reported on 10/25/2014 09/01/14   Burnis Medin, MD  lamoTRIgine (LAMICTAL) 100 MG tablet Take 100-200 mg by mouth 2 (two) times daily. Take 200mg  qam and 100mg  qhs.    Historical Provider, MD  methocarbamol (ROBAXIN) 500 MG tablet Take 500 mg by mouth every 8 (eight) hours as needed for muscle spasms.    Historical Provider, MD  Multiple Vitamin (MULTIVITAMIN WITH MINERALS) TABS Take 1 tablet by mouth daily.    Historical Provider, MD  ondansetron (ZOFRAN ODT) 8 MG disintegrating tablet 8mg  ODT q4 hours prn nausea Patient taking differently: Take 8 mg by mouth every 4 (four) hours as needed for nausea. 8mg  ODT q4 hours prn nausea 07/14/14   Hannah Muthersbaugh, PA-C  oxyCODONE-acetaminophen (PERCOCET/ROXICET) 5-325 MG per tablet Take 1-2 tablets by mouth every 4 (four) hours as needed for moderate pain or severe pain. 07/14/14   Hannah Muthersbaugh, PA-C  tamsulosin (FLOMAX) 0.4 MG CAPS Take 0.4 mg by mouth daily after breakfast. 03/02/13   Linus Mako, PA-C  zolpidem (AMBIEN) 10 MG tablet Take 1 tablet (10 mg total) by mouth at bedtime as needed. For sleep 06/24/13   Burnis Medin, MD     Family History  Problem Relation Age of Onset  . Uterine cancer Mother   . Hypertension Mother   . Cervical cancer Mother   . Heart disease Father   . COPD Father   . Colon cancer Maternal Uncle   . Colon cancer Paternal Aunt     History   Social History  . Marital Status: Married    Spouse Name: Hector [name]  . Number of Children: 2  . Years of Education: Associates   Occupational History  . RADAR TECH    Social History Main Topics  . Smoking status: Never Smoker   . Smokeless tobacco: Never Used  . Alcohol Use: Yes     Comment: occas liquor   . Drug Use: No  . Sexual Activity: Yes   Other Topics Concern  . 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   Patient has two children.   Patient has a  college education.   Regular exercise-no   Employed Event organiser and investigation   Now in school travels       10 hours work     Sleep  No longer needs  cpap  Patient is right-handed.   Patient drinks one soda daily.          Review of Systems: A 12 point ROS discussed and pertinent positives are indicated in the HPI above.  All other systems are negative.  Review of Systems  Constitutional: Negative for fever and activity change.  Respiratory: Negative for cough and shortness of breath.   Cardiovascular: Negative for chest pain.  Gastrointestinal: Negative for nausea and abdominal pain.  Genitourinary: Negative for difficulty urinating.  Musculoskeletal: Negative for back pain.  Psychiatric/Behavioral: Negative for behavioral problems and confusion.    Vital Signs: BP: 147/96; R 16; T: 98; HR: 84  Physical Exam  Constitutional: He is oriented to person, place, and time. He appears well-nourished.  Cardiovascular: Normal rate, regular rhythm and normal heart sounds.   No murmur heard. Pulmonary/Chest: Effort normal and breath sounds normal. He has no wheezes.  Abdominal: Soft. Bowel sounds are normal. There is no tenderness.  Musculoskeletal: Normal range of motion.  Neurological: He is alert and oriented to person, place, and time.  Skin: Skin is warm and dry.  Psychiatric: He has a normal mood and affect. His behavior is normal. Judgment and thought content normal.  Nursing note and vitals reviewed.   Mallampati Score:  MD Evaluation Airway: WNL Heart: WNL Abdomen: WNL Chest/ Lungs: WNL ASA  Classification: 2 Mallampati/Airway Score: One  Imaging: Dg Chest 2 View  10/27/2014   CLINICAL DATA:  Preoperative films.  EXAM: CHEST  2 VIEW  COMPARISON:  Single view of the chest 07/14/2014.  FINDINGS: Heart size and mediastinal contours are within normal limits. Both lungs are clear. Visualized skeletal structures are unremarkable.  IMPRESSION: No acute disease.    Electronically Signed   By: Inge Rise M.D.   On: 10/27/2014 13:41    Labs:  CBC:  Recent Labs  06/05/14 1335 07/14/14 1547 10/27/14 1025 11/03/14 0755  WBC 4.5 5.0 6.4 4.8  HGB 14.2 14.1 16.6 15.8  HCT 42.4 40.9 50.0 46.7  PLT 160 160 221 160    COAGS:  Recent Labs  11/03/14 0755  INR 0.95    BMP:  Recent Labs  06/05/14 1335 06/08/14 0442 07/14/14 1547 10/16/14 0851 11/03/14 0755  NA 142 144 141 140 140  K 4.7 4.7 3.9 3.8 4.1  CL 105 106 102 103 108  CO2 27 26 26 30 26   GLUCOSE 128* 103* 127* 116* 127*  BUN 12 8 12 12 15   CALCIUM 9.3 9.1 9.6 9.3 9.2  CREATININE 1.15 1.18 1.07 1.18 1.19  GFRNONAA 72* 69* 78*  --  68*  GFRAA 83* 80* >90  --  79*    LIVER FUNCTION TESTS:  Recent Labs  05/15/14 1426 07/14/14 1547  BILITOT 0.4 0.4  AST 18 23  ALT 14 19  ALKPHOS 78 95  PROT 7.2 7.7  ALBUMIN 4.2 4.6    TUMOR MARKERS: No results for input(s): AFPTM, CEA, CA199, CHROMGRNA in the last 8760 hours.  Assessment and Plan:  Rt Percutaneous nephrostomy placement prior to Rt nephrolithotomy in OR Pt aware of procedure benefits and risks including but not limited to: Infection; bleeding; organ damage; damage to surrounding structures Pt agreeable to proceed Consent signed andin chart  Thank you for this interesting consult.  I greatly enjoyed meeting EYTAN CARRIGAN and look forward to participating in their care.  Signed: Latecia Miler A 11/03/2014, 8:46 AM   I spent a total of  20 Minutes  in face to face in  clinical consultation, greater than 50% of which was counseling/coordinating care for R PCN placement

## 2014-11-03 NOTE — Anesthesia Postprocedure Evaluation (Signed)
  Anesthesia Post-op Note  Patient: Hector [name]  Procedure(s) Performed: Procedure(s) (LRB): RIGHT PERCUTANEOUS NEPHROLITHOTOMY (Right)  Patient Location: PACU  Anesthesia Type: General  Level of Consciousness: awake and alert   Airway and Oxygen Therapy: Patient Spontanous Breathing  Post-op Pain: mild  Post-op Assessment: Post-op Vital signs reviewed, Patient's Cardiovascular Status Stable, Respiratory Function Stable, Patent Airway and No signs of Nausea or vomiting  Last Vitals:  Filed Vitals:   11/03/14 1419  BP: 140/80  Pulse: 72  Temp: 36.6 C  Resp: 14    Post-op Vital Signs: stable   Complications: No apparent anesthesia complications

## 2014-11-03 NOTE — Transfer of Care (Signed)
Immediate Anesthesia Transfer of Care Note  Patient: Hector [name]  Procedure(s) Performed: Procedure(s): RIGHT PERCUTANEOUS NEPHROLITHOTOMY (Right)  Patient Location: PACU  Anesthesia Type:General  Level of Consciousness: awake, alert  and oriented  Airway & Oxygen Therapy: Patient Spontanous Breathing and Patient connected to face mask oxygen  Post-op Assessment: Report given to RN  Post vital signs: Reviewed and stable  Last Vitals:  Filed Vitals:   11/03/14 1046  BP: 135/101  Pulse: 72  Temp:   Resp: 14    Complications: No apparent anesthesia complications

## 2014-11-03 NOTE — H&P (Signed)
Mr. Hector [name] is a 54 year old male with a history of nephrolithiasis.   History of Present Illness Calculus disease: He has passed several stones spontaneously In 5/14 an ultrasound showed no hydronephrosis, but did show some nonobstructing renal calculi. A CT scan in 5/14 revealed a nonobstructing 3 mm stone in the right lower pole and a 5 mm stone in the left lower pole.   Risk factors: Gastric bypass surgery in 2011.    Interval history: He reports she's been having some pain on the left-hand side and has been taking Flomax thinking might be passing a stone. He has not seen any gross hematuria. He said occasionally he will have some slight discomfort on the right hand side.     Past Medical History Problems  1. History of Acute Esophageal Ulcer With Perforation 2. History of Anxiety (F41.9) 3. History of Chronic Liver Disease 4. History of Concussion 5. History of Gastric ulcer (K25.9) 6. History of Gout (M10.9) 7. History of Head Injury 8. History of diabetes mellitus (Z86.39) 9. History of heartburn (Z87.898) 10. History of hyperlipidemia (Z86.39) 11. History of sleep apnea (Z87.09) 12. History of Vitamin B12 deficiency (E53.8)  Surgical History Problems  1. History of Back Surgery 2. History of Gastric Surgery 3. History of Gastrorrhaphy For Perforation Of Ulcer 4. History of Nerve Block Transforaminal Epidural 5. History of Repair Of Paraesophageal Hiatus Hernia 6. History of Septoplasty 7. History of Shoulder Surgery 8. History of Surgery Of Male Genitalia Vasectomy 9. History of Umbilical Hernia Repair  Current Meds 1. Lexapro 20 MG Oral Tablet;  Therapy: (Recorded:22Jul2014) to Recorded 2. Methocarbamol 500 MG Oral Tablet;  Therapy: 20Jun2014 to Recorded 3. Multi-Day Vitamins TABS;  Therapy: (Recorded:22Jul2014) to Recorded 4. Oxycodone-Acetaminophen 5-325 MG Oral Tablet; TAKE 1 TO 2 TABLETS EVERY 4 TO 6  HOURS AS NEEDED;  Therapy: [phone] to  (Evaluate:13Aug2014); Last Rx:11Aug2014 Ordered 5. Tamsulosin HCl - 0.4 MG Oral Capsule; 0.4 mg po daily;  Therapy: [phone] to (Evaluate:18Nov2015)  Requested for: 20Aug2015; Last  Rx:20Aug2015 Ordered 6. Vitamin B Complex Oral Tablet;  Therapy: (Recorded:22Jul2014) to Recorded 7. Zolpidem Tartrate 10 MG Oral Tablet;  Therapy: [id number] to Recorded  Allergies Medication  1. Hydrocodone-Acetaminophen TABS 2. NSAIDs  Family History Problems  1. Family history of Family Health Status - Father's Age : Father   7yrs 2. Family history of Family Health Status - Mother's Age : Mother   46yrs 3. Family history of Family Health Status Number Of Children   1 son and 1 duaghter 4. Family history of No Significant Family History  Social History Problems  1. Denied: History of Alcohol Use 2. Denied: History of Caffeine Use 3. Marital History - Currently Married 4. Never A Smoker 5. Occupation:   Dispensing optician 6. Denied: History of Tobacco Use   Review of Systems Genitourinary, constitutional, skin, eye, otolaryngeal, hematologic/lymphatic, cardiovascular, pulmonary, endocrine, musculoskeletal, gastrointestinal, neurological and psychiatric system(s) were reviewed and pertinent findings if present are noted.    Vitals Vital Signs  BMI Calculated: 26.77 BSA Calculated: 2.03 Height: 5 ft 10 in Weight: 187 lb  Blood Pressure: 122 / 80 Temperature: 98.6 F Heart Rate: 76  Physical Exam Constitutional: Well nourished and well developed . No acute distress.  ENT:. The ears and nose are normal in appearance.  Neck: The appearance of the neck is normal and no neck mass is present.  Pulmonary: No respiratory distress and normal respiratory rhythm and effort.  Cardiovascular: Heart rate and rhythm are normal . No peripheral  edema.  Abdomen: The abdomen is soft and nontender. No masses are palpated. No CVA tenderness. No hernias are palpable. No hepatosplenomegaly noted.   Genitourinary: Examination of the penis demonstrates no discharge, no masses, no lesions and a normal meatus. The scrotum is without lesions. The right epididymis is palpably normal and non-tender. The left epididymis is palpably normal and non-tender. The right testis is non-tender and without masses. The left testis is non-tender and without masses.  Skin: Normal skin turgor, no visible rash and no visible skin lesions.  Neuro/Psych:. Mood and affect are appropriate.     The following images/tracing/specimen were independently visualized:  KUB: Stone in his right kidney measured 18 x 8 mm. It is about 1 mm larger than his scan done in 10/15 but has markedly increased in size from his KUB done a year earlier.  The following clinical lab reports were reviewed:  UA: clear.   Assessment Assessed  1. Renal calculus, right (N20.0)  He has developed a large stone in the right renal pelvic/lower pole region that has increased in size significantly over time. We therefore have discussed the need to treat this and I've gone over the options with him. We did discuss lithotripsy as an option but I told him it would require the placement of a stent preoperatively and would very possibly require 2 treatments. The other option would be a percutaneous nephrolithotomy. I went over this procedure with him in detail including how the procedure was performed, its risks and palpitations, any possibility of a nephrostomy tube or stent postoperatively as well as the need for overnight stay and we also discussed the anticipated postoperative course. He understands and is elected to proceed.  I told him that I found no abnormality along the course of his left ureter and do not think he is passing a stone on that side.   Plan   He will be scheduled for a right percutaneous nephrolithotomy.

## 2014-11-03 NOTE — Op Note (Signed)
PATIENT:  Hector [name]  PRE-OPERATIVE DIAGNOSIS: Right renal calculi  POST-OPERATIVE DIAGNOSIS: Same  PROCEDURE: 1. Right percutaneous nephrolithotomy (2.1 cm) 2. Right antegrade nephrostogram for tube positioning  SURGEON:  Claybon Jabs  INDICATION: Hector [name] is a 54 year old male with a history of calculus disease who was found to have a marked increase in the size of stones in the lower pole of his right kidney. We therefore discussed proceeding with treatment due to metabolic stone activity. The risks and complications were discussed and he elected to proceed.  ANESTHESIA:  General  EBL:  100 mL  DRAINS: 1. 18 French Foley catheter per urethra in the bladder. 2. 46 French council tip catheter as a nephrostomy tube  LOCAL MEDICATIONS USED:  None  SPECIMEN:  Stones given to patient.  Description of procedure: After informed consent the patient was taken to the operating room and administered general anesthesia on the stretcher. He was then moved from the stretcher to the operating room table in the prone position. His right flank was then sterilely prepped and draped in standard fashion. An official timeout was then performed.  I passed a 0.038 inch floppy-tipped guidewire through the radius replaced nephrostomy catheter into the bladder under fluoroscopy. I then removed the nephrostomy catheter, made a transverse incision over this location and passed the coaxial catheter over the guidewire. I then removed the central portion of the coaxial catheter and passed a second 0.038 inch floppy-tipped guidewire through this and down into the bladder also under fluoroscopic control. I used the UroMax nephrostomy dilating balloon and placed the tip at the level of the stone and inflated this. I then passed the 67 French nephrostomy sheath over the balloon under fluoroscopy to the level of the stone and deflated the balloon. One wire was affixed to the drape as a safety guidewire and a second  working guidewire was left in place.  The 27 French rigid nephroscope was then passed through the nephrostomy sheath into the kidney where I was able to identify what appeared to be stones in 2 separate locations in what appeared to be the lower pole calyx. I was able to grasp all of the stones with a 2 prong grasper and 3 prong grasper until all of the stones had been completely removed. I checked with fluoroscopy and noted no stones. I passed the scope to the level of the UPJ and noted no stones located at the UPJ.  A 20 French council tip catheter was then passed over the guidewire into the area the renal pelvis. I left this at this location and confirmed the anticipated renal pelvic location of the catheter with fluoroscopy. I then removed the working guidewire maintaining the catheter in this location. I then proceeded with a antegrade nephrostogram to confirm position.  An antegrade nephrostogram was performed by injecting 10 mL of full strength Omnipaque contrast through the Foley catheter and under direct fluoroscopy. It revealed the catheter was correctly positioned in the renal pelvis. There was no extravasation noted. I therefore inflated the balloon with approximately 2.5 mL of half-strength contrast. I noted contrast was passing down the ureter as well. I therefore removed the safety guidewire and backed the nephrostomy sheath over the catheter and cut the sheath off of the catheter. I then rechecked fluoroscopically to be sure the catheter was still in the same position and it was. I noted some bleeding from around the catheter that was deeper than skin level. I therefore injected FloSeal in the  subcutaneous tissue with good control the bleeding using this and applied pressure. I then secured the nephrostomy tube by placing a figure-of-eight 2-0 silk suture through the nephrostomy incision and secured this to the catheter. A sterile occlusive dressing was applied, the catheter was hooked to  closed system drainage and the patient was awakened and taken to the recovery room in stable and satisfactory condition. He tolerated the procedure well no intraoperative complications.    PLAN OF CARE: Discharge to home after PACU  PATIENT DISPOSITION:  PACU - hemodynamically stable.

## 2014-11-03 NOTE — Discharge Instructions (Signed)

## 2014-11-03 NOTE — Anesthesia Preprocedure Evaluation (Signed)
Anesthesia Evaluation  Patient identified by MRN, date of birth, ID band Patient awake    Reviewed: Allergy & Precautions, H&P , NPO status , Patient's Chart, lab work & pertinent test results  Airway Mallampati: II  TM Distance: >3 FB Neck ROM: Full    Dental no notable dental hx.    Pulmonary asthma , sleep apnea ,  breath sounds clear to auscultation  Pulmonary exam normal       Cardiovascular negative cardio ROS  Rhythm:Regular Rate:Normal     Neuro/Psych  Headaches, PSYCHIATRIC DISORDERS  Neuromuscular disease    GI/Hepatic Neg liver ROS, PUD, GERD-  ,  Endo/Other  diabetes, Type 2  Renal/GU negative Renal ROS  negative genitourinary   Musculoskeletal  (+) Arthritis -,   Abdominal   Peds negative pediatric ROS (+)  Hematology negative hematology ROS (+)   Anesthesia Other Findings   Reproductive/Obstetrics negative OB ROS                             Anesthesia Physical  Anesthesia Plan  ASA: III  Anesthesia Plan: General   Post-op Pain Management:    Induction: Intravenous  Airway Management Planned: Oral ETT  Additional Equipment:   Intra-op Plan:   Post-operative Plan: Extubation in OR  Informed Consent: I have reviewed the patients History and Physical, chart, labs and discussed the procedure including the risks, benefits and alternatives for the proposed anesthesia with the patient or authorized representative who has indicated his/her understanding and acceptance.   Dental advisory given  Plan Discussed with: CRNA  Anesthesia Plan Comments:         Anesthesia Quick Evaluation

## 2014-11-04 DIAGNOSIS — N200 Calculus of kidney: Secondary | ICD-10-CM | POA: Diagnosis not present

## 2014-11-04 LAB — HEMOGLOBIN AND HEMATOCRIT, BLOOD
HCT: 39.1 % (ref 39.0–52.0)
Hemoglobin: 12.5 g/dL — ABNORMAL LOW (ref 13.0–17.0)

## 2014-11-04 NOTE — Progress Notes (Signed)
1 Day Post-Op Subjective:   I saw and evaluated Hector [name] this morning. At that time he was having mild discomfort in his flank but the nephrostomy tube was draining well. Urine in the nephrostomy tube was moderately bloody with some stringy clots. His Foley catheter was still indwelling and draining clear urine suggesting very little going down the ureter. His Foley catheter was removed and we asked nursing services to clamp his nephrostomy tube. He began experiencing more discomfort and requiring additional pain medication. For that reason we elected to reattach his nephrostomy tube to gravity drainage and continued to observe him.   Objective: Vital signs in last 24 hours: Temp:  [98.2 F (36.8 C)-99.8 F (37.7 C)] 98.6 F (37 C) (02/20 1530) Pulse Rate:  [77-88] 88 (02/20 1328) Resp:  [16-20] 20 (02/20 1328) BP: (117-165)/(68-82) 165/82 mmHg (02/20 1328) SpO2:  [95 %-100 %] 99 % (02/20 1328)  Intake/Output from previous day: 02/19 0701 - 02/20 0700 In: 4025 [P.O.:240; I.V.:3685; IV Piggyback:100] Out: 1920 [Urine:1920] Intake/Output this shift:    Physical Exam:  Constitutional: Vital signs reviewed. WD WN in NAD   Eyes: PERRL, No scleral icterus.   Cardiovascular: RRR Pulmonary/Chest: Normal effort Abdominal: Soft. Non-tender, non-distended nephrostomy tube site without active oozing Genitourinary: Foley draining clear urine Extremities: No cyanosis or edema   Lab Results:  Recent Labs  11/03/14 0755 11/04/14 0549  HGB 15.8 12.5*  HCT 46.7 39.1   BMET  Recent Labs  11/03/14 0755  NA 140  K 4.1  CL 108  CO2 26  GLUCOSE 127*  BUN 15  CREATININE 1.19  CALCIUM 9.2    Recent Labs  11/03/14 0755  INR 0.95   No results for input([name]): LABURIN in the last 72 hours. Results for orders placed or performed during the hospital encounter of 10/27/14  Surgical pcr screen     Status: Abnormal   Collection Time: 10/27/14  9:57 AM  Result Value Ref Range Status    MRSA, PCR NEGATIVE NEGATIVE Final   Staphylococcus aureus POSITIVE (A) NEGATIVE Final    Comment:        The Xpert SA Assay (FDA approved for NASAL specimens in patients over 60 years of age), is one component of a comprehensive surveillance program.  Test performance has been validated by [facility] for patients greater than or equal to 27 year old. It is not intended to diagnose infection nor to guide or monitor treatment.     Studies/Results: Dg C-arm 1-60 Min-no Report  11/03/2014   CLINICAL DATA: renal stone   C-ARM 1-60 MINUTES  Fluoroscopy was utilized by the requesting physician.  No radiographic  interpretation.    Ir Ureteral Stent Right New Access W/o Sep Nephrostomy Cath  11/03/2014   CLINICAL DATA:  right nephrolithiasis. Preop percutaneous nephrolithotomy.  EXAM: RIGHT PERCUTANEOUS NEPHROURETERAL CATHETER PLACEMENT UNDER FLUOROSCOPIC GUIDANCE  FLUOROSCOPY TIME:  4 minutes 12 seconds  TECHNIQUE: The procedure, risks (including but not limited to bleeding, infection, organ damage ), benefits, and alternatives were explained to the patient. Questions regarding the procedure were encouraged and answered. The patient understands and consents to the procedure.  Rightflank region marked, then prepped with Betadine, draped in usual sterile fashion, infiltrated locally with 1% lidocaine.As antibiotic prophylaxis, cefazolin 2 g was ordered pre-procedure and administered intravenously within one hour of incision.  Intravenous Fentanyl and Versed were administered as conscious sedation during continuous cardiorespiratory monitoring by the radiology RN, with a total moderate sedation time of 19 minutes.  Under real-time fluoroscopic guidance, a 21-gauge trocar needle was advanced into a posterior lower pole calyx using the calculus as a guide. A 018 needle advanced centrally, moving the calculi. Needle was exchanged over a guidewire for transitional dilator. Parallel angled 035 glidewire was  advanced. The dilator was removed and a 5 [place] Kumpe catheter advanced into the renal collecting system. Contrast injection confirmed appropriate positioning. Catheter was negotiated down the ureter and coiled in the urinary bladder. Spot radiograph confirms appropriate positioning and patency. Catheter secured externally. The patient tolerated the procedure well. Transferred to preop holding.  COMPLICATIONS: COMPLICATIONS none  IMPRESSION: 1. Technically successful antegrade right percutaneous nephroureteral catheter placement.   Electronically Signed   By: Lucrezia Europe M.D.   On: 11/03/2014 12:43    Assessment/Plan:  We'll leave the nephrostomy tube to gravity drainage. We'll attempt plugging again in the morning. He continues to pain with plugging of the nephrostomy tube we can send him home with the nephrostomy tube to gravity drainage. We'll recheck hemoglobin in the a.m.    Hector [name] 11/04/2014, 8:44 PM

## 2014-11-05 DIAGNOSIS — N200 Calculus of kidney: Secondary | ICD-10-CM | POA: Diagnosis not present

## 2014-11-05 LAB — HEMOGLOBIN AND HEMATOCRIT, BLOOD
HEMATOCRIT: 36.8 % — AB (ref 39.0–52.0)
Hemoglobin: 12.2 g/dL — ABNORMAL LOW (ref 13.0–17.0)

## 2014-11-05 MED ORDER — OXYCODONE-ACETAMINOPHEN 5-325 MG PO TABS: 1.0000 | ORAL_TABLET | Freq: Four times a day (QID) | ORAL | Status: DC | PRN

## 2014-11-05 NOTE — Progress Notes (Signed)
Pt unable to tolerate nephrostomy being clamped, after about 31mins pain became sever and had to be connected to drain

## 2014-11-05 NOTE — Discharge Summary (Signed)
Patient ID: Hector [name] MRN: [id number] DOB/AGE: 53-May-1962 53 y.o.  Admit date: 11/03/2014 Discharge date: 11/05/2014    Discharge Diagnoses:   Present on Admission:  **None**  Consults:  None    Discharge Medications:   Medication List    TAKE these medications        acetaminophen 500 MG tablet  Commonly known as:  TYLENOL  Take 1,000 mg by mouth every 6 (six) hours as needed for moderate pain.     amitriptyline 50 MG tablet  Commonly known as:  ELAVIL  TAKE 1 TABLET AS NEEDED FOR SLEEP     B-complex with vitamin C tablet  Take 1 tablet by mouth daily.     calcium citrate 950 MG tablet  Commonly known as:  CALCITRATE - dosed in mg elemental calcium  Take 1 tablet by mouth daily.     cholecalciferol 1000 UNITS tablet  Commonly known as:  VITAMIN D  Take 1,000 Units by mouth daily.     escitalopram 20 MG tablet  Commonly known as:  LEXAPRO  Take 20 mg by mouth every morning.     indomethacin 50 MG capsule  Commonly known as:  INDOCIN  TAKE 1 CAPSULE BY MOUTH 3 TIMES A DAY AS NEEDED     lamoTRIgine 100 MG tablet  Commonly known as:  LAMICTAL  Take 100-200 mg by mouth 2 (two) times daily. Take 200mg  qam and 100mg  qhs.     methocarbamol 500 MG tablet  Commonly known as:  ROBAXIN  Take 500 mg by mouth every 8 (eight) hours as needed for muscle spasms.     multivitamin with minerals Tabs tablet  Take 1 tablet by mouth daily.     ondansetron 8 MG disintegrating tablet  Commonly known as:  ZOFRAN ODT  8mg  ODT q4 hours prn nausea     OxyCODONE 20 mg T12a 12 hr tablet  Commonly known as:  OXYCONTIN  Take 1 tablet (20 mg total) by mouth every 12 (twelve) hours.     oxyCODONE-acetaminophen 5-325 MG per tablet  Commonly known as:  PERCOCET/ROXICET  Take 1-2 tablets by mouth every 4 (four) hours as needed for moderate pain or severe pain.     oxyCODONE-acetaminophen 5-325 MG per tablet  Commonly known as:  ROXICET  Take 1-2 tablets by mouth every 6 (six)  hours as needed.     tamsulosin 0.4 MG Caps capsule  Commonly known as:  FLOMAX  Take 0.4 mg by mouth daily after breakfast.     zolpidem 10 MG tablet  Commonly known as:  AMBIEN  Take 1 tablet (10 mg total) by mouth at bedtime as needed. For sleep         Significant Diagnostic Studies:  Dg C-arm 1-60 Min-no Report  11/03/2014   CLINICAL DATA: renal stone   C-ARM 1-60 MINUTES  Fluoroscopy was utilized by the requesting physician.  No radiographic  interpretation.    Ir Ureteral Stent Right New Access W/o Sep Nephrostomy Cath  11/03/2014   CLINICAL DATA:  right nephrolithiasis. Preop percutaneous nephrolithotomy.  EXAM: RIGHT PERCUTANEOUS NEPHROURETERAL CATHETER PLACEMENT UNDER FLUOROSCOPIC GUIDANCE  FLUOROSCOPY TIME:  4 minutes 12 seconds  TECHNIQUE: The procedure, risks (including but not limited to bleeding, infection, organ damage ), benefits, and alternatives were explained to the patient. Questions regarding the procedure were encouraged and answered. The patient understands and consents to the procedure.  Rightflank region marked, then prepped with Betadine, draped in usual sterile fashion, infiltrated locally with  1% lidocaine.As antibiotic prophylaxis, cefazolin 2 g was ordered pre-procedure and administered intravenously within one hour of incision.  Intravenous Fentanyl and Versed were administered as conscious sedation during continuous cardiorespiratory monitoring by the radiology RN, with a total moderate sedation time of 19 minutes.  Under real-time fluoroscopic guidance, a 21-gauge trocar needle was advanced into a posterior lower pole calyx using the calculus as a guide. A 018 needle advanced centrally, moving the calculi. Needle was exchanged over a guidewire for transitional dilator. Parallel angled 035 glidewire was advanced. The dilator was removed and a 5 [place] Kumpe catheter advanced into the renal collecting system. Contrast injection confirmed appropriate positioning.  Catheter was negotiated down the ureter and coiled in the urinary bladder. Spot radiograph confirms appropriate positioning and patency. Catheter secured externally. The patient tolerated the procedure well. Transferred to preop holding.  COMPLICATIONS: COMPLICATIONS none  IMPRESSION: 1. Technically successful antegrade right percutaneous nephroureteral catheter placement.   Electronically Signed   By: Lucrezia Europe M.D.   On: 11/03/2014 12:43      Hospital Course:  Active Problems:   Renal calculi  patient underwent a right percutaneous nephrolithotomy by Dr. Kathie Rhodes for a 2 cm stone in the right kidney. The procedure itself was uncomplicated but the patient did have moderate gross hematuria from the nephrostomy tube. The patient's hemoglobin drop from 15.8  To 12.5 on postoperative day 1. He remained hemodynamically stable. We attempted to plug his nephrostomy tube but he developed severe flank pain and was quite uncomfortable on postoperative day 1. Foley catheter was able to be removed and he was able to void without difficulty. On postoperative day 2 he felt markedly better and remained stable. Hemoglobin was stable and he remained afebrile. Attempts to plug his nephrostomy tube however resulted in significant flank pain and therefore decision was made to leave his nephrostomy to gravity drainage and to discharge and with the nephrostomy tube with plans for follow-up imaging and removal as an outpatient.  Day of Discharge BP 120/68 mmHg  Pulse 96  Temp(Src) 99.1 F (37.3 C) (Oral)  Resp 20  Ht 5\' 10"  (1.778 m)  Wt 90.084 kg (198 lb 9.6 oz)  BMI 28.50 kg/m2  SpO2 95%   Well-developed well-nourished male in no acute distress Respiratory: Normal effort cardiac   Cardiac: Regular rate and rhythm Abdomen: Soft and nontender with nephrostomy tube draining moderately bloody urine Extremities: No tenderness or edema   Results for orders placed or performed during the hospital encounter  of 11/03/14 (from the past 24 hour(s))  Hemoglobin and hematocrit, blood     Status: Abnormal   Collection Time: 11/05/14  6:53 AM  Result Value Ref Range   Hemoglobin 12.2 (L) 13.0 - 17.0 g/dL   HCT 36.8 (L) 39.0 - 52.0 %

## 2014-11-05 NOTE — Progress Notes (Signed)
2 Days Post-Op Subjective: Patient reports feeling much better this morning. He did have moderate to severe discomfort most of the morning and afternoon yesterday. He does not feel like he could went home yesterday. His pain is much more manageable today. He is voiding well and his urine is at least blood tinge suggesting some urine from the right kidney is getting down to the bladder. Nephrostomy tube is draining bloody urine that is substantially better than yesterday. Hemoglobin stable.  Objective: Vital signs in last 24 hours: Temp:  [98.6 F (37 C)-99.8 F (37.7 C)] 99.1 F (37.3 C) (02/21 0436) Pulse Rate:  [88-118] 96 (02/21 0436) Resp:  [20] 20 (02/20 1328) BP: (120-165)/(68-82) 120/68 mmHg (02/21 0436) SpO2:  [95 %-99 %] 95 % (02/20 2118)  Intake/Output from previous day: 02/20 0701 - 02/21 0700 In: 3480 [P.O.:1080; I.V.:2400] Out: 7201 [Urine:7201] Intake/Output this shift:    Physical Exam:  Constitutional: Vital signs reviewed. WD WN in NAD   Eyes: PERRL, No scleral icterus.   Cardiovascular: RRR Pulmonary/Chest: Normal effort Abdominal: Soft. Non-tender, non-distended, bowel sounds are normal, no masses, organomegaly, or guarding present.  Genitourinary: Nephrostomy tube with light to medium red Extremities: No cyanosis or edema   Lab Results:  Recent Labs  11/03/14 0755 11/04/14 0549 11/05/14 0653  HGB 15.8 12.5* 12.2*  HCT 46.7 39.1 36.8*   BMET  Recent Labs  11/03/14 0755  NA 140  K 4.1  CL 108  CO2 26  GLUCOSE 127*  BUN 15  CREATININE 1.19  CALCIUM 9.2    Recent Labs  11/03/14 0755  INR 0.95   No results for input(s): LABURIN in the last 72 hours. Results for orders placed or performed during the hospital encounter of 10/27/14  Surgical pcr screen     Status: Abnormal   Collection Time: 10/27/14  9:57 AM  Result Value Ref Range Status   MRSA, PCR NEGATIVE NEGATIVE Final   Staphylococcus aureus POSITIVE (A) NEGATIVE Final    Comment:         The Xpert SA Assay (FDA approved for NASAL specimens in patients over 44 years of age), is one component of a comprehensive surveillance program.  Test performance has been validated by [facility] for patients greater than or equal to 29 year old. It is not intended to diagnose infection nor to guide or monitor treatment.     Studies/Results: Dg C-arm 1-60 Min-no Report  11/03/2014   CLINICAL DATA: renal stone   C-ARM 1-60 MINUTES  Fluoroscopy was utilized by the requesting physician.  No radiographic  interpretation.    Ir Ureteral Stent Right New Access W/o Sep Nephrostomy Cath  11/03/2014   CLINICAL DATA:  right nephrolithiasis. Preop percutaneous nephrolithotomy.  EXAM: RIGHT PERCUTANEOUS NEPHROURETERAL CATHETER PLACEMENT UNDER FLUOROSCOPIC GUIDANCE  FLUOROSCOPY TIME:  4 minutes 12 seconds  TECHNIQUE: The procedure, risks (including but not limited to bleeding, infection, organ damage ), benefits, and alternatives were explained to the patient. Questions regarding the procedure were encouraged and answered. The patient understands and consents to the procedure.  Rightflank region marked, then prepped with Betadine, draped in usual sterile fashion, infiltrated locally with 1% lidocaine.As antibiotic prophylaxis, cefazolin 2 g was ordered pre-procedure and administered intravenously within one hour of incision.  Intravenous Fentanyl and Versed were administered as conscious sedation during continuous cardiorespiratory monitoring by the radiology RN, with a total moderate sedation time of 19 minutes.  Under real-time fluoroscopic guidance, a 21-gauge trocar needle was advanced into a posterior  lower pole calyx using the calculus as a guide. A 018 needle advanced centrally, moving the calculi. Needle was exchanged over a guidewire for transitional dilator. Parallel angled 035 glidewire was advanced. The dilator was removed and a 5 [place] Kumpe catheter advanced into the renal collecting  system. Contrast injection confirmed appropriate positioning. Catheter was negotiated down the ureter and coiled in the urinary bladder. Spot radiograph confirms appropriate positioning and patency. Catheter secured externally. The patient tolerated the procedure well. Transferred to preop holding.  COMPLICATIONS: COMPLICATIONS none  IMPRESSION: 1. Technically successful antegrade right percutaneous nephroureteral catheter placement.   Electronically Signed   By: Lucrezia Europe M.D.   On: 11/03/2014 12:43    Assessment/Plan:   Better this a.m. He remains afebrile with stable hemoglobin. His pain is really much better today. We will attempt to plug his nephrostomy tube again this morning. If he does well after a couple hours we can remove the nephrostomy tube. If his pain worsens we will open the nephrostomy tube to drainage and probably send him home today with the nephrostomy tube to a drainage bag.     Walter Grima S 11/05/2014, 8:10 AM

## 2014-11-06 ENCOUNTER — Encounter (HOSPITAL_COMMUNITY): Payer: Self-pay | Admitting: Urology

## 2014-11-26 ENCOUNTER — Other Ambulatory Visit: Payer: Self-pay | Admitting: Internal Medicine

## 2014-11-28 NOTE — Telephone Encounter (Signed)
Refilled once.  WP out of the office.  Sent to the pharmacy by e-scribe.

## 2014-12-13 ENCOUNTER — Other Ambulatory Visit: Payer: Self-pay | Admitting: Internal Medicine

## 2014-12-13 NOTE — Telephone Encounter (Signed)
Sent to the pharmacy by e-scribe.  Pt has appt on 04/16/15 for CPX.

## 2015-01-11 ENCOUNTER — Other Ambulatory Visit: Payer: Self-pay | Admitting: Internal Medicine

## 2015-01-12 NOTE — Telephone Encounter (Signed)
Ok x 1

## 2015-01-12 NOTE — Telephone Encounter (Signed)
Sent to the pharmacy by e-scribe. 

## 2015-03-12 ENCOUNTER — Other Ambulatory Visit: Payer: Self-pay

## 2015-04-09 ENCOUNTER — Other Ambulatory Visit (INDEPENDENT_AMBULATORY_CARE_PROVIDER_SITE_OTHER): Payer: Federal, State, Local not specified - PPO

## 2015-04-09 DIAGNOSIS — E119 Type 2 diabetes mellitus without complications: Secondary | ICD-10-CM | POA: Diagnosis not present

## 2015-04-09 DIAGNOSIS — Z0000 Encounter for general adult medical examination without abnormal findings: Secondary | ICD-10-CM | POA: Diagnosis not present

## 2015-04-09 LAB — BASIC METABOLIC PANEL
BUN: 11 mg/dL (ref 6–23)
CO2: 29 mEq/L (ref 19–32)
Calcium: 9.1 mg/dL (ref 8.4–10.5)
Chloride: 105 mEq/L (ref 96–112)
Creatinine, Ser: 1.2 mg/dL (ref 0.40–1.50)
GFR: 67.17 mL/min (ref >60.00)
GLUCOSE: 121 mg/dL — AB (ref 70–99)
Potassium: 4.4 mEq/L (ref 3.5–5.1)
Sodium: 143 mEq/L (ref 135–145)

## 2015-04-09 LAB — CBC WITH DIFFERENTIAL/PLATELET
BASOS PCT: 0.3 % (ref 0.0–3.0)
Basophils Absolute: 0 10*3/uL (ref 0.0–0.1)
Eosinophils Absolute: 0.1 10*3/uL (ref 0.0–0.7)
Eosinophils Relative: 2.9 % (ref 0.0–5.0)
HCT: 45.8 % (ref 39.0–52.0)
Hemoglobin: 15.5 g/dL (ref 13.0–17.0)
Lymphocytes Relative: 28.6 % (ref 12.0–46.0)
Lymphs Abs: 1.4 10*3/uL (ref 0.7–4.0)
MCHC: 33.9 g/dL (ref 30.0–36.0)
MCV: 96.1 fl (ref 78.0–100.0)
Monocytes Absolute: 0.4 10*3/uL (ref 0.1–1.0)
Monocytes Relative: 7.8 % (ref 3.0–12.0)
NEUTROS ABS: 2.9 10*3/uL (ref 1.4–7.7)
Neutrophils Relative %: 60.4 % (ref 43.0–77.0)
PLATELETS: 150 10*3/uL (ref 150.0–400.0)
RBC: 4.77 Mil/uL (ref 4.22–5.81)
RDW: 14.1 % (ref 11.5–15.5)
WBC: 4.7 10*3/uL (ref 4.0–10.5)

## 2015-04-09 LAB — HEPATIC FUNCTION PANEL
ALK PHOS: 97 U/L (ref 39–117)
ALT: 12 U/L (ref 0–53)
AST: 16 U/L (ref 0–37)
Albumin: 4.3 g/dL (ref 3.5–5.2)
BILIRUBIN TOTAL: 0.4 mg/dL (ref 0.2–1.2)
Bilirubin, Direct: 0.1 mg/dL (ref 0.0–0.3)
Total Protein: 6.4 g/dL (ref 6.0–8.3)

## 2015-04-09 LAB — PSA: PSA: 0.76 ng/mL (ref 0.10–4.00)

## 2015-04-09 LAB — LIPID PANEL
Cholesterol: 164 mg/dL (ref 0–200)
HDL: 40.1 mg/dL (ref >39.00)
LDL Cholesterol: 93 mg/dL (ref 0–99)
NONHDL: 123.9
Total CHOL/HDL Ratio: 4
Triglycerides: 154 mg/dL — ABNORMAL HIGH (ref 0.0–149.0)
VLDL: 30.8 mg/dL (ref 0.0–40.0)

## 2015-04-09 LAB — TSH: TSH: 1.54 u[IU]/mL (ref 0.35–4.50)

## 2015-04-09 LAB — HEMOGLOBIN A1C: Hgb A1c MFr Bld: 5.5 % (ref 4.6–6.5)

## 2015-04-09 NOTE — Addendum Note (Signed)
Addended by: Elmer Picker on: 04/09/2015 07:57 AM   Modules accepted: Orders

## 2015-04-16 ENCOUNTER — Encounter: Payer: Federal, State, Local not specified - PPO | Admitting: Internal Medicine

## 2015-04-16 ENCOUNTER — Other Ambulatory Visit: Payer: Self-pay | Admitting: Internal Medicine

## 2015-04-18 ENCOUNTER — Other Ambulatory Visit: Payer: Self-pay | Admitting: Neurosurgery

## 2015-04-18 DIAGNOSIS — M5416 Radiculopathy, lumbar region: Secondary | ICD-10-CM

## 2015-04-18 NOTE — Telephone Encounter (Signed)
X 1  Address med at next visit  ichanged the sig to prn

## 2015-04-19 NOTE — Telephone Encounter (Signed)
Sent to the pharmacy by e-scribe. 

## 2015-05-09 ENCOUNTER — Emergency Department (HOSPITAL_COMMUNITY): Payer: Federal, State, Local not specified - PPO

## 2015-05-09 ENCOUNTER — Emergency Department (HOSPITAL_COMMUNITY)
Admission: EM | Admit: 2015-05-09 | Discharge: 2015-05-09 | Disposition: A | Discharge status: Home / Self Care | Payer: Federal, State, Local not specified - PPO | Attending: Emergency Medicine | Admitting: Emergency Medicine

## 2015-05-09 ENCOUNTER — Encounter (HOSPITAL_COMMUNITY): Payer: Self-pay | Admitting: *Deleted

## 2015-05-09 DIAGNOSIS — Z8679 Personal history of other diseases of the circulatory system: Secondary | ICD-10-CM | POA: Diagnosis not present

## 2015-05-09 DIAGNOSIS — Z87828 Personal history of other (healed) physical injury and trauma: Secondary | ICD-10-CM | POA: Diagnosis not present

## 2015-05-09 DIAGNOSIS — Z9884 Bariatric surgery status: Secondary | ICD-10-CM | POA: Diagnosis not present

## 2015-05-09 DIAGNOSIS — Z87442 Personal history of urinary calculi: Secondary | ICD-10-CM | POA: Insufficient documentation

## 2015-05-09 DIAGNOSIS — R410 Disorientation, unspecified: Secondary | ICD-10-CM | POA: Diagnosis not present

## 2015-05-09 DIAGNOSIS — Z8719 Personal history of other diseases of the digestive system: Secondary | ICD-10-CM | POA: Diagnosis not present

## 2015-05-09 DIAGNOSIS — F05 Delirium due to known physiological condition: Secondary | ICD-10-CM

## 2015-05-09 DIAGNOSIS — E119 Type 2 diabetes mellitus without complications: Secondary | ICD-10-CM | POA: Insufficient documentation

## 2015-05-09 DIAGNOSIS — R4182 Altered mental status, unspecified: Secondary | ICD-10-CM | POA: Diagnosis present

## 2015-05-09 DIAGNOSIS — Z8669 Personal history of other diseases of the nervous system and sense organs: Secondary | ICD-10-CM | POA: Insufficient documentation

## 2015-05-09 DIAGNOSIS — Z79899 Other long term (current) drug therapy: Secondary | ICD-10-CM | POA: Diagnosis not present

## 2015-05-09 DIAGNOSIS — R5383 Other fatigue: Secondary | ICD-10-CM | POA: Diagnosis not present

## 2015-05-09 LAB — CBC WITH DIFFERENTIAL/PLATELET
Basophils Absolute: 0 10*3/uL (ref 0.0–0.1)
Basophils Relative: 0 % (ref 0–1)
Eosinophils Absolute: 0 10*3/uL (ref 0.0–0.7)
Eosinophils Relative: 1 % (ref 0–5)
HCT: 46.9 % (ref 39.0–52.0)
Hemoglobin: 16 g/dL (ref 13.0–17.0)
Lymphocytes Relative: 10 % — ABNORMAL LOW (ref 12–46)
Lymphs Abs: 0.8 10*3/uL (ref 0.7–4.0)
MCH: 33.1 pg (ref 26.0–34.0)
MCHC: 34.1 g/dL (ref 30.0–36.0)
MCV: 96.9 fL (ref 78.0–100.0)
Monocytes Absolute: 0.1 10*3/uL (ref 0.1–1.0)
Monocytes Relative: 2 % — ABNORMAL LOW (ref 3–12)
Neutro Abs: 7 10*3/uL (ref 1.7–7.7)
Neutrophils Relative %: 87 % — ABNORMAL HIGH (ref 43–77)
Platelets: 173 10*3/uL (ref 150–400)
RBC: 4.84 MIL/uL (ref 4.22–5.81)
RDW: 13 % (ref 11.5–15.5)
WBC: 8 10*3/uL (ref 4.0–10.5)

## 2015-05-09 LAB — RAPID URINE DRUG SCREEN, HOSP PERFORMED
Amphetamines: NOT DETECTED
Barbiturates: NOT DETECTED
Benzodiazepines: NOT DETECTED
Cocaine: NOT DETECTED
Opiates: NOT DETECTED
Tetrahydrocannabinol: NOT DETECTED

## 2015-05-09 LAB — BASIC METABOLIC PANEL
Anion gap: 10 (ref 5–15)
BUN: 17 mg/dL (ref 6–20)
CO2: 23 mmol/L (ref 22–32)
Calcium: 9 mg/dL (ref 8.9–10.3)
Chloride: 104 mmol/L (ref 101–111)
Creatinine, Ser: 1.37 mg/dL — ABNORMAL HIGH (ref 0.61–1.24)
GFR calc Af Amer: >60 mL/min (ref >60)
GFR calc non Af Amer: 57 mL/min — ABNORMAL LOW (ref >60)
Glucose, Bld: 150 mg/dL — ABNORMAL HIGH (ref 65–99)
Potassium: 4.1 mmol/L (ref 3.5–5.1)
Sodium: 137 mmol/L (ref 135–145)

## 2015-05-09 LAB — URINALYSIS, ROUTINE W REFLEX MICROSCOPIC
Glucose, UA: NEGATIVE mg/dL
Hgb urine dipstick: NEGATIVE
Ketones, ur: NEGATIVE mg/dL
Leukocytes, UA: NEGATIVE
Nitrite: NEGATIVE
Protein, ur: NEGATIVE mg/dL
Specific Gravity, Urine: 1.025 (ref 1.005–1.030)
Urobilinogen, UA: 1 mg/dL (ref 0.0–1.0)
pH: 6.5 (ref 5.0–8.0)

## 2015-05-09 LAB — ETHANOL: Alcohol, Ethyl (B): <5 mg/dL (ref <5)

## 2015-05-09 LAB — I-STAT CG4 LACTIC ACID, ED: Lactic Acid, Venous: 1.3 mmol/L (ref 0.5–2.0)

## 2015-05-09 LAB — CBG MONITORING, ED: Glucose-Capillary: 143 mg/dL — ABNORMAL HIGH (ref 65–99)

## 2015-05-09 LAB — MAGNESIUM: Magnesium: 2.2 mg/dL (ref 1.7–2.4)

## 2015-05-09 MED ORDER — GADOBENATE DIMEGLUMINE 529 MG/ML IV SOLN
18.0000 mL | Freq: Once | INTRAVENOUS | Status: AC | PRN
  Administered 2015-05-09: 20 mL via INTRAVENOUS

## 2015-05-09 NOTE — Discharge Instructions (Signed)
Confusion Confusion is the inability to think with your usual speed or clarity. Confusion may come on quickly or slowly over time. How quickly the confusion comes on depends on the cause. Confusion can be due to any number of causes. CAUSES   Concussion, head injury, or head trauma.  Seizures.  Stroke.  Fever.  Brain tumor.  Age related decreased brain function (dementia).  Heightened emotional states like rage or terror.  Mental illness in which the person loses the ability to determine what is real and what is not (hallucinations).  Infections such as a urinary tract infection (UTI).  Toxic effects from alcohol, drugs, or prescription medicines.  Dehydration and an imbalance of salts in the body (electrolytes).  Lack of sleep.  Low blood sugar (diabetes).  Low levels of oxygen from conditions such as chronic lung disorders.  Drug interactions or other medicine side effects.  Nutritional deficiencies, especially niacin, thiamine, vitamin C, or vitamin B.  Sudden drop in body temperature (hypothermia).  Change in routine, such as when traveling or hospitalized. SIGNS AND SYMPTOMS  People often describe their thinking as cloudy or unclear when they are confused. Confusion can also include feeling disoriented. That means you are unaware of where or who you are. You may also not know what the date or time is. If confused, you may also have difficulty paying attention, remembering, and making decisions. Some people also act aggressively when they are confused.  DIAGNOSIS  The medical evaluation of confusion may include:  Blood and urine tests.  X-rays.  Brain and nervous system tests.  Analyzing your brain waves (electroencephalogram or EEG).  Magnetic resonance imaging (MRI) of your head.  Computed tomography (CT) scan of your head.  Mental status tests in which your health care provider may ask many questions. Some of these questions may seem silly or strange,  but they are a very important test to help diagnose and treat confusion. TREATMENT  An admission to the hospital may not be needed, but a person with confusion should not be left alone. Stay with a family member or friend until the confusion clears. Avoid alcohol, pain relievers, or sedative drugs until you have fully recovered. Do not drive until directed by your health care provider. HOME CARE INSTRUCTIONS  What family and friends can do:  To find out if someone is confused, ask the person to state his or her name, age, and the date. If the person is unsure or answers incorrectly, he or she is confused.  Always introduce yourself, no matter how well the person knows you.  Often remind the person of his or her location.  Place a calendar and clock near the confused person.  Help the person with his or her medicines. You may want to use a pill box, an alarm as a reminder, or give the person each dose as prescribed.  Talk about current events and plans for the day.  Try to keep the environment calm, quiet, and peaceful.  Make sure the person keeps follow-up visits with his or her health care provider. PREVENTION  Ways to prevent confusion:  Avoid alcohol.  Eat a balanced diet.  Get enough sleep.  Take medicine only as directed by your health care provider.  Do not become isolated. Spend time with other people and make plans for your days.  Keep careful watch on your blood sugar levels if you are diabetic. SEEK IMMEDIATE MEDICAL CARE IF:   You develop severe headaches, repeated vomiting, seizures, blackouts, or  slurred speech.  There is increasing confusion, weakness, numbness, restlessness, or personality changes.  You develop a loss of balance, have marked dizziness, feel uncoordinated, or fall.  You have delusions, hallucinations, or develop severe anxiety.  Your family members think you need to be rechecked. Document Released: 10/09/2004 Document Revised: 01/16/2014  Document Reviewed: 10/07/2013 [facility] Patient Information 2015 [place], [place]. This information is not intended to replace advice given to you by your health care provider. Make sure you discuss any questions you have with your health care provider.  Delirium Delirium (acute confusional state) is a sudden change in a person's brain function that causes the person to become confused for a short period of time. People with delirium often have trouble knowing where they are. Delirium comes on very fast. It can develop in a few days or just a few hours. Delirium usually occurs because of another mental or physical condition. For example, a person might develop delirium after a surgery. It is especially common in elderly people who are sick or in the hospital. People with dementia (a brain disease like Alzheimer's) or people who are near death may also develop delirium.  CAUSES  Delirium occurs when something affects the signals that the brain sends out. These signals can be affected by anything that puts stress on the body and brain, causing brain chemicals to be out of balance. Structural health problems such as acute strokes, bleeding near the brain (intracranial bleeds), and trauma can also cause delirium. Sometimes the exact cause of delirium is not known. Usually, several factors contribute to the development of delirium. Things that may cause a person to develop delirium include:  Surgery, especially when anesthetics are involved.  Chronic medical conditions, such as chronic lung, heart, or kidney disease.  Fever.  Low body temperature (hypothermia).  Infection, such as pneumonia, severe intestinal infection, severe skin infection, or urinary tract infection.  Poor nutrition that leads to very low vitamin or protein levels in the body (malnutrition).  Body fluid loss (dehydration).  Low blood sugar.  High blood sugar, which typically occurs in people with severe diabetes.  Electrolyte  abnormalities, such as sodium imbalance or acid-base disorders.  Low oxygen level.  Low blood pressure.  Uncontrolled high blood pressure.  Brain injury (trauma).  Stroke.  Abuse of alcohol or sudden withdrawal of alcohol.  Sudden tobacco withdrawal if the person is a longtime smoker.  Loss of vision or hearing.  Being strapped down (restrained).  Being in a new setting, such as an elderly person being admitted to a hospital.  Taking illegal drugs or quitting use of those drugs.  Taking certain medicines for pain, sleep, allergies, high blood pressure, anxiety, depression, Parkinson disease, or seizures.  Sundowning syndrome, which is a complication of chronic dementia that can occur in the later part of a person's wake cycle. SYMPTOMS  The main sign of delirium is a sudden change in a person's mental state. This change can come and go. Symptoms may include:  Not being able to pay attention.  Being confused about places, time, and people.  Seeing, hearing, or feeling things that are not real (hallucinations).  Changes in sleep patterns.  Being restless, hyperactive, irritable, and angry.  Extreme mood swings.  Rambling and senseless talking.  Difficulty speaking or understanding speech.  Memory loss.  Changes in consciousness, such as being sleepy, sluggish, lethargic, and withdrawn.  Focusing on things or ideas that are not important.  Unusual body movements or shaking (tremors). DIAGNOSIS  There  are no specific tests for delirium, but a health care provider may do the following:  Perform a mental status assessment.The health care provider will check for confusion and lack of awareness by talking with the person and asking questions.  Talk with the person's friends and family. A friend or family member will often need to tell the health care provider about the person's symptoms and medical history, including medicines taken or missed.  Perform a physical  exam. The health care provider will perform a physical exam to check for underlying conditions that may lead to delirium, such as dehydration, malnutrition, trauma, and infection. The exam may include checking for changes in vision, hearing, and the way the person moves (coordination and reflexes). The health care provider may order tests, such as:  Blood tests.  Urine tests.  Brain imaging, such as a CT scan or MRI scan.  X-rays (to look for lung problems or intestinal blockage). TREATMENT  Treatment will focus on the cause of the delirium. Delirium is a sign of another problem. If that problem can be found and treated, the delirium may go away. Full recovery can take several weeks. Keeping the person safe until the cause can be found and treated (supportive care) is often what is needed. In some cases, medicine may be prescribed to help keep the person calm. People with delirium should not be left alone because they may involuntarily harm themselves. HOME CARE INSTRUCTIONS  Supportive care is provided by the person living with, or caring for, the person with delirium. It involves keeping the person safe, encouraging healthy habits, and helping the person stay aware of his or her surroundings. Take these steps to help care for someone with delirium:  Make sure the person eats a healthy diet.  Make sure the person gets enough fluids. The person should drink enough fluids to keep urine clear or pale yellow.  Do not leave the person alone.  Keep the person on a regular schedule. Maintain regular times for meals, sleeping, and being active.  Keep the home as quiet and stress-free as possible. This is very important at night and will help improve the quality of the person's sleep.  Let lots of sunlight into the home during the day.  Avoid total darkness at night.  Help the person maintain good hygiene to avoid skin infections, bed sores, and pressure ulcers.  Do activities outside as often  as possible.  Take the person to see others whenever you can.  Make sure the person uses hearing aids and eyeglasses if needed.  Give frequent verbal reminders of the current time, location, and situation.  Use memory cues, such as clocks, calendars, and family photos.  Try to keep the person calm. Music or relaxation techniques may be helpful.  Always be on the lookout for symptoms of delirium.  Do not use restraints.  Only give over-the-counter or prescription medicines as directed by the health care provider.  Make sure the person takes all medicines on a regular schedule as directed by the health care provider.  Keep all follow-up appointments. SEEK MEDICAL CARE IF:  Any of the person's symptoms become worse.  New signs of delirium develop.  Caring for the person at home does not seem safe.  The person stops eating, drinking, or communicating.  The person develops vomiting. SEEK IMMEDIATE MEDICAL CARE IF:   Symptoms of delirium do not go away after treatment.  The person develops chest pain or shortness of breath.  The person seems  to want to harm someone or harm himself or herself. MAKE SURE YOU:   Understand these instructions.  Will watch the condition.  Will get help right away if the person is not doing well or gets worse. Document Released: 05/26/2012 Document Revised: 01/16/2014 Document Reviewed: 05/26/2012 [facility] Patient Information 2015 [place], [place]. This information is not intended to replace advice given to you by your health care provider. Make sure you discuss any questions you have with your health care provider.

## 2015-05-09 NOTE — ED Notes (Signed)
Patient transported to MRI 

## 2015-05-09 NOTE — ED Provider Notes (Signed)
CSN: [id number]     Arrival date & time 05/09/15  1709 History   First MD Initiated Contact with Patient 05/09/15 1743     Chief Complaint  Patient presents with  . Altered Mental Status  . Hallucinations  . Fatigue     (Consider location/radiation/quality/duration/timing/severity/associated sxs/prior Treatment) HPI   54 year old male with acute confusional state. Increasing tiredness since about Monday night. Also saying things that simply don't make sense. This is very unusual for him. No trauma. No fevers or chills. No medications include starting Cymbalta on Sunday. He is on others several psychoactive medications. These were continued. No new medications or supplements otherwise. Patient is complaining of feeling tired and agrees that he is little bit confused but is not sure why. He denies any acute pain. No acute visual changes.  Past Medical History  Diagnosis Date  . Hyperlipidemia   . Abnormal LFTs     improved after gastic bypass  . History of concussion   . Gout   . Perforated ulcer     Surgical repair9/24 2011  . B12 nutritional deficiency   . Closed head injury     laceration  9 12  no loc  vision change  . History of renal stone     Seen in emergency room not urologist  . Chest pain     hopsitalized 2 14 evaluation  . OBESITY 12/10/2007    Qualifier: Diagnosis of  By: [name] MD, Standley Brooking Gastric bypass   . 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   [name] GERD (gastroesophageal reflux disease)     no meds  . DDD (degenerative disc disease)   . 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 Sleep apnea     had gastric bypass/ sa-gone  . OBSTRUCTIVE SLEEP APNEA 07/22/2007    Qualifier: Diagnosis of  By: [name] RN, Doroteo Bradford    . OSA (obstructive sleep apnea) 12/12/2013  . Diabetes mellitus     resolved with gastric bypass  . DIABETES MELLITUS, TYPE II  04/06/2007    Qualifier: Diagnosis of  By: [name] MD, Standley Brooking Improved and off meds since gastric bypass   . History of bunionectomy of left great toe   . Anginal pain   . Anxiety   . Headache     once per week uses Indomethicin hx of cluster headaches    Past Surgical History  Procedure Laterality Date  . Vasectomy    . Nasal septoplasty w/ turbinoplasty      x 2  . Gastric bypass  11/06/09    beginning weight 267  . Epidural block injection      by Dr. Nelva Bush  . Surgerical repair of stomach ulcer  06/08/10    per  . Hiatal hernia repair N/A 01/20/2013    Procedure: LAPAROSCOPIC REPAIR OF INTERNAL HERNIA;  Surgeon: Shann Medal, MD;  Location: [facility];  Service: General;  Laterality: N/A;  . Back surgery  2012    Dr. Dorina Hoyer disk  . Tonsillectomy    . Uvulopalatopharyngoplasty  2007    with tonsils  . Shoulder arthroscopy  2014    rt  . Carpal tunnel release  2008    left-ulnar nerve decomp  . Mass excision Right 04/18/2013    Procedure: RIGHT SMALL FINGER SKIN LESION EXCISION;  Surgeon: Jolyn Nap, MD;  Location: [facility]  [place];  Service: Orthopedics;  Laterality: Right;  . Carpel tunnel Left   . Lumbar laminectomy/decompression microdiscectomy Left 06/07/2014    Procedure: MICRO LUMBAR DECOMPRESSION L5 - S1 ON THE LEFT/L5 FORAMINOTOMY/EXCISION SYNOVIAL CYST  1 LEVEL;  Surgeon: [name], MD;  Location: [facility];  Service: Orthopedics;  Laterality: Left;  . Colonscopy     . Hernia repair  0174    umbilical  . Foot surgery      bilat for removal of extra bone   . Nephrolithotomy Right 11/03/2014    Procedure: RIGHT PERCUTANEOUS NEPHROLITHOTOMY;  Surgeon: Claybon Jabs, MD;  Location: [facility];  Service: Urology;  Laterality: Right;   Family History  Problem Relation Age of Onset  . Uterine cancer Mother   . Hypertension Mother   . Cervical cancer Mother   . Heart disease Father   . COPD Father   . Colon cancer Maternal Uncle   . Colon cancer Paternal  Aunt    Social History  Substance Use Topics  . Smoking status: Never Smoker   . Smokeless tobacco: Never Used  . Alcohol Use: Yes     Comment: occas liquor     Review of Systems  All systems reviewed and negative, other than as noted in HPI.   Allergies  Aspirin and Hydrocodone  Home Medications   Prior to Admission medications   Medication Sig Start Date End Date Taking? Authorizing Provider  acetaminophen (TYLENOL) 500 MG tablet Take 1,000 mg by mouth every 6 (six) hours as needed for moderate pain.    Yes Historical Provider, MD  DULoxetine (CYMBALTA) 30 MG capsule Take 60 mg by mouth at bedtime.   Yes Historical Provider, MD  escitalopram (LEXAPRO) 20 MG tablet Take 20 mg by mouth every morning.    Yes Historical Provider, MD  lamoTRIgine (LAMICTAL) 100 MG tablet Take 100-200 mg by mouth 2 (two) times daily. Take 200 mg in the am and 100 mg qhs.   Yes Historical Provider, MD  oxyCODONE-acetaminophen (PERCOCET) 7.5-325 MG per tablet TAKE 1 TABLET BY MOUTH TWICE A DAY AS NEEDED FOR PAIN 05/02/15  Yes Historical Provider, MD  tamsulosin (FLOMAX) 0.4 MG CAPS Take 0.4 mg by mouth daily after breakfast. 03/02/13  Yes Tiffany Carlota Raspberry, PA-C  zolpidem (AMBIEN) 10 MG tablet Take 1 tablet (10 mg total) by mouth at bedtime as needed. For sleep Patient taking differently: Take 10 mg by mouth at bedtime. For sleep 06/24/13  Yes Burnis Medin, MD  amitriptyline (ELAVIL) 50 MG tablet TAKE 1 TABLET AS NEEDED FOR SLEEP Patient not taking: Reported on 05/09/2015 12/13/14   Burnis Medin, MD  indomethacin (INDOCIN) 25 MG capsule Take 1-2 capsules (25-50 mg total) by mouth 3 (three) times daily as needed. Patient not taking: Reported on 05/09/2015 04/19/15   Burnis Medin, MD  indomethacin (INDOCIN) 50 MG capsule TAKE 1 CAPSULE BY MOUTH 3 TIMES A DAY AS NEEDED Patient not taking: Reported on 05/09/2015 01/12/15   Burnis Medin, MD  ondansetron (ZOFRAN ODT) 8 MG disintegrating tablet 8mg  ODT q4 hours prn  nausea Patient not taking: Reported on 05/09/2015 07/14/14   Jarrett Soho Muthersbaugh, PA-C  ondansetron (ZOFRAN-ODT) 4 MG disintegrating tablet TAKE 1 TABLET (4 MG TOTAL) BY MOUTH EVERY 8 (EIGHT) HOURS AS NEEDED FOR NAUSEA. Patient not taking: Reported on 05/09/2015 11/28/14   Burnis Medin, MD  OxyCODONE (OXYCONTIN) 20 mg T12A 12 hr tablet Take 1 tablet (20 mg total) by mouth every  12 (twelve) hours. Patient not taking: Reported on 05/09/2015 11/03/14   Kathie Rhodes, MD  oxyCODONE-acetaminophen (PERCOCET/ROXICET) 5-325 MG per tablet Take 1-2 tablets by mouth every 4 (four) hours as needed for moderate pain or severe pain. Patient not taking: Reported on 05/09/2015 07/14/14   Jarrett Soho Muthersbaugh, PA-C  oxyCODONE-acetaminophen (ROXICET) 5-325 MG per tablet Take 1-2 tablets by mouth every 6 (six) hours as needed. Patient not taking: Reported on 05/09/2015 11/05/14   Rana Snare, MD   BP 117/88 mmHg  Pulse 102  Temp(Src) 98.3 F (36.8 C) (Oral)  Resp 20  SpO2 98% Physical Exam  Constitutional: He appears well-developed and well-nourished. No distress.  HENT:  Head: Normocephalic and atraumatic.  Eyes: Conjunctivae are normal. Right eye exhibits no discharge. Left eye exhibits no discharge.  Neck: Neck supple.  Cardiovascular: Normal rate, regular rhythm and normal heart sounds.  Exam reveals no gallop and no friction rub.   No murmur heard. Pulmonary/Chest: Effort normal and breath sounds normal. No respiratory distress.  Abdominal: Soft. He exhibits no distension. There is no tenderness.  Musculoskeletal: He exhibits no edema or tenderness.  Neurological: He is alert.  Speech is somewhat slowed, but understandable. Some answers to questions were appropriate and patient seems easily distracted. His cranial nerves are intact. Strength is 5 out of 5 bilateral upper and lower extremities. Sensation is intact to light touch. There is no obvious nystagmus. No inducible clonus. Muscle tone seems normal.  Biceps and patellar reflexes seem normal.  Skin: Skin is warm and dry.  Psychiatric: He has a normal mood and affect. His behavior is normal. Thought content normal.  Nursing note and vitals reviewed.   ED Course  Procedures (including critical care time) Labs Review Labs Reviewed  CBG MONITORING, ED - Abnormal; Notable for the following:    Glucose-Capillary 143 (*)    All other components within normal limits    Imaging Review No results found.   Ct Head Wo Contrast  05/09/2015   CLINICAL DATA:  Altered mental status. Increasing lethargy since Monday night. Hallucinations beginning today.  EXAM: CT HEAD WITHOUT CONTRAST  TECHNIQUE: Contiguous axial images were obtained from the base of the skull through the vertex without intravenous contrast.  COMPARISON:  06/07/2011  FINDINGS: Ventricles and sulci appear symmetrical. There is and 8 mm diameter cystic lesion arising left of midline at the level of the third ventricle, possibly in the anterior thalamus. This is enlarging since previous study. Suggest further evaluation with MRI for additional characterization. No mass effect or midline shift. No abnormal extra-axial fluid collections. Gray-white matter junctions are distinct. Basal cisterns are not effaced. No evidence of acute intracranial hemorrhage. No depressed skull fractures. Retention cysts in the right maxillary antrum. Mastoid air cells are not opacified.  IMPRESSION: No acute intracranial hemorrhage or mass effect. 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 Lesion has enlarged since previous study. Suggest MRI for further characterization.   Electronically Signed   By: Lucienne Capers M.D.   On: 05/09/2015 19:53   Mr Jeri Cos UY Contrast  05/09/2015   CLINICAL DATA:  Worsening lethargy since Monday. Hallucinations beginning today. Abnormal head CT.  EXAM: MRI HEAD WITHOUT AND WITH CONTRAST  TECHNIQUE: Multiplanar, multiecho pulse sequences of the brain  and surrounding structures were obtained without and with intravenous contrast.  CONTRAST:  98mL MULTIHANCE GADOBENATE DIMEGLUMINE 529 MG/ML IV SOLN  COMPARISON:  Head CT same day and 06/07/2011  FINDINGS: Diffusion imaging does not show  any acute or subacute infarction. The brainstem and cerebellum are normal. The cerebral hemispheres do not show any evidence of atrophy or old small or large vessel infarction. There is a 7 mm cyst at the inferior basal ganglia on the left which is known to have enlarged when compared to the study of 2012. The MR scan does not show any abnormality of the surrounding brain tissue or any contrast enhancement of the wall or surrounding brain tissue. Therefore, this is either enlarging perivascular space or an incidental neural glial cyst. No sign that of represents a neoplastic cyst. Elsewhere the brain does not show any evidence of mass, hemorrhage, hydrocephalus or extra-axial collection. No pituitary mass. No significant sinus disease. No abnormal contrast enhancement of the brain or leptomeninges.  IMPRESSION: No acute or significant brain finding. 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 MR characteristics are that of a benign cyst, not likely of any clinical relevance. This could be an enlarging perivascular space or a neural glial cyst.   Electronically Signed   By: Nelson Chimes M.D.   On: 05/09/2015 21:27   I have personally reviewed and evaluated these images and lab results as part of my medical decision-making.   EKG Interpretation   Date/Time:  Wednesday May 09 2015 17:19:14 EDT Ventricular Rate:  99 PR Interval:  414 QRS Duration: 131 QT Interval:  399 QTC Calculation: 512 R Axis:   -123 Text Interpretation:  Sinus rhythm Prolonged PR interval Nonspecific  intraventricular conduction delay Repol abnrm, severe global ischemia  (LM/MVD)  SINCE LAST TRACING HEART RATE HAS INCREASED Confirmed by  Winfred Leeds  MD, SAM [phone]) on 05/09/2015 5:21:20 PM      MDM   Final diagnoses:  Acute confusional state    54 year old male with an acute confusional state. I really suspect that this is medication related. Patient is on several psychoactive medications with one recently been started prior to symptom onset. Consider serotonin anergic access but does not fit criteria for serotonin syndrome. Patient was extensively evaluated in the emergency room with no significant abnormalities. Over the course of his ER stay his symptoms slowly improved back to his baseline. No further complaints. Advised to stop new medication follow PCP for further evaluation.     Virgel Manifold, MD 05/17/15 [phone]

## 2015-05-09 NOTE — ED Notes (Addendum)
Pt's wife states the pt has been increasingly lethargic since Monday night. Pt's wife states the pt also began having hallucinations today at 1PM. Pt started a new prescription of Cymbalta on Sunday, which he stopped taking at [facility] yesterday. Pt appears lethargic in triage. Pt is alert and oriented to person, place, and time.

## 2015-05-14 ENCOUNTER — Ambulatory Visit (INDEPENDENT_AMBULATORY_CARE_PROVIDER_SITE_OTHER): Payer: Federal, State, Local not specified - PPO | Admitting: Internal Medicine

## 2015-05-14 ENCOUNTER — Encounter: Payer: Self-pay | Admitting: Internal Medicine

## 2015-05-14 VITALS — BP 150/100 | Temp 98.3°F | Ht 69.0 in | Wt 182.3 lb

## 2015-05-14 DIAGNOSIS — Z9884 Bariatric surgery status: Secondary | ICD-10-CM | POA: Diagnosis not present

## 2015-05-14 DIAGNOSIS — R030 Elevated blood-pressure reading, without diagnosis of hypertension: Secondary | ICD-10-CM | POA: Diagnosis not present

## 2015-05-14 DIAGNOSIS — E119 Type 2 diabetes mellitus without complications: Secondary | ICD-10-CM

## 2015-05-14 DIAGNOSIS — Z23 Encounter for immunization: Secondary | ICD-10-CM

## 2015-05-14 DIAGNOSIS — Z79899 Other long term (current) drug therapy: Secondary | ICD-10-CM

## 2015-05-14 DIAGNOSIS — IMO0001 Reserved for inherently not codable concepts without codable children: Secondary | ICD-10-CM

## 2015-05-14 DIAGNOSIS — Z0000 Encounter for general adult medical examination without abnormal findings: Secondary | ICD-10-CM

## 2015-05-14 NOTE — Progress Notes (Signed)
Pre visit review using our clinic review tool, if applicable. No additional management support is needed unless otherwise documented below in the visit note.   Chief Complaint  Patient presents with  . Annual Exam    post ed visit     HPI: Patient  Hector [name]  54 y.o. comes in today for [place] visit   recenetly in ed for hallucination altered med status felt from new med    cymbalta at first dose  Says he took incorrectly in transfer from [place] . Now find to fu with .Psychiatry this next week  . Feel back to normal now . Had mri opf brain neg tox screen.  training fro triathalon   Back problematic and seeing dr Maxie Better and to get opinion from dr Alric Seton   Otherwise fine. To get myelogram Friday   No recent bp readings a t home   Up at medvisit  No go issues now  Caution with med that can cause gi bleed.   Had used indocin in past for cluster headaches  And rare use now    osa better after weight loss.   Lorrin Mais only as needed Health Maintenance  Topic Date Due  . Hepatitis C Screening  Jun 14, 1961  . PNEUMOCOCCAL POLYSACCHARIDE VACCINE (1) 09/19/1963  . HIV Screening  09/18/1976  . OPHTHALMOLOGY EXAM  10/16/2010  . FOOT EXAM  12/27/2013  . URINE MICROALBUMIN  05/16/2015  . HEMOGLOBIN A1C  10/10/2015  . INFLUENZA VACCINE  04/15/2016  . TETANUS/TDAP  06/06/2021  . COLONOSCOPY  05/08/2024   Health Maintenance Review LIFESTYLE:  Exercise:   Training  Exercise triathalon  Run if possible  Tobacco/ETS:no Alcohol: per day  Sugar beverages:no Sleep:ok  Drug use: no  ROS:  GEN/ HEENT: No fever, significant weight changes sweats headaches vision problems hearing changes, CV/ PULM; No chest pain shortness of breath cough, syncope,edema  change in exercise tolerance. GI /GU: No adominal pain, vomiting, change in bowel habits. No blood in the stool. No significant GU symptoms. SKIN/HEME: ,no acute skin rashes suspicious lesions or bleeding. No lymphadenopathy,  nodules, masses.  NEURO/ PSYCH:  No neurologic signs such as weakness numbness. No depression anxiety. IMM/ Allergy: No unusual infections.  Allergy .   REST of 12 system review negative except as per HPI   Past Medical History  Diagnosis Date  . Hyperlipidemia   . Abnormal LFTs     improved after gastic bypass  . History of concussion   . Gout   . Perforated ulcer     Surgical repair9/24 2011  . B12 nutritional deficiency   . Closed head injury     laceration  9 12  no loc  vision change  . History of renal stone     Seen in emergency room not urologist  . Chest pain     hopsitalized 2 14 evaluation  . OBESITY 12/10/2007    Qualifier: Diagnosis of  By: [name] MD, Standley Brooking Gastric bypass   . 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   [name] GERD (gastroesophageal reflux disease)     no meds  . DDD (degenerative disc disease)   . 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 Sleep apnea     had gastric bypass/ sa-gone  . OBSTRUCTIVE SLEEP APNEA 07/22/2007    Qualifier: Diagnosis of  By: [name]  RN, Doroteo Bradford    . OSA (obstructive sleep apnea) 12/12/2013  . Diabetes mellitus     resolved with gastric bypass  . DIABETES MELLITUS, TYPE II 04/06/2007    Qualifier: Diagnosis of  By: [name] MD, Standley Brooking Improved and off meds since gastric bypass   . History of bunionectomy of left great toe   . Anginal pain   . Anxiety   . Headache     once per week uses Indomethicin hx of cluster headaches     Past Surgical History  Procedure Laterality Date  . Vasectomy    . Nasal septoplasty w/ turbinoplasty      x 2  . Gastric bypass  11/06/09    beginning weight 267  . Epidural block injection      by Dr. Nelva Bush  . Surgerical repair of stomach ulcer  06/08/10    per  . Hiatal hernia repair N/A 01/20/2013    Procedure: LAPAROSCOPIC REPAIR OF INTERNAL HERNIA;  Surgeon: Shann Medal,  MD;  Location: [facility];  Service: General;  Laterality: N/A;  . Back surgery  2012    Dr. Dorina Hoyer disk  . Tonsillectomy    . Uvulopalatopharyngoplasty  2007    with tonsils  . Shoulder arthroscopy  2014    rt  . Carpal tunnel release  2008    left-ulnar nerve decomp  . Mass excision Right 04/18/2013    Procedure: RIGHT SMALL FINGER SKIN LESION EXCISION;  Surgeon: Jolyn Nap, MD;  Location: [place];  Service: Orthopedics;  Laterality: Right;  . Carpel tunnel Left   . Lumbar laminectomy/decompression microdiscectomy Left 06/07/2014    Procedure: MICRO LUMBAR DECOMPRESSION L5 - S1 ON THE LEFT/L5 FORAMINOTOMY/EXCISION SYNOVIAL CYST  1 LEVEL;  Surgeon: [name], MD;  Location: [facility];  Service: Orthopedics;  Laterality: Left;  . Colonscopy     . Hernia repair  3299    umbilical  . Foot surgery      bilat for removal of extra bone   . Nephrolithotomy Right 11/03/2014    Procedure: RIGHT PERCUTANEOUS NEPHROLITHOTOMY;  Surgeon: Claybon Jabs, MD;  Location: [facility];  Service: Urology;  Laterality: Right;    Family History  Problem Relation Age of Onset  . Uterine cancer Mother   . Hypertension Mother   . Cervical cancer Mother   . Heart disease Father   . COPD Father   . Colon cancer Maternal Uncle   . Colon cancer Paternal Aunt     Social History   Social History  . Marital Status: Married    Spouse Name: [name]  . Number of Children: 2  . Years of Education: Associates   Occupational History  . RADAR TECH    Social History Main Topics  . Smoking status: Never Smoker   . Smokeless tobacco: Never Used  . Alcohol Use: Yes     Comment: occas liquor   . Drug Use: No  . Sexual Activity: Yes   Other Topics Concern  . 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   Patient has two children.   Patient has a college education.   Regular exercise-no   Employed Event organiser and investigation    school travels       10 hours work     Sleep  No longer needs  cpap   Patient is right-handed.   Patient drinks one soda daily.  trainiing for triathalon          Outpatient Prescriptions Prior to Visit  Medication Sig Dispense Refill  . acetaminophen (TYLENOL) 500 MG tablet Take 1,000 mg by mouth every 6 (six) hours as needed for moderate pain.     [name] amitriptyline (ELAVIL) 50 MG tablet TAKE 1 TABLET AS NEEDED FOR SLEEP 90 tablet 1  . escitalopram (LEXAPRO) 20 MG tablet Take 20 mg by mouth every morning.     . indomethacin (INDOCIN) 50 MG capsule TAKE 1 CAPSULE BY MOUTH 3 TIMES A DAY AS NEEDED 40 capsule 0  . lamoTRIgine (LAMICTAL) 100 MG tablet Take 100-200 mg by mouth 2 (two) times daily. Take 200 mg in the am and 100 mg qhs.    . ondansetron (ZOFRAN ODT) 8 MG disintegrating tablet 8mg  ODT q4 hours prn nausea 10 tablet 0  . oxyCODONE-acetaminophen (PERCOCET) 7.5-325 MG per tablet TAKE 1 TABLET BY MOUTH TWICE A DAY AS NEEDED FOR PAIN  0  . tamsulosin (FLOMAX) 0.4 MG CAPS Take 0.4 mg by mouth daily after breakfast.    . zolpidem (AMBIEN) 10 MG tablet Take 1 tablet (10 mg total) by mouth at bedtime as needed. For sleep (Patient taking differently: Take 15 mg by mouth at bedtime. For sleep) 14 tablet 0  . indomethacin (INDOCIN) 25 MG capsule Take 1-2 capsules (25-50 mg total) by mouth 3 (three) times daily as needed. (Patient not taking: Reported on 05/09/2015) 80 capsule 0  . ondansetron (ZOFRAN-ODT) 4 MG disintegrating tablet TAKE 1 TABLET (4 MG TOTAL) BY MOUTH EVERY 8 (EIGHT) HOURS AS NEEDED FOR NAUSEA. (Patient not taking: Reported on 05/09/2015) 20 tablet 0  . OxyCODONE (OXYCONTIN) 20 mg T12A 12 hr tablet Take 1 tablet (20 mg total) by mouth every 12 (twelve) hours. (Patient not taking: Reported on 05/09/2015) 30 tablet 0  . oxyCODONE-acetaminophen (PERCOCET/ROXICET) 5-325 MG per tablet Take 1-2 tablets by mouth every 4 (four) hours as needed for moderate pain or severe pain. (Patient not  taking: Reported on 05/09/2015) 25 tablet 0  . oxyCODONE-acetaminophen (ROXICET) 5-325 MG per tablet Take 1-2 tablets by mouth every 6 (six) hours as needed. (Patient not taking: Reported on 05/09/2015) 30 tablet 0   No facility-administered medications prior to visit.     EXAM:  BP 150/100 mmHg  Temp(Src) 98.3 F (36.8 C) (Oral)  Ht 5\' 9"  (1.753 m)  Wt 182 lb 4.8 oz (82.691 kg)  BMI 26.91 kg/m2  Body mass index is 26.91 kg/(m^2).  Physical Exam: Vital signs reviewed IDP:OEUM is a well-developed well-nourished alert cooperative    who appearsr stated age in no acute distress.  HEENT: normocephalic atraumatic , Eyes: PERRL EOM's full, conjunctiva clear, Nares: paten,t no deformity discharge or tenderness., Ears: no deformity EAC's clear TMs with normal landmarks. Mouth: clear OP, no lesions, edema.  Moist mucous membranes. Dentition in adequate repair. NECK: supple without masses, thyromegaly or bruits. CHEST/PULM:  Clear to auscultation and percussion breath sounds equal no wheeze , rales or rhonchi. No chest wall deformities or tenderness. CV: PMI is nondisplaced, S1 S2 no gallops, murmurs, rubs. Peripheral pulses are full without delay.No JVD .  ABDOMEN: Bowel sounds normal nontender  No guard or rebound, no hepato splenomegal no CVA tenderness.  No hernia. Extremtities:  No clubbing cyanosis or edema, no acute joint swelling or redness no focal atrophy NEURO:  Oriented x3, cranial nerves 3-12 appear to be intact, no obvious focal weakness,gait within normal limits no abnormal reflexes or asymmetrical foot  exam nl  SKIN: No acute rashes normal turgor, color, no bruising or petechiae. scalin nose  No acute lesions PSYCH: Oriented, good eye contact, no obvious depression anxiety, cognition and judgment appear normal. LN: no cervical axillary inguinal adenopathy. Diabetic Foot Exam - Simple   Simple Foot Form  Diabetic Foot exam was performed with the following findings:  Yes 05/14/2015   3:10 PM  Visual Inspection  No deformities, no ulcerations, no other skin breakdown bilaterally:  Yes  Sensation Testing  Intact to touch and monofilament testing bilaterally:  Yes  Pulse Check  Posterior Tibialis and Dorsalis pulse intact bilaterally:  Yes  Comments        Labs reviewed   ASSESSMENT AND PLAN:  Discussed the following assessment and plan:  Visit for preventive health examination  Elevated BP - need confirmation of  readings rx as  indicated   Bariatric surgery status  Diabetes mellitus without complication in remission  Encounter for immunization  Medication management  Patient Care Team: Burnis Medin, MD as PCP - General Susa Day, MD (Orthopedic Surgery) Annia Belt, MD as Consulting Physician (Hematology and Oncology) Milagros Evener, NP as Nurse Practitioner Rana Snare, MD as Consulting Physician (Urology) Patient Instructions  Blood tests are good. triglyceridse could be slightly lower  Caution with the indocin if needed, .  Take blood pressure readings twice a day for 10 -14days and then periodically .To ensure below 140/90   .Send in readings     My chart or come in to discuss if elevated.      Standley Brooking. Panosh M.D.   Lab Results  Component Value Date   WBC 8.0 05/09/2015   HGB 16.0 05/09/2015   HCT 46.9 05/09/2015   PLT 173 05/09/2015   GLUCOSE 150* 05/09/2015   CHOL 164 04/09/2015   TRIG 154.0* 04/09/2015   HDL 40.10 04/09/2015   LDLDIRECT 113.2 04/01/2007   LDLCALC 93 04/09/2015   ALT 12 04/09/2015   AST 16 04/09/2015   NA 137 05/09/2015   K 4.1 05/09/2015   CL 104 05/09/2015   CREATININE 1.37* 05/09/2015   BUN 17 05/09/2015   CO2 23 05/09/2015   TSH 1.54 04/09/2015   PSA 0.76 04/09/2015   INR 0.95 11/03/2014   HGBA1C 5.5 04/09/2015   MICROALBUR 0.6 05/15/2014    After pt left noted inc cr from ed visit poss med effect had been in normal range    Will follow including bp  Control as needed

## 2015-05-14 NOTE — Patient Instructions (Signed)
Blood tests are good. triglyceridse could be slightly lower  Caution with the indocin if needed, .  Take blood pressure readings twice a day for 10 -14days and then periodically .To ensure below 140/90   .Send in readings     My chart or come in to discuss if elevated.

## 2015-05-18 ENCOUNTER — Ambulatory Visit
Admission: RE | Admit: 2015-05-18 | Discharge: 2015-05-18 | Disposition: A | Discharge status: Home / Self Care | Payer: Federal, State, Local not specified - PPO | Source: Ambulatory Visit | Attending: Neurosurgery | Admitting: Neurosurgery

## 2015-05-18 VITALS — BP 146/91 | HR 73 | Ht 70.0 in

## 2015-05-18 DIAGNOSIS — M5416 Radiculopathy, lumbar region: Secondary | ICD-10-CM

## 2015-05-18 DIAGNOSIS — M48062 Spinal stenosis, lumbar region with neurogenic claudication: Secondary | ICD-10-CM

## 2015-05-18 MED ORDER — HYDROMORPHONE HCL 2 MG PO TABS
2.0000 mg | ORAL_TABLET | Freq: Once | ORAL | Status: AC
  Administered 2015-05-18: 2 mg via ORAL

## 2015-05-18 MED ORDER — DIAZEPAM 5 MG PO TABS
10.0000 mg | ORAL_TABLET | Freq: Once | ORAL | Status: AC
  Administered 2015-05-18: 10 mg via ORAL

## 2015-05-18 MED ORDER — IOHEXOL 180 MG/ML  SOLN
15.0000 mL | Freq: Once | INTRAMUSCULAR | Status: DC | PRN
  Administered 2015-05-18: 15 mL via INTRATHECAL

## 2015-05-18 NOTE — Progress Notes (Signed)
Patient states he has been off Amitriptyline and Escitalopram for at least the days.  JKL

## 2015-05-18 NOTE — Discharge Instructions (Signed)
Myelogram Discharge Instructions  1. Go home and rest quietly for the next 24 hours.  It is important to lie flat for the next 24 hours.  Get up only to go to the restroom.  You may lie in the bed or on a couch on your back, your stomach, your left side or your right side.  You may have one pillow under your head.  You may have pillows between your knees while you are on your side or under your knees while you are on your back.  2. DO NOT drive today.  Recline the seat as far back as it will go, while still wearing your seat belt, on the way home.  3. You may get up to go to the bathroom as needed.  You may sit up for 10 minutes to eat.  You may resume your normal diet and medications unless otherwise indicated.  Drink lots of extra fluids today and tomorrow.  4. The incidence of headache, nausea, or vomiting is about 5% (one in 20 patients).  If you develop a headache, lie flat and drink plenty of fluids until the headache goes away.  Caffeinated beverages may be helpful.  If you develop severe nausea and vomiting or a headache that does not go away with flat bed rest, call [phone].  5. You may resume normal activities after your 24 hours of bed rest is over; however, do not exert yourself strongly or do any heavy lifting tomorrow. If when you get up you have a headache when standing, go back to bed and force fluids for another 24 hours.  6. Call your physician for a follow-up appointment.  The results of your myelogram will be sent directly to your physician by the following day.  7. If you have any questions or if complications develop after you arrive home, please call [phone].  Discharge instructions have been explained to the patient.  The patient, or the person responsible for the patient, fully understands these instructions.       May resume Amitriptyline and Escitalopram on Sept. 3, 2016, after 9:30 am.

## 2015-07-18 ENCOUNTER — Other Ambulatory Visit: Payer: Self-pay | Admitting: Internal Medicine

## 2015-10-04 ENCOUNTER — Other Ambulatory Visit: Payer: Self-pay | Admitting: Internal Medicine

## 2015-10-04 NOTE — Telephone Encounter (Signed)
Sent to the pharmacy by e-scribe.  Last filled 12/2014.

## 2015-10-25 ENCOUNTER — Telehealth: Payer: Self-pay | Admitting: Internal Medicine

## 2015-10-25 NOTE — Telephone Encounter (Signed)
Increase to 100 mg hs for 2-3 weeks   Then can increase to  150 mg  If needed  Need ov  In a month  Send in 50 mg  With above sig  And enough for a month.

## 2015-10-25 NOTE — Telephone Encounter (Signed)
Pt would like to increase amitriptyline 50 mg to 150 mg. If approve please send new rx into [facility] [address].Pt is going out of town on Sunday for a week. Pt has been on this med for awhile and feels like it is not as effective as it once was.

## 2015-10-26 MED ORDER — AMITRIPTYLINE HCL 50 MG PO TABS: ORAL_TABLET | ORAL | Status: DC

## 2015-10-26 NOTE — Telephone Encounter (Signed)
New directions sent to the pharmacy.  Left a message for pt to pick up at the pharmacy and notified of new sig.

## 2015-11-19 ENCOUNTER — Other Ambulatory Visit: Payer: Self-pay | Admitting: Internal Medicine

## 2015-11-20 ENCOUNTER — Telehealth: Payer: Self-pay | Admitting: Family Medicine

## 2015-11-20 NOTE — Telephone Encounter (Signed)
Sent to the pharmacy by e-scribe.  Message sent to scheduling. 

## 2015-11-20 NOTE — Telephone Encounter (Signed)
Supposed to come back in   After below  Phone note Ok to refill for one month worth    ROV before runs out for med check    Hulda Humphrey, CMA at 10/26/2015 10:52 AM     Status: Signed       Expand All Collapse All   New directions sent to the pharmacy. Left a message for pt to pick up at the pharmacy and notified of new sig.            Burnis Medin, MD at 10/25/2015 12:47 PM     Status: Signed       Expand All Collapse All   Increase to 100 mg hs for 2-3 weeks Then can increase to 150 mg If needed  Need ov In a month  Send in 50 mg With above sig And enough for a month.             Lennox Solders at 10/25/2015 12:39 PM     Status: Signed       Expand All Collapse All   Pt would like to increase amitriptyline 50 mg to 150 mg. If approve please send new rx [facility] [address].Pt is going out of town on Sunday for a week. Pt has been on this med for awhile and feels like it is not as effective as it once was.

## 2015-11-20 NOTE — Telephone Encounter (Signed)
Pt has been sch 01/07/16

## 2015-11-20 NOTE — Telephone Encounter (Signed)
Per WP, pt need medication follow up within the next 30 days.  Please help him to make this appointment.

## 2015-11-26 DIAGNOSIS — J3089 Other allergic rhinitis: Secondary | ICD-10-CM | POA: Insufficient documentation

## 2015-11-26 DIAGNOSIS — J3489 Other specified disorders of nose and nasal sinuses: Secondary | ICD-10-CM | POA: Insufficient documentation

## 2016-01-03 ENCOUNTER — Other Ambulatory Visit: Payer: Self-pay | Admitting: Internal Medicine

## 2016-01-03 NOTE — Telephone Encounter (Signed)
Sent to the pharmacy by e-scribe.  Pt has upcoming appt on 01/07/16

## 2016-01-04 NOTE — Progress Notes (Signed)
Pre visit review using our clinic review tool, if applicable. No additional management support is needed unless otherwise documented below in the visit note.  Chief Complaint  Patient presents with  . Follow-up    meds "dm in remission" has    HPI: Hector [name] 55 y.o.  comesin for medical management  meds etc  He i ssp bypass surgery  Diabetes in remission as well as fatty liver with over all metabolic improvement  Since surgery . Hx of renal stones and  Perforated ulcer  osa but ahs been doing well at this tie  Ha once a month .Marland KitchenMarland KitchenNo major change in health status since last visit . Days triggers  Travel. Not a lot of indocin is aware of   Gi risk of meds  Migraines prn Sleep  Average   300 trazadone  7-8 hours  Not using ambien at this time cause of known risk and se issues thinks memory is off but not alarming    No getting lost  inability to calculate etc  bp has been good  Gi no more active issues at present . ROS: See pertinent positives and negatives per HPI. No cp sob mood stab;e  Some sleep deprivation and some inc in weight  No neuropathy from dm travels  For contract work fro [employer]  And plans to stop   Past Medical History  Diagnosis Date  . Hyperlipidemia   . Abnormal LFTs     improved after gastic bypass  . History of concussion   . Gout   . Perforated ulcer ([place])     Surgical repair9/24 2011  . B12 nutritional deficiency   . Closed head injury     laceration  9 12  no loc  vision change  . History of renal stone     Seen in emergency room not urologist  . Chest pain     hopsitalized 2 14 evaluation  . OBESITY 12/10/2007    Qualifier: Diagnosis of  By: [name] MD, Standley Brooking Gastric bypass   . 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   [name] GERD (gastroesophageal reflux disease)     no meds  . DDD (degenerative disc disease)   . 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 Sleep apnea     had gastric bypass/ sa-gone  . OBSTRUCTIVE SLEEP APNEA 07/22/2007    Qualifier: Diagnosis of  By: [name] RN, Doroteo Bradford    . OSA (obstructive sleep apnea) 12/12/2013  . History of bunionectomy of left great toe   . Anginal pain ([place])   . Anxiety   . Headache     once per week uses Indomethicin hx of cluster headaches   . Diabetes mellitus     resolved with gastric bypass  . DIABETES MELLITUS, TYPE II 04/06/2007    Qualifier: Diagnosis of  By: [name] MD, Standley Brooking Improved and off meds since gastric bypass     Family History  Problem Relation Age of Onset  . Uterine cancer Mother   . Hypertension Mother   . Cervical cancer Mother   . Heart disease Father   . COPD Father   . Colon cancer Maternal Uncle   . Colon cancer Paternal Aunt     Social History   Social History  . Marital Status: Married    Spouse Name: [name]  . Number  of Children: 2  . Years of Education: Associates   Occupational History  . RADAR TECH    Social History Main Topics  . Smoking status: Never Smoker   . Smokeless tobacco: Never Used  . Alcohol Use: Yes     Comment: occas liquor   . Drug Use: No  . Sexual Activity: Yes   Other Topics Concern  . 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   Patient has two children.   Patient has a college education.   Regular exercise-no   Employed Event organiser and investigation   school travels       10 hours work     Sleep  No longer needs  cpap   Patient is right-handed.   Patient drinks one soda daily.   trainiing for triathalon          Outpatient Prescriptions Prior to Visit  Medication Sig Dispense Refill  . escitalopram (LEXAPRO) 20 MG tablet Take 20 mg by mouth every morning.     . indomethacin (INDOCIN) 50 MG capsule TAKE 1 CAPSULE BY MOUTH 3 TIMES A DAY AS NEEDED 40 capsule 0  . lamoTRIgine (LAMICTAL) 100 MG tablet Take 100-200 mg by mouth 2 (two) times  daily. Take 200 mg in the am and 100 mg qhs.    . ondansetron (ZOFRAN ODT) 8 MG disintegrating tablet 8mg  ODT q4 hours prn nausea 10 tablet 0  . amitriptyline (ELAVIL) 50 MG tablet TAKE 2TABS AT NIGHT FOR 2 TO 3 WEEKS AND INCREASE TO 3TABS IF NEEDED. (Patient not taking: Reported on 01/07/2016) 50 tablet 0  . acetaminophen (TYLENOL) 500 MG tablet Take 1,000 mg by mouth every 6 (six) hours as needed for moderate pain.     [name] amitriptyline (ELAVIL) 50 MG tablet TAKE 1 TABLET AS NEEDED FOR SLEEP 90 tablet 0  . DULoxetine (CYMBALTA) 30 MG capsule TAKE 1 CAPSULE BY MOUTH DAILY FOR 1 WEEK, THEN UP TO 2 CAPSULES DAILY  0  . oxyCODONE-acetaminophen (PERCOCET) 7.5-325 MG per tablet TAKE 1 TABLET BY MOUTH TWICE A DAY AS NEEDED FOR PAIN  0  . tamsulosin (FLOMAX) 0.4 MG CAPS Take 0.4 mg by mouth daily after breakfast.    . zolpidem (AMBIEN) 10 MG tablet Take 1 tablet (10 mg total) by mouth at bedtime as needed. For sleep (Patient taking differently: Take 15 mg by mouth at bedtime. For sleep) 14 tablet 0   No facility-administered medications prior to visit.     EXAM:  BP 132/82 mmHg  Temp(Src) 98 F (36.7 C) (Oral)  Ht 5\' 9"  (1.753 m)  Wt 187 lb 9.6 oz (85.095 kg)  BMI 27.69 kg/m2  Body mass index is 27.69 kg/(m^2).  GENERAL: vitals reviewed and listed above, alert, oriented, appears well hydrated and in no acute distress HEENT: atraumatic, conjunctiva  clear, no obvious abnormalities on inspection of external nose and ears OP : no lesion edema or exudate  NECK: no obvious masses on inspection palpation  LUNGS: clear to auscultation bilaterally, no wheezes, rales or rhonchi, good air movement CV: HRRR, no clubbing cyanosis or  peripheral edema nl cap refill  Abdomen:  Sof,t normal bowel sounds without hepatosplenomegaly, no guarding rebound or masses no CVA tenderness MS: moves all extremities without noticeable focal  abnormality PSYCH: pleasant and cooperative, no obvious depression or  anxiety   Wt Readings from Last 3 Encounters:  01/07/16 187 lb 9.6 oz (85.095 kg)  05/14/15 182 lb 4.8 oz (82.691 kg)  11/03/14 198 lb 9.6 oz (90.084 kg)    ASSESSMENT AND PLAN:  Discussed the following assessment and plan:  Frequent headaches - montlhy better than in past  sleep hygieine - Plan: Basic metabolic panel, CBC with Differential/Platelet, Hemoglobin A1c, Hepatic function panel, Lipid panel, TSH, T4, free, Vitamin B12  Medication management - Plan: Basic metabolic panel, CBC with Differential/Platelet, Hemoglobin A1c, Hepatic function panel, Lipid panel, TSH, T4, free, Vitamin B12  Bariatric surgery status - Plan: Basic metabolic panel, CBC with Differential/Platelet, Hemoglobin A1c, Hepatic function panel, Lipid panel, TSH, T4, free, Vitamin B12, VITAMIN D 25 Hydroxy (Vit-D Deficiency, Fractures)  Elevated BP - Plan: Basic metabolic panel, CBC with Differential/Platelet, Hemoglobin A1c, Hepatic function panel, Lipid panel, TSH, T4, free, Vitamin B12  Memory difficulties concerns  - could be meds and sleep depriv    cont lsi follow can consider seeing neuro but not at this time needed  Diabetes mellitus without complication in remission - no meds lsi  follow  He is not fasting today and some soda coke with his meds this am  Not checking bg at this time -Patient advised to return or notify health care team  if symptoms worsen ,persist or new concerns arise.  Patient Instructions  Will notify you  of labs when available. May do better with trying a same dose of trazadone    For 5-6 days and see  If this works better for  Sleep.  Memory consider   Seeing neurology but sleep and medications can be  A cause of short term memory issues.   FU depending on   Results .   Wellness or such .     Standley Brooking. Lucile Hillmann M.D.  Lab Results  Component Value Date   WBC 4.2 01/07/2016   HGB 15.5 01/07/2016   HCT 46.0 01/07/2016   PLT 147.0* 01/07/2016   GLUCOSE 137* 01/07/2016   CHOL  158 01/07/2016   TRIG 150.0* 01/07/2016   HDL 38.10* 01/07/2016   LDLDIRECT 113.2 04/01/2007   LDLCALC 90 01/07/2016   ALT 10 01/07/2016   AST 14 01/07/2016   NA 143 01/07/2016   K 4.7 01/07/2016   CL 104 01/07/2016   CREATININE 1.26 01/07/2016   BUN 14 01/07/2016   CO2 31 01/07/2016   TSH 0.93 01/07/2016   PSA 0.76 04/09/2015   INR 0.95 11/03/2014   HGBA1C 5.9 01/07/2016   MICROALBUR 0.6 05/15/2014

## 2016-01-07 ENCOUNTER — Ambulatory Visit (INDEPENDENT_AMBULATORY_CARE_PROVIDER_SITE_OTHER): Payer: Federal, State, Local not specified - PPO | Admitting: Internal Medicine

## 2016-01-07 ENCOUNTER — Encounter: Payer: Self-pay | Admitting: Internal Medicine

## 2016-01-07 VITALS — BP 132/82 | Temp 98.0°F | Ht 69.0 in | Wt 187.6 lb

## 2016-01-07 DIAGNOSIS — Z9884 Bariatric surgery status: Secondary | ICD-10-CM

## 2016-01-07 DIAGNOSIS — E119 Type 2 diabetes mellitus without complications: Secondary | ICD-10-CM

## 2016-01-07 DIAGNOSIS — R413 Other amnesia: Secondary | ICD-10-CM

## 2016-01-07 DIAGNOSIS — R51 Headache: Secondary | ICD-10-CM | POA: Diagnosis not present

## 2016-01-07 DIAGNOSIS — R030 Elevated blood-pressure reading, without diagnosis of hypertension: Secondary | ICD-10-CM | POA: Diagnosis not present

## 2016-01-07 DIAGNOSIS — Z79899 Other long term (current) drug therapy: Secondary | ICD-10-CM | POA: Diagnosis not present

## 2016-01-07 DIAGNOSIS — R519 Headache, unspecified: Secondary | ICD-10-CM

## 2016-01-07 DIAGNOSIS — IMO0001 Reserved for inherently not codable concepts without codable children: Secondary | ICD-10-CM

## 2016-01-07 LAB — BASIC METABOLIC PANEL
BUN: 14 mg/dL (ref 6–23)
CALCIUM: 9.8 mg/dL (ref 8.4–10.5)
CO2: 31 meq/L (ref 19–32)
CREATININE: 1.26 mg/dL (ref 0.40–1.50)
Chloride: 104 mEq/L (ref 96–112)
GFR: 63.32 mL/min (ref >60.00)
GLUCOSE: 137 mg/dL — AB (ref 70–99)
Potassium: 4.7 mEq/L (ref 3.5–5.1)
SODIUM: 143 meq/L (ref 135–145)

## 2016-01-07 LAB — LIPID PANEL
CHOL/HDL RATIO: 4
Cholesterol: 158 mg/dL (ref 0–200)
HDL: 38.1 mg/dL — AB (ref >39.00)
LDL Cholesterol: 90 mg/dL (ref 0–99)
NONHDL: 120.01
TRIGLYCERIDES: 150 mg/dL — AB (ref 0.0–149.0)
VLDL: 30 mg/dL (ref 0.0–40.0)

## 2016-01-07 LAB — CBC WITH DIFFERENTIAL/PLATELET
BASOS ABS: 0 10*3/uL (ref 0.0–0.1)
Basophils Relative: 0.5 % (ref 0.0–3.0)
EOS ABS: 0.1 10*3/uL (ref 0.0–0.7)
Eosinophils Relative: 1.7 % (ref 0.0–5.0)
HCT: 46 % (ref 39.0–52.0)
Hemoglobin: 15.5 g/dL (ref 13.0–17.0)
LYMPHS ABS: 1.8 10*3/uL (ref 0.7–4.0)
Lymphocytes Relative: 41.6 % (ref 12.0–46.0)
MCHC: 33.7 g/dL (ref 30.0–36.0)
MCV: 95.6 fl (ref 78.0–100.0)
MONO ABS: 0.3 10*3/uL (ref 0.1–1.0)
MONOS PCT: 7.5 % (ref 3.0–12.0)
NEUTROS ABS: 2.1 10*3/uL (ref 1.4–7.7)
NEUTROS PCT: 48.7 % (ref 43.0–77.0)
PLATELETS: 147 10*3/uL — AB (ref 150.0–400.0)
RBC: 4.82 Mil/uL (ref 4.22–5.81)
RDW: 13.3 % (ref 11.5–15.5)
WBC: 4.2 10*3/uL (ref 4.0–10.5)

## 2016-01-07 LAB — HEPATIC FUNCTION PANEL
ALBUMIN: 4.7 g/dL (ref 3.5–5.2)
ALK PHOS: 66 U/L (ref 39–117)
ALT: 10 U/L (ref 0–53)
AST: 14 U/L (ref 0–37)
BILIRUBIN DIRECT: 0.1 mg/dL (ref 0.0–0.3)
TOTAL PROTEIN: 6.8 g/dL (ref 6.0–8.3)
Total Bilirubin: 0.5 mg/dL (ref 0.2–1.2)

## 2016-01-07 LAB — VITAMIN B12: VITAMIN B 12: 118 pg/mL — AB (ref 211–911)

## 2016-01-07 LAB — HEMOGLOBIN A1C: HEMOGLOBIN A1C: 5.9 % (ref 4.6–6.5)

## 2016-01-07 LAB — TSH: TSH: 0.93 u[IU]/mL (ref 0.35–4.50)

## 2016-01-07 LAB — T4, FREE: FREE T4: 1.27 ng/dL (ref 0.60–1.60)

## 2016-01-07 LAB — VITAMIN D 25 HYDROXY (VIT D DEFICIENCY, FRACTURES): VITD: 14.97 ng/mL — ABNORMAL LOW (ref 30.00–100.00)

## 2016-01-07 NOTE — Patient Instructions (Addendum)
Will notify you  of labs when available. May do better with trying a same dose of trazadone    For 5-6 days and see  If this works better for  Sleep.  Memory consider   Seeing neurology but sleep and medications can be  A cause of short term memory issues.   FU depending on   Results .   Wellness or such .

## 2016-02-14 DIAGNOSIS — J3489 Other specified disorders of nose and nasal sinuses: Secondary | ICD-10-CM | POA: Diagnosis not present

## 2016-02-21 DIAGNOSIS — M5136 Other intervertebral disc degeneration, lumbar region: Secondary | ICD-10-CM | POA: Diagnosis not present

## 2016-02-21 DIAGNOSIS — S335XXA Sprain of ligaments of lumbar spine, initial encounter: Secondary | ICD-10-CM | POA: Diagnosis not present

## 2016-02-28 DIAGNOSIS — F3132 Bipolar disorder, current episode depressed, moderate: Secondary | ICD-10-CM | POA: Diagnosis not present

## 2016-03-03 DIAGNOSIS — F4310 Post-traumatic stress disorder, unspecified: Secondary | ICD-10-CM | POA: Diagnosis not present

## 2016-03-03 DIAGNOSIS — J3489 Other specified disorders of nose and nasal sinuses: Secondary | ICD-10-CM | POA: Diagnosis not present

## 2016-03-03 DIAGNOSIS — Z01818 Encounter for other preprocedural examination: Secondary | ICD-10-CM | POA: Diagnosis not present

## 2016-03-03 DIAGNOSIS — F419 Anxiety disorder, unspecified: Secondary | ICD-10-CM | POA: Insufficient documentation

## 2016-03-19 ENCOUNTER — Encounter (HOSPITAL_COMMUNITY): Payer: Self-pay | Admitting: *Deleted

## 2016-03-19 ENCOUNTER — Emergency Department (HOSPITAL_COMMUNITY): Payer: Federal, State, Local not specified - PPO

## 2016-03-19 ENCOUNTER — Inpatient Hospital Stay (HOSPITAL_COMMUNITY)
Admission: EM | Admit: 2016-03-19 | Discharge: 2016-03-22 | DRG: 419 | Disposition: A | Discharge status: Home / Self Care | Payer: Federal, State, Local not specified - PPO | Attending: Family Medicine | Admitting: Family Medicine

## 2016-03-19 DIAGNOSIS — F4310 Post-traumatic stress disorder, unspecified: Secondary | ICD-10-CM | POA: Diagnosis not present

## 2016-03-19 DIAGNOSIS — F419 Anxiety disorder, unspecified: Secondary | ICD-10-CM | POA: Diagnosis present

## 2016-03-19 DIAGNOSIS — K851 Biliary acute pancreatitis without necrosis or infection: Secondary | ICD-10-CM | POA: Diagnosis not present

## 2016-03-19 DIAGNOSIS — R109 Unspecified abdominal pain: Secondary | ICD-10-CM | POA: Diagnosis not present

## 2016-03-19 DIAGNOSIS — E559 Vitamin D deficiency, unspecified: Secondary | ICD-10-CM | POA: Diagnosis present

## 2016-03-19 DIAGNOSIS — K810 Acute cholecystitis: Secondary | ICD-10-CM | POA: Diagnosis not present

## 2016-03-19 DIAGNOSIS — Z418 Encounter for other procedures for purposes other than remedying health state: Secondary | ICD-10-CM

## 2016-03-19 DIAGNOSIS — K219 Gastro-esophageal reflux disease without esophagitis: Secondary | ICD-10-CM | POA: Diagnosis not present

## 2016-03-19 DIAGNOSIS — F329 Major depressive disorder, single episode, unspecified: Secondary | ICD-10-CM | POA: Diagnosis not present

## 2016-03-19 DIAGNOSIS — K859 Acute pancreatitis without necrosis or infection, unspecified: Secondary | ICD-10-CM | POA: Diagnosis present

## 2016-03-19 DIAGNOSIS — Z419 Encounter for procedure for purposes other than remedying health state, unspecified: Secondary | ICD-10-CM

## 2016-03-19 DIAGNOSIS — E538 Deficiency of other specified B group vitamins: Secondary | ICD-10-CM | POA: Diagnosis not present

## 2016-03-19 DIAGNOSIS — E119 Type 2 diabetes mellitus without complications: Secondary | ICD-10-CM | POA: Diagnosis not present

## 2016-03-19 DIAGNOSIS — Z8711 Personal history of peptic ulcer disease: Secondary | ICD-10-CM | POA: Diagnosis not present

## 2016-03-19 DIAGNOSIS — Z9884 Bariatric surgery status: Secondary | ICD-10-CM

## 2016-03-19 DIAGNOSIS — E785 Hyperlipidemia, unspecified: Secondary | ICD-10-CM | POA: Diagnosis not present

## 2016-03-19 DIAGNOSIS — K8020 Calculus of gallbladder without cholecystitis without obstruction: Secondary | ICD-10-CM | POA: Diagnosis not present

## 2016-03-19 DIAGNOSIS — K8066 Calculus of gallbladder and bile duct with acute and chronic cholecystitis without obstruction: Secondary | ICD-10-CM | POA: Diagnosis not present

## 2016-03-19 DIAGNOSIS — D696 Thrombocytopenia, unspecified: Secondary | ICD-10-CM | POA: Diagnosis not present

## 2016-03-19 DIAGNOSIS — K8012 Calculus of gallbladder with acute and chronic cholecystitis without obstruction: Secondary | ICD-10-CM | POA: Diagnosis not present

## 2016-03-19 DIAGNOSIS — Z8249 Family history of ischemic heart disease and other diseases of the circulatory system: Secondary | ICD-10-CM | POA: Diagnosis not present

## 2016-03-19 DIAGNOSIS — K828 Other specified diseases of gallbladder: Secondary | ICD-10-CM | POA: Diagnosis not present

## 2016-03-19 LAB — CBC WITH DIFFERENTIAL/PLATELET
BASOS ABS: 0 10*3/uL (ref 0.0–0.1)
BASOS PCT: 1 %
Eosinophils Absolute: 0 10*3/uL (ref 0.0–0.7)
Eosinophils Relative: 0 %
HEMATOCRIT: 46.2 % (ref 39.0–52.0)
HEMOGLOBIN: 15.2 g/dL (ref 13.0–17.0)
LYMPHS PCT: 38 %
Lymphs Abs: 1.4 10*3/uL (ref 0.7–4.0)
MCH: 31.5 pg (ref 26.0–34.0)
MCHC: 32.9 g/dL (ref 30.0–36.0)
MCV: 95.9 fL (ref 78.0–100.0)
MONO ABS: 0.4 10*3/uL (ref 0.1–1.0)
Monocytes Relative: 10 %
NEUTROS ABS: 1.9 10*3/uL (ref 1.7–7.7)
NEUTROS PCT: 51 %
Platelets: 132 10*3/uL — ABNORMAL LOW (ref 150–400)
RBC: 4.82 MIL/uL (ref 4.22–5.81)
RDW: 13.7 % (ref 11.5–15.5)
WBC: 3.7 10*3/uL — AB (ref 4.0–10.5)

## 2016-03-19 LAB — URINALYSIS, ROUTINE W REFLEX MICROSCOPIC
Bilirubin Urine: NEGATIVE
GLUCOSE, UA: NEGATIVE mg/dL
Hgb urine dipstick: NEGATIVE
Ketones, ur: NEGATIVE mg/dL
LEUKOCYTES UA: NEGATIVE
Nitrite: NEGATIVE
PH: 5.5 (ref 5.0–8.0)
PROTEIN: NEGATIVE mg/dL
SPECIFIC GRAVITY, URINE: 1.023 (ref 1.005–1.030)

## 2016-03-19 LAB — COMPREHENSIVE METABOLIC PANEL
ALBUMIN: 4.4 g/dL (ref 3.5–5.0)
ALK PHOS: 77 U/L (ref 38–126)
ALT: 20 U/L (ref 17–63)
AST: 58 U/L — AB (ref 15–41)
Anion gap: 9 (ref 5–15)
BILIRUBIN TOTAL: 1 mg/dL (ref 0.3–1.2)
BUN: 9 mg/dL (ref 6–20)
CO2: 27 mmol/L (ref 22–32)
CREATININE: 1.24 mg/dL (ref 0.61–1.24)
Calcium: 8.9 mg/dL (ref 8.9–10.3)
Chloride: 106 mmol/L (ref 101–111)
GFR calc Af Amer: >60 mL/min (ref >60)
GLUCOSE: 129 mg/dL — AB (ref 65–99)
POTASSIUM: 3.9 mmol/L (ref 3.5–5.1)
Sodium: 142 mmol/L (ref 135–145)
TOTAL PROTEIN: 6.6 g/dL (ref 6.5–8.1)

## 2016-03-19 LAB — LIPASE, BLOOD: LIPASE: 762 U/L — AB (ref 11–51)

## 2016-03-19 MED ORDER — ONDANSETRON HCL 4 MG PO TABS
4.0000 mg | ORAL_TABLET | Freq: Four times a day (QID) | ORAL | Status: DC | PRN
Start: 2016-03-19 — End: 2016-03-21

## 2016-03-19 MED ORDER — ONDANSETRON HCL 4 MG/2ML IJ SOLN
4.0000 mg | Freq: Four times a day (QID) | INTRAMUSCULAR | Status: DC | PRN
  Administered 2016-03-19 – 2016-03-20 (×3): 4 mg via INTRAVENOUS
  Filled 2016-03-19 (×3): qty 2

## 2016-03-19 MED ORDER — SODIUM CHLORIDE 0.9 % IV SOLN
Freq: Once | INTRAVENOUS | Status: AC
  Administered 2016-03-19: 09:00:00 via INTRAVENOUS

## 2016-03-19 MED ORDER — ACETAMINOPHEN 650 MG RE SUPP: 650.0000 mg | Freq: Four times a day (QID) | RECTAL | Status: DC | PRN

## 2016-03-19 MED ORDER — DOCUSATE SODIUM 100 MG PO CAPS
100.0000 mg | ORAL_CAPSULE | Freq: Two times a day (BID) | ORAL | Status: DC
Start: 2016-03-19 — End: 2016-03-22
  Administered 2016-03-19 – 2016-03-22 (×6): 100 mg via ORAL
  Filled 2016-03-19 (×6): qty 1

## 2016-03-19 MED ORDER — HYDROMORPHONE HCL 1 MG/ML IJ SOLN
0.5000 mg | Freq: Once | INTRAMUSCULAR | Status: AC
  Administered 2016-03-19: 0.5 mg via INTRAVENOUS
  Filled 2016-03-19: qty 1

## 2016-03-19 MED ORDER — ESCITALOPRAM OXALATE 20 MG PO TABS
20.0000 mg | ORAL_TABLET | Freq: Every morning | ORAL | Status: DC
  Administered 2016-03-20 – 2016-03-22 (×2): 20 mg via ORAL
  Filled 2016-03-19 (×2): qty 1

## 2016-03-19 MED ORDER — HYDROMORPHONE HCL 1 MG/ML IJ SOLN
0.5000 mg | Freq: Once | INTRAMUSCULAR | Status: AC
  Administered 2016-03-19: 0.5 mg via INTRAVENOUS

## 2016-03-19 MED ORDER — SODIUM CHLORIDE 0.9 % IV SOLN
INTRAVENOUS | Status: AC
  Administered 2016-03-19: 10:00:00 via INTRAVENOUS

## 2016-03-19 MED ORDER — IOPAMIDOL (ISOVUE-300) INJECTION 61%
100.0000 mL | Freq: Once | INTRAVENOUS | Status: AC | PRN
  Administered 2016-03-19: 100 mL via INTRAVENOUS

## 2016-03-19 MED ORDER — ACETAMINOPHEN 325 MG PO TABS
650.0000 mg | ORAL_TABLET | Freq: Four times a day (QID) | ORAL | Status: DC | PRN
  Administered 2016-03-20 (×2): 650 mg via ORAL
  Filled 2016-03-19 (×2): qty 2

## 2016-03-19 MED ORDER — LAMOTRIGINE 200 MG PO TABS
200.0000 mg | ORAL_TABLET | Freq: Every day | ORAL | Status: DC
  Administered 2016-03-20 – 2016-03-22 (×2): 200 mg via ORAL
  Filled 2016-03-19 (×3): qty 1

## 2016-03-19 MED ORDER — SODIUM CHLORIDE 0.9 % IV SOLN
INTRAVENOUS | Status: DC
  Administered 2016-03-19 – 2016-03-20 (×5): via INTRAVENOUS

## 2016-03-19 MED ORDER — TRAZODONE HCL 100 MG PO TABS
300.0000 mg | ORAL_TABLET | Freq: Every day | ORAL | Status: DC
  Administered 2016-03-19 – 2016-03-21 (×3): 300 mg via ORAL
  Filled 2016-03-19 (×3): qty 3

## 2016-03-19 MED ORDER — ENOXAPARIN SODIUM 40 MG/0.4ML ~~LOC~~ SOLN
40.0000 mg | SUBCUTANEOUS | Status: DC
  Administered 2016-03-19 – 2016-03-20 (×2): 40 mg via SUBCUTANEOUS
  Filled 2016-03-19 (×2): qty 0.4

## 2016-03-19 MED ORDER — LAMOTRIGINE 100 MG PO TABS
100.0000 mg | ORAL_TABLET | Freq: Every day | ORAL | Status: DC
  Administered 2016-03-19 – 2016-03-21 (×3): 100 mg via ORAL
  Filled 2016-03-19 (×3): qty 1

## 2016-03-19 MED ORDER — HYDROMORPHONE HCL 1 MG/ML IJ SOLN
0.5000 mg | INTRAMUSCULAR | Status: DC | PRN
  Administered 2016-03-19 – 2016-03-20 (×5): 0.5 mg via INTRAVENOUS
  Filled 2016-03-19 (×5): qty 1

## 2016-03-19 MED ORDER — SODIUM CHLORIDE 0.9 % IV BOLUS (SEPSIS)
1000.0000 mL | Freq: Once | INTRAVENOUS | Status: AC
  Administered 2016-03-19: 1000 mL via INTRAVENOUS

## 2016-03-19 MED ORDER — DIATRIZOATE MEGLUMINE & SODIUM 66-10 % PO SOLN
30.0000 mL | Freq: Once | ORAL | Status: AC
  Administered 2016-03-19: 30 mL via ORAL

## 2016-03-19 MED ORDER — ONDANSETRON HCL 4 MG/2ML IJ SOLN
4.0000 mg | Freq: Once | INTRAMUSCULAR | Status: AC
  Administered 2016-03-19: 4 mg via INTRAVENOUS
  Filled 2016-03-19: qty 2

## 2016-03-19 NOTE — ED Notes (Signed)
Patient transported to CT 

## 2016-03-19 NOTE — ED Provider Notes (Signed)
CSN: [id number]     Arrival date & time 03/19/16  0546 History   First MD Initiated Contact with Patient 03/19/16 [phone]     Chief Complaint  Patient presents with  . Abdominal Pain     (Consider location/radiation/quality/duration/timing/severity/associated sxs/prior Treatment) HPI PARAS TELLMAN is a 55 y.o. male with hx of gastric bypass, perforated  Ulcers, SBO presents to ED with complaint of abdominal pain. Patient states his pain started this morning. Pain is mainly in the upper abdomen. It does not radiate. He reports associated nausea, no vomiting. Pain is gradually worsening. Pain felt similar to that of prior gastric ulcer. He denies any blood in his stool, denies any dark, black tarry stools. Last bowel movement yesterday. He states he was able to go to work this morning, but pain become intolerable at work. He did take Tylenol prior to coming in. Denies fever or chills. No urinary symptoms. No back pain. No other complaints at this time.  Past Medical History  Diagnosis Date  . Hyperlipidemia   . Abnormal LFTs     improved after gastic bypass  . History of concussion   . Gout   . Perforated ulcer ([place])     Surgical repair9/24 2011  . B12 nutritional deficiency   . Closed head injury     laceration  9 12  no loc  vision change  . History of renal stone     Seen in emergency room not urologist  . Chest pain     hopsitalized 2 14 evaluation  . OBESITY 12/10/2007    Qualifier: Diagnosis of  By: [name] MD, Standley Brooking Gastric bypass   . 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   [name] GERD (gastroesophageal reflux disease)     no meds  . DDD (degenerative disc disease)   . 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 Sleep apnea     had gastric bypass/ sa-gone  . OBSTRUCTIVE SLEEP APNEA 07/22/2007    Qualifier: Diagnosis of  By: [name] RN, Doroteo Bradford    . OSA (obstructive  sleep apnea) 12/12/2013  . History of bunionectomy of left great toe   . Anginal pain ([place])   . Anxiety   . Headache     once per week uses Indomethicin hx of cluster headaches   . Diabetes mellitus     resolved with gastric bypass  . DIABETES MELLITUS, TYPE II 04/06/2007    Qualifier: Diagnosis of  By: [name] MD, Standley Brooking Improved and off meds since gastric bypass    Past Surgical History  Procedure Laterality Date  . Vasectomy    . Nasal septoplasty w/ turbinoplasty      x 2  . Gastric bypass  11/06/09    beginning weight 267  . Epidural block injection      by Dr. Nelva Bush  . Surgerical repair of stomach ulcer  06/08/10    per  . Hiatal hernia repair N/A 01/20/2013    Procedure: LAPAROSCOPIC REPAIR OF INTERNAL HERNIA;  Surgeon: Shann Medal, MD;  Location: [facility];  Service: General;  Laterality: N/A;  . Back surgery  2012    Dr. Dorina Hoyer disk  . Tonsillectomy    . Uvulopalatopharyngoplasty  2007    with tonsils  . Shoulder arthroscopy  2014    rt  . Carpal tunnel release  2008  left-ulnar nerve decomp  . Mass excision Right 04/18/2013    Procedure: RIGHT SMALL FINGER SKIN LESION EXCISION;  Surgeon: Jolyn Nap, MD;  Location: [facility];  Service: Orthopedics;  Laterality: Right;  . Carpel tunnel Left   . Lumbar laminectomy/decompression microdiscectomy Left 06/07/2014    Procedure: MICRO LUMBAR DECOMPRESSION L5 - S1 ON THE LEFT/L5 FORAMINOTOMY/EXCISION SYNOVIAL CYST  1 LEVEL;  Surgeon: [name], MD;  Location: [facility];  Service: Orthopedics;  Laterality: Left;  . Colonscopy     . Hernia repair  [id number]    umbilical  . Foot surgery      bilat for removal of extra bone   . Nephrolithotomy Right 11/03/2014    Procedure: RIGHT PERCUTANEOUS NEPHROLITHOTOMY;  Surgeon: Claybon Jabs, MD;  Location: [facility];  Service: Urology;  Laterality: Right;   Family History  Problem Relation Age of Onset  . Uterine cancer Mother   . Hypertension Mother   . Cervical  cancer Mother   . Heart disease Father   . COPD Father   . Colon cancer Maternal Uncle   . Colon cancer Paternal Aunt    Social History  Substance Use Topics  . Smoking status: Never Smoker   . Smokeless tobacco: Never Used  . Alcohol Use: Yes     Comment: occas liquor     Review of Systems  Constitutional: Negative for fever and chills.  Respiratory: Negative for cough, chest tightness and shortness of breath.   Cardiovascular: Negative for chest pain, palpitations and leg swelling.  Gastrointestinal: Positive for nausea and abdominal pain. Negative for vomiting, diarrhea and abdominal distention.  Genitourinary: Negative for dysuria, urgency, frequency and hematuria.  Musculoskeletal: Negative for myalgias, arthralgias, neck pain and neck stiffness.  Skin: Negative for rash.  Allergic/Immunologic: Negative for immunocompromised state.  Neurological: Negative for dizziness, weakness, light-headedness, numbness and headaches.  All other systems reviewed and are negative.     Allergies  Aspirin; Cymbalta; and Hydrocodone  Home Medications   Prior to Admission medications   Medication Sig Start Date End Date Taking? Authorizing Provider  acetaminophen (TYLENOL ARTHRITIS PAIN) 650 MG CR tablet Take 650 mg by mouth every 8 (eight) hours as needed for pain.    Historical Provider, MD  amitriptyline (ELAVIL) 50 MG tablet TAKE 2TABS AT NIGHT FOR 2 TO 3 WEEKS AND INCREASE TO 3TABS IF NEEDED. Patient not taking: Reported on 01/07/2016 11/20/15   Burnis Medin, MD  escitalopram (LEXAPRO) 20 MG tablet Take 20 mg by mouth every morning.     Historical Provider, MD  indomethacin (INDOCIN) 50 MG capsule TAKE 1 CAPSULE BY MOUTH 3 TIMES A DAY AS NEEDED 10/04/15   Burnis Medin, MD  lamoTRIgine (LAMICTAL) 100 MG tablet Take 100-200 mg by mouth 2 (two) times daily. Take 200 mg in the am and 100 mg qhs.    Historical Provider, MD  ondansetron (ZOFRAN ODT) 8 MG disintegrating tablet 8mg  ODT q4  hours prn nausea 07/14/14   Hannah Muthersbaugh, PA-C  traZODone (DESYREL) 100 MG tablet Take 100 mg by mouth at bedtime.    Historical Provider, MD   BP 155/94 mmHg  Pulse 72  Temp(Src) 98.7 F (37.1 C) (Oral)  Resp 18  Ht 5\' 10"  (1.778 m)  Wt 85.276 kg  BMI 26.98 kg/m2  SpO2 100% Physical Exam  Constitutional: He is oriented to person, place, and time. He appears well-developed and well-nourished. No distress.  HENT:  Head: Normocephalic and atraumatic.  Eyes: Conjunctivae are normal.  Neck: Neck supple.  Cardiovascular: Normal rate, regular rhythm and normal heart sounds.   Pulmonary/Chest: Effort normal. No respiratory distress. He has no wheezes. He has no rales.  Abdominal: Soft. Bowel sounds are normal. He exhibits no distension. There is tenderness. There is no rebound and no guarding.  Epigastric, RUQ and LUQ tenderness  Musculoskeletal: He exhibits no edema.  Neurological: He is alert and oriented to person, place, and time.  Skin: Skin is warm and dry.  Nursing note and vitals reviewed.   ED Course  Procedures (including critical care time) Labs Review Labs Reviewed  CBC WITH DIFFERENTIAL/PLATELET - Abnormal; Notable for the following:    WBC 3.7 (*)    Platelets 132 (*)    All other components within normal limits  COMPREHENSIVE METABOLIC PANEL - Abnormal; Notable for the following:    Glucose, Bld 129 (*)    AST 58 (*)    All other components within normal limits  LIPASE, BLOOD - Abnormal; Notable for the following:    Lipase 762 (*)    All other components within normal limits  URINALYSIS, ROUTINE W REFLEX MICROSCOPIC (NOT AT [facility])    Imaging Review US Abdomen Complete  03/19/2016  CLINICAL DATA:  Abdominal pain starting 4 a.m. in the morning EXAM: ABDOMEN ULTRASOUND COMPLETE COMPARISON:  CT scan 03/19/2016 FINDINGS: Gallbladder: Mild distended gallbladder. Gallstones are noted within gallbladder the largest measures 5.2 mm. No thickening of gallbladder  wall. No sonographic Murphy's sign. Small gallbladder sludge. Common bile duct: Diameter: 5.7 mm in diameter within normal limits. Liver: No focal lesion identified. Within normal limits in parenchymal echogenicity. IVC: No abnormality visualized. Pancreas: Limited assessment due to bowel gas. Spleen: Size and appearance within normal limits. Measures 10.9 cm in length Right Kidney: Length: 11.8 cm. Normal echogenicity. No hydronephrosis. There is a lower pole central cyst measures 1.6 x 1.1 cm. Left Kidney: Length: 12 cm. Echogenicity within normal limits. No mass or hydronephrosis visualized. Abdominal aorta: No aneurysm visualized. Measures up to 2.4 cm in diameter. Other findings: None. IMPRESSION: 1. Sludge and gallstones are noted within gallbladder. The largest measures 5.2 mm. No thickening of gallbladder wall. No sonographic Murphy's sign. 2. Normal CBD. 3. No aortic aneurysm. 4. No hydronephrosis. There is a central cyst in lower pole of the right kidney measures 1.6 cm . Electronically Signed   By: Lahoma Crocker M.D.   On: 03/19/2016 09:17   Ct Abdomen Pelvis W Contrast  03/19/2016  CLINICAL DATA:  Abdominal pain for 1 day. EXAM: CT ABDOMEN AND PELVIS WITH CONTRAST TECHNIQUE: Multidetector CT imaging of the abdomen and pelvis was performed using the standard protocol following bolus administration of intravenous contrast. Oral contrast was also administered. CONTRAST:  136mL ISOVUE-300 IOPAMIDOL (ISOVUE-300) INJECTION 61% COMPARISON:  July 14, 2014 FINDINGS: Lower chest: There is patchy airspace consolidation in the lateral segment left lower lobe. There is also bibasilar atelectasis more posteriorly. Hepatobiliary: No focal liver lesions are identified. There is mild periportal edema. Gallbladder is mildly distended without appreciable wall thickening. Hepatic and portal veins are patent. There is no biliary duct dilatation. Pancreas: No pancreatic mass or inflammatory focus. Spleen: No splenic lesions  are evident. Adrenals/Urinary Tract: Adrenals appear normal bilaterally. There are parapelvic cysts in both kidneys. The largest parapelvic cyst is on the left, measuring 2.5 x 1.7 cm. There is no hydronephrosis on either side. There is no appreciable renal or ureteral calculus on either side. Urinary bladder is midline. There  is thickening of the anterior wall of the urinary bladder focally. Stomach/Bowel: The patient is status post gastric bypass procedure without wall thickening or fluid in the postoperative area. The appearance in this area stable compared to prior study. There is no appreciable bowel wall or mesenteric thickening. There is no bowel obstruction. No free air or portal venous air. Vascular/Lymphatic: There is no abdominal aortic aneurysm. No vascular lesions are evident on this study. There is no appreciable adenopathy in the abdomen or pelvis. Reproductive: There are several small prostatic calcifications. Prostate and seminal vesicles appear normal in size and contour. There is no pelvic mass or pelvic fluid collection. Other: Appendix appears unremarkable. There is no demonstrable abscess or ascites in the abdomen or pelvis. Patient has had previous of ventral hernia repair. Suspect previous inguinal hernia repairs as well. Musculoskeletal: Postoperative changes noted in the lower lumbar region. There are no blastic or lytic bone lesions. There is no intramuscular or abdominal wall lesion. IMPRESSION: Patchy infiltrate lateral left base region. There is also bibasilar atelectatic change. 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 No bowel obstruction. No abscess. Gallbladder mildly distended without wall thickening. There is mild periportal edema. Etiology for this periportal edema uncertain. This finding potentially could indicate a degree of underlying hepatitis. No renal or ureteral calculus. No hydronephrosis. There is wall thickening in the anterior aspect  of the urinary bladder. A degree of cystitis is questioned. There are several small prostatic calculi. Prior hernia repairs. Appendix appears normal. Electronically Signed   By: Lowella Grip III M.D.   On: 03/19/2016 08:13   I have personally reviewed and evaluated these images and lab results as part of my medical decision-making.   EKG Interpretation None      MDM   Final diagnoses:  Acute pancreatitis, unspecified pancreatitis type   Patient with upper abdominal pain, history of gastric bypass, perforated gastric ulcers, small bowel obstruction. No vomiting, however patient appears to be uncomfortable. Will order pain medications and get CT abdomen and pelvis and labs.  8:30 AM CT abdomen and pelvis shows distended gallbladder however no gallbladder wall thickening. Also showed patchy infiltrate in lateral left base region, question pneumonia, however patient is afebrile, he denies coughing, also denies any respiratory symptoms. No elevation in white blood cell count. Oxygen 100% on RA. Patient's lipase is elevated suggesting pancreatitis. Question if this could be from pancreatitis. Will order ultrasound of abdomen admit for further treatment for pancreatitis. IV fluids running. Pain medications ordered.  Discussed with hospitalist, asked for surgery consult. Will admit.  10:13 AM Spoke with general surgery, will consult   Filed Vitals:   03/19/16 0700 03/19/16 0815 03/19/16 0933 03/19/16 0947  BP: 132/87  131/74   Pulse: 67 72 64 64  Temp:      TempSrc:      Resp: 11 19 19 18   Height:      Weight:      SpO2: 97% 94% 98% 100%     Jeannett Senior, PA-C 03/19/16 [place], MD 03/19/16 1410

## 2016-03-19 NOTE — H&P (Signed)
History and Physical    Hector [name] [id number] DOB: 04/13/1961 DOA: 03/19/2016  PCP: Lottie Dawson, MD  Patient coming from: Home   Chief Complaint: Abdominal pain.   HPI: Hector [name] is a 55 y.o. male with medical history significant of gastric bypass 2011, gastric ulcer perforation, who presents complaining of abdominal pain, mid abdomen, started early this morning, grabbing, heaviness quality, severe, associated with nausea. He denies vomiting, chills. He had 2 watery stool day prior to admission. He denies drinking alcohol.   ED Course: presents with abdominal pain, lipase 762, AST 58, normal renal function and electrolytes, WBC 3.7. CT abdomen: Patchy infiltrate lateral left base region. There is also bibasilar atelectatic change. Gallbladder mildly distended without wall thickening. There is mild periportal edema. Etiology for this periportal edema uncertain. This finding potentially could indicate a degree of underlying hepatitis.There is wall thickening in the anterior aspect of the urinary bladder. A degree of cystitis is questioned. There are several small prostatic calculi. [place]: gallstone.   Review of Systems: As per HPI otherwise 10 point review of systems negative.    Past Medical History  Diagnosis Date  . Hyperlipidemia   . Abnormal LFTs     improved after gastic bypass  . History of concussion   . Gout   . Perforated ulcer ([place])     Surgical repair9/24 2011  . B12 nutritional deficiency   . Closed head injury     laceration  9 12  no loc  vision change  . History of renal stone     Seen in emergency room not urologist  . Chest pain     hopsitalized 2 14 evaluation  . OBESITY 12/10/2007    Qualifier: Diagnosis of  By: [name] MD, Standley Brooking Gastric bypass   . 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   [name] GERD (gastroesophageal reflux disease)      no meds  . DDD (degenerative disc disease)   . 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 Sleep apnea     had gastric bypass/ sa-gone  . OBSTRUCTIVE SLEEP APNEA 07/22/2007    Qualifier: Diagnosis of  By: [name] RN, Doroteo Bradford    . OSA (obstructive sleep apnea) 12/12/2013  . History of bunionectomy of left great toe   . Anginal pain ([place])   . Anxiety   . Headache     once per week uses Indomethicin hx of cluster headaches   . Diabetes mellitus     resolved with gastric bypass  . DIABETES MELLITUS, TYPE II 04/06/2007    Qualifier: Diagnosis of  By: [name] MD, Standley Brooking Improved and off meds since gastric bypass     Past Surgical History  Procedure Laterality Date  . Vasectomy    . Nasal septoplasty w/ turbinoplasty      x 2  . Gastric bypass  11/06/09    beginning weight 267  . Epidural block injection      by Dr. Nelva Bush  . Surgerical repair of stomach ulcer  06/08/10    per  . Hiatal hernia repair N/A 01/20/2013    Procedure: LAPAROSCOPIC REPAIR OF INTERNAL HERNIA;  Surgeon: Shann Medal, MD;  Location: [facility];  Service: General;  Laterality: N/A;  . Back surgery  2012    Dr. Dorina Hoyer disk  . Tonsillectomy    . Uvulopalatopharyngoplasty  2007    with tonsils  . Shoulder arthroscopy  2014    rt  . Carpal tunnel release  2008    left-ulnar nerve decomp  . Mass excision Right 04/18/2013    Procedure: RIGHT SMALL FINGER SKIN LESION EXCISION;  Surgeon: Jolyn Nap, MD;  Location: [facility];  Service: Orthopedics;  Laterality: Right;  . Carpel tunnel Left   . Lumbar laminectomy/decompression microdiscectomy Left 06/07/2014    Procedure: MICRO LUMBAR DECOMPRESSION L5 - S1 ON THE LEFT/L5 FORAMINOTOMY/EXCISION SYNOVIAL CYST  1 LEVEL;  Surgeon: [name], MD;  Location: [facility];  Service: Orthopedics;  Laterality: Left;  . Colonscopy     . Hernia repair  [id number]    umbilical  . Foot surgery      bilat for removal of extra bone   . Nephrolithotomy Right 11/03/2014      Procedure: RIGHT PERCUTANEOUS NEPHROLITHOTOMY;  Surgeon: Claybon Jabs, MD;  Location: [facility];  Service: Urology;  Laterality: Right;   Social History   reports that he has never smoked. He has never used smokeless tobacco. He reports that he drinks alcohol. He reports that he does not use illicit drugs. he is EMT  Allergies  Allergen Reactions  . Aspirin Other (See Comments)    perforated ulcer  . Cymbalta [Duloxetine Hcl] Other (See Comments)    Over-sedating; slept over 24 hours with one dose.  Possible serotonin syndrome.  . Tape Rash    Surgical tape  . Nsaids Other (See Comments)    ulcer  . Hydrocodone Other (See Comments)    headache    Family History  Problem Relation Age of Onset  . Uterine cancer Mother   . Hypertension Mother   . Cervical cancer Mother   . Heart disease Father   . COPD Father   . Colon cancer Maternal Uncle   . Colon cancer Paternal Aunt      Prior to Admission medications   Medication Sig Start Date End Date Taking? Authorizing Provider  acetaminophen (TYLENOL ARTHRITIS PAIN) 650 MG CR tablet Take 650 mg by mouth every 8 (eight) hours as needed for pain.   Yes Historical Provider, MD  Cholecalciferol (VITAMIN D-1000 MAX ST) 1000 units tablet Take 1,000 Units by mouth daily.   Yes Historical Provider, MD  escitalopram (LEXAPRO) 20 MG tablet Take 20 mg by mouth every morning.    Yes Historical Provider, MD  indomethacin (INDOCIN) 50 MG capsule TAKE 1 CAPSULE BY MOUTH 3 TIMES A DAY AS NEEDED Patient taking differently: TAKE 1 CAPSULE BY MOUTH 3 TIMES A DAY AS NEEDED for migraines 10/04/15  Yes Burnis Medin, MD  lamoTRIgine (LAMICTAL) 100 MG tablet Take 100-200 mg by mouth 2 (two) times daily. Take 200 mg in the am and 100 mg qhs.   Yes Historical Provider, MD  Multiple Vitamin (MULTIVITAMIN) tablet Take 1 tablet by mouth daily.   Yes Historical Provider, MD  ondansetron (ZOFRAN) 4 MG tablet Take 4 mg by mouth every 8 (eight) hours as needed for  nausea or vomiting.   Yes Historical Provider, MD  traZODone (DESYREL) 100 MG tablet Take 300 mg by mouth at bedtime.    Yes Historical Provider, MD  vitamin B-12 (CYANOCOBALAMIN) 1000 MCG tablet Take 1,000 mcg by mouth daily.   Yes Historical Provider, MD  amitriptyline (ELAVIL) 50 MG tablet TAKE 2TABS AT NIGHT FOR 2 TO 3 WEEKS AND INCREASE TO 3TABS IF NEEDED. Patient not taking: Reported on 01/07/2016  11/20/15   Burnis Medin, MD  ondansetron (ZOFRAN ODT) 8 MG disintegrating tablet 8mg  ODT q4 hours prn nausea Patient not taking: Reported on 03/19/2016 07/14/14   Abigail Butts, PA-C    Physical Exam: Filed Vitals:   03/19/16 0553 03/19/16 0700 03/19/16 0815  BP: 155/94 132/87   Pulse: 72 67 72  Temp: 98.7 F (37.1 C)    TempSrc: Oral    Resp: 18 11 19   Height: 5\' 10"  (1.778 m)    Weight: 85.276 kg (188 lb)    SpO2: 100% 97% 94%      Constitutional: NAD, calm, comfortable Filed Vitals:   03/19/16 0553 03/19/16 0700 03/19/16 0815  BP: 155/94 132/87   Pulse: 72 67 72  Temp: 98.7 F (37.1 C)    TempSrc: Oral    Resp: 18 11 19   Height: 5\' 10"  (1.778 m)    Weight: 85.276 kg (188 lb)    SpO2: 100% 97% 94%   Eyes: PERRL, lids and conjunctivae normal ENMT: Mucous membranes are moist. Posterior pharynx clear of any exudate or lesions.Normal dentition.  Neck: normal, supple, no masses, no thyromegaly Respiratory: clear to auscultation bilaterally, no wheezing, no crackles. Normal respiratory effort. No accessory muscle use.  Cardiovascular: Regular rate and rhythm, no murmurs / rubs / gallops. No extremity edema. 2+ pedal pulses. No carotid bruits.  Abdomen: mild mid abdomen  tenderness, no masses palpated. No hepatosplenomegaly. Bowel sounds positive. No RUQ tenderness.  Musculoskeletal: no clubbing / cyanosis. No joint deformity upper and lower extremities. Good ROM, no contractures. Normal muscle tone.  Skin: no rashes, lesions, ulcers. No induration Neurologic: CN 2-12  grossly intact. Sensation intact, DTR normal. Strength 5/5 in all 4.  Psychiatric: Normal judgment and insight. Alert and oriented x 3. Normal mood.    Labs on Admission: I have personally reviewed following labs and imaging studies  CBC:  Recent Labs Lab 03/19/16 0619  WBC 3.7*  NEUTROABS 1.9  HGB 15.2  HCT 46.2  MCV 95.9  PLT [id number]*   Basic Metabolic Panel:  Recent Labs Lab 03/19/16 0619  NA 142  K 3.9  CL 106  CO2 27  GLUCOSE 129*  BUN 9  CREATININE 1.24  CALCIUM 8.9   GFR: Estimated Creatinine Clearance: 70.3 mL/min (by C-G formula based on Cr of 1.24). Liver Function Tests:  Recent Labs Lab 03/19/16 0619  AST 58*  ALT 20  ALKPHOS 77  BILITOT 1.0  PROT 6.6  ALBUMIN 4.4    Recent Labs Lab 03/19/16 0619  LIPASE 762*   No results for input(s): AMMONIA in the last 168 hours. Coagulation Profile: No results for input(s): INR, PROTIME in the last 168 hours. Cardiac Enzymes: No results for input(s): CKTOTAL, CKMB, CKMBINDEX, TROPONINI in the last 168 hours. BNP (last 3 results) No results for input(s): PROBNP in the last 8760 hours. HbA1C: No results for input(s): HGBA1C in the last 72 hours. CBG: No results for input(s): GLUCAP in the last 168 hours. Lipid Profile: No results for input(s): CHOL, HDL, LDLCALC, TRIG, CHOLHDL, LDLDIRECT in the last 72 hours. Thyroid Function Tests: No results for input(s): TSH, T4TOTAL, FREET4, T3FREE, THYROIDAB in the last 72 hours. Anemia Panel: No results for input(s): VITAMINB12, FOLATE, FERRITIN, TIBC, IRON, RETICCTPCT in the last 72 hours. Urine analysis:    Component Value Date/Time   COLORURINE YELLOW 03/19/2016 [place] 03/19/2016 0659   LABSPEC 1.023 03/19/2016 0659   PHURINE 5.5 03/19/2016 [place] 03/19/2016  [place] 03/19/2016 [place] 03/19/2016 0659   BILIRUBINUR n 06/09/2011 1154   [place] 03/19/2016 0659   PROTEINUR  NEGATIVE 03/19/2016 0659   PROTEINUR n 06/09/2011 1154   UROBILINOGEN 1.0 05/09/2015 1844   UROBILINOGEN 0.2 06/09/2011 1154   NITRITE NEGATIVE 03/19/2016 0659   NITRITE n 06/09/2011 1154   LEUKOCYTESUR NEGATIVE 03/19/2016 0659   Sepsis Labs: !!!!!!!!!!!!!!!!!!!!!!!!!!!!!!!!!!!!!!!!!!!! @LABRCNTIP (procalcitonin:4,lacticidven:4) )No results found for this or any previous visit (from the past 240 hour(s)).   Radiological Exams on Admission: US Abdomen Complete  03/19/2016  CLINICAL DATA:  Abdominal pain starting 4 a.m. in the morning EXAM: ABDOMEN ULTRASOUND COMPLETE COMPARISON:  CT scan 03/19/2016 FINDINGS: Gallbladder: Mild distended gallbladder. Gallstones are noted within gallbladder the largest measures 5.2 mm. No thickening of gallbladder wall. No sonographic Murphy's sign. Small gallbladder sludge. Common bile duct: Diameter: 5.7 mm in diameter within normal limits. Liver: No focal lesion identified. Within normal limits in parenchymal echogenicity. IVC: No abnormality visualized. Pancreas: Limited assessment due to bowel gas. Spleen: Size and appearance within normal limits. Measures 10.9 cm in length Right Kidney: Length: 11.8 cm. Normal echogenicity. No hydronephrosis. There is a lower pole central cyst measures 1.6 x 1.1 cm. Left Kidney: Length: 12 cm. Echogenicity within normal limits. No mass or hydronephrosis visualized. Abdominal aorta: No aneurysm visualized. Measures up to 2.4 cm in diameter. Other findings: None. IMPRESSION: 1. Sludge and gallstones are noted within gallbladder. The largest measures 5.2 mm. No thickening of gallbladder wall. No sonographic Murphy's sign. 2. Normal CBD. 3. No aortic aneurysm. 4. No hydronephrosis. There is a central cyst in lower pole of the right kidney measures 1.6 cm . Electronically Signed   By: Lahoma Crocker M.D.   On: 03/19/2016 09:17   Ct Abdomen Pelvis W Contrast  03/19/2016  CLINICAL DATA:  Abdominal pain for 1 day. EXAM: CT ABDOMEN AND PELVIS  WITH CONTRAST TECHNIQUE: Multidetector CT imaging of the abdomen and pelvis was performed using the standard protocol following bolus administration of intravenous contrast. Oral contrast was also administered. CONTRAST:  112mL ISOVUE-300 IOPAMIDOL (ISOVUE-300) INJECTION 61% COMPARISON:  July 14, 2014 FINDINGS: Lower chest: There is patchy airspace consolidation in the lateral segment left lower lobe. There is also bibasilar atelectasis more posteriorly. Hepatobiliary: No focal liver lesions are identified. There is mild periportal edema. Gallbladder is mildly distended without appreciable wall thickening. Hepatic and portal veins are patent. There is no biliary duct dilatation. Pancreas: No pancreatic mass or inflammatory focus. Spleen: No splenic lesions are evident. Adrenals/Urinary Tract: Adrenals appear normal bilaterally. There are parapelvic cysts in both kidneys. The largest parapelvic cyst is on the left, measuring 2.5 x 1.7 cm. There is no hydronephrosis on either side. There is no appreciable renal or ureteral calculus on either side. Urinary bladder is midline. There is thickening of the anterior wall of the urinary bladder focally. Stomach/Bowel: The patient is status post gastric bypass procedure without wall thickening or fluid in the postoperative area. The appearance in this area stable compared to prior study. There is no appreciable bowel wall or mesenteric thickening. There is no bowel obstruction. No free air or portal venous air. Vascular/Lymphatic: There is no abdominal aortic aneurysm. No vascular lesions are evident on this study. There is no appreciable adenopathy in the abdomen or pelvis. Reproductive: There are several small prostatic calcifications. Prostate and seminal vesicles appear normal in size and contour. There is no pelvic mass or pelvic fluid collection. Other: Appendix appears  unremarkable. There is no demonstrable abscess or ascites in the abdomen or pelvis. Patient has  had previous of ventral hernia repair. Suspect previous inguinal hernia repairs as well. Musculoskeletal: Postoperative changes noted in the lower lumbar region. There are no blastic or lytic bone lesions. There is no intramuscular or abdominal wall lesion. IMPRESSION: Patchy infiltrate lateral left base region. There is also bibasilar atelectatic change. 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 No bowel obstruction. No abscess. Gallbladder mildly distended without wall thickening. There is mild periportal edema. Etiology for this periportal edema uncertain. This finding potentially could indicate a degree of underlying hepatitis. No renal or ureteral calculus. No hydronephrosis. There is wall thickening in the anterior aspect of the urinary bladder. A degree of cystitis is questioned. There are several small prostatic calculi. Prior hernia repairs. Appendix appears normal. Electronically Signed   By: Lowella Grip III M.D.   On: 03/19/2016 08:13    EKG: none available.   Assessment/Plan Active Problems:   Pancreatitis  1-Acute pancreatitis;  Presents with abdominal pain, denies alcohol intake.  Abdominal US with cholelithiasis.  IV fluids, NPO.  IV dilaudid. IV zofran PRN.  Surgery consulted due to probably  gallstone pancreatitis.  Repeat lipase level in am.   2-Depression; continue with Lamictal, trazodone.    DVT prophylaxis: lovenox.  Code Status: full code.  Family Communication: care discussed with patient.  Disposition Plan: home at time of discharge.  Consults called: general surgery  Admission status: observation, med-surgery    Elmarie Shiley MD [facility] Pager [phone]  If 7PM-7AM, please contact night-coverage [email or web address] Password [facility]  03/19/2016, 9:34 AM

## 2016-03-19 NOTE — Consult Note (Signed)
Reason for Consult:  Abdominal pain that started early this AM,with nausea, no vomiting Referring Physician: Dr. Rudolpho Sevin PCP:  Lottie Dawson, MD   Hector [name] is an 55 y.o. male.  HPI: Pt presents with above findings that started early this AM around 4:45 AM.  He has a history of gastric bypass and perforated gastric ulcer back in 2011.   Work up in the ED shows he is afebrile, VSS.  Lipase is 762, WBC is 3.7 and LFT's are normal.  UAQ is normal.  CTscan shows: Appendix appears unremarkable. There is no demonstrable abscess or ascites in the abdomen or pelvis. Patient has had previous of ventral hernia repair. Suspect previous inguinal hernia repairs as well.  The patient is status post gastric bypass procedure without wall thickening or fluid in the postoperative area. The appearance in this area stable compared to prior study. There is no appreciable bowel wall or mesenteric thickening. There is no bowel obstruction. No free air or portal venous air.  Gallbladder mildly distended without wall thickening. There is mild periportal edema. Etiology for this periportal edema uncertain. This finding potentially could indicate a degree of underlying hepatitis. Abdominal ultrasound shows:  Sludge and gallstones are noted within gallbladder. The largest measures 5.2 mm. No thickening of gallbladder wall. No sonographic Murphy's sign.   Normal CBD.   We are ask to see.  Past Medical History  Diagnosis Date  . Hyperlipidemia   . Abnormal LFTs     improved after gastic bypass  . History of concussion   . Gout   . Perforated ulcer ([place])     Surgical repair9/24 2011  . B12 nutritional deficiency   . Closed head injury     laceration  9 12  no loc  vision change  . History of renal stone     Seen in emergency room not urologist  . Chest pain     hopsitalized 2 14 evaluation  . OBESITY 12/10/2007    Qualifier: Diagnosis of  By: [name] MD, Standley Brooking Gastric bypass   . ADJ DISORDER WITH MIXED  ANXIETY & DEPRESSED MOOD 08/23/2009    Qualifier: Diagnosis of  By: [name] MD, Standley Brooking Now on lamictal and lexapro and elevil for sleep.   [name] GERD (gastroesophageal reflux disease)     no meds  . DDD (degenerative disc disease)   . 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 Sleep apnea     had gastric bypass/ sa-gone  . OBSTRUCTIVE SLEEP APNEA 07/22/2007    Qualifier: Diagnosis of  By: [name] RN, Doroteo Bradford    . OSA (obstructive sleep apnea) 12/12/2013  . History of bunionectomy of left great toe   . Anginal pain ([place])   . Anxiety   . Headache     once per week uses Indomethicin hx of cluster headaches   . Diabetes mellitus     resolved with gastric bypass  . DIABETES MELLITUS, TYPE II 04/06/2007    Qualifier: Diagnosis of  By: [name] MD, Standley Brooking Improved and off meds since gastric bypass     Past Surgical History  Procedure Laterality Date  . Vasectomy    . Nasal septoplasty w/ turbinoplasty      x 2  . Gastric bypass  11/06/09    beginning weight 267  . Epidural block injection      by Dr. Nelva Bush  . Surgerical repair of stomach ulcer  06/08/10    per  . Hiatal hernia repair N/A  01/20/2013    Procedure: LAPAROSCOPIC REPAIR OF INTERNAL HERNIA;  Surgeon: Shann Medal, MD;  Location: [facility];  Service: General;  Laterality: N/A;  . Back surgery  2012    Dr. Dorina Hoyer disk  . Tonsillectomy    . Uvulopalatopharyngoplasty  2007    with tonsils  . Shoulder arthroscopy  2014    rt  . Carpal tunnel release  2008    left-ulnar nerve decomp  . Mass excision Right 04/18/2013    Procedure: RIGHT SMALL FINGER SKIN LESION EXCISION;  Surgeon: Jolyn Nap, MD;  Location: [facility];  Service: Orthopedics;  Laterality: Right;  . Carpel tunnel Left   . Lumbar laminectomy/decompression microdiscectomy Left 06/07/2014    Procedure: MICRO LUMBAR DECOMPRESSION L5 - S1 ON THE LEFT/L5 FORAMINOTOMY/EXCISION SYNOVIAL CYST  1 LEVEL;  Surgeon: [name], MD;  Location: [facility];   Service: Orthopedics;  Laterality: Left;  . Colonscopy     . Hernia repair  7253    umbilical  . Foot surgery      bilat for removal of extra bone   . Nephrolithotomy Right 11/03/2014    Procedure: RIGHT PERCUTANEOUS NEPHROLITHOTOMY;  Surgeon: Claybon Jabs, MD;  Location: [facility];  Service: Urology;  Laterality: Right;    Family History  Problem Relation Age of Onset  . Uterine cancer Mother   . Hypertension Mother   . Cervical cancer Mother   . Heart disease Father   . COPD Father   . Colon cancer Maternal Uncle   . Colon cancer Paternal Aunt     Social History:  reports that he has never smoked. He has never used smokeless tobacco. He reports that he drinks alcohol. He reports that he does not use illicit drugs. 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  Married  Allergies:  Allergies  Allergen Reactions  . Aspirin Other (See Comments)    perforated ulcer  . Cymbalta [Duloxetine Hcl] Other (See Comments)    Over-sedating; slept over 24 hours with one dose.  Possible serotonin syndrome.  . Tape Rash    Surgical tape  . Nsaids Other (See Comments)    ulcer  . Hydrocodone Other (See Comments)    headache    Medications:  Prior to Admission:  Prescriptions prior to admission  Medication Sig Dispense Refill Last Dose  . acetaminophen (TYLENOL ARTHRITIS PAIN) 650 MG CR tablet Take 650 mg by mouth every 8 (eight) hours as needed for pain.   03/18/2016 at Unknown time  . Cholecalciferol (VITAMIN D-1000 MAX ST) 1000 units tablet Take 1,000 Units by mouth daily.   03/19/2016 at Unknown time  . escitalopram (LEXAPRO) 20 MG tablet Take 20 mg by mouth every morning.    03/19/2016 at Unknown time  . lamoTRIgine (LAMICTAL) 100 MG tablet Take 100-200 mg by mouth 2 (two) times daily. Take 200 mg in the am and 100 mg qhs.   03/19/2016 at Unknown time  . Multiple Vitamin (MULTIVITAMIN) tablet Take 1 tablet by mouth daily.   03/18/2016 at Unknown time  .  ondansetron (ZOFRAN) 4 MG tablet Take 4 mg by mouth every 8 (eight) hours as needed for nausea or vomiting.   Past Month at Unknown time  . traZODone (DESYREL) 100 MG tablet Take 300 mg by mouth at bedtime.    03/18/2016 at Unknown time  . vitamin B-12 (CYANOCOBALAMIN) 1000 MCG tablet Take 1,000 mcg by mouth daily.   03/19/2016  at Unknown time  . ondansetron (ZOFRAN ODT) 8 MG disintegrating tablet 78m ODT q4 hours prn nausea (Patient not taking: Reported on 03/19/2016) 10 tablet 0 Not Taking at Unknown time   Scheduled: . sodium chloride   Intravenous STAT  . docusate sodium  100 mg Oral BID  . enoxaparin (LOVENOX) injection  40 mg Subcutaneous Q24H  . [START ON 03/20/2016] escitalopram  20 mg Oral q morning - 10a  . lamoTRIgine  100 mg Oral QHS  . [START ON 03/20/2016] lamoTRIgine  200 mg Oral Daily  . traZODone  300 mg Oral QHS   Anti-infectives    None      Results for orders placed or performed during the hospital encounter of 03/19/16 (from the past 48 hour(s))  CBC with Differential/Platelet     Status: Abnormal   Collection Time: 03/19/16  6:19 AM  Result Value Ref Range   WBC 3.7 (L) 4.0 - 10.5 K/uL   RBC 4.82 4.22 - 5.81 MIL/uL   Hemoglobin 15.2 13.0 - 17.0 g/dL   HCT 46.2 39.0 - 52.0 %   MCV 95.9 78.0 - 100.0 fL   MCH 31.5 26.0 - 34.0 pg   MCHC 32.9 30.0 - 36.0 g/dL   RDW 13.7 11.5 - 15.5 %   Platelets 132 (L) 150 - 400 K/uL   Neutrophils Relative % 51 %   Neutro Abs 1.9 1.7 - 7.7 K/uL   Lymphocytes Relative 38 %   Lymphs Abs 1.4 0.7 - 4.0 K/uL   Monocytes Relative 10 %   Monocytes Absolute 0.4 0.1 - 1.0 K/uL   Eosinophils Relative 0 %   Eosinophils Absolute 0.0 0.0 - 0.7 K/uL   Basophils Relative 1 %   Basophils Absolute 0.0 0.0 - 0.1 K/uL  Comprehensive metabolic panel     Status: Abnormal   Collection Time: 03/19/16  6:19 AM  Result Value Ref Range   Sodium 142 135 - 145 mmol/L   Potassium 3.9 3.5 - 5.1 mmol/L   Chloride 106 101 - 111 mmol/L   CO2 27 22 - 32 mmol/L    Glucose, Bld 129 (H) 65 - 99 mg/dL   BUN 9 6 - 20 mg/dL   Creatinine, Ser 1.24 0.61 - 1.24 mg/dL   Calcium 8.9 8.9 - 10.3 mg/dL   Total Protein 6.6 6.5 - 8.1 g/dL   Albumin 4.4 3.5 - 5.0 g/dL   AST 58 (H) 15 - 41 U/L   ALT 20 17 - 63 U/L   Alkaline Phosphatase 77 38 - 126 U/L   Total Bilirubin 1.0 0.3 - 1.2 mg/dL   GFR calc non Af Amer >60 >60 mL/min   GFR calc Af Amer >60 >60 mL/min    Comment: (NOTE) The eGFR has been calculated using the CKD EPI equation. This calculation has not been validated in all clinical situations. eGFR's persistently <60 mL/min signify possible Chronic Kidney Disease.    Anion gap 9 5 - 15  Lipase, blood     Status: Abnormal   Collection Time: 03/19/16  6:19 AM  Result Value Ref Range   Lipase 762 (H) 11 - 51 U/L    Comment: RESULTS CONFIRMED BY MANUAL DILUTION  Urinalysis, [facility] reflex [facility]     Status: None   Collection Time: 03/19/16  6:59 AM  Result Value Ref Range   Color, Urine YELLOW YELLOW   APPearance CLEAR CLEAR   Specific Gravity, Urine 1.023 1.005 - 1.030   pH  5.5 5.0 - 8.0   Glucose, UA NEGATIVE NEGATIVE mg/dL   Hgb urine dipstick NEGATIVE NEGATIVE   Bilirubin Urine NEGATIVE NEGATIVE   Ketones, ur NEGATIVE NEGATIVE mg/dL   Protein, ur NEGATIVE NEGATIVE mg/dL   Nitrite NEGATIVE NEGATIVE   Leukocytes, UA NEGATIVE NEGATIVE    Comment: MICROSCOPIC NOT DONE ON URINES WITH NEGATIVE PROTEIN, BLOOD, LEUKOCYTES, NITRITE, OR GLUCOSE <1000 mg/dL.    US Abdomen Complete  03/19/2016  CLINICAL DATA:  Abdominal pain starting 4 a.m. in the morning EXAM: ABDOMEN ULTRASOUND COMPLETE COMPARISON:  CT scan 03/19/2016 FINDINGS: Gallbladder: Mild distended gallbladder. Gallstones are noted within gallbladder the largest measures 5.2 mm. No thickening of gallbladder wall. No sonographic Murphy's sign. Small gallbladder sludge. Common bile duct: Diameter: 5.7 mm in diameter within normal limits. Liver: No focal lesion identified.  Within normal limits in parenchymal echogenicity. IVC: No abnormality visualized. Pancreas: Limited assessment due to bowel gas. Spleen: Size and appearance within normal limits. Measures 10.9 cm in length Right Kidney: Length: 11.8 cm. Normal echogenicity. No hydronephrosis. There is a lower pole central cyst measures 1.6 x 1.1 cm. Left Kidney: Length: 12 cm. Echogenicity within normal limits. No mass or hydronephrosis visualized. Abdominal aorta: No aneurysm visualized. Measures up to 2.4 cm in diameter. Other findings: None. IMPRESSION: 1. Sludge and gallstones are noted within gallbladder. The largest measures 5.2 mm. No thickening of gallbladder wall. No sonographic Murphy's sign. 2. Normal CBD. 3. No aortic aneurysm. 4. No hydronephrosis. There is a central cyst in lower pole of the right kidney measures 1.6 cm . Electronically Signed   By: Lahoma Crocker M.D.   On: 03/19/2016 09:17   Ct Abdomen Pelvis W Contrast  03/19/2016  CLINICAL DATA:  Abdominal pain for 1 day. EXAM: CT ABDOMEN AND PELVIS WITH CONTRAST TECHNIQUE: Multidetector CT imaging of the abdomen and pelvis was performed using the standard protocol following bolus administration of intravenous contrast. Oral contrast was also administered. CONTRAST:  170m ISOVUE-300 IOPAMIDOL (ISOVUE-300) INJECTION 61% COMPARISON:  July 14, 2014 FINDINGS: Lower chest: There is patchy airspace consolidation in the lateral segment left lower lobe. There is also bibasilar atelectasis more posteriorly. Hepatobiliary: No focal liver lesions are identified. There is mild periportal edema. Gallbladder is mildly distended without appreciable wall thickening. Hepatic and portal veins are patent. There is no biliary duct dilatation. Pancreas: No pancreatic mass or inflammatory focus. Spleen: No splenic lesions are evident. Adrenals/Urinary Tract: Adrenals appear normal bilaterally. There are parapelvic cysts in both kidneys. The largest parapelvic cyst is on the left,  measuring 2.5 x 1.7 cm. There is no hydronephrosis on either side. There is no appreciable renal or ureteral calculus on either side. Urinary bladder is midline. There is thickening of the anterior wall of the urinary bladder focally. Stomach/Bowel: The patient is status post gastric bypass procedure without wall thickening or fluid in the postoperative area. The appearance in this area stable compared to prior study. There is no appreciable bowel wall or mesenteric thickening. There is no bowel obstruction. No free air or portal venous air. Vascular/Lymphatic: There is no abdominal aortic aneurysm. No vascular lesions are evident on this study. There is no appreciable adenopathy in the abdomen or pelvis. Reproductive: There are several small prostatic calcifications. Prostate and seminal vesicles appear normal in size and contour. There is no pelvic mass or pelvic fluid collection. Other: Appendix appears unremarkable. There is no demonstrable abscess or ascites in the abdomen or pelvis. Patient has had previous of ventral hernia repair. Suspect  previous inguinal hernia repairs as well. Musculoskeletal: Postoperative changes noted in the lower lumbar region. There are no blastic or lytic bone lesions. There is no intramuscular or abdominal wall lesion. IMPRESSION: Patchy infiltrate lateral left base region. There is also bibasilar atelectatic change. 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 No bowel obstruction. No abscess. Gallbladder mildly distended without wall thickening. There is mild periportal edema. Etiology for this periportal edema uncertain. This finding potentially could indicate a degree of underlying hepatitis. No renal or ureteral calculus. No hydronephrosis. There is wall thickening in the anterior aspect of the urinary bladder. A degree of cystitis is questioned. There are several small prostatic calculi. Prior hernia repairs. Appendix appears normal.  Electronically Signed   By: Lowella Grip III M.D.   On: 03/19/2016 08:13    Review of Systems  Constitutional: Positive for weight loss (300 pre gastric bypass, down to 185 now.). Negative for fever.  Eyes: Negative.   Respiratory: Negative.   Cardiovascular: Negative.   Gastrointestinal: Positive for nausea and abdominal pain. Negative for heartburn, vomiting, diarrhea, constipation, blood in stool and melena.  Genitourinary: Negative.   Musculoskeletal: Negative.   Skin: Negative.   Neurological: Positive for sensory change (complains of some lower leg neuropathy since surgery), seizures (about a year ago after spinal surgery, not on anything for seizures now.) and headaches.  Endo/Heme/Allergies: Negative.   Psychiatric/Behavioral: Positive for depression. The patient is nervous/anxious.        Recently diagnosed with PTSD   Blood pressure 133/83, pulse 66, temperature 98.7 F (37.1 C), temperature source Oral, resp. rate 11, height 5' 10" (1.778 m), weight 85.276 kg (188 lb), SpO2 96 %. Physical Exam  Constitutional: He is oriented to person, place, and time. He appears well-developed and well-nourished. No distress.  HENT:  Head: Normocephalic and atraumatic.  Nose: Nose normal.  Eyes: Right eye exhibits no discharge. Left eye exhibits no discharge. No scleral icterus.  Neck: Normal range of motion. Neck supple. No JVD present. No tracheal deviation present. No thyromegaly present.  Cardiovascular: Regular rhythm, normal heart sounds and intact distal pulses.   No murmur heard. He is a little tachycardic  Respiratory: Effort normal and breath sounds normal. No respiratory distress. He has no wheezes. He has no rales. He exhibits no tenderness.  GI: Soft. Bowel sounds are normal. He exhibits no mass. There is tenderness (pain is mid epigastric area.). There is no rebound and no guarding.  Mid abdominal incision along the umbilicus, all well healed.  Musculoskeletal: He  exhibits no edema.  Lymphadenopathy:    He has no cervical adenopathy.  Neurological: He is alert and oriented to person, place, and time. No cranial nerve deficit.  Skin: Skin is warm and dry. No rash noted. He is not diaphoretic. No erythema. No pallor.  Psychiatric: He has a normal mood and affect. His behavior is normal. Judgment and thought content normal.    Assessment/Plan: Acute pancreatitis Cholelithiasis/sludge in gallbladder S/p gastric bypass 10/2009/ gastric perforation 05/2010 /Hernia repair 01/20/13 Hx of OSA - NOW resolved B12 deficiency Adjustment disorder/anxiety/Depression/ recent Dx PTSD -service related. Hx of seizure - post op laminectomy   Laminectomy 05/2014 with neuropathy post op  Plan:  i would keep him NPO for now, I have added lipid panel to AM labs.  He does not drink per patient and if lipids are not severely elevated I think we will consider cholecystectomy after his pancreatitis is better.  Labs in AM, we will follow  with you.  Would benefit from Medical clearance before surgery.  Brinklee Cisse 03/19/2016, 1:29 PM

## 2016-03-19 NOTE — ED Notes (Signed)
Pt states that he began having abd pain around 4:45 this am; pt states that the pain has gotten progressively worse as the morning has gone on;pt c/o nausea with no vomiting; pt states that he has a hx of SBO and perforated ulcer; pt also states that he has had gastric bypass

## 2016-03-20 DIAGNOSIS — E538 Deficiency of other specified B group vitamins: Secondary | ICD-10-CM | POA: Diagnosis present

## 2016-03-20 DIAGNOSIS — K8020 Calculus of gallbladder without cholecystitis without obstruction: Secondary | ICD-10-CM | POA: Diagnosis not present

## 2016-03-20 DIAGNOSIS — E559 Vitamin D deficiency, unspecified: Secondary | ICD-10-CM | POA: Diagnosis not present

## 2016-03-20 DIAGNOSIS — E785 Hyperlipidemia, unspecified: Secondary | ICD-10-CM | POA: Diagnosis present

## 2016-03-20 DIAGNOSIS — F419 Anxiety disorder, unspecified: Secondary | ICD-10-CM | POA: Diagnosis present

## 2016-03-20 DIAGNOSIS — F4310 Post-traumatic stress disorder, unspecified: Secondary | ICD-10-CM | POA: Diagnosis present

## 2016-03-20 DIAGNOSIS — K810 Acute cholecystitis: Secondary | ICD-10-CM | POA: Diagnosis not present

## 2016-03-20 DIAGNOSIS — E119 Type 2 diabetes mellitus without complications: Secondary | ICD-10-CM | POA: Diagnosis not present

## 2016-03-20 DIAGNOSIS — Z8249 Family history of ischemic heart disease and other diseases of the circulatory system: Secondary | ICD-10-CM | POA: Diagnosis not present

## 2016-03-20 DIAGNOSIS — Z8711 Personal history of peptic ulcer disease: Secondary | ICD-10-CM | POA: Diagnosis not present

## 2016-03-20 DIAGNOSIS — D696 Thrombocytopenia, unspecified: Secondary | ICD-10-CM | POA: Diagnosis present

## 2016-03-20 DIAGNOSIS — R109 Unspecified abdominal pain: Secondary | ICD-10-CM | POA: Diagnosis present

## 2016-03-20 DIAGNOSIS — K859 Acute pancreatitis without necrosis or infection, unspecified: Secondary | ICD-10-CM | POA: Diagnosis not present

## 2016-03-20 DIAGNOSIS — K219 Gastro-esophageal reflux disease without esophagitis: Secondary | ICD-10-CM | POA: Diagnosis present

## 2016-03-20 DIAGNOSIS — K8012 Calculus of gallbladder with acute and chronic cholecystitis without obstruction: Secondary | ICD-10-CM | POA: Diagnosis not present

## 2016-03-20 DIAGNOSIS — K8066 Calculus of gallbladder and bile duct with acute and chronic cholecystitis without obstruction: Secondary | ICD-10-CM | POA: Diagnosis not present

## 2016-03-20 DIAGNOSIS — Z9884 Bariatric surgery status: Secondary | ICD-10-CM

## 2016-03-20 DIAGNOSIS — K851 Biliary acute pancreatitis without necrosis or infection: Secondary | ICD-10-CM | POA: Diagnosis present

## 2016-03-20 DIAGNOSIS — F329 Major depressive disorder, single episode, unspecified: Secondary | ICD-10-CM | POA: Diagnosis present

## 2016-03-20 LAB — COMPREHENSIVE METABOLIC PANEL
ALBUMIN: 3.7 g/dL (ref 3.5–5.0)
ALT: 43 U/L (ref 17–63)
ANION GAP: 5 (ref 5–15)
AST: 48 U/L — ABNORMAL HIGH (ref 15–41)
Alkaline Phosphatase: 113 U/L (ref 38–126)
BUN: 10 mg/dL (ref 6–20)
CHLORIDE: 105 mmol/L (ref 101–111)
CO2: 29 mmol/L (ref 22–32)
Calcium: 8.5 mg/dL — ABNORMAL LOW (ref 8.9–10.3)
Creatinine, Ser: 1.2 mg/dL (ref 0.61–1.24)
GFR calc Af Amer: >60 mL/min (ref >60)
GFR calc non Af Amer: >60 mL/min (ref >60)
GLUCOSE: 122 mg/dL — AB (ref 65–99)
POTASSIUM: 4.3 mmol/L (ref 3.5–5.1)
SODIUM: 139 mmol/L (ref 135–145)
Total Bilirubin: 1 mg/dL (ref 0.3–1.2)
Total Protein: 5.8 g/dL — ABNORMAL LOW (ref 6.5–8.1)

## 2016-03-20 LAB — CBC
HEMATOCRIT: 42.9 % (ref 39.0–52.0)
HEMOGLOBIN: 14.3 g/dL (ref 13.0–17.0)
MCH: 32.1 pg (ref 26.0–34.0)
MCHC: 33.3 g/dL (ref 30.0–36.0)
MCV: 96.4 fL (ref 78.0–100.0)
Platelets: 97 10*3/uL — ABNORMAL LOW (ref 150–400)
RBC: 4.45 MIL/uL (ref 4.22–5.81)
RDW: 13.7 % (ref 11.5–15.5)
WBC: 4.9 10*3/uL (ref 4.0–10.5)

## 2016-03-20 LAB — LIPID PANEL
CHOL/HDL RATIO: 3 ratio
CHOLESTEROL: 101 mg/dL (ref 0–200)
HDL: 34 mg/dL — ABNORMAL LOW (ref >40)
LDL Cholesterol: 43 mg/dL (ref 0–99)
Triglycerides: 120 mg/dL (ref <150)
VLDL: 24 mg/dL (ref 0–40)

## 2016-03-20 LAB — SURGICAL PCR SCREEN
MRSA, PCR: NEGATIVE
Staphylococcus aureus: NEGATIVE

## 2016-03-20 LAB — PROTIME-INR
INR: 1.07 (ref 0.00–1.49)
PROTHROMBIN TIME: 14.1 s (ref 11.6–15.2)

## 2016-03-20 LAB — LIPASE, BLOOD: Lipase: 133 U/L — ABNORMAL HIGH (ref 11–51)

## 2016-03-20 LAB — VITAMIN B12: Vitamin B-12: 491 pg/mL (ref 180–914)

## 2016-03-20 MED ORDER — CEFAZOLIN (ANCEF) 1 G IV SOLR
2.0000 g | INTRAVENOUS | Status: AC
  Filled 2016-03-20: qty 2

## 2016-03-20 NOTE — Progress Notes (Signed)
Patient well known to me for gastric bypass that I did in Feb 2011.  He has successfully kept off more than 100 pounds.  He presented to [facility] this time with gall stone pancreatitis.  He is rapidly getting better.  He has been seen by my partner, Dr. Rosendo Gros, who has spoken to me about the patient.  I will tentatively schedule his lap chole for Saturday, 03/22/2016.    I discussed with the patient the indications and risks of gall bladder surgery.  The primary risks of gall bladder surgery include, but are not limited to, bleeding, infection, common bile duct injury, and open surgery.  We discussed the typical post-operative recovery course. I tried to answer the patient's questions.  His biggest risk is having retained common bile duct stones.  Will plan intraoperative cholangiogram.  Alphonsa Overall, MD, [facility] Pager: [phone] Office phone:  [phone]

## 2016-03-20 NOTE — Progress Notes (Signed)
PROGRESS NOTE   Hector [name]  [id number]  DOB: [birth year]  DOA: 03/19/2016 PCP: Lottie Dawson, MD Outpatient Specialists:  Hospital course:  HPI: Hector [name] is a 55 y.o. male with medical history significant of gastric bypass 2011, gastric ulcer perforation, who presents complaining of abdominal pain, mid abdomen, started early this morning, grabbing, heaviness quality, severe, associated with nausea. He denies vomiting, chills. He had 2 watery stool day prior to admission. He denies drinking alcohol.  ED Course: presents with abdominal pain, lipase 762, AST 58, normal renal function and electrolytes, WBC 3.7. CT abdomen: Patchy infiltrate lateral left base region. There is also bibasilar atelectatic change. Gallbladder mildly distended without wall thickening. There is mild periportal edema. Etiology for this periportal edema uncertain. This finding potentially could indicate a degree of underlying hepatitis.There is wall thickening in the anterior aspect of the urinary bladder. A degree of cystitis is questioned. There are several small prostatic calculi. [place]: gallstone.   Assessment & Plan:   Acute gallstone pancreatitis / cholecystitis - appreciate surgery assistance, allowing gallbladder to cool down before planned cholecystectomy, continue current management.   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  Code Status: full code.  Family Communication: care discussed with patient.  Disposition Plan: home at time of discharge.  Consults called: general surgery  Admission status: observation, med-surgery   Consultants:  General surgery  Procedures:  n/a  Antimicrobials: Anti-infectives    Start     Dose/Rate Route Frequency Ordered Stop   03/20/16 0900  ceFAZolin (ANCEF) powder 2 g     2 g Other On call to O.R. 03/20/16 0845 03/21/16 0559     Subjective: Pt reports feeling better  Objective: Filed Vitals:   03/19/16 1000 03/19/16 1108 03/19/16 2240 03/20/16 0516  BP: 133/83 144/99 113/64 119/67  Pulse: 66 68 82 76  Temp:  98.9 F (37.2 C) 98.3 F (36.8 C) 99.8 F (37.7 C)  TempSrc:  Oral Oral Oral  Resp: 11 18 17 17   Height:      Weight:      SpO2: 96% 99% 96% 100%    Intake/Output Summary (Last 24 hours) at 03/20/16 1313 Last data filed at 03/20/16 0912  Gross per 24 hour  Intake 1448.75 ml  Output    675 ml  Net 773.75 ml   Filed Weights   03/19/16 0553  Weight: 188 lb (85.276 kg)   Exam:  General exam: awake, alert, no distress, cooperative.  Respiratory system: Clear. No increased work of breathing. Cardiovascular system: S1 & S2 heard, RRR. No JVD, murmurs, gallops, clicks or pedal edema. Gastrointestinal system: Abdomen is tender. Normal bowel sounds heard. Central nervous system: Alert and oriented. No focal neurological deficits. Extremities: Symmetric 5 x 5 power.  Data Reviewed: Basic Metabolic Panel:  Recent Labs Lab 03/19/16 0619 03/20/16 0452  NA 142 139  K 3.9 4.3  CL 106 105  CO2 27 29  GLUCOSE 129* 122*  BUN 9 10  CREATININE 1.24 1.20  CALCIUM 8.9 8.5*   Liver Function Tests:  Recent Labs Lab 03/19/16 0619 03/20/16 0452  AST 58* 48*  ALT 20 43  ALKPHOS 77 113  BILITOT 1.0 1.0  PROT 6.6 5.8*  ALBUMIN 4.4 3.7    Recent Labs Lab 03/19/16 0619 03/20/16 0452  LIPASE 762*  133*   No results for input(s): AMMONIA in the last 168 hours. CBC:  Recent Labs Lab 03/19/16 0619 03/20/16 0452  WBC 3.7* 4.9  NEUTROABS 1.9  --   HGB 15.2 14.3  HCT 46.2 42.9  MCV 95.9 96.4  PLT 132* 97*   Cardiac Enzymes: No results for input(s): CKTOTAL, CKMB, CKMBINDEX, TROPONINI in the last 168 hours. BNP (last 3 results) No results for input(s): PROBNP in the last 8760  hours. CBG: No results for input(s): GLUCAP in the last 168 hours.  Recent Results (from the past 240 hour(s))  Surgical PCR screen     Status: None   Collection Time: 03/20/16 10:10 AM  Result Value Ref Range Status   MRSA, PCR NEGATIVE NEGATIVE Final   Staphylococcus aureus NEGATIVE NEGATIVE Final    Comment:        The Xpert SA Assay (FDA approved for NASAL specimens in patients over 53 years of age), is one component of a comprehensive surveillance program.  Test performance has been validated by [facility] for patients greater than or equal to 98 year old. It is not intended to diagnose infection nor to guide or monitor treatment.      Studies: US Abdomen Complete  03/19/2016  CLINICAL DATA:  Abdominal pain starting 4 a.m. in the morning EXAM: ABDOMEN ULTRASOUND COMPLETE COMPARISON:  CT scan 03/19/2016 FINDINGS: Gallbladder: Mild distended gallbladder. Gallstones are noted within gallbladder the largest measures 5.2 mm. No thickening of gallbladder wall. No sonographic Murphy's sign. Small gallbladder sludge. Common bile duct: Diameter: 5.7 mm in diameter within normal limits. Liver: No focal lesion identified. Within normal limits in parenchymal echogenicity. IVC: No abnormality visualized. Pancreas: Limited assessment due to bowel gas. Spleen: Size and appearance within normal limits. Measures 10.9 cm in length Right Kidney: Length: 11.8 cm. Normal echogenicity. No hydronephrosis. There is a lower pole central cyst measures 1.6 x 1.1 cm. Left Kidney: Length: 12 cm. Echogenicity within normal limits. No mass or hydronephrosis visualized. Abdominal aorta: No aneurysm visualized. Measures up to 2.4 cm in diameter. Other findings: None. IMPRESSION: 1. Sludge and gallstones are noted within gallbladder. The largest measures 5.2 mm. No thickening of gallbladder wall. No sonographic Murphy's sign. 2. Normal CBD. 3. No aortic aneurysm. 4. No hydronephrosis. There is a central cyst in lower  pole of the right kidney measures 1.6 cm . Electronically Signed   By: Lahoma Crocker M.D.   On: 03/19/2016 09:17   Ct Abdomen Pelvis W Contrast  03/19/2016  CLINICAL DATA:  Abdominal pain for 1 day. EXAM: CT ABDOMEN AND PELVIS WITH CONTRAST TECHNIQUE: Multidetector CT imaging of the abdomen and pelvis was performed using the standard protocol following bolus administration of intravenous contrast. Oral contrast was also administered. CONTRAST:  151mL ISOVUE-300 IOPAMIDOL (ISOVUE-300) INJECTION 61% COMPARISON:  July 14, 2014 FINDINGS: Lower chest: There is patchy airspace consolidation in the lateral segment left lower lobe. There is also bibasilar atelectasis more posteriorly. Hepatobiliary: No focal liver lesions are identified. There is mild periportal edema. Gallbladder is mildly distended without appreciable wall thickening. Hepatic and portal veins are patent. There is no biliary duct dilatation. Pancreas: No pancreatic mass or inflammatory focus. Spleen: No splenic lesions are evident. Adrenals/Urinary Tract: Adrenals appear normal bilaterally. There are parapelvic cysts in both kidneys. The largest parapelvic cyst is on the left, measuring 2.5 x 1.7 cm. There is no hydronephrosis on either side. There is no appreciable renal or ureteral calculus on either side. Urinary bladder is midline.  There is thickening of the anterior wall of the urinary bladder focally. Stomach/Bowel: The patient is status post gastric bypass procedure without wall thickening or fluid in the postoperative area. The appearance in this area stable compared to prior study. There is no appreciable bowel wall or mesenteric thickening. There is no bowel obstruction. No free air or portal venous air. Vascular/Lymphatic: There is no abdominal aortic aneurysm. No vascular lesions are evident on this study. There is no appreciable adenopathy in the abdomen or pelvis. Reproductive: There are several small prostatic calcifications. Prostate and  seminal vesicles appear normal in size and contour. There is no pelvic mass or pelvic fluid collection. Other: Appendix appears unremarkable. There is no demonstrable abscess or ascites in the abdomen or pelvis. Patient has had previous of ventral hernia repair. Suspect previous inguinal hernia repairs as well. Musculoskeletal: Postoperative changes noted in the lower lumbar region. There are no blastic or lytic bone lesions. There is no intramuscular or abdominal wall lesion. IMPRESSION: Patchy infiltrate lateral left base region. There is also bibasilar atelectatic change. 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 No bowel obstruction. No abscess. Gallbladder mildly distended without wall thickening. There is mild periportal edema. Etiology for this periportal edema uncertain. This finding potentially could indicate a degree of underlying hepatitis. No renal or ureteral calculus. No hydronephrosis. There is wall thickening in the anterior aspect of the urinary bladder. A degree of cystitis is questioned. There are several small prostatic calculi. Prior hernia repairs. Appendix appears normal. Electronically Signed   By: Lowella Grip III M.D.   On: 03/19/2016 08:13   Scheduled Meds: . ceFAZolin  2 g Other On Call to OR  . docusate sodium  100 mg Oral BID  . enoxaparin (LOVENOX) injection  40 mg Subcutaneous Q24H  . escitalopram  20 mg Oral q morning - 10a  . lamoTRIgine  100 mg Oral QHS  . lamoTRIgine  200 mg Oral Daily  . traZODone  300 mg Oral QHS   Continuous Infusions: . 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

## 2016-03-20 NOTE — Progress Notes (Signed)
Subjective: Feeling better today. Less abd pain  Objective: Vital signs in last 24 hours: Temp:  [98.3 F (36.8 C)-99.8 F (37.7 C)] 99.8 F (37.7 C) (07/06 0516) Pulse Rate:  [64-82] 76 (07/06 0516) Resp:  [11-19] 17 (07/06 0516) BP: (113-144)/(64-99) 119/67 mmHg (07/06 0516) SpO2:  [96 %-100 %] 100 % (07/06 0516) Last BM Date: 03/18/16  Intake/Output from previous day: 07/05 0701 - 07/06 0700 In: 1448.8 [P.O.:30; I.V.:1418.8] Out: 475 [Urine:475] Intake/Output this shift:    General appearance: alert and cooperative GI: soft, ttp epigastrum  Lab Results:   Recent Labs  03/19/16 0619 03/20/16 0452  WBC 3.7* 4.9  HGB 15.2 14.3  HCT 46.2 42.9  PLT 132* 97*   BMET  Recent Labs  03/19/16 0619 03/20/16 0452  NA 142 139  K 3.9 4.3  CL 106 105  CO2 27 29  GLUCOSE 129* 122*  BUN 9 10  CREATININE 1.24 1.20  CALCIUM 8.9 8.5*   PT/INR  Recent Labs  03/20/16 0452  LABPROT 14.1  INR 1.07   ABG No results for input(s): PHART, HCO3 in the last 72 hours.  Invalid input(s): PCO2, PO2  Studies/Results: US Abdomen Complete  03/19/2016  CLINICAL DATA:  Abdominal pain starting 4 a.m. in the morning EXAM: ABDOMEN ULTRASOUND COMPLETE COMPARISON:  CT scan 03/19/2016 FINDINGS: Gallbladder: Mild distended gallbladder. Gallstones are noted within gallbladder the largest measures 5.2 mm. No thickening of gallbladder wall. No sonographic Murphy's sign. Small gallbladder sludge. Common bile duct: Diameter: 5.7 mm in diameter within normal limits. Liver: No focal lesion identified. Within normal limits in parenchymal echogenicity. IVC: No abnormality visualized. Pancreas: Limited assessment due to bowel gas. Spleen: Size and appearance within normal limits. Measures 10.9 cm in length Right Kidney: Length: 11.8 cm. Normal echogenicity. No hydronephrosis. There is a lower pole central cyst measures 1.6 x 1.1 cm. Left Kidney: Length: 12 cm. Echogenicity within normal limits. No  mass or hydronephrosis visualized. Abdominal aorta: No aneurysm visualized. Measures up to 2.4 cm in diameter. Other findings: None. IMPRESSION: 1. Sludge and gallstones are noted within gallbladder. The largest measures 5.2 mm. No thickening of gallbladder wall. No sonographic Murphy's sign. 2. Normal CBD. 3. No aortic aneurysm. 4. No hydronephrosis. There is a central cyst in lower pole of the right kidney measures 1.6 cm . Electronically Signed   By: Lahoma Crocker M.D.   On: 03/19/2016 09:17   Ct Abdomen Pelvis W Contrast  03/19/2016  CLINICAL DATA:  Abdominal pain for 1 day. EXAM: CT ABDOMEN AND PELVIS WITH CONTRAST TECHNIQUE: Multidetector CT imaging of the abdomen and pelvis was performed using the standard protocol following bolus administration of intravenous contrast. Oral contrast was also administered. CONTRAST:  170mL ISOVUE-300 IOPAMIDOL (ISOVUE-300) INJECTION 61% COMPARISON:  July 14, 2014 FINDINGS: Lower chest: There is patchy airspace consolidation in the lateral segment left lower lobe. There is also bibasilar atelectasis more posteriorly. Hepatobiliary: No focal liver lesions are identified. There is mild periportal edema. Gallbladder is mildly distended without appreciable wall thickening. Hepatic and portal veins are patent. There is no biliary duct dilatation. Pancreas: No pancreatic mass or inflammatory focus. Spleen: No splenic lesions are evident. Adrenals/Urinary Tract: Adrenals appear normal bilaterally. There are parapelvic cysts in both kidneys. The largest parapelvic cyst is on the left, measuring 2.5 x 1.7 cm. There is no hydronephrosis on either side. There is no appreciable renal or ureteral calculus on either side. Urinary bladder is midline. There is thickening of the anterior wall of  the urinary bladder focally. Stomach/Bowel: The patient is status post gastric bypass procedure without wall thickening or fluid in the postoperative area. The appearance in this area stable compared  to prior study. There is no appreciable bowel wall or mesenteric thickening. There is no bowel obstruction. No free air or portal venous air. Vascular/Lymphatic: There is no abdominal aortic aneurysm. No vascular lesions are evident on this study. There is no appreciable adenopathy in the abdomen or pelvis. Reproductive: There are several small prostatic calcifications. Prostate and seminal vesicles appear normal in size and contour. There is no pelvic mass or pelvic fluid collection. Other: Appendix appears unremarkable. There is no demonstrable abscess or ascites in the abdomen or pelvis. Patient has had previous of ventral hernia repair. Suspect previous inguinal hernia repairs as well. Musculoskeletal: Postoperative changes noted in the lower lumbar region. There are no blastic or lytic bone lesions. There is no intramuscular or abdominal wall lesion. IMPRESSION: Patchy infiltrate lateral left base region. There is also bibasilar atelectatic change. 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 No bowel obstruction. No abscess. Gallbladder mildly distended without wall thickening. There is mild periportal edema. Etiology for this periportal edema uncertain. This finding potentially could indicate a degree of underlying hepatitis. No renal or ureteral calculus. No hydronephrosis. There is wall thickening in the anterior aspect of the urinary bladder. A degree of cystitis is questioned. There are several small prostatic calculi. Prior hernia repairs. Appendix appears normal. Electronically Signed   By: Lowella Grip III M.D.   On: 03/19/2016 08:13    Anti-infectives: Anti-infectives    None      Assessment/Plan: BIliary pancreatitis -improving -plan for OR tomorrow if con't to do well -Con't NPO     Rosario Jacks., Anne Hahn 03/20/2016

## 2016-03-21 ENCOUNTER — Encounter (HOSPITAL_COMMUNITY)
Admission: EM | Disposition: A | Discharge status: Home / Self Care | Payer: Self-pay | Source: Home / Self Care | Attending: Family Medicine

## 2016-03-21 ENCOUNTER — Encounter (HOSPITAL_COMMUNITY): Payer: Self-pay

## 2016-03-21 ENCOUNTER — Inpatient Hospital Stay (HOSPITAL_COMMUNITY): Payer: Federal, State, Local not specified - PPO | Admitting: Certified Registered Nurse Anesthetist

## 2016-03-21 ENCOUNTER — Inpatient Hospital Stay (HOSPITAL_COMMUNITY): Payer: Federal, State, Local not specified - PPO

## 2016-03-21 DIAGNOSIS — K859 Acute pancreatitis without necrosis or infection, unspecified: Secondary | ICD-10-CM

## 2016-03-21 HISTORY — PX: CHOLECYSTECTOMY: SHX55

## 2016-03-21 HISTORY — DX: Acute pancreatitis without necrosis or infection, unspecified: K85.90

## 2016-03-21 LAB — COMPREHENSIVE METABOLIC PANEL
ALT: 28 U/L (ref 17–63)
AST: 26 U/L (ref 15–41)
Albumin: 3.3 g/dL — ABNORMAL LOW (ref 3.5–5.0)
Alkaline Phosphatase: 97 U/L (ref 38–126)
Anion gap: 3 — ABNORMAL LOW (ref 5–15)
BUN: 8 mg/dL (ref 6–20)
CHLORIDE: 109 mmol/L (ref 101–111)
CO2: 29 mmol/L (ref 22–32)
CREATININE: 1.28 mg/dL — AB (ref 0.61–1.24)
Calcium: 8.4 mg/dL — ABNORMAL LOW (ref 8.9–10.3)
GFR calc Af Amer: >60 mL/min (ref >60)
GFR calc non Af Amer: >60 mL/min (ref >60)
GLUCOSE: 121 mg/dL — AB (ref 65–99)
Potassium: 4.6 mmol/L (ref 3.5–5.1)
SODIUM: 141 mmol/L (ref 135–145)
Total Bilirubin: 0.5 mg/dL (ref 0.3–1.2)
Total Protein: 5.6 g/dL — ABNORMAL LOW (ref 6.5–8.1)

## 2016-03-21 LAB — CBC WITH DIFFERENTIAL/PLATELET
BASOS ABS: 0 10*3/uL (ref 0.0–0.1)
BASOS PCT: 1 %
Eosinophils Absolute: 0 10*3/uL (ref 0.0–0.7)
Eosinophils Relative: 0 %
HEMATOCRIT: 39.3 % (ref 39.0–52.0)
HEMOGLOBIN: 13.1 g/dL (ref 13.0–17.0)
LYMPHS ABS: 0.5 10*3/uL — AB (ref 0.7–4.0)
LYMPHS PCT: 30 %
MCH: 31.6 pg (ref 26.0–34.0)
MCHC: 33.3 g/dL (ref 30.0–36.0)
MCV: 94.7 fL (ref 78.0–100.0)
MONOS PCT: 7 %
Monocytes Absolute: 0.1 10*3/uL (ref 0.1–1.0)
NEUTROS ABS: 1.2 10*3/uL — AB (ref 1.7–7.7)
Neutrophils Relative %: 62 %
PLATELETS: 62 10*3/uL — AB (ref 150–400)
RBC: 4.15 MIL/uL — ABNORMAL LOW (ref 4.22–5.81)
RDW: 13.4 % (ref 11.5–15.5)
WBC: 1.8 10*3/uL — ABNORMAL LOW (ref 4.0–10.5)

## 2016-03-21 LAB — VITAMIN D 25 HYDROXY (VIT D DEFICIENCY, FRACTURES): VIT D 25 HYDROXY: 24 ng/mL — AB (ref 30.0–100.0)

## 2016-03-21 LAB — LIPASE, BLOOD: Lipase: 80 U/L — ABNORMAL HIGH (ref 11–51)

## 2016-03-21 LAB — SURGICAL PCR SCREEN
MRSA, PCR: NEGATIVE
Staphylococcus aureus: NEGATIVE

## 2016-03-21 SURGERY — LAPAROSCOPIC CHOLECYSTECTOMY WITH INTRAOPERATIVE CHOLANGIOGRAM
Anesthesia: General

## 2016-03-21 MED ORDER — SUGAMMADEX SODIUM 200 MG/2ML IV SOLN
INTRAVENOUS | Status: AC
Start: 2016-03-21 — End: 2016-03-21
  Filled 2016-03-21: qty 2

## 2016-03-21 MED ORDER — LIDOCAINE HCL (CARDIAC) 20 MG/ML IV SOLN
INTRAVENOUS | Status: DC | PRN
  Administered 2016-03-21: 100 mg via INTRAVENOUS

## 2016-03-21 MED ORDER — IOPAMIDOL (ISOVUE-300) INJECTION 61%
INTRAVENOUS | Status: DC | PRN
  Administered 2016-03-21: 15 mL

## 2016-03-21 MED ORDER — FENTANYL CITRATE (PF) 100 MCG/2ML IJ SOLN
INTRAMUSCULAR | Status: DC | PRN
  Administered 2016-03-21 (×5): 50 ug via INTRAVENOUS

## 2016-03-21 MED ORDER — ONDANSETRON 4 MG PO TBDP: 4.0000 mg | ORAL_TABLET | Freq: Four times a day (QID) | ORAL | Status: DC | PRN

## 2016-03-21 MED ORDER — LACTATED RINGERS IV SOLN
INTRAVENOUS | Status: DC
  Administered 2016-03-21: 11:00:00 via INTRAVENOUS
  Administered 2016-03-21: 1000 mL via INTRAVENOUS

## 2016-03-21 MED ORDER — CEFAZOLIN SODIUM-DEXTROSE 2-4 GM/100ML-% IV SOLN
INTRAVENOUS | Status: AC
Start: 2016-03-21 — End: 2016-03-21
  Filled 2016-03-21: qty 100

## 2016-03-21 MED ORDER — LACTATED RINGERS IR SOLN
Status: DC | PRN
  Administered 2016-03-21: 1000 mL

## 2016-03-21 MED ORDER — SUGAMMADEX SODIUM 200 MG/2ML IV SOLN
INTRAVENOUS | Status: DC | PRN
  Administered 2016-03-21: 200 mg via INTRAVENOUS

## 2016-03-21 MED ORDER — BUPIVACAINE-EPINEPHRINE (PF) 0.25% -1:200000 IJ SOLN
INTRAMUSCULAR | Status: DC | PRN
  Administered 2016-03-21: 14 mL

## 2016-03-21 MED ORDER — HYDROMORPHONE HCL 1 MG/ML IJ SOLN
0.2500 mg | INTRAMUSCULAR | Status: DC | PRN
  Administered 2016-03-21 (×2): 0.5 mg via INTRAVENOUS

## 2016-03-21 MED ORDER — HYDROMORPHONE HCL 1 MG/ML IJ SOLN
INTRAMUSCULAR | Status: AC
  Filled 2016-03-21: qty 1

## 2016-03-21 MED ORDER — OXYCODONE-ACETAMINOPHEN 5-325 MG PO TABS
1.0000 | ORAL_TABLET | ORAL | Status: DC | PRN
  Administered 2016-03-21 – 2016-03-22 (×5): 2 via ORAL
  Filled 2016-03-21 (×5): qty 2

## 2016-03-21 MED ORDER — ONDANSETRON HCL 4 MG/2ML IJ SOLN: 4.0000 mg | Freq: Four times a day (QID) | INTRAMUSCULAR | Status: DC | PRN

## 2016-03-21 MED ORDER — ONDANSETRON HCL 4 MG/2ML IJ SOLN
INTRAMUSCULAR | Status: DC | PRN
  Administered 2016-03-21: 4 mg via INTRAVENOUS

## 2016-03-21 MED ORDER — PROPOFOL 10 MG/ML IV BOLUS
INTRAVENOUS | Status: AC
  Filled 2016-03-21: qty 20

## 2016-03-21 MED ORDER — 0.9 % SODIUM CHLORIDE (POUR BTL) OPTIME
TOPICAL | Status: DC | PRN
  Administered 2016-03-21: 1000 mL

## 2016-03-21 MED ORDER — PHENYLEPHRINE HCL 10 MG/ML IJ SOLN
INTRAMUSCULAR | Status: DC | PRN
  Administered 2016-03-21 (×2): 80 ug via INTRAVENOUS

## 2016-03-21 MED ORDER — BUPIVACAINE-EPINEPHRINE 0.25% -1:200000 IJ SOLN
INTRAMUSCULAR | Status: AC
  Filled 2016-03-21: qty 1

## 2016-03-21 MED ORDER — LIDOCAINE HCL (CARDIAC) 20 MG/ML IV SOLN
INTRAVENOUS | Status: AC
Start: 2016-03-21 — End: 2016-03-21
  Filled 2016-03-21: qty 5

## 2016-03-21 MED ORDER — SODIUM CHLORIDE 0.9 % IV BOLUS (SEPSIS)
500.0000 mL | Freq: Once | INTRAVENOUS | Status: AC
  Administered 2016-03-21: 500 mL via INTRAVENOUS

## 2016-03-21 MED ORDER — MIDAZOLAM HCL 5 MG/5ML IJ SOLN
INTRAMUSCULAR | Status: DC | PRN
  Administered 2016-03-21: 2 mg via INTRAVENOUS

## 2016-03-21 MED ORDER — PROPOFOL 10 MG/ML IV BOLUS
INTRAVENOUS | Status: DC | PRN
  Administered 2016-03-21: 200 mg via INTRAVENOUS

## 2016-03-21 MED ORDER — IOPAMIDOL (ISOVUE-300) INJECTION 61%
INTRAVENOUS | Status: AC
  Filled 2016-03-21: qty 50

## 2016-03-21 MED ORDER — MIDAZOLAM HCL 2 MG/2ML IJ SOLN
INTRAMUSCULAR | Status: AC
  Filled 2016-03-21: qty 2

## 2016-03-21 MED ORDER — ACETAMINOPHEN 325 MG PO TABS
325.0000 mg | ORAL_TABLET | Freq: Once | ORAL | Status: AC
  Administered 2016-03-21: 325 mg via ORAL
  Filled 2016-03-21: qty 1

## 2016-03-21 MED ORDER — FENTANYL CITRATE (PF) 250 MCG/5ML IJ SOLN
INTRAMUSCULAR | Status: AC
  Filled 2016-03-21: qty 5

## 2016-03-21 MED ORDER — ERGOCALCIFEROL 8000 UNIT/ML PO SOLN
50000.0000 [IU] | Freq: Once | ORAL | Status: AC
  Administered 2016-03-21: 50000 [IU] via ORAL
  Filled 2016-03-21: qty 6.25

## 2016-03-21 MED ORDER — ROCURONIUM BROMIDE 100 MG/10ML IV SOLN
INTRAVENOUS | Status: DC | PRN
  Administered 2016-03-21: 50 mg via INTRAVENOUS

## 2016-03-21 MED ORDER — SUCCINYLCHOLINE CHLORIDE 20 MG/ML IJ SOLN
INTRAMUSCULAR | Status: DC | PRN
  Administered 2016-03-21: 100 mg via INTRAVENOUS

## 2016-03-21 MED ORDER — PROMETHAZINE HCL 25 MG/ML IJ SOLN: 6.2500 mg | INTRAMUSCULAR | Status: DC | PRN

## 2016-03-21 MED ORDER — ROCURONIUM BROMIDE 100 MG/10ML IV SOLN
INTRAVENOUS | Status: AC
  Filled 2016-03-21: qty 1

## 2016-03-21 MED ORDER — CEFAZOLIN SODIUM-DEXTROSE 2-3 GM-% IV SOLR
INTRAVENOUS | Status: DC | PRN
  Administered 2016-03-21: 2 g via INTRAVENOUS

## 2016-03-21 MED ORDER — ONDANSETRON HCL 4 MG/2ML IJ SOLN
INTRAMUSCULAR | Status: AC
Start: 2016-03-21 — End: 2016-03-21
  Filled 2016-03-21: qty 2

## 2016-03-21 MED ORDER — DEXAMETHASONE SODIUM PHOSPHATE 10 MG/ML IJ SOLN
INTRAMUSCULAR | Status: DC | PRN
  Administered 2016-03-21: 5 mg via INTRAVENOUS

## 2016-03-21 SURGICAL SUPPLY — 43 items
APL SKNCLS STERI-STRIP NONHPOA (GAUZE/BANDAGES/DRESSINGS) ×1
APPLIER CLIP 5 13 M/L LIGAMAX5 (MISCELLANEOUS) ×2
APR CLP MED LRG 5 ANG JAW (MISCELLANEOUS) ×1
BAG SPEC RTRVL 10 TROC 200 (ENDOMECHANICALS) ×1
BENZOIN TINCTURE PRP APPL 2/3 (GAUZE/BANDAGES/DRESSINGS) ×2 IMPLANT
CABLE HIGH FREQUENCY MONO STRZ (ELECTRODE) ×1 IMPLANT
CHLORAPREP W/TINT 26ML (MISCELLANEOUS) ×2 IMPLANT
CLIP APPLIE 5 13 M/L LIGAMAX5 (MISCELLANEOUS) IMPLANT
CLIP LIGATING HEMO O LOK GREEN (MISCELLANEOUS) ×2 IMPLANT
COVER MAYO STAND STRL (DRAPES) IMPLANT
COVER SURGICAL LIGHT HANDLE (MISCELLANEOUS) ×2 IMPLANT
COVER TRANSDUCER ULTRASND (DRAPES) ×2 IMPLANT
DECANTER SPIKE VIAL GLASS SM (MISCELLANEOUS) ×2 IMPLANT
DEVICE TROCAR PUNCTURE CLOSURE (ENDOMECHANICALS) ×2 IMPLANT
DRAPE C-ARM 42X120 X-RAY (DRAPES) IMPLANT
DRAPE LAPAROSCOPIC ABDOMINAL (DRAPES) IMPLANT
DRSG TEGADERM 2-3/8X2-3/4 SM (GAUZE/BANDAGES/DRESSINGS) ×3 IMPLANT
ELECT REM PT RETURN 9FT ADLT (ELECTROSURGICAL) ×2
ELECTRODE REM PT RTRN 9FT ADLT (ELECTROSURGICAL) ×1 IMPLANT
GAUZE SPONGE 2X2 8PLY STRL LF (GAUZE/BANDAGES/DRESSINGS) ×1 IMPLANT
GAUZE SPONGE 4X4 12PLY STRL (GAUZE/BANDAGES/DRESSINGS) ×2 IMPLANT
GLOVE BIO SURGEON STRL SZ7.5 (GLOVE) ×2 IMPLANT
GOWN STRL REUS W/TWL XL LVL3 (GOWN DISPOSABLE) ×6 IMPLANT
KIT BASIN OR (CUSTOM PROCEDURE TRAY) ×2 IMPLANT
NEEDLE INSUFFLATION 14GA 120MM (NEEDLE) ×2 IMPLANT
NS IRRIG 1000ML POUR BTL (IV SOLUTION) ×2 IMPLANT
POSITIONER SURGICAL ARM (MISCELLANEOUS) IMPLANT
POUCH RETRIEVAL ECOSAC 10 (ENDOMECHANICALS) ×1 IMPLANT
POUCH RETRIEVAL ECOSAC 10MM (ENDOMECHANICALS) ×1
RINGERS IRRIG 1000ML POUR BTL (IV SOLUTION) ×1 IMPLANT
SCISSORS LAP 5X35 DISP (ENDOMECHANICALS) ×2 IMPLANT
SET CHOLANGIOGRAPH MIX (MISCELLANEOUS) ×1 IMPLANT
SET IRRIG TUBING LAPAROSCOPIC (IRRIGATION / IRRIGATOR) ×2 IMPLANT
SLEEVE XCEL OPT CAN 5 100 (ENDOMECHANICALS) ×2 IMPLANT
SPONGE GAUZE 2X2 STER 10/PKG (GAUZE/BANDAGES/DRESSINGS) ×1
STRIP CLOSURE SKIN 1/2X4 (GAUZE/BANDAGES/DRESSINGS) ×2 IMPLANT
SUT MNCRL AB 4-0 PS2 18 (SUTURE) ×2 IMPLANT
TAPE CLOTH 4X10 WHT NS (GAUZE/BANDAGES/DRESSINGS) IMPLANT
TOWEL OR 17X26 10 PK STRL BLUE (TOWEL DISPOSABLE) ×2 IMPLANT
TRAY LAPAROSCOPIC (CUSTOM PROCEDURE TRAY) ×2 IMPLANT
TROCAR BLADELESS OPT 5 100 (ENDOMECHANICALS) ×2 IMPLANT
TROCAR XCEL NON-BLD 11X100MML (ENDOMECHANICALS) ×2 IMPLANT
TUBING INSUF HEATED (TUBING) ×2 IMPLANT

## 2016-03-21 NOTE — Progress Notes (Addendum)
PROGRESS NOTE   Hector [name]  [id number]  DOB: 12-Jul-1961  DOA: 03/19/2016 PCP: Lottie Dawson, MD Outpatient Specialists:  Hospital course:  HPI: Hector [name] is a 55 y.o. male with medical history significant of gastric bypass 2011, gastric ulcer perforation, who presents complaining of abdominal pain, mid abdomen, started early this morning, grabbing, heaviness quality, severe, associated with nausea. He denies vomiting, chills. He had 2 watery stool day prior to admission. He denies drinking alcohol.  ED Course: presents with abdominal pain, lipase 762, AST 58, normal renal function and electrolytes, WBC 3.7. CT abdomen: Patchy infiltrate lateral left base region. There is also bibasilar atelectatic change. Gallbladder mildly distended without wall thickening. There is mild periportal edema. Etiology for this periportal edema uncertain. This finding potentially could indicate a degree of underlying hepatitis.There is wall thickening in the anterior aspect of the urinary bladder. A degree of cystitis is questioned. There are several small prostatic calculi. [place]: gallstone.   Assessment & Plan:   Acute gallstone pancreatitis / cholecystitis - appreciate surgery assistance, POD#0 s/p Cholecystectomy, post op care per surgery team. 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 Give drisdol 50000 IU x1.   DVT prophylaxis: lovenox.  Code Status: full code.  Family Communication: care discussed with patient.  Disposition Plan: home at time of discharge, likely tomorrow 7/8 Consults called: general surgery  Admission status: observation, med-surgery   Consultants:  General surgery  Procedures:  Cholecystectomy 03/21/16  Rosendo Gros)  Antimicrobials: Anti-infectives    Start     Dose/Rate Route Frequency Ordered Stop   03/20/16 0900  ceFAZolin (ANCEF) powder 2 g     2 g Other On call to O.R. 03/20/16 0845 03/21/16 0559     Subjective: Pt seen recently post-op from cholecystectomy.   Objective: Filed Vitals:   03/21/16 1030 03/21/16 1045 03/21/16 1052 03/21/16 1104  BP: 143/89 127/79 129/87 129/73  Pulse: 63 62  67  Temp:   98.1 F (36.7 C) 98.9 F (37.2 C)  TempSrc:      Resp: 13 12  16   Height:      Weight:      SpO2: 100% 98%  97%    Intake/Output Summary (Last 24 hours) at 03/21/16 1209 Last data filed at 03/21/16 1141  Gross per 24 hour  Intake 3721.25 ml  Output   3180 ml  Net 541.25 ml   Filed Weights   03/19/16 0553  Weight: 188 lb (85.276 kg)   Exam:  General exam: awake, alert, no distress, cooperative.  Respiratory system: Clear. No increased work of breathing. Cardiovascular system: S1 & S2 heard, RRR. No JVD, murmurs, gallops, clicks or pedal edema. Gastrointestinal system: Abdomen is nondistended. Wound c/d/i.  bowel sounds heard. Central nervous system: Alert and oriented. No focal neurological deficits. Extremities: no cyanosis or pretibial edema.  Data Reviewed: Basic Metabolic Panel:  Recent Labs Lab 03/19/16 0619 03/20/16 0452 03/21/16 0441  NA 142 139 141  K 3.9 4.3 4.6  CL 106 105 109  CO2 27 29 29   GLUCOSE 129* 122* 121*  BUN 9 10 8   CREATININE 1.24 1.20 1.28*  CALCIUM 8.9 8.5* 8.4*   Liver Function Tests:  Recent Labs Lab 03/19/16 0619 03/20/16 0452 03/21/16 0441  AST 58* 48* 26  ALT 20  43 28  ALKPHOS 77 113 97  BILITOT 1.0 1.0 0.5  PROT 6.6 5.8* 5.6*  ALBUMIN 4.4 3.7 3.3*    Recent Labs Lab 03/19/16 0619 03/20/16 0452 03/21/16 0441  LIPASE 762* 133* 80*   No results for input(s): AMMONIA in the last 168 hours. CBC:  Recent Labs Lab 03/19/16 0619 03/20/16 0452 03/21/16 0441  WBC 3.7* 4.9 1.8*  NEUTROABS 1.9  --  1.2*    HGB 15.2 14.3 13.1  HCT 46.2 42.9 39.3  MCV 95.9 96.4 94.7  PLT 132* 97* 62*   Cardiac Enzymes: No results for input(s): CKTOTAL, CKMB, CKMBINDEX, TROPONINI in the last 168 hours. BNP (last 3 results) No results for input(s): PROBNP in the last 8760 hours. CBG: No results for input(s): GLUCAP in the last 168 hours.  Recent Results (from the past 240 hour(s))  Surgical PCR screen     Status: None   Collection Time: 03/20/16 10:10 AM  Result Value Ref Range Status   MRSA, PCR NEGATIVE NEGATIVE Final   Staphylococcus aureus NEGATIVE NEGATIVE Final    Comment:        The Xpert SA Assay (FDA approved for NASAL specimens in patients over 95 years of age), is one component of a comprehensive surveillance program.  Test performance has been validated by [facility] for patients greater than or equal to 58 year old. It is not intended to diagnose infection nor to guide or monitor treatment.   Surgical pcr screen     Status: None   Collection Time: 03/21/16  8:21 AM  Result Value Ref Range Status   MRSA, PCR NEGATIVE NEGATIVE Final   Staphylococcus aureus NEGATIVE NEGATIVE Final    Comment:        The Xpert SA Assay (FDA approved for NASAL specimens in patients over 57 years of age), is one component of a comprehensive surveillance program.  Test performance has been validated by [facility] for patients greater than or equal to 59 year old. It is not intended to diagnose infection nor to guide or monitor treatment.      Studies: Dg Cholangiogram Operative  03/21/2016  CLINICAL DATA:  Cholelithiasis EXAM: INTRAOPERATIVE CHOLANGIOGRAM TECHNIQUE: Cholangiographic images from the C-arm fluoroscopic device were submitted for interpretation post-operatively. Please see the procedural report for the amount of contrast and the fluoroscopy time utilized. COMPARISON:  Ultrasound 03/19/2016 FINDINGS: No persistent filling defects in the common duct. Intrahepatic ducts are  incompletely visualized, appearing decompressed centrally. Contrast passes into the duodenum. : Negative for retained common duct stone. Electronically Signed   By: Lucrezia Europe M.D.   On: 03/21/2016 11:02   Scheduled Meds: . docusate sodium  100 mg Oral BID  . escitalopram  20 mg Oral q morning - 10a  . HYDROmorphone      . lamoTRIgine  100 mg Oral QHS  . lamoTRIgine  200 mg Oral Daily  . traZODone  300 mg Oral QHS   Continuous Infusions: . 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

## 2016-03-21 NOTE — Progress Notes (Signed)
Subjective: Pt doing well.  Pain from last night has subsided. Ready for surgery this AM.  Objective: Vital signs in last 24 hours: Temp:  [98.9 F (37.2 C)-103.1 F (39.5 C)] 99.3 F (37.4 C) (07/07 0459) Pulse Rate:  [67-99] 67 (07/07 0459) Resp:  [16-20] 16 (07/07 0459) BP: (131-156)/(67-114) 131/67 mmHg (07/07 0459) SpO2:  [93 %-96 %] 96 % (07/07 0459) Last BM Date: 03/18/16  Intake/Output from previous day: 07/06 0701 - 07/07 0700 In: 2821.3 [P.O.:240; I.V.:2581.3] Out: 2275 [Urine:2275] Intake/Output this shift:    General appearance: alert and cooperative GI: soft, non-tender; bowel sounds normal; no masses,  no organomegaly  Lab Results:   Recent Labs  03/20/16 0452 03/21/16 0441  WBC 4.9 1.8*  HGB 14.3 13.1  HCT 42.9 39.3  PLT 97* 62*   BMET  Recent Labs  03/20/16 0452 03/21/16 0441  NA 139 141  K 4.3 4.6  CL 105 109  CO2 29 29  GLUCOSE 122* 121*  BUN 10 8  CREATININE 1.20 1.28*  CALCIUM 8.5* 8.4*   PT/INR  Recent Labs  03/20/16 0452  LABPROT 14.1  INR 1.07   ABG No results for input(s): PHART, HCO3 in the last 72 hours.  Invalid input(s): PCO2, PO2  Studies/Results: US Abdomen Complete  03/19/2016  CLINICAL DATA:  Abdominal pain starting 4 a.m. in the morning EXAM: ABDOMEN ULTRASOUND COMPLETE COMPARISON:  CT scan 03/19/2016 FINDINGS: Gallbladder: Mild distended gallbladder. Gallstones are noted within gallbladder the largest measures 5.2 mm. No thickening of gallbladder wall. No sonographic Murphy's sign. Small gallbladder sludge. Common bile duct: Diameter: 5.7 mm in diameter within normal limits. Liver: No focal lesion identified. Within normal limits in parenchymal echogenicity. IVC: No abnormality visualized. Pancreas: Limited assessment due to bowel gas. Spleen: Size and appearance within normal limits. Measures 10.9 cm in length Right Kidney: Length: 11.8 cm. Normal echogenicity. No hydronephrosis. There is a lower pole central  cyst measures 1.6 x 1.1 cm. Left Kidney: Length: 12 cm. Echogenicity within normal limits. No mass or hydronephrosis visualized. Abdominal aorta: No aneurysm visualized. Measures up to 2.4 cm in diameter. Other findings: None. IMPRESSION: 1. Sludge and gallstones are noted within gallbladder. The largest measures 5.2 mm. No thickening of gallbladder wall. No sonographic Murphy's sign. 2. Normal CBD. 3. No aortic aneurysm. 4. No hydronephrosis. There is a central cyst in lower pole of the right kidney measures 1.6 cm . Electronically Signed   By: Lahoma Crocker M.D.   On: 03/19/2016 09:17   Ct Abdomen Pelvis W Contrast  03/19/2016  CLINICAL DATA:  Abdominal pain for 1 day. EXAM: CT ABDOMEN AND PELVIS WITH CONTRAST TECHNIQUE: Multidetector CT imaging of the abdomen and pelvis was performed using the standard protocol following bolus administration of intravenous contrast. Oral contrast was also administered. CONTRAST:  118mL ISOVUE-300 IOPAMIDOL (ISOVUE-300) INJECTION 61% COMPARISON:  July 14, 2014 FINDINGS: Lower chest: There is patchy airspace consolidation in the lateral segment left lower lobe. There is also bibasilar atelectasis more posteriorly. Hepatobiliary: No focal liver lesions are identified. There is mild periportal edema. Gallbladder is mildly distended without appreciable wall thickening. Hepatic and portal veins are patent. There is no biliary duct dilatation. Pancreas: No pancreatic mass or inflammatory focus. Spleen: No splenic lesions are evident. Adrenals/Urinary Tract: Adrenals appear normal bilaterally. There are parapelvic cysts in both kidneys. The largest parapelvic cyst is on the left, measuring 2.5 x 1.7 cm. There is no hydronephrosis on either side. There is no appreciable renal or ureteral  calculus on either side. Urinary bladder is midline. There is thickening of the anterior wall of the urinary bladder focally. Stomach/Bowel: The patient is status post gastric bypass procedure without  wall thickening or fluid in the postoperative area. The appearance in this area stable compared to prior study. There is no appreciable bowel wall or mesenteric thickening. There is no bowel obstruction. No free air or portal venous air. Vascular/Lymphatic: There is no abdominal aortic aneurysm. No vascular lesions are evident on this study. There is no appreciable adenopathy in the abdomen or pelvis. Reproductive: There are several small prostatic calcifications. Prostate and seminal vesicles appear normal in size and contour. There is no pelvic mass or pelvic fluid collection. Other: Appendix appears unremarkable. There is no demonstrable abscess or ascites in the abdomen or pelvis. Patient has had previous of ventral hernia repair. Suspect previous inguinal hernia repairs as well. Musculoskeletal: Postoperative changes noted in the lower lumbar region. There are no blastic or lytic bone lesions. There is no intramuscular or abdominal wall lesion. IMPRESSION: Patchy infiltrate lateral left base region. There is also bibasilar atelectatic change. 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 No bowel obstruction. No abscess. Gallbladder mildly distended without wall thickening. There is mild periportal edema. Etiology for this periportal edema uncertain. This finding potentially could indicate a degree of underlying hepatitis. No renal or ureteral calculus. No hydronephrosis. There is wall thickening in the anterior aspect of the urinary bladder. A degree of cystitis is questioned. There are several small prostatic calculi. Prior hernia repairs. Appendix appears normal. Electronically Signed   By: Lowella Grip III M.D.   On: 03/19/2016 08:13    Anti-infectives: Anti-infectives    Start     Dose/Rate Route Frequency Ordered Stop   03/20/16 0900  ceFAZolin (ANCEF) powder 2 g     2 g Other On call to O.R. 03/20/16 0845 03/21/16 0559      Assessment/Plan: Biliary  pancreatitis -Plan for OR today -All risks and benefits were discussed with the patient to generally include: infection, bleeding, possible need for post op ERCP, damage to the bile ducts, and bile leak. Alternatives were offered and described.  All questions were answered and the patient voiced understanding of the procedure and wishes to proceed at this point with a laparoscopic cholecystectomy    LOS: 1 day    Rosario Jacks., Anne Hahn 03/21/2016

## 2016-03-21 NOTE — Anesthesia Procedure Notes (Signed)
Procedure Name: Intubation Date/Time: 03/21/2016 9:11 AM Performed by: Montel Clock Pre-anesthesia Checklist: Patient identified, Emergency Drugs available, Suction available, Patient being monitored and Timeout performed Patient Re-evaluated:Patient Re-evaluated prior to inductionOxygen Delivery Method: Circle system utilized Preoxygenation: Pre-oxygenation with 100% oxygen Intubation Type: IV induction and Rapid sequence Laryngoscope Size: Mac and 3 Grade View: Grade II Tube type: Oral Tube size: 7.5 mm Number of attempts: 1 Airway Equipment and Method: Stylet Placement Confirmation: ETT inserted through vocal cords under direct vision,  positive ETCO2 and breath sounds checked- equal and bilateral Secured at: 23 cm Tube secured with: Tape Dental Injury: Teeth and Oropharynx as per pre-operative assessment

## 2016-03-21 NOTE — Transfer of Care (Signed)
Immediate Anesthesia Transfer of Care Note  Patient: Hector [name]  Procedure(s) Performed: Procedure(s): LAPAROSCOPIC CHOLECYSTECTOMY WITH INTRAOPERATIVE CHOLANGIOGRAM (N/A)  Patient Location: PACU  Anesthesia Type:General  Level of Consciousness:  sedated, patient cooperative and responds to stimulation  Airway & Oxygen Therapy:Patient Spontanous Breathing and Patient connected to face mask oxgen  Post-op Assessment:  Report given to PACU RN and Post -op Vital signs reviewed and stable  Post vital signs:  Reviewed and stable  Last Vitals:  Filed Vitals:   03/21/16 0459 03/21/16 0814  BP: 131/67 163/101  Pulse: 67 83  Temp: 37.4 C 38.3 C  Resp: 16 16    Complications: No apparent anesthesia complications

## 2016-03-21 NOTE — Progress Notes (Signed)
Notified K.Schorr NP of [facility] about pt oral temp of 103.1. 650mg  of oral Tylenol given at 2338, ordered additional 325mg  of tylenol and 500cc NS bolus. Will continue to monitor.

## 2016-03-21 NOTE — Anesthesia Preprocedure Evaluation (Addendum)
Anesthesia Evaluation  Patient identified by MRN, date of birth, ID band Patient awake    Reviewed: Allergy & Precautions, H&P , NPO status , Patient's Chart, lab work & pertinent test results  Airway Mallampati: II  TM Distance: >3 FB Neck ROM: Full    Dental no notable dental hx.    Pulmonary asthma , sleep apnea ,    Pulmonary exam normal breath sounds clear to auscultation       Cardiovascular negative cardio ROS Normal cardiovascular exam Rhythm:Regular Rate:Normal     Neuro/Psych  Headaches, PSYCHIATRIC DISORDERS Anxiety  Neuromuscular disease    GI/Hepatic Neg liver ROS, PUD, GERD  ,  Endo/Other  diabetes, Type 2  Renal/GU negative Renal ROS  negative genitourinary   Musculoskeletal  (+) Arthritis ,   Abdominal   Peds negative pediatric ROS (+)  Hematology negative hematology ROS (+)   Anesthesia Other Findings clinically he looks great, much improved from initial admission, denies cardiac or pulmonary symptoms  Dr. Rosendo Gros aware of platelets of 62,000 and we will watch closely in case PLT concentrate needed intraop.. No lovenox today and will hold for rest of hospital stay, will follow labs and re-evaluated post op.. Does have chronic thrombocytopenia but usual range is 130s  Reproductive/Obstetrics negative OB ROS                            Anesthesia Physical  Anesthesia Plan  ASA: III  Anesthesia Plan: General   Post-op Pain Management:    Induction: Intravenous  Airway Management Planned: Oral ETT  Additional Equipment:   Intra-op Plan:   Post-operative Plan: Extubation in OR  Informed Consent: I have reviewed the patients History and Physical, chart, labs and discussed the procedure including the risks, benefits and alternatives for the proposed anesthesia with the patient or authorized representative who has indicated his/her understanding and acceptance.    Dental advisory given  Plan Discussed with: CRNA  Anesthesia Plan Comments:         Anesthesia Quick Evaluation

## 2016-03-21 NOTE — Op Note (Signed)
03/21/2016  10:02 AM  PATIENT:  Hector [name]  55 y.o. male  PRE-OPERATIVE DIAGNOSIS:  Cholelithiasis, biliary pancreatitis  POST-OPERATIVE DIAGNOSIS:  same  PROCEDURE:  Procedure(s): LAPAROSCOPIC CHOLECYSTECTOMY WITH INTRAOPERATIVE CHOLANGIOGRAM (N/A)  SURGEON:  Surgeon(s) and Role:    * Ralene Ok, MD - Primary  ANESTHESIA:   local and general   EBL: 5cc Total I/O In: -  Out: 400 [Urine:400]  BLOOD ADMINISTERED:none  DRAINS: none   LOCAL MEDICATIONS USED:  BUPIVICAINE   SPECIMEN:  Source of Specimen:  gallbladder  DISPOSITION OF SPECIMEN:  PATHOLOGY  COUNTS:  YES  TOURNIQUET:  * No tourniquets in log *  DICTATION: .Dragon Dictation  EBL: [id number]   Complications: none   Counts: reported as correct x 2   Findings:chronic inflammation of the gallbladder with gallstones  Indications for procedure: Pt is a 55 y/o M with RUQ pain and seen to have gallstones and pancreatitis  Details of the procedure: The patient was taken to the operating and placed in the supine position with bilateral SCDs in place. A time out was called and all facts were verified. A pneumoperitoneum was obtained via A Veress needle technique to a pressure of 42mm of mercury. A 61mm trochar was then placed in the right upper quadrant under visualization, and there were no injuries to any abdominal organs. A 11 mm port was then placed in the umbilical region after infiltrating with local anesthesia under direct visualization. A second epigastric port was placed under direct visualization.   The gallbladder was identified and retracted, the peritoneum was then sharply dissected from the gallbladder and this dissection was carried down to Calot's triangle. The cystic duct was identified and stripped away circumferentially and seen going into the gallbladder 360, the critical angle was obtained.  A Cook catheter was used to perform an intraoperative cholangiogram. The cystic duct and common bile duct were  seen free of filling defects.   2 clips were placed proximally one distally and the cystic duct transected. The cystic artery was identified and 2 clips placed proximally and one distally and transected. We then proceeded to remove the gallbladder off the hepatic fossa with Bovie cautery. A retrieval bag was then placed in the abdomen and gallbladder placed in the bag. The hepatic fossa was then reexamined and hemostasis was achieved with Bovie cautery and was excellent at this portion of the case. The subhepatic fossa and perihepatic fossa was then irrigated until the effluent was clear. The specimen bag and specimen were removed from the abdominal cavity.  The 11 mm trocar fascia was reapproximated with the Endo Close #1 Vicryl x1. The pneumoperitoneum was evacuated and all trochars removed under direct visulalization. The skin was then closed with 4-0 Monocryl and the skin dressed with Steri-Strips, gauze, and tape. The patient was awaken from general anesthesia and taken to the recovery room in stable condition.   PLAN OF CARE: Admit for overnight observation  PATIENT DISPOSITION:  PACU - hemodynamically stable.   Delay start of Pharmacological VTE agent (>24hrs) due to surgical blood loss or risk of bleeding: not applicable

## 2016-03-21 NOTE — Anesthesia Postprocedure Evaluation (Signed)
Anesthesia Post Note  Patient: Hector [name]  Procedure(s) Performed: Procedure(s) (LRB): LAPAROSCOPIC CHOLECYSTECTOMY WITH INTRAOPERATIVE CHOLANGIOGRAM (N/A)  Patient location during evaluation: PACU Anesthesia Type: General Level of consciousness: awake and alert Pain management: pain level controlled Vital Signs Assessment: post-procedure vital signs reviewed and stable Respiratory status: spontaneous breathing, nonlabored ventilation, respiratory function stable and patient connected to nasal cannula oxygen Cardiovascular status: blood pressure returned to baseline and stable Postop Assessment: no signs of nausea or vomiting Anesthetic complications: no    Last Vitals:  Filed Vitals:   03/21/16 1018 03/21/16 1030  BP: 144/91 143/89  Pulse: 73 63  Temp: 37.3 C   Resp: 11 13    Last Pain:  Filed Vitals:   03/21/16 1036  PainSc: 4                  Zenaida Deed

## 2016-03-22 ENCOUNTER — Encounter (HOSPITAL_COMMUNITY)
Admission: EM | Disposition: A | Discharge status: Home / Self Care | Payer: Self-pay | Source: Home / Self Care | Attending: Family Medicine

## 2016-03-22 DIAGNOSIS — E559 Vitamin D deficiency, unspecified: Secondary | ICD-10-CM

## 2016-03-22 LAB — COMPREHENSIVE METABOLIC PANEL
ALK PHOS: 93 U/L (ref 38–126)
ALT: 29 U/L (ref 17–63)
ANION GAP: 4 — AB (ref 5–15)
AST: 36 U/L (ref 15–41)
Albumin: 3.4 g/dL — ABNORMAL LOW (ref 3.5–5.0)
BILIRUBIN TOTAL: 0.3 mg/dL (ref 0.3–1.2)
BUN: 7 mg/dL (ref 6–20)
CALCIUM: 8.8 mg/dL — AB (ref 8.9–10.3)
CHLORIDE: 106 mmol/L (ref 101–111)
CO2: 30 mmol/L (ref 22–32)
CREATININE: 1.13 mg/dL (ref 0.61–1.24)
Glucose, Bld: 130 mg/dL — ABNORMAL HIGH (ref 65–99)
Potassium: 4.5 mmol/L (ref 3.5–5.1)
Sodium: 140 mmol/L (ref 135–145)
Total Protein: 5.8 g/dL — ABNORMAL LOW (ref 6.5–8.1)

## 2016-03-22 LAB — CBC
HEMATOCRIT: 39 % (ref 39.0–52.0)
HEMOGLOBIN: 12.9 g/dL — AB (ref 13.0–17.0)
MCH: 31.6 pg (ref 26.0–34.0)
MCHC: 33.1 g/dL (ref 30.0–36.0)
MCV: 95.6 fL (ref 78.0–100.0)
Platelets: 75 10*3/uL — ABNORMAL LOW (ref 150–400)
RBC: 4.08 MIL/uL — ABNORMAL LOW (ref 4.22–5.81)
RDW: 13.4 % (ref 11.5–15.5)
WBC: 2.6 10*3/uL — ABNORMAL LOW (ref 4.0–10.5)

## 2016-03-22 SURGERY — LAPAROSCOPIC CHOLECYSTECTOMY WITH INTRAOPERATIVE CHOLANGIOGRAM
Anesthesia: General

## 2016-03-22 MED ORDER — OXYCODONE-ACETAMINOPHEN 5-325 MG PO TABS: 2.0000 | ORAL_TABLET | Freq: Once | ORAL | Status: AC

## 2016-03-22 MED ORDER — DOCUSATE SODIUM 100 MG PO CAPS: 100.0000 mg | ORAL_CAPSULE | Freq: Two times a day (BID) | ORAL | Status: DC

## 2016-03-22 MED ORDER — HYDROMORPHONE HCL 1 MG/ML IJ SOLN: 1.0000 mg | Freq: Once | INTRAMUSCULAR | Status: DC

## 2016-03-22 MED ORDER — ONDANSETRON HCL 4 MG/2ML IJ SOLN: 4.0000 mg | Freq: Once | INTRAMUSCULAR | Status: DC

## 2016-03-22 MED ORDER — OXYCODONE-ACETAMINOPHEN 5-325 MG PO TABS: 1.0000 | ORAL_TABLET | Freq: Four times a day (QID) | ORAL | Status: DC | PRN

## 2016-03-22 NOTE — Discharge Summary (Addendum)
Physician Discharge Summary  Hector [name] [id number] DOB: [birth year] DOA: 03/19/2016  PCP: Hector Dawson, MD  Admit date: 03/19/2016 Discharge date: 03/22/2016  Recommendations for Outpatient Follow-up:  1. Follow up with General Surgeon in 2 weeks, Call them with any questions related to surgery 2. Please follow up with PCP in 2 weeks 3. Please check CBC and BMP on follow up in 2 weeks.   Discharge Condition: Stable CODE STATUS: Full   Brief/Interim Summary: HPI: Hector [name] is a 55 y.o. male with medical history significant of gastric bypass 2011, gastric ulcer perforation, who presents complaining of abdominal pain, mid abdomen, started early this morning, grabbing, heaviness quality, severe, associated with nausea. He denies vomiting, chills. He had 2 watery stool day prior to admission. He denies drinking alcohol.  ED Course: presents with abdominal pain, lipase 762, AST 58, normal renal function and electrolytes, WBC 3.7. CT abdomen: Patchy infiltrate lateral left base region. There is also bibasilar atelectatic change. Gallbladder mildly distended without wall thickening. There is mild periportal edema. Etiology for this periportal edema uncertain. This finding potentially could indicate a degree of underlying hepatitis.There is wall thickening in the anterior aspect of the urinary bladder. A degree of cystitis is questioned. There are several small prostatic calculi. [place]: gallstone.   Acute gallstone pancreatitis / cholecystitis - appreciate surgery assistance, POD#1 s/p Cholecystectomy. OK to discharge home per surgery, follow up with them in 2 weeks, call them with questions. 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 Gave  drisdol 50000 IU x1.  Follow up with PCP in 2 weeks.   DVT prophylaxis: lovenox.  Code Status: full code.  Family Communication: care discussed with patient.  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     escitalopram 20 MG tablet  Commonly known as:  LEXAPRO  Take 20 mg by mouth every morning.     lamoTRIgine 100 MG tablet  Commonly known as:  LAMICTAL  Take 100-200 mg by mouth 2 (two) times daily. Take 200 mg in the am and 100 mg qhs.     multivitamin tablet  Take 1 tablet by mouth daily.     ondansetron 4 MG tablet  Commonly known as:  ZOFRAN  Take 4 mg by mouth every 8 (eight) hours as needed for nausea or vomiting.     oxyCODONE-acetaminophen 5-325 MG tablet  Commonly known as:  PERCOCET/ROXICET  Take 1 tablet by mouth every 6 (six) hours as needed for moderate pain.     traZODone  100 MG tablet  Commonly known as:  DESYREL  Take 300 mg by mouth at bedtime.     TYLENOL ARTHRITIS PAIN 650 MG CR tablet  Generic drug:  acetaminophen  Take 650 mg by mouth every 8 (eight) hours as needed for pain.     vitamin B-12 1000 MCG tablet  Commonly known as:  CYANOCOBALAMIN  Take 1,000 mcg by mouth daily.     VITAMIN D-1000 MAX ST 1000 units tablet  Generic drug:  Cholecalciferol  Take 1,000 Units by mouth daily.            Follow-up Information    Follow up with [facility] H, MD. Schedule an appointment as soon as possible for a visit in 2 weeks.   Specialty:  General Surgery   Why:  Hospital Follow Up   Contact information:   [address] [place] [address] [phone]       Follow up with Hector Dawson, MD. Schedule an appointment as soon as possible for a visit in 3 weeks.   Specialties:  Internal Medicine, Pediatrics   Why:  Hospital Follow Up   Contact information:   [address] [place] [address] [phone]      Allergies  Allergen Reactions  . Aspirin Other (See Comments)    perforated ulcer  . Cymbalta [Duloxetine Hcl] Other (See Comments)    Over-sedating; slept over 24 hours with one dose.  Possible serotonin syndrome.  . Tape Rash    Surgical tape  . Nsaids Other (See Comments)    ulcer  . Hydrocodone Other (See Comments)    headache    Consultations:  Gen surgery   Procedures/Studies: Dg Cholangiogram Operative  03/21/2016  CLINICAL DATA:  Cholelithiasis EXAM: INTRAOPERATIVE CHOLANGIOGRAM TECHNIQUE: Cholangiographic images from the C-arm fluoroscopic device were submitted for interpretation post-operatively. Please see the procedural report for the amount of contrast and the fluoroscopy time utilized. COMPARISON:  Ultrasound 03/19/2016 FINDINGS: No persistent filling defects in the common duct. Intrahepatic ducts are incompletely visualized, appearing decompressed centrally. Contrast passes into the duodenum. : Negative for retained common duct stone. Electronically Signed   By: Hector [name] M.D.   On: 03/21/2016 11:02   US Abdomen Complete  03/19/2016  CLINICAL DATA:  Abdominal pain starting 4 a.m. in the morning EXAM: ABDOMEN ULTRASOUND COMPLETE COMPARISON:  CT scan 03/19/2016 FINDINGS: Gallbladder: Mild distended gallbladder. Gallstones are noted within gallbladder the largest measures 5.2 mm. No thickening of gallbladder wall. No  sonographic Murphy's sign. Small gallbladder sludge. Common bile duct: Diameter: 5.7 mm in diameter within normal limits. Liver: No focal lesion identified. Within normal limits in parenchymal echogenicity. IVC: No abnormality visualized. Pancreas: Limited assessment due to bowel gas. Spleen: Size and appearance within normal limits. Measures 10.9 cm in length Right Kidney: Length: 11.8 cm. Normal echogenicity. No hydronephrosis. There is a lower pole central cyst measures 1.6 x 1.1 cm. Left Kidney: Length: 12 cm. Echogenicity within normal limits. No mass or hydronephrosis visualized. Abdominal aorta: No aneurysm visualized. Measures up to 2.4 cm in diameter. Other findings: None. IMPRESSION: 1. Sludge and gallstones are noted within gallbladder. The largest measures 5.2 mm. No thickening of gallbladder wall. No sonographic Murphy's sign. 2. Normal CBD. 3. No aortic aneurysm. 4. No hydronephrosis. There is a central cyst in lower pole of the right kidney measures 1.6 cm . Electronically Signed   By: Lahoma Crocker M.D.   On: 03/19/2016 09:17   Ct Abdomen Pelvis W Contrast  03/19/2016  CLINICAL DATA:  Abdominal pain for 1 day. EXAM: CT ABDOMEN AND PELVIS WITH CONTRAST TECHNIQUE: Multidetector CT imaging of the abdomen and pelvis was performed using the standard protocol following bolus administration of intravenous contrast. Oral contrast was also administered. CONTRAST:  165mL ISOVUE-300 IOPAMIDOL (ISOVUE-300) INJECTION 61% COMPARISON:  July 14, 2014 FINDINGS: Lower chest: There is patchy airspace consolidation in the lateral segment left lower lobe. There is also bibasilar atelectasis more posteriorly. Hepatobiliary: No focal liver lesions are identified. There is mild periportal edema. Gallbladder is mildly distended without appreciable wall thickening. Hepatic and portal veins are patent. There is no biliary duct dilatation. Pancreas: No pancreatic mass or inflammatory focus. Spleen: No splenic lesions are  evident. Adrenals/Urinary Tract: Adrenals appear normal bilaterally. There are parapelvic cysts in both kidneys. The largest parapelvic cyst is on the left, measuring 2.5 x 1.7 cm. There is no hydronephrosis on either side. There is no appreciable renal or ureteral calculus on either side. Urinary bladder is midline. There is thickening of the anterior wall of the urinary bladder focally. Stomach/Bowel: The patient is status post gastric bypass procedure without wall thickening or fluid in the postoperative area. The appearance in this area stable compared to prior study. There is no appreciable bowel wall or mesenteric thickening. There is no bowel obstruction. No free air or portal venous air. Vascular/Lymphatic: There is no abdominal aortic aneurysm. No vascular lesions are evident on this study. There is no appreciable adenopathy in the abdomen or pelvis. Reproductive: There are several small prostatic calcifications. Prostate and seminal vesicles appear normal in size and contour. There is no pelvic mass or pelvic fluid collection. Other: Appendix appears unremarkable. There is no demonstrable abscess or ascites in the abdomen or pelvis. Patient has had previous of ventral hernia repair. Suspect previous inguinal hernia repairs as well. Musculoskeletal: Postoperative changes noted in the lower lumbar region. There are no blastic or lytic bone lesions. There is no intramuscular or abdominal wall lesion. IMPRESSION: Patchy infiltrate lateral left base region. There is also bibasilar atelectatic change. 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 No bowel obstruction. No abscess. Gallbladder mildly distended without wall thickening. There is mild periportal edema. Etiology for this periportal edema uncertain. This finding potentially could indicate a degree of underlying hepatitis. No renal or ureteral calculus. No hydronephrosis. There is wall thickening in the anterior aspect of  the urinary bladder. A degree of cystitis is questioned. There are several small prostatic calculi. Prior hernia repairs. Appendix appears normal. Electronically Signed   By: Lowella Grip III M.D.   On: 03/19/2016 08:13     Subjective: Pt feels much better, no complaints, ready to go home, eating regular diet  Discharge Exam: Filed Vitals:   03/22/16 0150 03/22/16 0615  BP: 129/72 121/77  Pulse: 52 53  Temp: 98 F (36.7 C) 97.6 F (36.4 C)  Resp: 16 16   Filed Vitals:   03/21/16 1412 03/21/16 2139 03/22/16 0150 03/22/16 0615  BP: 110/65 137/87 129/72 121/77  Pulse: 77 60 52 53  Temp: 98.3 F (36.8 C) 98.8 F (37.1 C) 98 F (36.7 C) 97.6 F (36.4 C)  TempSrc: Oral Oral Oral Oral  Resp: 16 16 16 16   Height:      Weight:      SpO2: 98% 98% 96% 98%   General: Pt is alert, awake, not in acute distress Cardiovascular: RRR, S1/S2 +, no rubs, no gallops Respiratory: CTA bilaterally, no wheezing, no rhonchi Abdominal: Soft,  mild incisional tenderness, ND, bowel sounds + Extremities: no edema, no cyanosis   The results of significant diagnostics from this hospitalization (including imaging, microbiology, ancillary and laboratory) are listed below for reference.     Microbiology: Recent Results (from the past 240 hour(s))  Surgical PCR screen     Status: None   Collection Time: 03/20/16 10:10 AM  Result Value Ref Range Status   MRSA, PCR NEGATIVE NEGATIVE Final   Staphylococcus aureus NEGATIVE NEGATIVE Final    Comment:        The Xpert SA Assay (FDA approved for NASAL specimens in patients over 58 years of age), is one component of a comprehensive surveillance program.  Test performance has been validated by [facility] for patients greater than or equal to 28 year old. It is not intended to diagnose infection nor to guide or monitor treatment.   Culture, blood (Routine X 2) w Reflex to ID Panel     Status: None (Preliminary result)   Collection Time:  03/20/16  5:07 PM  Result Value Ref Range Status   Specimen Description BLOOD LEFT HAND  Final   Special Requests BOTTLES DRAWN AEROBIC AND ANAEROBIC  5CC  Final   Culture   Final    NO GROWTH 2 DAYS Performed at [facility]    Report Status PENDING  Incomplete  Culture, blood (Routine X 2) w Reflex to ID Panel     Status: None (Preliminary result)   Collection Time: 03/20/16  5:07 PM  Result Value Ref Range Status   Specimen Description BLOOD RIGHT ARM  Final   Special Requests BOTTLES DRAWN AEROBIC AND ANAEROBIC  10CC  Final   Culture   Final    NO GROWTH 2 DAYS Performed at [facility]    Report Status PENDING  Incomplete  Surgical pcr screen     Status: None   Collection Time: 03/21/16  8:21 AM  Result Value Ref Range Status   MRSA, PCR NEGATIVE NEGATIVE Final   Staphylococcus aureus NEGATIVE NEGATIVE Final    Comment:        The Xpert SA Assay (FDA approved for NASAL specimens in patients over 59 years of age), is one component of a comprehensive surveillance program.  Test performance has been validated by [facility] for patients greater than or equal to 50 year old. It is not intended to diagnose infection nor to guide or monitor treatment.      Labs: BNP (last 3 results) No results for input(s): BNP in the last 8760 hours. Basic Metabolic Panel:  Recent Labs Lab 03/19/16 0619 03/20/16 0452 03/21/16 0441 03/22/16 0502  NA 142 139 141 140  K 3.9 4.3 4.6 4.5  CL 106 105 109 106  CO2 27 29 29 30   GLUCOSE 129* 122* 121* 130*  BUN 9 10 8 7   CREATININE 1.24 1.20 1.28* 1.13  CALCIUM 8.9 8.5* 8.4* 8.8*   Liver Function Tests:  Recent Labs Lab 03/19/16 0619 03/20/16 0452 03/21/16 0441 03/22/16 0502  AST 58* 48* 26 36  ALT 20 43 28 29  ALKPHOS 77 113 97 93  BILITOT 1.0 1.0 0.5 0.3  PROT 6.6 5.8* 5.6* 5.8*  ALBUMIN 4.4 3.7 3.3* 3.4*    Recent Labs Lab 03/19/16 0619 03/20/16 0452 03/21/16 0441  LIPASE 762* 133* 80*   No  results for input(s): AMMONIA in the last 168 hours. CBC:  Recent Labs Lab 03/19/16 0619 03/20/16 0452 03/21/16 0441 03/22/16 0502  WBC 3.7*  4.9 1.8* 2.6*  NEUTROABS 1.9  --  1.2*  --   HGB 15.2 14.3 13.1 12.9*  HCT 46.2 42.9 39.3 39.0  MCV 95.9 96.4 94.7 95.6  PLT 132* 97* 62* 75*   Cardiac Enzymes: No results for input(s): CKTOTAL, CKMB, CKMBINDEX, TROPONINI in the last 168 hours. BNP: Invalid input(s): POCBNP CBG: No results for input(s): GLUCAP in the last 168 hours. D-Dimer No results for input(s): DDIMER in the last 72 hours. Hgb A1c No results for input(s): HGBA1C in the last 72 hours. Lipid Profile  Recent Labs  03/20/16 0452  CHOL 101  HDL 34*  LDLCALC 43  TRIG 120  CHOLHDL 3.0   Thyroid function studies No results for input(s): TSH, T4TOTAL, T3FREE, THYROIDAB in the last 72 hours.  Invalid input(s): FREET3 Anemia work up  Recent Labs  03/20/16 1707  VITAMINB12 491   Urinalysis    Component Value Date/Time   COLORURINE YELLOW 03/19/2016 [place] 03/19/2016 0659   LABSPEC 1.023 03/19/2016 0659   PHURINE 5.5 03/19/2016 0659   GLUCOSEU NEGATIVE 03/19/2016 0659   HGBUR NEGATIVE 03/19/2016 0659   BILIRUBINUR NEGATIVE 03/19/2016 0659   BILIRUBINUR n 06/09/2011 1154   KETONESUR NEGATIVE 03/19/2016 0659   PROTEINUR NEGATIVE 03/19/2016 0659   PROTEINUR n 06/09/2011 1154   UROBILINOGEN 1.0 05/09/2015 1844   UROBILINOGEN 0.2 06/09/2011 1154   NITRITE NEGATIVE 03/19/2016 0659   NITRITE n 06/09/2011 1154   LEUKOCYTESUR NEGATIVE 03/19/2016 0659   Sepsis Labs Invalid input(s): PROCALCITONIN,  WBC,  LACTICIDVEN Microbiology Recent Results (from the past 240 hour(s))  Surgical PCR screen     Status: None   Collection Time: 03/20/16 10:10 AM  Result Value Ref Range Status   MRSA, PCR NEGATIVE NEGATIVE Final   Staphylococcus aureus NEGATIVE NEGATIVE Final    Comment:        The Xpert SA Assay (FDA approved for NASAL specimens in  patients over 83 years of age), is one component of a comprehensive surveillance program.  Test performance has been validated by [facility] for patients greater than or equal to 2 year old. It is not intended to diagnose infection nor to guide or monitor treatment.   Culture, blood (Routine X 2) w Reflex to ID Panel     Status: None (Preliminary result)   Collection Time: 03/20/16  5:07 PM  Result Value Ref Range Status   Specimen Description BLOOD LEFT HAND  Final   Special Requests BOTTLES DRAWN AEROBIC AND ANAEROBIC  5CC  Final   Culture   Final    NO GROWTH 2 DAYS Performed at [facility]    Report Status PENDING  Incomplete  Culture, blood (Routine X 2) w Reflex to ID Panel     Status: None (Preliminary result)   Collection Time: 03/20/16  5:07 PM  Result Value Ref Range Status   Specimen Description BLOOD RIGHT ARM  Final   Special Requests BOTTLES DRAWN AEROBIC AND ANAEROBIC  10CC  Final   Culture   Final    NO GROWTH 2 DAYS Performed at [facility]    Report Status PENDING  Incomplete  Surgical pcr screen     Status: None   Collection Time: 03/21/16  8:21 AM  Result Value Ref Range Status   MRSA, PCR NEGATIVE NEGATIVE Final   Staphylococcus aureus NEGATIVE NEGATIVE Final    Comment:        The Xpert SA Assay (FDA approved for NASAL specimens in  patients over 60 years of age), is one component of a comprehensive surveillance program.  Test performance has been validated by [facility] for patients greater than or equal to 50 year old. It is not intended to diagnose infection nor to guide or monitor treatment.    Time coordinating discharge: 27 minutes  SIGNED:  Irwin Brakeman, MD  [facility] 03/22/2016, 10:57 AM   If 7PM-7AM, please contact night-coverage [email or web address] Password [email or web address]

## 2016-03-22 NOTE — Progress Notes (Signed)
[facility] Office:  [phone] General Surgery Progress Note   LOS: 2 days  POD -  1 Day Post-Op  Assessment/Plan: 1.  LAPAROSCOPIC CHOLECYSTECTOMY WITH INTRAOPERATIVE CHOLANGIOGRAM - 03/21/2016 - Ernestina Patches to discharge.  Follow up with our office (Dr. Rosendo Gros or me) in 2 to 3 weeks in the office.  He should call our office for any surgical questions.  2.  Gall stone pancreatitis  3.  History of RYGB - D. Rayvon Brandvold 11/06/2009  Initial weight - 280, BMI - 40.1 3 B. Perforated ulcer    Repaired laparoscopically - 06/08/2010 -M. Martin   3C. Internal hernia at the [facility]  Repaired 01/20/2013 at [facility] - D. Elisah Parmer  4. Thrombocytopenia  Plts - 75,000 - 03/22/2016 4.  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  Ready to go home.  Objective:   Filed Vitals:   03/22/16 0150 03/22/16 0615  BP: 129/72 121/77  Pulse: 52 53  Temp: 98 F (36.7 C) 97.6 F (36.4 C)  Resp: 16 16     Intake/Output from previous day:  07/07 0701 - 07/08 0700 In: 2340 [P.O.:1440; I.V.:900] Out: 1405 [Urine:1400; Blood:5]  Intake/Output this shift:      Physical Exam:   General: WN WM who is alert and oriented.    HEENT: Normal. Pupils equal. .   Lungs: clear   Abdomen: Soft   Wound: Clean   Lab Results:    Recent Labs  03/21/16 0441 03/22/16 0502  WBC 1.8* 2.6*  HGB 13.1 12.9*  HCT 39.3 39.0  PLT 62* 75*    BMET   Recent Labs  03/21/16 0441 03/22/16 0502  NA 141 140  K 4.6 4.5  CL 109 106  CO2 29 30  GLUCOSE 121* 130*  BUN 8 7  CREATININE 1.28* 1.13  CALCIUM 8.4* 8.8*    PT/INR   Recent Labs  03/20/16 0452  LABPROT 14.1  INR 1.07    ABG  No results for input(s): PHART, HCO3 in the last 72 hours.  Invalid input(s): PCO2, PO2   Studies/Results:  Dg Cholangiogram Operative  03/21/2016  CLINICAL DATA:   Cholelithiasis EXAM: INTRAOPERATIVE CHOLANGIOGRAM TECHNIQUE: Cholangiographic images from the C-arm fluoroscopic device were submitted for interpretation post-operatively. Please see the procedural report for the amount of contrast and the fluoroscopy time utilized. COMPARISON:  Ultrasound 03/19/2016 FINDINGS: No persistent filling defects in the common duct. Intrahepatic ducts are incompletely visualized, appearing decompressed centrally. Contrast passes into the duodenum. : Negative for retained common duct stone. Electronically Signed   By: Lucrezia Europe M.D.   On: 03/21/2016 11:02     Anti-infectives:   Anti-infectives    Start     Dose/Rate Route Frequency Ordered Stop   03/20/16 0900  ceFAZolin (ANCEF) powder 2 g     2 g Other On call to O.R. 03/20/16 0845 03/21/16 0559      Alphonsa Overall, MD, FACS Pager: [facility] Office: [phone] 03/22/2016

## 2016-03-22 NOTE — Discharge Instructions (Signed)
Acute Pancreatitis Acute pancreatitis is a disease in which the pancreas becomes suddenly irritated (inflamed). The pancreas is a large gland behind your stomach. The pancreas makes enzymes that help digest food. The pancreas also makes 2 hormones that help control your blood sugar. Acute pancreatitis happens when the enzymes attack and damage the pancreas. Most attacks last a couple of days and can cause serious problems. HOME CARE  Follow your doctor's diet instructions. You may need to avoid alcohol and limit fat in your diet.  Eat small meals often.  Drink enough fluids to keep your pee (urine) clear or pale yellow.  Only take medicines as told by your doctor.  Avoid drinking alcohol if it caused your disease.  Do not smoke.  Get plenty of rest.  Check your blood sugar at home as told by your doctor.  Keep all doctor visits as told. GET HELP IF:  You do not get better as quickly as expected.  You have new or worsening symptoms.  You have lasting pain, weakness, or feel sick to your stomach (nauseous).  You get better and then have another pain attack. GET HELP RIGHT AWAY IF:   You are unable to eat or keep fluids down.  Your pain becomes severe.  You have a fever or lasting symptoms for more than 2 to 3 days.  You have a fever and your symptoms suddenly get worse.  Your skin or the white part of your eyes turn yellow (jaundice).  You throw up (vomit).  You feel dizzy, or you pass out (faint).  Your blood sugar is high (over 300 mg/dL). MAKE SURE YOU:   Understand these instructions.  Will watch your condition.  Will get help right away if you are not doing well or get worse.   This information is not intended to replace advice given to you by your health care provider. Make sure you discuss any questions you have with your health care provider.   Document Released: 02/18/2008 Document Revised: 09/22/2014 Document Reviewed: 12/11/2011 Elsevier  Interactive Patient Education 2016 Elsevier Inc. Laparoscopic Cholecystectomy, Care After These instructions give you information about caring for yourself after your procedure. Your doctor may also give you more specific instructions. Call your doctor if you have any problems or questions after your procedure. HOME CARE Incision Care  Follow instructions from your doctor about how to take care of your cuts from surgery (incisions). Make sure you:  Wash your hands with soap and water before you change your bandage (dressing). If you cannot use soap and water, use hand sanitizer.  Change your bandage as told by your doctor.  Leave stitches (sutures), skin glue, or skin tape (adhesive) strips in place. They may need to stay in place for 2 weeks or longer. If tape strips get loose and curl up, you may trim the loose edges. Do not remove tape strips completely unless your doctor says it is okay.  Do not take baths, swim, or use a hot tub until your doctor says it is okay. Ask your doctor if you can take showers. You may only be allowed to take sponge baths. General Instructions  Take over-the-counter and prescription medicines only as told by your doctor.  Do not drive or use heavy machinery while taking prescription pain medicine.  Return to your normal diet as told by your doctor.  Do not lift anything that is heavier than 10 lb (4.5 kg).  Do not play contact sports for 1 week or until your  doctor says it is okay. GET HELP IF:  You have redness, swelling, or pain at the site of your surgical cuts.  You have fluid, blood, or pus coming from your cuts.  You notice a bad smell coming from your cut area.  Your surgical cuts break open.  You have a fever. GET HELP RIGHT AWAY IF:   You have a rash.  You have trouble breathing.  You have chest pain.  You have pain in your shoulders (shoulder strap areas) that is getting worse.  You pass out (faint) or feel dizzy while you are  standing.  You have very bad pain in your belly (abdomen).  You feel sick to your stomach (nauseous) or throw up (vomit) for more than 1 day.   This information is not intended to replace advice given to you by your health care provider. Make sure you discuss any questions you have with your health care provider.   Document Released: 06/10/2008 Document Revised: 05/23/2015 Document Reviewed: 04/13/2013 Elsevier Interactive Patient Education [employer].

## 2016-03-22 NOTE — Progress Notes (Signed)
Nurse paged Hector [name], hospitalist. Per Hector [name], nurse can give 2 Percocet for pain. Santiago Glad is aware of pts pulse of 52.

## 2016-03-25 LAB — CULTURE, BLOOD (ROUTINE X 2)
CULTURE: NO GROWTH
Culture: NO GROWTH

## 2016-04-02 DIAGNOSIS — F3132 Bipolar disorder, current episode depressed, moderate: Secondary | ICD-10-CM | POA: Diagnosis not present

## 2016-04-03 DIAGNOSIS — M5416 Radiculopathy, lumbar region: Secondary | ICD-10-CM | POA: Diagnosis not present

## 2016-04-04 ENCOUNTER — Telehealth: Payer: Self-pay | Admitting: Internal Medicine

## 2016-04-04 MED ORDER — FLUOCINONIDE-E 0.05 % EX CREA: 1.0000 "application " | TOPICAL_CREAM | Freq: Two times a day (BID) | CUTANEOUS | Status: DC

## 2016-04-04 NOTE — Telephone Encounter (Signed)
Patient just dc from hosp 2 weeks ago for  cholecystis  with pancreatitis   Sending in stronger steroid topical  Can use Saturday clinic if needed.

## 2016-04-04 NOTE — Telephone Encounter (Signed)
Below home advice given to patient. Patient ideally would like to have a steroid called in and not come in for an appointment. Please advise if home advice is sufficient, approve to call in medicaiton or requiring appointment for medication?    SEE PHYSICIAN WITHIN 24 HOURS: APPLY LOCAL COLD - ICE MASSAGE METHOD * For pain, massage the area of the sting with an ice cube for 10 min prn. OTC ANTIHISTAMINE FOR ITCHING: * If the sting becomes itchy, take a dose of Benadryl (OTC diphenhydramine). RINGS: For swelling that involves the hand, remove any rings. CALL BACK IF: * Fever occurs CARE ADVICE given per Bee or Yellow Jacket Sting (Adult) guideline * You become worse HOME CARE: You should be able to treat this at home. REASSURANCE: It sounds like a mild case of poison ivy that we can treat at home. HYDROCORTISONE CREAM FOR ITCHING: * Apply hydrocortisone cream 4 times a day for 5 days. * Keep the cream in the refrigerator (Reason: it feels better if applied cold) LOCAL COLD: Soak the involved area in cool water for 20 minutes or massage it with an ice cube as often as necessary to reduce itching and oozing. ANTIHISTAMINE FOR ITCHING: * Take an antihistamine by mouth to reduce the itching. Benadryl (OTC diphenhydramine) is best. Adult dose is 25-50 mg. Take it up to 4 times a day. * If Benadryl is not available, use any hay fever or cold medicine that contains an antihistamine. EXPECTED COURSE: Usually lasts 2 weeks. Treatment reduces the severity of the symptoms, not how long they last. CALL BACK IF: * Looks infected * Poison ivy lasts over 3 weeks * You become worse. CARE ADVICE given per [place] (Adult) guideline.

## 2016-04-04 NOTE — Telephone Encounter (Signed)
Patient Name: Hector [name] DOB: 1961-03-13 Initial Comment Caller states got several bee stings on had and poison ivy on arm Nurse Assessment Nurse: Ronnald Ramp, RN, Miranda Date/Time (Eastern Time): 04/04/2016 1:08:25 PM Confirm and document reason for call. If symptomatic, describe symptoms. You must click the next button to save text entered. ---Caller states he was stung multiple times on his left hand and arm last night. He also has poison oak on his lower arms that has been there for a couple of weeks. Has the patient traveled out of the country within the last 30 days? ---Not Applicable Does the patient have any new or worsening symptoms? ---Yes Will a triage be completed? ---Yes Related visit to physician within the last 2 weeks? ---No Does the PT have any chronic conditions? (i.e. diabetes, asthma, etc.) ---Yes List chronic conditions. ---Chronic back pain, Anxiety. Is this a behavioral health or substance abuse call? ---No Nurse: Ronnald Ramp, RN, Miranda Date/Time (Eastern Time): 04/04/2016 1:13:51 PM Please select the assessment type ---Verbal order / New medication order Additional Documentation ---Caller is requesting prescription for steroids without having to make an appt. Told caller I would send a note to MD but if he does not hear back from them today to please call back for Sat clinic appt. Does the client directives allow for assistance with medications after hours? ---Yes Other current medications? ---Yes List current medications. ---Lexapro, Lamictal, Trazadone Medication allergies? ---Yes List medication allergies. ---Hydrocodone, Cymbalta Pharmacy name and phone number. ---[facility] [address] Does the client directive allow for RN to call in the medication order to the pharmacy? ---No Guidelines Guideline Title Affirmed Question Affirmed Notes Bee or Yellow Jacket Sting Swelling is huge (e.g., > 4 inches or 10 cm, Final Disposition [place], RN,  Miranda Referrals REFERRED TO PCP OFFICE

## 2016-04-09 ENCOUNTER — Other Ambulatory Visit: Payer: Self-pay | Admitting: Neurological Surgery

## 2016-04-16 NOTE — Telephone Encounter (Signed)
Noted steroid was sent as patient requested

## 2016-04-18 ENCOUNTER — Encounter (HOSPITAL_COMMUNITY): Payer: Self-pay

## 2016-04-18 ENCOUNTER — Other Ambulatory Visit (HOSPITAL_COMMUNITY): Payer: Self-pay | Admitting: *Deleted

## 2016-04-18 NOTE — Pre-Procedure Instructions (Signed)
KANNAN PAMINTUAN  04/18/2016      [id number] [address], [place]  [address] Phone: [phone] [phone]:[phone]    Your procedure is scheduled on Friday, August 18.  Report to [facility] Admitting at 5:30 A.M.               Your surgery or procedure is scheduled for 7:30 AM   Call this number if you have problems the morning of [phone]                 For any other questions, please call [phone], Monday - Friday 8 AM - 4 PM.   Remember:  Do not eat food or drink liquids after midnight Thursday, August 17.  Take these medicines the morning of surgery with A SIP OF WATER : escitalopram (LEXAPRO), lamoTRIgine (LAMICTAL).                May take if needed and you tolerate on an empty stomach: oxyCODONE-acetaminophen (PERCOCET/ROXICET), cyclobenzaprine (FLEXERIL), ondansetron (ZOFRAN).                  [place]- Preparing For Surgery  Before surgery, you can play an important role. Because skin is not sterile, your skin needs to be as free of germs as possible. You can reduce the number of germs on your skin by washing with CHG (chlorahexidine gluconate) Soap before surgery.  CHG is an antiseptic cleaner which kills germs and bonds with the skin to continue killing germs even after washing.  Please do not use if you have an allergy to CHG or antibacterial soaps. If your skin becomes reddened/irritated stop using the CHG.  Do not shave (including legs and underarms) for at least 48 hours prior to first CHG shower. It is OK to shave your face.  Please follow these instructions carefully.   1. Shower the NIGHT BEFORE SURGERY and the MORNING OF SURGERY with CHG.   2. If you chose to wash your hair, wash your hair first as usual with your normal shampoo.  3. After you shampoo, rinse your hair and body thoroughly to remove the shampoo.  4. Use CHG as you would any other liquid soap. You can apply CHG  directly to the skin and wash gently with a scrungie or a clean washcloth.   5. Apply the CHG Soap to your body ONLY FROM THE NECK DOWN.  Do not use on open wounds or open sores. Avoid contact with your eyes, ears, mouth and genitals (private parts). Wash genitals (private parts) with your normal soap.  6. Wash thoroughly, paying special attention to the area where your surgery will be performed.  7. Thoroughly rinse your body with warm water from the neck down.  8. DO NOT shower/wash with your normal soap after using and rinsing off the CHG Soap.  9. Pat yourself dry with a CLEAN TOWEL.   10. Wear CLEAN PAJAMAS   11. Place CLEAN SHEETS on your bed the night of your first shower and DO NOT SLEEP WITH PETS.   Day of Surgery: Do not apply any deodorants/lotions. Please wear clean clothes to the hospital/surgery center.      Do not wear jewelry, make-up or nail polish.  Do not wear lotions, powders, or perfumes.  DO NOT wear deodorant.  Do not shave 48 hours prior to surgery.  Men may shave face and neck.  Do not bring valuables to  the hospital.  [facility] is not responsible for any belongings or valuables.  Contacts, dentures or bridgework may not be worn into surgery.  Leave your suitcase in the car.  After surgery it may be brought to your room.  For patients admitted to the hospital, discharge time will be determined by your treatment team.  Special instructions: -  Please read over the following fact sheets that you were given. Patient Instructions for Mupirocin Application

## 2016-04-21 ENCOUNTER — Encounter (HOSPITAL_COMMUNITY)
Admission: RE | Admit: 2016-04-21 | Discharge: 2016-04-21 | Disposition: A | Discharge status: Home / Self Care | Payer: Federal, State, Local not specified - PPO | Source: Ambulatory Visit | Attending: Neurological Surgery | Admitting: Neurological Surgery

## 2016-04-21 ENCOUNTER — Encounter (HOSPITAL_COMMUNITY): Payer: Self-pay

## 2016-04-21 DIAGNOSIS — G4733 Obstructive sleep apnea (adult) (pediatric): Secondary | ICD-10-CM | POA: Insufficient documentation

## 2016-04-21 DIAGNOSIS — Z9884 Bariatric surgery status: Secondary | ICD-10-CM | POA: Insufficient documentation

## 2016-04-21 DIAGNOSIS — Z01812 Encounter for preprocedural laboratory examination: Secondary | ICD-10-CM | POA: Insufficient documentation

## 2016-04-21 DIAGNOSIS — M5416 Radiculopathy, lumbar region: Secondary | ICD-10-CM | POA: Diagnosis not present

## 2016-04-21 DIAGNOSIS — K219 Gastro-esophageal reflux disease without esophagitis: Secondary | ICD-10-CM | POA: Insufficient documentation

## 2016-04-21 DIAGNOSIS — E785 Hyperlipidemia, unspecified: Secondary | ICD-10-CM | POA: Insufficient documentation

## 2016-04-21 DIAGNOSIS — E119 Type 2 diabetes mellitus without complications: Secondary | ICD-10-CM | POA: Insufficient documentation

## 2016-04-21 DIAGNOSIS — Z01818 Encounter for other preprocedural examination: Secondary | ICD-10-CM | POA: Diagnosis not present

## 2016-04-21 HISTORY — DX: Acute pancreatitis without necrosis or infection, unspecified: K85.90

## 2016-04-21 LAB — COMPREHENSIVE METABOLIC PANEL
ALT: 12 U/L — AB (ref 17–63)
AST: 18 U/L (ref 15–41)
Albumin: 4.1 g/dL (ref 3.5–5.0)
Alkaline Phosphatase: 76 U/L (ref 38–126)
Anion gap: 7 (ref 5–15)
BUN: 10 mg/dL (ref 6–20)
CALCIUM: 9.1 mg/dL (ref 8.9–10.3)
CHLORIDE: 105 mmol/L (ref 101–111)
CO2: 30 mmol/L (ref 22–32)
Creatinine, Ser: 1.21 mg/dL (ref 0.61–1.24)
GFR calc non Af Amer: >60 mL/min (ref >60)
GLUCOSE: 113 mg/dL — AB (ref 65–99)
POTASSIUM: 4.3 mmol/L (ref 3.5–5.1)
SODIUM: 142 mmol/L (ref 135–145)
TOTAL PROTEIN: 6.3 g/dL — AB (ref 6.5–8.1)
Total Bilirubin: 0.6 mg/dL (ref 0.3–1.2)

## 2016-04-21 LAB — SURGICAL PCR SCREEN
MRSA, PCR: NEGATIVE
STAPHYLOCOCCUS AUREUS: POSITIVE — AB

## 2016-04-21 LAB — CBC
HCT: 46.5 % (ref 39.0–52.0)
HEMOGLOBIN: 15.1 g/dL (ref 13.0–17.0)
MCH: 31.7 pg (ref 26.0–34.0)
MCHC: 32.5 g/dL (ref 30.0–36.0)
MCV: 97.5 fL (ref 78.0–100.0)
PLATELETS: 131 10*3/uL — AB (ref 150–400)
RBC: 4.77 MIL/uL (ref 4.22–5.81)
RDW: 13.1 % (ref 11.5–15.5)
WBC: 4.4 10*3/uL (ref 4.0–10.5)

## 2016-04-21 NOTE — Progress Notes (Addendum)
Patient states that (his words) he had some chest pain more than 10 yrs ago.  He states they told him it was angina, -  Echo (2016)  & stress(2007) were done...negative results.  Has not seen a cardio nor has  the episode been repeated. Recently was at [facility] for a bout of pancreatitis. PCP is Dr. Idell Pickles  [place] 09/2015.

## 2016-04-22 LAB — HEMOGLOBIN A1C
Hgb A1c MFr Bld: 5.7 % — ABNORMAL HIGH (ref 4.8–5.6)
Mean Plasma Glucose: 117 mg/dL

## 2016-04-24 NOTE — Progress Notes (Signed)
Anesthesia Chart Review:  Pt is a 55 year old male scheduled for L5-S1 removal of hardware on 05/02/2016 with Kristeen Miss, MD.   PMH includes:  DM, hyperlipidemia, OSA, GERD. Never smoker. BMI 25.5. S/p cholecystectomy 03/21/16. S/p lumbar decompression 06/07/14. S/p gastric bypass 2011.   Pt is not on medication for DM or hyperlipidemia.   Preoperative labs reviewed.  HgbA1c 5.7, 113.   EKG 05/09/15: SR. Prolonged PR interval. Nonspecific intraventricular conduction delay. Repol abnrm, severe global ischemia (LM/MVD). Appears stable when compared to EKG 07/14/14  Echo 10/20/12:  - Left ventricle: The cavity size was normal. Wall thickness was normal. Systolic function was normal. The estimated ejection fraction was in the range of 60% to 65%. Wall motion was normal; there were no regional wall motion abnormalities. Doppler parameters are consistent with abnormal left ventricular relaxation (grade 1 diastolic dysfunction). - Aortic valve: Mild regurgitation. - Right ventricle: The cavity size was mildly dilated. - Right atrium: The atrium was mildly dilated. - Atrial septum: There was an atrial septal aneurysm.  Exercise tolerance test 10/20/12: Normal ETT  If no changes, I anticipate pt can proceed with surgery as scheduled.   Willeen Cass, FNP-BC [facility] Short Stay Surgical Center/Anesthesiology Phone: [phone] 04/24/2016 2:18 PM

## 2016-04-26 ENCOUNTER — Other Ambulatory Visit: Payer: Self-pay | Admitting: Internal Medicine

## 2016-04-28 NOTE — Telephone Encounter (Signed)
Left a message for a return call.

## 2016-04-29 NOTE — Telephone Encounter (Signed)
Spoke to the pt.  He authorized request from the pharmacy.  He stated headaches have come back and amitriptyline is the best treatment. Please advise.

## 2016-05-01 NOTE — Telephone Encounter (Signed)
Sent to the pharmacy by e-scribe. 

## 2016-05-01 NOTE — Telephone Encounter (Signed)
Ok to refill x 1   If headaches more problematic  I suggest he get back with neurology  . Let us know if we need to help with that.

## 2016-05-02 ENCOUNTER — Ambulatory Visit (HOSPITAL_COMMUNITY): Payer: Federal, State, Local not specified - PPO

## 2016-05-02 ENCOUNTER — Encounter (HOSPITAL_COMMUNITY)
Admission: RE | Disposition: A | Discharge status: Home / Self Care | Payer: Self-pay | Source: Ambulatory Visit | Attending: Neurological Surgery

## 2016-05-02 ENCOUNTER — Ambulatory Visit (HOSPITAL_COMMUNITY)
Admission: RE | Admit: 2016-05-02 | Discharge: 2016-05-02 | Disposition: A | Discharge status: Home / Self Care | Payer: Federal, State, Local not specified - PPO | Source: Ambulatory Visit | Attending: Neurological Surgery | Admitting: Neurological Surgery

## 2016-05-02 ENCOUNTER — Encounter (HOSPITAL_COMMUNITY): Payer: Self-pay | Admitting: General Practice

## 2016-05-02 ENCOUNTER — Inpatient Hospital Stay (HOSPITAL_COMMUNITY): Payer: Federal, State, Local not specified - PPO | Admitting: Certified Registered"

## 2016-05-02 ENCOUNTER — Inpatient Hospital Stay (HOSPITAL_COMMUNITY): Payer: Federal, State, Local not specified - PPO | Admitting: Emergency Medicine

## 2016-05-02 DIAGNOSIS — M960 Pseudarthrosis after fusion or arthrodesis: Secondary | ICD-10-CM | POA: Diagnosis not present

## 2016-05-02 DIAGNOSIS — M1990 Unspecified osteoarthritis, unspecified site: Secondary | ICD-10-CM | POA: Diagnosis not present

## 2016-05-02 DIAGNOSIS — J45909 Unspecified asthma, uncomplicated: Secondary | ICD-10-CM | POA: Diagnosis not present

## 2016-05-02 DIAGNOSIS — Y838 Other surgical procedures as the cause of abnormal reaction of the patient, or of later complication, without mention of misadventure at the time of the procedure: Secondary | ICD-10-CM | POA: Diagnosis not present

## 2016-05-02 DIAGNOSIS — Z472 Encounter for removal of internal fixation device: Secondary | ICD-10-CM | POA: Diagnosis not present

## 2016-05-02 DIAGNOSIS — M5416 Radiculopathy, lumbar region: Secondary | ICD-10-CM | POA: Diagnosis not present

## 2016-05-02 DIAGNOSIS — K219 Gastro-esophageal reflux disease without esophagitis: Secondary | ICD-10-CM | POA: Diagnosis not present

## 2016-05-02 DIAGNOSIS — G4730 Sleep apnea, unspecified: Secondary | ICD-10-CM | POA: Diagnosis not present

## 2016-05-02 DIAGNOSIS — G8929 Other chronic pain: Secondary | ICD-10-CM | POA: Diagnosis not present

## 2016-05-02 DIAGNOSIS — T84226A Displacement of internal fixation device of vertebrae, initial encounter: Secondary | ICD-10-CM | POA: Diagnosis not present

## 2016-05-02 DIAGNOSIS — Z419 Encounter for procedure for purposes other than remedying health state, unspecified: Secondary | ICD-10-CM

## 2016-05-02 HISTORY — PX: HARDWARE REMOVAL: SHX979

## 2016-05-02 SURGERY — REMOVAL, HARDWARE
Anesthesia: General

## 2016-05-02 MED ORDER — MIDAZOLAM HCL 2 MG/2ML IJ SOLN
INTRAMUSCULAR | Status: AC
  Filled 2016-05-02: qty 2

## 2016-05-02 MED ORDER — THROMBIN 5000 UNITS EX SOLR
CUTANEOUS | Status: DC | PRN
  Administered 2016-05-02 (×2): 5000 [IU] via TOPICAL

## 2016-05-02 MED ORDER — OXYCODONE-ACETAMINOPHEN 5-325 MG PO TABS: 1.0000 | ORAL_TABLET | ORAL | 0 refills | Status: DC | PRN

## 2016-05-02 MED ORDER — PROMETHAZINE HCL 25 MG/ML IJ SOLN: 6.2500 mg | INTRAMUSCULAR | Status: DC | PRN

## 2016-05-02 MED ORDER — SODIUM CHLORIDE 0.9 % IR SOLN
Status: DC | PRN
  Administered 2016-05-02: 08:00:00

## 2016-05-02 MED ORDER — HEMOSTATIC AGENTS (NO CHARGE) OPTIME
TOPICAL | Status: DC | PRN
  Administered 2016-05-02: 1 via TOPICAL

## 2016-05-02 MED ORDER — ONDANSETRON HCL 4 MG/2ML IJ SOLN
INTRAMUSCULAR | Status: AC
  Filled 2016-05-02: qty 2

## 2016-05-02 MED ORDER — LACTATED RINGERS IV SOLN: INTRAVENOUS | Status: DC

## 2016-05-02 MED ORDER — PHENYLEPHRINE HCL 10 MG/ML IJ SOLN
INTRAVENOUS | Status: DC | PRN
  Administered 2016-05-02: 10 ug/min via INTRAVENOUS

## 2016-05-02 MED ORDER — CHLORHEXIDINE GLUCONATE CLOTH 2 % EX PADS: 6.0000 | MEDICATED_PAD | Freq: Once | CUTANEOUS | Status: DC

## 2016-05-02 MED ORDER — SUCCINYLCHOLINE CHLORIDE 20 MG/ML IJ SOLN
INTRAMUSCULAR | Status: DC | PRN
  Administered 2016-05-02: 100 mg via INTRAVENOUS

## 2016-05-02 MED ORDER — FENTANYL CITRATE (PF) 100 MCG/2ML IJ SOLN
INTRAMUSCULAR | Status: DC | PRN
  Administered 2016-05-02: 100 ug via INTRAVENOUS

## 2016-05-02 MED ORDER — FENTANYL CITRATE (PF) 100 MCG/2ML IJ SOLN
INTRAMUSCULAR | Status: AC
  Filled 2016-05-02: qty 2

## 2016-05-02 MED ORDER — PROPOFOL 10 MG/ML IV BOLUS
INTRAVENOUS | Status: DC | PRN
  Administered 2016-05-02: 200 mg via INTRAVENOUS

## 2016-05-02 MED ORDER — HYDROMORPHONE HCL 1 MG/ML IJ SOLN: 0.2500 mg | INTRAMUSCULAR | Status: DC | PRN

## 2016-05-02 MED ORDER — ONDANSETRON HCL 4 MG/2ML IJ SOLN
INTRAMUSCULAR | Status: DC | PRN
  Administered 2016-05-02: 4 mg via INTRAVENOUS

## 2016-05-02 MED ORDER — PROPOFOL 10 MG/ML IV BOLUS
INTRAVENOUS | Status: AC
  Filled 2016-05-02: qty 20

## 2016-05-02 MED ORDER — SUGAMMADEX SODIUM 200 MG/2ML IV SOLN
INTRAVENOUS | Status: DC | PRN
  Administered 2016-05-02: 160 mg via INTRAVENOUS

## 2016-05-02 MED ORDER — MIDAZOLAM HCL 5 MG/5ML IJ SOLN
INTRAMUSCULAR | Status: DC | PRN
  Administered 2016-05-02: 2 mg via INTRAVENOUS

## 2016-05-02 MED ORDER — LIDOCAINE-EPINEPHRINE 1 %-1:100000 IJ SOLN
INTRAMUSCULAR | Status: DC | PRN
  Administered 2016-05-02: 7 mL

## 2016-05-02 MED ORDER — MEPERIDINE HCL 25 MG/ML IJ SOLN: 6.2500 mg | INTRAMUSCULAR | Status: DC | PRN

## 2016-05-02 MED ORDER — SUGAMMADEX SODIUM 200 MG/2ML IV SOLN
INTRAVENOUS | Status: AC
  Filled 2016-05-02: qty 2

## 2016-05-02 MED ORDER — LIDOCAINE HCL (CARDIAC) 20 MG/ML IV SOLN
INTRAVENOUS | Status: DC | PRN
  Administered 2016-05-02 (×2): 50 mg via INTRAVENOUS

## 2016-05-02 MED ORDER — 0.9 % SODIUM CHLORIDE (POUR BTL) OPTIME
TOPICAL | Status: DC | PRN
  Administered 2016-05-02: 1000 mL

## 2016-05-02 MED ORDER — PHENYLEPHRINE HCL 10 MG/ML IJ SOLN
INTRAMUSCULAR | Status: DC | PRN
  Administered 2016-05-02 (×2): 40 ug via INTRAVENOUS

## 2016-05-02 MED ORDER — ROCURONIUM BROMIDE 100 MG/10ML IV SOLN
INTRAVENOUS | Status: DC | PRN
  Administered 2016-05-02: 20 mg via INTRAVENOUS
  Administered 2016-05-02: 30 mg via INTRAVENOUS

## 2016-05-02 MED ORDER — BUPIVACAINE HCL (PF) 0.5 % IJ SOLN
INTRAMUSCULAR | Status: DC | PRN
  Administered 2016-05-02: 20 mL
  Administered 2016-05-02: 7 mL

## 2016-05-02 MED ORDER — CEFAZOLIN SODIUM-DEXTROSE 2-4 GM/100ML-% IV SOLN
2.0000 g | INTRAVENOUS | Status: AC
  Administered 2016-05-02: 2 g via INTRAVENOUS
  Filled 2016-05-02: qty 100

## 2016-05-02 MED ORDER — LACTATED RINGERS IV SOLN
INTRAVENOUS | Status: DC | PRN
  Administered 2016-05-02: 07:00:00 via INTRAVENOUS

## 2016-05-02 SURGICAL SUPPLY — 50 items
ADH SKN CLS APL DERMABOND .7 (GAUZE/BANDAGES/DRESSINGS) ×1
APL SKNCLS STERI-STRIP NONHPOA (GAUZE/BANDAGES/DRESSINGS)
BAG DECANTER FOR FLEXI CONT (MISCELLANEOUS) ×2 IMPLANT
BENZOIN TINCTURE PRP APPL 2/3 (GAUZE/BANDAGES/DRESSINGS) IMPLANT
BLADE CLIPPER SURG (BLADE) IMPLANT
CANISTER SUCT 3000ML PPV (MISCELLANEOUS) ×2 IMPLANT
CONT SPEC 4OZ CLIKSEAL STRL BL (MISCELLANEOUS) ×1 IMPLANT
DECANTER SPIKE VIAL GLASS SM (MISCELLANEOUS) ×2 IMPLANT
DERMABOND ADVANCED (GAUZE/BANDAGES/DRESSINGS) ×1
DERMABOND ADVANCED .7 DNX12 (GAUZE/BANDAGES/DRESSINGS) IMPLANT
DRAPE C-ARM 42X72 X-RAY (DRAPES) IMPLANT
DRAPE LAPAROTOMY 100X72 PEDS (DRAPES) IMPLANT
DRAPE LAPAROTOMY 100X72X124 (DRAPES) ×1 IMPLANT
DRAPE POUCH INSTRU U-SHP 10X18 (DRAPES) ×2 IMPLANT
DURAPREP 26ML APPLICATOR (WOUND CARE) ×2 IMPLANT
ELECT BLADE 4.0 EZ CLEAN MEGAD (MISCELLANEOUS) ×2
ELECT REM PT RETURN 9FT ADLT (ELECTROSURGICAL) ×2
ELECTRODE BLDE 4.0 EZ CLN MEGD (MISCELLANEOUS) ×1 IMPLANT
ELECTRODE REM PT RTRN 9FT ADLT (ELECTROSURGICAL) ×1 IMPLANT
GAUZE SPONGE 4X4 12PLY STRL (GAUZE/BANDAGES/DRESSINGS) ×1 IMPLANT
GAUZE SPONGE 4X4 16PLY XRAY LF (GAUZE/BANDAGES/DRESSINGS) IMPLANT
GLOVE BIOGEL PI IND STRL 8.5 (GLOVE) ×1 IMPLANT
GLOVE BIOGEL PI INDICATOR 8.5 (GLOVE) ×1
GLOVE ECLIPSE 8.5 STRL (GLOVE) ×2 IMPLANT
GOWN STRL REUS W/ TWL LRG LVL3 (GOWN DISPOSABLE) ×1 IMPLANT
GOWN STRL REUS W/ TWL XL LVL3 (GOWN DISPOSABLE) ×1 IMPLANT
GOWN STRL REUS W/TWL 2XL LVL3 (GOWN DISPOSABLE) ×2 IMPLANT
GOWN STRL REUS W/TWL LRG LVL3 (GOWN DISPOSABLE) ×2
GOWN STRL REUS W/TWL XL LVL3 (GOWN DISPOSABLE) ×2
KIT BASIN OR (CUSTOM PROCEDURE TRAY) ×2 IMPLANT
KIT ROOM TURNOVER OR (KITS) ×2 IMPLANT
MARKER SKIN DUAL TIP RULER LAB (MISCELLANEOUS) ×2 IMPLANT
NEEDLE HYPO 22GX1.5 SAFETY (NEEDLE) ×2 IMPLANT
NS IRRIG 1000ML POUR BTL (IV SOLUTION) ×2 IMPLANT
PACK LAMINECTOMY NEURO (CUSTOM PROCEDURE TRAY) ×2 IMPLANT
PAD ARMBOARD 7.5X6 YLW CONV (MISCELLANEOUS) ×2 IMPLANT
RASP 3.0MM (RASP) ×1 IMPLANT
SPONGE LAP 4X18 X RAY DECT (DISPOSABLE) IMPLANT
SPONGE SURGIFOAM ABS GEL SZ50 (HEMOSTASIS) ×2 IMPLANT
STAPLER SKIN PROX WIDE 3.9 (STAPLE) IMPLANT
STRIP CLOSURE SKIN 1/2X4 (GAUZE/BANDAGES/DRESSINGS) IMPLANT
SUT VIC AB 0 CT1 18XCR BRD8 (SUTURE) ×1 IMPLANT
SUT VIC AB 0 CT1 8-18 (SUTURE) ×2
SUT VIC AB 2-0 CP2 18 (SUTURE) ×2 IMPLANT
SUT VIC AB 3-0 SH 8-18 (SUTURE) ×3 IMPLANT
SWAB COLLECTION DEVICE MRSA (MISCELLANEOUS) IMPLANT
TOWEL OR 17X24 6PK STRL BLUE (TOWEL DISPOSABLE) ×2 IMPLANT
TOWEL OR 17X26 10 PK STRL BLUE (TOWEL DISPOSABLE) ×2 IMPLANT
TUBE ANAEROBIC SPECIMEN COL (MISCELLANEOUS) IMPLANT
WATER STERILE IRR 1000ML POUR (IV SOLUTION) ×2 IMPLANT

## 2016-05-02 NOTE — Anesthesia Preprocedure Evaluation (Addendum)
Anesthesia Evaluation  Patient identified by MRN, date of birth, ID band Patient awake    Reviewed: Allergy & Precautions, NPO status , Patient's Chart, lab work & pertinent test results  Airway Mallampati: II  TM Distance: >3 FB Neck ROM: Full    Dental  (+) Teeth Intact, Dental Advisory Given   Pulmonary asthma , sleep apnea ,    breath sounds clear to auscultation       Cardiovascular  Rhythm:Regular Rate:Normal     Neuro/Psych  Headaches, PSYCHIATRIC DISORDERS Anxiety  Neuromuscular disease    GI/Hepatic Neg liver ROS, PUD, GERD  ,  Endo/Other  negative endocrine ROSdiabetes, Type 2  Renal/GU Renal InsufficiencyRenal disease     Musculoskeletal  (+) Arthritis , Osteoarthritis,    Abdominal   Peds  Hematology negative hematology ROS (+)   Anesthesia Other Findings   Reproductive/Obstetrics negative OB ROS                           Lab Results  Component Value Date   WBC 4.4 04/21/2016   HGB 15.1 04/21/2016   HCT 46.5 04/21/2016   MCV 97.5 04/21/2016   PLT 131 (L) 04/21/2016   Lab Results  Component Value Date   CREATININE 1.21 04/21/2016   BUN 10 04/21/2016   NA 142 04/21/2016   K 4.3 04/21/2016   CL 105 04/21/2016   CO2 30 04/21/2016   Lab Results  Component Value Date   INR 1.07 03/20/2016   INR 0.95 11/03/2014   INR 0.97 10/19/2012   04/2015 EKG: normal sinus rhythm.  Anesthesia Physical Anesthesia Plan  ASA: II  Anesthesia Plan: General   Post-op Pain Management:    Induction: Intravenous  Airway Management Planned: Oral ETT  Additional Equipment:   Intra-op Plan:   Post-operative Plan: Extubation in OR  Informed Consent: I have reviewed the patients History and Physical, chart, labs and discussed the procedure including the risks, benefits and alternatives for the proposed anesthesia with the patient or authorized representative who has indicated  his/her understanding and acceptance.   Dental advisory given  Plan Discussed with: CRNA  Anesthesia Plan Comments:         Anesthesia Quick Evaluation

## 2016-05-02 NOTE — Op Note (Signed)
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 endotracheal Indications: Mr. ishamel garris is a 55 year old individual who had undergone decompression and fusion at L5-S1. He's had some evidence of pseudoarthrosis and persistent pain and after some discussion is advised that pain may improve after removal of hardware is admitted to the operating room now to undergo surgical removal of his hardware at L5-S1 using Metrix technique and fluoroscopic guidance.  Procedure: The patient was brought to the operating room supine on a stretcher. After the smooth induction of general endotracheal anesthesia, he was turned prone. The bony prominences were appropriately padded and protected. Fluoroscopic guidance was used to localize L5-S1 after cleansing the back with alcohol and prepping with DuraPrep and draping sterilely. Marks were made on the sterile drape and the skin was infiltrated with 10 mL of lidocaine 1% with epinephrine 1-100,000 mixed half-and-half with half percent Marcaine. A K wire was then passed to the hardware which was identified by palpation then a series of dilator tubes was placed to allow placement of a 4 cm wide 18 mm diameter Metrix cannula. This was placed over the superior hardware first. Monopolar cautery and suction was used to identify the screw head. Then the cap was removed first starting on the left side removing the L5-S1. The rod was then freed by undermining it with a large hook and lifting it partially. It was removed the screws were then removed sequentially. Similar procedure was then carried out on the right side. When all the hardware was removed final fluoroscopic images identified removal of all hardware. Wounds were irrigated with  antibiotic irrigating solution and the fascia was closed with 2-0 Vicryl interrupted fashion and 3-0 Vicryl was used in the subcutaneous take her skin. Blood loss for the procedure was estimated about 20 mL. Patient tolerated the procedure well.

## 2016-05-02 NOTE — Discharge Summary (Signed)
Physician Discharge Summary  Patient ID: Hector [name] MRN: [id number] DOB/AGE: 05/14/1961 55 y.o.  Admit date: 05/02/2016 Discharge date: 05/02/2016  Admission Diagnoses:Pseudoarthrosis L5-S1 with chronic back pain, loose hardware  Discharge Diagnoses: Pseudoarthrosis L5-S1 with chronic back pain, loose hardware Active Problems:   * No active hospital problems. *   Discharged Condition: good  Hospital Course: Patient was admitted to undergo removal of hardware which was performed without difficulty  Consults: None  Significant Diagnostic Studies: None  Treatments: surgery: Removal of hardware using Metrix technique  Discharge Exam: Blood pressure (!) 147/94, pulse 80, temperature 98 F (36.7 C), resp. rate 11, height 5\' 10"  (1.778 m), weight 78.9 kg (174 lb), SpO2 98 %. Incisions are clean and dry motor function remains intact.  Disposition: 01-Home or Self Care  Discharge Instructions    Call MD for:  redness, tenderness, or signs of infection (pain, swelling, redness, odor or green/yellow discharge around incision site)    Complete by:  As directed   Call MD for:  severe uncontrolled pain    Complete by:  As directed   Call MD for:  temperature >100.4    Complete by:  As directed   Diet - low sodium heart healthy    Complete by:  As directed   Discharge instructions    Complete by:  As directed   Okay to shower. Do not apply salves or appointments to incision. No heavy lifting with the upper extremities greater than 15 pounds. May resume driving when not requiring pain medication and patient feels comfortable with doing so.   Increase activity slowly    Complete by:  As directed       Medication List    TAKE these medications   amitriptyline 50 MG tablet Commonly known as:  ELAVIL TAKE 1 TABLET AS NEEDED FOR SLEEP   cyclobenzaprine 10 MG tablet Commonly known as:  FLEXERIL Take 1 tablet by mouth 2 (two) times daily as needed.   docusate sodium 100 MG  capsule Commonly known as:  COLACE Take 1 capsule (100 mg total) by mouth 2 (two) times daily.   escitalopram 20 MG tablet Commonly known as:  LEXAPRO Take 20 mg by mouth every morning.   fluocinonide-emollient 0.05 % cream Commonly known as:  LIDEX-E Apply 1 application topically 2 (two) times daily. To poison oak not on face.   lamoTRIgine 100 MG tablet Commonly known as:  LAMICTAL Take 100-200 mg by mouth 2 (two) times daily. Take 200 mg in the am and 100 mg qhs.   multivitamin tablet Take 1 tablet by mouth daily.   ondansetron 4 MG tablet Commonly known as:  ZOFRAN Take 4 mg by mouth every 8 (eight) hours as needed for nausea or vomiting.   oxyCODONE-acetaminophen 5-325 MG tablet Commonly known as:  PERCOCET/ROXICET Take 1 tablet by mouth every 6 (six) hours as needed for moderate pain. What changed:  Another medication with the same name was added. Make sure you understand how and when to take each.   oxyCODONE-acetaminophen 5-325 MG tablet Commonly known as:  ROXICET Take 1-2 tablets by mouth every 4 (four) hours as needed for severe pain. What changed:  You were already taking a medication with the same name, and this prescription was added. Make sure you understand how and when to take each.   traZODone 100 MG tablet Commonly known as:  DESYREL Take 300 mg by mouth at bedtime.   TYLENOL ARTHRITIS PAIN 650 MG CR tablet Generic drug:  acetaminophen Take 650 mg by mouth every 8 (eight) hours as needed for pain.   vitamin B-12 1000 MCG tablet Commonly known as:  CYANOCOBALAMIN Take 1,000 mcg by mouth daily.   VITAMIN D-1000 MAX ST 1000 units tablet Generic drug:  Cholecalciferol Take 1,000 Units by mouth daily.        SignedEarleen Newport 05/02/2016, 9:22 AM

## 2016-05-02 NOTE — Anesthesia Postprocedure Evaluation (Signed)
Anesthesia Post Note  Patient: Hector [name]  Procedure(s) Performed: Procedure(s) (LRB): Removal of hardware Lumbar five-Sacral one with Metrex (N/A)  Patient location during evaluation: PACU Anesthesia Type: General Level of consciousness: awake and alert Pain management: pain level controlled Vital Signs Assessment: post-procedure vital signs reviewed and stable Respiratory status: spontaneous breathing, nonlabored ventilation, respiratory function stable and patient connected to nasal cannula oxygen Cardiovascular status: blood pressure returned to baseline and stable Postop Assessment: no signs of nausea or vomiting Anesthetic complications: no    Last Vitals:  Vitals:   05/02/16 0945 05/02/16 0946  BP:  (!) 140/93  Pulse: 77 78  Resp: 16 (!) 24  Temp:  36.7 C    Last Pain:  Vitals:   05/02/16 0639  TempSrc: Oral  PainSc:                  [name]

## 2016-05-02 NOTE — Anesthesia Procedure Notes (Signed)
Procedure Name: Intubation Date/Time: 05/02/2016 7:34 AM Performed by: Lavell Luster Pre-anesthesia Checklist: Patient identified, Emergency Drugs available, Suction available, Patient being monitored and Timeout performed Patient Re-evaluated:Patient Re-evaluated prior to inductionOxygen Delivery Method: Circle system utilized Preoxygenation: Pre-oxygenation with 100% oxygen Intubation Type: IV induction Ventilation: Mask ventilation without difficulty Laryngoscope Size: Mac and 4 Grade View: Grade I Tube type: Oral Tube size: 7.5 mm Number of attempts: 1 Airway Equipment and Method: Stylet Placement Confirmation: ETT inserted through vocal cords under direct vision,  positive ETCO2 and breath sounds checked- equal and bilateral Secured at: 22 cm Tube secured with: Tape Dental Injury: Teeth and Oropharynx as per pre-operative assessment

## 2016-05-02 NOTE — H&P (Signed)
CHIEF COMPLAINT:                              Back pain status post a fall about six weeks ago.                                    HOPI:                                                   Hector [name] is a 55 year old individual who has been a patient of Dr. Sande Rives. It seems that back in September of last year, he underwent decompression and fusion using a transforaminal technique with percutaneous screws at the L5-S1 level. The patient notes that he has had some element of chronic back pain. In January, he had a fall on the ice, and an MRI of the lumbar spine was performed. I had reviewed that at the time and noted that, intrinsically, everything appeared to be fairly healthy, no fractures were noted, and I suggested that with time his pain should subside. About six weeks ago, the patient notes that he had a fall from his tractor, which was at about a height of four feet. He fell flat onto his back and has aggravated pain. Since that time, he notes that he had a problem with a gallstone that caused some pancreatitis, and in the workup of that process, he had a CT scan of the lumbar spine. I have had the opportunity to review that, and I note that the patient appears to have not sustained any fractures in his lumbar spine. The alignment is quite anatomic. His fusion does appear to be healing at the L5-S1 level; albeit, the degree of bone that has been laid down in the interspace is modest. I noted these findings to him. I do note that in his back, he has the right-sided L5 screw appears to be in the medial aspect of the facet joint at L4-L5. The left-sided screws appear to be in good position. He notes that he gets central pain that radiates out to either side in either hip. Sitting for a long period of time aggravates substantial pain, such that driving any distance will aggravate pain. He is always with some degree of back pain, and he was hoping that something could be done to help with this process.   His  Past Medical History, Social History, and systems review remain unchanged other than the fact that he has had the recent bout of cholecystitis and pancreatitis. He notes that he has had a 17-pound weight loss since the beginning of July when this all started.           PHYSICAL EXAMINATION:                    In his physical exam, I note that he stands straight and erect without difficulty, but he does appear to feature some modest weakness in the tibialis anterior on the right side and the gastroc on the left side when he ambulates.     IMPRESSION/PLAN:  Having reviewed plain x-rays that we obtained in the office today that demonstrate that his arthrodesis appears to be solid, no adjacent fractures are noted, and the mobility between flexion and extension appears to be fairly normal, I have advised Hector [name] at this time that we should consider removal of the implanted hardware. This would remove the screw from the facet joint and hopefully lessen the back pain to some substantial degree. On the left side, one could consider leaving the hardware, but he does note that a fair amount of his pain is left-sided. I have suggested that we can remove all the hardware, and this would prevent the stress shielding of the arthrodesis at the L5-S1 level and perhaps allow that fusion to heal more heartily. I believe that this is the next most reasonable thing to do in his situation. I do not feel that we need to proceed with another MRI, and I am hopeful that removal of the hardware would yield some improvement in his overall pain status as regard to his back.

## 2016-05-02 NOTE — Transfer of Care (Signed)
Immediate Anesthesia Transfer of Care Note  Patient: Hector [name]  Procedure(s) Performed: Procedure(s) with comments: Removal of hardware Lumbar five-Sacral one with Metrex (N/A) - Removal of hardware L5-S1 with Metrex  Patient Location: PACU  Anesthesia Type:General  Level of Consciousness: awake, alert  and oriented  Airway & Oxygen Therapy: Patient connected to face mask oxygen  Post-op Assessment: Post -op Vital signs reviewed and stable  Post vital signs: stable  Last Vitals:  Vitals:   05/02/16 0639  BP: (!) 147/94  Pulse: 68  Resp: 16  Temp: 36.9 C    Last Pain:  Vitals:   05/02/16 0639  TempSrc: Oral  PainSc:       Patients Stated Pain Goal: 5 ([id number])  Complications: No apparent anesthesia complications

## 2016-05-05 ENCOUNTER — Encounter (HOSPITAL_COMMUNITY): Payer: Self-pay | Admitting: Neurological Surgery

## 2016-06-18 IMAGING — DX DG SPINE 1V PORT
1 series · 1 of 1 positions shown · non-contrast
Comparison: 06/05/2014

CLINICAL DATA: Micro lumbar decompression at L5-S 1. Foramina of
me. Synovial cyst excision.

EXAM:
PORTABLE SPINE - 1 VIEW

[l-spine lat]
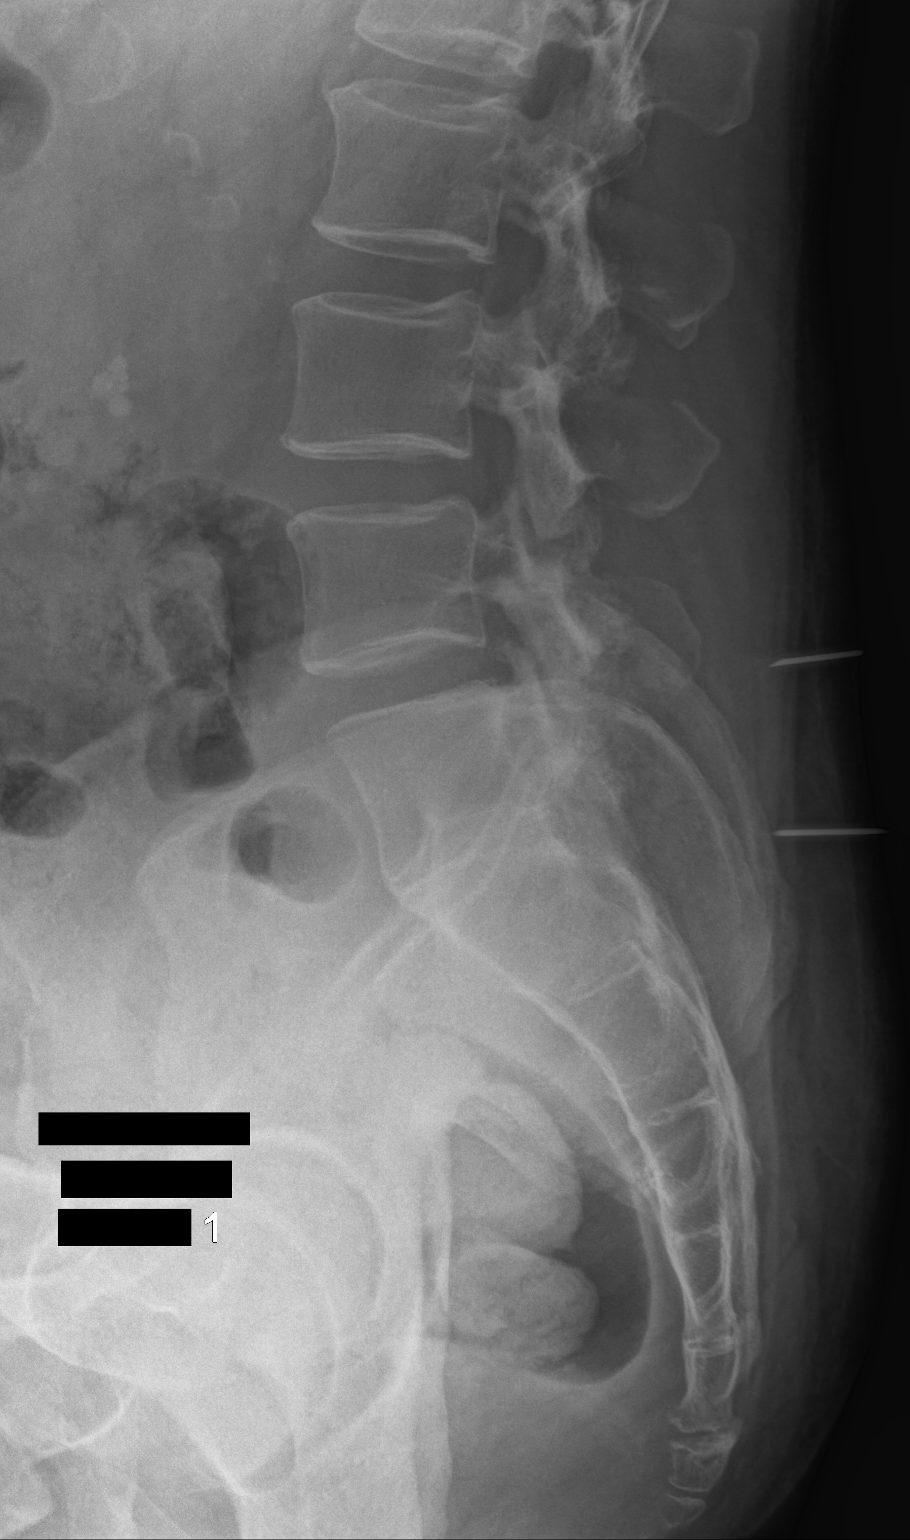

[1 of 1 positions shown; findings below may reference images not displayed]

FINDINGS: As on the prior exam, the transitional lumbosacral vertebra is
labeled L5. The L5 spinous process is very hypoplastic with evidence
of spina bifida occulta.

The top needle type probe is oriented towards the bottom of the L4
spinous process. The lower needle is oriented towards the L5-S1
intervertebral level.
IMPRESSION: 1. Lower needle orientation: L5-S 1. Upper needle orientation:
Bottom of the L4 spinous process. L5 is transitional with a
hypoplastic spinous process compatible with spina bifida occulta at
L5.

## 2016-06-20 ENCOUNTER — Encounter (HOSPITAL_COMMUNITY): Payer: Self-pay

## 2016-07-05 DIAGNOSIS — K080 Exfoliation of teeth due to systemic causes: Secondary | ICD-10-CM | POA: Diagnosis not present

## 2016-07-08 DIAGNOSIS — K080 Exfoliation of teeth due to systemic causes: Secondary | ICD-10-CM | POA: Diagnosis not present

## 2016-07-09 DIAGNOSIS — K080 Exfoliation of teeth due to systemic causes: Secondary | ICD-10-CM | POA: Diagnosis not present

## 2016-07-10 ENCOUNTER — Encounter: Payer: Self-pay | Admitting: Podiatry

## 2016-07-10 ENCOUNTER — Ambulatory Visit: Payer: Federal, State, Local not specified - PPO

## 2016-07-10 ENCOUNTER — Ambulatory Visit (INDEPENDENT_AMBULATORY_CARE_PROVIDER_SITE_OTHER): Payer: Federal, State, Local not specified - PPO

## 2016-07-10 ENCOUNTER — Ambulatory Visit (INDEPENDENT_AMBULATORY_CARE_PROVIDER_SITE_OTHER): Payer: Federal, State, Local not specified - PPO | Admitting: Podiatry

## 2016-07-10 VITALS — BP 142/78 | HR 78

## 2016-07-10 DIAGNOSIS — M7752 Other enthesopathy of left foot: Secondary | ICD-10-CM | POA: Diagnosis not present

## 2016-07-10 DIAGNOSIS — M79674 Pain in right toe(s): Secondary | ICD-10-CM

## 2016-07-10 DIAGNOSIS — M778 Other enthesopathies, not elsewhere classified: Secondary | ICD-10-CM

## 2016-07-10 DIAGNOSIS — M79672 Pain in left foot: Secondary | ICD-10-CM

## 2016-07-10 DIAGNOSIS — M79671 Pain in right foot: Secondary | ICD-10-CM

## 2016-07-10 DIAGNOSIS — M779 Enthesopathy, unspecified: Secondary | ICD-10-CM

## 2016-07-10 MED ORDER — NONFORMULARY OR COMPOUNDED ITEM: 1.0000 g | Freq: Four times a day (QID) | 2 refills | Status: DC

## 2016-07-10 NOTE — Progress Notes (Signed)
   Subjective:    Patient ID: Hector [name], male    DOB: 1960-09-24, 55 y.o.   MRN: [id number]  HPI    Review of Systems  Gastrointestinal: Positive for abdominal pain, diarrhea and nausea.  Skin: Positive for rash.  Neurological: Positive for weakness, light-headedness and headaches.  Psychiatric/Behavioral: The patient is nervous/anxious.   All other systems reviewed and are negative.      Objective:   Physical Exam        Assessment & Plan:

## 2016-07-11 DIAGNOSIS — F3132 Bipolar disorder, current episode depressed, moderate: Secondary | ICD-10-CM | POA: Diagnosis not present

## 2016-07-20 MED ORDER — BETAMETHASONE SOD PHOS & ACET 6 (3-3) MG/ML IJ SUSP: 3.0000 mg | Freq: Once | INTRAMUSCULAR | Status: DC

## 2016-07-20 NOTE — Progress Notes (Signed)
Patient ID: Hector [name], male   DOB: 1961-08-01, 55 y.o.   MRN: [id number] Subjective:  Patient presents today for evaluation of left foot pain that is been going on for 3-4 weeks now. Patient has noticed bruising and swelling to that area for the past few weeks. Patient does have a history of bunion surgery with Dr. Paulla Dolly several years ago. She also states she has a history of gout Patient presents today for further treatment and evaluation    Objective/Physical Exam General: The patient is alert and oriented x3 in no acute distress.  Dermatology: Skin is warm, dry and supple bilateral lower extremities. Negative for open lesions or macerations.  Vascular: Palpable pedal pulses bilaterally. No edema or erythema noted. Capillary refill within normal limits.  Neurological: Epicritic and protective threshold grossly intact bilaterally.   Musculoskeletal Exam: Range of motion within normal limits to all pedal and ankle joints bilateral. Muscle strength 5/5 in all groups bilateral.   Radiographic Exam:  Normal osseous mineralization. Joint spaces preserved. No fracture/dislocation/boney destruction.    Assessment: #1 enthesopathy left foot #2 possible stress fracture fifth metatarsal left foot #3 capsulitis fifth MPJ left foot #4 pain in left foot   Plan of Care:  #1 Patient was evaluated. #2 injection of 0.5 mL Celestone Soluspan injected in the patient's lateral Lisfranc joint and calcaneocuboid joint left foot #3 Recommend compression anklet #4 prescription for anti-plantar pain cream through [facility] #5 return to clinic in 4 weeks  Dr. Edrick Kins, [place]

## 2016-07-21 DIAGNOSIS — F4310 Post-traumatic stress disorder, unspecified: Secondary | ICD-10-CM | POA: Diagnosis not present

## 2016-07-22 DIAGNOSIS — F411 Generalized anxiety disorder: Secondary | ICD-10-CM | POA: Diagnosis not present

## 2016-07-23 DIAGNOSIS — M545 Low back pain: Secondary | ICD-10-CM | POA: Diagnosis not present

## 2016-07-23 DIAGNOSIS — N200 Calculus of kidney: Secondary | ICD-10-CM | POA: Diagnosis not present

## 2016-07-25 DIAGNOSIS — F3132 Bipolar disorder, current episode depressed, moderate: Secondary | ICD-10-CM | POA: Diagnosis not present

## 2016-07-27 ENCOUNTER — Other Ambulatory Visit: Payer: Self-pay | Admitting: Internal Medicine

## 2016-07-31 DIAGNOSIS — M5416 Radiculopathy, lumbar region: Secondary | ICD-10-CM | POA: Diagnosis not present

## 2016-08-06 ENCOUNTER — Ambulatory Visit (INDEPENDENT_AMBULATORY_CARE_PROVIDER_SITE_OTHER): Payer: Federal, State, Local not specified - PPO | Admitting: Podiatry

## 2016-08-06 DIAGNOSIS — M205X1 Other deformities of toe(s) (acquired), right foot: Secondary | ICD-10-CM

## 2016-08-06 DIAGNOSIS — M779 Enthesopathy, unspecified: Secondary | ICD-10-CM | POA: Diagnosis not present

## 2016-08-06 DIAGNOSIS — M1000 Idiopathic gout, unspecified site: Secondary | ICD-10-CM

## 2016-08-06 MED ORDER — TRIAMCINOLONE ACETONIDE 10 MG/ML IJ SUSP
10.0000 mg | Freq: Once | INTRAMUSCULAR | Status: AC
  Administered 2016-08-06: 10 mg

## 2016-08-06 NOTE — Progress Notes (Signed)
Subjective:     Patient ID: Hector [name], male   DOB: Nov 27, 1960, 55 y.o.   MRN: [id number]  HPI patient states I still have a lot of pain in the outside of my left foot and it's swelling and also my big toe joint on my right foot is bothering her more and I need to consider surgery on a by the end of the year but would have to do on a Friday and be able to return to work on Tuesday   Review of Systems     Objective:   Physical Exam Vascular status intact with patient found have inflammation of the peroneal segment left with +1 and +2 pitting edema in the left lateral foot with negative Homans sign noted. 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 At this point I did a careful sheath injection left 3 Milligan Kenalog 5 mill grams Xylocaine and applied an Haematologist Ace wrap surgical shoe to reduce the edema and I'm getting get blood work to rule out pathology. For the right foot discussed surgery and we will review this again in 1 week when I get the results of blood work and I can see how he responded. Reappoint at that time

## 2016-08-12 DIAGNOSIS — K080 Exfoliation of teeth due to systemic causes: Secondary | ICD-10-CM | POA: Diagnosis not present

## 2016-08-13 ENCOUNTER — Encounter: Payer: Self-pay | Admitting: Podiatry

## 2016-08-13 ENCOUNTER — Ambulatory Visit (INDEPENDENT_AMBULATORY_CARE_PROVIDER_SITE_OTHER): Payer: Federal, State, Local not specified - PPO | Admitting: Podiatry

## 2016-08-13 DIAGNOSIS — M205X1 Other deformities of toe(s) (acquired), right foot: Secondary | ICD-10-CM

## 2016-08-13 DIAGNOSIS — M779 Enthesopathy, unspecified: Secondary | ICD-10-CM | POA: Diagnosis not present

## 2016-08-13 MED ORDER — OXYCODONE-ACETAMINOPHEN 10-325 MG PO TABS: 1.0000 | ORAL_TABLET | Freq: Three times a day (TID) | ORAL | 0 refills | Status: DC | PRN

## 2016-08-13 NOTE — Patient Instructions (Signed)
Pre-Operative Instructions  Congratulations, you have decided to take an important step to improving your quality of life.  You can be assured that the doctors of [facility] will be with you every step of the way.  1. Plan to be at the surgery center/hospital at least 1 (one) hour prior to your scheduled time unless otherwise directed by the surgical center/hospital staff.  You must have a responsible adult accompany you, remain during the surgery and drive you home.  Make sure you have directions to the surgical center/hospital and know how to get there on time. 2. For hospital based surgery you will need to obtain a history and physical form from your family physician within 1 month prior to the date of surgery- we will give you a form for you primary physician.  3. We make every effort to accommodate the date you request for surgery.  There are however, times where surgery dates or times have to be moved.  We will contact you as soon as possible if a change in schedule is required.   4. No Aspirin/Ibuprofen for one week before surgery.  If you are on aspirin, any non-steroidal anti-inflammatory medications (Mobic, Aleve, Ibuprofen) you should stop taking it 7 days prior to your surgery.  You make take Tylenol  For pain prior to surgery.  5. Medications- If you are taking daily heart and blood pressure medications, seizure, reflux, allergy, asthma, anxiety, pain or diabetes medications, make sure the surgery center/hospital is aware before the day of surgery so they may notify you which medications to take or avoid the day of surgery. 6. No food or drink after midnight the night before surgery unless directed otherwise by surgical center/hospital staff. 7. No alcoholic beverages 24 hours prior to surgery.  No smoking 24 hours prior to or 24 hours after surgery. 8. Wear loose pants or shorts- loose enough to fit over bandages, boots, and casts. 9. No slip on shoes, sneakers are best. 10. Bring  your boot with you to the surgery center/hospital.  Also bring crutches or a walker if your physician has prescribed it for you.  If you do not have this equipment, it will be provided for you after surgery. 11. If you have not been contracted by the surgery center/hospital by the day before your surgery, call to confirm the date and time of your surgery. 12. Leave-time from work may vary depending on the type of surgery you have.  Appropriate arrangements should be made prior to surgery with your employer. 13. Prescriptions will be provided immediately following surgery by your doctor.  Have these filled as soon as possible after surgery and take the medication as directed. 14. Remove nail polish on the operative foot. 15. Wash the night before surgery.  The night before surgery wash the foot and leg well with the antibacterial soap provided and water paying special attention to beneath the toenails and in between the toes.  Rinse thoroughly with water and dry well with a towel.  Perform this wash unless told not to do so by your physician.  Enclosed: 1 Ice pack (please put in freezer the night before surgery)   1 Hibiclens skin cleaner   Pre-op Instructions  If you have any questions regarding the instructions, do not hesitate to call our office.  [place]: [address] [place], [place] [address] [phone]  [place]: [address] , [place] [address] [phone]  [place]: [address]  [place], [place] [address] [phone]   Dr.   Lorretta Kerce DPM, Dr. Matthew Wagoner DPM, Dr. M. Todd Hyatt DPM, Dr. Titorya Stover DPM 

## 2016-08-14 NOTE — Progress Notes (Signed)
Subjective:     Patient ID: Hector [name], male   DOB: Dec 11, 1960, 55 y.o.   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 Patient wants to get this done and I allowed him to read consent form for surgery going over all possible complications as listed and the fact that the joint may someday need to be fused are replaced with silicone. He understands all this and understands recovery can take 6 months to one year and after appropriate time to read over consent he signed consent and is given all preoperative instructions

## 2016-08-18 DIAGNOSIS — M1000 Idiopathic gout, unspecified site: Secondary | ICD-10-CM | POA: Diagnosis not present

## 2016-08-19 LAB — URIC ACID: Uric Acid, Serum: 6.4 mg/dL (ref 4.0–8.0)

## 2016-08-19 LAB — ANA, IFA COMPREHENSIVE PANEL
ANA: NEGATIVE
ENA SM Ab Ser-aCnc: <1
SM/RNP: <1
SSA (Ro) (ENA) Antibody, IgG: <1
SSB (La) (ENA) Antibody, IgG: <1
Scleroderma (Scl-70) (ENA) Antibody, IgG: <1

## 2016-08-19 LAB — SEDIMENTATION RATE: SED RATE: 1 mm/h (ref 0–20)

## 2016-08-19 LAB — RHEUMATOID FACTOR

## 2016-08-19 LAB — C-REACTIVE PROTEIN: CRP: 5.7 mg/L (ref <8.0)

## 2016-08-25 DIAGNOSIS — K080 Exfoliation of teeth due to systemic causes: Secondary | ICD-10-CM | POA: Diagnosis not present

## 2016-08-28 ENCOUNTER — Encounter: Payer: Self-pay | Admitting: Podiatry

## 2016-08-28 DIAGNOSIS — M2011 Hallux valgus (acquired), right foot: Secondary | ICD-10-CM | POA: Diagnosis not present

## 2016-08-28 DIAGNOSIS — G4733 Obstructive sleep apnea (adult) (pediatric): Secondary | ICD-10-CM | POA: Diagnosis not present

## 2016-08-28 DIAGNOSIS — M2021 Hallux rigidus, right foot: Secondary | ICD-10-CM | POA: Diagnosis not present

## 2016-08-29 ENCOUNTER — Telehealth: Payer: Self-pay

## 2016-08-29 NOTE — Telephone Encounter (Signed)
Spoke with pt regarding post op status. Advised him to utilize pain medication on a schedule basis along with layering ibuprofen in between doses. Advised him to not drive in the boot, he is to drive in a slipper or regular shoe, but I did advise him against driving at all until his first post op appt. He stated that he changed his original first post op appt for an appt with a later date. I advised him that this was longer than we recommend waiting to be evaluated by the doctor. He stated that he wanted to keep it this way due to family being in town for the holiday. Advised on boot usage, ice and elevation. Advised to watch for s/s of infection.Advised on loosening ace bandage to help with pain, but to leave gauze intact. Any other questions or concerns he is to call or come in to the office. Also a limitations letter was faxed to his employer at [phone]

## 2016-09-01 NOTE — Progress Notes (Signed)
DOS 12.14.2017 Bi-Planar [place] with Fixation Right Foot

## 2016-09-04 ENCOUNTER — Encounter: Payer: Federal, State, Local not specified - PPO | Admitting: Podiatry

## 2016-09-09 ENCOUNTER — Other Ambulatory Visit: Payer: Self-pay

## 2016-09-09 MED ORDER — OXYCODONE-ACETAMINOPHEN 10-325 MG PO TABS
1.0000 | ORAL_TABLET | Freq: Four times a day (QID) | ORAL | 0 refills | Status: DC | PRN
Start: 2016-09-09 — End: 2016-11-24

## 2016-09-11 ENCOUNTER — Ambulatory Visit (INDEPENDENT_AMBULATORY_CARE_PROVIDER_SITE_OTHER): Payer: Federal, State, Local not specified - PPO | Admitting: Podiatry

## 2016-09-11 ENCOUNTER — Ambulatory Visit (INDEPENDENT_AMBULATORY_CARE_PROVIDER_SITE_OTHER): Payer: Federal, State, Local not specified - PPO

## 2016-09-11 ENCOUNTER — Encounter: Payer: Self-pay | Admitting: Podiatry

## 2016-09-11 VITALS — BP 139/94 | HR 82 | Temp 99.0°F

## 2016-09-11 DIAGNOSIS — M2011 Hallux valgus (acquired), right foot: Secondary | ICD-10-CM | POA: Diagnosis not present

## 2016-09-11 DIAGNOSIS — M205X1 Other deformities of toe(s) (acquired), right foot: Secondary | ICD-10-CM

## 2016-09-12 NOTE — Progress Notes (Signed)
Subjective:     Patient ID: Hector [name], male   DOB: Oct 16, 1960, 55 y.o.   MRN: [id number]  HPI patient presents right foot stating it's doing well but I do get some popping in there and I wanted checked   Review of Systems     Objective:   Physical Exam Neurovascular status intact negative Homans sign was noted with patient's incision site right first metatarsal well coapted with excellent range of motion of the first MPJ no crepitus or other indications of pathology    Assessment:     Doing well post osteotomy first metatarsal right with mild popping which is probably fluid accumulation and no indications of acute pathology    Plan:     H&P x-rays reviewed and advised this patient on continued surgical shoe and boot usage with complete immobilization continued range of motion exercises compression and elevation. Reappoint in 4 weeks or earlier if needed  X-ray report indicate pins are in place osteotomy is healing well with good alignment and no indications of pathology of bone surface

## 2016-09-16 DIAGNOSIS — F3132 Bipolar disorder, current episode depressed, moderate: Secondary | ICD-10-CM | POA: Diagnosis not present

## 2016-09-19 DIAGNOSIS — G4733 Obstructive sleep apnea (adult) (pediatric): Secondary | ICD-10-CM | POA: Diagnosis not present

## 2016-09-19 DIAGNOSIS — G8929 Other chronic pain: Secondary | ICD-10-CM | POA: Diagnosis not present

## 2016-09-19 DIAGNOSIS — J3089 Other allergic rhinitis: Secondary | ICD-10-CM | POA: Diagnosis not present

## 2016-09-19 DIAGNOSIS — E119 Type 2 diabetes mellitus without complications: Secondary | ICD-10-CM | POA: Diagnosis not present

## 2016-09-19 DIAGNOSIS — Z981 Arthrodesis status: Secondary | ICD-10-CM | POA: Diagnosis not present

## 2016-09-19 DIAGNOSIS — Z9884 Bariatric surgery status: Secondary | ICD-10-CM | POA: Diagnosis not present

## 2016-09-19 DIAGNOSIS — F319 Bipolar disorder, unspecified: Secondary | ICD-10-CM | POA: Diagnosis not present

## 2016-09-19 DIAGNOSIS — Z886 Allergy status to analgesic agent status: Secondary | ICD-10-CM | POA: Diagnosis not present

## 2016-09-19 DIAGNOSIS — Z79899 Other long term (current) drug therapy: Secondary | ICD-10-CM | POA: Diagnosis not present

## 2016-09-19 DIAGNOSIS — G4700 Insomnia, unspecified: Secondary | ICD-10-CM | POA: Diagnosis not present

## 2016-09-19 DIAGNOSIS — F4310 Post-traumatic stress disorder, unspecified: Secondary | ICD-10-CM | POA: Diagnosis not present

## 2016-09-19 DIAGNOSIS — J3489 Other specified disorders of nose and nasal sinuses: Secondary | ICD-10-CM | POA: Diagnosis not present

## 2016-09-19 DIAGNOSIS — L719 Rosacea, unspecified: Secondary | ICD-10-CM | POA: Diagnosis not present

## 2016-09-19 DIAGNOSIS — Z87442 Personal history of urinary calculi: Secondary | ICD-10-CM | POA: Diagnosis not present

## 2016-09-19 DIAGNOSIS — I10 Essential (primary) hypertension: Secondary | ICD-10-CM | POA: Diagnosis not present

## 2016-09-19 DIAGNOSIS — Z9049 Acquired absence of other specified parts of digestive tract: Secondary | ICD-10-CM | POA: Diagnosis not present

## 2016-10-13 ENCOUNTER — Ambulatory Visit: Payer: Federal, State, Local not specified - PPO

## 2016-10-20 DIAGNOSIS — F3132 Bipolar disorder, current episode depressed, moderate: Secondary | ICD-10-CM | POA: Diagnosis not present

## 2016-10-23 ENCOUNTER — Telehealth: Payer: Self-pay | Admitting: Family Medicine

## 2016-10-23 ENCOUNTER — Other Ambulatory Visit: Payer: Self-pay | Admitting: Internal Medicine

## 2016-10-23 ENCOUNTER — Other Ambulatory Visit: Payer: Self-pay | Admitting: Family Medicine

## 2016-10-23 DIAGNOSIS — E538 Deficiency of other specified B group vitamins: Secondary | ICD-10-CM

## 2016-10-23 DIAGNOSIS — E559 Vitamin D deficiency, unspecified: Secondary | ICD-10-CM

## 2016-10-23 DIAGNOSIS — Z0000 Encounter for general adult medical examination without abnormal findings: Secondary | ICD-10-CM

## 2016-10-23 DIAGNOSIS — E119 Type 2 diabetes mellitus without complications: Secondary | ICD-10-CM

## 2016-10-23 NOTE — Telephone Encounter (Signed)
Sent to the pharmacy by e-scribe.  Message sent to scheduling. 

## 2016-10-23 NOTE — Telephone Encounter (Signed)
Pt states he will callback to sch

## 2016-10-23 NOTE — Telephone Encounter (Signed)
Can refill x 1 only   Make appt  2 slots   For yearly evaluation  Before April  To be done before runs out of med   Lab Results  Component Value Date   WBC 4.4 04/21/2016   HGB 15.1 04/21/2016   HCT 46.5 04/21/2016   PLT 131 (L) 04/21/2016   GLUCOSE 113 (H) 04/21/2016   CHOL 101 03/20/2016   TRIG 120 03/20/2016   HDL 34 (L) 03/20/2016   LDLDIRECT 113.2 04/01/2007   LDLCALC 43 03/20/2016   ALT 12 (L) 04/21/2016   AST 18 04/21/2016   NA 142 04/21/2016   K 4.3 04/21/2016   CL 105 04/21/2016   CREATININE 1.21 04/21/2016   BUN 10 04/21/2016   CO2 30 04/21/2016   TSH 0.93 01/07/2016   PSA 0.76 04/09/2015   INR 1.07 03/20/2016   HGBA1C 5.7 (H) 04/21/2016   MICROALBUR 0.6 05/15/2014

## 2016-10-23 NOTE — Telephone Encounter (Signed)
Medication filled for 1 month.  Dr. Mamie Nick would like for the pt to have labs and yearly in the next month.  Please help him to make that appt.  I have ordered labs.  Thanks!!

## 2016-11-01 ENCOUNTER — Other Ambulatory Visit: Payer: Self-pay | Admitting: Internal Medicine

## 2016-11-03 NOTE — Telephone Encounter (Signed)
Pt still has not scheduled follow up.  Please advise.

## 2016-11-04 ENCOUNTER — Telehealth: Payer: Self-pay | Admitting: Family Medicine

## 2016-11-04 NOTE — Telephone Encounter (Signed)
See last notes  Schedule  ov extended visit  30 minutes    Then can refill for 30 more  Days only

## 2016-11-04 NOTE — Telephone Encounter (Signed)
#  30 Sent to the pharmacy.  Message sent to scheduling.

## 2016-11-04 NOTE — Telephone Encounter (Signed)
Pt due for follow up within 30 days.  Please help pt schedule.  Dr. [name] is requesting 30 minutes.  Thanks!!

## 2016-11-05 NOTE — Telephone Encounter (Signed)
Pt has been sch

## 2016-11-24 ENCOUNTER — Inpatient Hospital Stay (HOSPITAL_COMMUNITY)
Admission: EM | Admit: 2016-11-24 | Discharge: 2016-11-25 | DRG: 872 | Disposition: A | Discharge status: Home / Self Care | Payer: Federal, State, Local not specified - PPO | Attending: Internal Medicine | Admitting: Internal Medicine

## 2016-11-24 ENCOUNTER — Encounter (HOSPITAL_COMMUNITY): Payer: Self-pay | Admitting: *Deleted

## 2016-11-24 ENCOUNTER — Emergency Department (HOSPITAL_COMMUNITY): Payer: Federal, State, Local not specified - PPO

## 2016-11-24 DIAGNOSIS — L270 Generalized skin eruption due to drugs and medicaments taken internally: Secondary | ICD-10-CM | POA: Diagnosis present

## 2016-11-24 DIAGNOSIS — G8929 Other chronic pain: Secondary | ICD-10-CM | POA: Diagnosis present

## 2016-11-24 DIAGNOSIS — D696 Thrombocytopenia, unspecified: Secondary | ICD-10-CM | POA: Diagnosis not present

## 2016-11-24 DIAGNOSIS — E876 Hypokalemia: Secondary | ICD-10-CM | POA: Diagnosis present

## 2016-11-24 DIAGNOSIS — M5442 Lumbago with sciatica, left side: Secondary | ICD-10-CM | POA: Diagnosis present

## 2016-11-24 DIAGNOSIS — K219 Gastro-esophageal reflux disease without esophagitis: Secondary | ICD-10-CM | POA: Diagnosis not present

## 2016-11-24 DIAGNOSIS — M5441 Lumbago with sciatica, right side: Secondary | ICD-10-CM

## 2016-11-24 DIAGNOSIS — D61818 Other pancytopenia: Secondary | ICD-10-CM | POA: Diagnosis not present

## 2016-11-24 DIAGNOSIS — K645 Perianal venous thrombosis: Secondary | ICD-10-CM | POA: Diagnosis not present

## 2016-11-24 DIAGNOSIS — K648 Other hemorrhoids: Secondary | ICD-10-CM | POA: Diagnosis present

## 2016-11-24 DIAGNOSIS — R197 Diarrhea, unspecified: Secondary | ICD-10-CM | POA: Diagnosis not present

## 2016-11-24 DIAGNOSIS — G4733 Obstructive sleep apnea (adult) (pediatric): Secondary | ICD-10-CM | POA: Diagnosis present

## 2016-11-24 DIAGNOSIS — R739 Hyperglycemia, unspecified: Secondary | ICD-10-CM | POA: Diagnosis present

## 2016-11-24 DIAGNOSIS — T368X5A Adverse effect of other systemic antibiotics, initial encounter: Secondary | ICD-10-CM | POA: Diagnosis present

## 2016-11-24 DIAGNOSIS — M545 Low back pain, unspecified: Secondary | ICD-10-CM | POA: Diagnosis present

## 2016-11-24 DIAGNOSIS — R1084 Generalized abdominal pain: Secondary | ICD-10-CM | POA: Diagnosis not present

## 2016-11-24 DIAGNOSIS — N179 Acute kidney failure, unspecified: Secondary | ICD-10-CM | POA: Diagnosis not present

## 2016-11-24 DIAGNOSIS — E785 Hyperlipidemia, unspecified: Secondary | ICD-10-CM | POA: Diagnosis not present

## 2016-11-24 DIAGNOSIS — R109 Unspecified abdominal pain: Secondary | ICD-10-CM | POA: Diagnosis not present

## 2016-11-24 DIAGNOSIS — A084 Viral intestinal infection, unspecified: Secondary | ICD-10-CM | POA: Diagnosis not present

## 2016-11-24 DIAGNOSIS — K529 Noninfective gastroenteritis and colitis, unspecified: Secondary | ICD-10-CM | POA: Diagnosis not present

## 2016-11-24 DIAGNOSIS — Z9884 Bariatric surgery status: Secondary | ICD-10-CM | POA: Diagnosis not present

## 2016-11-24 DIAGNOSIS — A419 Sepsis, unspecified organism: Principal | ICD-10-CM | POA: Diagnosis present

## 2016-11-24 DIAGNOSIS — E860 Dehydration: Secondary | ICD-10-CM | POA: Diagnosis not present

## 2016-11-24 DIAGNOSIS — Z79899 Other long term (current) drug therapy: Secondary | ICD-10-CM | POA: Diagnosis not present

## 2016-11-24 DIAGNOSIS — F439 Reaction to severe stress, unspecified: Secondary | ICD-10-CM | POA: Diagnosis present

## 2016-11-24 LAB — I-STAT CG4 LACTIC ACID, ED
LACTIC ACID, VENOUS: 4.36 mmol/L — AB (ref 0.5–1.9)
Lactic Acid, Venous: 0.99 mmol/L (ref 0.5–1.9)

## 2016-11-24 LAB — CBC
HCT: 38.8 % — ABNORMAL LOW (ref 39.0–52.0)
HEMOGLOBIN: 13.5 g/dL (ref 13.0–17.0)
MCH: 32.5 pg (ref 26.0–34.0)
MCHC: 34.8 g/dL (ref 30.0–36.0)
MCV: 93.5 fL (ref 78.0–100.0)
Platelets: 139 10*3/uL — ABNORMAL LOW (ref 150–400)
RBC: 4.15 MIL/uL — AB (ref 4.22–5.81)
RDW: 13 % (ref 11.5–15.5)
WBC: 3.5 10*3/uL — ABNORMAL LOW (ref 4.0–10.5)

## 2016-11-24 LAB — COMPREHENSIVE METABOLIC PANEL
ALT: 9 U/L — ABNORMAL LOW (ref 17–63)
ANION GAP: 10 (ref 5–15)
AST: 24 U/L (ref 15–41)
Albumin: 3.7 g/dL (ref 3.5–5.0)
Alkaline Phosphatase: 64 U/L (ref 38–126)
BUN: 13 mg/dL (ref 6–20)
CHLORIDE: 104 mmol/L (ref 101–111)
CO2: 22 mmol/L (ref 22–32)
Calcium: 8.7 mg/dL — ABNORMAL LOW (ref 8.9–10.3)
Creatinine, Ser: 1.3 mg/dL — ABNORMAL HIGH (ref 0.61–1.24)
GFR calc non Af Amer: >60 mL/min (ref >60)
Glucose, Bld: 243 mg/dL — ABNORMAL HIGH (ref 65–99)
Potassium: 3.3 mmol/L — ABNORMAL LOW (ref 3.5–5.1)
SODIUM: 136 mmol/L (ref 135–145)
Total Bilirubin: 0.5 mg/dL (ref 0.3–1.2)
Total Protein: 6.4 g/dL — ABNORMAL LOW (ref 6.5–8.1)

## 2016-11-24 LAB — URINALYSIS, ROUTINE W REFLEX MICROSCOPIC
Bilirubin Urine: NEGATIVE
GLUCOSE, UA: NEGATIVE mg/dL
HGB URINE DIPSTICK: NEGATIVE
Ketones, ur: NEGATIVE mg/dL
LEUKOCYTES UA: NEGATIVE
Nitrite: NEGATIVE
Protein, ur: NEGATIVE mg/dL
Specific Gravity, Urine: 1.028 (ref 1.005–1.030)
pH: 5 (ref 5.0–8.0)

## 2016-11-24 LAB — C DIFFICILE QUICK SCREEN W PCR REFLEX
C DIFFICILE (CDIFF) INTERP: NOT DETECTED
C DIFFICILE (CDIFF) TOXIN: NEGATIVE
C DIFFICLE (CDIFF) ANTIGEN: NEGATIVE

## 2016-11-24 LAB — GLUCOSE, CAPILLARY
GLUCOSE-CAPILLARY: 110 mg/dL — AB (ref 65–99)
GLUCOSE-CAPILLARY: 98 mg/dL (ref 65–99)
GLUCOSE-CAPILLARY: 97 mg/dL (ref 65–99)

## 2016-11-24 LAB — LIPASE, BLOOD: LIPASE: 14 U/L (ref 11–51)

## 2016-11-24 MED ORDER — ACETAMINOPHEN 325 MG PO TABS: 650.0000 mg | ORAL_TABLET | Freq: Four times a day (QID) | ORAL | Status: DC | PRN

## 2016-11-24 MED ORDER — ENOXAPARIN SODIUM 40 MG/0.4ML ~~LOC~~ SOLN
40.0000 mg | SUBCUTANEOUS | Status: DC
  Administered 2016-11-24: 40 mg via SUBCUTANEOUS
  Filled 2016-11-24: qty 0.4

## 2016-11-24 MED ORDER — DIPHENHYDRAMINE HCL 50 MG/ML IJ SOLN
25.0000 mg | Freq: Once | INTRAMUSCULAR | Status: AC
  Administered 2016-11-24: 25 mg via INTRAVENOUS
  Filled 2016-11-24: qty 1

## 2016-11-24 MED ORDER — SODIUM CHLORIDE 0.9 % IV BOLUS (SEPSIS)
500.0000 mL | Freq: Once | INTRAVENOUS | Status: AC
  Administered 2016-11-24: 500 mL via INTRAVENOUS

## 2016-11-24 MED ORDER — PIPERACILLIN-TAZOBACTAM 3.375 G IVPB
3.3750 g | Freq: Three times a day (TID) | INTRAVENOUS | Status: DC
  Administered 2016-11-24 – 2016-11-25 (×3): 3.375 g via INTRAVENOUS
  Filled 2016-11-24 (×5): qty 50

## 2016-11-24 MED ORDER — CYCLOBENZAPRINE HCL 10 MG PO TABS: 10.0000 mg | ORAL_TABLET | Freq: Two times a day (BID) | ORAL | Status: DC | PRN

## 2016-11-24 MED ORDER — VANCOMYCIN HCL IN DEXTROSE 1-5 GM/200ML-% IV SOLN: 1000.0000 mg | Freq: Two times a day (BID) | INTRAVENOUS | Status: DC

## 2016-11-24 MED ORDER — VANCOMYCIN HCL IN DEXTROSE 1-5 GM/200ML-% IV SOLN
1000.0000 mg | Freq: Once | INTRAVENOUS | Status: AC
  Administered 2016-11-24: 1000 mg via INTRAVENOUS
  Filled 2016-11-24: qty 200

## 2016-11-24 MED ORDER — VITAMIN B-12 1000 MCG PO TABS
1000.0000 ug | ORAL_TABLET | Freq: Every day | ORAL | Status: DC
  Administered 2016-11-25: 1000 ug via ORAL
  Filled 2016-11-24: qty 1

## 2016-11-24 MED ORDER — VITAMIN D3 25 MCG (1000 UNIT) PO TABS
1000.0000 [IU] | ORAL_TABLET | Freq: Every day | ORAL | Status: DC
  Administered 2016-11-24 – 2016-11-25 (×2): 1000 [IU] via ORAL
  Filled 2016-11-24 (×2): qty 1

## 2016-11-24 MED ORDER — IOPAMIDOL (ISOVUE-300) INJECTION 61%
INTRAVENOUS | Status: AC
  Filled 2016-11-24: qty 100

## 2016-11-24 MED ORDER — ACETAMINOPHEN 650 MG RE SUPP: 650.0000 mg | Freq: Four times a day (QID) | RECTAL | Status: DC | PRN

## 2016-11-24 MED ORDER — ONDANSETRON HCL 4 MG/2ML IJ SOLN: 4.0000 mg | Freq: Four times a day (QID) | INTRAMUSCULAR | Status: DC | PRN

## 2016-11-24 MED ORDER — INSULIN ASPART 100 UNIT/ML ~~LOC~~ SOLN
0.0000 [IU] | Freq: Three times a day (TID) | SUBCUTANEOUS | Status: DC
Start: 2016-11-24 — End: 2016-11-25

## 2016-11-24 MED ORDER — OXYCODONE-ACETAMINOPHEN 5-325 MG PO TABS
1.0000 | ORAL_TABLET | Freq: Four times a day (QID) | ORAL | Status: DC | PRN
  Administered 2016-11-24 – 2016-11-25 (×3): 2 via ORAL
  Filled 2016-11-24 (×3): qty 2

## 2016-11-24 MED ORDER — LAMOTRIGINE 100 MG PO TABS
200.0000 mg | ORAL_TABLET | Freq: Every day | ORAL | Status: DC
  Administered 2016-11-25: 200 mg via ORAL
  Filled 2016-11-24: qty 2

## 2016-11-24 MED ORDER — ALBUTEROL SULFATE (2.5 MG/3ML) 0.083% IN NEBU: 2.5000 mg | INHALATION_SOLUTION | RESPIRATORY_TRACT | Status: DC | PRN

## 2016-11-24 MED ORDER — LIDOCAINE 5 % EX PTCH
1.0000 | MEDICATED_PATCH | CUTANEOUS | Status: DC
  Administered 2016-11-24 – 2016-11-25 (×2): 1 via TRANSDERMAL
  Filled 2016-11-24 (×2): qty 1

## 2016-11-24 MED ORDER — PIPERACILLIN-TAZOBACTAM 3.375 G IVPB 30 MIN
3.3750 g | Freq: Once | INTRAVENOUS | Status: AC
  Administered 2016-11-24: 3.375 g via INTRAVENOUS
  Filled 2016-11-24: qty 50

## 2016-11-24 MED ORDER — INSULIN ASPART 100 UNIT/ML ~~LOC~~ SOLN: 0.0000 [IU] | Freq: Every day | SUBCUTANEOUS | Status: DC

## 2016-11-24 MED ORDER — TRAZODONE HCL 50 MG PO TABS
300.0000 mg | ORAL_TABLET | Freq: Every day | ORAL | Status: DC
  Administered 2016-11-24: 300 mg via ORAL
  Filled 2016-11-24: qty 6

## 2016-11-24 MED ORDER — IOPAMIDOL (ISOVUE-300) INJECTION 61%
100.0000 mL | Freq: Once | INTRAVENOUS | Status: AC | PRN
  Administered 2016-11-24: 100 mL via INTRAVENOUS

## 2016-11-24 MED ORDER — ESCITALOPRAM OXALATE 20 MG PO TABS
20.0000 mg | ORAL_TABLET | Freq: Every morning | ORAL | Status: DC
  Administered 2016-11-25: 20 mg via ORAL
  Filled 2016-11-24: qty 1

## 2016-11-24 MED ORDER — SODIUM CHLORIDE 0.9 % IV BOLUS (SEPSIS)
1000.0000 mL | Freq: Once | INTRAVENOUS | Status: AC
  Administered 2016-11-24: 1000 mL via INTRAVENOUS

## 2016-11-24 MED ORDER — FENTANYL CITRATE (PF) 100 MCG/2ML IJ SOLN
50.0000 ug | INTRAMUSCULAR | Status: AC | PRN
  Administered 2016-11-24 (×3): 50 ug via INTRAVENOUS
  Filled 2016-11-24 (×3): qty 2

## 2016-11-24 MED ORDER — SODIUM CHLORIDE 0.9% FLUSH
3.0000 mL | Freq: Two times a day (BID) | INTRAVENOUS | Status: DC
  Administered 2016-11-24: 3 mL via INTRAVENOUS

## 2016-11-24 MED ORDER — ADULT MULTIVITAMIN W/MINERALS CH
1.0000 | ORAL_TABLET | Freq: Every day | ORAL | Status: DC
  Administered 2016-11-25: 1 via ORAL
  Filled 2016-11-24: qty 1

## 2016-11-24 MED ORDER — SODIUM CHLORIDE 0.9 % IV BOLUS (SEPSIS)
250.0000 mL | Freq: Once | INTRAVENOUS | Status: AC
  Administered 2016-11-24: 250 mL via INTRAVENOUS

## 2016-11-24 MED ORDER — ONDANSETRON HCL 4 MG PO TABS: 4.0000 mg | ORAL_TABLET | Freq: Four times a day (QID) | ORAL | Status: DC | PRN

## 2016-11-24 MED ORDER — POTASSIUM CHLORIDE CRYS ER 20 MEQ PO TBCR
40.0000 meq | EXTENDED_RELEASE_TABLET | Freq: Once | ORAL | Status: AC
  Administered 2016-11-24: 40 meq via ORAL
  Filled 2016-11-24: qty 2

## 2016-11-24 MED ORDER — POTASSIUM CHLORIDE IN NACL 40-0.9 MEQ/L-% IV SOLN
INTRAVENOUS | Status: AC
  Administered 2016-11-24 – 2016-11-25 (×3): 125 mL/h via INTRAVENOUS
  Filled 2016-11-24 (×4): qty 1000

## 2016-11-24 MED ORDER — LAMOTRIGINE 100 MG PO TABS
100.0000 mg | ORAL_TABLET | Freq: Every day | ORAL | Status: DC
  Administered 2016-11-24: 100 mg via ORAL
  Filled 2016-11-24: qty 1

## 2016-11-24 NOTE — ED Triage Notes (Addendum)
Pt c/o generalized abdominal pain with dry heaves and diarrhea, hx of gastric bypass, pancreatitis, perforated ulcers, and bowel obstruction. Also reports back pain, s/p lumbar fusion and subsequent hardware removal, sts pain radiates down the leg with intermittent weakness

## 2016-11-24 NOTE — Progress Notes (Signed)
Pharmacy Antibiotic Note  Hector [name] is a 56 y.o. male admitted on 11/24/2016 with sepsis.  Patient presented with abdominal pain, diarrhea and dry heaves.  Pharmacy has been consulted for Vancomycin and Zosyn dosing.  Vancomycin 1gm and Zosyn 3.375gm IV x 1 dose have been ordered in the ED.  Plan:  Zosyn 3.375gm IV q8h (each dose infused over 4 hrs)  Vancomycin 1gm IV q12h  Vancomycin trough goal: 15-20 mcg/ml  Follow daily SCr  F/U renal function, cultures  Height: 5\' 10"  (177.8 cm) Weight: 163 lb (73.9 kg) IBW/kg (Calculated) : 73  Temp (24hrs), Avg:98.5 F (36.9 C), Min:98.2 F (36.8 C), Max:98.8 F (37.1 C)   Recent Labs Lab 11/24/16 0431 11/24/16 0521  WBC 3.5*  --   CREATININE 1.30*  --   LATICACIDVEN  --  4.36*    Estimated Creatinine Clearance: 66.3 mL/min (by C-G formula based on SCr of 1.3 mg/dL (H)).    Allergies  Allergen Reactions  . Aspirin Other (See Comments)    perforated ulcer  . Cymbalta [Duloxetine Hcl] Other (See Comments)    Over-sedating; slept over 24 hours with one dose.  Possible serotonin syndrome.  . Tape Rash    Surgical tape  . Nsaids Other (See Comments)    ulcer  . Hydrocodone Other (See Comments)    headache    Antimicrobials this admission: 3/12 vanc >>   3/12 zosyn >>    Dose adjustments this admission:    Microbiology results: 3/12 BCx: sent 3/12 UCx: sent   Thank you for allowing pharmacy to be a part of this patient's care.  Everette Rank, PharmD 11/24/2016 5:38 AM

## 2016-11-24 NOTE — ED Provider Notes (Signed)
Patient care transferred from [facility], pending CT results prior to admit consult. Code sepsis was previously called and patient had been given IV fluids, zosyn, vanc per pharmacy consult.Patient was febrile, tachycardic and lactate of 4.36. On my assessment, he was stable and pain had improved after fentanyl. Stable vital signs and non-toxic appearing. Spoke to admitting physician and patient will be admitted to hospital service.   Emeline General, PA-C 11/24/16 [place], MD 11/25/16 [phone]

## 2016-11-24 NOTE — ED Notes (Addendum)
RN AND MD NOTIFIED OF PATIENT'S LACTIC ACID LEVEL OF 4.36

## 2016-11-24 NOTE — ED Notes (Addendum)
First set of blood cultures obtained from right forearm at 0530

## 2016-11-24 NOTE — ED Provider Notes (Signed)
[facility] DEPT Provider Note   CSN: [id number] Arrival date & time: 11/24/16  0415     History   Chief Complaint Chief Complaint  Patient presents with  . Abdominal Pain  . Back Pain    HPI Hector [name] is a 56 y.o. male with a hx of Anxiety, angina, chest pain, diabetes type 2 resolved after gastric bypass in 2011, gastric ulcer perforation, GERD, headache, obesity, small bowel obstruction with apnea, cholecystectomy presents to the Emergency Department complaining of gradual, persistent, progressively worsening generalized abdominal pain with associated dry heaves and diarrhea. Patient reports the symptoms began 3 days ago.  He reports he is tolerating fluids without difficulty.  Pain is throbbing in nature.  No aggravating or alleviating factors.    She has also complaining of low back pain. His history of lumbar fusion and subsequent hardware removal Aug 2017.  He's been packing to move and moving heavy boxes.  Patient reports that the pain is radiating down the bilateral legs, but no current numbness or tingling.  He'd had tingling in the past.  He denies bowel or bladder dysfunction. He states he's had intermittent weakness for many months.  No falls or known trauma.  He's followed by Dr. Ellene Route with f/u on 3/26.  Pt denies IVDU, HIV, immunocompromise.     The history is provided by the patient and medical records. No language interpreter was used.    Past Medical History:  Diagnosis Date  . Abnormal LFTs    improved after gastic bypass  . ADJ DISORDER WITH MIXED ANXIETY & DEPRESSED MOOD 08/23/2009   Qualifier: Diagnosis of  By: [name] MD, Standley Brooking Now on lamictal and lexapro and elevil for sleep.   . Anginal pain ([place])   . Anxiety   . B12 nutritional deficiency   . Chest pain    hopsitalized 2 14 evaluation  . Closed head injury    laceration  9 12  no loc  vision change  . DDD (degenerative disc disease)   . Diabetes mellitus    resolved with gastric bypass  .  DIABETES MELLITUS, TYPE II 04/06/2007   Qualifier: Diagnosis of  By: [name] MD, Standley Brooking Improved and off meds since gastric bypass   . GERD (gastroesophageal reflux disease)    no meds  . Gout   . Headache    once per week uses Indomethicin hx of cluster headaches   . History of bunionectomy of left great toe   . History of concussion   . History of renal stone    Seen in emergency room not urologist  . Hyperlipidemia   . OBESITY 12/10/2007   Qualifier: Diagnosis of  By: [name] MD, Standley Brooking Gastric bypass   . OBSTRUCTIVE SLEEP APNEA 07/22/2007   Qualifier: Diagnosis of  By: [name] RN, Doroteo Bradford    . OSA (obstructive sleep apnea) 12/12/2013  . Pancreatitis 03/21/2016   went to [place]  . Perforated ulcer ([place])    Surgical repair9/24 2011  . Sleep apnea    had gastric bypass/ sa-gone  . 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 Pancreatitis 03/19/2016  . Renal calculi   . 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 Type II or unspecified type diabetes mellitus without mention of complication, not stated as uncontrolled 05/15/2014  . Lumbar back pain with radiculopathy affecting left lower extremity 05/15/2014  . Medication management 05/15/2014  .  Pre-op evaluation 05/15/2014  . Insufficient treatment with nasal CPAP 04/24/2014  . 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 Hypoxemia 04/24/2014  . OSA (obstructive sleep apnea) 12/12/2013  . 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 History of renal stone   . Colon cancer screening 12/28/2012  . GERD (gastroesophageal reflux disease) 12/28/2012  . Visit for preventive health examination 09/06/2012  . History of gout 09/06/2012  . Easy bruising 03/24/2012  . Headaches due to old head injury 03/24/2012  . Insomnia 03/24/2012  . BACK PAIN WITH RADICULOPATHY 10/07/2010  . THROMBOCYTOPENIA 05/24/2010  . ADJ DISORDER WITH MIXED  ANXIETY & DEPRESSED MOOD 08/23/2009  . BACK PAIN 10/20/2008  . UNS ADVRS EFF OTH RX MEDICINAL&BIOLOGICAL SBSTNC 02/04/2008  . GOUT 08/02/2007  . ASTHMA 07/22/2007  . B12 deficiency 04/06/2007  . CARPAL TUNNEL SYNDROME 04/06/2007    Past Surgical History:  Procedure Laterality Date  . BACK SURGERY  2012   Dr. Dorina Hoyer disk  . CARPAL TUNNEL RELEASE  2008   left-ulnar nerve decomp  . carpel tunnel Left   . CHOLECYSTECTOMY N/A 03/21/2016   Procedure: LAPAROSCOPIC CHOLECYSTECTOMY WITH INTRAOPERATIVE CHOLANGIOGRAM;  Surgeon: Ralene Ok, MD;  Location: [facility];  Service: General;  Laterality: N/A;  . colonscopy     . EPIDURAL BLOCK INJECTION     by Dr. Nelva Bush  . FOOT SURGERY     bilat for removal of extra bone   . GASTRIC BYPASS  11/06/09   beginning weight 267  . HARDWARE REMOVAL N/A 05/02/2016   Procedure: Removal of hardware Lumbar five-Sacral one with Metrex;  Surgeon: Kristeen Miss, MD;  Location: [facility];  Service: Neurosurgery;  Laterality: N/A;  Removal of hardware L5-S1 with Metrex  . HERNIA REPAIR  7510   umbilical  . HIATAL HERNIA REPAIR N/A 01/20/2013   Procedure: LAPAROSCOPIC REPAIR OF INTERNAL HERNIA;  Surgeon: Shann Medal, MD;  Location: [facility];  Service: General;  Laterality: N/A;  . LUMBAR LAMINECTOMY/DECOMPRESSION MICRODISCECTOMY Left 06/07/2014   Procedure: MICRO LUMBAR DECOMPRESSION L5 - S1 ON THE LEFT/L5 FORAMINOTOMY/EXCISION SYNOVIAL CYST  1 LEVEL;  Surgeon: [name], MD;  Location: [facility];  Service: Orthopedics;  Laterality: Left;  [name] MASS EXCISION Right 04/18/2013   Procedure: RIGHT SMALL FINGER SKIN LESION EXCISION;  Surgeon: Jolyn Nap, MD;  Location: [facility];  Service: Orthopedics;  Laterality: Right;  . NASAL SEPTOPLASTY W/ TURBINOPLASTY     x 2  . NEPHROLITHOTOMY Right 11/03/2014   Procedure: RIGHT PERCUTANEOUS NEPHROLITHOTOMY;  Surgeon: Claybon Jabs, MD;  Location: [facility];  Service: Urology;  Laterality: Right;  .  SHOULDER ARTHROSCOPY  2014   rt  . surgerical repair of stomach ulcer  06/08/10   per  . TONSILLECTOMY    . UVULOPALATOPHARYNGOPLASTY  2007   with tonsils  . VASECTOMY         Home Medications    Prior to Admission medications   Medication Sig Start Date End Date Taking? Authorizing Provider  acetaminophen (TYLENOL ARTHRITIS PAIN) 650 MG CR tablet Take 650 mg by mouth every 8 (eight) hours as needed for pain.    Historical Provider, MD  amitriptyline (ELAVIL) 50 MG tablet TAKE 1 TABLET AS NEEDED FOR SLEEP 11/04/16   Burnis Medin, MD  Cholecalciferol (VITAMIN D-1000 MAX ST) 1000 units tablet Take 1,000 Units by mouth daily.    Historical Provider, MD  cyclobenzaprine (FLEXERIL) 10 MG tablet Take 1 tablet by mouth 2 (two)  times daily as needed. 04/03/16   Historical Provider, MD  docusate sodium (COLACE) 100 MG capsule Take 1 capsule (100 mg total) by mouth 2 (two) times daily. 03/22/16   Clanford Marisa Hua, MD  escitalopram (LEXAPRO) 20 MG tablet Take 20 mg by mouth every morning.     Historical Provider, MD  fluocinonide-emollient (LIDEX-E) 0.05 % cream Apply 1 application topically 2 (two) times daily. To [place] not on face. 04/04/16   Burnis Medin, MD  indomethacin (INDOCIN) 50 MG capsule TAKE 1 CAPSULE BY MOUTH 3 TIMES A DAY AS NEEDED 10/23/16   Burnis Medin, MD  lamoTRIgine (LAMICTAL) 100 MG tablet Take 100-200 mg by mouth 2 (two) times daily. Take 200 mg in the am and 100 mg qhs.    Historical Provider, MD  Multiple Vitamin (MULTIVITAMIN) tablet Take 1 tablet by mouth daily.    Historical Provider, MD  NONFORMULARY OR COMPOUNDED ITEM Apply 1-2 g topically 4 (four) times daily. 07/10/16   Edrick Kins, DPM  ondansetron (ZOFRAN) 4 MG tablet Take 4 mg by mouth every 8 (eight) hours as needed for nausea or vomiting.    Historical Provider, MD  oxyCODONE-acetaminophen (PERCOCET) 10-325 MG tablet Take 1 tablet by mouth every 6 (six) hours as needed for pain. 09/09/16   Wallene Huh,  DPM  oxyCODONE-acetaminophen (ROXICET) 5-325 MG tablet Take 1-2 tablets by mouth every 4 (four) hours as needed for severe pain. 05/02/16   Kristeen Miss, MD  traZODone (DESYREL) 100 MG tablet Take 300 mg by mouth at bedtime.     Historical Provider, MD  vitamin B-12 (CYANOCOBALAMIN) 1000 MCG tablet Take 1,000 mcg by mouth daily.    Historical Provider, MD    Family History Family History  Problem Relation Age of Onset  . Uterine cancer Mother   . Hypertension Mother   . Cervical cancer Mother   . Heart disease Father   . COPD Father   . Colon cancer Maternal Uncle   . Colon cancer Paternal Aunt     Social History Social History  Substance Use Topics  . Smoking status: Never Smoker  . Smokeless tobacco: Never Used  . Alcohol use Yes     Comment: occas liquor      Allergies   Aspirin; Cymbalta [duloxetine hcl]; Tape; Nsaids; and Hydrocodone   Review of Systems Review of Systems  Constitutional: Positive for chills, fatigue and fever.  Gastrointestinal: Positive for abdominal pain, diarrhea and nausea. Negative for vomiting.  Musculoskeletal: Positive for back pain.  All other systems reviewed and are negative.    Physical Exam Updated Vital Signs BP (!) 128/112   Pulse 112   Temp 98.2 F (36.8 C) (Oral)   Resp 20   SpO2 98%   Physical Exam  Constitutional: He appears well-developed and well-nourished. No distress.  Awake, alert, nontoxic appearance  HENT:  Head: Normocephalic and atraumatic.  Mouth/Throat: Oropharynx is clear and moist. No oropharyngeal exudate.  Eyes: Conjunctivae are normal. No scleral icterus.  Neck: Normal range of motion. Neck supple.  Full ROM without pain  Cardiovascular: Regular rhythm and intact distal pulses.  Tachycardia present.   Pulses:      Radial pulses are 2+ on the right side, and 2+ on the left side.       Dorsalis pedis pulses are 2+ on the right side, and 2+ on the left side.  Pulmonary/Chest: Effort normal and breath  sounds normal. No respiratory distress. He has no wheezes.  Equal chest expansion  Abdominal: Soft. Bowel sounds are normal. He exhibits no distension and no mass. There is no tenderness. There is no rebound and no guarding.  Genitourinary:  Genitourinary Comments: No TTP of the perineum No tenderness to the rectum No hemorrhoid or perirectal abscess RN chaperone present during exam  Musculoskeletal: Normal range of motion. He exhibits no edema.  Full range of motion of the T-spine and L-spine Midline and paraspinal tenderness to the L spine.  Well healed surgical incisions to the site.  No midline or paraspinal tenderness to the T-spine  Lymphadenopathy:    He has no cervical adenopathy.  Neurological: He is alert.  Speech is clear and goal oriented, follows commands Normal 5/5 strength in upper and lower extremities bilaterally including dorsiflexion and plantar flexion, strong and equal grip strength Sensation normal to light and sharp touch Moves extremities without ataxia, coordination intact No Clonus  Skin: Skin is warm and dry. No rash noted. He is not diaphoretic. No erythema.  No rash  Psychiatric: He has a normal mood and affect. His behavior is normal.  Nursing note and vitals reviewed.    ED Treatments / Results  Labs (all labs ordered are listed, but only abnormal results are displayed) Labs Reviewed  COMPREHENSIVE METABOLIC PANEL - Abnormal; Notable for the following:       Result Value   Potassium 3.3 (*)    Glucose, Bld 243 (*)    Creatinine, Ser 1.30 (*)    Calcium 8.7 (*)    Total Protein 6.4 (*)    ALT 9 (*)    All other components within normal limits  CBC - Abnormal; Notable for the following:    WBC 3.5 (*)    RBC 4.15 (*)    HCT 38.8 (*)    Platelets 139 (*)    All other components within normal limits  I-STAT CG4 LACTIC ACID, ED - Abnormal; Notable for the following:    Lactic Acid, Venous 4.36 (*)    All other components within normal limits    CULTURE, BLOOD (ROUTINE X 2)  CULTURE, BLOOD (ROUTINE X 2)  URINE CULTURE  LIPASE, BLOOD  URINALYSIS, ROUTINE W REFLEX MICROSCOPIC    EKG  EKG Interpretation  Date/Time:  Monday November 24 2016 05:37:51 EDT Ventricular Rate:  96 PR Interval:    QRS Duration: 88 QT Interval:  342 QTC Calculation: 433 R Axis:   1 Text Interpretation:  Sinus rhythm U waves present No significant change since last tracing 09 May 2015 Confirmed by KNAPP  MD-I, IVA ([id number]) on 11/24/2016 5:43:54 AM       Radiology Dg Abdomen Acute W/chest  Result Date: 11/24/2016 CLINICAL DATA:  Worsening low back pain and low abdominal pain over the last 2 weeks. Cough, nausea. Nonsmoker. History of small bowel obstructions. EXAM: DG ABDOMEN ACUTE W/ 1V CHEST COMPARISON:  Lumbar spine 07/31/2016.  Abdomen 07/23/2016. FINDINGS: Normal heart size and pulmonary vascularity. No focal airspace disease or consolidation in the lungs. No blunting of costophrenic angles. No pneumothorax. Mediastinal contours appear intact. Postoperative changes in the abdomen. Normal heart size and pulmonary vascularity. No focal airspace disease or consolidation in the lungs. No blunting of costophrenic angles. No pneumothorax. Mediastinal contours appear intact. Scattered gas and stool in the colon. No small or large bowel distention. No free intra-abdominal air. No abnormal air-fluid levels. No radiopaque stones. Widening of the symphysis pubis unchanged since previous studies. IMPRESSION: No evidence of active pulmonary disease. Nonobstructive bowel gas  pattern. Electronically Signed   By: Lucienne Capers M.D.   On: 11/24/2016 05:16    Procedures Procedures (including critical care time)  CRITICAL CARE Performed by: Abigail Butts Total critical care time: 45 minutes Critical care time was exclusive of separately billable procedures and treating other patients. Critical care was necessary to treat or prevent imminent or life-threatening  deterioration. Critical care was time spent personally by me on the following activities: development of treatment plan with patient and/or surrogate as well as nursing, discussions with consultants, evaluation of patient's response to treatment, examination of patient, obtaining history from patient or surrogate, ordering and performing treatments and interventions, ordering and review of laboratory studies, ordering and review of radiographic studies, pulse oximetry and re-evaluation of patient's condition.   Medications Ordered in ED Medications  vancomycin (VANCOCIN) IVPB 1000 mg/200 mL premix (not administered)  piperacillin-tazobactam (ZOSYN) IVPB 3.375 g (not administered)  fentaNYL (SUBLIMAZE) injection 50 mcg (50 mcg Intravenous Given 11/24/16 0601)  diphenhydrAMINE (BENADRYL) injection 25 mg (not administered)  sodium chloride 0.9 % bolus 500 mL (0 mLs Intravenous Stopped 11/24/16 0604)  sodium chloride 0.9 % bolus 1,000 mL (0 mLs Intravenous Stopped 11/24/16 0616)    And  sodium chloride 0.9 % bolus 1,000 mL (1,000 mLs Intravenous New Bag/Given 11/24/16 0554)    And  sodium chloride 0.9 % bolus 250 mL (250 mLs Intravenous New Bag/Given 11/24/16 0616)  piperacillin-tazobactam (ZOSYN) IVPB 3.375 g (0 g Intravenous Stopped 11/24/16 0614)  vancomycin (VANCOCIN) IVPB 1000 mg/200 mL premix (0 mg Intravenous Stopped 11/24/16 0640)     Initial Impression / Assessment and Plan / ED Course  I have reviewed the triage vital signs and the nursing notes.  Pertinent labs & imaging results that were available during my care of the patient were reviewed by me and considered in my medical decision making (see chart for details).  Clinical Course as of Nov 25 639  Mon Nov 24, 2016  0557 Lactic Acid, Venous: (!!) 4.36 [HM]    Clinical Course User Index [HM] Abigail Butts, PA-C    Patient presents with abdominal pain and back pain. Patient meets sepsis criteria on arrival with lactic greater  than 4. UA without evidence of UTI. Mild hypokalemia at 3.3. Elevated serum creatinine 1.3 not far above baseline.  Leukopenia at 3.5. No anemia. Acute abdominal series without free air or evidence of large obstruction. No evidence of pneumonia on x-ray. Lung sounds clear and equal.  Patient with acute on chronic back pain. No neurologic deficits.  Patient with removal of hardware in August 2017.  The drug use. Doubt epidural abscess.  At shift change care was transferred to [facility], PA-C.  She will follow CT abd, reassess and admit patient.    The patient was discussed with and seen by Dr. Rolland Porter who agrees with the treatment plan.  Sepsis - Repeat Assessment  Performed at:    6:38 AM   Vitals     Blood pressure 138/78, pulse 93, temperature 101.8 F (38.8 C), temperature source Rectal, resp. rate 24, height 5\' 10"  (1.778 m), weight 73.9 kg, SpO2 94 %.  Heart:     Regular rate and rhythm  Lungs:    CTA  Capillary Refill:   <2 sec  Peripheral Pulse:   Radial pulse palpable  Skin:     Normal Color   6:40 AM She now with rash to the left arm developed during vancomycin infusion. Antibiotics stopped. Benadryl given. Patient clear and equal  breath sounds. No feeling of throat swelling, or vomiting. No distress.  Final Clinical Impressions(s) / ED Diagnoses   Final diagnoses:  Chronic bilateral low back pain with bilateral sciatica  Generalized abdominal pain  Sepsis, due to unspecified organism [facility]    New Prescriptions New Prescriptions   No medications on file     Abigail Butts, PA-C 11/24/16 Carrizo Hill, MD 11/24/16 [phone]

## 2016-11-24 NOTE — Consult Note (Signed)
Consultation  Referring Provider: Dr. Algis Liming      Primary Care Physician:  Lottie Dawson, MD Primary Gastroenterologist:   Dr. Deatra Ina in past now Dr. Carlean Purl Reason for Consultation:  Abnormal CT Abdomen, Diarrhea, Nausea           HPI:   Hector [name] is a 56 y.o. Caucasian male with a past medical history of DM2 that resolved after gastric bypass, GERD, HLD, OSA on CPAP, perforated ulcer, cholecystectomy, gastric bypass, hiatal hernia repair, multiple low back surgereis and chronic low back pain, who presented to the ED today with a complaint of worsening lower back pain, abdominal pain and diarrhea.   Today, the patient describes that about a week and a half ago he had a decrease in appetite and developed some abdominal pain mainly "above the level of my belly button", he then started with loose stools, 3-4x a day, about a week ago. Patient tells me that over the past 3 days these have become nothing but water. He has continued with this abdominal pain which started out of the blue and is more of a "throb" rated as a 2/10. This is not made better or worse by anything. Associated symptoms include two days of dry heaves over the weekend, but no further nausea or vomiting since.   Patient also mainly complains of a worsening lower back pain which started getting worse about 4 weeks ago when he started packing boxes to move houses.    Patient reminds me of his multiple intraabdominal surgeries including gastric bypass, perforated ulcer, sb obstruction and cholecystectomy.   Patient also tells me that about 2-3 weeks ago he noticed a "bump" on the inside of his rectum which seemed to go down and then "come back". He denies any discharge or rectal bleeding.   Patient denies objective fever, worsening abdominal pain, continue nausea or vomiting.   ED Course: Spiked a fever of 101.8, was tachycardic in the 100s, leukopenia, thrombocytopenia, elevated lactate, chest x-ray without acute  findings and CT abdomen shows findings suspicious for enteritis and proctitis. He was treated for sepsis protocol with IV fluids and initiated on IV vancomycin and Zosyn. He developed left cubital foci of rash suspected secondary to vancomycin which was discontinued. Rash resolved after a dose of Benadryl. Hospitalist admission was requested.  GI History: [place]-05/08/14-Dr. Kaplan-mild tics in descending colon EGD-09/30/10-Dr. Georganna Skeans  Past Medical History:  Diagnosis Date  . Abnormal LFTs    improved after gastic bypass  . ADJ DISORDER WITH MIXED ANXIETY & DEPRESSED MOOD 08/23/2009   Qualifier: Diagnosis of  By: [name] MD, Standley Brooking Now on lamictal and lexapro and elevil for sleep.   . Anginal pain ([place])   . Anxiety   . B12 nutritional deficiency   . Chest pain    hopsitalized 2 14 evaluation  . Closed head injury    laceration  9 12  no loc  vision change  . DDD (degenerative disc disease)   . Diabetes mellitus    resolved with gastric bypass  . DIABETES MELLITUS, TYPE II 04/06/2007   Qualifier: Diagnosis of  By: [name] MD, Standley Brooking Improved and off meds since gastric bypass   . GERD (gastroesophageal reflux disease)    no meds  . Gout   . Headache    once per week uses Indomethicin hx of cluster headaches   . History of bunionectomy of left great toe   . History of concussion   . History of  renal stone    Seen in emergency room not urologist  . Hyperlipidemia   . OBESITY 12/10/2007   Qualifier: Diagnosis of  By: [name] MD, Standley Brooking Gastric bypass   . OBSTRUCTIVE SLEEP APNEA 07/22/2007   Qualifier: Diagnosis of  By: [name] RN, Doroteo Bradford    . OSA (obstructive sleep apnea) 12/12/2013  . Pancreatitis 03/21/2016   went to [place]  . Perforated ulcer ([place])    Surgical repair9/24 2011  . Sleep apnea    had gastric bypass/ sa-gone  . 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 BACK SURGERY  2012   Dr. Dorina Hoyer disk  .  CARPAL TUNNEL RELEASE  2008   left-ulnar nerve decomp  . carpel tunnel Left   . CHOLECYSTECTOMY N/A 03/21/2016   Procedure: LAPAROSCOPIC CHOLECYSTECTOMY WITH INTRAOPERATIVE CHOLANGIOGRAM;  Surgeon: Ralene Ok, MD;  Location: [facility];  Service: General;  Laterality: N/A;  . colonscopy     . EPIDURAL BLOCK INJECTION     by Dr. Nelva Bush  . FOOT SURGERY     bilat for removal of extra bone   . GASTRIC BYPASS  11/06/09   beginning weight 267  . HARDWARE REMOVAL N/A 05/02/2016   Procedure: Removal of hardware Lumbar five-Sacral one with Metrex;  Surgeon: Kristeen Miss, MD;  Location: [facility];  Service: Neurosurgery;  Laterality: N/A;  Removal of hardware L5-S1 with Metrex  . HERNIA REPAIR  0932   umbilical  . HIATAL HERNIA REPAIR N/A 01/20/2013   Procedure: LAPAROSCOPIC REPAIR OF INTERNAL HERNIA;  Surgeon: Shann Medal, MD;  Location: [facility];  Service: General;  Laterality: N/A;  . LUMBAR LAMINECTOMY/DECOMPRESSION MICRODISCECTOMY Left 06/07/2014   Procedure: MICRO LUMBAR DECOMPRESSION L5 - S1 ON THE LEFT/L5 FORAMINOTOMY/EXCISION SYNOVIAL CYST  1 LEVEL;  Surgeon: [name], MD;  Location: [facility];  Service: Orthopedics;  Laterality: Left;  [name] MASS EXCISION Right 04/18/2013   Procedure: RIGHT SMALL FINGER SKIN LESION EXCISION;  Surgeon: Jolyn Nap, MD;  Location: [facility];  Service: Orthopedics;  Laterality: Right;  . NASAL SEPTOPLASTY W/ TURBINOPLASTY     x 2  . NEPHROLITHOTOMY Right 11/03/2014   Procedure: RIGHT PERCUTANEOUS NEPHROLITHOTOMY;  Surgeon: Claybon Jabs, MD;  Location: [facility];  Service: Urology;  Laterality: Right;  . SHOULDER ARTHROSCOPY  2014   rt  . surgerical repair of stomach ulcer  06/08/10   per  . TONSILLECTOMY    . UVULOPALATOPHARYNGOPLASTY  2007   with tonsils  . VASECTOMY      Family History  Problem Relation Age of Onset  . Uterine cancer Mother   . Hypertension Mother   . Cervical cancer Mother   . Heart disease Father   . COPD  Father   . Colon cancer Maternal Uncle   . Colon cancer Paternal Aunt     Social History  Substance Use Topics  . Smoking status: Never Smoker  . Smokeless tobacco: Never Used  . Alcohol use Yes     Comment: occas liquor     Prior to Admission medications   Medication Sig Start Date End Date Taking? Authorizing Provider  acetaminophen (TYLENOL ARTHRITIS PAIN) 650 MG CR tablet Take 650 mg by mouth every 8 (eight) hours as needed for pain.   Yes Historical Provider, MD  amitriptyline (ELAVIL) 50 MG tablet TAKE 1 TABLET AS NEEDED FOR SLEEP 11/04/16  Yes Burnis Medin, MD  Cholecalciferol (VITAMIN D-1000  MAX ST) 1000 units tablet Take 1,000 Units by mouth daily.   Yes Historical Provider, MD  cyclobenzaprine (FLEXERIL) 10 MG tablet Take 1 tablet by mouth 2 (two) times daily as needed for muscle spasms.  04/03/16  Yes Historical Provider, MD  escitalopram (LEXAPRO) 20 MG tablet Take 20 mg by mouth every morning.    Yes Historical Provider, MD  lamoTRIgine (LAMICTAL) 100 MG tablet Take 100-200 mg by mouth 2 (two) times daily. Take 200 mg in the am and 100 mg qhs.   Yes Historical Provider, MD  methocarbamol (ROBAXIN) 750 MG tablet Take 750 mg by mouth every 6 (six) hours as needed for muscle spasms.   Yes Historical Provider, MD  Multiple Vitamin (MULTIVITAMIN) tablet Take 1 tablet by mouth daily.   Yes Historical Provider, MD  ondansetron (ZOFRAN) 4 MG tablet Take 4 mg by mouth every 8 (eight) hours as needed for nausea or vomiting.   Yes Historical Provider, MD  traZODone (DESYREL) 100 MG tablet Take 300 mg by mouth at bedtime.    Yes Historical Provider, MD  vitamin B-12 (CYANOCOBALAMIN) 1000 MCG tablet Take 1,000 mcg by mouth daily.   Yes Historical Provider, MD    Current Facility-Administered Medications  Medication Dose Route Frequency Provider Last Rate Last Dose  . 0.9 % NaCl with KCl 40 mEq / L  infusion   Intravenous Continuous Modena Jansky, MD      . acetaminophen (TYLENOL)  tablet 650 mg  650 mg Oral Q6H PRN Modena Jansky, MD       Or  . acetaminophen (TYLENOL) suppository 650 mg  650 mg Rectal Q6H PRN Modena Jansky, MD      . albuterol (PROVENTIL) (2.5 MG/3ML) 0.083% nebulizer solution 2.5 mg  2.5 mg Nebulization Q2H PRN Modena Jansky, MD      . cholecalciferol (VITAMIN D) tablet 1,000 Units  1,000 Units Oral Daily Modena Jansky, MD      . cyclobenzaprine (FLEXERIL) tablet 10 mg  10 mg Oral BID PRN Modena Jansky, MD      . enoxaparin (LOVENOX) injection 40 mg  40 mg Subcutaneous Q24H Modena Jansky, MD      . Derrill Memo ON 11/25/2016] escitalopram (LEXAPRO) tablet 20 mg  20 mg Oral q morning - 10a Modena Jansky, MD      . insulin aspart (novoLOG) injection 0-5 Units  0-5 Units Subcutaneous QHS Modena Jansky, MD      . insulin aspart (novoLOG) injection 0-9 Units  0-9 Units Subcutaneous TID WC Modena Jansky, MD      . iopamidol (ISOVUE-300) 61 % injection           . lamoTRIgine (LAMICTAL) tablet 100 mg  100 mg Oral Q2000 Modena Jansky, MD      . Derrill Memo ON 11/25/2016] lamoTRIgine (LAMICTAL) tablet 200 mg  200 mg Oral Daily Modena Jansky, MD      . lidocaine (LIDODERM) 5 % 1 patch  1 patch Transdermal Q24H Modena Jansky, MD      . Derrill Memo ON 11/25/2016] multivitamin with minerals tablet 1 tablet  1 tablet Oral Daily Modena Jansky, MD      . ondansetron (ZOFRAN) tablet 4 mg  4 mg Oral Q6H PRN Modena Jansky, MD       Or  . ondansetron (ZOFRAN) injection 4 mg  4 mg Intravenous Q6H PRN Modena Jansky, MD      . oxyCODONE-acetaminophen (PERCOCET/ROXICET)  5-325 MG per tablet 1-2 tablet  1-2 tablet Oral Q6H PRN Modena Jansky, MD      . piperacillin-tazobactam (ZOSYN) IVPB 3.375 g  3.375 g Intravenous Q8H Leann T Poindexter, RPH      . potassium chloride SA (K-DUR,KLOR-CON) CR tablet 40 mEq  40 mEq Oral Once Modena Jansky, MD      . sodium chloride flush (NS) 0.9 % injection 3 mL  3 mL Intravenous Q12H Modena Jansky, MD      .  traZODone (DESYREL) tablet 300 mg  300 mg Oral Q2000 Modena Jansky, MD      . Derrill Memo ON 11/25/2016] vitamin B-12 (CYANOCOBALAMIN) tablet 1,000 mcg  1,000 mcg Oral Daily Modena Jansky, MD        Allergies as of 11/24/2016 - Review Complete 11/24/2016  Allergen Reaction Noted  . Aspirin Other (See Comments)   . Cymbalta [duloxetine hcl] Other (See Comments) 05/18/2015  . Tape Rash 03/19/2016  . Vancomycin Rash 11/24/2016  . Nsaids Other (See Comments) 03/19/2016  . Hydrocodone Other (See Comments) 09/27/2012     Review of Systems:    Constitutional: No weight loss, fever or chills Skin: No rash  Cardiovascular: No chest pain  Respiratory: No SOB  Gastrointestinal: See HPI and otherwise negative Genitourinary: No dysuria  Neurological: No headache Musculoskeletal: Positive for back pain Hematologic: No bleeding or bruising Psychiatric: No history of depression or anxiety   Physical Exam:  Vital signs in last 24 hours: Temp:  [98.2 F (36.8 C)-101.8 F (38.8 C)] 98.4 F (36.9 C) (03/12 1100) Pulse Rate:  [82-112] 82 (03/12 1100) Resp:  [14-26] 18 (03/12 1100) BP: (106-138)/(75-112) 138/83 (03/12 1100) SpO2:  [91 %-98 %] 97 % (03/12 1100) Weight:  [163 lb (73.9 kg)-164 lb 10.9 oz (74.7 kg)] 164 lb 10.9 oz (74.7 kg) (03/12 1100) Last BM Date: 11/24/16 General:   Pleasant Caucasian male appears to be in NAD, Well developed, Well nourished, alert and cooperative Head:  Normocephalic and atraumatic. Eyes:   PEERL, EOMI. No icterus. Conjunctiva pink. Ears:  Normal auditory acuity. Neck:  Supple Throat: Oral cavity and pharynx without inflammation, swelling or lesion. Teeth in good condition. Lungs: Respirations even and unlabored. Lungs clear to auscultation bilaterally.   No wheezes, crackles, or rhonchi.  Heart: Normal S1, S2. No MRG. Regular rate and rhythm. No peripheral edema, cyanosis or pallor.  Abdomen:  Soft, nondistended, mild epigastric tenderness. No rebound or  guarding. Normal bowel sounds. No appreciable masses or hepatomegaly. Rectal:  Not performed.  Msk:  Symmetrical without gross deformities.  Extremities:  Without edema, no deformity or joint abnormality. Neurologic:  Alert and  oriented x4;  grossly normal neurologically.  Skin:   Dry and intact without significant lesions or rashes. Psychiatric: Demonstrates good judgement and reason without abnormal affect or behaviors.   LAB RESULTS:  Recent Labs  11/24/16 0431  WBC 3.5*  HGB 13.5  HCT 38.8*  PLT 139*   BMET  Recent Labs  11/24/16 0431  NA 136  K 3.3*  CL 104  CO2 22  GLUCOSE 243*  BUN 13  CREATININE 1.30*  CALCIUM 8.7*   LFT  Recent Labs  11/24/16 0431  PROT 6.4*  ALBUMIN 3.7  AST 24  ALT 9*  ALKPHOS 64  BILITOT 0.5   Lab Results  Component Value Date   LIPASE 14 11/24/2016     STUDIES: Ct Abdomen Pelvis W Contrast  Result Date: 11/24/2016 CLINICAL DATA:  Periumbilical  pain with nausea for 1 week. Diarrhea. EXAM: CT ABDOMEN AND PELVIS WITH CONTRAST TECHNIQUE: Multidetector CT imaging of the abdomen and pelvis was performed using the standard protocol following bolus administration of intravenous contrast. CONTRAST:  151mL ISOVUE-300 IOPAMIDOL (ISOVUE-300) INJECTION 61% COMPARISON:  March 19, 2016 FINDINGS: Lower chest: There is patchy bibasilar lung atelectasis. There is slight lower lobe bronchiectatic change. Hepatobiliary: No focal liver lesions are evident. Slight periportal edema noted on the prior study remains present. Gallbladder is absent. There is no appreciable biliary duct dilatation. Pancreas: No pancreatic mass or inflammatory focus is evident. Spleen: Spleen measures 15.2 x 12.9 x 5.5 cm with a measured splenic volume of 539 cubic cm. No focal splenic lesions are appreciable. Adrenals/Urinary Tract: Adrenals appear unremarkable bilaterally. Parapelvic cysts are again noted with the largest parapelvic cyst on the left measuring 2.4 x 1.6 cm. No  hydronephrosis is evident on either side. There is no renal or ureteral calculus. No hydronephrosis on either side. Urinary bladder is midline with wall thickness within normal limits. Stomach/Bowel: There is borderline thickening of the wall of the rectum without perirectal stranding or perirectal abscess. There is somewhat asymmetric wall thickening along the leftward aspect of the rectal wall. Several loops of jejunum have borderline thickened walls. There is moderate fluid throughout much of the small bowel. There is no transition zone suggesting focal bowel obstruction. No free air or portal venous air is appreciable. The patient has had previous gastric bypass surgery without wall thickening or fluid in the areas of previous surgery. Vascular/Lymphatic: There is no abdominal aortic aneurysm. No vascular lesions are appreciable on this study. There are multiple mesenteric lymph nodes in the right abdomen, largest measuring 1.1 x 0.9 cm. No larger lymph nodes are noted elsewhere in the abdomen or pelvis. Reproductive: There are occasional prostatic calculi. The prostate and seminal vesicles appear normal in size and contour. Other: Appendix appears unremarkable. There is no appreciable ascites or abscess in the abdomen or pelvis. Musculoskeletal: There is degenerative change in the lumbar spine. No blastic or lytic bone lesions. No intramuscular abdominal wall lesions. IMPRESSION: There is mild wall thickening in several loops of jejunum with fluid throughout much of the small bowel. Suspect a degree of enteritis. Bowel obstruction is not evident. Mild thickening of the rectal wall is concerning for early proctitis. No abscess or perirectal stranding. Note that there is asymmetric wall thickening along the leftward aspect of the rectum. This finding may warrant proctoscopy to assess for possible developing lesion along the leftward aspect of the rectal wall. Status post gastric bypass surgery without  complicating feature in this area. A prominent lymph nodes in the right abdomen potentially could represent a degree of mesenteric enteritis or could be secondary to the apparent enteritis noted in the small bowel. Splenomegaly of uncertain etiology. Stable slight periportal edema of uncertain etiology. Question underlying parenchymal liver disease given this finding and the splenic enlargement. Electronically Signed   By: Lowella Grip III M.D.   On: 11/24/2016 07:46   Dg Abdomen Acute W/chest  Result Date: 11/24/2016 CLINICAL DATA:  Worsening low back pain and low abdominal pain over the last 2 weeks. Cough, nausea. Nonsmoker. History of small bowel obstructions. EXAM: DG ABDOMEN ACUTE W/ 1V CHEST COMPARISON:  Lumbar spine 07/31/2016.  Abdomen 07/23/2016. FINDINGS: Normal heart size and pulmonary vascularity. No focal airspace disease or consolidation in the lungs. No blunting of costophrenic angles. No pneumothorax. Mediastinal contours appear intact. Postoperative changes in the abdomen. Normal heart  size and pulmonary vascularity. No focal airspace disease or consolidation in the lungs. No blunting of costophrenic angles. No pneumothorax. Mediastinal contours appear intact. Scattered gas and stool in the colon. No small or large bowel distention. No free intra-abdominal air. No abnormal air-fluid levels. No radiopaque stones. Widening of the symphysis pubis unchanged since previous studies. IMPRESSION: No evidence of active pulmonary disease. Nonobstructive bowel gas pattern. Electronically Signed   By: Lucienne Capers M.D.   On: 11/24/2016 05:16   PREVIOUS ENDOSCOPIES:            See HPI   Impression / Plan:   Impression: 1. Acute Gastroenteritis: as seen on CT and 7d h/o diarrhea and abdominal pain, also ? Proctitis; Most concerning for infectious vs viral etiology with acute onset 2. Sepsis: likely secondary to above 3. Hypokalemia 4. Chronic Low Back pain:s/p multiple lumbar surgeries,  likely exacerbated by moving recently 5. Thrombocytopenia:chronic consider relation to possible liver dz?  Plan: 1. No change to current recommendations 2. Await CDiff and GI pathogen panel results 3. Please await further recommendations from Dr. Carlean Purl  Thank you for your kind consultation, we will continue to follow.  Lavone Nian [employer]  11/24/2016, 12:36 PM Pager #: [phone]    [facility] Attending   I have taken an interval history, reviewed the chart and examined the patient. I agree with the Advanced Practitioner's note, impression and recommendations.   Rectal exam done - tender mobile nodule in anal canal - suspect thrombosed hemrrrhoid - no blood  Do favor infectious gastroenteritis but internal hernia can also cause similar picture.  He seems to be improving.  Gatha Mayer, MD, [facility] [phone] (pager) [phone] after 5 PM, weekends and holidays  11/24/2016 2:21 PM

## 2016-11-24 NOTE — ED Notes (Signed)
Rash noted to left forearm now has resolved. Pt denies any itching or difficulty breathing.

## 2016-11-24 NOTE — ED Notes (Signed)
hospitalist at bedside

## 2016-11-24 NOTE — ED Notes (Signed)
Dr.Knapp at bedside to evaluate pt.

## 2016-11-24 NOTE — ED Notes (Addendum)
Vancomycin infusion stopped after appx 25ml infused due to rash beginning to start at IV site. Barbee Cough. PA at bedside to observe. Pt denies any itching or pain at site. Allergy list updated. No additional abx order given at this time by provider.

## 2016-11-24 NOTE — ED Notes (Signed)
Admitting RN at bedside.  

## 2016-11-24 NOTE — ED Notes (Signed)
Patient transported to CT 

## 2016-11-24 NOTE — H&P (Signed)
History and Physical    Hector [name] TUU:[id number] DOB: Oct 24, 1960 DOA: 11/24/2016  PCP: Lottie Dawson, MD   I have briefly reviewed patients previous medical reports in [facility].  Patient coming from: Home  Chief Complaint: Abdominal discomfort, diarrhea, worsening chronic low back pain  HPI: Hector [name] is a 56 year old male, married, independent of activities of daily living, works with [employer] production [employer], [place] of diet-controlled DM 2 that resolved after gastric bypass, GERD, HLD, OSA not on CPAP, perforated ulcer, cholecystectomy, gastric bypass, hiatal hernia repair, multiple low back surgeries and chronic low back pain (Neurosurgery/Dr. Ellene Route) presented to the [facility] emergency department on 11/24/16 with above complaints. He gives 5 days history of left lower abdominal discomfort, intermittent, rated at 5/10 in severity, nonradiating, associated with nausea, dry heaves, decreased appetite, chills and sweats intermittently and progressively worsening diarrhea. He used to have about 3-4 episodes of loose stools which has progressively worsened and had 4 episodes of watery stools on the night prior to admission. No blood or mucus noted. No history of sick contacts with similar complaints, eating anything unusual or recent exposure to antibiotics. Denies history of IBD. Up to date with screening colonoscopies in the last on 05/08/14 showed diverticulosis of descending colon but no acute findings. Patient had back surgeries and had to have hardware removed towards the later part of 2017 after which his back pain had almost resolved. However approximately 4 weeks ago, patient and spouse moved house and were packing and moving during which time he started experiencing gradual onset of low back pain which has progressively worsened, constant, intermittently worsened by certain positions and radiates to the lateral aspect of right thigh up to the foot, no tingling, numbness or weakness in lower  extremities and no sphincter problems reported. Even though he is not supposed to, he has been taking NSAIDs at time. He has a follow-up appointment with neurosurgery on 12/05/15.  ED Course: Spiked a fever of 101.8, was tachycardic in the 100s, leukopenia, thrombocytopenia, elevated lactate, chest x-ray without acute findings and CT abdomen shows findings suspicious for enteritis and proctitis. He was treated for sepsis protocol with IV fluids and initiated on IV vancomycin and Zosyn. He developed left cubital foci of rash suspected secondary to vancomycin which was discontinued. Rash resolved after a dose of Benadryl. Hospitalist admission was requested.  Review of Systems:  All other systems reviewed and apart from HPI, are negative.  Past Medical History:  Diagnosis Date  . Abnormal LFTs    improved after gastic bypass  . ADJ DISORDER WITH MIXED ANXIETY & DEPRESSED MOOD 08/23/2009   Qualifier: Diagnosis of  By: [name] MD, Standley Brooking Now on lamictal and lexapro and elevil for sleep.   . Anginal pain ([place])   . Anxiety   . B12 nutritional deficiency   . Chest pain    hopsitalized 2 14 evaluation  . Closed head injury    laceration  9 12  no loc  vision change  . DDD (degenerative disc disease)   . Diabetes mellitus    resolved with gastric bypass  . DIABETES MELLITUS, TYPE II 04/06/2007   Qualifier: Diagnosis of  By: [name] MD, Standley Brooking Improved and off meds since gastric bypass   . GERD (gastroesophageal reflux disease)    no meds  . Gout   . Headache    once per week uses Indomethicin hx of cluster headaches   . History of bunionectomy of left great toe   .  History of concussion   . History of renal stone    Seen in emergency room not urologist  . Hyperlipidemia   . OBESITY 12/10/2007   Qualifier: Diagnosis of  By: [name] MD, Standley Brooking Gastric bypass   . OBSTRUCTIVE SLEEP APNEA 07/22/2007   Qualifier: Diagnosis of  By: [name] RN, Doroteo Bradford    . OSA (obstructive sleep apnea) 12/12/2013  .  Pancreatitis 03/21/2016   went to [place]  . Perforated ulcer ([place])    Surgical repair9/24 2011  . Sleep apnea    had gastric bypass/ sa-gone  . 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 BACK SURGERY  2012   Dr. Dorina Hoyer disk  . CARPAL TUNNEL RELEASE  2008   left-ulnar nerve decomp  . carpel tunnel Left   . CHOLECYSTECTOMY N/A 03/21/2016   Procedure: LAPAROSCOPIC CHOLECYSTECTOMY WITH INTRAOPERATIVE CHOLANGIOGRAM;  Surgeon: Ralene Ok, MD;  Location: [facility];  Service: General;  Laterality: N/A;  . colonscopy     . EPIDURAL BLOCK INJECTION     by Dr. Nelva Bush  . FOOT SURGERY     bilat for removal of extra bone   . GASTRIC BYPASS  11/06/09   beginning weight 267  . HARDWARE REMOVAL N/A 05/02/2016   Procedure: Removal of hardware Lumbar five-Sacral one with Metrex;  Surgeon: Kristeen Miss, MD;  Location: [facility];  Service: Neurosurgery;  Laterality: N/A;  Removal of hardware L5-S1 with Metrex  . HERNIA REPAIR  1017   umbilical  . HIATAL HERNIA REPAIR N/A 01/20/2013   Procedure: LAPAROSCOPIC REPAIR OF INTERNAL HERNIA;  Surgeon: Shann Medal, MD;  Location: [facility];  Service: General;  Laterality: N/A;  . LUMBAR LAMINECTOMY/DECOMPRESSION MICRODISCECTOMY Left 06/07/2014   Procedure: MICRO LUMBAR DECOMPRESSION L5 - S1 ON THE LEFT/L5 FORAMINOTOMY/EXCISION SYNOVIAL CYST  1 LEVEL;  Surgeon: [name], MD;  Location: [facility];  Service: Orthopedics;  Laterality: Left;  [name] MASS EXCISION Right 04/18/2013   Procedure: RIGHT SMALL FINGER SKIN LESION EXCISION;  Surgeon: Jolyn Nap, MD;  Location: [place];  Service: Orthopedics;  Laterality: Right;  . NASAL SEPTOPLASTY W/ TURBINOPLASTY     x 2  . NEPHROLITHOTOMY Right 11/03/2014   Procedure: RIGHT PERCUTANEOUS NEPHROLITHOTOMY;  Surgeon: Claybon Jabs, MD;  Location: [facility];  Service: Urology;  Laterality: Right;  . SHOULDER ARTHROSCOPY  2014   rt  . surgerical  repair of stomach ulcer  06/08/10   per  . TONSILLECTOMY    . UVULOPALATOPHARYNGOPLASTY  2007   with tonsils  . VASECTOMY      Social History  reports that he has never smoked. He has never used smokeless tobacco. He reports that he drinks alcohol. He reports that he does not use drugs.  Allergies  Allergen Reactions  . Aspirin Other (See Comments)    perforated ulcer  . Cymbalta [Duloxetine Hcl] Other (See Comments)    Over-sedating; slept over 24 hours with one dose.  Possible serotonin syndrome.  . Tape Rash    Surgical tape  . Vancomycin Rash  . Nsaids Other (See Comments)    ulcer  . Hydrocodone Other (See Comments)    headache    Family History  Problem Relation Age of Onset  . Uterine cancer Mother   . Hypertension Mother   . Cervical cancer Mother   . Heart disease Father   . COPD Father   . Colon cancer Maternal Uncle   .  Colon cancer Paternal Aunt      Prior to Admission medications   Medication Sig Start Date End Date Taking? Authorizing Provider  acetaminophen (TYLENOL ARTHRITIS PAIN) 650 MG CR tablet Take 650 mg by mouth every 8 (eight) hours as needed for pain.    Historical Provider, MD  amitriptyline (ELAVIL) 50 MG tablet TAKE 1 TABLET AS NEEDED FOR SLEEP 11/04/16   Burnis Medin, MD  Cholecalciferol (VITAMIN D-1000 MAX ST) 1000 units tablet Take 1,000 Units by mouth daily.    Historical Provider, MD  cyclobenzaprine (FLEXERIL) 10 MG tablet Take 1 tablet by mouth 2 (two) times daily as needed. 04/03/16   Historical Provider, MD  docusate sodium (COLACE) 100 MG capsule Take 1 capsule (100 mg total) by mouth 2 (two) times daily. 03/22/16   Clanford Marisa Hua, MD  escitalopram (LEXAPRO) 20 MG tablet Take 20 mg by mouth every morning.     Historical Provider, MD  fluocinonide-emollient (LIDEX-E) 0.05 % cream Apply 1 application topically 2 (two) times daily. To [place] not on face. 04/04/16   Burnis Medin, MD  indomethacin (INDOCIN) 50 MG capsule TAKE 1  CAPSULE BY MOUTH 3 TIMES A DAY AS NEEDED 10/23/16   Burnis Medin, MD  lamoTRIgine (LAMICTAL) 100 MG tablet Take 100-200 mg by mouth 2 (two) times daily. Take 200 mg in the am and 100 mg qhs.    Historical Provider, MD  Multiple Vitamin (MULTIVITAMIN) tablet Take 1 tablet by mouth daily.    Historical Provider, MD  NONFORMULARY OR COMPOUNDED ITEM Apply 1-2 g topically 4 (four) times daily. 07/10/16   Edrick Kins, DPM  ondansetron (ZOFRAN) 4 MG tablet Take 4 mg by mouth every 8 (eight) hours as needed for nausea or vomiting.    Historical Provider, MD  oxyCODONE-acetaminophen (PERCOCET) 10-325 MG tablet Take 1 tablet by mouth every 6 (six) hours as needed for pain. 09/09/16   Wallene Huh, DPM  oxyCODONE-acetaminophen (ROXICET) 5-325 MG tablet Take 1-2 tablets by mouth every 4 (four) hours as needed for severe pain. 05/02/16   Kristeen Miss, MD  traZODone (DESYREL) 100 MG tablet Take 300 mg by mouth at bedtime.     Historical Provider, MD  vitamin B-12 (CYANOCOBALAMIN) 1000 MCG tablet Take 1,000 mcg by mouth daily.    Historical Provider, MD    Physical Exam: Vitals:   11/24/16 0930 11/24/16 1000 11/24/16 1030 11/24/16 1100  BP: (!) 132/105 106/83 132/93 138/83  Pulse: 82 87 85 82  Resp: _0 Temp:    98.4 F (36.9 C)  TempSrc:    Oral  SpO2: 92% 91% 95% 97%  Weight:    74.7 kg (164 lb 10.9 oz)  Height:    5' 10" (1.778 m)      Constitutional: Pleasant middle-aged male, moderately built and nourished, lying comfortably propped up in the gurney in the ED. Eyes: PERTLA, lids and conjunctivae normal ENMT: Mucous membranes are dry. Posterior pharynx clear of any exudate or lesions. Normal dentition.  Neck: supple, no masses, no thyromegaly Respiratory: clear to auscultation bilaterally, no wheezing, no crackles. Normal respiratory effort. No accessory muscle use.  Cardiovascular: S1 & S2 heard, regular rate and rhythm, no murmurs / rubs / gallops. No extremity edema. 2+ pedal  pulses. No carotid bruits.  Abdomen: No distension, no masses palpated. Mild tenderness in the left lower quadrant without peritoneal signs. No hepatosplenomegaly. Bowel sounds normal.  Musculoskeletal: no clubbing / cyanosis. No joint deformity  upper and lower extremities. Good ROM, no contractures. Normal muscle tone. Midline lumbar surgical scar. Skin: no rashes, lesions, ulcers. No induration Neurologic: CN 2-12 grossly intact. Sensation intact, DTR normal. Strength 5/5 in all 4 limbs.  Psychiatric: Normal judgment and insight. Alert and oriented x 3. Normal mood.     Labs on Admission: I have personally reviewed following labs and imaging studies  CBC:  Recent Labs Lab 11/24/16 0431  WBC 3.5*  HGB 13.5  HCT 38.8*  MCV 93.5  PLT 939*   Basic Metabolic Panel:  Recent Labs Lab 11/24/16 0431  NA 136  K 3.3*  CL 104  CO2 22  GLUCOSE 243*  BUN 13  CREATININE 1.30*  CALCIUM 8.7*   Liver Function Tests:  Recent Labs Lab 11/24/16 0431  AST 24  ALT 9*  ALKPHOS 64  BILITOT 0.5  PROT 6.4*  ALBUMIN 3.7   Urine analysis:    Component Value Date/Time   COLORURINE YELLOW 11/24/2016 [place] 11/24/2016 0457   LABSPEC 1.028 11/24/2016 [place] 5.0 11/24/2016 [place] NEGATIVE 11/24/2016 [place] NEGATIVE 11/24/2016 [place] NEGATIVE 11/24/2016 0457   BILIRUBINUR n 06/09/2011 1154   KETONESUR NEGATIVE 11/24/2016 0457   PROTEINUR NEGATIVE 11/24/2016 0457   UROBILINOGEN 1.0 05/09/2015 1844   NITRITE NEGATIVE 11/24/2016 0457   LEUKOCYTESUR NEGATIVE 11/24/2016 0457     Radiological Exams on Admission: Ct Abdomen Pelvis W Contrast  Result Date: 11/24/2016 CLINICAL DATA:  Periumbilical pain with nausea for 1 week. Diarrhea. EXAM: CT ABDOMEN AND PELVIS WITH CONTRAST TECHNIQUE: Multidetector CT imaging of the abdomen and pelvis was performed using the standard protocol following bolus administration of intravenous contrast.  CONTRAST:  140m ISOVUE-300 IOPAMIDOL (ISOVUE-300) INJECTION 61% COMPARISON:  March 19, 2016 FINDINGS: Lower chest: There is patchy bibasilar lung atelectasis. There is slight lower lobe bronchiectatic change. Hepatobiliary: No focal liver lesions are evident. Slight periportal edema noted on the prior study remains present. Gallbladder is absent. There is no appreciable biliary duct dilatation. Pancreas: No pancreatic mass or inflammatory focus is evident. Spleen: Spleen measures 15.2 x 12.9 x 5.5 cm with a measured splenic volume of 539 cubic cm. No focal splenic lesions are appreciable. Adrenals/Urinary Tract: Adrenals appear unremarkable bilaterally. Parapelvic cysts are again noted with the largest parapelvic cyst on the left measuring 2.4 x 1.6 cm. No hydronephrosis is evident on either side. There is no renal or ureteral calculus. No hydronephrosis on either side. Urinary bladder is midline with wall thickness within normal limits. Stomach/Bowel: There is borderline thickening of the wall of the rectum without perirectal stranding or perirectal abscess. There is somewhat asymmetric wall thickening along the leftward aspect of the rectal wall. Several loops of jejunum have borderline thickened walls. There is moderate fluid throughout much of the small bowel. There is no transition zone suggesting focal bowel obstruction. No free air or portal venous air is appreciable. The patient has had previous gastric bypass surgery without wall thickening or fluid in the areas of previous surgery. Vascular/Lymphatic: There is no abdominal aortic aneurysm. No vascular lesions are appreciable on this study. There are multiple mesenteric lymph nodes in the right abdomen, largest measuring 1.1 x 0.9 cm. No larger lymph nodes are noted elsewhere in the abdomen or pelvis. Reproductive: There are occasional prostatic calculi. The prostate and seminal vesicles appear normal in size and contour. Other: Appendix appears  unremarkable. There is no appreciable ascites or abscess in the  abdomen or pelvis. Musculoskeletal: There is degenerative change in the lumbar spine. No blastic or lytic bone lesions. No intramuscular abdominal wall lesions. IMPRESSION: There is mild wall thickening in several loops of jejunum with fluid throughout much of the small bowel. Suspect a degree of enteritis. Bowel obstruction is not evident. Mild thickening of the rectal wall is concerning for early proctitis. No abscess or perirectal stranding. Note that there is asymmetric wall thickening along the leftward aspect of the rectum. This finding may warrant proctoscopy to assess for possible developing lesion along the leftward aspect of the rectal wall. Status post gastric bypass surgery without complicating feature in this area. A prominent lymph nodes in the right abdomen potentially could represent a degree of mesenteric enteritis or could be secondary to the apparent enteritis noted in the small bowel. Splenomegaly of uncertain etiology. Stable slight periportal edema of uncertain etiology. Question underlying parenchymal liver disease given this finding and the splenic enlargement. Electronically Signed   By: Lowella Grip III M.D.   On: 11/24/2016 07:46   Dg Abdomen Acute W/chest  Result Date: 11/24/2016 CLINICAL DATA:  Worsening low back pain and low abdominal pain over the last 2 weeks. Cough, nausea. Nonsmoker. History of small bowel obstructions. EXAM: DG ABDOMEN ACUTE W/ 1V CHEST COMPARISON:  Lumbar spine 07/31/2016.  Abdomen 07/23/2016. FINDINGS: Normal heart size and pulmonary vascularity. No focal airspace disease or consolidation in the lungs. No blunting of costophrenic angles. No pneumothorax. Mediastinal contours appear intact. Postoperative changes in the abdomen. Normal heart size and pulmonary vascularity. No focal airspace disease or consolidation in the lungs. No blunting of costophrenic angles. No pneumothorax. Mediastinal  contours appear intact. Scattered gas and stool in the colon. No small or large bowel distention. No free intra-abdominal air. No abnormal air-fluid levels. No radiopaque stones. Widening of the symphysis pubis unchanged since previous studies. IMPRESSION: No evidence of active pulmonary disease. Nonobstructive bowel gas pattern. Electronically Signed   By: Lucienne Capers M.D.   On: 11/24/2016 05:16    EKG: Independently reviewed. Sinus rhythm,? LAD and no acute changes. QTC 433 ms.  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 Acute gastroenteritis: Unclear etiology.? Infectious versus inflammatory. Check C. difficile and GI pathogen panel PCR. Continue IV Zosyn. Discontinue vancomycin-no clear indication and reacted to vancomycin. Full liquid diet. [place] [facility] consulted given abnormal findings on CT concerning for enteritis, proctitis and possible developing lesion along the leftward aspect of the rectal wall. 2. Sepsis: Secondary to problem #1. Met sepsis criteria on admission. Treated per protocol with IV fluids and antibiotics. Improved. Continue IV fluids and broad-spectrum IV antibiotics as above. Follow blood culture results. 3. Hypokalemia: Replace and follow. 4. Acute kidney injury: Secondary to GI losses and poor oral intake. IV fluids and follow BMP. Baseline creatinine may be in the 1.1-1.2 range. Admitted with creatinine of 1.3. 5. Diet-controlled DM 2 with hypoglycemia: Not on medications at home. Diabetes was apparently controlled off of medications after his gastric bypass. However blood glucose 243 on admission. Check A1c and place on SSI. 6. Chronic low back pain: Worsening after physical exertion over the last 4 weeks. Supposed to see  neurosurgery on 3/22. No neurovascular abnormal findings. Lidocaine patch. Pain control. Outpatient follow-up  with neurosurgery. 7. Thrombocytopenia: Chronic and unclear etiology. Stable. Also mild leukopenia which is chronic and intermittent. Follow CBCs periodically. 8. OSA: Not on CPAP. 9. Status post Roux-en-Y gastric bypass   DVT prophylaxis: Lovenox  Code Status: Full  Family Communication: Discussed in detail with patient's spouse at bedside  Disposition Plan: DC home possibly in the next 48 hours. Patient wishes to be discharged on 11/26/16 because he has an upcoming house closing on 11/27/16.  Consults called: [facility]  Admission status: Inpatient, telemetry.    [facility] MD [facility] Pager [phone]  If 7PM-7AM, please contact night-coverage [email or web address] Password [facility]  11/24/2016, 11:39 AM

## 2016-11-24 NOTE — ED Provider Notes (Signed)
Pt has chronic back pain and relates back pain and abdominal pain for the past week. He states the pain is just above his umbilicus and is aching. He has had nausea and dry heaves and a lot of watery diarrhea, about 4 episodes tonight. He has had gallstone pancreatitis in the past and this is similar but different. He states the pain isn't as bad as the bowel perforation he has had or the bowel obstruction. He is also s/p gastric bypass.   Pt is awake, alert. Skin is pale. Abdomen is tender just above the umbilicus, without guarding or rebound.   Medical screening examination/treatment/procedure(s) were conducted as a shared visit with non-physician practitioner(s) and myself.  I personally evaluated the patient during the encounter.   EKG Interpretation  Date/Time:  Monday November 24 2016 05:37:51 EDT Ventricular Rate:  96 PR Interval:    QRS Duration: 88 QT Interval:  342 QTC Calculation: 433 R Axis:   1 Text Interpretation:  Sinus rhythm U waves present No significant change since last tracing 09 May 2015 Confirmed by [facility]  MD-I, Ezechiel Stooksbury ([id number]) on 11/24/2016 5:43:54 AM       Rolland Porter, MD, Barbette Or, MD 11/24/16 [phone]

## 2016-11-24 NOTE — ED Notes (Signed)
Second set of blood cultures obtained from left forearm at 0535

## 2016-11-25 ENCOUNTER — Telehealth: Payer: Self-pay

## 2016-11-25 LAB — GASTROINTESTINAL PANEL BY PCR, STOOL (REPLACES STOOL CULTURE)
ADENOVIRUS F40/41: NOT DETECTED
ASTROVIRUS: NOT DETECTED
CAMPYLOBACTER SPECIES: DETECTED — AB
CRYPTOSPORIDIUM: NOT DETECTED
Cyclospora cayetanensis: NOT DETECTED
ENTEROPATHOGENIC E COLI (EPEC): NOT DETECTED
Entamoeba histolytica: NOT DETECTED
Enteroaggregative E coli (EAEC): NOT DETECTED
Enterotoxigenic E coli (ETEC): NOT DETECTED
Giardia lamblia: NOT DETECTED
Norovirus GI/GII: NOT DETECTED
PLESIMONAS SHIGELLOIDES: NOT DETECTED
ROTAVIRUS A: NOT DETECTED
SAPOVIRUS (I, II, IV, AND V): NOT DETECTED
Salmonella species: NOT DETECTED
Shiga like toxin producing E coli (STEC): NOT DETECTED
Shigella/Enteroinvasive E coli (EIEC): NOT DETECTED
VIBRIO SPECIES: NOT DETECTED
Vibrio cholerae: NOT DETECTED
YERSINIA ENTEROCOLITICA: NOT DETECTED

## 2016-11-25 LAB — CBC
HEMATOCRIT: 37.9 % — AB (ref 39.0–52.0)
Hemoglobin: 12.8 g/dL — ABNORMAL LOW (ref 13.0–17.0)
MCH: 31.5 pg (ref 26.0–34.0)
MCHC: 33.8 g/dL (ref 30.0–36.0)
MCV: 93.3 fL (ref 78.0–100.0)
PLATELETS: 138 10*3/uL — AB (ref 150–400)
RBC: 4.06 MIL/uL — ABNORMAL LOW (ref 4.22–5.81)
RDW: 13.2 % (ref 11.5–15.5)
WBC: 3.9 10*3/uL — AB (ref 4.0–10.5)

## 2016-11-25 LAB — BASIC METABOLIC PANEL
Anion gap: 6 (ref 5–15)
BUN: 7 mg/dL (ref 6–20)
CALCIUM: 9.1 mg/dL (ref 8.9–10.3)
CO2: 27 mmol/L (ref 22–32)
CREATININE: 0.97 mg/dL (ref 0.61–1.24)
Chloride: 109 mmol/L (ref 101–111)
GFR calc Af Amer: >60 mL/min (ref >60)
GFR calc non Af Amer: >60 mL/min (ref >60)
GLUCOSE: 96 mg/dL (ref 65–99)
POTASSIUM: 4.2 mmol/L (ref 3.5–5.1)
SODIUM: 142 mmol/L (ref 135–145)

## 2016-11-25 LAB — URINE CULTURE: Culture: NO GROWTH

## 2016-11-25 LAB — HEMOGLOBIN A1C
HEMOGLOBIN A1C: 5.1 % (ref 4.8–5.6)
MEAN PLASMA GLUCOSE: 100 mg/dL

## 2016-11-25 LAB — GLUCOSE, CAPILLARY
GLUCOSE-CAPILLARY: 80 mg/dL (ref 65–99)
Glucose-Capillary: 132 mg/dL — ABNORMAL HIGH (ref 65–99)

## 2016-11-25 LAB — HIV ANTIBODY (ROUTINE TESTING W REFLEX): HIV Screen 4th Generation wRfx: NONREACTIVE

## 2016-11-25 MED ORDER — METRONIDAZOLE 500 MG PO TABS: 500.0000 mg | ORAL_TABLET | Freq: Three times a day (TID) | ORAL | 0 refills | Status: DC

## 2016-11-25 MED ORDER — CIPROFLOXACIN HCL 500 MG PO TABS: 500.0000 mg | ORAL_TABLET | Freq: Two times a day (BID) | ORAL | 0 refills | Status: DC

## 2016-11-25 NOTE — Telephone Encounter (Signed)
-----   Message from Levin Erp, [place] sent at 11/25/2016  9:49 AM EDT ----- Regarding: OV Please arrange hosp fu visit in 2-3 weeks with either Dr. Carlean Purl or myself. He is being discharged this afternoon.  Thanks-JLL

## 2016-11-25 NOTE — Progress Notes (Addendum)
CRITICAL VALUE ALERT  Critical value received: GI stool panel positive for Camylobacter  Date of notification:  11/25/16  Time of notification:  6834  Critical value read back: yes  Nurse who received alert: Sirena Riddle,rn  MD notified (1st page): short  Time of first page: 1355  MD notified (2nd page):  Time of second page:  Responding MD:   Time MD responded:

## 2016-11-25 NOTE — Discharge Summary (Signed)
Physician Discharge Summary  Hector [name] TTS:[id number] DOB: Jan 23, 1961 DOA: 11/24/2016  PCP: Lottie Dawson, MD  Admit date: 11/24/2016 Discharge date: 11/25/2016  Admitted From: home  Disposition:  home  Recommendations for Outpatient Follow-up:  1. Follow up with Dr. Carlean Purl at already scheduled follow up in early April 2. Follow up with Dr. Ellene Route for additional narcotic prescriptions for low back pain 3. Follow up GI pathogen panel and final results of pending blood cultures  Home Health:  none  Equipment/Devices:  None  Discharge Condition:  Stable, improved CODE STATUS:  full  Diet recommendation:  regular   Brief/Interim Summary:  Hector [name] is a 56 y.o. Caucasian male with a past medical history of DM2 that resolved after gastric bypass, GERD, HLD, OSA on CPAP, perforated ulcer, cholecystectomy, gastric bypass, hiatal hernia repair, multiple low back surgereis and chronic low back pain, who presented to the ED with worsening lower back pain, abdominal pain, subjective fevers and chills, and increased diarrhea.  In the ER, he was febrile to 101.8, was tachycardicin the 100s.  CT abdomen demonstrated enteritis and proctitis.  He was given IVF and antibiotics and his abdominal and back pain improved.  He felt back to his baseline and was asking to go home on 11/25/2016.  He appears to have chronic back pain and has received narcotic prescriptions for several different prescribers in the past.  He asked for a prescription, however, I feel it is best that he talk to Dr. Ellene Route or his PCP to establish a pain contract if he is going to continue to use narcotics for his chronic pain.  He may use tylenol, heating pads, massage, stretching to achieve pain relief pending his follow up appointment.     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  C. Diff PCR negative.  GI pathogen panel is pending.  He was started on zosyn and seen by gastroenterology.  Dr. Carlean Purl performed DRE and felt a probable thrombosed internal hemorrhoid which may have contributed to the proctitis seen on CT.  This would not have explained his systemic symptoms.  He was able to tolerate a soft diet and will have close outpatient follow up with gastroenterology.  Will give him a prescription for ciprofloxacin and flagyl to complete a 7-day course.    Hypokalemia due to diarrhea, resolved with supplementation.  Acute kidney injury due to dehydration, creatinine trended down from 1.3 to 0.97 with IVF.    Diabetes mellitus type 2, in remission due to weight loss.  Hyperglycemia at admission was likely due to stress reaction and resolved quickly.  A1c is 5.1.     Acute exacerbation of chronic low back pain, exacerbated by packing moving boxes.  Recommend tylenol, massage, and heat.  May try OTC salonpas also. Follow up with neurosurgery at already scheduled appointment.  Recommend he sign a pain contract as he appears to be getting narcotic prescriptions from numerous providers for various doses and numbers of tabs.    Mild pancytopenia, likely due to acute viral illness superimposed on chronic neutropenia and occasional mild thrombocytopenia.  Question if this may be due to a vitamin deficiency from his considerable weight loss and roux-en-y surgery.  Repeat  CBC in a few weeks once acute febrile illness has resolved.  Followed previously by Hematology Dr. Beryle Beams.      Discharge Instructions  Discharge Instructions    Call MD for:  difficulty breathing, headache or visual disturbances    Complete by:  As directed    Call MD for:  extreme fatigue    Complete by:  As directed    Call MD for:  hives    Complete by:   As directed    Call MD for:  persistant dizziness or light-headedness    Complete by:  As directed    Call MD for:  persistant nausea and vomiting    Complete by:  As directed    Call MD for:  severe uncontrolled pain    Complete by:  As directed    Call MD for:  temperature >100.4    Complete by:  As directed    Diet general    Complete by:  As directed    Discharge instructions    Complete by:  As directed    Please continue antibiotics for 7-days for enteritis/proctitis with ciprofloxacin and metronidazole.  Please follow up with gastroenterology next month.  For your back pain, please call Dr. Clarice Pole office if you feel you need a prescription for narcotics.  Please use tylenol, massage, heat, and your muscle relaxants for symptom management.  Talk to Dr. Ellene Route about a pain contract if you are going to continue to use narcotic pain medications.   Increase activity slowly    Complete by:  As directed        Medication List    TAKE these medications   amitriptyline 50 MG tablet Commonly known as:  ELAVIL TAKE 1 TABLET AS NEEDED FOR SLEEP   ciprofloxacin 500 MG tablet Commonly known as:  CIPRO Take 1 tablet (500 mg total) by mouth 2 (two) times daily.   cyclobenzaprine 10 MG tablet Commonly known as:  FLEXERIL Take 1 tablet by mouth 2 (two) times daily as needed for muscle spasms.   escitalopram 20 MG tablet Commonly known as:  LEXAPRO Take 20 mg by mouth every morning.   lamoTRIgine 100 MG tablet Commonly known as:  LAMICTAL Take 100-200 mg by mouth 2 (two) times daily. Take 200 mg in the am and 100 mg qhs.   methocarbamol 750 MG tablet Commonly known as:  ROBAXIN Take 750 mg by mouth every 6 (six) hours as needed for muscle spasms.   metroNIDAZOLE 500 MG tablet Commonly known as:  FLAGYL Take 1 tablet (500 mg total) by mouth 3 (three) times daily.   multivitamin tablet Take 1 tablet by mouth daily.   ondansetron 4 MG tablet Commonly known as:  ZOFRAN Take  4 mg by mouth every 8 (eight) hours as needed for nausea or vomiting.   traZODone 100 MG tablet Commonly known as:  DESYREL Take 300 mg by mouth at bedtime.   TYLENOL ARTHRITIS PAIN 650 MG CR tablet Generic drug:  acetaminophen Take 650 mg by mouth every 8 (eight) hours as needed for pain.   vitamin B-12 1000 MCG tablet Commonly known as:  CYANOCOBALAMIN Take 1,000 mcg by mouth daily.   VITAMIN D-1000 MAX ST 1000 units tablet Generic drug:  Cholecalciferol Take 1,000 Units by mouth daily.      Follow-up Information    Silvano Rusk, MD. Go on 01/02/2017.   Specialty:  Gastroenterology Contact information: [address] [place] [address] [phone]  Earleen Newport, MD. Go to.   Specialty:  Neurosurgery Why:  already scheduled appointment Contact information: [address] [place] [address] [phone]          Allergies  Allergen Reactions  . Aspirin Other (See Comments)    perforated ulcer  . Cymbalta [Duloxetine Hcl] Other (See Comments)    Over-sedating; slept over 24 hours with one dose.  Possible serotonin syndrome.  . Tape Rash    Surgical tape  . Nsaids Other (See Comments)    ulcer  . Hydrocodone Other (See Comments)    headache  . Vancomycin Rash    Red man syndrome.  Please premedicate.    Consultations: [facility], Dr. Carlean Purl   Procedures/Studies: Ct Abdomen Pelvis W Contrast  Result Date: 11/24/2016 CLINICAL DATA:  Periumbilical pain with nausea for 1 week. Diarrhea. EXAM: CT ABDOMEN AND PELVIS WITH CONTRAST TECHNIQUE: Multidetector CT imaging of the abdomen and pelvis was performed using the standard protocol following bolus administration of intravenous contrast. CONTRAST:  161mL ISOVUE-300 IOPAMIDOL (ISOVUE-300) INJECTION 61% COMPARISON:  March 19, 2016 FINDINGS: Lower chest: There is patchy bibasilar lung atelectasis. There is slight lower lobe bronchiectatic change. Hepatobiliary: No  focal liver lesions are evident. Slight periportal edema noted on the prior study remains present. Gallbladder is absent. There is no appreciable biliary duct dilatation. Pancreas: No pancreatic mass or inflammatory focus is evident. Spleen: Spleen measures 15.2 x 12.9 x 5.5 cm with a measured splenic volume of 539 cubic cm. No focal splenic lesions are appreciable. Adrenals/Urinary Tract: Adrenals appear unremarkable bilaterally. Parapelvic cysts are again noted with the largest parapelvic cyst on the left measuring 2.4 x 1.6 cm. No hydronephrosis is evident on either side. There is no renal or ureteral calculus. No hydronephrosis on either side. Urinary bladder is midline with wall thickness within normal limits. Stomach/Bowel: There is borderline thickening of the wall of the rectum without perirectal stranding or perirectal abscess. There is somewhat asymmetric wall thickening along the leftward aspect of the rectal wall. Several loops of jejunum have borderline thickened walls. There is moderate fluid throughout much of the small bowel. There is no transition zone suggesting focal bowel obstruction. No free air or portal venous air is appreciable. The patient has had previous gastric bypass surgery without wall thickening or fluid in the areas of previous surgery. Vascular/Lymphatic: There is no abdominal aortic aneurysm. No vascular lesions are appreciable on this study. There are multiple mesenteric lymph nodes in the right abdomen, largest measuring 1.1 x 0.9 cm. No larger lymph nodes are noted elsewhere in the abdomen or pelvis. Reproductive: There are occasional prostatic calculi. The prostate and seminal vesicles appear normal in size and contour. Other: Appendix appears unremarkable. There is no appreciable ascites or abscess in the abdomen or pelvis. Musculoskeletal: There is degenerative change in the lumbar spine. No blastic or lytic bone lesions. No intramuscular abdominal wall lesions. IMPRESSION:  There is mild wall thickening in several loops of jejunum with fluid throughout much of the small bowel. Suspect a degree of enteritis. Bowel obstruction is not evident. Mild thickening of the rectal wall is concerning for early proctitis. No abscess or perirectal stranding. Note that there is asymmetric wall thickening along the leftward aspect of the rectum. This finding may warrant proctoscopy to assess for possible developing lesion along the leftward aspect of the rectal wall. Status post gastric bypass surgery without complicating feature in this area. A prominent lymph nodes in the right abdomen potentially could  represent a degree of mesenteric enteritis or could be secondary to the apparent enteritis noted in the small bowel. Splenomegaly of uncertain etiology. Stable slight periportal edema of uncertain etiology. Question underlying parenchymal liver disease given this finding and the splenic enlargement. Electronically Signed   By: Lowella Grip III M.D.   On: 11/24/2016 07:46   Dg Abdomen Acute W/chest  Result Date: 11/24/2016 CLINICAL DATA:  Worsening low back pain and low abdominal pain over the last 2 weeks. Cough, nausea. Nonsmoker. History of small bowel obstructions. EXAM: DG ABDOMEN ACUTE W/ 1V CHEST COMPARISON:  Lumbar spine 07/31/2016.  Abdomen 07/23/2016. FINDINGS: Normal heart size and pulmonary vascularity. No focal airspace disease or consolidation in the lungs. No blunting of costophrenic angles. No pneumothorax. Mediastinal contours appear intact. Postoperative changes in the abdomen. Normal heart size and pulmonary vascularity. No focal airspace disease or consolidation in the lungs. No blunting of costophrenic angles. No pneumothorax. Mediastinal contours appear intact. Scattered gas and stool in the colon. No small or large bowel distention. No free intra-abdominal air. No abnormal air-fluid levels. No radiopaque stones. Widening of the symphysis pubis unchanged since previous  studies. IMPRESSION: No evidence of active pulmonary disease. Nonobstructive bowel gas pattern. Electronically Signed   By: Lucienne Capers M.D.   On: 11/24/2016 05:16   Subjective: States he feels much better.  He came to the ER for his worsening back pain not because of his fever and diarrhea.  Would like to have a prescription for pain medication and be discharged.  Abdominal pain has resolved.  He had lumpy formed stool and tolerated solid foods diet.    Discharge Exam: Vitals:   11/24/16 2200 11/25/16 0602  BP: 105/69 125/83  Pulse: 77 68  Resp: 20 18  Temp: 97.8 F (36.6 C) 97.7 F (36.5 C)   Vitals:   11/24/16 1100 11/24/16 1454 11/24/16 2200 11/25/16 0602  BP: 138/83 126/73 105/69 125/83  Pulse: 82 79 77 68  Resp: 18 16 20 18   Temp: 98.4 F (36.9 C) 98.2 F (36.8 C) 97.8 F (36.6 C) 97.7 F (36.5 C)  TempSrc: Oral Oral Oral Oral  SpO2: 97% 97% 98% 100%  Weight: 74.7 kg (164 lb 10.9 oz)     Height: 5\' 10"  (1.778 m)       General: Thin adult male, pt is alert, awake, not in acute distress Cardiovascular: RRR, S1/S2 +, no rubs, no gallops Respiratory: CTA bilaterally, no wheezing, no rhonchi Abdominal:   Normal active bowel sounds, soft, nondistended, nontender Extremities: no edema, no cyanosis    The results of significant diagnostics from this hospitalization (including imaging, microbiology, ancillary and laboratory) are listed below for reference.     Microbiology: Recent Results (from the past 240 hour(s))  Urine culture     Status: None   Collection Time: 11/24/16  4:57 AM  Result Value Ref Range Status   Specimen Description URINE, CLEAN CATCH  Final   Special Requests NONE  Final   Culture   Final    NO GROWTH Performed at [facility], [address], [place], [place] [address]    Report Status 11/25/2016 FINAL  Final  C difficile quick scan w PCR reflex     Status: None   Collection Time: 11/24/16 11:48 AM  Result Value Ref Range Status    C Diff antigen NEGATIVE NEGATIVE Final   C Diff toxin NEGATIVE NEGATIVE Final   C Diff interpretation No C. difficile detected.  Final  Labs: BNP (last 3 results) No results for input(s): BNP in the last 8760 hours. Basic Metabolic Panel:  Recent Labs Lab 11/24/16 0431 11/25/16 0628  NA 136 142  K 3.3* 4.2  CL 104 109  CO2 22 27  GLUCOSE 243* 96  BUN 13 7  CREATININE 1.30* 0.97  CALCIUM 8.7* 9.1   Liver Function Tests:  Recent Labs Lab 11/24/16 0431  AST 24  ALT 9*  ALKPHOS 64  BILITOT 0.5  PROT 6.4*  ALBUMIN 3.7    Recent Labs Lab 11/24/16 0431  LIPASE 14   No results for input(s): AMMONIA in the last 168 hours. CBC:  Recent Labs Lab 11/24/16 0431 11/25/16 0628  WBC 3.5* 3.9*  HGB 13.5 12.8*  HCT 38.8* 37.9*  MCV 93.5 93.3  PLT 139* 138*   Cardiac Enzymes: No results for input(s): CKTOTAL, CKMB, CKMBINDEX, TROPONINI in the last 168 hours. BNP: Invalid input(s): POCBNP CBG:  Recent Labs Lab 11/24/16 1153 11/24/16 1717 11/24/16 2149 11/25/16 0817  GLUCAP 110* 98 97 80   D-Dimer No results for input(s): DDIMER in the last 72 hours. Hgb A1c  Recent Labs  11/24/16 0431  HGBA1C 5.1   Lipid Profile No results for input(s): CHOL, HDL, LDLCALC, TRIG, CHOLHDL, LDLDIRECT in the last 72 hours. Thyroid function studies No results for input(s): TSH, T4TOTAL, T3FREE, THYROIDAB in the last 72 hours.  Invalid input(s): FREET3 Anemia work up No results for input(s): VITAMINB12, FOLATE, FERRITIN, TIBC, IRON, RETICCTPCT in the last 72 hours. Urinalysis    Component Value Date/Time   COLORURINE YELLOW 11/24/2016 [place] 11/24/2016 0457   LABSPEC 1.028 11/24/2016 0457   PHURINE 5.0 11/24/2016 0457   GLUCOSEU NEGATIVE 11/24/2016 0457   HGBUR NEGATIVE 11/24/2016 0457   BILIRUBINUR NEGATIVE 11/24/2016 0457   BILIRUBINUR n 06/09/2011 1154   KETONESUR NEGATIVE 11/24/2016 0457   PROTEINUR NEGATIVE 11/24/2016 0457    UROBILINOGEN 1.0 05/09/2015 1844   NITRITE NEGATIVE 11/24/2016 0457   LEUKOCYTESUR NEGATIVE 11/24/2016 0457   Sepsis Labs Invalid input(s): PROCALCITONIN,  WBC,  LACTICIDVEN   Time coordinating discharge: Over 30 minutes  SIGNED:   Janece Canterbury, MD  [facility] 11/25/2016, 12:50 PM Pager   If 7PM-7AM, please contact night-coverage [email or web address] Password [email or web address]

## 2016-11-25 NOTE — Progress Notes (Signed)
Patient is a&ox4, ambulatory without assist. No change from am assessment. Discharge instructions reviewed, questions, concerns denied.

## 2016-11-25 NOTE — Telephone Encounter (Signed)
appt has been made for 01/02/17 for Dr Carlean Purl

## 2016-11-25 NOTE — Progress Notes (Signed)
    Progress Note   Subjective  Chief Complaint: Abnormal CT of the abdomen, diarrhea  Patient reports that he is much improved this morning, he has no further abdominal pain and his diarrhea has seemed to slow and even increased in consistency. Patient is eager to return home as they are moving on Thursday. He does tell me he continues with back pain.    Objective   Vital signs in last 24 hours: Temp:  [97.7 F (36.5 C)-98.4 F (36.9 C)] 97.7 F (36.5 C) (03/13 0602) Pulse Rate:  [68-87] 68 (03/13 0602) Resp:  [14-21] 18 (03/13 0602) BP: (105-138)/(69-93) 125/83 (03/13 0602) SpO2:  [91 %-100 %] 100 % (03/13 0602) Weight:  [164 lb 10.9 oz (74.7 kg)] 164 lb 10.9 oz (74.7 kg) (03/12 1100) Last BM Date: 11/24/16 General: Caucasian male in NAD Heart:  Regular rate and rhythm; no murmurs Lungs: Respirations even and unlabored, lungs CTA bilaterally Abdomen:  Soft, nontender and nondistended. Normal bowel sounds. Extremities:  Without edema. Neurologic:  Alert and oriented,  grossly normal neurologically. Psych:  Cooperative. Normal mood and affect.  Lab Results:  Recent Labs  11/24/16 0431 11/25/16 0628  WBC 3.5* 3.9*  HGB 13.5 12.8*  HCT 38.8* 37.9*  PLT 139* 138*   BMET  Recent Labs  11/24/16 0431 11/25/16 0628  NA 136 142  K 3.3* 4.2  CL 104 109  CO2 22 27  GLUCOSE 243* 96  BUN 13 7  CREATININE 1.30* 0.97  CALCIUM 8.7* 9.1       Assessment / Plan:   Assessment: 1. Acute gastroenteritis: Rectal exam with tender mobile nodule in the anal canal, suspected to be thrombosed hemorrhoid, again infectious gastroenteritis is favored though internal hernia could cause similar picture, patient continues to improve today 2. Sepsis: Was likely due to above 3. Hypokalemia: Now resolved 4. Chronic low back pain  Plan: 1. Agree with patient being discharged today 2. Will set patient up for follow-up in our office in the next 2-3 weeks with Dr. Carlean Purl myself 3.  Please await any further recommendations from Dr. Carlean Purl  : LOS: 1 day   [facility]  11/25/2016, 9:44 AM  Pager # [phone]    [facility] Attending   I have taken an interval history, reviewed the chart and examined the patient. I agree with the Advanced Practitioner's note, impression and recommendations.    Much improved - going home  Gatha Mayer, MD, [facility] [phone] (pager) [phone] after 5 PM, weekends and holidays  11/25/2016 12:38 PM

## 2016-11-29 LAB — CULTURE, BLOOD (ROUTINE X 2)
Culture: NO GROWTH
Culture: NO GROWTH

## 2016-12-01 ENCOUNTER — Ambulatory Visit: Payer: Federal, State, Local not specified - PPO | Admitting: Internal Medicine

## 2017-01-02 ENCOUNTER — Ambulatory Visit: Payer: Federal, State, Local not specified - PPO | Admitting: Internal Medicine

## 2017-01-19 DIAGNOSIS — F3132 Bipolar disorder, current episode depressed, moderate: Secondary | ICD-10-CM | POA: Diagnosis not present

## 2017-02-26 ENCOUNTER — Telehealth: Payer: Self-pay | Admitting: Internal Medicine

## 2017-02-26 NOTE — Telephone Encounter (Signed)
He can come in 52   Tomorrow .  For acute one problem visit

## 2017-02-26 NOTE — Telephone Encounter (Signed)
Pt states he is definitely having high blood pressure with some irritability. Pt had appt on 6/20 but feels he need to see you asap.   No appts on Friday.   Would like to know if you will work him in end of day.  Please advise.  Pt declined to see another provider.

## 2017-02-26 NOTE — Telephone Encounter (Signed)
Pt has been scheduled.  °

## 2017-02-26 NOTE — Progress Notes (Signed)
Chief Complaint  Patient presents with  . Blood Pressure Check    elevawetd elsewhere     HPI: Hector [name] 56 y.o. come ifor acute work in appt because of concerns about  BP  He is sp gastric bypass with dm in remission with hx ht fatty liver improve  Gi bleed back spinal pain osa  Headaches and  behnavioral health treatment  But is now going to retre from feds and training for  EMT.    class   426/STMH er diastolic   962 229  798 or so   Both arms.   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   Not injection  Didn't help .  cause of cost.   .     Dr. Hal Neer did surgery .   INdocin once every  Couple weeks   2 doses.      Asks for refill and is aware of gi risk has asked his surgeon an ok if sparsely used  ( hx of gi bleeding  After  bypas etc )    Had   Campylobacter  Infection and and pancreatitis .    prob from chicken    exposures .   ROS: See pertinent positives and negatives per HPI. No cp sob   Psych working on irritability but no new meds  Sleep apnea no longer an issues   etoh reg bnsaids etc   Past Medical History:  Diagnosis Date  . Abnormal LFTs    improved after gastic bypass  . ADJ DISORDER WITH MIXED ANXIETY & DEPRESSED MOOD 08/23/2009   Qualifier: Diagnosis of  By: [name] MD, Standley Brooking Now on lamictal and lexapro and elevil for sleep.   . Anginal pain ([place])   . Anxiety   . B12 nutritional deficiency   . Chest pain    hopsitalized 2 14 evaluation  . Closed head injury    laceration  9 12  no loc  vision change  . DDD (degenerative disc disease)   . Diabetes mellitus    resolved with gastric bypass  . DIABETES MELLITUS, TYPE II 04/06/2007   Qualifier: Diagnosis of  By: [name]  MD, Standley Brooking Improved and off meds since gastric bypass   . GERD (gastroesophageal reflux disease)    no meds  . Gout   . Headache    once per week uses Indomethicin hx of cluster headaches   . History of bunionectomy of left great toe   . History of concussion   . History of renal stone    Seen in emergency room not urologist  . Hyperlipidemia   . Hypertension 02/27/2017  . OBESITY 12/10/2007   Qualifier: Diagnosis of  By: [name] MD, Standley Brooking Gastric bypass   . OBSTRUCTIVE SLEEP APNEA 07/22/2007   Qualifier: Diagnosis of  By: [name] RN, Doroteo Bradford    . OSA (obstructive sleep apnea) 12/12/2013  . Pancreatitis 03/21/2016   went to   . Perforated ulcer ([place])    Surgical repair9/24 2011  . Sleep apnea    had gastric bypass/ sa-gone  . 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 Uterine cancer Mother   . Hypertension Mother   . Cervical cancer Mother   . Heart disease Father   . COPD Father   . Colon cancer Maternal Uncle   . Colon cancer Paternal Aunt     Social History   Social History  . Marital status: Married    Spouse name: Hector [name]  . Number of children: 2  . Years of education: Associates   Occupational History  . [employer] Scientific laboratory technician Admin   Social History Main Topics  . Smoking status: Never Smoker  . Smokeless tobacco: Never Used  . Alcohol use Yes     Comment: occas liquor   . Drug use: No  . Sexual activity: Yes   Other Topics Concern  . 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   Patient has two children.   Patient has a college education.   Regular exercise-no   Employed Event organiser and investigation   school travels       10 hours work     Sleep  No longer needs  cpap   Patient is right-handed.   Patient drinks one soda daily.   trainiing for triathalon          Outpatient Medications Prior to Visit  Medication Sig  Dispense Refill  . acetaminophen (TYLENOL ARTHRITIS PAIN) 650 MG CR tablet Take 650 mg by mouth every 8 (eight) hours as needed for pain.    [name] amitriptyline (ELAVIL) 50 MG tablet TAKE 1 TABLET AS NEEDED FOR SLEEP 30 tablet 0  . Cholecalciferol (VITAMIN D-1000 MAX ST) 1000 units tablet Take 1,000 Units by mouth daily.    [name] escitalopram (LEXAPRO) 20 MG tablet Take 20 mg by mouth every morning.     . lamoTRIgine (LAMICTAL) 100 MG tablet Take 100-200 mg by mouth 2 (two) times daily. Take 200 mg in the am and 100 mg qhs.    . Multiple Vitamin (MULTIVITAMIN) tablet Take 1 tablet by mouth daily.    . ondansetron (ZOFRAN) 4 MG tablet Take 4 mg by mouth every 8 (eight) hours as needed for nausea or vomiting.    . traZODone (DESYREL) 100 MG tablet Take 300 mg by mouth at bedtime.     . vitamin B-12 (CYANOCOBALAMIN) 1000 MCG tablet Take 1,000 mcg by mouth daily.    . ciprofloxacin (CIPRO) 500 MG tablet Take 1 tablet (500 mg total) by mouth 2 (two) times daily. 12 tablet 0  . metroNIDAZOLE (FLAGYL) 500 MG tablet Take 1 tablet (500 mg total) by mouth 3 (three) times daily. 18 tablet 0  . cyclobenzaprine (FLEXERIL) 10 MG tablet Take 1 tablet by mouth 2 (two) times daily as needed for muscle spasms.     . methocarbamol (ROBAXIN) 750 MG tablet Take 750 mg by mouth every 6 (six) hours as needed for muscle spasms.     No facility-administered medications prior to visit.      EXAM:  BP 130/80 (BP Location: Left Arm, Patient Position: Sitting, Cuff Size: Normal)   Pulse 78   Temp 98 F (36.7 C) (Oral)   Ht 5\' 10"  (1.778 m)   Wt 174 lb 9.6 oz (79.2 kg)   BMI 25.05 kg/m   Body mass index is 25.05 kg/m. Repeat bp 138/86right left 138/80  Reg size  GENERAL: vitals reviewed and  listed above, alert, oriented, appears well hydrated and in no acute distress HEENT: atraumatic, conjunctiva  clear, no obvious abnormalities on inspection of external nose and ears NECK: no obvious masses on inspection palpation    LUNGS: clear to auscultation bilaterally, no wheezes, rales or rhonchi, good air movement CV: HRRR, no clubbing cyanosis or  peripheral edema nl cap refill  Abdomen:  Sof,t normal bowel sounds without hepatosplenomegaly, no guarding rebound or masses no CVA tenderness well healed scars  MS: moves all extremities without noticeable focal  abnormality PSYCH: pleasant and cooperative, no obvious depression or anxiety Lab Results  Component Value Date   WBC 3.9 (L) 11/25/2016   HGB 12.8 (L) 11/25/2016   HCT 37.9 (L) 11/25/2016   PLT 138 (L) 11/25/2016   GLUCOSE 96 11/25/2016   CHOL 101 03/20/2016   TRIG 120 03/20/2016   HDL 34 (L) 03/20/2016   LDLDIRECT 113.2 04/01/2007   LDLCALC 43 03/20/2016   ALT 9 (L) 11/24/2016   AST 24 11/24/2016   NA 142 11/25/2016   K 4.2 11/25/2016   CL 109 11/25/2016   CREATININE 0.97 11/25/2016   BUN 7 11/25/2016   CO2 27 11/25/2016   TSH 0.93 01/07/2016   PSA 0.76 04/09/2015   INR 1.07 03/20/2016   HGBA1C 5.1 11/24/2016   MICROALBUR 0.6 05/15/2014   BP Readings from Last 3 Encounters:  02/27/17 130/80  11/25/16 125/83  09/11/16 (!) 139/94   Wt Readings from Last 3 Encounters:  02/27/17 174 lb 9.6 oz (79.2 kg)  11/24/16 164 lb 10.9 oz (74.7 kg)  05/02/16 174 lb (78.9 kg)    ASSESSMENT AND PLAN:  Discussed the following assessment and plan:  Hypertension, unspecified type - Plan: Basic metabolic panel  Medication management - Plan: Basic metabolic panel  Bariatric surgery status  Chronic bilateral low back pain with right-sided sciatica - under care   [facility]  will refill at this time  further  refills Dr E  avoid reg use  Headaches due to old head injury - ocass idocin use high risk pt aware and checks with specialist reported my reservatinos  can refill x 1 do not take if using regi If headaches requiring more and more Indocin advise gets consult other options. We discussed risk benefit of medicines and conditions patient aware will  get back with [place] readings in a month chemistries in a month and states on medication) blood pressure monitor to an office visit her other medical setting    Wife is a rn and can take  Readings also. Total visit 30 mins > 50% spent counseling and coordinating care as indicated in above note and in instructions to patient .     -Patient advised to return or notify health care team  if  new concerns arise.  Patient Instructions  Get monitor  Take blood pressure readings twice a day for 7- 10 days as we discussed .      Bring  bp  Monitor ot a visit to correlate.  We can start low dose acei    For now.   Send in readings  After a month  .  Then  Plan BMP .    Robaxin    Ok as needed for now.     Standley Brooking. Rutherford Alarie M.D.

## 2017-02-27 ENCOUNTER — Encounter: Payer: Self-pay | Admitting: Internal Medicine

## 2017-02-27 ENCOUNTER — Ambulatory Visit (INDEPENDENT_AMBULATORY_CARE_PROVIDER_SITE_OTHER): Payer: Federal, State, Local not specified - PPO | Admitting: Internal Medicine

## 2017-02-27 VITALS — BP 130/80 | HR 78 | Temp 98.0°F | Ht 70.0 in | Wt 174.6 lb

## 2017-02-27 DIAGNOSIS — I10 Essential (primary) hypertension: Secondary | ICD-10-CM

## 2017-02-27 DIAGNOSIS — G44309 Post-traumatic headache, unspecified, not intractable: Secondary | ICD-10-CM

## 2017-02-27 DIAGNOSIS — M5441 Lumbago with sciatica, right side: Secondary | ICD-10-CM | POA: Diagnosis not present

## 2017-02-27 DIAGNOSIS — Z9884 Bariatric surgery status: Secondary | ICD-10-CM

## 2017-02-27 DIAGNOSIS — S0990XS Unspecified injury of head, sequela: Secondary | ICD-10-CM | POA: Diagnosis not present

## 2017-02-27 DIAGNOSIS — Z79899 Other long term (current) drug therapy: Secondary | ICD-10-CM

## 2017-02-27 DIAGNOSIS — G8929 Other chronic pain: Secondary | ICD-10-CM

## 2017-02-27 HISTORY — DX: Essential (primary) hypertension: I10

## 2017-02-27 MED ORDER — LISINOPRIL 5 MG PO TABS: 5.0000 mg | ORAL_TABLET | Freq: Every day | ORAL | 1 refills | Status: DC

## 2017-02-27 MED ORDER — METHOCARBAMOL 750 MG PO TABS: 750.0000 mg | ORAL_TABLET | Freq: Four times a day (QID) | ORAL | 0 refills | Status: DC | PRN

## 2017-02-27 MED ORDER — INDOMETHACIN 50 MG PO CAPS: ORAL_CAPSULE | ORAL | 0 refills | Status: DC

## 2017-02-27 NOTE — Assessment & Plan Note (Signed)
Reasonable to use low dose acei to begin and  Fu readings month  Send in  And then bmp  Then rov in 3 mos

## 2017-02-27 NOTE — Patient Instructions (Addendum)
Get monitor  Take blood pressure readings twice a day for 7- 10 days as we discussed .      Bring  bp  Monitor ot a visit to correlate.  We can start low dose acei    For now.   Send in readings  After a month  .  Then  Plan BMP .    Robaxin    Ok as needed for now.

## 2017-03-04 ENCOUNTER — Ambulatory Visit: Payer: Federal, State, Local not specified - PPO | Admitting: Internal Medicine

## 2017-04-04 ENCOUNTER — Other Ambulatory Visit: Payer: Self-pay | Admitting: Internal Medicine

## 2017-04-15 DIAGNOSIS — F411 Generalized anxiety disorder: Secondary | ICD-10-CM | POA: Diagnosis not present

## 2017-04-15 DIAGNOSIS — F3132 Bipolar disorder, current episode depressed, moderate: Secondary | ICD-10-CM | POA: Diagnosis not present

## 2017-05-07 DIAGNOSIS — M5416 Radiculopathy, lumbar region: Secondary | ICD-10-CM | POA: Diagnosis not present

## 2017-05-17 ENCOUNTER — Other Ambulatory Visit: Payer: Self-pay | Admitting: Internal Medicine

## 2017-05-20 ENCOUNTER — Other Ambulatory Visit: Payer: Federal, State, Local not specified - PPO

## 2017-05-27 ENCOUNTER — Ambulatory Visit: Payer: Federal, State, Local not specified - PPO | Admitting: Internal Medicine

## 2017-05-28 ENCOUNTER — Emergency Department (HOSPITAL_COMMUNITY): Payer: Federal, State, Local not specified - PPO

## 2017-05-28 ENCOUNTER — Encounter (HOSPITAL_COMMUNITY): Payer: Self-pay

## 2017-05-28 ENCOUNTER — Emergency Department (HOSPITAL_COMMUNITY)
Admission: EM | Admit: 2017-05-28 | Discharge: 2017-05-28 | Disposition: A | Discharge status: Home / Self Care | Payer: Federal, State, Local not specified - PPO | Attending: Emergency Medicine | Admitting: Emergency Medicine

## 2017-05-28 DIAGNOSIS — E119 Type 2 diabetes mellitus without complications: Secondary | ICD-10-CM | POA: Insufficient documentation

## 2017-05-28 DIAGNOSIS — Z79899 Other long term (current) drug therapy: Secondary | ICD-10-CM | POA: Insufficient documentation

## 2017-05-28 DIAGNOSIS — R079 Chest pain, unspecified: Secondary | ICD-10-CM | POA: Diagnosis not present

## 2017-05-28 DIAGNOSIS — R0789 Other chest pain: Secondary | ICD-10-CM | POA: Insufficient documentation

## 2017-05-28 DIAGNOSIS — J45909 Unspecified asthma, uncomplicated: Secondary | ICD-10-CM | POA: Insufficient documentation

## 2017-05-28 DIAGNOSIS — R0602 Shortness of breath: Secondary | ICD-10-CM | POA: Diagnosis not present

## 2017-05-28 DIAGNOSIS — I10 Essential (primary) hypertension: Secondary | ICD-10-CM | POA: Insufficient documentation

## 2017-05-28 LAB — BASIC METABOLIC PANEL
Anion gap: 8 (ref 5–15)
BUN: 7 mg/dL (ref 6–20)
CHLORIDE: 104 mmol/L (ref 101–111)
CO2: 29 mmol/L (ref 22–32)
CREATININE: 1.4 mg/dL — AB (ref 0.61–1.24)
Calcium: 9 mg/dL (ref 8.9–10.3)
GFR calc non Af Amer: 55 mL/min — ABNORMAL LOW (ref >60)
Glucose, Bld: 167 mg/dL — ABNORMAL HIGH (ref 65–99)
Potassium: 3.7 mmol/L (ref 3.5–5.1)
Sodium: 141 mmol/L (ref 135–145)

## 2017-05-28 LAB — CBC
HEMATOCRIT: 44.3 % (ref 39.0–52.0)
Hemoglobin: 15.1 g/dL (ref 13.0–17.0)
MCH: 33.3 pg (ref 26.0–34.0)
MCHC: 34.1 g/dL (ref 30.0–36.0)
MCV: 97.8 fL (ref 78.0–100.0)
PLATELETS: 137 10*3/uL — AB (ref 150–400)
RBC: 4.53 MIL/uL (ref 4.22–5.81)
RDW: 13.5 % (ref 11.5–15.5)
WBC: 3.8 10*3/uL — AB (ref 4.0–10.5)

## 2017-05-28 LAB — I-STAT TROPONIN, ED
Troponin i, poc: 0 ng/mL (ref 0.00–0.08)
Troponin i, poc: 0 ng/mL (ref 0.00–0.08)

## 2017-05-28 LAB — D-DIMER, QUANTITATIVE (NOT AT ARMC): D-Dimer, Quant: 0.78 ug/mL-FEU — ABNORMAL HIGH (ref 0.00–0.50)

## 2017-05-28 MED ORDER — PREDNISONE 50 MG PO TABS: 50.0000 mg | ORAL_TABLET | Freq: Every day | ORAL | 0 refills | Status: DC

## 2017-05-28 MED ORDER — SODIUM CHLORIDE 0.9 % IV BOLUS (SEPSIS)
500.0000 mL | Freq: Once | INTRAVENOUS | Status: AC
  Administered 2017-05-28: 500 mL via INTRAVENOUS

## 2017-05-28 MED ORDER — KETOROLAC TROMETHAMINE 30 MG/ML IJ SOLN: 30.0000 mg | Freq: Once | INTRAMUSCULAR | Status: DC

## 2017-05-28 MED ORDER — TRAMADOL HCL 50 MG PO TABS: 50.0000 mg | ORAL_TABLET | Freq: Four times a day (QID) | ORAL | 0 refills | Status: DC | PRN

## 2017-05-28 MED ORDER — MORPHINE SULFATE (PF) 4 MG/ML IV SOLN
4.0000 mg | Freq: Once | INTRAVENOUS | Status: AC
  Administered 2017-05-28: 4 mg via INTRAVENOUS
  Filled 2017-05-28: qty 1

## 2017-05-28 MED ORDER — IOPAMIDOL (ISOVUE-370) INJECTION 76%
INTRAVENOUS | Status: AC
  Administered 2017-05-28: 60 mL via INTRAVENOUS
  Filled 2017-05-28: qty 100

## 2017-05-28 NOTE — Discharge Instructions (Signed)
Return here as needed.  Follow-up with your primary care doctor  Use heat over the area
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
## 2017-05-28 NOTE — ED Notes (Signed)
Chris PA at bedside   

## 2017-05-28 NOTE — ED Notes (Signed)
Pt called out, states, " This pain is real intense, real sharp.'  Pt rates pain 7/10, reports numbness to lips, LUE, LLE.  No neuro deficits noted at this time. Resp e/u.

## 2017-05-28 NOTE — ED Triage Notes (Signed)
Patient complains of intermittent chest pain for 1 week. Describes as a tightness with inspiration. This am reports a headache, fatigue and left arm numbness. On assessment alert and oriented, no facial droop, equal grips. Speech clear. Ambulatory with steady gait into triage.

## 2017-06-01 NOTE — Progress Notes (Signed)
Chief Complaint  Patient presents with  . Follow-up    Pt seen in ED 05/28/17 for chest pain and left sided numbness - pt still not back to baseline since d/c home. Pt states that BP readings seem to be in a good range. Pt reports BPs ranging 120s/80s -130's/80s    HPI: Hector [name] 56 y.o. come in for follow-up of med check and  And post ed check ? For chest pain felt to be CWP/   And now with headache and  Sinus drainage discolored yellow and a little greenand  Face pain not feeling well but.  And no fever.   Was given  Prednisone in ed  .   No help.  Last dose dailyno help.  He states that the symptoms were chest pressure that was more continuous with some tingly feeling in his left hand and maybe has left leg but no weakness or acute stroke symptoms. He had not felt up for so went to the emergency room had 2 negative troponins borderline elevated d-dimer and a chest CT and lab work. Today he is having a headache. He is been trying not to take his indomethacin that it takes for intermittent headaches in the past  He is best to have an MRI of his back in the near future. He has been given hydrocodone as needed for his back when needed. Blood pressures have been good no new GI symptoms. ROS: See pertinent positives and negatives per HPI.  Past Medical History:  Diagnosis Date  . Abnormal LFTs    improved after gastic bypass  . ADJ DISORDER WITH MIXED ANXIETY & DEPRESSED MOOD 08/23/2009   Qualifier: Diagnosis of  By: [name] MD, Standley Brooking Now on lamictal and lexapro and elevil for sleep.   . Anginal pain ([place])   . Anxiety   . B12 nutritional deficiency   . Chest pain    hopsitalized 2 14 evaluation  . Closed head injury    laceration  9 12  no loc  vision change  . DDD (degenerative disc disease)   . Diabetes mellitus    resolved with gastric bypass  . DIABETES MELLITUS, TYPE II 04/06/2007   Qualifier: Diagnosis of  By: [name] MD, Standley Brooking Improved and off meds since gastric bypass   .  GERD (gastroesophageal reflux disease)    no meds  . Gout   . Headache    once per week uses Indomethicin hx of cluster headaches   . History of bunionectomy of left great toe   . History of concussion   . History of renal stone    Seen in emergency room not urologist  . Hyperlipidemia   . Hypertension 02/27/2017  . OBESITY 12/10/2007   Qualifier: Diagnosis of  By: [name] MD, Standley Brooking Gastric bypass   . OBSTRUCTIVE SLEEP APNEA 07/22/2007   Qualifier: Diagnosis of  By: [name] RN, Doroteo Bradford    . OSA (obstructive sleep apnea) 12/12/2013  . Pancreatitis 03/21/2016   went to [place]  . Perforated ulcer ([place])    Surgical repair9/24 2011  . Sleep apnea    had gastric bypass/ sa-gone  . 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 Uterine cancer Mother   . Hypertension Mother   . Cervical cancer Mother   . Heart disease Father   . COPD Father   . Colon cancer Maternal Uncle   . Colon cancer Paternal Aunt  Social History   Social History  . Marital status: Married    Spouse name: [name]  . Number of children: 2  . Years of education: Associates   Occupational History  . [employer] Scientific laboratory technician Admin   Social History Main Topics  . Smoking status: Never Smoker  . Smokeless tobacco: Never Used  . Alcohol use Yes     Comment: occas liquor   . Drug use: No  . Sexual activity: Yes   Other Topics Concern  . 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   Patient has two children.   Patient has a college education.   Regular exercise-no   Employed Event organiser and investigation   school travels       10 hours work     Sleep  No longer needs  cpap   Patient is right-handed.   Patient drinks one soda daily.   trainiing for triathalon          Outpatient Medications Prior to Visit  Medication Sig Dispense Refill  . acetaminophen (TYLENOL ARTHRITIS PAIN) 650 MG  CR tablet Take 650 mg by mouth every 8 (eight) hours as needed for pain.    [name] amitriptyline (ELAVIL) 50 MG tablet TAKE 1 TABLET AS NEEDED FOR SLEEP 30 tablet 0  . Cholecalciferol (VITAMIN D-1000 MAX ST) 1000 units tablet Take 1,000 Units by mouth daily.    [name] escitalopram (LEXAPRO) 20 MG tablet Take 20 mg by mouth every morning.     . indomethacin (INDOCIN) 50 MG capsule TAKE 1 CAPSULE BY MOUTH 3 TIMES A DAY AS NEEDED FOR HEADACHE. LIMIT REGULAR USE. 40 capsule 0  . lamoTRIgine (LAMICTAL) 100 MG tablet Take 100-200 mg by mouth 2 (two) times daily. Take 200 mg in the am and 100 mg qhs.    . lisinopril (PRINIVIL,ZESTRIL) 5 MG tablet Take 1 tablet (5 mg total) by mouth daily. 90 tablet 1  . methocarbamol (ROBAXIN) 750 MG tablet Take 1 tablet (750 mg total) by mouth every 6 (six) hours as needed for muscle spasms. 40 tablet 0  . Multiple Vitamin (MULTIVITAMIN) tablet Take 1 tablet by mouth daily.    [name] oxyCODONE (OXY IR/ROXICODONE) 5 MG immediate release tablet Take 5 mg by mouth every 6 (six) hours as needed for pain.  0  . predniSONE (DELTASONE) 50 MG tablet Take 1 tablet (50 mg total) by mouth daily with breakfast. 5 tablet 0  . traMADol (ULTRAM) 50 MG tablet Take 1 tablet (50 mg total) by mouth every 6 (six) hours as needed for severe pain. 15 tablet 0  . traZODone (DESYREL) 100 MG tablet Take 300 mg by mouth at bedtime.     . vitamin B-12 (CYANOCOBALAMIN) 1000 MCG tablet Take 1,000 mcg by mouth daily.     No facility-administered medications prior to visit.      EXAM:  BP 118/74 (BP Location: Right Arm, Patient Position: Sitting, Cuff Size: Normal)   Pulse (!) 104   Temp 98 F (36.7 C) (Oral)   Wt 176 lb 9.6 oz (80.1 kg)   BMI 25.34 kg/m   Body mass index is 25.34 kg/m.  GENERAL: vitals reviewed and listed above, alert, oriented, appears well hydrated and in no acute distresshe does look a bit tired and washed out but not acutely toxic HEENT: atraumatic, conjunctiva  clear, no obvious  abnormalities on inspection of external nose and earsTMs clear OP : no  lesion edema or exudate tongue is midline NECK: no obvious masses on inspection palpation  LUNGS: clear to auscultation bilaterally, no wheezes, rales or rhonchi, good air movement CV: HRRR, no clubbing cyanosis or  peripheral edema nl cap refill  Abdomen soft without again medically guarding or rebound well-healed scars. MS: moves all extremities without noticeable focal  abnormality PSYCH: pleasant and cooperative, no obvious depression or anxiety Lab Results  Component Value Date   WBC 3.8 (L) 05/28/2017   HGB 15.1 05/28/2017   HCT 44.3 05/28/2017   PLT 137 (L) 05/28/2017   GLUCOSE 167 (H) 05/28/2017   CHOL 101 03/20/2016   TRIG 120 03/20/2016   HDL 34 (L) 03/20/2016   LDLDIRECT 113.2 04/01/2007   LDLCALC 43 03/20/2016   ALT 9 (L) 11/24/2016   AST 24 11/24/2016   NA 141 05/28/2017   K 3.7 05/28/2017   CL 104 05/28/2017   CREATININE 1.40 (H) 05/28/2017   BUN 7 05/28/2017   CO2 29 05/28/2017   TSH 0.93 01/07/2016   PSA 0.76 04/09/2015   INR 1.07 03/20/2016   HGBA1C 5.1 11/24/2016   MICROALBUR 0.6 05/15/2014   BP Readings from Last 3 Encounters:  06/03/17 118/74  05/28/17 (!) 157/99  02/27/17 130/80   Wt Readings from Last 3 Encounters:  06/03/17 176 lb 9.6 oz (80.1 kg)  05/28/17 170 lb (77.1 kg)  02/27/17 174 lb 9.6 oz (79.2 kg)    ASSESSMENT AND PLAN:  Discussed the following assessment and plan:  Chest pain, unspecified type  Medication management - Plan: Basic metabolic panel, Lipid panel, Ambulatory referral to Neurology  Elevated serum creatinine - Plan: Basic metabolic panel, Lipid panel  Maxillary sinusitis, unspecified chronicity  Frequent headaches - Plan: Basic metabolic panel, Lipid panel, Ambulatory referral to Neurology  Hyperlipidemia, unspecified hyperlipidemia type - Plan: Basic metabolic panel, Lipid panel  Headache, unspecified headache type - Plan: Basic metabolic  panel, Lipid panel  Bariatric surgery status  History of sleep apnea - Plan: Ambulatory referral to Neurology I would prefer he not take the indomethacin for his recurrent headaches. Because of the risk of side effects. At this time we'll refer to neurology Dr. Brett Fairy who did see him in the past for sleep apnea evaluation for help and advice about medication for his headaches .    Plan repeat labs chemistry creatinine and lipid panel in 3-4 weeks without office visit Otherwise CPX in 3-6 months. Treating empirically for sinusitis today based on scenario Chest pain seems atypical for serious cardiovascular cause based on ED visit and evaluation. Will follow. Consider cardiology follow-up if more typical or progressive. Update  Care teams -Patient advised to return or notify health care team  if  new concerns arise.  Patient Instructions    Treating for  sinuitis  Want you no avoid taking indocin if possible and will get  Referral  Consult from neuro  Dr Beacher May about the headache management .   Will advise a fu blood test creatinine  BMP  With lipid panel in   3-4 weeks   To ensure stable .   Stay hydrated .  BP Readings from Last 3 Encounters:  06/03/17 118/74  05/28/17 (!) 157/99  02/27/17 130/80   Plan ROV          Standley Brooking. Yarelie Hams M.D.

## 2017-06-02 ENCOUNTER — Encounter (HOSPITAL_COMMUNITY): Payer: Self-pay

## 2017-06-03 ENCOUNTER — Ambulatory Visit (INDEPENDENT_AMBULATORY_CARE_PROVIDER_SITE_OTHER): Payer: Federal, State, Local not specified - PPO | Admitting: Internal Medicine

## 2017-06-03 ENCOUNTER — Encounter: Payer: Self-pay | Admitting: Internal Medicine

## 2017-06-03 VITALS — BP 118/74 | HR 104 | Temp 98.0°F | Wt 176.6 lb

## 2017-06-03 DIAGNOSIS — R7989 Other specified abnormal findings of blood chemistry: Secondary | ICD-10-CM | POA: Diagnosis not present

## 2017-06-03 DIAGNOSIS — Z79899 Other long term (current) drug therapy: Secondary | ICD-10-CM

## 2017-06-03 DIAGNOSIS — E785 Hyperlipidemia, unspecified: Secondary | ICD-10-CM

## 2017-06-03 DIAGNOSIS — R079 Chest pain, unspecified: Secondary | ICD-10-CM | POA: Diagnosis not present

## 2017-06-03 DIAGNOSIS — R51 Headache: Secondary | ICD-10-CM | POA: Diagnosis not present

## 2017-06-03 DIAGNOSIS — Z8669 Personal history of other diseases of the nervous system and sense organs: Secondary | ICD-10-CM | POA: Diagnosis not present

## 2017-06-03 DIAGNOSIS — F419 Anxiety disorder, unspecified: Secondary | ICD-10-CM | POA: Diagnosis not present

## 2017-06-03 DIAGNOSIS — Z9884 Bariatric surgery status: Secondary | ICD-10-CM | POA: Diagnosis not present

## 2017-06-03 DIAGNOSIS — J320 Chronic maxillary sinusitis: Secondary | ICD-10-CM

## 2017-06-03 DIAGNOSIS — R519 Headache, unspecified: Secondary | ICD-10-CM

## 2017-06-03 MED ORDER — AMOXICILLIN 500 MG PO CAPS: 500.0000 mg | ORAL_CAPSULE | Freq: Three times a day (TID) | ORAL | 0 refills | Status: DC

## 2017-06-03 NOTE — Patient Instructions (Addendum)
  Treating for  sinuitis  Want you no avoid taking indocin if possible and will get  Referral  Consult from neuro  Dr Beacher May about the headache management .   Will advise a fu blood test creatinine  BMP  With lipid panel in   3-4 weeks   To ensure stable .   Stay hydrated .  BP Readings from Last 3 Encounters:  06/03/17 118/74  05/28/17 (!) 157/99  02/27/17 130/80   Plan ROV

## 2017-06-05 ENCOUNTER — Encounter: Payer: Self-pay | Admitting: Internal Medicine

## 2017-06-10 DIAGNOSIS — M5416 Radiculopathy, lumbar region: Secondary | ICD-10-CM | POA: Diagnosis not present

## 2017-06-10 DIAGNOSIS — M4326 Fusion of spine, lumbar region: Secondary | ICD-10-CM | POA: Diagnosis not present

## 2017-08-22 ENCOUNTER — Other Ambulatory Visit: Payer: Self-pay | Admitting: Internal Medicine

## 2017-09-30 ENCOUNTER — Encounter: Payer: Self-pay | Admitting: Podiatry

## 2017-09-30 ENCOUNTER — Ambulatory Visit (INDEPENDENT_AMBULATORY_CARE_PROVIDER_SITE_OTHER): Payer: Federal, State, Local not specified - PPO

## 2017-09-30 ENCOUNTER — Ambulatory Visit: Payer: Federal, State, Local not specified - PPO | Admitting: Podiatry

## 2017-09-30 VITALS — BP 152/82 | HR 74 | Temp 99.0°F

## 2017-09-30 DIAGNOSIS — M778 Other enthesopathies, not elsewhere classified: Secondary | ICD-10-CM

## 2017-09-30 DIAGNOSIS — M1000 Idiopathic gout, unspecified site: Secondary | ICD-10-CM

## 2017-09-30 DIAGNOSIS — M79672 Pain in left foot: Secondary | ICD-10-CM

## 2017-09-30 DIAGNOSIS — M7752 Other enthesopathy of left foot: Secondary | ICD-10-CM

## 2017-09-30 DIAGNOSIS — M779 Enthesopathy, unspecified: Secondary | ICD-10-CM

## 2017-09-30 MED ORDER — TRIAMCINOLONE ACETONIDE 10 MG/ML IJ SUSP
10.0000 mg | Freq: Once | INTRAMUSCULAR | Status: AC
  Administered 2017-09-30: 10 mg

## 2017-09-30 NOTE — Patient Instructions (Signed)

## 2017-10-01 NOTE — Progress Notes (Signed)
Subjective:   Patient ID: Hector [name], male   DOB: 57 y.o.   MRN: [id number]   HPI Patient presents stating he developed a lot of inflammation around the big toe joint of the left foot and was concerned about DVT that he has some swelling in his legs if he walks a lot   ROS      Objective:  Physical Exam  Neurovascular status intact with exquisite discomfort with redness around the first MPJ left with negative Homans sign noted and no increased warmth currently in the posterior calf with no edema     Assessment:  Probability that this is an acute gout attack which made him walk differently causing strain of his calf versus possibility for DVT left.     Plan:  Discussed both conditions and today I did inject around the first MPJ left 3 mg Kenalog 5 mg Xylocaine and offered an ultrasound Doppler for him.  He denies wanting it and is going to just watch his symptoms of any changes should occur he will contact us immediately or go to the emergency room or if he should develop any shortness of breath or swelling of his leg.  Reappoint as needed  X-rays indicate there is structural deformity but no indications of ostial lysis or advanced arthritis of the first MPJ

## 2017-10-05 NOTE — Progress Notes (Deleted)
No chief complaint on file.   HPI: Hector [name] 57 y.o. come in for  ROS: See pertinent positives and negatives per HPI.  Past Medical History:  Diagnosis Date  . Abnormal LFTs    improved after gastic bypass  . ADJ DISORDER WITH MIXED ANXIETY & DEPRESSED MOOD 08/23/2009   Qualifier: Diagnosis of  By: [name] MD, Standley Brooking Now on lamictal and lexapro and elevil for sleep.   . Anginal pain ([place])   . Anxiety   . B12 nutritional deficiency   . Chest pain    hopsitalized 2 14 evaluation  . Closed head injury    laceration  9 12  no loc  vision change  . DDD (degenerative disc disease)   . Diabetes mellitus    resolved with gastric bypass  . DIABETES MELLITUS, TYPE II 04/06/2007   Qualifier: Diagnosis of  By: [name] MD, Standley Brooking Improved and off meds since gastric bypass   . GERD (gastroesophageal reflux disease)    no meds  . Gout   . Headache    once per week uses Indomethicin hx of cluster headaches   . History of bunionectomy of left great toe   . History of concussion   . History of renal stone    Seen in emergency room not urologist  . Hyperlipidemia   . Hypertension 02/27/2017  . OBESITY 12/10/2007   Qualifier: Diagnosis of  By: [name] MD, Standley Brooking Gastric bypass   . OBSTRUCTIVE SLEEP APNEA 07/22/2007   Qualifier: Diagnosis of  By: [name] RN, Doroteo Bradford    . OSA (obstructive sleep apnea) 12/12/2013  . Pancreatitis 03/21/2016   went to [place]  . Perforated ulcer ([place])    Surgical repair9/24 2011  . Sleep apnea    had gastric bypass/ sa-gone  . 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 Uterine cancer Mother   . Hypertension Mother   . Cervical cancer Mother   . Heart disease Father   . COPD Father   . Colon cancer Maternal Uncle   . Colon cancer Paternal Aunt     Social History   Socioeconomic History  . Marital status: Married    Spouse name: Hector [name]  . Number of children: 2  . Years of education: Associates   . Highest education level: Not on file  Social Needs  . Financial resource strain: Not on file  . Food insecurity - worry: Not on file  . Food insecurity - inability: Not on file  . Transportation needs - medical: Not on file  . Transportation needs - non-medical: Not on file  Occupational History  . Occupation: Administrator    Employer: [employer]  Tobacco Use  . Smoking status: Never Smoker  . Smokeless tobacco: Never Used  Substance and Sexual Activity  . Alcohol use: Yes    Comment: occas liquor   . Drug use: No  . Sexual activity: Yes  Other Topics Concern  . 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   Patient has two children.   Patient has a college education.   Regular exercise-no   Employed Event organiser and investigation   school travels       10 hours work     Sleep  No longer needs  cpap   Patient is right-handed.   Patient drinks one soda  daily.   trainiing for triathalon       Outpatient Medications Prior to Visit  Medication Sig Dispense Refill  . acetaminophen (TYLENOL ARTHRITIS PAIN) 650 MG CR tablet Take 650 mg by mouth every 8 (eight) hours as needed for pain.    [name] amitriptyline (ELAVIL) 50 MG tablet TAKE 1 TABLET AS NEEDED FOR SLEEP 30 tablet 0  . amoxicillin (AMOXIL) 500 MG capsule Take 1 capsule (500 mg total) by mouth 3 (three) times daily. 21 capsule 0  . Cholecalciferol (VITAMIN D-1000 MAX ST) 1000 units tablet Take 1,000 Units by mouth daily.    [name] escitalopram (LEXAPRO) 20 MG tablet Take 20 mg by mouth every morning.     . indomethacin (INDOCIN) 50 MG capsule TAKE 1 CAPSULE BY MOUTH 3 TIMES A DAY AS NEEDED FOR HEADACHE. LIMIT REGULAR USE. 40 capsule 0  . lamoTRIgine (LAMICTAL) 100 MG tablet Take 100-200 mg by mouth 2 (two) times daily. Take 200 mg in the am and 100 mg qhs.    . lisinopril (PRINIVIL,ZESTRIL) 5 MG tablet Take 1 tablet (5 mg total) by mouth daily. DUE FOR LABS!  90 tablet 0  . methocarbamol (ROBAXIN) 750 MG tablet Take 1 tablet (750 mg total) by mouth every 6 (six) hours as needed for muscle spasms. 40 tablet 0  . Multiple Vitamin (MULTIVITAMIN) tablet Take 1 tablet by mouth daily.    [name] oxyCODONE (OXY IR/ROXICODONE) 5 MG immediate release tablet Take 5 mg by mouth every 6 (six) hours as needed for pain.  0  . predniSONE (DELTASONE) 50 MG tablet Take 1 tablet (50 mg total) by mouth daily with breakfast. 5 tablet 0  . traMADol (ULTRAM) 50 MG tablet Take 1 tablet (50 mg total) by mouth every 6 (six) hours as needed for severe pain. 15 tablet 0  . traZODone (DESYREL) 100 MG tablet Take 300 mg by mouth at bedtime.     . vitamin B-12 (CYANOCOBALAMIN) 1000 MCG tablet Take 1,000 mcg by mouth daily.     No facility-administered medications prior to visit.      EXAM:  There were no vitals taken for this visit.  There is no height or weight on file to calculate BMI.  GENERAL: vitals reviewed and listed above, alert, oriented, appears well hydrated and in no acute distress HEENT: atraumatic, conjunctiva  clear, no obvious abnormalities on inspection of external nose and ears OP : no lesion edema or exudate  NECK: no obvious masses on inspection palpation  LUNGS: clear to auscultation bilaterally, no wheezes, rales or rhonchi, good air movement CV: HRRR, no clubbing cyanosis or  peripheral edema nl cap refill  MS: moves all extremities without noticeable focal  abnormality PSYCH: pleasant and cooperative, no obvious depression or anxiety Lab Results  Component Value Date   WBC 3.8 (L) 05/28/2017   HGB 15.1 05/28/2017   HCT 44.3 05/28/2017   PLT 137 (L) 05/28/2017   GLUCOSE 167 (H) 05/28/2017   CHOL 101 03/20/2016   TRIG 120 03/20/2016   HDL 34 (L) 03/20/2016   LDLDIRECT 113.2 04/01/2007   LDLCALC 43 03/20/2016   ALT 9 (L) 11/24/2016   AST 24 11/24/2016   NA 141 05/28/2017   K 3.7 05/28/2017   CL 104 05/28/2017   CREATININE 1.40 (H) 05/28/2017     BUN 7 05/28/2017   CO2 29 05/28/2017   TSH 0.93 01/07/2016   PSA 0.76 04/09/2015   INR 1.07 03/20/2016   HGBA1C 5.1 11/24/2016   MICROALBUR 0.6  05/15/2014   BP Readings from Last 3 Encounters:  09/30/17 (!) 152/82  06/03/17 118/74  05/28/17 (!) 157/99   Wt Readings from Last 3 Encounters:  06/03/17 176 lb 9.6 oz (80.1 kg)  05/28/17 170 lb (77.1 kg)  02/27/17 174 lb 9.6 oz (79.2 kg)     ASSESSMENT AND PLAN:  Discussed the following assessment and plan:  No diagnosis found.  -Patient advised to return or notify health care team  if  new concerns arise.  There are no Patient Instructions on file for this visit.   Standley Brooking. Keagan Brislin M.D.

## 2017-10-07 ENCOUNTER — Ambulatory Visit: Payer: Federal, State, Local not specified - PPO | Admitting: Internal Medicine

## 2017-10-07 ENCOUNTER — Telehealth: Payer: Self-pay | Admitting: Internal Medicine

## 2017-10-07 DIAGNOSIS — R350 Frequency of micturition: Secondary | ICD-10-CM

## 2017-10-07 NOTE — Telephone Encounter (Signed)
Im ok with referral without a visit  For the ongoing frequency

## 2017-10-07 NOTE — Telephone Encounter (Signed)
Requesting a Urology referral for ongoing Urinary Frequency, Dr John Giovanni.  Patient had an appt this morning at 945 with Dr [name] and was asked to reschedule to d/t arriving late to his scheduled appt. Pt was advised that he could wait until Dr [name] could work him in later this morning, no guarantee of how long the wait would be. Urine was offered to be collected and he could reschedule for 10/08/17 with Dr [name]. Pt refused to left.  Please advise Dr [name], thanks.

## 2017-10-07 NOTE — Telephone Encounter (Signed)
CRM for notification. See Telephone encounter for:   10/07/17.   Caller name: Mrs. Bartell Relation to pt: spouse  Call back number:  [phone]   Reason for call:  Spouse requesting urologist referral to Dr. John Giovanni, MD due to urinating frequently and urgency, please advise

## 2017-10-09 ENCOUNTER — Encounter: Payer: Self-pay | Admitting: Internal Medicine

## 2017-10-09 NOTE — Telephone Encounter (Signed)
Orders placed as directed. Patient's wife notified and instructed to follow-up if she has not heard anything back in 1 week.

## 2017-10-09 NOTE — Telephone Encounter (Signed)
Patient came in to office at 10:02am for a 9:45am appointment. There was no CRM in chart indicating anyone had called to advise that pt would be late or that he had gone to the wrong office. The front desk did attempt to ask provider about working pt in by communicating with [place] ([place]), but this was declined by provider [name]) as she was already 100%. Pt was asked about rescheduling and voiced "this is ridiculous" and "I will find another PCP", he then stormed out of the office. All protocol was followed by front office staff.

## 2017-10-19 NOTE — Telephone Encounter (Signed)
Apologies  For what seems like lots of communication errors.  I advised  Staff to initiate  a referral to urology of your choice .  based on the information  I received about your difficulty.   There is now a centralized call center . So we dont get the messages right away .    We at [facility]  never got the message about being late. I will send this information to supervisors .   In regard to   appt  times : Once the appt  allotted time Is past or nearly over we  see the people who arrived early or on time for  Their appt first . But I   try  to  work people in to the schedule.and see them that day  However  cannot guarantee a specific time  depending on openings in schedule that day .   The  [employer] showed your arrival  at 10:02 for the 9 45 appt.   Urinalysis was offered assuming that we could help  expedite  Evaluation based on information given  .  I did not know that you wanted referral to a urologist until later in the day  And I was unaware that you went to another site .    I can certainly understand  your frustration  all around . I'm glad you sent your concerns in a my chart message  for the sake of  accurate communication.    Hopefully you have an appt with  Urology as the referral was placed and their office is supposed to contact you directly about an appt. time.    I was out of the office last week

## 2017-10-19 NOTE — Telephone Encounter (Signed)
Will send to Dr Panosh as FYI 

## 2017-10-20 NOTE — Telephone Encounter (Signed)
Patient did call to notify office he was running late. See resolved CRMs. Rachel Bo, can you have [facility] pull the recording to see how/if the patient was advised of the late policy? Thanks.

## 2017-10-20 NOTE — Telephone Encounter (Signed)
Will send to Hoyle Sauer to help research issue with call center and communication of late arrival to his previous scheduled appointment.

## 2017-10-21 DIAGNOSIS — F4310 Post-traumatic stress disorder, unspecified: Secondary | ICD-10-CM | POA: Diagnosis not present

## 2017-10-21 DIAGNOSIS — F3132 Bipolar disorder, current episode depressed, moderate: Secondary | ICD-10-CM | POA: Diagnosis not present

## 2017-10-26 DIAGNOSIS — K080 Exfoliation of teeth due to systemic causes: Secondary | ICD-10-CM | POA: Diagnosis not present

## 2017-10-29 ENCOUNTER — Encounter: Payer: Self-pay | Admitting: Urology

## 2017-10-29 ENCOUNTER — Ambulatory Visit
Admission: RE | Admit: 2017-10-29 | Discharge: 2017-10-29 | Disposition: A | Discharge status: Home / Self Care | Payer: Federal, State, Local not specified - PPO | Source: Ambulatory Visit | Attending: Urology | Admitting: Urology

## 2017-10-29 ENCOUNTER — Ambulatory Visit: Payer: Federal, State, Local not specified - PPO | Admitting: Urology

## 2017-10-29 VITALS — BP 157/93 | HR 81 | Ht 70.0 in | Wt 186.6 lb

## 2017-10-29 DIAGNOSIS — R3915 Urgency of urination: Secondary | ICD-10-CM | POA: Diagnosis not present

## 2017-10-29 DIAGNOSIS — Z87442 Personal history of urinary calculi: Secondary | ICD-10-CM | POA: Diagnosis not present

## 2017-10-29 DIAGNOSIS — R350 Frequency of micturition: Secondary | ICD-10-CM

## 2017-10-29 LAB — URINALYSIS, COMPLETE
BILIRUBIN UA: NEGATIVE
GLUCOSE, UA: NEGATIVE
Ketones, UA: NEGATIVE
LEUKOCYTES UA: NEGATIVE
Nitrite, UA: NEGATIVE
PROTEIN UA: NEGATIVE
RBC UA: NEGATIVE
Specific Gravity, UA: 1.025 (ref 1.005–1.030)
UUROB: 0.2 mg/dL (ref 0.2–1.0)
pH, UA: 5.5 (ref 5.0–7.5)

## 2017-10-29 LAB — MICROSCOPIC EXAMINATION
Bacteria, UA: NONE SEEN
RBC, UA: NONE SEEN /hpf (ref 0–?)

## 2017-10-29 LAB — BLADDER SCAN AMB NON-IMAGING

## 2017-10-29 MED ORDER — TAMSULOSIN HCL 0.4 MG PO CAPS: 0.4000 mg | ORAL_CAPSULE | Freq: Every day | ORAL | 1 refills | Status: DC

## 2017-10-29 NOTE — Progress Notes (Signed)
10/29/2017 10:06 AM   [name] [birth year] [id number]  Referring provider: Burnis Medin, MD [place], [place] [address]  Chief Complaint  Patient presents with  . Urinary Frequency    Pt states that this has been going on for appox 2 months, has recently gotten better    HPI: Hector [name] is a 57 year old male seen for evaluation of urinary urgency.  He states approximately 2 months ago he had onset of significant urinary urgency with rare episodes of urge incontinence.  He also had frequency but denied dysuria or gross hematuria.  He had no flank, abdominal, pelvic or scrotal pain.  He also had nocturia x2.  Over the last 3 weeks he states his symptoms have significantly improved.  He presently has frequency however he attributes this to significant water intake.  He has nocturia x1.  His I PSS completed today was 6/35 with a qOL rated 1/6.  Past urologic history remarkable for stone disease status post PCNL several years ago.   PMH: Past Medical History:  Diagnosis Date  . Abnormal LFTs    improved after gastic bypass  . ADJ DISORDER WITH MIXED ANXIETY & DEPRESSED MOOD 08/23/2009   Qualifier: Diagnosis of  By: [name] MD, Standley Brooking Now on lamictal and lexapro and elevil for sleep.   . Anginal pain ([place])   . Anxiety   . B12 nutritional deficiency   . Chest pain    hopsitalized 2 14 evaluation  . Closed head injury    laceration  9 12  no loc  vision change  . DDD (degenerative disc disease)   . Diabetes mellitus    resolved with gastric bypass  . DIABETES MELLITUS, TYPE II 04/06/2007   Qualifier: Diagnosis of  By: [name] MD, Standley Brooking Improved and off meds since gastric bypass   . GERD (gastroesophageal reflux disease)    no meds  . Gout   . Headache    once per week uses Indomethicin hx of cluster headaches   . History of bunionectomy of left great toe   . History of concussion   . History of renal stone    Seen in emergency room not urologist  .  Hyperlipidemia   . Hypertension 02/27/2017  . OBESITY 12/10/2007   Qualifier: Diagnosis of  By: [name] MD, Standley Brooking Gastric bypass   . OBSTRUCTIVE SLEEP APNEA 07/22/2007   Qualifier: Diagnosis of  By: [name] RN, Doroteo Bradford    . OSA (obstructive sleep apnea) 12/12/2013  . Pancreatitis 03/21/2016   went to [place]  . Perforated ulcer ([place])    Surgical repair9/24 2011  . Sleep apnea    had gastric bypass/ sa-gone  . 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 BACK SURGERY  2012   Dr. Dorina Hoyer disk  . CARPAL TUNNEL RELEASE  2008   left-ulnar nerve decomp  . carpel tunnel Left   . CHOLECYSTECTOMY N/A 03/21/2016   Procedure: LAPAROSCOPIC CHOLECYSTECTOMY WITH INTRAOPERATIVE CHOLANGIOGRAM;  Surgeon: Ralene Ok, MD;  Location: [facility];  Service: General;  Laterality: N/A;  . colonscopy     . EPIDURAL BLOCK INJECTION     by Dr. Nelva Bush  . FOOT SURGERY     bilat for removal of extra bone   . GASTRIC BYPASS  11/06/09   beginning weight 267  . HARDWARE REMOVAL N/A 05/02/2016   Procedure: Removal of hardware Lumbar five-Sacral one with Metrex;  Surgeon: Kristeen Miss, MD;  Location: [facility];  Service: Neurosurgery;  Laterality: N/A;  Removal of hardware L5-S1 with Metrex  . HERNIA REPAIR  1610   umbilical  . HIATAL HERNIA REPAIR N/A 01/20/2013   Procedure: LAPAROSCOPIC REPAIR OF INTERNAL HERNIA;  Surgeon: Shann Medal, MD;  Location: [facility];  Service: General;  Laterality: N/A;  . LUMBAR LAMINECTOMY/DECOMPRESSION MICRODISCECTOMY Left 06/07/2014   Procedure: MICRO LUMBAR DECOMPRESSION L5 - S1 ON THE LEFT/L5 FORAMINOTOMY/EXCISION SYNOVIAL CYST  1 LEVEL;  Surgeon: [name], MD;  Location: [facility];  Service: Orthopedics;  Laterality: Left;  [name] MASS EXCISION Right 04/18/2013   Procedure: RIGHT SMALL FINGER SKIN LESION EXCISION;  Surgeon: Jolyn Nap, MD;  Location: [facility];  Service: Orthopedics;   Laterality: Right;  . NASAL SEPTOPLASTY W/ TURBINOPLASTY     x 2  . NEPHROLITHOTOMY Right 11/03/2014   Procedure: RIGHT PERCUTANEOUS NEPHROLITHOTOMY;  Surgeon: Claybon Jabs, MD;  Location: [facility];  Service: Urology;  Laterality: Right;  . SHOULDER ARTHROSCOPY  2014   rt  . surgerical repair of stomach ulcer  06/08/10   per  . TONSILLECTOMY    . UVULOPALATOPHARYNGOPLASTY  2007   with tonsils  . VASECTOMY      Home Medications:  Allergies as of 10/29/2017      Reactions   Aspirin Other (See Comments)   perforated ulcer   Cymbalta [duloxetine Hcl] Other (See Comments)   Over-sedating; slept over 24 hours with one dose.  Possible serotonin syndrome.   Tape Rash   Surgical tape   Nsaids Other (See Comments)   ulcer Other reaction(s): Other (See Comments) Perforated ulcer   Hydrocodone Other (See Comments)   headache   Vancomycin Rash   Red man syndrome.  Please premedicate.      Medication List        Accurate as of 10/29/17 10:06 AM. Always use your most recent med list.          escitalopram 20 MG tablet Commonly known as:  LEXAPRO Take 20 mg by mouth every morning.   lamoTRIgine 100 MG tablet Commonly known as:  LAMICTAL Take 100-200 mg by mouth 2 (two) times daily. Take 200 mg in the am and 100 mg qhs.   lisinopril 5 MG tablet Commonly known as:  PRINIVIL,ZESTRIL Take 1 tablet (5 mg total) by mouth daily. DUE FOR LABS!   multivitamin tablet Take 1 tablet by mouth daily.   traMADol 50 MG tablet Commonly known as:  ULTRAM Take 1 tablet (50 mg total) by mouth every 6 (six) hours as needed for severe pain.   traZODone 100 MG tablet Commonly known as:  DESYREL Take 300 mg by mouth at bedtime.   TYLENOL ARTHRITIS PAIN 650 MG CR tablet Generic drug:  acetaminophen Take 650 mg by mouth every 8 (eight) hours as needed for pain.   vitamin B-12 1000 MCG tablet Commonly known as:  CYANOCOBALAMIN Take 1,000 mcg by mouth daily.   VITAMIN D-1000 MAX ST 1000  units tablet Generic drug:  Cholecalciferol Take 1,000 Units by mouth daily.       Allergies:  Allergies  Allergen Reactions  . Aspirin Other (See Comments)    perforated ulcer  . Cymbalta [Duloxetine Hcl] Other (See Comments)    Over-sedating; slept over 24 hours with one dose.  Possible serotonin syndrome.  . Tape Rash    Surgical tape  . Nsaids Other (See Comments)    ulcer Other  reaction(s): Other (See Comments) Perforated ulcer  . Hydrocodone Other (See Comments)    headache  . Vancomycin Rash    Red man syndrome.  Please premedicate.    Family History: Family History  Problem Relation Age of Onset  . Uterine cancer Mother   . Hypertension Mother   . Cervical cancer Mother   . Heart disease Father   . COPD Father   . Colon cancer Maternal Uncle   . Colon cancer Paternal Aunt     Social History:  reports that  has never smoked. he has never used smokeless tobacco. He reports that he drinks alcohol. He reports that he does not use drugs.  ROS: UROLOGY Frequent Urination?: Yes Hard to postpone urination?: Yes Burning/pain with urination?: No Get up at night to urinate?: Yes Leakage of urine?: Yes Urine stream starts and stops?: No Trouble starting stream?: No Do you have to strain to urinate?: No Blood in urine?: No Urinary tract infection?: No Sexually transmitted disease?: No Injury to kidneys or bladder?: No Painful intercourse?: No Weak stream?: No Erection problems?: No Penile pain?: No  Gastrointestinal Nausea?: No Vomiting?: No Indigestion/heartburn?: No Diarrhea?: Yes Constipation?: No  Constitutional Fever: No Night sweats?: No Weight loss?: No Fatigue?: No  Skin Skin rash/lesions?: No Itching?: No  Eyes Blurred vision?: No Double vision?: No  Ears/Nose/Throat Sore throat?: No Sinus problems?: Yes  Hematologic/Lymphatic Swollen glands?: No Easy bruising?: No  Cardiovascular Leg swelling?: No Chest pain?:  No  Respiratory Cough?: No Shortness of breath?: No  Endocrine Excessive thirst?: Yes  Musculoskeletal Back pain?: Yes Joint pain?: Yes  Neurological Headaches?: No Dizziness?: No  Psychologic Depression?: No Anxiety?: Yes  Physical Exam: BP (!) 157/93 (BP Location: Left Arm, Patient Position: Sitting, Cuff Size: Normal)   Pulse 81   Ht 5\' 10"  (1.778 m)   Wt 186 lb 9.6 oz (84.6 kg)   SpO2 97%   BMI 26.77 kg/m   Constitutional:  Alert and oriented, No acute distress. HEENT:  AT, moist mucus membranes.  Trachea midline, no masses. Cardiovascular: No clubbing, cyanosis, or edema. Respiratory: Normal respiratory effort, no increased work of breathing. GI: Abdomen is soft, nontender, nondistended, no abdominal masses GU: No CVA tenderness.  Prostate 30 g, smooth without nodules Skin: No rashes, bruises or suspicious lesions. Lymph: No cervical or inguinal adenopathy. Neurologic: Grossly intact, no focal deficits, moving all 4 extremities. Psychiatric: Normal mood and affect.  Laboratory Data: Lab Results  Component Value Date   WBC 3.8 (L) 05/28/2017   HGB 15.1 05/28/2017   HCT 44.3 05/28/2017   MCV 97.8 05/28/2017   PLT 137 (L) 05/28/2017    Lab Results  Component Value Date   CREATININE 1.40 (H) 05/28/2017     Lab Results  Component Value Date   HGBA1C 5.1 11/24/2016    Urinalysis Dipstick/microscopy negative    Assessment & Plan:   57 year old male with a history of urinary urgency which has significantly improved.  We discussed potential causes including noninfectious prostatic inflammation and a distal ureteral calculus with his prior history of stone disease.  Urinalysis today was clear.  There was no significant PVR by bladder scan.  We will send an Rx for tamsulosin to try if he has recurrent symptoms.  A KUB was ordered and he will be notified with the results.  1. Urinary frequency  - Urinalysis, Complete - Bladder Scan (Post Void  Residual) in office  2. Personal history of kidney stones  - Abdomen 1  view (KUB); Future    Abbie Sons, [place] [address], [place], [place] [address] [phone]

## 2017-11-02 ENCOUNTER — Telehealth: Payer: Self-pay

## 2017-11-02 NOTE — Telephone Encounter (Signed)
-----   Message from Abbie Sons, MD sent at 10/30/2017  1:04 PM EST ----- KUB showed no evidence of distal ureteral calculi that would account for his prior symptoms.

## 2017-11-02 NOTE — Telephone Encounter (Signed)
Letter sent.

## 2017-11-03 ENCOUNTER — Ambulatory Visit (INDEPENDENT_AMBULATORY_CARE_PROVIDER_SITE_OTHER): Payer: Federal, State, Local not specified - PPO | Admitting: Internal Medicine

## 2017-11-03 ENCOUNTER — Encounter: Payer: Self-pay | Admitting: Internal Medicine

## 2017-11-03 VITALS — BP 162/96 | HR 72 | Temp 98.1°F | Ht 70.5 in | Wt 189.1 lb

## 2017-11-03 DIAGNOSIS — E119 Type 2 diabetes mellitus without complications: Secondary | ICD-10-CM | POA: Diagnosis not present

## 2017-11-03 DIAGNOSIS — Z79899 Other long term (current) drug therapy: Secondary | ICD-10-CM | POA: Diagnosis not present

## 2017-11-03 DIAGNOSIS — Z0184 Encounter for antibody response examination: Secondary | ICD-10-CM

## 2017-11-03 DIAGNOSIS — Z0000 Encounter for general adult medical examination without abnormal findings: Secondary | ICD-10-CM

## 2017-11-03 DIAGNOSIS — I10 Essential (primary) hypertension: Secondary | ICD-10-CM

## 2017-11-03 DIAGNOSIS — Z125 Encounter for screening for malignant neoplasm of prostate: Secondary | ICD-10-CM

## 2017-11-03 NOTE — Patient Instructions (Addendum)
Wt Readings from Last 3 Encounters:  10/29/17 186 lb 9.6 oz (84.6 kg)  06/03/17 176 lb 9.6 oz (80.1 kg)  05/28/17 170 lb (77.1 kg)   Will put in orders for future  Orders for fasting at [facility].     Would do  Hepatitis A/B   Series .    Do TB testing .  Two  Step.    Your bp is too high at this time.  Restart the lisinopril   Take blood pressure readings twice a day for 7- 10 days and then periodically . Marland KitchenSend in readings    .   After beginning  Medication   And at goal 120/80 is  Goal and below .  BP Readings from Last 3 Encounters:  11/03/17 (!) 162/96  10/29/17 (!) 157/93  09/30/17 (!) 152/82      Health Maintenance, Male A healthy lifestyle and preventive care is important for your health and wellness. Ask your health care provider about what schedule of regular examinations is right for you. What should I know about weight and diet? Eat a Healthy Diet  Eat plenty of vegetables, fruits, whole grains, low-fat dairy products, and lean protein.  Do not eat a lot of foods high in solid fats, added sugars, or salt.  Maintain a Healthy Weight Regular exercise can help you achieve or maintain a healthy weight. You should:  Do at least 150 minutes of exercise each week. The exercise should increase your heart rate and make you sweat (moderate-intensity exercise).  Do strength-training exercises at least twice a week.  Watch Your Levels of Cholesterol and Blood Lipids  Have your blood tested for lipids and cholesterol every 5 years starting at 56 years of age. If you are at high risk for heart disease, you should start having your blood tested when you are 56 years old. You may need to have your cholesterol levels checked more often if: ? Your lipid or cholesterol levels are high. ? You are older than 56 years of age. ? You are at high risk for heart disease.  What should I know about cancer screening? Many types of cancers can be  detected early and may often be prevented. Lung Cancer  You should be screened every year for lung cancer if: ? You are a current smoker who has smoked for at least 30 years. ? You are a former smoker who has quit within the past 15 years.  Talk to your health care provider about your screening options, when you should start screening, and how often you should be screened.  Colorectal Cancer  Routine colorectal cancer screening usually begins at 56 years of age and should be repeated every 5-10 years until you are 56 years old. You may need to be screened more often if early forms of precancerous polyps or small growths are found. Your health care provider may recommend screening at an earlier age if you have risk factors for colon cancer.  Your health care provider may recommend using home test kits to check for hidden blood in the stool.  A small camera at the end of a tube can be used to examine your colon (sigmoidoscopy or colonoscopy). This checks for the earliest forms of colorectal cancer.  Prostate and Testicular Cancer  Depending on your age and overall health, your health care provider may do certain tests to screen for prostate and testicular cancer.  Talk to your  health care provider about any symptoms or concerns you have about testicular or prostate cancer.  Skin Cancer  Check your skin from head to toe regularly.  Tell your health care provider about any new moles or changes in moles, especially if: ? There is a change in a mole's size, shape, or color. ? You have a mole that is larger than a pencil eraser.  Always use sunscreen. Apply sunscreen liberally and repeat throughout the day.  Protect yourself by wearing long sleeves, pants, a wide-brimmed hat, and sunglasses when outside.  What should I know about heart disease, diabetes, and high blood pressure?  If you are 47-31 years of age, have your blood pressure checked every 3-5 years. If you are 48 years of age  or older, have your blood pressure checked every year. You should have your blood pressure measured twice-once when you are at a hospital or clinic, and once when you are not at a hospital or clinic. Record the average of the two measurements. To check your blood pressure when you are not at a hospital or clinic, you can use: ? An automated blood pressure machine at a pharmacy. ? A home blood pressure monitor.  Talk to your health care provider about your target blood pressure.  If you are between 63-43 years old, ask your health care provider if you should take aspirin to prevent heart disease.  Have regular diabetes screenings by checking your fasting blood sugar level. ? If you are at a normal weight and have a low risk for diabetes, have this test once every three years after the age of 31. ? If you are overweight and have a high risk for diabetes, consider being tested at a younger age or more often.  A one-time screening for abdominal aortic aneurysm (AAA) by ultrasound is recommended for men aged 43-75 years who are current or former smokers. What should I know about preventing infection? Hepatitis B If you have a higher risk for hepatitis B, you should be screened for this virus. Talk with your health care provider to find out if you are at risk for hepatitis B infection. Hepatitis C Blood testing is recommended for:  Everyone born from [birth year] through 1964.  Anyone with known risk factors for hepatitis C.  Sexually Transmitted Diseases (STDs)  You should be screened each year for STDs including gonorrhea and chlamydia if: ? You are sexually active and are younger than 56 years of age. ? You are older than 56 years of age and your health care provider tells you that you are at risk for this type of infection. ? Your sexual activity has changed since you were last screened and you are at an increased risk for chlamydia or gonorrhea. Ask your health care provider if you are at  risk.  Talk with your health care provider about whether you are at high risk of being infected with HIV. Your health care provider may recommend a prescription medicine to help prevent HIV infection.  What else can I do?  Schedule regular health, dental, and eye exams.  Stay current with your vaccines (immunizations).  Do not use any tobacco products, such as cigarettes, chewing tobacco, and e-cigarettes. If you need help quitting, ask your health care provider.  Limit alcohol intake to no more than 2 drinks per day. One drink equals 12 ounces of beer, 5 ounces of wine, or 1 ounces of hard liquor.  Do not use street drugs.  Do not share needles.  Ask your health care provider for help if you need support or information about quitting drugs.  Tell your health care provider if you often feel depressed.  Tell your health care provider if you have ever been abused or do not feel safe at home. This information is not intended to replace advice given to you by your health care provider. Make sure you discuss any questions you have with your health care provider. Document Released: 02/28/2008 Document Revised: 04/30/2016 Document Reviewed: 06/05/2015 Elsevier Interactive Patient Education  [employer].

## 2017-11-03 NOTE — Progress Notes (Signed)
Chief Complaint  Patient presents with  . Annual Exam    no new concerns, elevated BP, vaccine questions,    HPI: Patient  Hector [name]  57 y.o. 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  Due for psa screen. Will need update immunizations  Has military records and  Had varicella when younger NO BG CHECK FOR A LONG TIME SIONCE IN REMISSION BP WAS GOOD   Stopped lisinopril  about 2 weeks ago     Health Maintenance  Topic Date Due  . PNEUMOCOCCAL POLYSACCHARIDE VACCINE (1) 09/19/1963  . OPHTHALMOLOGY EXAM  10/16/2010  . HEMOGLOBIN A1C  05/27/2017  . FOOT EXAM  06/03/2018 (Originally 05/13/2016)  . Hepatitis C Screening  06/03/2018 (Originally 1960-10-21)  . TETANUS/TDAP  06/06/2021  . COLONOSCOPY  05/08/2024  . INFLUENZA VACCINE  Completed  . HIV Screening  Completed   Health Maintenance Review LIFESTYLE:  Exercise:  y Tobacco/ETS:n Alcohol: ocas Sugar beverages:n Sleep:better Drug use: no HH of 2 pl;us dogs  Work:retired  Now in school FT    ROS:  Back seems better  Had inj no help  Coping on own  With exrecise  GEN/ HEENT: No fever, significant weight changes sweats headaches vision problems hearing changes, CV/ PULM; No chest pain shortness of breath cough, syncope,edema  change in exercise tolerance. GI /GU: No adominal pain, vomiting, change in bowel habits. No blood in the stool. No significant GU symptoms. SKIN/HEME: ,no acute skin rashes suspicious lesions or bleeding. No lymphadenopathy, nodules, masses.  NEURO/ PSYCH:  No neurologic signs such as weakness numbness. No depression anxiety. IMM/ Allergy: No unusual infections.  Allergy .   REST of 12 system review negative except as per HPI   Past Medical History:  Diagnosis Date  . Abnormal LFTs    improved after gastic  bypass  . ADJ DISORDER WITH MIXED ANXIETY & DEPRESSED MOOD 08/23/2009   Qualifier: Diagnosis of  By: [name] MD, Standley Brooking Now on lamictal and lexapro and elevil for sleep.   . Anginal pain ([place])   . Anxiety   . B12 nutritional deficiency   . Chest pain    hopsitalized 2 14 evaluation  . Closed head injury    laceration  9 12  no loc  vision change  . DDD (degenerative disc disease)   . Diabetes mellitus    resolved with gastric bypass  . DIABETES MELLITUS, TYPE II 04/06/2007   Qualifier: Diagnosis of  By: [name] MD, Standley Brooking Improved and off meds since gastric bypass   . GERD (gastroesophageal reflux disease)    no meds  . Gout   . Headache    once per week uses Indomethicin hx of cluster headaches   . History of bunionectomy of left great toe   . History of concussion   . History of renal stone    Seen in emergency room not urologist  . Hyperlipidemia   . Hypertension 02/27/2017  . OBESITY 12/10/2007   Qualifier: Diagnosis of  By: [name] MD, Standley Brooking Gastric bypass   . OBSTRUCTIVE SLEEP APNEA 07/22/2007   Qualifier: Diagnosis of  By: [name] RN, Doroteo Bradford    . OSA (obstructive sleep apnea) 12/12/2013  . Pancreatitis  03/21/2016   went to [place]  . Perforated ulcer ([place])    Surgical repair9/24 2011  . Sleep apnea    had gastric bypass/ sa-gone  . 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 BACK SURGERY  2012   Dr. Dorina Hoyer disk  . CARPAL TUNNEL RELEASE  2008   left-ulnar nerve decomp  . carpel tunnel Left   . CHOLECYSTECTOMY N/A 03/21/2016   Procedure: LAPAROSCOPIC CHOLECYSTECTOMY WITH INTRAOPERATIVE CHOLANGIOGRAM;  Surgeon: Ralene Ok, MD;  Location: [facility];  Service: General;  Laterality: N/A;  . colonscopy     . EPIDURAL BLOCK INJECTION     by Dr. Nelva Bush  . FOOT SURGERY     bilat for removal of extra bone   . GASTRIC BYPASS  11/06/09   beginning weight 267  . HARDWARE REMOVAL N/A 05/02/2016   Procedure: Removal of  hardware Lumbar five-Sacral one with Metrex;  Surgeon: Kristeen Miss, MD;  Location: [facility];  Service: Neurosurgery;  Laterality: N/A;  Removal of hardware L5-S1 with Metrex  . HERNIA REPAIR  8592   umbilical  . HIATAL HERNIA REPAIR N/A 01/20/2013   Procedure: LAPAROSCOPIC REPAIR OF INTERNAL HERNIA;  Surgeon: Shann Medal, MD;  Location: [facility];  Service: General;  Laterality: N/A;  . LUMBAR LAMINECTOMY/DECOMPRESSION MICRODISCECTOMY Left 06/07/2014   Procedure: MICRO LUMBAR DECOMPRESSION L5 - S1 ON THE LEFT/L5 FORAMINOTOMY/EXCISION SYNOVIAL CYST  1 LEVEL;  Surgeon: [name], MD;  Location: [facility];  Service: Orthopedics;  Laterality: Left;  [name] MASS EXCISION Right 04/18/2013   Procedure: RIGHT SMALL FINGER SKIN LESION EXCISION;  Surgeon: Jolyn Nap, MD;  Location: [facility];  Service: Orthopedics;  Laterality: Right;  . NASAL SEPTOPLASTY W/ TURBINOPLASTY     x 2  . NEPHROLITHOTOMY Right 11/03/2014   Procedure: RIGHT PERCUTANEOUS NEPHROLITHOTOMY;  Surgeon: Claybon Jabs, MD;  Location: [facility];  Service: Urology;  Laterality: Right;  . SHOULDER ARTHROSCOPY  2014   rt  . surgerical repair of stomach ulcer  06/08/10   per  . TONSILLECTOMY    . UVULOPALATOPHARYNGOPLASTY  2007   with tonsils  . VASECTOMY      Family History  Problem Relation Age of Onset  . Uterine cancer Mother   . Hypertension Mother   . Cervical cancer Mother   . Heart disease Father   . COPD Father   . Colon cancer Maternal Uncle   . Colon cancer Paternal Aunt     Social History   Socioeconomic History  . Marital status: Married    Spouse name: [name]  . Number of children: 2  . Years of education: Associates  . Highest education level: None  Social Needs  . Financial resource strain: None  . Food insecurity - worry: None  . Food insecurity - inability: None  . Transportation needs - medical: None  . Transportation needs - non-medical: None  Occupational History  .  Occupation: Administrator    Employer: [employer]  Tobacco Use  . Smoking status: Never Smoker  . Smokeless tobacco: Never Used  Substance and Sexual Activity  . Alcohol use: Yes    Comment: occas liquor   . Drug use: No  . Sexual activity: Yes  Other Topics Concern  . None  Social History Narrative   Patient is married Probation officer) and lives at home with his wife.   Patient has two children.   Patient  has a Financial risk analyst.   Regular exercise-no   Employed Event organiser and investigation   school travels       10 hours work     Sleep  No longer needs  cpap   Patient is right-handed.   Patient drinks one soda daily.   trainiing for triathalon       Outpatient Medications Prior to Visit  Medication Sig Dispense Refill  . acetaminophen (TYLENOL ARTHRITIS PAIN) 650 MG CR tablet Take 650 mg by mouth every 8 (eight) hours as needed for pain.    . Cholecalciferol (VITAMIN D-1000 MAX ST) 1000 units tablet Take 1,000 Units by mouth daily.    [name] escitalopram (LEXAPRO) 20 MG tablet Take 20 mg by mouth every morning.     . lamoTRIgine (LAMICTAL) 100 MG tablet Take 100-200 mg by mouth 2 (two) times daily. Take 200 mg in the am and 100 mg qhs.    . Multiple Vitamin (MULTIVITAMIN) tablet Take 1 tablet by mouth daily.    . tamsulosin (FLOMAX) 0.4 MG CAPS capsule Take 1 capsule (0.4 mg total) by mouth daily. 30 capsule 1  . traZODone (DESYREL) 100 MG tablet Take 300 mg by mouth at bedtime.     . vitamin B-12 (CYANOCOBALAMIN) 1000 MCG tablet Take 1,000 mcg by mouth daily.    [name] lisinopril (PRINIVIL,ZESTRIL) 5 MG tablet Take 1 tablet (5 mg total) by mouth daily. DUE FOR LABS! (Patient not taking: Reported on 10/29/2017) 90 tablet 0  . traMADol (ULTRAM) 50 MG tablet Take 1 tablet (50 mg total) by mouth every 6 (six) hours as needed for severe pain. (Patient not taking: Reported on 11/03/2017) 15 tablet 0   No facility-administered medications prior to visit.      EXAM:  BP (!) 162/96  (BP Location: Right Arm, Patient Position: Sitting, Cuff Size: Normal)   Pulse 72   Temp 98.1 F (36.7 C) (Oral)   Ht 5' 10.5" (1.791 m)   Wt 189 lb 1.6 oz (85.8 kg)   BMI 26.75 kg/m   Body mass index is 26.75 kg/m. Wt Readings from Last 3 Encounters:  11/03/17 189 lb 1.6 oz (85.8 kg)  10/29/17 186 lb 9.6 oz (84.6 kg)  06/03/17 176 lb 9.6 oz (80.1 kg)  weight today was repotted tob done with boots and  emt equipment tools on person  Her repots his weight as about 176   Physical Exam: Vital signs reviewed PVV:ZSMO is a well-developed well-nourished alert cooperative    who appearsr stated age in no acute distress.  HEENT: normocephalic atraumatic , Eyes: PERRL EOM's full, conjunctiva clear, Nares: paten,t no deformity discharge or tenderness., Ears: no deformity EAC's clear TMs with normal landmarks. Mouth: clear OP, no lesions, edema.  Moist mucous membranes. Dentition in adequate repair. NECK: supple without masses, thyromegaly or bruits. CHEST/PULM:  Clear to auscultation and percussion breath sounds equal no wheeze , rales or rhonchi. . CV: PMI is nondisplaced, S1 S2 no gallops, murmurs, rubs. Peripheral pulses are full without delay.No JVD .  ABDOMEN: Bowel sounds normal nontender  No guard or rebound, no hepato splenomegal no CVA tenderness.  No hernia. wel l healed scars  Extremtities:  No clubbing cyanosis or edema, no acute joint swelling or redness no focal atrophy NEURO:  Oriented x3, cranial nerves 3-12 appear to be intact, no obvious focal weakness,gait within normal limits no abnormal reflexes or asymmetrical SKIN: No acute rashes normal turgor, color, no bruising or petechiae. PSYCH: Oriented, good eye contact,  no obvious depression anxiety, cognition and judgment appear normal. LN: no cervical axillary inguinal adenopathy Diabetic Foot Exam - Simple   Simple Foot Form Diabetic Foot exam was performed with the following findings:  Yes 11/03/2017  2:14 PM  Visual  Inspection No deformities, no ulcerations, no other skin breakdown bilaterally:  Yes Sensation Testing Intact to touch and monofilament testing bilaterally:  Yes Pulse Check Posterior Tibialis and Dorsalis pulse intact bilaterally:  Yes Comments      Lab Results  Component Value Date   WBC 3.8 (L) 05/28/2017   HGB 15.1 05/28/2017   HCT 44.3 05/28/2017   PLT 137 (L) 05/28/2017   GLUCOSE 167 (H) 05/28/2017   CHOL 101 03/20/2016   TRIG 120 03/20/2016   HDL 34 (L) 03/20/2016   LDLDIRECT 113.2 04/01/2007   LDLCALC 43 03/20/2016   ALT 9 (L) 11/24/2016   AST 24 11/24/2016   NA 141 05/28/2017   K 3.7 05/28/2017   CL 104 05/28/2017   CREATININE 1.40 (H) 05/28/2017   BUN 7 05/28/2017   CO2 29 05/28/2017   TSH 0.93 01/07/2016   PSA 0.76 04/09/2015   INR 1.07 03/20/2016   HGBA1C 5.1 11/24/2016   MICROALBUR 0.6 05/15/2014    BP Readings from Last 3 Encounters:  11/03/17 (!) 162/96  10/29/17 (!) 157/93  09/30/17 (!) 152/82   Wt Readings from Last 3 Encounters:  11/03/17 189 lb 1.6 oz (85.8 kg)  10/29/17 186 lb 9.6 oz (84.6 kg)  06/03/17 176 lb 9.6 oz (80.1 kg)    Lab results reviewed with patient   ASSESSMENT AND PLAN:  Discussed the following assessment and plan:  Visit for preventive health examination - Plan: Hemoglobin A1c, CBC with Differential/Platelet, Comprehensive metabolic panel, Lipid panel, Varicella zoster antibody, IgG  Medication management - Plan: Hemoglobin A1c, CBC with Differential/Platelet, Comprehensive metabolic panel, Lipid panel, Varicella zoster antibody, IgG  Hypertension, unspecified type - Plan: Hemoglobin A1c, CBC with Differential/Platelet, Comprehensive metabolic panel, Lipid panel  Diabetes mellitus without complication in remission - Plan: Hemoglobin A1c, CBC with Differential/Platelet, Comprehensive metabolic panel, Lipid panel, Microalbumin / creatinine urine ratio  Screening PSA (prostate specific antigen) - Plan: PSA  Immunity  status testing - Plan: Varicella zoster antibody, IgG Reviewed blood pressure control go back on the ACE inhibitor get as readings to make sure in control if not we will adjust medication. Plan is to get fasting blood work at the [facility] lab at his convenience.  He is due for medication and lab monitoring. He will check with the health department about cost of immunizations but it appears he needs a second MMR a two-step PPD test and hepatitis B series which I advised would be hepatitis A B series. We will get varicella immunity with his blood work.  Form completed today. Patient Care Team: Marilea Gwynne, Standley Brooking, MD as PCP - General Susa Day, MD (Orthopedic Surgery) Annia Belt, MD as Consulting Physician (Hematology and Oncology) Milagros Evener, NP as Nurse Practitioner Rana Snare, MD as Consulting Physician (Urology) Patient Instructions    Wt Readings from Last 3 Encounters:  10/29/17 186 lb 9.6 oz (84.6 kg)  06/03/17 176 lb 9.6 oz (80.1 kg)  05/28/17 170 lb (77.1 kg)   Will put in orders for future  Orders for fasting at [facility] office   Suggest  Second MMR   [place].     Would do  Hepatitis A/B   Series .    Do TB testing .  Two  Step.    Your bp is too high at this time.  Restart the lisinopril   Take blood pressure readings twice a day for 7- 10 days and then periodically . Marland KitchenSend in readings    .   After beginning  Medication   And at goal 120/80 is  Goal and below .  BP Readings from Last 3 Encounters:  11/03/17 (!) 162/96  10/29/17 (!) 157/93  09/30/17 (!) 152/82      Health Maintenance, Male A healthy lifestyle and preventive care is important for your health and wellness. Ask your health care provider about what schedule of regular examinations is right for you. What should I know about weight and diet? Eat a Healthy Diet  Eat plenty of vegetables, fruits, whole grains, low-fat dairy products, and lean protein.  Do not eat a lot of foods high in  solid fats, added sugars, or salt.  Maintain a Healthy Weight Regular exercise can help you achieve or maintain a healthy weight. You should:  Do at least 150 minutes of exercise each week. The exercise should increase your heart rate and make you sweat (moderate-intensity exercise).  Do strength-training exercises at least twice a week.  Watch Your Levels of Cholesterol and Blood Lipids  Have your blood tested for lipids and cholesterol every 5 years starting at 56 years of age. If you are at high risk for heart disease, you should start having your blood tested when you are 56 years old. You may need to have your cholesterol levels checked more often if: ? Your lipid or cholesterol levels are high. ? You are older than 56 years of age. ? You are at high risk for heart disease.  What should I know about cancer screening? Many types of cancers can be detected early and may often be prevented. Lung Cancer  You should be screened every year for lung cancer if: ? You are a current smoker who has smoked for at least 30 years. ? You are a former smoker who has quit within the past 15 years.  Talk to your health care provider about your screening options, when you should start screening, and how often you should be screened.  Colorectal Cancer  Routine colorectal cancer screening usually begins at 56 years of age and should be repeated every 5-10 years until you are 56 years old. You may need to be screened more often if early forms of precancerous polyps or small growths are found. Your health care provider may recommend screening at an earlier age if you have risk factors for colon cancer.  Your health care provider may recommend using home test kits to check for hidden blood in the stool.  A small camera at the end of a tube can be used to examine your colon (sigmoidoscopy or colonoscopy). This checks for the earliest forms of colorectal cancer.  Prostate and Testicular  Cancer  Depending on your age and overall health, your health care provider may do certain tests to screen for prostate and testicular cancer.  Talk to your health care provider about any symptoms or concerns you have about testicular or prostate cancer.  Skin Cancer  Check your skin from head to toe regularly.  Tell your health care provider about any new moles or changes in moles, especially if: ? There is a change in a mole's size, shape, or color. ? You have a mole that is larger than a pencil eraser.  Always use sunscreen. Apply sunscreen liberally and  repeat throughout the day.  Protect yourself by wearing long sleeves, pants, a wide-brimmed hat, and sunglasses when outside.  What should I know about heart disease, diabetes, and high blood pressure?  If you are 36-61 years of age, have your blood pressure checked every 3-5 years. If you are 65 years of age or older, have your blood pressure checked every year. You should have your blood pressure measured twice-once when you are at a hospital or clinic, and once when you are not at a hospital or clinic. Record the average of the two measurements. To check your blood pressure when you are not at a hospital or clinic, you can use: ? An automated blood pressure machine at a pharmacy. ? A home blood pressure monitor.  Talk to your health care provider about your target blood pressure.  If you are between 12-21 years old, ask your health care provider if you should take aspirin to prevent heart disease.  Have regular diabetes screenings by checking your fasting blood sugar level. ? If you are at a normal weight and have a low risk for diabetes, have this test once every three years after the age of 20. ? If you are overweight and have a high risk for diabetes, consider being tested at a younger age or more often.  A one-time screening for abdominal aortic aneurysm (AAA) by ultrasound is recommended for men aged 53-75 years who are  current or former smokers. What should I know about preventing infection? Hepatitis B If you have a higher risk for hepatitis B, you should be screened for this virus. Talk with your health care provider to find out if you are at risk for hepatitis B infection. Hepatitis C Blood testing is recommended for:  Everyone born from [birth year] through 1964.  Anyone with known risk factors for hepatitis C.  Sexually Transmitted Diseases (STDs)  You should be screened each year for STDs including gonorrhea and chlamydia if: ? You are sexually active and are younger than 56 years of age. ? You are older than 56 years of age and your health care provider tells you that you are at risk for this type of infection. ? Your sexual activity has changed since you were last screened and you are at an increased risk for chlamydia or gonorrhea. Ask your health care provider if you are at risk.  Talk with your health care provider about whether you are at high risk of being infected with HIV. Your health care provider may recommend a prescription medicine to help prevent HIV infection.  What else can I do?  Schedule regular health, dental, and eye exams.  Stay current with your vaccines (immunizations).  Do not use any tobacco products, such as cigarettes, chewing tobacco, and e-cigarettes. If you need help quitting, ask your health care provider.  Limit alcohol intake to no more than 2 drinks per day. One drink equals 12 ounces of beer, 5 ounces of wine, or 1 ounces of hard liquor.  Do not use street drugs.  Do not share needles.  Ask your health care provider for help if you need support or information about quitting drugs.  Tell your health care provider if you often feel depressed.  Tell your health care provider if you have ever been abused or do not feel safe at home. This information is not intended to replace advice given to you by your health care provider. Make sure you discuss any questions  you have with your health care  provider. Document Released: 02/28/2008 Document Revised: 04/30/2016 Document Reviewed: 06/05/2015 Elsevier Interactive Patient Education  2018 [place]. Travers Goodley M.D.

## 2017-11-21 ENCOUNTER — Other Ambulatory Visit: Payer: Self-pay | Admitting: Internal Medicine

## 2017-12-08 ENCOUNTER — Other Ambulatory Visit: Payer: Self-pay

## 2017-12-24 ENCOUNTER — Encounter: Payer: Self-pay | Admitting: Internal Medicine

## 2017-12-24 NOTE — Telephone Encounter (Signed)
Send in lisinopril 10 mg 1 po qd disp 90  ( increase from 5 mg per day to 10 mg per day)   Send in readings in another  Month    May increase to 20 mg per day if needed

## 2017-12-24 NOTE — Telephone Encounter (Signed)
Dr. [name] please advise.  Notes from 11/03/17 below  Instructions       Wt Readings from Last 3 Encounters:  10/29/17 186 lb 9.6 oz (84.6 kg)  06/03/17 176 lb 9.6 oz (80.1 kg)  05/28/17 170 lb (77.1 kg)   Will put in orders for future  Orders for fasting at [place].     Would do  Hepatitis A/B   Series .    Do TB testing .  Two  Step.    Your bp is too high at this time.  Restart the lisinopril   Take blood pressure readings twice a day for 7- 10 days and then periodically . Marland KitchenSend in readings    .   After beginning  Medication   And at goal 120/80 is  Goal and below .     BP Readings from Last 3 Encounters:  11/03/17 (!) 162/96  10/29/17 (!) 157/93  09/30/17 (!) 152/82

## 2017-12-29 MED ORDER — LISINOPRIL 10 MG PO TABS: 10.0000 mg | ORAL_TABLET | Freq: Every day | ORAL | 0 refills | Status: DC

## 2017-12-29 NOTE — Telephone Encounter (Signed)
rx sent medlist updated Pt notified via mychart. Nothing further needed.

## 2018-01-18 DIAGNOSIS — F3132 Bipolar disorder, current episode depressed, moderate: Secondary | ICD-10-CM | POA: Diagnosis not present

## 2018-01-18 DIAGNOSIS — F4310 Post-traumatic stress disorder, unspecified: Secondary | ICD-10-CM | POA: Diagnosis not present

## 2018-02-04 ENCOUNTER — Ambulatory Visit: Payer: Federal, State, Local not specified - PPO | Admitting: Podiatry

## 2018-02-04 ENCOUNTER — Other Ambulatory Visit: Payer: Self-pay | Admitting: Podiatry

## 2018-02-04 ENCOUNTER — Ambulatory Visit (INDEPENDENT_AMBULATORY_CARE_PROVIDER_SITE_OTHER): Payer: Federal, State, Local not specified - PPO

## 2018-02-04 ENCOUNTER — Encounter: Payer: Self-pay | Admitting: Podiatry

## 2018-02-04 DIAGNOSIS — M779 Enthesopathy, unspecified: Secondary | ICD-10-CM | POA: Diagnosis not present

## 2018-02-04 DIAGNOSIS — M778 Other enthesopathies, not elsewhere classified: Secondary | ICD-10-CM

## 2018-02-04 DIAGNOSIS — M25572 Pain in left ankle and joints of left foot: Secondary | ICD-10-CM

## 2018-02-04 DIAGNOSIS — M7752 Other enthesopathy of left foot: Secondary | ICD-10-CM

## 2018-02-04 DIAGNOSIS — M1000 Idiopathic gout, unspecified site: Secondary | ICD-10-CM

## 2018-02-04 MED ORDER — TRIAMCINOLONE ACETONIDE 10 MG/ML IJ SUSP
10.0000 mg | Freq: Once | INTRAMUSCULAR | Status: AC
  Administered 2018-02-04: 10 mg

## 2018-02-04 MED ORDER — INDOMETHACIN 25 MG PO CAPS: 50.0000 mg | ORAL_CAPSULE | Freq: Three times a day (TID) | ORAL | Status: AC

## 2018-02-04 NOTE — Patient Instructions (Signed)

## 2018-02-04 NOTE — Progress Notes (Signed)
Subjective:   Patient ID: Hector [name], male   DOB: 57 y.o.   MRN: [id number]   HPI Patient presents stating he is developed a lot of pain in his left ankle and he thinks maybe it was a gout attack and he has been on his feet a lot lately neurovascular status intact with inflammation redness around the sinus tarsi left   ROS      Objective:  Physical Exam  And extending slightly plantar to this area with exquisite discomfort upon palpation     Assessment:  Appears to be gout of the left ankle and sinus tarsi     Plan:  H&P x-ray reviewed and today injected the sinus tarsi 3 mg Kenalog pyelogram Xylocaine put patient on indomethacin and discussed allopurinol feet should develop further attacks.  Patient will utilize ice and will be seen back as needed and I educated him on gout and foods to avoid  X-ray indicates that there is no indications of a bony pathological process arthritis or stress fracture

## 2018-02-23 ENCOUNTER — Other Ambulatory Visit (INDEPENDENT_AMBULATORY_CARE_PROVIDER_SITE_OTHER): Payer: Federal, State, Local not specified - PPO

## 2018-02-23 ENCOUNTER — Telehealth: Payer: Self-pay | Admitting: Emergency Medicine

## 2018-02-23 ENCOUNTER — Ambulatory Visit (INDEPENDENT_AMBULATORY_CARE_PROVIDER_SITE_OTHER): Payer: Federal, State, Local not specified - PPO | Admitting: *Deleted

## 2018-02-23 DIAGNOSIS — I10 Essential (primary) hypertension: Secondary | ICD-10-CM

## 2018-02-23 DIAGNOSIS — Z0184 Encounter for antibody response examination: Secondary | ICD-10-CM

## 2018-02-23 DIAGNOSIS — Z23 Encounter for immunization: Secondary | ICD-10-CM | POA: Diagnosis not present

## 2018-02-23 DIAGNOSIS — E119 Type 2 diabetes mellitus without complications: Secondary | ICD-10-CM

## 2018-02-23 DIAGNOSIS — Z125 Encounter for screening for malignant neoplasm of prostate: Secondary | ICD-10-CM | POA: Diagnosis not present

## 2018-02-23 DIAGNOSIS — Z111 Encounter for screening for respiratory tuberculosis: Secondary | ICD-10-CM | POA: Diagnosis not present

## 2018-02-23 DIAGNOSIS — Z0000 Encounter for general adult medical examination without abnormal findings: Secondary | ICD-10-CM | POA: Diagnosis not present

## 2018-02-23 DIAGNOSIS — Z79899 Other long term (current) drug therapy: Secondary | ICD-10-CM | POA: Diagnosis not present

## 2018-02-23 LAB — CBC WITH DIFFERENTIAL/PLATELET
BASOS PCT: 0.5 % (ref 0.0–3.0)
Basophils Absolute: 0 10*3/uL (ref 0.0–0.1)
EOS ABS: 0.1 10*3/uL (ref 0.0–0.7)
Eosinophils Relative: 2.9 % (ref 0.0–5.0)
HEMATOCRIT: 45 % (ref 39.0–52.0)
Hemoglobin: 15.4 g/dL (ref 13.0–17.0)
LYMPHS ABS: 1.3 10*3/uL (ref 0.7–4.0)
LYMPHS PCT: 33.3 % (ref 12.0–46.0)
MCHC: 34.2 g/dL (ref 30.0–36.0)
MCV: 106.1 fl — ABNORMAL HIGH (ref 78.0–100.0)
Monocytes Absolute: 0.3 10*3/uL (ref 0.1–1.0)
Monocytes Relative: 8.3 % (ref 3.0–12.0)
NEUTROS ABS: 2.2 10*3/uL (ref 1.4–7.7)
NEUTROS PCT: 55 % (ref 43.0–77.0)
PLATELETS: 151 10*3/uL (ref 150.0–400.0)
RBC: 4.24 Mil/uL (ref 4.22–5.81)
RDW: 13.7 % (ref 11.5–15.5)
WBC: 4 10*3/uL (ref 4.0–10.5)

## 2018-02-23 LAB — COMPREHENSIVE METABOLIC PANEL
ALT: 26 U/L (ref 0–53)
AST: 34 U/L (ref 0–37)
Albumin: 4.1 g/dL (ref 3.5–5.2)
Alkaline Phosphatase: 82 U/L (ref 39–117)
BUN: 10 mg/dL (ref 6–23)
CALCIUM: 9.2 mg/dL (ref 8.4–10.5)
CO2: 32 meq/L (ref 19–32)
CREATININE: 1.4 mg/dL (ref 0.40–1.50)
Chloride: 103 mEq/L (ref 96–112)
GFR: 55.63 mL/min — ABNORMAL LOW (ref >60.00)
GLUCOSE: 127 mg/dL — AB (ref 70–99)
Potassium: 4.1 mEq/L (ref 3.5–5.1)
SODIUM: 143 meq/L (ref 135–145)
Total Bilirubin: 0.6 mg/dL (ref 0.2–1.2)
Total Protein: 6.3 g/dL (ref 6.0–8.3)

## 2018-02-23 LAB — HEMOGLOBIN A1C: Hgb A1c MFr Bld: 5.7 % (ref 4.6–6.5)

## 2018-02-23 LAB — LIPID PANEL
CHOL/HDL RATIO: 3
Cholesterol: 142 mg/dL (ref 0–200)
HDL: 48.1 mg/dL (ref >39.00)
LDL Cholesterol: 75 mg/dL (ref 0–99)
NONHDL: 94.19
Triglycerides: 94 mg/dL (ref 0.0–149.0)
VLDL: 18.8 mg/dL (ref 0.0–40.0)

## 2018-02-23 LAB — MICROALBUMIN / CREATININE URINE RATIO
CREATININE, U: 193.1 mg/dL
MICROALB UR: 1.1 mg/dL (ref 0.0–1.9)
MICROALB/CREAT RATIO: 0.6 mg/g (ref 0.0–30.0)

## 2018-02-23 LAB — PSA: PSA: 0.81 ng/mL (ref 0.10–4.00)

## 2018-02-23 NOTE — Telephone Encounter (Signed)
Ok with me but someone will have to review what he needs from the form

## 2018-02-23 NOTE — Progress Notes (Signed)
Per orders of Dr. [name], injection of Hepatitis A/B vaccine and TB skin test given by Dorrene German. Unable to give MMR today due to vaccine being out of stock. Patient tolerated injections well.  PPD Placement note [name], 57 y.o. male is here today for placement of PPD test Reason for PPD test: [employer] Pt taken PPD test before: yes Verified in allergy area and with patient that they are not allergic to the products PPD is made of (Phenol or Tween). Yes  O: Alert and oriented in NAD. P:  PPD placed on 02/23/2018.  Patient advised to return for reading within 48-72 hours.  Dorrene German, RN

## 2018-02-23 NOTE — Telephone Encounter (Signed)
Okay to place orders?    Copied from [place] [id number]. Topic: Appointment Scheduling - Scheduling Inquiry for Clinic >> Feb 22, 2018  1:50 PM Scherrie Gerlach wrote: Reason for CRM: pt would like an order for a hep c screening.  Pt thought there is an injection for hep c, advised it was a lab draw.  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

## 2018-02-24 LAB — VARICELLA ZOSTER ANTIBODY, IGG: Varicella IgG: 179.9 index

## 2018-02-26 ENCOUNTER — Encounter: Payer: Self-pay | Admitting: *Deleted

## 2018-02-26 ENCOUNTER — Ambulatory Visit (INDEPENDENT_AMBULATORY_CARE_PROVIDER_SITE_OTHER): Payer: Federal, State, Local not specified - PPO | Admitting: *Deleted

## 2018-02-26 DIAGNOSIS — Z23 Encounter for immunization: Secondary | ICD-10-CM

## 2018-02-26 LAB — TB SKIN TEST
Induration: 0 mm
TB Skin Test: NEGATIVE

## 2018-02-26 NOTE — Progress Notes (Signed)
Per orders of Dr. [name], injection of MMR vaccine given by Dorrene German. Patient tolerated injection well.  PPD Reading Note PPD read and results entered in [place]. Result: 0 mm induration. Interpretation: Negative If test not read within 48-72 hours of initial placement, patient advised to repeat in other arm 1-3 weeks after this test. Allergic reaction: no

## 2018-03-03 ENCOUNTER — Telehealth: Payer: Self-pay | Admitting: Internal Medicine

## 2018-03-03 NOTE — Telephone Encounter (Unsigned)
Copied from [place] [id number]. Topic: General - Call Back - No Documentation >> Mar 03, 2018  3:05 PM Nimmons, Emilio Math, RN wrote: Left a voice message for pt to call back, okay for triage nurse to give results.   >> Mar 03, 2018  4:38 PM Neva Seat wrote: Pt called pt back to receive lab results.  NT was busy. Please call pt back to discuss.

## 2018-03-04 NOTE — Telephone Encounter (Signed)
Left message to call back on patient voicemail.

## 2018-03-05 NOTE — Telephone Encounter (Signed)
I spoke with pt, he is going to decide whether to request the Hepatitis C lab draw. He will call back if he needs this done.

## 2018-05-04 DIAGNOSIS — F4310 Post-traumatic stress disorder, unspecified: Secondary | ICD-10-CM | POA: Diagnosis not present

## 2018-05-04 DIAGNOSIS — F3132 Bipolar disorder, current episode depressed, moderate: Secondary | ICD-10-CM | POA: Diagnosis not present

## 2018-07-05 ENCOUNTER — Other Ambulatory Visit: Payer: Self-pay

## 2018-07-05 ENCOUNTER — Emergency Department
Admission: EM | Admit: 2018-07-05 | Discharge: 2018-07-05 | Disposition: A | Discharge status: Home / Self Care | Payer: Federal, State, Local not specified - PPO | Attending: Emergency Medicine | Admitting: Emergency Medicine

## 2018-07-05 ENCOUNTER — Encounter: Payer: Self-pay | Admitting: Emergency Medicine

## 2018-07-05 ENCOUNTER — Emergency Department: Payer: Federal, State, Local not specified - PPO

## 2018-07-05 DIAGNOSIS — S20212A Contusion of left front wall of thorax, initial encounter: Secondary | ICD-10-CM | POA: Insufficient documentation

## 2018-07-05 DIAGNOSIS — Y999 Unspecified external cause status: Secondary | ICD-10-CM | POA: Insufficient documentation

## 2018-07-05 DIAGNOSIS — Z79899 Other long term (current) drug therapy: Secondary | ICD-10-CM | POA: Insufficient documentation

## 2018-07-05 DIAGNOSIS — Y939 Activity, unspecified: Secondary | ICD-10-CM | POA: Insufficient documentation

## 2018-07-05 DIAGNOSIS — E119 Type 2 diabetes mellitus without complications: Secondary | ICD-10-CM | POA: Diagnosis not present

## 2018-07-05 DIAGNOSIS — Y9289 Other specified places as the place of occurrence of the external cause: Secondary | ICD-10-CM | POA: Diagnosis not present

## 2018-07-05 DIAGNOSIS — R0781 Pleurodynia: Secondary | ICD-10-CM | POA: Diagnosis not present

## 2018-07-05 DIAGNOSIS — S299XXA Unspecified injury of thorax, initial encounter: Secondary | ICD-10-CM | POA: Diagnosis not present

## 2018-07-05 DIAGNOSIS — W01190A Fall on same level from slipping, tripping and stumbling with subsequent striking against furniture, initial encounter: Secondary | ICD-10-CM | POA: Insufficient documentation

## 2018-07-05 DIAGNOSIS — I10 Essential (primary) hypertension: Secondary | ICD-10-CM | POA: Insufficient documentation

## 2018-07-05 MED ORDER — OXYCODONE-ACETAMINOPHEN 10-325 MG PO TABS: 1.0000 | ORAL_TABLET | Freq: Four times a day (QID) | ORAL | 0 refills | Status: DC | PRN

## 2018-07-05 MED ORDER — OXYCODONE-ACETAMINOPHEN 5-325 MG PO TABS
2.0000 | ORAL_TABLET | Freq: Once | ORAL | Status: AC
  Administered 2018-07-05: 2 via ORAL
  Filled 2018-07-05: qty 2

## 2018-07-05 NOTE — ED Provider Notes (Signed)
[facility] Emergency Department Provider Note  ____________________________________________   First MD Initiated Contact with Patient 07/05/18 1352     (approximate)  I have reviewed the triage vital signs and the nursing notes.   HISTORY  Chief Complaint Rib Injury   HPI Hector [name] is a 57 y.o. male presents to the ED with complaint of left lateral chest wall pain.  Patient states he fell yesterday at the [facility] landing on an armrest of a chair.  He denies any head injury or loss of consciousness.  He states that his rib pain has increased since his fall yesterday.  Patient has taken Tylenol last evening without any relief.  He also took 1 of his wife's Percocet 5/325 without complete relief.  He denies any difficulty breathing.  Past Medical History:  Diagnosis Date  . Abnormal LFTs    improved after gastic bypass  . ADJ DISORDER WITH MIXED ANXIETY & DEPRESSED MOOD 08/23/2009   Qualifier: Diagnosis of  By: [name] MD, Standley Brooking Now on lamictal and lexapro and elevil for sleep.   . Anginal pain ([place])   . Anxiety   . B12 nutritional deficiency   . Chest pain    hopsitalized 2 14 evaluation  . Closed head injury    laceration  9 12  no loc  vision change  . DDD (degenerative disc disease)   . Diabetes mellitus    resolved with gastric bypass  . DIABETES MELLITUS, TYPE II 04/06/2007   Qualifier: Diagnosis of  By: [name] MD, Standley Brooking Improved and off meds since gastric bypass   . GERD (gastroesophageal reflux disease)    no meds  . Gout   . Headache    once per week uses Indomethicin hx of cluster headaches   . History of bunionectomy of left great toe   . History of concussion   . History of renal stone    Seen in emergency room not urologist  . Hyperlipidemia   . Hypertension 02/27/2017  . OBESITY 12/10/2007   Qualifier: Diagnosis of  By: [name] MD, Standley Brooking Gastric bypass   . OBSTRUCTIVE SLEEP APNEA 07/22/2007   Qualifier: Diagnosis of  By:  [name] RN, Doroteo Bradford    . OSA (obstructive sleep apnea) 12/12/2013  . Pancreatitis 03/21/2016   went to [place]  . Perforated ulcer ([place])    Surgical repair9/24 2011  . Sleep apnea    had gastric bypass/ sa-gone  . 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 Hypertension 02/27/2017  . Acute gastroenteritis 11/24/2016  . Sepsis ([place]) 11/24/2016  . AKI (acute kidney injury) ([place]) 11/24/2016  . Hypokalemia 11/24/2016  . Chronic low back pain 11/24/2016  . Pancreatitis 03/19/2016  . Renal calculi   . 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 Type II or unspecified type diabetes mellitus without mention of complication, not stated as uncontrolled 05/15/2014  . Lumbar back pain with radiculopathy affecting left lower extremity 05/15/2014  . Medication management 05/15/2014  . Pre-op evaluation 05/15/2014  . Insufficient treatment with nasal CPAP 04/24/2014  . 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 Hypoxemia 04/24/2014  . OSA (obstructive sleep apnea) 12/12/2013  . 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 History of renal stone   . Colon cancer screening 12/28/2012  . GERD (gastroesophageal reflux disease) 12/28/2012  . Visit for preventive health examination 09/06/2012  . History of  gout 09/06/2012  . Easy bruising 03/24/2012  . Headaches due to old head injury 03/24/2012  . Insomnia 03/24/2012  . BACK PAIN WITH RADICULOPATHY 10/07/2010  . Thrombocytopenia ([place]) 05/24/2010  . ADJ DISORDER WITH MIXED ANXIETY & DEPRESSED MOOD 08/23/2009  . BACK PAIN 10/20/2008  . UNS ADVRS EFF OTH RX MEDICINAL&BIOLOGICAL SBSTNC 02/04/2008  . GOUT 08/02/2007  . ASTHMA 07/22/2007  . B12 deficiency 04/06/2007  . CARPAL TUNNEL SYNDROME 04/06/2007    Past Surgical History:  Procedure Laterality Date  . BACK SURGERY  2012   Dr. Dorina Hoyer disk  . CARPAL TUNNEL RELEASE  2008    left-ulnar nerve decomp  . carpel tunnel Left   . CHOLECYSTECTOMY N/A 03/21/2016   Procedure: LAPAROSCOPIC CHOLECYSTECTOMY WITH INTRAOPERATIVE CHOLANGIOGRAM;  Surgeon: Ralene Ok, MD;  Location: [facility];  Service: General;  Laterality: N/A;  . colonscopy     . EPIDURAL BLOCK INJECTION     by Dr. Nelva Bush  . FOOT SURGERY     bilat for removal of extra bone   . GASTRIC BYPASS  11/06/09   beginning weight 267  . HARDWARE REMOVAL N/A 05/02/2016   Procedure: Removal of hardware Lumbar five-Sacral one with Metrex;  Surgeon: Kristeen Miss, MD;  Location: [facility];  Service: Neurosurgery;  Laterality: N/A;  Removal of hardware L5-S1 with Metrex  . HERNIA REPAIR  1696   umbilical  . HIATAL HERNIA REPAIR N/A 01/20/2013   Procedure: LAPAROSCOPIC REPAIR OF INTERNAL HERNIA;  Surgeon: Shann Medal, MD;  Location: [facility];  Service: General;  Laterality: N/A;  . LUMBAR LAMINECTOMY/DECOMPRESSION MICRODISCECTOMY Left 06/07/2014   Procedure: MICRO LUMBAR DECOMPRESSION L5 - S1 ON THE LEFT/L5 FORAMINOTOMY/EXCISION SYNOVIAL CYST  1 LEVEL;  Surgeon: [name], MD;  Location: [facility];  Service: Orthopedics;  Laterality: Left;  [name] MASS EXCISION Right 04/18/2013   Procedure: RIGHT SMALL FINGER SKIN LESION EXCISION;  Surgeon: Jolyn Nap, MD;  Location: [facility];  Service: Orthopedics;  Laterality: Right;  . NASAL SEPTOPLASTY W/ TURBINOPLASTY     x 2  . NEPHROLITHOTOMY Right 11/03/2014   Procedure: RIGHT PERCUTANEOUS NEPHROLITHOTOMY;  Surgeon: Claybon Jabs, MD;  Location: [facility];  Service: Urology;  Laterality: Right;  . SHOULDER ARTHROSCOPY  2014   rt  . surgerical repair of stomach ulcer  06/08/10   per  . TONSILLECTOMY    . UVULOPALATOPHARYNGOPLASTY  2007   with tonsils  . VASECTOMY      Prior to Admission medications   Medication Sig Start Date End Date Taking? Authorizing Provider  acetaminophen (TYLENOL ARTHRITIS PAIN) 650 MG CR tablet Take 650 mg by mouth every 8 (eight)  hours as needed for pain.    [provider]  Cholecalciferol (VITAMIN D-1000 MAX ST) 1000 units tablet Take 1,000 Units by mouth daily.    [provider]  escitalopram (LEXAPRO) 20 MG tablet Take 20 mg by mouth every morning.     [provider]  lamoTRIgine (LAMICTAL) 100 MG tablet Take 100-200 mg by mouth 2 (two) times daily. Take 200 mg in the am and 100 mg qhs.    [provider]  lisinopril (PRINIVIL,ZESTRIL) 10 MG tablet Take 1 tablet (10 mg total) by mouth daily. May increase to 20mg  if needed. Send BP readings in 1 month. 12/29/17   Panosh, Standley Brooking, MD  Multiple Vitamin (MULTIVITAMIN) tablet Take 1 tablet by mouth daily.    [provider]  oxyCODONE-acetaminophen (PERCOCET) 10-325 MG tablet Take 1  tablet by mouth every 6 (six) hours as needed for pain. 07/05/18 07/05/19  [name], PA-C  tamsulosin (FLOMAX) 0.4 MG CAPS capsule Take 1 capsule (0.4 mg total) by mouth daily. 10/29/17   Stoioff, Ronda Fairly, MD  traMADol (ULTRAM) 50 MG tablet Take 1 tablet (50 mg total) by mouth every 6 (six) hours as needed for severe pain. 05/28/17   Lawyer, Harrell Gave, PA-C  traZODone (DESYREL) 100 MG tablet Take 300 mg by mouth at bedtime.     [provider]  vitamin B-12 (CYANOCOBALAMIN) 1000 MCG tablet Take 1,000 mcg by mouth daily.    [provider]    Allergies Aspirin; Cymbalta [duloxetine hcl]; Tape; Nsaids; Hydrocodone; and Vancomycin  Family History  Problem Relation Age of Onset  . Uterine cancer Mother   . Hypertension Mother   . Cervical cancer Mother   . Heart disease Father   . COPD Father   . Colon cancer Maternal Uncle   . Colon cancer Paternal Aunt     Social History Social History   Tobacco Use  . Smoking status: Never Smoker  . Smokeless tobacco: Never Used  Substance Use Topics  . Alcohol use: Yes    Comment: occas liquor   . Drug use: No    Review of Systems Constitutional: No fever/chills Eyes:  No visual changes. ENT: No trauma. Cardiovascular: Denies chest pain. Respiratory: Denies shortness of breath. Gastrointestinal: No abdominal pain.  No nausea, no vomiting.  Musculoskeletal: Positive left rib pain.  Positive history of chronic back pain. Skin: Negative for rash. Neurological: Negative for headaches, focal weakness or numbness. ___________________________________________   PHYSICAL EXAM:  VITAL SIGNS: ED Triage Vitals  Enc Vitals Group     BP 07/05/18 1251 (!) 153/89     Pulse Rate 07/05/18 1251 69     Resp 07/05/18 1251 20     Temp 07/05/18 1251 98.2 F (36.8 C)     Temp Source 07/05/18 1251 Oral     SpO2 07/05/18 1251 98 %     Weight 07/05/18 1252 195 lb (88.5 kg)     Height 07/05/18 1252 5\' 10"  (1.778 m)     Head Circumference --      Peak Flow --      Pain Score 07/05/18 1259 7     Pain Loc --      Pain Edu? --      Excl. in [place]? --    Constitutional: Alert and oriented. Well appearing and in no acute distress. Eyes: Conjunctivae are normal. PERRL. EOMI. Head: Atraumatic. Nose: No trauma. Neck: No stridor.   Cardiovascular: Normal rate, regular rhythm. Grossly normal heart sounds.  Good peripheral circulation. Respiratory: Normal respiratory effort.  No retractions. Lungs CTAB.  Left lateral ribs are markedly tender but without any signs of deformity.  No ecchymosis or abrasions were seen.  There is minimal soft tissue edema present. Gastrointestinal: Soft and nontender. No distention.  Bowel sounds normoactive x4 quadrants. Musculoskeletal: Moves upper and lower extremities without any difficulty. Neurologic:  Normal speech and language. No gross focal neurologic deficits are appreciated. No gait instability. Skin:  Skin is warm, dry and intact. No rash noted. Psychiatric: Mood and affect are normal. Speech and behavior are normal.  ____________________________________________   LABS (all labs ordered are listed, but only abnormal results are  displayed)  Labs Reviewed - No data to display  RADIOLOGY  Official radiology report(s): Dg Ribs Unilateral W/chest Left  Result Date: 07/05/2018 CLINICAL DATA:  Fall yesterday with left-sided chest pain, initial encounter EXAM: LEFT RIBS AND CHEST - 3+ VIEW COMPARISON:  05/28/2017 FINDINGS: Cardiac shadow is stable. The lungs are well aerated bilaterally. Left basilar atelectasis is noted. No acute fracture or dislocation is noted. IMPRESSION: No acute rib fracture is noted. Electronically Signed   By: Inez Catalina M.D.   On: 07/05/2018 13:21    ____________________________________________   PROCEDURES  Procedure(s) performed: None  Procedures  Critical Care performed: No  ____________________________________________   INITIAL IMPRESSION / ASSESSMENT AND PLAN / ED COURSE  As part of my medical decision making, I reviewed the following data within the electronic MEDICAL RECORD NUMBER Notes from prior ED visits and [place] Controlled Substance Database  Patient presents to the ED with complaint of left lateral rib pain after falling yesterday.  He states that this was a mechanical fall and not a syncopal episode.  Last evening he took 2 Tylenol without any relief and then also took 1 of his wife's Percocet.  He continues to have pain this morning.  X-ray is negative for acute rib fracture.  Patient was given Percocet while in the ED and a prescription to continue every 6 hours as needed for pain.  He is to follow-up with his PCP if any continued pain medication is needed.  Patient is allergic to NSAIDs and hydrocodone.  ____________________________________________   FINAL CLINICAL IMPRESSION(S) / ED DIAGNOSES  Final diagnoses:  Contusion of rib on left side, initial encounter     ED Discharge Orders         Ordered    oxyCODONE-acetaminophen (PERCOCET) 10-325 MG tablet  Every 6 hours PRN     07/05/18 1445           Note:  This document was prepared using Dragon voice  recognition software and may include unintentional dictation errors.    [name], PA-C 07/05/18 1500    Harvest Dark, MD 07/05/18 1534

## 2018-07-05 NOTE — Discharge Instructions (Signed)
Follow-up with your primary care provider if any continued problems.  Begin taking Percocet 10/325 1 every 6 hours as needed for pain.  You may apply ice to your ribs as needed for discomfort.  Return to the emergency department if any severe worsening of your symptoms.

## 2018-07-05 NOTE — ED Triage Notes (Signed)
Fell yesterday, hit L chest on L armrest painful.

## 2018-07-10 ENCOUNTER — Emergency Department
Admission: EM | Admit: 2018-07-10 | Discharge: 2018-07-10 | Disposition: A | Discharge status: Home / Self Care | Payer: Federal, State, Local not specified - PPO | Attending: Emergency Medicine | Admitting: Emergency Medicine

## 2018-07-10 ENCOUNTER — Encounter: Payer: Self-pay | Admitting: Medical Oncology

## 2018-07-10 ENCOUNTER — Emergency Department: Payer: Federal, State, Local not specified - PPO

## 2018-07-10 ENCOUNTER — Other Ambulatory Visit: Payer: Self-pay

## 2018-07-10 DIAGNOSIS — E119 Type 2 diabetes mellitus without complications: Secondary | ICD-10-CM | POA: Insufficient documentation

## 2018-07-10 DIAGNOSIS — R1012 Left upper quadrant pain: Secondary | ICD-10-CM | POA: Insufficient documentation

## 2018-07-10 DIAGNOSIS — W228XXA Striking against or struck by other objects, initial encounter: Secondary | ICD-10-CM | POA: Diagnosis not present

## 2018-07-10 DIAGNOSIS — Z79899 Other long term (current) drug therapy: Secondary | ICD-10-CM | POA: Diagnosis not present

## 2018-07-10 DIAGNOSIS — Y9259 Other trade areas as the place of occurrence of the external cause: Secondary | ICD-10-CM | POA: Diagnosis not present

## 2018-07-10 DIAGNOSIS — S3991XA Unspecified injury of abdomen, initial encounter: Secondary | ICD-10-CM | POA: Diagnosis not present

## 2018-07-10 DIAGNOSIS — S2232XA Fracture of one rib, left side, initial encounter for closed fracture: Secondary | ICD-10-CM | POA: Diagnosis not present

## 2018-07-10 DIAGNOSIS — R0781 Pleurodynia: Secondary | ICD-10-CM | POA: Diagnosis not present

## 2018-07-10 DIAGNOSIS — Y999 Unspecified external cause status: Secondary | ICD-10-CM | POA: Insufficient documentation

## 2018-07-10 DIAGNOSIS — I10 Essential (primary) hypertension: Secondary | ICD-10-CM | POA: Insufficient documentation

## 2018-07-10 DIAGNOSIS — Y9301 Activity, walking, marching and hiking: Secondary | ICD-10-CM | POA: Diagnosis not present

## 2018-07-10 DIAGNOSIS — S299XXA Unspecified injury of thorax, initial encounter: Secondary | ICD-10-CM | POA: Diagnosis not present

## 2018-07-10 DIAGNOSIS — J45909 Unspecified asthma, uncomplicated: Secondary | ICD-10-CM | POA: Diagnosis not present

## 2018-07-10 LAB — TROPONIN I

## 2018-07-10 LAB — CBC WITH DIFFERENTIAL/PLATELET
ABS IMMATURE GRANULOCYTES: 0.02 10*3/uL (ref 0.00–0.07)
BASOS ABS: 0 10*3/uL (ref 0.0–0.1)
BASOS PCT: 0 %
Eosinophils Absolute: 0 10*3/uL (ref 0.0–0.5)
Eosinophils Relative: 1 %
HCT: 45.7 % (ref 39.0–52.0)
Hemoglobin: 15.7 g/dL (ref 13.0–17.0)
IMMATURE GRANULOCYTES: 0 %
Lymphocytes Relative: 18 %
Lymphs Abs: 1 10*3/uL (ref 0.7–4.0)
MCH: 35 pg — ABNORMAL HIGH (ref 26.0–34.0)
MCHC: 34.4 g/dL (ref 30.0–36.0)
MCV: 101.8 fL — AB (ref 80.0–100.0)
Monocytes Absolute: 0.4 10*3/uL (ref 0.1–1.0)
Monocytes Relative: 8 %
NEUTROS ABS: 4.1 10*3/uL (ref 1.7–7.7)
NEUTROS PCT: 73 %
NRBC: 0 % (ref 0.0–0.2)
PLATELETS: 144 10*3/uL — AB (ref 150–400)
RBC: 4.49 MIL/uL (ref 4.22–5.81)
RDW: 12.7 % (ref 11.5–15.5)
WBC: 5.6 10*3/uL (ref 4.0–10.5)

## 2018-07-10 LAB — URINALYSIS, COMPLETE (UACMP) WITH MICROSCOPIC
BACTERIA UA: NONE SEEN
BILIRUBIN URINE: NEGATIVE
Glucose, UA: 50 mg/dL — AB
Hgb urine dipstick: NEGATIVE
KETONES UR: NEGATIVE mg/dL
Leukocytes, UA: NEGATIVE
NITRITE: NEGATIVE
Protein, ur: NEGATIVE mg/dL
SQUAMOUS EPITHELIAL / LPF: NONE SEEN (ref 0–5)
Specific Gravity, Urine: 1.005 (ref 1.005–1.030)
pH: 5 (ref 5.0–8.0)

## 2018-07-10 LAB — COMPREHENSIVE METABOLIC PANEL
ALT: 22 U/L (ref 0–44)
ANION GAP: 12 (ref 5–15)
AST: 40 U/L (ref 15–41)
Albumin: 3.9 g/dL (ref 3.5–5.0)
Alkaline Phosphatase: 76 U/L (ref 38–126)
BILIRUBIN TOTAL: 0.6 mg/dL (ref 0.3–1.2)
BUN: 11 mg/dL (ref 6–20)
CO2: 24 mmol/L (ref 22–32)
Calcium: 9 mg/dL (ref 8.9–10.3)
Chloride: 105 mmol/L (ref 98–111)
Creatinine, Ser: 1.52 mg/dL — ABNORMAL HIGH (ref 0.61–1.24)
GFR calc non Af Amer: 50 mL/min — ABNORMAL LOW (ref >60)
GFR, EST AFRICAN AMERICAN: 57 mL/min — AB (ref >60)
GLUCOSE: 101 mg/dL — AB (ref 70–99)
POTASSIUM: 3.8 mmol/L (ref 3.5–5.1)
SODIUM: 141 mmol/L (ref 135–145)
TOTAL PROTEIN: 6.6 g/dL (ref 6.5–8.1)

## 2018-07-10 MED ORDER — HYDROMORPHONE HCL 1 MG/ML IJ SOLN
1.0000 mg | Freq: Once | INTRAMUSCULAR | Status: AC
  Administered 2018-07-10: 1 mg via INTRAVENOUS
  Filled 2018-07-10: qty 1

## 2018-07-10 MED ORDER — OXYCODONE-ACETAMINOPHEN 10-325 MG PO TABS: 1.0000 | ORAL_TABLET | Freq: Four times a day (QID) | ORAL | 0 refills | Status: DC | PRN

## 2018-07-10 MED ORDER — IOPAMIDOL (ISOVUE-300) INJECTION 61%
100.0000 mL | Freq: Once | INTRAVENOUS | Status: AC | PRN
  Administered 2018-07-10: 100 mL via INTRAVENOUS
  Filled 2018-07-10: qty 100

## 2018-07-10 MED ORDER — PREDNISONE 10 MG PO TABS: ORAL_TABLET | ORAL | 0 refills | Status: DC

## 2018-07-10 MED ORDER — ONDANSETRON HCL 4 MG/2ML IJ SOLN
4.0000 mg | Freq: Once | INTRAMUSCULAR | Status: AC
  Administered 2018-07-10: 4 mg via INTRAVENOUS
  Filled 2018-07-10: qty 2

## 2018-07-10 NOTE — ED Notes (Addendum)
Pt reports  Pain l  Rib area  Hurts when he takes a deep breath and or  Coughs  He reports he had fell   5 days  Ago  And  Was seen ed at that time had x rays which he stated were neg  he reports was on pain meds and is out and he reports pain is worse  Pt has some  Bruising to  Rib cage

## 2018-07-10 NOTE — ED Triage Notes (Signed)
Pt ambulatory to desk with reports that he had a left sided rib injury on 10/21,was seen and given percocet for pain. Pt reports pain med not helping.

## 2018-07-10 NOTE — ED Provider Notes (Signed)
[facility] Emergency Department Provider Note   ____________________________________________   First MD Initiated Contact with Patient 07/10/18 1240     (approximate)  I have reviewed the triage vital signs and the nursing notes.   HISTORY  Chief Complaint Chest Pain   HPI Hector [name] is a 57 y.o. male [place] Maw to the ED with complaint of continued left rib pain.  Patient was seen in the ED on 10/21 at which time left rib x-rays were negative for rib fracture.  Patient was [place] and stepped around his wife to sit down when he landed on the chair armrest.  He is continued to have left rib pain since that time and states that the Percocet is not helping.  He denies any chest pain or shortness of breath other than it hurts to take deep breaths.  Denies any diaphoresis or history of cardiac illness.  He denies any hematuria and also has had normal bowel movements.  He rates his pain as a 10/10.   Past Medical History:  Diagnosis Date  . Abnormal LFTs    improved after gastic bypass  . ADJ DISORDER WITH MIXED ANXIETY & DEPRESSED MOOD 08/23/2009   Qualifier: Diagnosis of  By: [name] MD, Standley Brooking Now on lamictal and lexapro and elevil for sleep.   . Anginal pain ([place])   . Anxiety   . B12 nutritional deficiency   . Chest pain    hopsitalized 2 14 evaluation  . Closed head injury    laceration  9 12  no loc  vision change  . DDD (degenerative disc disease)   . Diabetes mellitus    resolved with gastric bypass  . DIABETES MELLITUS, TYPE II 04/06/2007   Qualifier: Diagnosis of  By: [name] MD, Standley Brooking Improved and off meds since gastric bypass   . GERD (gastroesophageal reflux disease)    no meds  . Gout   . Headache    once per week uses Indomethicin hx of cluster headaches   . History of bunionectomy of left great toe   . History of concussion   . History of renal stone    Seen in emergency room not urologist  . Hyperlipidemia   .  Hypertension 02/27/2017  . OBESITY 12/10/2007   Qualifier: Diagnosis of  By: [name] MD, Standley Brooking Gastric bypass   . OBSTRUCTIVE SLEEP APNEA 07/22/2007   Qualifier: Diagnosis of  By: [name] RN, Doroteo Bradford    . OSA (obstructive sleep apnea) 12/12/2013  . Pancreatitis 03/21/2016   went to [place]  . Perforated ulcer ([place])    Surgical repair9/24 2011  . Sleep apnea    had gastric bypass/ sa-gone  . 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 Hypertension 02/27/2017  . Acute gastroenteritis 11/24/2016  . Sepsis ([place]) 11/24/2016  . AKI (acute kidney injury) ([place]) 11/24/2016  . Hypokalemia 11/24/2016  . Chronic low back pain 11/24/2016  . Pancreatitis 03/19/2016  . Renal calculi   . 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 Type II or unspecified type diabetes mellitus without mention of complication, not stated as uncontrolled 05/15/2014  . Lumbar back pain with radiculopathy affecting left lower extremity 05/15/2014  . Medication management 05/15/2014  . Pre-op evaluation 05/15/2014  . Insufficient treatment with nasal CPAP 04/24/2014  . 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 Hypoxemia 04/24/2014  . OSA (obstructive sleep apnea)  12/12/2013  . 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 History of renal stone   . Colon cancer screening 12/28/2012  . GERD (gastroesophageal reflux disease) 12/28/2012  . Visit for preventive health examination 09/06/2012  . History of gout 09/06/2012  . Easy bruising 03/24/2012  . Headaches due to old head injury 03/24/2012  . Insomnia 03/24/2012  . BACK PAIN WITH RADICULOPATHY 10/07/2010  . Thrombocytopenia ([place]) 05/24/2010  . ADJ DISORDER WITH MIXED ANXIETY & DEPRESSED MOOD 08/23/2009  . BACK PAIN 10/20/2008  . UNS ADVRS EFF OTH RX MEDICINAL&BIOLOGICAL SBSTNC 02/04/2008  . GOUT 08/02/2007  . ASTHMA 07/22/2007  . B12 deficiency 04/06/2007  .  CARPAL TUNNEL SYNDROME 04/06/2007    Past Surgical History:  Procedure Laterality Date  . BACK SURGERY  2012   Dr. Dorina Hoyer disk  . CARPAL TUNNEL RELEASE  2008   left-ulnar nerve decomp  . carpel tunnel Left   . CHOLECYSTECTOMY N/A 03/21/2016   Procedure: LAPAROSCOPIC CHOLECYSTECTOMY WITH INTRAOPERATIVE CHOLANGIOGRAM;  Surgeon: Ralene Ok, MD;  Location: [facility];  Service: General;  Laterality: N/A;  . colonscopy     . EPIDURAL BLOCK INJECTION     by Dr. Nelva Bush  . FOOT SURGERY     bilat for removal of extra bone   . GASTRIC BYPASS  11/06/09   beginning weight 267  . HARDWARE REMOVAL N/A 05/02/2016   Procedure: Removal of hardware Lumbar five-Sacral one with Metrex;  Surgeon: Kristeen Miss, MD;  Location: [place] [facility];  Service: Neurosurgery;  Laterality: N/A;  Removal of hardware L5-S1 with Metrex  . HERNIA REPAIR  3220   umbilical  . HIATAL HERNIA REPAIR N/A 01/20/2013   Procedure: LAPAROSCOPIC REPAIR OF INTERNAL HERNIA;  Surgeon: Shann Medal, MD;  Location: [facility];  Service: General;  Laterality: N/A;  . LUMBAR LAMINECTOMY/DECOMPRESSION MICRODISCECTOMY Left 06/07/2014   Procedure: MICRO LUMBAR DECOMPRESSION L5 - S1 ON THE LEFT/L5 FORAMINOTOMY/EXCISION SYNOVIAL CYST  1 LEVEL;  Surgeon: [name], MD;  Location: [facility];  Service: Orthopedics;  Laterality: Left;  [name] MASS EXCISION Right 04/18/2013   Procedure: RIGHT SMALL FINGER SKIN LESION EXCISION;  Surgeon: Jolyn Nap, MD;  Location: [facility];  Service: Orthopedics;  Laterality: Right;  . NASAL SEPTOPLASTY W/ TURBINOPLASTY     x 2  . NEPHROLITHOTOMY Right 11/03/2014   Procedure: RIGHT PERCUTANEOUS NEPHROLITHOTOMY;  Surgeon: Claybon Jabs, MD;  Location: [facility];  Service: Urology;  Laterality: Right;  . SHOULDER ARTHROSCOPY  2014   rt  . surgerical repair of stomach ulcer  06/08/10   per  . TONSILLECTOMY    . UVULOPALATOPHARYNGOPLASTY  2007   with tonsils  . VASECTOMY      Prior to Admission  medications   Medication Sig Start Date End Date Taking? Authorizing Provider  acetaminophen (TYLENOL ARTHRITIS PAIN) 650 MG CR tablet Take 650 mg by mouth every 8 (eight) hours as needed for pain.    [provider]  Cholecalciferol (VITAMIN D-1000 MAX ST) 1000 units tablet Take 1,000 Units by mouth daily.    [provider]  escitalopram (LEXAPRO) 20 MG tablet Take 20 mg by mouth every morning.     [provider]  lamoTRIgine (LAMICTAL) 100 MG tablet Take 100-200 mg by mouth 2 (two) times daily. Take 200 mg in the am and 100 mg qhs.    [provider]  lisinopril (PRINIVIL,ZESTRIL) 10 MG tablet Take 1 tablet (10 mg total) by mouth daily. May increase  to 20mg  if needed. Send BP readings in 1 month. 12/29/17   Panosh, Standley Brooking, MD  Multiple Vitamin (MULTIVITAMIN) tablet Take 1 tablet by mouth daily.    [provider]  oxyCODONE-acetaminophen (PERCOCET) 10-325 MG tablet Take 1 tablet by mouth every 6 (six) hours as needed for pain. 07/10/18 07/10/19  [name], PA-C  predniSONE (DELTASONE) 10 MG tablet Take 3 tablets once a day for 5 days 07/10/18   [name], PA-C  tamsulosin (FLOMAX) 0.4 MG CAPS capsule Take 1 capsule (0.4 mg total) by mouth daily. 10/29/17   Stoioff, Ronda Fairly, MD  traZODone (DESYREL) 100 MG tablet Take 300 mg by mouth at bedtime.     [provider]  vitamin B-12 (CYANOCOBALAMIN) 1000 MCG tablet Take 1,000 mcg by mouth daily.    [provider]    Allergies Aspirin; Cymbalta [duloxetine hcl]; Tape; Nsaids; Hydrocodone; and Vancomycin  Family History  Problem Relation Age of Onset  . Uterine cancer Mother   . Hypertension Mother   . Cervical cancer Mother   . Heart disease Father   . COPD Father   . Colon cancer Maternal Uncle   . Colon cancer Paternal Aunt     Social History Social History   Tobacco Use  . Smoking status: Never Smoker  . Smokeless tobacco: Never Used  Substance Use  Topics  . Alcohol use: Yes    Comment: occas liquor   . Drug use: No    Review of Systems Constitutional: No fever/chills Eyes: No visual changes. Cardiovascular: Denies chest pain. Respiratory: Denies shortness of breath.  Left lateral rib pain. Gastrointestinal: No abdominal pain.  Positive mild nausea, no vomiting.  Genitourinary: Negative for dysuria.  Negative for hematuria. Musculoskeletal: Negative for back pain.  Rib pain as noted above. Skin: Negative for rash. Neurological: Negative for headaches, focal weakness or numbness. ___________________________________________   PHYSICAL EXAM:  VITAL SIGNS: ED Triage Vitals  Enc Vitals Group     BP 07/10/18 1221 123/77     Pulse Rate 07/10/18 1221 96     Resp 07/10/18 1221 18     Temp 07/10/18 1221 98.2 F (36.8 C)     Temp Source 07/10/18 1221 Oral     SpO2 07/10/18 1221 97 %     Weight 07/10/18 1215 194 lb 0.1 oz (88 kg)     Height 07/10/18 1215 5\' 10"  (1.778 m)     Head Circumference --      Peak Flow --      Pain Score 07/10/18 1215 10     Pain Loc --      Pain Edu? --      Excl. in [place]? --    Constitutional: Alert and oriented. Well appearing and in no acute distress. Eyes: Conjunctivae are normal.  Head: Atraumatic. Neck: No stridor.   Cardiovascular: Normal rate, regular rhythm. Grossly normal heart sounds.  Good peripheral circulation. Respiratory: Normal respiratory effort.  No retractions. Lungs CTAB. Gastrointestinal: Soft and nontender. No distention.  Bowel sounds are normoactive x4 quadrants.   Musculoskeletal: Moderate tenderness is noted to the left lateral ribs approximately 8, 9, 10 rib area.  No deformity is noted.  There is a 2 cm ecchymotic area on the left lateral rib cage in approximately the ninth and 10th rib area.  No crepitus on auscultation.  Skin is intact. Neurologic:  Normal speech and language. No gross focal neurologic deficits are appreciated.  Skin:  Skin is warm, dry and  intact.   Ecchymotic area as noted above. Psychiatric: Mood and affect are normal. Speech and behavior are normal.  ____________________________________________   LABS (all labs ordered are listed, but only abnormal results are displayed)  Labs Reviewed  URINALYSIS, COMPLETE (UACMP) WITH MICROSCOPIC - Abnormal; Notable for the following components:      Result Value   Color, Urine STRAW (*)    APPearance CLEAR (*)    Glucose, UA 50 (*)    All other components within normal limits  COMPREHENSIVE METABOLIC PANEL - Abnormal; Notable for the following components:   Glucose, Bld 101 (*)    Creatinine, Ser 1.52 (*)    GFR calc non Af Amer 50 (*)    GFR calc Af Amer 57 (*)    All other components within normal limits  CBC WITH DIFFERENTIAL/PLATELET - Abnormal; Notable for the following components:   MCV 101.8 (*)    MCH 35.0 (*)    Platelets 144 (*)    All other components within normal limits  TROPONIN I   ____________________________________________  EKG  EKG was read by Major Dr. beside.  Normal sinus rhythm with a ventricular rate of 81.  PR interval 128.  QRS duration 90 ____________________________________________  RADIOLOGY   Official radiology report(s): Ct Chest W Contrast  Result Date: 07/10/2018 CLINICAL DATA:  Left rib pain when taking deep breath or cough. Patient fell 5 days ago. EXAM: CT CHEST, ABDOMEN, AND PELVIS WITH CONTRAST TECHNIQUE: Multidetector CT imaging of the chest, abdomen and pelvis was performed following the standard protocol during bolus administration of intravenous contrast. CONTRAST:  135mL ISOVUE-300 IOPAMIDOL (ISOVUE-300) INJECTION 61% COMPARISON:  07/05/2018 CXR and left rib radiographs, 05/28/2017 CT FINDINGS: CT CHEST FINDINGS Cardiovascular: Ascending thoracic aortic aneurysm 4 1 cm, unchanged. No dissection. Slight dilatation of the main pulmonary artery to 2.9 cm. No large central pulmonary embolus. Top-normal size heart without pericardial effusion  or thickening. Conventional branch pattern of the great vessels. Mediastinum/Nodes: No enlarged mediastinal, hilar, or axillary lymph nodes. Thyroid gland, trachea, and esophagus demonstrate no significant findings. Lungs/Pleura: No pneumothorax. Subpleural atelectasis in the left lower lobe. Pleural effusion or thickening. Musculoskeletal: Acute minimally displaced left lateral ninth rib fracture. Intact manubrium, sternum and thoracic spine. CT ABDOMEN PELVIS FINDINGS Hepatobiliary: Steatosis of the liver. No laceration or mass. Gallbladder appears surgically absent. No biliary dilatation. Pancreas: 2 x 1.7 x 2.7 cm cyst at the junction of the pancreatic head and uncinate process. No soft tissue component is identified. This could represent a small pseudocyst or possibly an IPMN. No choledocholithiasis. No acute pancreatic inflammation. Spleen: Normal in size without focal abnormality. Adrenals/Urinary Tract: Normal bilateral adrenal glands. Possible small right lower pole parapelvic renal cyst measuring 9 mm, too small to further characterize. No obstructive uropathy. The urinary bladder is unremarkable for the degree of distention. Stomach/Bowel: Status post bariatric surgery without dehiscence. A few scattered fluid-filled small bowel loops are noted within the mid and left hemiabdomen, nonspecific. No definite evidence of mechanical bowel obstruction. The distal and terminal ileum as well as appendix appear unremarkable. No large bowel inflammation or obstruction. Scattered colonic diverticulosis. Vascular/Lymphatic: No significant vascular findings are present. No enlarged abdominal or pelvic lymph nodes. Reproductive: Prostate is unremarkable. Other: Periumbilical ventral abdominal wall scarring. No free air nor free fluid. Musculoskeletal: L4-5 lumbar fusion with lumbar facet arthropathy L3 through S1. IMPRESSION: Chest CT: 1. Acute nondisplaced left ninth rib fracture without associated hemothorax or  pneumothorax. Adjacent atelectasis however is seen. 2. Ascending thoracic  aortic aneurysm to 4.1 cm. Recommend annual imaging followup by CTA or MRA. This recommendation follows 2010 ACCF/AHA/AATS/ACR/ASA/SCA/SCAI/SIR/STS/SVM Guidelines for the Diagnosis and Management of Patients with Thoracic Aortic Disease. Circulation. 2010; 121: F790-W409 CT AP: 1. No acute solid nor hollow visceral organ injury. 2. Status post cholecystectomy. 3. 2 x 1.7 x 2.7 cm pancreatic cystic lesion at the junction of the pancreatic head and uncinate process. No definite worrisome features. Reimaging every 6 months x4 per consensus guidelines is recommended initially. This recommendation follows ACR consensus guidelines: Management of Incidental Pancreatic Cysts: A White Paper of the ACR Incidental Findings Committee. J Am Coll Radiol 7353;29:924-268. 4. Status post gastric bypass without complicating features. 5. L4-5 spinal fusion. 6. Other ancillary findings as above. Electronically Signed   By: Ashley Royalty M.D.   On: 07/10/2018 14:36   Ct Abdomen Pelvis W Contrast  Result Date: 07/10/2018 CLINICAL DATA:  Left rib pain when taking deep breath or cough. Patient fell 5 days ago. EXAM: CT CHEST, ABDOMEN, AND PELVIS WITH CONTRAST TECHNIQUE: Multidetector CT imaging of the chest, abdomen and pelvis was performed following the standard protocol during bolus administration of intravenous contrast. CONTRAST:  119mL ISOVUE-300 IOPAMIDOL (ISOVUE-300) INJECTION 61% COMPARISON:  07/05/2018 CXR and left rib radiographs, 05/28/2017 CT FINDINGS: CT CHEST FINDINGS Cardiovascular: Ascending thoracic aortic aneurysm 4 1 cm, unchanged. No dissection. Slight dilatation of the main pulmonary artery to 2.9 cm. No large central pulmonary embolus. Top-normal size heart without pericardial effusion or thickening. Conventional branch pattern of the great vessels. Mediastinum/Nodes: No enlarged mediastinal, hilar, or axillary lymph nodes. Thyroid gland,  trachea, and esophagus demonstrate no significant findings. Lungs/Pleura: No pneumothorax. Subpleural atelectasis in the left lower lobe. Pleural effusion or thickening. Musculoskeletal: Acute minimally displaced left lateral ninth rib fracture. Intact manubrium, sternum and thoracic spine. CT ABDOMEN PELVIS FINDINGS Hepatobiliary: Steatosis of the liver. No laceration or mass. Gallbladder appears surgically absent. No biliary dilatation. Pancreas: 2 x 1.7 x 2.7 cm cyst at the junction of the pancreatic head and uncinate process. No soft tissue component is identified. This could represent a small pseudocyst or possibly an IPMN. No choledocholithiasis. No acute pancreatic inflammation. Spleen: Normal in size without focal abnormality. Adrenals/Urinary Tract: Normal bilateral adrenal glands. Possible small right lower pole parapelvic renal cyst measuring 9 mm, too small to further characterize. No obstructive uropathy. The urinary bladder is unremarkable for the degree of distention. Stomach/Bowel: Status post bariatric surgery without dehiscence. A few scattered fluid-filled small bowel loops are noted within the mid and left hemiabdomen, nonspecific. No definite evidence of mechanical bowel obstruction. The distal and terminal ileum as well as appendix appear unremarkable. No large bowel inflammation or obstruction. Scattered colonic diverticulosis. Vascular/Lymphatic: No significant vascular findings are present. No enlarged abdominal or pelvic lymph nodes. Reproductive: Prostate is unremarkable. Other: Periumbilical ventral abdominal wall scarring. No free air nor free fluid. Musculoskeletal: L4-5 lumbar fusion with lumbar facet arthropathy L3 through S1. IMPRESSION: Chest CT: 1. Acute nondisplaced left ninth rib fracture without associated hemothorax or pneumothorax. Adjacent atelectasis however is seen. 2. Ascending thoracic aortic aneurysm to 4.1 cm. Recommend annual imaging followup by CTA or MRA. This  recommendation follows 2010 ACCF/AHA/AATS/ACR/ASA/SCA/SCAI/SIR/STS/SVM Guidelines for the Diagnosis and Management of Patients with Thoracic Aortic Disease. Circulation. 2010; 121: T419-Q222 CT AP: 1. No acute solid nor hollow visceral organ injury. 2. Status post cholecystectomy. 3. 2 x 1.7 x 2.7 cm pancreatic cystic lesion at the junction of the pancreatic head and uncinate process. No definite worrisome features.  Reimaging every 6 months x4 per consensus guidelines is recommended initially. This recommendation follows ACR consensus guidelines: Management of Incidental Pancreatic Cysts: A White Paper of the ACR Incidental Findings Committee. J Am Coll Radiol 3212;24:825-003. 4. Status post gastric bypass without complicating features. 5. L4-5 spinal fusion. 6. Other ancillary findings as above. Electronically Signed   By: Ashley Royalty M.D.   On: 07/10/2018 14:36    ____________________________________________   PROCEDURES  Procedure(s) performed: None  Procedures  Critical Care performed: No  ____________________________________________   INITIAL IMPRESSION / ASSESSMENT AND PLAN / ED COURSE  As part of my medical decision making, I reviewed the following data within the electronic MEDICAL RECORD NUMBER Notes from prior ED visits and  Controlled Substance Database  Patient presents to the ED with complaint of left lateral rib pain.  He was seen earlier in the week for an injury sustained when he fell at the [facility].  He states that this despite pain medication he continues to have pain.  Pain is increased with deep inspiration.  He denies any chest pain, dizziness, difficulty breathing.  He was currently taking Percocet but was unable to control the pain at home.  Lab work and CT scan was ordered to evaluate and reassure that there was not an injury that occurred to the spleen.  CT scan was discussed with patient and patient was made aware that he has an aneurysm that measures 4.1 cm on his chest  CT that is unchanged from a previous image.  Patient was made aware that this should be followed yearly.  There was also a cyst noted on the liver that is suggested that it be reevaluated in 6 months.  Patient currently is changing doctors to someone locally in [place].  He was given Dilaudid IV while in the emergency department and pain was under control at the time of his discharge.  He will continue taking Percocet 10/325 every 6 hours as needed for pain and also prednisone for inflammation.  Patient is status post gastric bypass and was told not to take NSAIDs.  He is to return to the emergency department if any further problems or urgent concerns.  ____________________________________________   FINAL CLINICAL IMPRESSION(S) / ED DIAGNOSES  Final diagnoses:  Closed fracture of one rib of left side, initial encounter     ED Discharge Orders         Ordered    oxyCODONE-acetaminophen (PERCOCET) 10-325 MG tablet  Every 6 hours PRN     07/10/18 1504    predniSONE (DELTASONE) 10 MG tablet     07/10/18 1504           Note:  This document was prepared using Dragon voice recognition software and may include unintentional dictation errors.    [name], PA-C 07/10/18 1656    Lisa Roca, MD 07/12/18 [phone]

## 2018-07-10 NOTE — Discharge Instructions (Addendum)
Follow-up with [facility] acute care if any continued problems and for pain management as needed for your fractured rib.  Also begin taking prednisone 3 tablets once a day for the next 5 days to help with inflammation.  Use ice to your ribs as needed for discomfort.  Use a pillow to support your ribs if taking a deep breath or coughing.  Also when you establish a PCP in [place] mentioned that you need to have imaging done in 6 months for a cyst on your pancreas and also in 1 year to evaluate your aneurysm. Return to the emergency department if any severe worsening of your symptoms.

## 2018-08-09 DIAGNOSIS — Z9049 Acquired absence of other specified parts of digestive tract: Secondary | ICD-10-CM | POA: Diagnosis not present

## 2018-08-09 DIAGNOSIS — K862 Cyst of pancreas: Secondary | ICD-10-CM | POA: Diagnosis not present

## 2018-08-09 DIAGNOSIS — R197 Diarrhea, unspecified: Secondary | ICD-10-CM | POA: Diagnosis not present

## 2018-08-11 ENCOUNTER — Other Ambulatory Visit: Payer: Self-pay | Admitting: General Surgery

## 2018-08-11 DIAGNOSIS — K862 Cyst of pancreas: Secondary | ICD-10-CM

## 2018-08-18 DIAGNOSIS — I10 Essential (primary) hypertension: Secondary | ICD-10-CM | POA: Diagnosis not present

## 2018-08-18 DIAGNOSIS — I712 Thoracic aortic aneurysm, without rupture: Secondary | ICD-10-CM | POA: Diagnosis not present

## 2018-08-18 DIAGNOSIS — Z79899 Other long term (current) drug therapy: Secondary | ICD-10-CM | POA: Diagnosis not present

## 2018-09-02 ENCOUNTER — Ambulatory Visit: Payer: Self-pay

## 2018-09-02 NOTE — Telephone Encounter (Signed)
Pt c/o fatigue, mild shortness of breath, productive cough(pt swallows secretions), Pt chest hurts with coughing. Pt has a healing fractured rib. Pt c/o lungs feel tight but pt is not wheezing. No fever. Pt did not want to go to [facility] or appt today because of family get together. Care advice given and pt verbalized understanding. Pt given appointment tomorrow. Pt advised to go to ED is worsening SOB, chest pain or worsens. Pt verbalized understanding.  Reason for Disposition . [1] MILD difficulty breathing (e.g., minimal/no SOB at rest, SOB with walking, pulse <100) AND [2] NEW-onset or WORSE than normal  Answer Assessment - Initial Assessment Questions 1. RESPIRATORY STATUS: "Describe your breathing?" (e.g., wheezing, shortness of breath, unable to speak, severe coughing)      Shortness of breath 2. ONSET: "When did this breathing problem begin?"      2 days ago 3. PATTERN "Does the difficult breathing come and go, or has it been constant since it started?"      Constant since it started 4. SEVERITY: "How bad is your breathing?" (e.g., mild, moderate, severe)    - MILD: No SOB at rest, mild SOB with walking, speaks normally in sentences, can lay down, no retractions, pulse < 100.    - MODERATE: SOB at rest, SOB with minimal exertion and prefers to sit, cannot lie down flat, speaks in phrases, mild retractions, audible wheezing, pulse 100-120.    - SEVERE: Very SOB at rest, speaks in single words, struggling to breathe, sitting hunched forward, retractions, pulse > 120      mild 5. RECURRENT SYMPTOM: "Have you had difficulty breathing before?" If so, ask: "When was the last time?" and "What happened that time?"      Yes many years did a cardiac work up dx with angina 6. CARDIAC HISTORY: "Do you have any history of heart disease?" (e.g., heart attack, angina, bypass surgery, angioplasty)      Angina, aortic anerysm 7. LUNG HISTORY: "Do you have any history of lung disease?"  (e.g., pulmonary  embolus, asthma, emphysema)     no 8. CAUSE: "What do you think is causing the breathing problem?"      Lung related  9. OTHER SYMPTOMS: "Do you have any other symptoms? (e.g., dizziness, runny nose, cough, chest pain, fever)     Cough (productive), sore chest, lungs feel tight, runny nose 10. PREGNANCY: "Is there any chance you are pregnant?" "When was your last menstrual period?" n/a 11. TRAVEL: "Have you traveled out of the country in the last month?" (e.g., travel history, exposures)    no  Protocols used: BREATHING DIFFICULTY-A-AH

## 2018-09-03 ENCOUNTER — Ambulatory Visit: Payer: Federal, State, Local not specified - PPO | Admitting: Family Medicine

## 2018-09-03 ENCOUNTER — Encounter: Payer: Self-pay | Admitting: Family Medicine

## 2018-09-03 VITALS — BP 140/70 | HR 98 | Temp 98.6°F | Wt 193.2 lb

## 2018-09-03 DIAGNOSIS — R059 Cough, unspecified: Secondary | ICD-10-CM

## 2018-09-03 DIAGNOSIS — R6883 Chills (without fever): Secondary | ICD-10-CM

## 2018-09-03 DIAGNOSIS — J181 Lobar pneumonia, unspecified organism: Secondary | ICD-10-CM

## 2018-09-03 DIAGNOSIS — R05 Cough: Secondary | ICD-10-CM | POA: Diagnosis not present

## 2018-09-03 DIAGNOSIS — J189 Pneumonia, unspecified organism: Secondary | ICD-10-CM

## 2018-09-03 LAB — POCT INFLUENZA A/B
INFLUENZA A, POC: NEGATIVE
INFLUENZA B, POC: NEGATIVE

## 2018-09-03 MED ORDER — DOXYCYCLINE HYCLATE 100 MG PO CAPS: 100.0000 mg | ORAL_CAPSULE | Freq: Two times a day (BID) | ORAL | 0 refills | Status: AC

## 2018-09-03 MED ORDER — ALBUTEROL SULFATE HFA 108 (90 BASE) MCG/ACT IN AERS: 2.0000 | INHALATION_SPRAY | Freq: Four times a day (QID) | RESPIRATORY_TRACT | 0 refills | Status: AC | PRN

## 2018-09-03 MED ORDER — ALBUTEROL SULFATE (2.5 MG/3ML) 0.083% IN NEBU
2.5000 mg | INHALATION_SOLUTION | Freq: Once | RESPIRATORY_TRACT | Status: AC
  Administered 2018-09-03: 2.5 mg via RESPIRATORY_TRACT

## 2018-09-03 MED ORDER — E-Z SPACER DEVI: 2 refills | Status: AC

## 2018-09-03 MED ORDER — METHYLPREDNISOLONE ACETATE 80 MG/ML IJ SUSP
80.0000 mg | Freq: Once | INTRAMUSCULAR | Status: AC
  Administered 2018-09-03: 80 mg via INTRAMUSCULAR

## 2018-09-03 MED ORDER — ALBUTEROL SULFATE (2.5 MG/3ML) 0.083% IN NEBU: 2.5000 mg | INHALATION_SOLUTION | Freq: Four times a day (QID) | RESPIRATORY_TRACT | 12 refills | Status: DC | PRN

## 2018-09-03 NOTE — Progress Notes (Signed)
Hector [name] DOB: 25-Jul-1961 Encounter date: 09/03/2018  This is a 57 y.o. male who presents with Chief Complaint  Patient presents with  . Cough    SOB x 3 days, rib pain when coughing, broken rib 3 weeks ago, productive cough, green mucus, unable to do daily activities without becoming winded, sore throat, head and chest congestion, runny nose, drainage, no know exposure to flu    History of present illness: Broke rib 8-9 weeks ago which has been healing well, but entire rib cage hurts with coughing. Productive cough and drainage - greenish-yellowish. Had family in over holidays. Even just getting [place] breasts prepared has winded him significantly. Has had fevers, chill, joint aches. Hasn't had flu shot this year yet. Is EMT. Not had this sort of shortness of breath before. Has had some cough going on for 3-4 weeks. Was thinking about coming in prior to this.   Has been achy, chills now for last couple of days.   Not significant sinus pain/pressure.     Allergies  Allergen Reactions  . Aspirin Other (See Comments)    perforated ulcer  . Cymbalta [Duloxetine Hcl] Other (See Comments)    Over-sedating; slept over 24 hours with one dose.  Possible serotonin syndrome.  . Tape Rash    Surgical tape  . Nsaids Other (See Comments)    ulcer Other reaction(s): Other (See Comments) Perforated ulcer  . Hydrocodone Other (See Comments)    headache  . Vancomycin Rash    Red man syndrome.  Please premedicate.   Current Meds  Medication Sig  . acetaminophen (TYLENOL ARTHRITIS PAIN) 650 MG CR tablet Take 650 mg by mouth every 8 (eight) hours as needed for pain.  . Cholecalciferol (VITAMIN D-1000 MAX ST) 1000 units tablet Take 1,000 Units by mouth daily.  [name] escitalopram (LEXAPRO) 20 MG tablet Take 20 mg by mouth every morning.   . lamoTRIgine (LAMICTAL) 100 MG tablet Take 100-200 mg by mouth 2 (two) times daily. Take 200 mg in the am and 100 mg qhs.  . lisinopril (PRINIVIL,ZESTRIL) 10 MG  tablet Take 1 tablet (10 mg total) by mouth daily. May increase to 20mg  if needed. Send BP readings in 1 month.  . Multiple Vitamin (MULTIVITAMIN) tablet Take 1 tablet by mouth daily.  [name] oxyCODONE-acetaminophen (PERCOCET) 10-325 MG tablet Take 1 tablet by mouth every 6 (six) hours as needed for pain.  . predniSONE (DELTASONE) 10 MG tablet Take 3 tablets once a day for 5 days  . tamsulosin (FLOMAX) 0.4 MG CAPS capsule Take 1 capsule (0.4 mg total) by mouth daily.  . traZODone (DESYREL) 100 MG tablet Take 300 mg by mouth at bedtime.   . vitamin B-12 (CYANOCOBALAMIN) 1000 MCG tablet Take 1,000 mcg by mouth daily.    Review of Systems  Constitutional: Positive for chills and fever.  HENT: Positive for congestion, postnasal drip and sore throat. Negative for ear pain, sinus pressure and sinus pain.   Respiratory: Positive for cough and shortness of breath. Negative for wheezing.   Cardiovascular: Positive for chest pain (with deep breathes, coughing although rib pain has resolved for most part).  Musculoskeletal: Positive for arthralgias.  Neurological: Positive for headaches.    Objective:  BP 140/70 (BP Location: Right Arm, Patient Position: Sitting, Cuff Size: Normal)   Pulse 98   Temp 98.6 F (37 C) (Oral)   Wt 193 lb 3.2 oz (87.6 kg)   SpO2 97%   BMI 27.72 kg/m   Weight: 193 lb  3.2 oz (87.6 kg)   BP Readings from Last 3 Encounters:  09/03/18 140/70  07/10/18 131/81  07/05/18 (!) 153/89   Wt Readings from Last 3 Encounters:  09/03/18 193 lb 3.2 oz (87.6 kg)  07/10/18 194 lb 0.1 oz (88 kg)  07/05/18 195 lb (88.5 kg)    Physical Exam Constitutional:      Appearance: He is well-developed. He is ill-appearing. He is not toxic-appearing.  HENT:     Right Ear: Tympanic membrane, ear canal and external ear normal.     Left Ear: Tympanic membrane, ear canal and external ear normal.     Nose: Mucosal edema present.     Right Sinus: No maxillary sinus tenderness or frontal sinus  tenderness.     Left Sinus: No maxillary sinus tenderness or frontal sinus tenderness.     Mouth/Throat:     Pharynx: Uvula midline.  Cardiovascular:     Rate and Rhythm: Normal rate and regular rhythm.     Heart sounds: Normal heart sounds.  Pulmonary:     Effort: Pulmonary effort is normal.     Breath sounds: Decreased air movement present. Examination of the left-lower field reveals rales. Decreased breath sounds (diffuse), rhonchi and rales present. No wheezing.     Comments: Rales only apparent after albuterol neb in the office. There is improved air exchange after albuterol neb with clear rales localized to LLL.  Chest:     Comments: There is not tenderness to palpation left lateral or anterior rib cage where he had known fracture over 2 months ago.    Assessment/Plan  1. Cough Improved air exchange after albuterol. Will write for inhaler for home use. Discussed new medication(s) today with patient. Discussed potential side effects and patient verbalized understanding. Reviewed use with spacing chamber. - PR INHAL RX, AIRWAY OBST/DX SPUTUM INDUCT - albuterol (PROVENTIL) (2.5 MG/3ML) 0.083% nebulizer solution 2.5 mg - albuterol (PROVENTIL HFA;VENTOLIN HFA) 108 (90 Base) MCG/ACT inhaler; Inhale 2 puffs into the lungs every 6 (six) hours as needed for wheezing or shortness of breath.  Dispense: 1 Inhaler; Refill: 0 - Spacer/Aero-Holding Chambers (E-Z SPACER) inhaler; Use as instructed  Dispense: 1 each; Refill: 2 - methylPREDNISolone acetate (DEPO-MEDROL) injection 80 mg  2. Chills Negative flu testing.  - POC Influenza A/B  3. Pneumonia of left lower lobe due to infectious organism [facility]) Pneumonia apparent after neb treatment. Tx with doxycycline. Let us know if any worsening of sx or if not feeling near baseline in 2 weeks time.  - doxycycline (VIBRAMYCIN) 100 MG capsule; Take 1 capsule (100 mg total) by mouth 2 (two) times daily for 7 days.  Dispense: 14 capsule; Refill:  0    Return if symptoms worsen or fail to improve.    Micheline Rough, MD

## 2018-09-03 NOTE — Patient Instructions (Signed)
Use the albuterol inhaler 2-3 times daily for next couple of days to keep breathing on track. After that you can use just as needed.   Complete antibiotic as directed. Follow back up in office if not feeling back to "normal" in 2 weeks.

## 2018-09-06 ENCOUNTER — Ambulatory Visit
Admission: RE | Admit: 2018-09-06 | Discharge: 2018-09-06 | Disposition: A | Discharge status: Home / Self Care | Payer: Federal, State, Local not specified - PPO | Source: Ambulatory Visit | Attending: General Surgery | Admitting: General Surgery

## 2018-09-06 DIAGNOSIS — K862 Cyst of pancreas: Secondary | ICD-10-CM

## 2018-09-06 MED ORDER — GADOBENATE DIMEGLUMINE 529 MG/ML IV SOLN
18.0000 mL | Freq: Once | INTRAVENOUS | Status: AC | PRN
  Administered 2018-09-06: 18 mL via INTRAVENOUS

## 2018-09-10 ENCOUNTER — Ambulatory Visit: Payer: Self-pay | Admitting: *Deleted

## 2018-09-10 NOTE — Telephone Encounter (Signed)
Message from Bea Graff, [place] sent at 09/10/2018 4:45 PM EST   Summary: pneumonia not getting better    Pts wife states pt was seen last week for pneumonia and he has now finished his meds and is now feeling bad again. Wife states that he is feeling weak, cough being productive again, and congested in chest. She would like to see if more meds can be ordered.          Returned call to patient regarding needing more antibiotics for his treatment. He was seen in the office by Dr. Atilano Median on 12/20. He was prescribed antibiotics. He finished his antibiotics today. He said that he had started to feel better but now thinks he is where he was when he first went in to see a provider. He stated his feels winded, felt like he had a fever (no thermometer), productive cough (swallowing the sputum), and sore throat. He wants to see or speak to the provider that saw him in the office. He would like a call back on Monday from Dr. Ethlyn Gallery. Urgent care or ED recommended for increase in symptoms. He will go if he feels like he is getting worst before Monday Routing to flow at [place] at [place].       Reason for Disposition . [1] Caller has NON-URGENT question AND [2] triager unable to answer question  Answer Assessment - Initial Assessment Questions 1. INFECTION: "What infection is the antibiotic being given for?"     pneumonia 2. ANTIBIOTIC: "What antibiotic are you taking" "How many times per day?"     doxycycline 3. DURATION: "When was the antibiotic started?"     09/03/18 4. MAIN CONCERN OR SYMPTOM:  "What is your main concern right now?"     Productive cough 5. BETTER-SAME-WORSE: "Are you getting better, staying the same, or getting worse compared to when you first started the antibiotics?" If getting worse, ask: "In what way?"      Felt better now starting to feel bad again 6. FEVER: "Do you have a fever?" If so, ask: "What is your temperature, how was it measured, and when did it  start?"     Does not have a thermometer. But felt like he had a fever. 7. SYMPTOMS: "Are there any other symptoms you're concerned about?" If so, ask: "When did it start?"    Throat becoming sore again, feeling winded 8. FOLLOW-UP APPOINTMENT: "Do you have a follow-up appointment with your doctor?"     no  Protocols used: INFECTION ON ANTIBIOTIC FOLLOW-UP CALL-A-AH

## 2018-09-12 NOTE — Progress Notes (Signed)
Please let patient know this cyst looks the same as in 2012.  Looks benign.  Does not need routine imaging, but if patient has pain we would repeat scan.

## 2018-09-13 ENCOUNTER — Other Ambulatory Visit: Payer: Self-pay | Admitting: Internal Medicine

## 2018-09-13 DIAGNOSIS — J181 Lobar pneumonia, unspecified organism: Principal | ICD-10-CM

## 2018-09-13 DIAGNOSIS — J189 Pneumonia, unspecified organism: Secondary | ICD-10-CM

## 2018-09-13 NOTE — Telephone Encounter (Signed)
reviewed  Record    Needs Ov to decide on next step. with chest x ray  Please  Order this stat  Reading    There are no  Openings today or tomorrow but I could work him in   Around  845 tomorrow  And get chest x ray

## 2018-09-13 NOTE — Telephone Encounter (Signed)
Pt declined appt.. He states that he went to another office and got antibiotics from another physician. Pt states he was very disappointed that no one handled it Friday even though he states " it was late in the day" when he called and that he does not want to come in for a chest xray or appt. If these antibiotics don't help he will go to the ED. I Told the Pt we were sorry that we did not get back to him Friday but if he needed anything else to give [place] a call.

## 2018-09-13 NOTE — Telephone Encounter (Signed)
X-ray has been ordered

## 2018-09-13 NOTE — Telephone Encounter (Signed)
Please advise Dr. Ethlyn Gallery out of office. Thank you, Kerrin Champagne., RMA

## 2018-09-14 ENCOUNTER — Ambulatory Visit: Payer: Federal, State, Local not specified - PPO | Admitting: Internal Medicine

## 2018-09-18 ENCOUNTER — Emergency Department: Payer: Federal, State, Local not specified - PPO

## 2018-09-18 ENCOUNTER — Emergency Department
Admission: EM | Admit: 2018-09-18 | Discharge: 2018-09-19 | Disposition: A | Discharge status: Home / Self Care | Payer: Federal, State, Local not specified - PPO | Attending: Emergency Medicine | Admitting: Emergency Medicine

## 2018-09-18 ENCOUNTER — Encounter: Payer: Self-pay | Admitting: Radiology

## 2018-09-18 ENCOUNTER — Other Ambulatory Visit: Payer: Self-pay

## 2018-09-18 DIAGNOSIS — Z9884 Bariatric surgery status: Secondary | ICD-10-CM | POA: Insufficient documentation

## 2018-09-18 DIAGNOSIS — I10 Essential (primary) hypertension: Secondary | ICD-10-CM | POA: Diagnosis not present

## 2018-09-18 DIAGNOSIS — J189 Pneumonia, unspecified organism: Secondary | ICD-10-CM | POA: Diagnosis not present

## 2018-09-18 DIAGNOSIS — R0602 Shortness of breath: Secondary | ICD-10-CM | POA: Diagnosis not present

## 2018-09-18 DIAGNOSIS — I2699 Other pulmonary embolism without acute cor pulmonale: Secondary | ICD-10-CM | POA: Diagnosis not present

## 2018-09-18 DIAGNOSIS — I2694 Multiple subsegmental pulmonary emboli without acute cor pulmonale: Secondary | ICD-10-CM | POA: Insufficient documentation

## 2018-09-18 DIAGNOSIS — R000 Tachycardia, unspecified: Secondary | ICD-10-CM | POA: Diagnosis not present

## 2018-09-18 LAB — CBC
HEMATOCRIT: 49.2 % (ref 39.0–52.0)
HEMOGLOBIN: 16.8 g/dL (ref 13.0–17.0)
MCH: 35.1 pg — AB (ref 26.0–34.0)
MCHC: 34.1 g/dL (ref 30.0–36.0)
MCV: 102.7 fL — ABNORMAL HIGH (ref 80.0–100.0)
Platelets: 203 10*3/uL (ref 150–400)
RBC: 4.79 MIL/uL (ref 4.22–5.81)
RDW: 13.4 % (ref 11.5–15.5)
WBC: 8.9 10*3/uL (ref 4.0–10.5)
nRBC: 0 % (ref 0.0–0.2)

## 2018-09-18 LAB — BASIC METABOLIC PANEL
ANION GAP: 11 (ref 5–15)
BUN: 16 mg/dL (ref 6–20)
CHLORIDE: 105 mmol/L (ref 98–111)
CO2: 17 mmol/L — ABNORMAL LOW (ref 22–32)
CREATININE: 1.59 mg/dL — AB (ref 0.61–1.24)
Calcium: 8.7 mg/dL — ABNORMAL LOW (ref 8.9–10.3)
GFR, EST AFRICAN AMERICAN: 55 mL/min — AB (ref >60)
GFR, EST NON AFRICAN AMERICAN: 47 mL/min — AB (ref >60)
GLUCOSE: 175 mg/dL — AB (ref 70–99)
Potassium: 3.3 mmol/L — ABNORMAL LOW (ref 3.5–5.1)
Sodium: 133 mmol/L — ABNORMAL LOW (ref 135–145)

## 2018-09-18 LAB — TROPONIN I: Troponin I: <0.03 ng/mL (ref <0.03)

## 2018-09-18 MED ORDER — IOHEXOL 350 MG/ML SOLN: 75.0000 mL | Freq: Once | INTRAVENOUS | Status: DC | PRN

## 2018-09-18 MED ORDER — IOHEXOL 350 MG/ML SOLN
75.0000 mL | Freq: Once | INTRAVENOUS | Status: AC | PRN
  Administered 2018-09-18: 75 mL via INTRAVENOUS

## 2018-09-18 NOTE — ED Triage Notes (Signed)
Currently being treated for pneumonia.  Has had symptoms for several weeks.  Reports still not feeling any better.  Reports short of breath worse with exertion.

## 2018-09-18 NOTE — ED Notes (Signed)
Pt's daughter to desk to inquire about wait time. Daughter updated on time and situation.

## 2018-09-18 NOTE — ED Provider Notes (Signed)
[facility] Emergency Department Provider Note  ____________________________________________   First MD Initiated Contact with Patient 09/18/18 2313     (approximate)  I have reviewed the triage vital signs and the nursing notes.   HISTORY  Chief Complaint Shortness of Breath    HPI Hector [name] is a 58 y.o. male with medical history as listed below which notably does not include any pulmonary history or smoking history nor cardiac history.  He is generally active and healthy.  He presents for evaluation of worsening shortness of breath most notable on exertion.  He states that about 3 weeks ago he became ill with what was clinically diagnosed as pneumonia by his primary care doctor.  He took a 10 to 14-day course of doxycycline and received a steroid injection.  He states that he felt better although the symptoms did not completely go away and then once the medication was gone he got worse again.  He was then started on a course of Bactrim by a different doctor and had a 5-day course of prednisone.  Once again he felt a little bit better but then felt worse within the last couple days once the medications were finished.  Today he felt increasingly short of breath with exertion and just walking to and from a restaurant for dinner caused him to become diaphoretic and out of breath.  He describes the symptoms as severe with exertion and even at rest he is slightly short of breath.  He denies any episodes of chest pain.  He has not had any swelling in his legs.  He denies recent fever/chills although he did have some chills when he first became ill 3 weeks ago.  He denies nasal congestion, nausea, vomiting, abdominal pain, and dysuria.  He has had no swelling in his legs and no unilateral leg pain.  He has been on no long trips recently and has had no immobilizations or surgeries.  He reports having a history of some sort of blood or clotting disorder but he is not  certain of the details.  He is not on any long-term anticoagulation and is unclear what sort of disorder he had and the hematologist did not give him a specific diagnosis.  He has not had blood clots in the legs of the lungs of which he is aware.  Nothing in particular other than rest makes his symptoms better and exertion seems to make him worse.  He has had some cough but it is nonproductive.  He has had no recent medication changes.  Past Medical History:  Diagnosis Date  . Abnormal LFTs    improved after gastic bypass  . ADJ DISORDER WITH MIXED ANXIETY & DEPRESSED MOOD 08/23/2009   Qualifier: Diagnosis of  By: [name] MD, Standley Brooking Now on lamictal and lexapro and elevil for sleep.   . Anginal pain ([place])   . Anxiety   . B12 nutritional deficiency   . Chest pain    hopsitalized 2 14 evaluation  . Closed head injury    laceration  9 12  no loc  vision change  . DDD (degenerative disc disease)   . Diabetes mellitus    resolved with gastric bypass  . DIABETES MELLITUS, TYPE II 04/06/2007   Qualifier: Diagnosis of  By: [name] MD, Standley Brooking Improved and off meds since gastric bypass   . GERD (gastroesophageal reflux disease)    no meds  . Gout   . Headache    once per  week uses Indomethicin hx of cluster headaches   . History of bunionectomy of left great toe   . History of concussion   . History of renal stone    Seen in emergency room not urologist  . Hyperlipidemia   . Hypertension 02/27/2017  . OBESITY 12/10/2007   Qualifier: Diagnosis of  By: [name] MD, Standley Brooking Gastric bypass   . OBSTRUCTIVE SLEEP APNEA 07/22/2007   Qualifier: Diagnosis of  By: [name] RN, Doroteo Bradford    . OSA (obstructive sleep apnea) 12/12/2013  . Pancreatitis 03/21/2016   went to [place]  . Perforated ulcer ([place])    Surgical repair9/24 2011  . Sleep apnea    had gastric bypass/ sa-gone  . 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 Hypertension 02/27/2017    . Acute gastroenteritis 11/24/2016  . Sepsis ([place]) 11/24/2016  . AKI (acute kidney injury) ([place]) 11/24/2016  . Hypokalemia 11/24/2016  . Chronic low back pain 11/24/2016  . Pancreatitis 03/19/2016  . Renal calculi   . 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 Type II or unspecified type diabetes mellitus without mention of complication, not stated as uncontrolled 05/15/2014  . Lumbar back pain with radiculopathy affecting left lower extremity 05/15/2014  . Medication management 05/15/2014  . Pre-op evaluation 05/15/2014  . Insufficient treatment with nasal CPAP 04/24/2014  . 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 Hypoxemia 04/24/2014  . OSA (obstructive sleep apnea) 12/12/2013  . 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 History of renal stone   . Colon cancer screening 12/28/2012  . GERD (gastroesophageal reflux disease) 12/28/2012  . Visit for preventive health examination 09/06/2012  . History of gout 09/06/2012  . Easy bruising 03/24/2012  . Headaches due to old head injury 03/24/2012  . Insomnia 03/24/2012  . BACK PAIN WITH RADICULOPATHY 10/07/2010  . Thrombocytopenia ([place]) 05/24/2010  . ADJ DISORDER WITH MIXED ANXIETY & DEPRESSED MOOD 08/23/2009  . BACK PAIN 10/20/2008  . UNS ADVRS EFF OTH RX MEDICINAL&BIOLOGICAL SBSTNC 02/04/2008  . GOUT 08/02/2007  . ASTHMA 07/22/2007  . B12 deficiency 04/06/2007  . CARPAL TUNNEL SYNDROME 04/06/2007    Past Surgical History:  Procedure Laterality Date  . BACK SURGERY  2012   Dr. Dorina Hoyer disk  . CARPAL TUNNEL RELEASE  2008   left-ulnar nerve decomp  . carpel tunnel Left   . CHOLECYSTECTOMY N/A 03/21/2016   Procedure: LAPAROSCOPIC CHOLECYSTECTOMY WITH INTRAOPERATIVE CHOLANGIOGRAM;  Surgeon: Ralene Ok, MD;  Location: [facility];  Service: General;  Laterality: N/A;  . colonscopy     . EPIDURAL BLOCK INJECTION     by Dr. Nelva Bush  . FOOT SURGERY     bilat for removal of  extra bone   . GASTRIC BYPASS  11/06/09   beginning weight 267  . HARDWARE REMOVAL N/A 05/02/2016   Procedure: Removal of hardware Lumbar five-Sacral one with Metrex;  Surgeon: Kristeen Miss, MD;  Location: [place] [facility];  Service: Neurosurgery;  Laterality: N/A;  Removal of hardware L5-S1 with Metrex  . HERNIA REPAIR  5681   umbilical  . HIATAL HERNIA REPAIR N/A 01/20/2013   Procedure: LAPAROSCOPIC REPAIR OF INTERNAL HERNIA;  Surgeon: Shann Medal, MD;  Location: [facility];  Service: General;  Laterality: N/A;  . LUMBAR LAMINECTOMY/DECOMPRESSION MICRODISCECTOMY Left 06/07/2014   Procedure: MICRO LUMBAR DECOMPRESSION L5 - S1 ON THE LEFT/L5 FORAMINOTOMY/EXCISION SYNOVIAL CYST  1 LEVEL;  Surgeon: Doroteo Bradford  Tonita Cong, MD;  Location: [facility];  Service: Orthopedics;  Laterality: Left;  [name] MASS EXCISION Right 04/18/2013   Procedure: RIGHT SMALL FINGER SKIN LESION EXCISION;  Surgeon: Jolyn Nap, MD;  Location: [facility];  Service: Orthopedics;  Laterality: Right;  . NASAL SEPTOPLASTY W/ TURBINOPLASTY     x 2  . NEPHROLITHOTOMY Right 11/03/2014   Procedure: RIGHT PERCUTANEOUS NEPHROLITHOTOMY;  Surgeon: Claybon Jabs, MD;  Location: [facility];  Service: Urology;  Laterality: Right;  . SHOULDER ARTHROSCOPY  2014   rt  . surgerical repair of stomach ulcer  06/08/10   per  . TONSILLECTOMY    . UVULOPALATOPHARYNGOPLASTY  2007   with tonsils  . VASECTOMY      Prior to Admission medications   Medication Sig Start Date End Date Taking? Authorizing Provider  acetaminophen (TYLENOL ARTHRITIS PAIN) 650 MG CR tablet Take 650 mg by mouth every 8 (eight) hours as needed for pain.    [provider]  albuterol (PROVENTIL HFA;VENTOLIN HFA) 108 (90 Base) MCG/ACT inhaler Inhale 2 puffs into the lungs every 6 (six) hours as needed for wheezing or shortness of breath. 09/03/18   Caren Macadam, MD  apixaban (ELIQUIS) 5 MG TABS tablet Take 2 tablets (10 mg) PO BID x 7 days, then reduce dose to 1  tablet (5 mg) PO BID. 09/19/18   Hinda Kehr, MD  Cholecalciferol (VITAMIN D-1000 MAX ST) 1000 units tablet Take 1,000 Units by mouth daily.    [provider]  escitalopram (LEXAPRO) 20 MG tablet Take 20 mg by mouth every morning.     [provider]  lamoTRIgine (LAMICTAL) 100 MG tablet Take 100-200 mg by mouth 2 (two) times daily. Take 200 mg in the am and 100 mg qhs.    [provider]  lisinopril (PRINIVIL,ZESTRIL) 10 MG tablet Take 1 tablet (10 mg total) by mouth daily. May increase to 20mg  if needed. Send BP readings in 1 month. 12/29/17   Panosh, Standley Brooking, MD  Multiple Vitamin (MULTIVITAMIN) tablet Take 1 tablet by mouth daily.    [provider]  oxyCODONE-acetaminophen (PERCOCET) 10-325 MG tablet Take 1 tablet by mouth every 6 (six) hours as needed for pain. 07/10/18 07/10/19  [name], PA-C  predniSONE (DELTASONE) 10 MG tablet Take 3 tablets once a day for 5 days 07/10/18   [name], PA-C  Spacer/Aero-Holding Chambers (E-Z SPACER) inhaler Use as instructed 09/03/18   Caren Macadam, MD  tamsulosin (FLOMAX) 0.4 MG CAPS capsule Take 1 capsule (0.4 mg total) by mouth daily. 10/29/17   Stoioff, Ronda Fairly, MD  traZODone (DESYREL) 100 MG tablet Take 300 mg by mouth at bedtime.     [provider]  vitamin B-12 (CYANOCOBALAMIN) 1000 MCG tablet Take 1,000 mcg by mouth daily.    [provider]    Allergies Aspirin; Cymbalta [duloxetine hcl]; Tape; Nsaids; Hydrocodone; and Vancomycin  Family History  Problem Relation Age of Onset  . Uterine cancer Mother   . Hypertension Mother   . Cervical cancer Mother   . Heart disease Father   . COPD Father   . Colon cancer Maternal Uncle   . Colon cancer Paternal Aunt     Social History Social History   Tobacco Use  . Smoking status: Never Smoker  . Smokeless tobacco: Never Used  Substance Use Topics  . Alcohol use: Yes    Comment: occas liquor   . Drug use: No  Review of Systems Constitutional: No fever/chills.  General malaise and fatigue. Eyes: No visual changes. ENT: No sore throat. Cardiovascular: Denies chest pain. Respiratory: Shortness of breath primarily with exertion. Gastrointestinal: No abdominal pain.  No nausea, no vomiting.  No diarrhea.  No constipation. Genitourinary: Negative for dysuria. Musculoskeletal: Negative for neck pain.  Negative for back pain. Integumentary: Negative for rash. Neurological: Negative for headaches, focal weakness or numbness.   ____________________________________________   PHYSICAL EXAM:  VITAL SIGNS: ED Triage Vitals  Enc Vitals Group     BP 09/18/18 1909 116/76     Pulse Rate 09/18/18 1909 89     Resp 09/18/18 1909 20     Temp 09/18/18 1909 98.3 F (36.8 C)     Temp Source 09/18/18 1909 Oral     SpO2 09/18/18 1909 97 %     Weight --      Height 09/18/18 1910 1.778 m (5\' 10" )     Head Circumference --      Peak Flow --      Pain Score 09/18/18 1910 7     Pain Loc --      Pain Edu? --      Excl. in [place]? --     Constitutional: Alert and oriented.  Generally well-appearing but he has increased respiratory effort just lying in bed and not exerting himself. Eyes: Conjunctivae are normal.  Head: Atraumatic. Nose: No congestion/rhinnorhea. Mouth/Throat: Mucous membranes are moist. Neck: No stridor.  No meningeal signs.   Cardiovascular: Normal rate, regular rhythm. Good peripheral circulation. Grossly normal heart sounds. Respiratory: Mildly increased respiratory effort.  He is not using accessory muscles but his effort seems increased by taking bigger breaths than usual even at rest.  He has the appearance of someone who has been exerting himself even though he is just been lying in the exam room bed.  His lungs are clear to auscultation throughout the lung fields with no wheezing and good air movement throughout. Gastrointestinal: Soft and nontender. No distention.  Musculoskeletal:  No lower extremity tenderness nor edema. No gross deformities of extremities. Neurologic:  Normal speech and language. No gross focal neurologic deficits are appreciated.  Skin:  Skin is warm, dry and intact. No rash noted. Psychiatric: Mood and affect are normal. Speech and behavior are normal.  ____________________________________________   LABS (all labs ordered are listed, but only abnormal results are displayed)  Labs Reviewed  BASIC METABOLIC PANEL - Abnormal; Notable for the following components:      Result Value   Sodium 133 (*)    Potassium 3.3 (*)    CO2 17 (*)    Glucose, Bld 175 (*)    Creatinine, Ser 1.59 (*)    Calcium 8.7 (*)    GFR calc non Af Amer 47 (*)    GFR calc Af Amer 55 (*)    All other components within normal limits  CBC - Abnormal; Notable for the following components:   MCV 102.7 (*)    MCH 35.1 (*)    All other components within normal limits  TROPONIN I  BRAIN NATRIURETIC PEPTIDE  PROTIME-INR  APTT   ____________________________________________  EKG  ED ECG REPORT I, Hinda Kehr, the attending physician, personally viewed and interpreted this ECG.  Date: 09/18/2018 EKG Time: 18: 57 Rate: 100 Rhythm: Borderline sinus tachycardia QRS Axis: Left axis deviation Intervals: Some LVH, intervals within normal limits ST/T Wave abnormalities: Non-specific ST segment / T-wave changes, but no evidence of acute ischemia. Narrative Interpretation:  no definitive evidence of acute ischemia   ____________________________________________  RADIOLOGY I, Hinda Kehr, personally viewed and evaluated these images (plain radiographs) as part of my medical decision making, as well as reviewing the written report by the radiologist.  ED MD interpretation: Chest x-ray is similar to prior exam with no new airspace opacity.  CTA chest demonstrates multiple small subsegmental PEs in the right lung with no evidence of cor pulmonale.  Official r adiology  report(s): Dg Chest 2 View  Result Date: 09/18/2018 CLINICAL DATA:  Pneumonia EXAM: CHEST - 2 VIEW COMPARISON:  07/05/2018 FINDINGS: Cardiac and mediastinal margins appear normal. Thoracic spondylosis. Left lower lateral rib deformities from prior fractures. Mild scarring or atelectasis in the left lower lobe and lingula. The lungs appear otherwise clear. IMPRESSION: 1. Minimal left lower lobe and lingular scarring or atelectasis. This is similar to prior exams. 2. No new airspace opacity. 3. Thoracic spondylosis. Electronically Signed   By: Van Clines M.D.   On: 09/18/2018 19:47   Ct Angio Chest Pe W/cm &/or Wo Cm  Result Date: 09/19/2018 CLINICAL DATA:  Initial evaluation for acute dyspnea. Shortness of breath. EXAM: CT ANGIOGRAPHY CHEST WITH CONTRAST TECHNIQUE: Multidetector CT imaging of the chest was performed using the standard protocol during bolus administration of intravenous contrast. Multiplanar CT image reconstructions and MIPs were obtained to evaluate the vascular anatomy. CONTRAST:  47mL OMNIPAQUE IOHEXOL 350 MG/ML SOLN COMPARISON:  Prior radiograph from earlier the same day. FINDINGS: Cardiovascular: Intrathoracic aorta of normal caliber without aneurysm or other acute finding. Mild atherosclerosis within the aortic arch. Visualized great vessels within normal limits. Heart size normal. No pericardial effusion. Pulmonary arterial tree adequately opacified for evaluation. Main pulmonary artery measures within normal limits for caliber. Focal filling defect within right lower lobe segmental arterial branch, consistent with acute pulmonary embolism (series 7, image 161). Few additional tiny filling defects noted distally within right lower lobe subsegmental branches. Probable minimal thrombus within anterior right upper lobe segmental branch (series 7, image 101). No other significant filling defects identified. RV to LV ratio within normal limits without evidence for right heart strain.  Mediastinum/Nodes: Visualized thyroid within normal limits. No pathologically enlarged mediastinal, hilar, or axillary lymph nodes identified. Small amount of layering fluid noted within the upper esophageal lumen. Small hiatal hernia noted. Lungs/Pleura: Tracheobronchial tree intact and patent. Lungs well inflated bilaterally. Mild linear atelectasis and/or scarring noted within the lingula and left lower lobe. No consolidative opacity to suggest pneumonia. No pulmonary edema or pleural effusion. No pneumothorax. No worrisome pulmonary nodule or mass. Upper Abdomen: Calcified granuloma noted within the posterior right hepatic lobe. Hepatic steatosis noted. 17 mm hypodensity within the central aspect of the liver suggestive of a small cyst. Liver otherwise unremarkable. Sequelae of prior gastric bypass noted. Small hiatal hernia. Remainder the visualized upper abdomen otherwise unremarkable. Musculoskeletal: No acute osseous abnormality. No lytic or blastic osseous lesions. Subacute healing fractures of the left lateral seventh, eighth, and ninth ribs noted. External soft tissues unremarkable. Review of the MIP images confirms the above findings. IMPRESSION: 1. Acute small volume pulmonary embolism involving primarily right lower lobe segmental arterial branch. No evidence for right heart strain. 2. No other acute cardiopulmonary abnormality identified. 3. Subacute healing fractures of the left lateral seventh, eighth, and ninth ribs. 4. Hepatic steatosis. 5. Small hiatal hernia. Critical Value/emergent results were called by telephone at the time of interpretation on 09/19/2018 at 12:22 am to Dr. Hinda Kehr , who verbally acknowledged these results. Electronically Signed  By: Jeannine Boga M.D.   On: 09/19/2018 00:26    ____________________________________________   PROCEDURES  Critical Care performed: No   Procedure(s) performed:    Procedures   ____________________________________________   INITIAL IMPRESSION / ASSESSMENT AND PLAN / ED COURSE  As part of my medical decision making, I reviewed the following data within the [place] History obtained from family, Nursing notes reviewed and incorporated, Labs reviewed , EKG interpreted , Old chart reviewed, Radiograph reviewed , Discussed with radiologist and Notes from prior ED visits    Differential diagnosis includes, but is not limited to, persistent viral bronchitis, persistent community-acquired pneumonia, new diagnosis of heart failure, pulmonary embolism, ACS, worsening aortic aneurysm or dissection.  The patient has had symptoms for a few weeks but it is concerning that he is dyspneic at rest in spite of clear lungs.  He has been on 2 courses of antibiotics now even though Bactrim would not be my preference for treatment of community-acquired pneumonia and 2 rounds of steroids without significant improvement or resolution.  He has no peripheral edema which is somewhat reassuring and there is no obvious sign of fluid on his chest x-ray.  Given his persistent symptoms without a definitive diagnosis, as well as the nonspecific history of a clotting or bleeding disorder, the details of which the patient cannot provide, I will obtain a CTA chest to rule out pulmonary embolism but also to look for signs of right heart strain and failure as well as to look at the lung parenchyma for signs of pneumonia or edema that are not visible on the chest x-ray alone.  I discussed this extensively with the patient and his daughter.  His lab work is reassuring.  His creatinine is roughly at baseline at 1.59 and his GFR should be appropriate for IV contrast.  He has a slightly decreased sodium and potassium but neither of which explain his symptoms.  CBC is within normal limits with a normal hemoglobin and normal white blood cell count.  Troponin is negative.  I have  added on a BNP. Clinical Course as of Sep 19 45  Sat Sep 18, 2018  2355 EKG has some nonspecific changes compared to his prior EKG but without any obvious sign of ischemia.   [CF]  Sun Sep 19, 2018  0039 B Natriuretic Peptide: 49.0 [CF]  [phone] The radiologist called to discuss the results with me.  The patient has a small area in his lung of pulmonary embolus, but there is no evidence of right heart strain and again the area is relatively small.  I updated the patient and he agrees with the plan to start on Eliquis and follow-up as an outpatient.  He is in the process of switching care from a primary care doctor in [place] to [facility] and I will give him the name and number of hematology locally so that he can follow-up for additional work-up as they deem fit.  I gave strict return precautions and he understands and agrees with the plan.  CT Angio Chest PE W/Cm &/Or Wo Cm [CF]  0042 Of note, I did offer some IV fluids of the patient given his elevated creatinine at baseline but he is not having any trouble taking oral liquids and says it "I will take care of that myself" meaning that he will stay hydrated and drink plenty of fluids.   [CF]    Clinical Course User Index [CF] Hinda Kehr, MD    ____________________________________________  FINAL CLINICAL IMPRESSION(S) / ED DIAGNOSES  Final diagnoses:  Multiple subsegmental pulmonary emboli without acute cor pulmonale     MEDICATIONS GIVEN DURING THIS VISIT:  Medications  apixaban (ELIQUIS) tablet 10 mg (has no administration in time range)  iohexol (OMNIPAQUE) 350 MG/ML injection 75 mL (75 mLs Intravenous Contrast Given 09/18/18 2350)     ED Discharge Orders         Ordered    apixaban (ELIQUIS) 5 MG TABS tablet     09/19/18 0043           Note:  This document was prepared using Dragon voice recognition software and may include unintentional dictation errors.    Hinda Kehr, MD 09/19/18 [phone]

## 2018-09-19 DIAGNOSIS — I2699 Other pulmonary embolism without acute cor pulmonale: Secondary | ICD-10-CM | POA: Diagnosis not present

## 2018-09-19 LAB — APTT: aPTT: 24 seconds (ref 24–36)

## 2018-09-19 LAB — BRAIN NATRIURETIC PEPTIDE: B Natriuretic Peptide: 49 pg/mL (ref 0.0–100.0)

## 2018-09-19 LAB — PROTIME-INR
INR: 1.07
Prothrombin Time: 13.8 seconds (ref 11.4–15.2)

## 2018-09-19 MED ORDER — APIXABAN 5 MG PO TABS
10.0000 mg | ORAL_TABLET | ORAL | Status: AC
  Administered 2018-09-19: 10 mg via ORAL
  Filled 2018-09-19: qty 2

## 2018-09-19 MED ORDER — APIXABAN 5 MG PO TABS: ORAL_TABLET | ORAL | 2 refills | Status: DC

## 2018-09-19 NOTE — Discharge Instructions (Signed)
As we discussed, your symptoms appear to have been caused by multiple small pulmonary emboli in your right lung.  Fortunately they are affecting a relatively small area of your lung and there is no evidence of strain on your heart as a result of the blood clots.  You should improve once you have started on a blood thinner and we have given you a first dose of Eliquis tonight.  Please pick up your prescription from your pharmacy tomorrow and continue taking it as prescribed.  We recommend you follow-up with Dr. Grayland Ormond or one of his colleagues with hematology to determine if you would benefit from additional work-up and for him to monitor your anticoagulation and pulmonary embolism treatment.  You may also follow-up with your regular doctor.  Return to the emergency department if you develop new or worsening symptoms that concern you.

## 2018-09-24 DIAGNOSIS — Z86711 Personal history of pulmonary embolism: Secondary | ICD-10-CM | POA: Insufficient documentation

## 2018-09-24 DIAGNOSIS — I2699 Other pulmonary embolism without acute cor pulmonale: Secondary | ICD-10-CM | POA: Insufficient documentation

## 2018-09-24 NOTE — Progress Notes (Signed)
[place]  [phone]) [phone] [phone]) [phone]  ID: Hector [name] OB: 10/09/1960  MR#: [id number]  ZJI#:[id number]  Patient Care Team: Burnis Medin, MD as PCP - General Susa Day, MD (Orthopedic Surgery) Annia Belt, MD as Consulting Physician (Hematology and Oncology) Milagros Evener, NP as Nurse Practitioner Rana Snare, MD as Consulting Physician (Urology)  CHIEF COMPLAINT: Pulmonary embolism  INTERVAL HISTORY: Patient is an active 58 year old male who recently presented to the emergency room with increasing shortness of breath.  Subsequent CT scan revealed right-sided pulmonary embolism.  He returned to the emergency room 1 week later with continued shortness of breath and chest pain.  Pulmonary embolism was smaller, patient was noted to have T wave inversion.  Currently, he feels well and is asymptomatic.  He denies any further chest pain or shortness of breath.  He has no neurologic complaints.  He denies any recent fevers or illnesses.  He reports broken ribs in October 2019, but no other trauma.  He denies any nausea, vomiting, constipation, or diarrhea.  He has good appetite and denies weight loss.  He has no urinary complaints.  Patient offers no further specific complaints today.  REVIEW OF SYSTEMS:   Review of Systems  Constitutional: Negative.  Negative for fever, malaise/fatigue and weight loss.  Respiratory: Negative.  Negative for cough, hemoptysis and shortness of breath.   Cardiovascular: Negative.  Negative for chest pain and leg swelling.  Gastrointestinal: Negative.  Negative for abdominal pain.  Genitourinary: Negative.  Negative for hematuria.  Musculoskeletal: Negative.  Negative for back pain.  Skin: Negative.  Negative for rash.  Neurological: Negative.  Negative for focal weakness, weakness and headaches.  Psychiatric/Behavioral: Negative.  The patient is not nervous/anxious.     As per HPI. Otherwise, a complete  review of systems is negative.  PAST MEDICAL HISTORY: Past Medical History:  Diagnosis Date  . Abnormal LFTs    improved after gastic bypass  . ADJ DISORDER WITH MIXED ANXIETY & DEPRESSED MOOD 08/23/2009   Qualifier: Diagnosis of  By: [name] MD, Standley Brooking Now on lamictal and lexapro and elevil for sleep.   [name] Anxiety   . B12 nutritional deficiency   . Chest pain    hopsitalized 2 14 evaluation  . Closed head injury    laceration  9 12  no loc  vision change  . DDD (degenerative disc disease)   . Diabetes mellitus    resolved with gastric bypass  . GERD (gastroesophageal reflux disease)    no meds  . Gout   . Headache    once per week uses Indomethicin hx of cluster headaches   . History of bunionectomy of left great toe   . History of concussion   . History of renal stone    Seen in emergency room not urologist  . Hyperlipidemia   . Hypertension 02/27/2017  . OBESITY 12/10/2007   Qualifier: Diagnosis of  By: [name] MD, Standley Brooking Gastric bypass   . OSA (obstructive sleep apnea) 12/12/2013  . Pancreatitis 03/21/2016   went to [place]  . Perforated ulcer [facility])    Surgical repair9/24 2011    PAST SURGICAL HISTORY: Past Surgical History:  Procedure Laterality Date  . BACK SURGERY  2012   Dr. Dorina Hoyer disk  . CARPAL TUNNEL RELEASE  2008   left-ulnar nerve decomp  . carpel tunnel Left   . CHOLECYSTECTOMY N/A 03/21/2016   Procedure: LAPAROSCOPIC CHOLECYSTECTOMY WITH INTRAOPERATIVE CHOLANGIOGRAM;  Surgeon: Ralene Ok, MD;  Location: [facility]S;  Service: General;  Laterality: N/A;  . colonscopy     . EPIDURAL BLOCK INJECTION     by Dr. Nelva Bush  . FOOT SURGERY     bilat for removal of extra bone   . GASTRIC BYPASS  11/06/09   beginning weight 267  . HARDWARE REMOVAL N/A 05/02/2016   Procedure: Removal of hardware Lumbar five-Sacral one with Metrex;  Surgeon: Kristeen Miss, MD;  Location: [place] [facility]UR[facility] OR[facility];  Service: Neurosurgery;  Laterality: N/A;  Removal of hardware L5-S1 with  Metrex  . HERNIA REPAIR  2751   umbilical  . HIATAL HERNIA REPAIR N/A 01/20/2013   Procedure: LAPAROSCOPIC REPAIR OF INTERNAL HERNIA;  Surgeon: Shann Medal, MD;  Location: [facility];  Service: General;  Laterality: N/A;  . LUMBAR LAMINECTOMY/DECOMPRESSION MICRODISCECTOMY Left 06/07/2014   Procedure: MICRO LUMBAR DECOMPRESSION L5 - S1 ON THE LEFT/L5 FORAMINOTOMY/EXCISION SYNOVIAL CYST  1 LEVEL;  Surgeon: [name], MD;  Location: [facility];  Service: Orthopedics;  Laterality: Left;  [name] MASS EXCISION Right 04/18/2013   Procedure: RIGHT SMALL FINGER SKIN LESION EXCISION;  Surgeon: Jolyn Nap, MD;  Location: [place];  Service: Orthopedics;  Laterality: Right;  . NASAL SEPTOPLASTY W/ TURBINOPLASTY     x 2  . NEPHROLITHOTOMY Right 11/03/2014   Procedure: RIGHT PERCUTANEOUS NEPHROLITHOTOMY;  Surgeon: Claybon Jabs, MD;  Location: [facility];  Service: Urology;  Laterality: Right;  . SHOULDER ARTHROSCOPY  2014   rt  . surgerical repair of stomach ulcer  06/08/10   per  . TONSILLECTOMY    . UVULOPALATOPHARYNGOPLASTY  2007   with tonsils  . VASECTOMY      FAMILY HISTORY: Family History  Problem Relation Age of Onset  . Uterine cancer Mother   . Hypertension Mother   . Cervical cancer Mother   . Heart disease Father   . COPD Father   . Seizures Father   . Colon cancer Maternal Uncle   . Colon cancer Paternal Aunt     ADVANCED DIRECTIVES (Y/N):  N  HEALTH MAINTENANCE: Social History   Tobacco Use  . Smoking status: Never Smoker  . Smokeless tobacco: Never Used  Substance Use Topics  . Alcohol use: Yes    Comment: occas liquor   . Drug use: No     Colonoscopy:  PAP:  Bone density:  Lipid panel:  Allergies  Allergen Reactions  . Aspirin Other (See Comments)    perforated ulcer  . Cymbalta [Duloxetine Hcl] Other (See Comments)    Over-sedating; slept over 24 hours with one dose.  Possible serotonin syndrome.  . Tape Rash    Surgical tape  . Nsaids  Other (See Comments)    ulcer Other reaction(s): Other (See Comments) Perforated ulcer  . Hydrocodone Other (See Comments)    headache  . Vancomycin Rash    Red man syndrome.  Please premedicate.    Current Outpatient Medications  Medication Sig Dispense Refill  . albuterol (PROVENTIL HFA;VENTOLIN HFA) 108 (90 Base) MCG/ACT inhaler Inhale 2 puffs into the lungs every 6 (six) hours as needed for wheezing or shortness of breath. 1 Inhaler 0  . apixaban (ELIQUIS) 5 MG TABS tablet Take 2 tablets (10 mg) PO BID x 7 days, then reduce dose to 1 tablet (5 mg) PO BID. (Patient taking differently: Take 5 mg by mouth 2 (two) times daily. ) 60 tablet 2  . Cholecalciferol (VITAMIN D-1000 MAX ST) 1000 units tablet Take 1,000 Units by  mouth daily.    [name] escitalopram (LEXAPRO) 20 MG tablet Take 20 mg by mouth every morning.     . lamoTRIgine (LAMICTAL) 100 MG tablet Take 100 mg by mouth 2 (two) times daily.     [name] lisinopril (PRINIVIL,ZESTRIL) 10 MG tablet Take 1 tablet (10 mg total) by mouth daily. May increase to 20mg  if needed. Send BP readings in 1 month. 90 tablet 0  . Multiple Vitamin (MULTIVITAMIN) tablet Take 1 tablet by mouth daily.    [name] Spacer/Aero-Holding Chambers (E-Z SPACER) inhaler Use as instructed 1 each 2  . traZODone (DESYREL) 100 MG tablet Take 300 mg by mouth at bedtime.     . vitamin B-12 (CYANOCOBALAMIN) 1000 MCG tablet Take 1,000 mcg by mouth daily.    . nitroGLYCERIN (NITROSTAT) 0.3 MG SL tablet Place 1 tablet (0.3 mg total) under the tongue every 5 (five) minutes as needed for chest pain. (Patient not taking: Reported on 09/28/2018) 100 tablet 3  . tamsulosin (FLOMAX) 0.4 MG CAPS capsule Take 1 capsule (0.4 mg total) by mouth daily. (Patient not taking: Reported on 09/28/2018) 30 capsule 1   No current facility-administered medications for this visit.     OBJECTIVE: Vitals:   09/28/18 0932 09/28/18 0938  BP: 135/89   Pulse: 92   Temp: (!) 97.3 F (36.3 C)   SpO2:  96%      Body mass index is 27.97 kg/m.    ECOG FS:0 - Asymptomatic  General: Well-developed, well-nourished, no acute distress. Eyes: Pink conjunctiva, anicteric sclera. HEENT: Normocephalic, moist mucous membranes, clear oropharnyx. Lungs: Clear to auscultation bilaterally. Heart: Regular rate and rhythm. No rubs, murmurs, or gallops. Abdomen: Soft, nontender, nondistended. No organomegaly noted, normoactive bowel sounds. Musculoskeletal: No edema, cyanosis, or clubbing. Neuro: Alert, answering all questions appropriately. Cranial nerves grossly intact. Skin: No rashes or petechiae noted. Psych: Normal affect. Lymphatics: No cervical, calvicular, axillary or inguinal LAD.   LAB RESULTS:  Lab Results  Component Value Date   NA 142 09/27/2018   K 4.4 09/27/2018   CL 109 09/27/2018   CO2 28 09/27/2018   GLUCOSE 94 09/27/2018   BUN 11 09/27/2018   CREATININE 1.28 (H) 09/27/2018   CALCIUM 8.3 (L) 09/27/2018   PROT 6.6 07/10/2018   ALBUMIN 3.9 07/10/2018   AST 40 07/10/2018   ALT 22 07/10/2018   ALKPHOS 76 07/10/2018   BILITOT 0.6 07/10/2018   GFRNONAA >60 09/27/2018   GFRAA >60 09/27/2018    Lab Results  Component Value Date   WBC 3.7 (L) 09/27/2018   NEUTROABS 4.1 07/10/2018   HGB 12.1 (L) 09/27/2018   HCT 36.0 (L) 09/27/2018   MCV 107.5 (H) 09/27/2018   PLT 126 (L) 09/27/2018     STUDIES: Dg Chest 2 View  Result Date: 09/26/2018 CLINICAL DATA:  Shortness of breath today. Recently diagnosed with pulmonary embolism. History of hypertension and diabetes. EXAM: CHEST - 2 VIEW COMPARISON:  Radiographs 09/18/2018.  CT 09/18/2018. FINDINGS: The heart size and mediastinal contours are normal. The lungs are stable with minimal basilar scarring or atelectasis. There is no pleural effusion or pneumothorax. No acute osseous findings are identified. Telemetry leads overlie the chest. There are mild degenerative changes throughout the spine. IMPRESSION: Stable chest.  No acute  cardiopulmonary process. Electronically Signed   By: Richardean Sale M.D.   On: 09/26/2018 16:00   Dg Chest 2 View  Result Date: 09/18/2018 CLINICAL DATA:  Pneumonia EXAM: CHEST - 2 VIEW COMPARISON:  07/05/2018 FINDINGS: Cardiac and  mediastinal margins appear normal. Thoracic spondylosis. Left lower lateral rib deformities from prior fractures. Mild scarring or atelectasis in the left lower lobe and lingula. The lungs appear otherwise clear. IMPRESSION: 1. Minimal left lower lobe and lingular scarring or atelectasis. This is similar to prior exams. 2. No new airspace opacity. 3. Thoracic spondylosis. Electronically Signed   By: Van Clines M.D.   On: 09/18/2018 19:47   Ct Angio Chest Pe W And/or Wo Contrast  Result Date: 09/26/2018 CLINICAL DATA:  Shortness of breath.  Diagnosed with PE last week. EXAM: CT ANGIOGRAPHY CHEST WITH CONTRAST TECHNIQUE: Multidetector CT imaging of the chest was performed using the standard protocol during bolus administration of intravenous contrast. Multiplanar CT image reconstructions and MIPs were obtained to evaluate the vascular anatomy. CONTRAST:  42mL OMNIPAQUE IOHEXOL 350 MG/ML SOLN COMPARISON:  09/18/2018 FINDINGS: Cardiovascular: The heart size is normal. No substantial pericardial effusion. Atherosclerotic calcification is noted in the wall of the thoracic aorta. There is persistent but decreased small volume clot burden in pulmonary arteries to the right upper and lower lobes, as before. No new additional on today's exam. Pulmonary embolus evident Mediastinum/Nodes: No mediastinal lymphadenopathy. Cluster of nodular soft tissue attenuation in the anterior mediastinum is stable since recent comparison and comparing back to 05/28/2017. There is no hilar lymphadenopathy. The esophagus has normal imaging features. There is no axillary lymphadenopathy. Lungs/Pleura: Compressive atelectasis noted in the lower lungs bilaterally. No pleural effusion. Upper Abdomen: The  liver shows diffusely decreased attenuation suggesting steatosis. Status post gastric bypass. Musculoskeletal: No worrisome lytic or sclerotic osseous abnormality. Nonacute left rib fractures again noted. Review of the MIP images confirms the above findings. IMPRESSION: 1. Previously identified small volume pulmonary embolus in the right lung has decreased slightly in the interval. No new pulmonary embolic disease on today's exam. 2. Compressive atelectasis in the dependent lungs without pleural effusion. 3. Hepatic steatosis 4.  Aortic Atherosclerois (ICD10-170.0) Electronically Signed   By: Misty Stanley M.D.   On: 09/26/2018 17:31   Ct Angio Chest Pe W/cm &/or Wo Cm  Result Date: 09/19/2018 CLINICAL DATA:  Initial evaluation for acute dyspnea. Shortness of breath. EXAM: CT ANGIOGRAPHY CHEST WITH CONTRAST TECHNIQUE: Multidetector CT imaging of the chest was performed using the standard protocol during bolus administration of intravenous contrast. Multiplanar CT image reconstructions and MIPs were obtained to evaluate the vascular anatomy. CONTRAST:  39mL OMNIPAQUE IOHEXOL 350 MG/ML SOLN COMPARISON:  Prior radiograph from earlier the same day. FINDINGS: Cardiovascular: Intrathoracic aorta of normal caliber without aneurysm or other acute finding. Mild atherosclerosis within the aortic arch. Visualized great vessels within normal limits. Heart size normal. No pericardial effusion. Pulmonary arterial tree adequately opacified for evaluation. Main pulmonary artery measures within normal limits for caliber. Focal filling defect within right lower lobe segmental arterial branch, consistent with acute pulmonary embolism (series 7, image 161). Few additional tiny filling defects noted distally within right lower lobe subsegmental branches. Probable minimal thrombus within anterior right upper lobe segmental branch (series 7, image 101). No other significant filling defects identified. RV to LV ratio within normal  limits without evidence for right heart strain. Mediastinum/Nodes: Visualized thyroid within normal limits. No pathologically enlarged mediastinal, hilar, or axillary lymph nodes identified. Small amount of layering fluid noted within the upper esophageal lumen. Small hiatal hernia noted. Lungs/Pleura: Tracheobronchial tree intact and patent. Lungs well inflated bilaterally. Mild linear atelectasis and/or scarring noted within the lingula and left lower lobe. No consolidative opacity to suggest pneumonia. No pulmonary edema  or pleural effusion. No pneumothorax. No worrisome pulmonary nodule or mass. Upper Abdomen: Calcified granuloma noted within the posterior right hepatic lobe. Hepatic steatosis noted. 17 mm hypodensity within the central aspect of the liver suggestive of a small cyst. Liver otherwise unremarkable. Sequelae of prior gastric bypass noted. Small hiatal hernia. Remainder the visualized upper abdomen otherwise unremarkable. Musculoskeletal: No acute osseous abnormality. No lytic or blastic osseous lesions. Subacute healing fractures of the left lateral seventh, eighth, and ninth ribs noted. External soft tissues unremarkable. Review of the MIP images confirms the above findings. IMPRESSION: 1. Acute small volume pulmonary embolism involving primarily right lower lobe segmental arterial branch. No evidence for right heart strain. 2. No other acute cardiopulmonary abnormality identified. 3. Subacute healing fractures of the left lateral seventh, eighth, and ninth ribs. 4. Hepatic steatosis. 5. Small hiatal hernia. Critical Value/emergent results were called by telephone at the time of interpretation on 09/19/2018 at 12:22 am to Dr. Hinda Kehr , who verbally acknowledged these results. Electronically Signed   By: Jeannine Boga M.D.   On: 09/19/2018 00:26   Nm Myocar Multi W/spect W/wall Motion / Ef  Result Date: 09/27/2018  There was no ST segment deviation noted during stress.  No T wave  inversion was noted during stress.  The study is normal.  This is a low risk study.  The left ventricular ejection fraction is normal (55-65%).    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 EXAM: MRI ABDOMEN WITHOUT AND WITH CONTRAST (INCLUDING MRCP) TECHNIQUE: Multiplanar multisequence MR imaging of the abdomen was performed both before and after the administration of intravenous contrast. Heavily T2-weighted images of the biliary and pancreatic ducts were obtained, and three-dimensional MRCP images were rendered by post processing. CONTRAST:  54mL MULTIHANCE GADOBENATE DIMEGLUMINE 529 MG/ML IV SOLN COMPARISON:  Multiple prior CT scans dating back to 2012. FINDINGS: Lower chest: The lung bases are clear. No pulmonary nodules or pleural effusion. The heart is normal in size. No pericardial effusion. Hepatobiliary: Advanced fatty infiltration of the liver with significant loss of signal intensity on the out of phase images gradient echo T1 weighted images. No focal hepatic lesions or intrahepatic biliary dilatation. The portal and hepatic veins are normal. The gallbladder is surgically absent. No common bile duct dilatation. Normal caliber and course of the common bile duct. Pancreas: There is a 16.5 mm curvilinear cystic lesion in the uncinate process region of the pancreas. When looking back at multiple prior studies this has been present since 2012. No enhancement is identified. The main pancreatic duct appears normal. The remainder of the pancreas is normal. Spleen:  Normal size.  No focal lesions. Adrenals/Urinary Tract:  The adrenal glands and kidneys are normal. Stomach/Bowel: Stable surgical changes related to gastric bypass surgery. No complicating features. Visualized portions within the abdomen are unremarkable. Vascular/Lymphatic: No pathologically enlarged lymph nodes identified. No abdominal aortic aneurysm  demonstrated. Other:  No ascites or abdominal wall hernia. Musculoskeletal: No significant bony findings. IMPRESSION: 1. Stable cystic area in the uncinate process region the pancreas with somewhat of a curvilinear tail. 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 It has been present dating back to abdominal CT scans from 2012. This is certainly consistent with benign process and I do not think it needs any further imaging evaluation or follow-up. 2. Status post cholecystectomy. No intra or extrahepatic biliary dilatation. 3. Surgical changes  from gastric bypass surgery. No complicating features. 4. Diffuse fatty infiltration of the liver. Electronically Signed   By: Marijo Sanes M.D.   On: 09/06/2018 12:19    ASSESSMENT: Pulmonary embolism  PLAN:    1. Pulmonary embolism: CT scan results from September 19, 2017 reviewed independently and reported as above with improvement of right lower lobe segmental pulmonary embolism after 1 week of treatment.  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  He has no obvious transient risk factors.  He broke 3 left sided ribs in October 2019, that may have contributed.  He has no personal or family history of DVT or blood clot.  Full hypercoagulable work-up was initiated today.  Patient will require a minimum of 3 months of Eliquis.  Return to clinic in 3 months to discuss his laboratory work and whether or not to continue Eliquis.  I spent a total of 45 minutes face-to-face with the patient of which greater than 50% of the visit was spent in counseling and coordination of care as detailed above.   Patient expressed understanding and was in agreement with this plan. He also understands that He can call clinic at any time with any questions, concerns, or complaints.    Lloyd Huger, MD   09/28/2018 10:21 AM

## 2018-09-26 ENCOUNTER — Emergency Department: Payer: Federal, State, Local not specified - PPO

## 2018-09-26 ENCOUNTER — Observation Stay
Admission: EM | Admit: 2018-09-26 | Discharge: 2018-09-27 | Disposition: A | Discharge status: Home / Self Care | Payer: Federal, State, Local not specified - PPO | Attending: Internal Medicine | Admitting: Internal Medicine

## 2018-09-26 ENCOUNTER — Other Ambulatory Visit: Payer: Self-pay

## 2018-09-26 DIAGNOSIS — R079 Chest pain, unspecified: Secondary | ICD-10-CM | POA: Diagnosis not present

## 2018-09-26 DIAGNOSIS — F329 Major depressive disorder, single episode, unspecified: Secondary | ICD-10-CM | POA: Insufficient documentation

## 2018-09-26 DIAGNOSIS — Z888 Allergy status to other drugs, medicaments and biological substances status: Secondary | ICD-10-CM | POA: Insufficient documentation

## 2018-09-26 DIAGNOSIS — E119 Type 2 diabetes mellitus without complications: Secondary | ICD-10-CM | POA: Diagnosis not present

## 2018-09-26 DIAGNOSIS — I2699 Other pulmonary embolism without acute cor pulmonale: Secondary | ICD-10-CM | POA: Diagnosis not present

## 2018-09-26 DIAGNOSIS — R9431 Abnormal electrocardiogram [ECG] [EKG]: Secondary | ICD-10-CM

## 2018-09-26 DIAGNOSIS — Z9884 Bariatric surgery status: Secondary | ICD-10-CM | POA: Diagnosis not present

## 2018-09-26 DIAGNOSIS — G4733 Obstructive sleep apnea (adult) (pediatric): Secondary | ICD-10-CM | POA: Insufficient documentation

## 2018-09-26 DIAGNOSIS — K219 Gastro-esophageal reflux disease without esophagitis: Secondary | ICD-10-CM | POA: Insufficient documentation

## 2018-09-26 DIAGNOSIS — M109 Gout, unspecified: Secondary | ICD-10-CM | POA: Insufficient documentation

## 2018-09-26 DIAGNOSIS — Z86711 Personal history of pulmonary embolism: Secondary | ICD-10-CM | POA: Insufficient documentation

## 2018-09-26 DIAGNOSIS — E785 Hyperlipidemia, unspecified: Secondary | ICD-10-CM | POA: Diagnosis not present

## 2018-09-26 DIAGNOSIS — Z79899 Other long term (current) drug therapy: Secondary | ICD-10-CM | POA: Insufficient documentation

## 2018-09-26 DIAGNOSIS — I712 Thoracic aortic aneurysm, without rupture: Secondary | ICD-10-CM | POA: Insufficient documentation

## 2018-09-26 DIAGNOSIS — R0602 Shortness of breath: Secondary | ICD-10-CM | POA: Diagnosis not present

## 2018-09-26 DIAGNOSIS — I10 Essential (primary) hypertension: Secondary | ICD-10-CM | POA: Diagnosis not present

## 2018-09-26 LAB — CBC
HCT: 39 % (ref 39.0–52.0)
Hemoglobin: 13.4 g/dL (ref 13.0–17.0)
MCH: 35.4 pg — AB (ref 26.0–34.0)
MCHC: 34.4 g/dL (ref 30.0–36.0)
MCV: 103.2 fL — ABNORMAL HIGH (ref 80.0–100.0)
Platelets: 147 10*3/uL — ABNORMAL LOW (ref 150–400)
RBC: 3.78 MIL/uL — ABNORMAL LOW (ref 4.22–5.81)
RDW: 13.5 % (ref 11.5–15.5)
WBC: 5.2 10*3/uL (ref 4.0–10.5)
nRBC: 0 % (ref 0.0–0.2)

## 2018-09-26 LAB — TROPONIN I
Troponin I: <0.03 ng/mL (ref <0.03)
Troponin I: <0.03 ng/mL (ref <0.03)

## 2018-09-26 LAB — BASIC METABOLIC PANEL
Anion gap: 11 (ref 5–15)
BUN: 9 mg/dL (ref 6–20)
CO2: 22 mmol/L (ref 22–32)
CREATININE: 1.23 mg/dL (ref 0.61–1.24)
Calcium: 8.8 mg/dL — ABNORMAL LOW (ref 8.9–10.3)
Chloride: 109 mmol/L (ref 98–111)
GFR calc Af Amer: >60 mL/min (ref >60)
GFR calc non Af Amer: >60 mL/min (ref >60)
Glucose, Bld: 88 mg/dL (ref 70–99)
Potassium: 3.4 mmol/L — ABNORMAL LOW (ref 3.5–5.1)
Sodium: 142 mmol/L (ref 135–145)

## 2018-09-26 MED ORDER — MORPHINE SULFATE (PF) 2 MG/ML IV SOLN
2.0000 mg | Freq: Once | INTRAVENOUS | Status: AC
  Administered 2018-09-26: 2 mg via INTRAVENOUS
  Filled 2018-09-26: qty 1

## 2018-09-26 MED ORDER — ESCITALOPRAM OXALATE 10 MG PO TABS
20.0000 mg | ORAL_TABLET | Freq: Every morning | ORAL | Status: DC
  Administered 2018-09-27: 20 mg via ORAL
  Filled 2018-09-26: qty 2

## 2018-09-26 MED ORDER — APIXABAN 5 MG PO TABS
5.0000 mg | ORAL_TABLET | Freq: Two times a day (BID) | ORAL | Status: DC
  Administered 2018-09-26 – 2018-09-27 (×2): 5 mg via ORAL
  Filled 2018-09-26 (×2): qty 1

## 2018-09-26 MED ORDER — LAMOTRIGINE 100 MG PO TABS
100.0000 mg | ORAL_TABLET | Freq: Two times a day (BID) | ORAL | Status: DC
  Administered 2018-09-26 – 2018-09-27 (×2): 100 mg via ORAL
  Filled 2018-09-26 (×2): qty 1

## 2018-09-26 MED ORDER — ASPIRIN EC 81 MG PO TBEC
81.0000 mg | DELAYED_RELEASE_TABLET | Freq: Every day | ORAL | Status: DC
  Administered 2018-09-27: 81 mg via ORAL
  Filled 2018-09-26: qty 1

## 2018-09-26 MED ORDER — ACETAMINOPHEN 650 MG RE SUPP: 650.0000 mg | Freq: Four times a day (QID) | RECTAL | Status: DC | PRN

## 2018-09-26 MED ORDER — ENOXAPARIN SODIUM 40 MG/0.4ML ~~LOC~~ SOLN: 40.0000 mg | SUBCUTANEOUS | Status: DC

## 2018-09-26 MED ORDER — SODIUM CHLORIDE 0.9 % IV SOLN: 250.0000 mL | INTRAVENOUS | Status: DC | PRN

## 2018-09-26 MED ORDER — ACETAMINOPHEN 325 MG PO TABS: 650.0000 mg | ORAL_TABLET | Freq: Four times a day (QID) | ORAL | Status: DC | PRN

## 2018-09-26 MED ORDER — ALBUTEROL SULFATE (2.5 MG/3ML) 0.083% IN NEBU: 2.5000 mg | INHALATION_SOLUTION | Freq: Four times a day (QID) | RESPIRATORY_TRACT | Status: DC | PRN

## 2018-09-26 MED ORDER — SODIUM CHLORIDE 0.9% FLUSH: 3.0000 mL | INTRAVENOUS | Status: DC | PRN

## 2018-09-26 MED ORDER — HYDROCODONE-ACETAMINOPHEN 5-325 MG PO TABS: 1.0000 | ORAL_TABLET | ORAL | Status: DC | PRN

## 2018-09-26 MED ORDER — ONDANSETRON HCL 4 MG/2ML IJ SOLN
4.0000 mg | Freq: Once | INTRAMUSCULAR | Status: AC
  Administered 2018-09-26: 4 mg via INTRAVENOUS
  Filled 2018-09-26: qty 2

## 2018-09-26 MED ORDER — ADULT MULTIVITAMIN W/MINERALS CH
1.0000 | ORAL_TABLET | Freq: Every day | ORAL | Status: DC
  Administered 2018-09-27: 1 via ORAL
  Filled 2018-09-26: qty 1

## 2018-09-26 MED ORDER — SODIUM CHLORIDE 0.9% FLUSH
3.0000 mL | Freq: Two times a day (BID) | INTRAVENOUS | Status: DC
  Administered 2018-09-26 – 2018-09-27 (×2): 3 mL via INTRAVENOUS

## 2018-09-26 MED ORDER — LISINOPRIL 10 MG PO TABS
10.0000 mg | ORAL_TABLET | Freq: Every day | ORAL | Status: DC
  Administered 2018-09-27: 10 mg via ORAL
  Filled 2018-09-26: qty 1

## 2018-09-26 MED ORDER — ONDANSETRON HCL 4 MG PO TABS: 4.0000 mg | ORAL_TABLET | Freq: Four times a day (QID) | ORAL | Status: DC | PRN

## 2018-09-26 MED ORDER — VITAMIN B-12 1000 MCG PO TABS
1000.0000 ug | ORAL_TABLET | Freq: Every day | ORAL | Status: DC
  Administered 2018-09-27: 1000 ug via ORAL
  Filled 2018-09-26: qty 1

## 2018-09-26 MED ORDER — TRAMADOL HCL 50 MG PO TABS
50.0000 mg | ORAL_TABLET | Freq: Four times a day (QID) | ORAL | Status: DC | PRN
  Administered 2018-09-26 – 2018-09-27 (×3): 50 mg via ORAL
  Filled 2018-09-26 (×3): qty 1

## 2018-09-26 MED ORDER — IOHEXOL 350 MG/ML SOLN
75.0000 mL | Freq: Once | INTRAVENOUS | Status: AC | PRN
  Administered 2018-09-26: 75 mL via INTRAVENOUS

## 2018-09-26 MED ORDER — MORPHINE SULFATE (PF) 4 MG/ML IV SOLN
4.0000 mg | Freq: Once | INTRAVENOUS | Status: AC
  Administered 2018-09-26: 4 mg via INTRAVENOUS
  Filled 2018-09-26: qty 1

## 2018-09-26 MED ORDER — ONDANSETRON HCL 4 MG/2ML IJ SOLN: 4.0000 mg | Freq: Four times a day (QID) | INTRAMUSCULAR | Status: DC | PRN

## 2018-09-26 MED ORDER — VITAMIN D3 25 MCG (1000 UNIT) PO TABS
1000.0000 [IU] | ORAL_TABLET | Freq: Every day | ORAL | Status: DC
  Administered 2018-09-27: 1000 [IU] via ORAL
  Filled 2018-09-26: qty 1

## 2018-09-26 MED ORDER — TRAZODONE HCL 100 MG PO TABS
300.0000 mg | ORAL_TABLET | Freq: Every day | ORAL | Status: DC
  Administered 2018-09-26: 300 mg via ORAL
  Filled 2018-09-26: qty 3

## 2018-09-26 NOTE — H&P (Signed)
[facility] NAME: Hector [name]    MR#:  [id number]  DATE OF BIRTH:  [birth year]  DATE OF ADMISSION:  09/26/2018  PRIMARY CARE PHYSICIAN: Burnis Medin, MD   REQUESTING/REFERRING PHYSICIAN:   CHIEF COMPLAINT:   Chief Complaint  Patient presents with  . Shortness of Breath  . Chest Pain    HISTORY OF PRESENT ILLNESS: Hector [name]  is a 58 y.o. male with a known history of diabetes type 2, GERD, history of gout,, hyperlipidemia, hypertension who was seen in the emergency room about a week ago with chest pressure and shortness of breath and was diagnosed with pulmonary embolism noted in his right lung.  Patient was discharged home on Eliquis.  He states that he has been having shortness of breath and chest pain despite being treated.  He comes to the emergency room with similar symptoms.  In the ER he had a repeat CT which shows decreasing clot burden.  Patient's cardiac enzymes were negative.  His EKG did show some T wave inversions in the inferior lead the ER physician spoke to Dr. Lenon Curt of cardiology who recommended patient be admitted for observation for further evaluation.   PAST MEDICAL HISTORY:   Past Medical History:  Diagnosis Date  . Abnormal LFTs    improved after gastic bypass  . ADJ DISORDER WITH MIXED ANXIETY & DEPRESSED MOOD 08/23/2009   Qualifier: Diagnosis of  By: [name] MD, Standley Brooking Now on lamictal and lexapro and elevil for sleep.   . Anginal pain ([place])   . Anxiety   . B12 nutritional deficiency   . Chest pain    hopsitalized 2 14 evaluation  . Closed head injury    laceration  9 12  no loc  vision change  . DDD (degenerative disc disease)   . Diabetes mellitus    resolved with gastric bypass  . DIABETES MELLITUS, TYPE II 04/06/2007   Qualifier: Diagnosis of  By: [name] MD, Standley Brooking Improved and off meds since gastric bypass   . GERD (gastroesophageal reflux disease)    no meds  . Gout   . Headache    once per week  uses Indomethicin hx of cluster headaches   . History of bunionectomy of left great toe   . History of concussion   . History of renal stone    Seen in emergency room not urologist  . Hyperlipidemia   . Hypertension 02/27/2017  . OBESITY 12/10/2007   Qualifier: Diagnosis of  By: [name] MD, Standley Brooking Gastric bypass   . OBSTRUCTIVE SLEEP APNEA 07/22/2007   Qualifier: Diagnosis of  By: [name] RN, Doroteo Bradford    . OSA (obstructive sleep apnea) 12/12/2013  . Pancreatitis 03/21/2016   went to [place]  . Perforated ulcer ([place])    Surgical repair9/24 2011  . Sleep apnea    had gastric bypass/ sa-gone  . 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 BACK SURGERY  2012   Dr. Dorina Hoyer disk  . CARPAL TUNNEL RELEASE  2008   left-ulnar nerve decomp  . carpel tunnel Left   . CHOLECYSTECTOMY N/A 03/21/2016   Procedure: LAPAROSCOPIC CHOLECYSTECTOMY WITH INTRAOPERATIVE CHOLANGIOGRAM;  Surgeon: Ralene Ok, MD;  Location: [facility];  Service: General;  Laterality: N/A;  . colonscopy     . EPIDURAL BLOCK INJECTION     by Dr. Nelva Bush  . FOOT SURGERY  bilat for removal of extra bone   . GASTRIC BYPASS  11/06/09   beginning weight 267  . HARDWARE REMOVAL N/A 05/02/2016   Procedure: Removal of hardware Lumbar five-Sacral one with Metrex;  Surgeon: Kristeen Miss, MD;  Location: [facility];  Service: Neurosurgery;  Laterality: N/A;  Removal of hardware L5-S1 with Metrex  . HERNIA REPAIR  3299   umbilical  . HIATAL HERNIA REPAIR N/A 01/20/2013   Procedure: LAPAROSCOPIC REPAIR OF INTERNAL HERNIA;  Surgeon: Shann Medal, MD;  Location: [facility];  Service: General;  Laterality: N/A;  . LUMBAR LAMINECTOMY/DECOMPRESSION MICRODISCECTOMY Left 06/07/2014   Procedure: MICRO LUMBAR DECOMPRESSION L5 - S1 ON THE LEFT/L5 FORAMINOTOMY/EXCISION SYNOVIAL CYST  1 LEVEL;  Surgeon: [name], MD;  Location: [facility];  Service: Orthopedics;   Laterality: Left;  [name] MASS EXCISION Right 04/18/2013   Procedure: RIGHT SMALL FINGER SKIN LESION EXCISION;  Surgeon: Jolyn Nap, MD;  Location: [facility];  Service: Orthopedics;  Laterality: Right;  . NASAL SEPTOPLASTY W/ TURBINOPLASTY     x 2  . NEPHROLITHOTOMY Right 11/03/2014   Procedure: RIGHT PERCUTANEOUS NEPHROLITHOTOMY;  Surgeon: Claybon Jabs, MD;  Location: [facility];  Service: Urology;  Laterality: Right;  . SHOULDER ARTHROSCOPY  2014   rt  . surgerical repair of stomach ulcer  06/08/10   per  . TONSILLECTOMY    . UVULOPALATOPHARYNGOPLASTY  2007   with tonsils  . VASECTOMY      SOCIAL HISTORY:  Social History   Tobacco Use  . Smoking status: Never Smoker  . Smokeless tobacco: Never Used  Substance Use Topics  . Alcohol use: Yes    Comment: occas liquor     FAMILY HISTORY:  Family History  Problem Relation Age of Onset  . Uterine cancer Mother   . Hypertension Mother   . Cervical cancer Mother   . Heart disease Father   . COPD Father   . Colon cancer Maternal Uncle   . Colon cancer Paternal Aunt     DRUG ALLERGIES:  Allergies  Allergen Reactions  . Aspirin Other (See Comments)    perforated ulcer  . Cymbalta [Duloxetine Hcl] Other (See Comments)    Over-sedating; slept over 24 hours with one dose.  Possible serotonin syndrome.  . Tape Rash    Surgical tape  . Nsaids Other (See Comments)    ulcer Other reaction(s): Other (See Comments) Perforated ulcer  . Hydrocodone Other (See Comments)    headache  . Vancomycin Rash    Red man syndrome.  Please premedicate.    REVIEW OF SYSTEMS:   CONSTITUTIONAL: No fever, fatigue or weakness.  EYES: No blurred or double vision.  EARS, NOSE, AND THROAT: No tinnitus or ear pain.  RESPIRATORY: + cough, positive shortness of breath, no wheezing or hemoptysis.  CARDIOVASCULAR: No chest pain, orthopnea, edema.  GASTROINTESTINAL: No nausea, vomiting, diarrhea or abdominal pain.  GENITOURINARY: No  dysuria, hematuria.  ENDOCRINE: No polyuria, nocturia,  HEMATOLOGY: No anemia, easy bruising or bleeding SKIN: No rash or lesion. MUSCULOSKELETAL: No joint pain or arthritis.   NEUROLOGIC: No tingling, numbness, weakness.  PSYCHIATRY: No anxiety or depression.   MEDICATIONS AT HOME:  Prior to Admission medications   Medication Sig Start Date End Date Taking? Authorizing Provider  acetaminophen (TYLENOL ARTHRITIS PAIN) 650 MG CR tablet Take 650 mg by mouth every 8 (eight) hours as needed for pain.    [provider]  albuterol (PROVENTIL HFA;VENTOLIN HFA) 108 (90 Base)  MCG/ACT inhaler Inhale 2 puffs into the lungs every 6 (six) hours as needed for wheezing or shortness of breath. 09/03/18   Caren Macadam, MD  apixaban (ELIQUIS) 5 MG TABS tablet Take 2 tablets (10 mg) PO BID x 7 days, then reduce dose to 1 tablet (5 mg) PO BID. 09/19/18   Hinda Kehr, MD  Cholecalciferol (VITAMIN D-1000 MAX ST) 1000 units tablet Take 1,000 Units by mouth daily.    [provider]  escitalopram (LEXAPRO) 20 MG tablet Take 20 mg by mouth every morning.     [provider]  lamoTRIgine (LAMICTAL) 100 MG tablet Take 100-200 mg by mouth 2 (two) times daily. Take 200 mg in the am and 100 mg qhs.    [provider]  lisinopril (PRINIVIL,ZESTRIL) 10 MG tablet Take 1 tablet (10 mg total) by mouth daily. May increase to 20mg  if needed. Send BP readings in 1 month. 12/29/17   Panosh, Standley Brooking, MD  Multiple Vitamin (MULTIVITAMIN) tablet Take 1 tablet by mouth daily.    [provider]  oxyCODONE-acetaminophen (PERCOCET) 10-325 MG tablet Take 1 tablet by mouth every 6 (six) hours as needed for pain. 07/10/18 07/10/19  [name], PA-C  predniSONE (DELTASONE) 10 MG tablet Take 3 tablets once a day for 5 days 07/10/18   [name], PA-C  Spacer/Aero-Holding Chambers (E-Z SPACER) inhaler Use as instructed 09/03/18   Caren Macadam, MD  tamsulosin (FLOMAX) 0.4 MG  CAPS capsule Take 1 capsule (0.4 mg total) by mouth daily. 10/29/17   Stoioff, Ronda Fairly, MD  traZODone (DESYREL) 100 MG tablet Take 300 mg by mouth at bedtime.     [provider]  vitamin B-12 (CYANOCOBALAMIN) 1000 MCG tablet Take 1,000 mcg by mouth daily.    [provider]      PHYSICAL EXAMINATION:   VITAL SIGNS: Blood pressure 112/78, pulse 70, temperature 98.4 F (36.9 C), temperature source Oral, resp. rate 11, height 5\' 10"  (1.778 m), weight 82.1 kg, SpO2 92 %.  GENERAL:  58 y.o.-year-old patient lying in the bed with no acute distress.  EYES: Pupils equal, round, reactive to light and accommodation. No scleral icterus. Extraocular muscles intact.  HEENT: Head atraumatic, normocephalic. Oropharynx and nasopharynx clear.  NECK:  Supple, no jugular venous distention. No thyroid enlargement, no tenderness.  LUNGS: Normal breath sounds bilaterally, no wheezing, rales,rhonchi or crepitation. No use of accessory muscles of respiration.  CARDIOVASCULAR: S1, S2 normal. No murmurs, rubs, or gallops.  ABDOMEN: Soft, nontender, nondistended. Bowel sounds present. No organomegaly or mass.  EXTREMITIES: No pedal edema, cyanosis, or clubbing.  NEUROLOGIC: Cranial nerves II through XII are intact. Muscle strength 5/5 in all extremities. Sensation intact. Gait not checked.  PSYCHIATRIC: The patient is alert and oriented x 3.  SKIN: No obvious rash, lesion, or ulcer.   LABORATORY PANEL:   CBC Recent Labs  Lab 09/26/18 1548  WBC 5.2  HGB 13.4  HCT 39.0  PLT 147*  MCV 103.2*  MCH 35.4*  MCHC 34.4  RDW 13.5   ------------------------------------------------------------------------------------------------------------------  Chemistries  Recent Labs  Lab 09/26/18 1548  NA 142  K 3.4*  CL 109  CO2 22  GLUCOSE 88  BUN 9  CREATININE 1.23  CALCIUM 8.8*    ------------------------------------------------------------------------------------------------------------------ estimated creatinine clearance is 68.4 mL/min (by C-G formula based on SCr of 1.23 mg/dL). ------------------------------------------------------------------------------------------------------------------ No results for input(s): TSH, T4TOTAL, T3FREE, THYROIDAB in the last 72 hours.  Invalid input(s): FREET3   Coagulation profile  No results for input(s): INR, PROTIME in the last 168 hours. ------------------------------------------------------------------------------------------------------------------- No results for input(s): DDIMER in the last 72 hours. -------------------------------------------------------------------------------------------------------------------  Cardiac Enzymes Recent Labs  Lab 09/26/18 1548  TROPONINI <0.03   ------------------------------------------------------------------------------------------------------------------ Invalid input(s): POCBNP  ---------------------------------------------------------------------------------------------------------------  Urinalysis    Component Value Date/Time   COLORURINE STRAW (A) 07/10/2018 1308   APPEARANCEUR CLEAR (A) 07/10/2018 1308   APPEARANCEUR Clear 10/29/2017 0837   LABSPEC 1.005 07/10/2018 1308   PHURINE 5.0 07/10/2018 1308   GLUCOSEU 50 (A) 07/10/2018 1308   HGBUR NEGATIVE 07/10/2018 1308   BILIRUBINUR NEGATIVE 07/10/2018 1308   BILIRUBINUR Negative 10/29/2017 [place] 07/10/2018 1308   PROTEINUR NEGATIVE 07/10/2018 1308   UROBILINOGEN 1.0 05/09/2015 1844   NITRITE NEGATIVE 07/10/2018 1308   LEUKOCYTESUR NEGATIVE 07/10/2018 1308   LEUKOCYTESUR Negative 10/29/2017 0837     RADIOLOGY: Dg Chest 2 View  Result Date: 09/26/2018 CLINICAL DATA:  Shortness of breath today. Recently diagnosed with pulmonary embolism. History of hypertension and diabetes. EXAM: CHEST  - 2 VIEW COMPARISON:  Radiographs 09/18/2018.  CT 09/18/2018. FINDINGS: The heart size and mediastinal contours are normal. The lungs are stable with minimal basilar scarring or atelectasis. There is no pleural effusion or pneumothorax. No acute osseous findings are identified. Telemetry leads overlie the chest. There are mild degenerative changes throughout the spine. IMPRESSION: Stable chest.  No acute cardiopulmonary process. Electronically Signed   By: Richardean Sale M.D.   On: 09/26/2018 16:00   Ct Angio Chest Pe W And/or Wo Contrast  Result Date: 09/26/2018 CLINICAL DATA:  Shortness of breath.  Diagnosed with PE last week. EXAM: CT ANGIOGRAPHY CHEST WITH CONTRAST TECHNIQUE: Multidetector CT imaging of the chest was performed using the standard protocol during bolus administration of intravenous contrast. Multiplanar CT image reconstructions and MIPs were obtained to evaluate the vascular anatomy. CONTRAST:  17mL OMNIPAQUE IOHEXOL 350 MG/ML SOLN COMPARISON:  09/18/2018 FINDINGS: Cardiovascular: The heart size is normal. No substantial pericardial effusion. Atherosclerotic calcification is noted in the wall of the thoracic aorta. There is persistent but decreased small volume clot burden in pulmonary arteries to the right upper and lower lobes, as before. No new additional on today's exam. Pulmonary embolus evident Mediastinum/Nodes: No mediastinal lymphadenopathy. Cluster of nodular soft tissue attenuation in the anterior mediastinum is stable since recent comparison and comparing back to 05/28/2017. There is no hilar lymphadenopathy. The esophagus has normal imaging features. There is no axillary lymphadenopathy. Lungs/Pleura: Compressive atelectasis noted in the lower lungs bilaterally. No pleural effusion. Upper Abdomen: The liver shows diffusely decreased attenuation suggesting steatosis. Status post gastric bypass. Musculoskeletal: No worrisome lytic or sclerotic osseous abnormality. Nonacute left rib  fractures again noted. Review of the MIP images confirms the above findings. IMPRESSION: 1. Previously identified small volume pulmonary embolus in the right lung has decreased slightly in the interval. No new pulmonary embolic disease on today's exam. 2. Compressive atelectasis in the dependent lungs without pleural effusion. 3. Hepatic steatosis 4.  Aortic Atherosclerois (ICD10-170.0) Electronically Signed   By: Misty Stanley M.D.   On: 09/26/2018 17:31    EKG: Orders placed or performed during the hospital encounter of 09/26/18  . ED EKG within 10 minutes  . ED EKG within 10 minutes    IMPRESSION AND PLAN: Patient is 58 year old with recent diagnosis of pulmonary embolism  1.  Chest pain likely related to pulmonary embolism however cardiologist on call recommended admission for observation We will obtain a stress test in the morning Cardiology  consult in the morning Trend cardiac enzyme Echo of the heart  2.  Recent pulmonary embolism treat with Eliquis, patient requested hematology consult during hospitalization I stated that he can follow-up outpatient there does not seem to be any need for hematology to see him especially in light of the clot decreasing in size.  3.  Depression anxiety continue Lexapro  4.  Hypertension continue lisinopril  5.  BPH continue Flomax   All the records are reviewed and case discussed with ED provider. Management plans discussed with the patient, family and they are in agreement.  CODE STATUS: Code Status History    Date Active Date Inactive Code Status Order ID Comments User Context   11/24/2016 1052 11/25/2016 1744 Full Code [id number]  Modena Jansky, MD Inpatient   03/19/2016 1052 03/22/2016 1446 Full Code [id number]  Elmarie Shiley, MD Inpatient   11/03/2014 1425 11/05/2014 1454 Full Code [id number]  Claybon Jabs, MD Inpatient   06/07/2014 1446 06/08/2014 1712 Full Code [id number]  [name], MD Inpatient       TOTAL TIME TAKING CARE  OF THIS PATIENT: 46minutes.    Dustin Flock M.D on 09/26/2018 at 6:02 PM  Between 7am to 6pm - Pager - [phone]  After 6pm go to [email or web address] - password [email or web address] [facility]  [phone]  CC: Primary care physician; Burnis Medin, MD

## 2018-09-26 NOTE — ED Triage Notes (Signed)
Pt's wife to desk, states initially came in for SOB, now c/o CP. Pt taken back for EKG.

## 2018-09-26 NOTE — ED Provider Notes (Addendum)
[facility] Emergency Department Provider Note   ____________________________________________   First MD Initiated Contact with Patient 09/26/18 1520     (approximate)  I have reviewed the triage vital signs and the nursing notes.   HISTORY  Chief Complaint Shortness of Breath and Chest Pain  HPI Hector [name] is a 58 y.o. male who is seen on the fourth of this month and diagnosed with PEs.  He was put on Eliquis and he is to follow-up with Dr. Grayland Ormond in the cancer center this week.  Unfortunately today he had several episodes of increasing shortness of breath before and during church and after church and came in here.  Each of these comes along with chest tightness.  Not really wheezing related to exertion.  They last sometime in prove slightly but not completely.   Past Medical History:  Diagnosis Date  . Abnormal LFTs    improved after gastic bypass  . ADJ DISORDER WITH MIXED ANXIETY & DEPRESSED MOOD 08/23/2009   Qualifier: Diagnosis of  By: [name] MD, Standley Brooking Now on lamictal and lexapro and elevil for sleep.   . Anginal pain ([place])   . Anxiety   . B12 nutritional deficiency   . Chest pain    hopsitalized 2 14 evaluation  . Closed head injury    laceration  9 12  no loc  vision change  . DDD (degenerative disc disease)   . Diabetes mellitus    resolved with gastric bypass  . DIABETES MELLITUS, TYPE II 04/06/2007   Qualifier: Diagnosis of  By: [name] MD, Standley Brooking Improved and off meds since gastric bypass   . GERD (gastroesophageal reflux disease)    no meds  . Gout   . Headache    once per week uses Indomethicin hx of cluster headaches   . History of bunionectomy of left great toe   . History of concussion   . History of renal stone    Seen in emergency room not urologist  . Hyperlipidemia   . Hypertension 02/27/2017  . OBESITY 12/10/2007   Qualifier: Diagnosis of  By: [name] MD, Standley Brooking Gastric bypass   . OBSTRUCTIVE SLEEP APNEA 07/22/2007   Qualifier: Diagnosis of  By: [name] RN, Doroteo Bradford    . OSA (obstructive sleep apnea) 12/12/2013  . Pancreatitis 03/21/2016   went to [place]  . Perforated ulcer ([place])    Surgical repair9/24 2011  . Sleep apnea    had gastric bypass/ sa-gone  . 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 Pulmonary embolism ([place]) 09/24/2018  . Hypertension 02/27/2017  . Acute gastroenteritis 11/24/2016  . Sepsis ([place]) 11/24/2016  . AKI (acute kidney injury) ([place]) 11/24/2016  . Hypokalemia 11/24/2016  . Chronic low back pain 11/24/2016  . Pancreatitis 03/19/2016  . Renal calculi   . 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 Type II or unspecified type diabetes mellitus without mention of complication, not stated as uncontrolled 05/15/2014  . Lumbar back pain with radiculopathy affecting left lower extremity 05/15/2014  . Medication management 05/15/2014  . Pre-op evaluation 05/15/2014  . Insufficient treatment with nasal CPAP 04/24/2014  . 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 Hypoxemia 04/24/2014  . OSA (obstructive sleep apnea) 12/12/2013  . 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 History of renal stone   . Colon cancer screening 12/28/2012  . GERD (gastroesophageal reflux disease) 12/28/2012  .  Visit for preventive health examination 09/06/2012  . History of gout 09/06/2012  . Easy bruising 03/24/2012  . Headaches due to old head injury 03/24/2012  . Insomnia 03/24/2012  . BACK PAIN WITH RADICULOPATHY 10/07/2010  . Thrombocytopenia ([place]) 05/24/2010  . ADJ DISORDER WITH MIXED ANXIETY & DEPRESSED MOOD 08/23/2009  . BACK PAIN 10/20/2008  . UNS ADVRS EFF OTH RX MEDICINAL&BIOLOGICAL SBSTNC 02/04/2008  . GOUT 08/02/2007  . ASTHMA 07/22/2007  . B12 deficiency 04/06/2007  . CARPAL TUNNEL SYNDROME 04/06/2007    Past Surgical History:  Procedure Laterality Date  . BACK SURGERY   2012   Dr. Dorina Hoyer disk  . CARPAL TUNNEL RELEASE  2008   left-ulnar nerve decomp  . carpel tunnel Left   . CHOLECYSTECTOMY N/A 03/21/2016   Procedure: LAPAROSCOPIC CHOLECYSTECTOMY WITH INTRAOPERATIVE CHOLANGIOGRAM;  Surgeon: Ralene Ok, MD;  Location: [facility];  Service: General;  Laterality: N/A;  . colonscopy     . EPIDURAL BLOCK INJECTION     by Dr. Nelva Bush  . FOOT SURGERY     bilat for removal of extra bone   . GASTRIC BYPASS  11/06/09   beginning weight 267  . HARDWARE REMOVAL N/A 05/02/2016   Procedure: Removal of hardware Lumbar five-Sacral one with Metrex;  Surgeon: Kristeen Miss, MD;  Location: [facility];  Service: Neurosurgery;  Laterality: N/A;  Removal of hardware L5-S1 with Metrex  . HERNIA REPAIR  7035   umbilical  . HIATAL HERNIA REPAIR N/A 01/20/2013   Procedure: LAPAROSCOPIC REPAIR OF INTERNAL HERNIA;  Surgeon: Shann Medal, MD;  Location: [facility];  Service: General;  Laterality: N/A;  . LUMBAR LAMINECTOMY/DECOMPRESSION MICRODISCECTOMY Left 06/07/2014   Procedure: MICRO LUMBAR DECOMPRESSION L5 - S1 ON THE LEFT/L5 FORAMINOTOMY/EXCISION SYNOVIAL CYST  1 LEVEL;  Surgeon: [name], MD;  Location: [facility];  Service: Orthopedics;  Laterality: Left;  [name] MASS EXCISION Right 04/18/2013   Procedure: RIGHT SMALL FINGER SKIN LESION EXCISION;  Surgeon: Jolyn Nap, MD;  Location: [facility];  Service: Orthopedics;  Laterality: Right;  . NASAL SEPTOPLASTY W/ TURBINOPLASTY     x 2  . NEPHROLITHOTOMY Right 11/03/2014   Procedure: RIGHT PERCUTANEOUS NEPHROLITHOTOMY;  Surgeon: Claybon Jabs, MD;  Location: [facility];  Service: Urology;  Laterality: Right;  . SHOULDER ARTHROSCOPY  2014   rt  . surgerical repair of stomach ulcer  06/08/10   per  . TONSILLECTOMY    . UVULOPALATOPHARYNGOPLASTY  2007   with tonsils  . VASECTOMY      Prior to Admission medications   Medication Sig Start Date End Date Taking? Authorizing Provider  acetaminophen (TYLENOL ARTHRITIS  PAIN) 650 MG CR tablet Take 650 mg by mouth every 8 (eight) hours as needed for pain.    [provider]  albuterol (PROVENTIL HFA;VENTOLIN HFA) 108 (90 Base) MCG/ACT inhaler Inhale 2 puffs into the lungs every 6 (six) hours as needed for wheezing or shortness of breath. 09/03/18   Caren Macadam, MD  apixaban (ELIQUIS) 5 MG TABS tablet Take 2 tablets (10 mg) PO BID x 7 days, then reduce dose to 1 tablet (5 mg) PO BID. 09/19/18   Hinda Kehr, MD  Cholecalciferol (VITAMIN D-1000 MAX ST) 1000 units tablet Take 1,000 Units by mouth daily.    [provider]  escitalopram (LEXAPRO) 20 MG tablet Take 20 mg by mouth every morning.     [provider]  lamoTRIgine (LAMICTAL) 100 MG tablet Take 100-200 mg by mouth 2 (  two) times daily. Take 200 mg in the am and 100 mg qhs.    [provider]  lisinopril (PRINIVIL,ZESTRIL) 10 MG tablet Take 1 tablet (10 mg total) by mouth daily. May increase to 20mg  if needed. Send BP readings in 1 month. 12/29/17   Panosh, Standley Brooking, MD  Multiple Vitamin (MULTIVITAMIN) tablet Take 1 tablet by mouth daily.    [provider]  oxyCODONE-acetaminophen (PERCOCET) 10-325 MG tablet Take 1 tablet by mouth every 6 (six) hours as needed for pain. 07/10/18 07/10/19  [name], PA-C  predniSONE (DELTASONE) 10 MG tablet Take 3 tablets once a day for 5 days 07/10/18   [name], PA-C  Spacer/Aero-Holding Chambers (E-Z SPACER) inhaler Use as instructed 09/03/18   Caren Macadam, MD  tamsulosin (FLOMAX) 0.4 MG CAPS capsule Take 1 capsule (0.4 mg total) by mouth daily. 10/29/17   Stoioff, Ronda Fairly, MD  traZODone (DESYREL) 100 MG tablet Take 300 mg by mouth at bedtime.     [provider]  vitamin B-12 (CYANOCOBALAMIN) 1000 MCG tablet Take 1,000 mcg by mouth daily.    [provider]    Allergies Aspirin; Cymbalta [duloxetine hcl]; Tape; Nsaids; Hydrocodone; and Vancomycin  Family History  Problem Relation  Age of Onset  . Uterine cancer Mother   . Hypertension Mother   . Cervical cancer Mother   . Heart disease Father   . COPD Father   . Colon cancer Maternal Uncle   . Colon cancer Paternal Aunt     Social History Social History   Tobacco Use  . Smoking status: Never Smoker  . Smokeless tobacco: Never Used  Substance Use Topics  . Alcohol use: Yes    Comment: occas liquor   . Drug use: No    Review of Systems  Constitutional: No fever/chills Eyes: No visual changes. ENT: No sore throat. Cardiovascular: See HPI Respiratory: See HPI. Gastrointestinal: No abdominal pain.  No nausea, no vomiting.  No diarrhea.  No constipation. Genitourinary: Negative for dysuria. Musculoskeletal: Negative for back pain. Skin: Negative for rash. Neurological: Negative for headaches, focal weakness ____________________________________________   PHYSICAL EXAM:  VITAL SIGNS: ED Triage Vitals  Enc Vitals Group     BP 09/26/18 1504 112/68     Pulse Rate 09/26/18 1504 87     Resp 09/26/18 1504 14     Temp 09/26/18 1504 98.4 F (36.9 C)     Temp Source 09/26/18 1504 Oral     SpO2 09/26/18 1504 100 %     Weight 09/26/18 1508 181 lb (82.1 kg)     Height 09/26/18 1508 5\' 10"  (1.778 m)     Head Circumference --      Peak Flow --      Pain Score 09/26/18 1508 8     Pain Loc --      Pain Edu? --      Excl. in [place]? --     Constitutional: Alert and oriented.  Looks anxious and breathing hard Eyes: Conjunctivae are normal.  Head: Atraumatic. Nose: No congestion/rhinnorhea. Mouth/Throat: Mucous membranes are moist.  Oropharynx non-erythematous. Neck: No stridor. Cardiovascular: Normal rate, regular rhythm. Grossly normal heart sounds.  Good peripheral circulation. Respiratory: Normal respiratory effort.  No retractions. Lungs CTAB. Gastrointestinal: Soft and nontender. No distention. No abdominal bruits. No CVA tenderness. Musculoskeletal: No lower extremity tenderness nor edema.     Neurologic:  Normal speech and language. No gross focal neurologic deficits are appreciated. Skin:  Skin is  warm, dry and intact. No rash noted. Psychiatric: Mood and affect are normal. Speech and behavior are normal.  ____________________________________________   LABS (all labs ordered are listed, but only abnormal results are displayed)  Labs Reviewed  BASIC METABOLIC PANEL - Abnormal; Notable for the following components:      Result Value   Potassium 3.4 (*)    Calcium 8.8 (*)    All other components within normal limits  CBC - Abnormal; Notable for the following components:   RBC 3.78 (*)    MCV 103.2 (*)    MCH 35.4 (*)    Platelets 147 (*)    All other components within normal limits  TROPONIN I   ____________________________________________  EKG EKG read and interpreted by me shows normal sinus rhythm rate of 74 left axis there are flipped T waves in 3 and F and V45 and 6 which are different in appearance than previously last week.  ____________________________________________  RADIOLOGY  ED MD interpretation: Chest x-ray read by me does not show any acute changes waiting for radiology  Official radiology report(s): Dg Chest 2 View  Result Date: 09/26/2018 CLINICAL DATA:  Shortness of breath today. Recently diagnosed with pulmonary embolism. History of hypertension and diabetes. EXAM: CHEST - 2 VIEW COMPARISON:  Radiographs 09/18/2018.  CT 09/18/2018. FINDINGS: The heart size and mediastinal contours are normal. The lungs are stable with minimal basilar scarring or atelectasis. There is no pleural effusion or pneumothorax. No acute osseous findings are identified. Telemetry leads overlie the chest. There are mild degenerative changes throughout the spine. IMPRESSION: Stable chest.  No acute cardiopulmonary process. Electronically Signed   By: Richardean Sale M.D.   On: 09/26/2018 16:00   Ct Angio Chest Pe W And/or Wo Contrast  Result Date: 09/26/2018 CLINICAL DATA:   Shortness of breath.  Diagnosed with PE last week. EXAM: CT ANGIOGRAPHY CHEST WITH CONTRAST TECHNIQUE: Multidetector CT imaging of the chest was performed using the standard protocol during bolus administration of intravenous contrast. Multiplanar CT image reconstructions and MIPs were obtained to evaluate the vascular anatomy. CONTRAST:  38mL OMNIPAQUE IOHEXOL 350 MG/ML SOLN COMPARISON:  09/18/2018 FINDINGS: Cardiovascular: The heart size is normal. No substantial pericardial effusion. Atherosclerotic calcification is noted in the wall of the thoracic aorta. There is persistent but decreased small volume clot burden in pulmonary arteries to the right upper and lower lobes, as before. No new additional on today's exam. Pulmonary embolus evident Mediastinum/Nodes: No mediastinal lymphadenopathy. Cluster of nodular soft tissue attenuation in the anterior mediastinum is stable since recent comparison and comparing back to 05/28/2017. There is no hilar lymphadenopathy. The esophagus has normal imaging features. There is no axillary lymphadenopathy. Lungs/Pleura: Compressive atelectasis noted in the lower lungs bilaterally. No pleural effusion. Upper Abdomen: The liver shows diffusely decreased attenuation suggesting steatosis. Status post gastric bypass. Musculoskeletal: No worrisome lytic or sclerotic osseous abnormality. Nonacute left rib fractures again noted. Review of the MIP images confirms the above findings. IMPRESSION: 1. Previously identified small volume pulmonary embolus in the right lung has decreased slightly in the interval. No new pulmonary embolic disease on today's exam. 2. Compressive atelectasis in the dependent lungs without pleural effusion. 3. Hepatic steatosis 4.  Aortic Atherosclerois (ICD10-170.0) Electronically Signed   By: Misty Stanley M.D.   On: 09/26/2018 17:31    ____________________________________________   PROCEDURES  Procedure(s) performed:   Procedures  Critical Care  performed: Critical care time half an hour this includes reviewing the patient's current EKGs and his old  EKGs and reviewing his CT reports talking to the hospitalist the patient and the patient's wife.  I also discussed with the cardiologist the cardiologist in detail about the EKG   ____________________________________________   INITIAL IMPRESSION / ASSESSMENT AND PLAN / ED COURSE  In view of patient's history and symptoms I will repeat the CT of the chest with contrast.  We are waiting for the EKG to be finished.  Troponin has been drawn.  Patient's symptoms are very suggestive of pulmonary emboli again   Patient with more pain EKG was repeated looks similar to #1.  Discussed with hospitalist and also with Dr. Rayann Heman on call for [facility] cardiology.  EKG does look significantly different from last week and in October.  We will admit this patient consult cardiology tomorrow.          ____________________________________________   FINAL CLINICAL IMPRESSION(S) / ED DIAGNOSES  Final diagnoses:  Chest pain, unspecified type  Abnormal finding on EKG     ED Discharge Orders    None       Note:  This document was prepared using Dragon voice recognition software and may include unintentional dictation errors.    Nena Polio, MD 09/26/18 1753    Nena Polio, MD 09/26/18 1754

## 2018-09-26 NOTE — ED Triage Notes (Signed)
Pt reports that he started having shortness of breath at lunch - pt is going to hematologist Tuesday for PE (dx last Saturday with PE in the ED) - pt appears anxious - c/o chest pain and tightness

## 2018-09-27 ENCOUNTER — Encounter: Payer: Self-pay | Admitting: Physician Assistant

## 2018-09-27 ENCOUNTER — Observation Stay (HOSPITAL_BASED_OUTPATIENT_CLINIC_OR_DEPARTMENT_OTHER): Payer: Federal, State, Local not specified - PPO

## 2018-09-27 ENCOUNTER — Observation Stay (HOSPITAL_BASED_OUTPATIENT_CLINIC_OR_DEPARTMENT_OTHER)
Admit: 2018-09-27 | Discharge: 2018-09-27 | Disposition: A | Discharge status: Home / Self Care | Payer: Federal, State, Local not specified - PPO | Attending: Internal Medicine | Admitting: Internal Medicine

## 2018-09-27 DIAGNOSIS — I2699 Other pulmonary embolism without acute cor pulmonale: Secondary | ICD-10-CM

## 2018-09-27 DIAGNOSIS — I351 Nonrheumatic aortic (valve) insufficiency: Secondary | ICD-10-CM

## 2018-09-27 DIAGNOSIS — R079 Chest pain, unspecified: Secondary | ICD-10-CM | POA: Diagnosis not present

## 2018-09-27 DIAGNOSIS — I10 Essential (primary) hypertension: Secondary | ICD-10-CM | POA: Diagnosis not present

## 2018-09-27 DIAGNOSIS — I719 Aortic aneurysm of unspecified site, without rupture: Secondary | ICD-10-CM

## 2018-09-27 DIAGNOSIS — F329 Major depressive disorder, single episode, unspecified: Secondary | ICD-10-CM | POA: Diagnosis not present

## 2018-09-27 LAB — NM MYOCAR MULTI W/SPECT W/WALL MOTION / EF
LV dias vol: 53 mL (ref 62–150)
LV sys vol: 12 mL
Peak HR: 80 {beats}/min
Percent HR: 49 %
Rest HR: 62 {beats}/min
SDS: 0
SRS: 0
SSS: 2
TID: 0.75

## 2018-09-27 LAB — ECHOCARDIOGRAM COMPLETE
Height: 70 in
WEIGHTICAEL: 3025.6 [oz_av]

## 2018-09-27 LAB — TROPONIN I: Troponin I: <0.03 ng/mL (ref <0.03)

## 2018-09-27 LAB — BASIC METABOLIC PANEL
Anion gap: 5 (ref 5–15)
BUN: 11 mg/dL (ref 6–20)
CALCIUM: 8.3 mg/dL — AB (ref 8.9–10.3)
CHLORIDE: 109 mmol/L (ref 98–111)
CO2: 28 mmol/L (ref 22–32)
Creatinine, Ser: 1.28 mg/dL — ABNORMAL HIGH (ref 0.61–1.24)
GFR calc Af Amer: >60 mL/min (ref >60)
Glucose, Bld: 94 mg/dL (ref 70–99)
Potassium: 4.4 mmol/L (ref 3.5–5.1)
Sodium: 142 mmol/L (ref 135–145)

## 2018-09-27 LAB — CBC
HCT: 36 % — ABNORMAL LOW (ref 39.0–52.0)
Hemoglobin: 12.1 g/dL — ABNORMAL LOW (ref 13.0–17.0)
MCH: 36.1 pg — ABNORMAL HIGH (ref 26.0–34.0)
MCHC: 33.6 g/dL (ref 30.0–36.0)
MCV: 107.5 fL — ABNORMAL HIGH (ref 80.0–100.0)
Platelets: 126 10*3/uL — ABNORMAL LOW (ref 150–400)
RBC: 3.35 MIL/uL — ABNORMAL LOW (ref 4.22–5.81)
RDW: 13.7 % (ref 11.5–15.5)
WBC: 3.7 10*3/uL — ABNORMAL LOW (ref 4.0–10.5)
nRBC: 0 % (ref 0.0–0.2)

## 2018-09-27 MED ORDER — TECHNETIUM TC 99M TETROFOSMIN IV KIT
30.0000 | PACK | Freq: Once | INTRAVENOUS | Status: AC | PRN
  Administered 2018-09-27: 32.165 via INTRAVENOUS

## 2018-09-27 MED ORDER — REGADENOSON 0.4 MG/5ML IV SOLN
0.4000 mg | Freq: Once | INTRAVENOUS | Status: AC
  Administered 2018-09-27: 0.4 mg via INTRAVENOUS

## 2018-09-27 MED ORDER — TECHNETIUM TC 99M TETROFOSMIN IV KIT
10.0000 | PACK | Freq: Once | INTRAVENOUS | Status: AC | PRN
  Administered 2018-09-27: 10.41 via INTRAVENOUS

## 2018-09-27 MED ORDER — NITROGLYCERIN 0.3 MG SL SUBL: 0.3000 mg | SUBLINGUAL_TABLET | SUBLINGUAL | 3 refills | Status: AC | PRN

## 2018-09-27 NOTE — Progress Notes (Signed)
*  PRELIMINARY RESULTS* Echocardiogram 2D Echocardiogram has been performed.  Hector [name] 09/27/2018, 11:35 AM

## 2018-09-27 NOTE — Care Management Note (Signed)
Case Management Note  Patient Details  Name: Hector [name] MRN: [id number] Date of Birth: 11/25/1960  Subjective/Objective:      Patient is independent from home.  Placed in observation for chest pain.    Status post ECHO and stress test today; results pending.  Independent in all adls, denies issues accessing medical care, obtaining medications or with transportation.  Current with PCP.  No discharge needs identified at present by care manager or members of care team.              Action/Plan:   Expected Discharge Date:                  Expected Discharge Plan:  Home/Self Care  In-House Referral:     Discharge planning Services  CM Consult  Post Acute Care Choice:    Choice offered to:     DME Arranged:    DME Agency:     HH Arranged:    [place] Agency:     Status of Service:  Completed, signed off  If discussed at [employer] of Stay Meetings, dates discussed:    Additional Comments:  Elza Rafter, RN 09/27/2018, 2:51 PM

## 2018-09-27 NOTE — Progress Notes (Signed)
Patient discharged home this evening after negative stress test. IV removed, AVS given and reviewed, along with nitro prescription and education. Pt to follow up with cardiology after discharge. VSS.

## 2018-09-27 NOTE — Plan of Care (Signed)

## 2018-09-27 NOTE — Discharge Summary (Signed)
Hector [name], is a 58 y.o. male  DOB 24-Jan-1961  MRN [id number].  Admission date:  09/26/2018  Admitting Physician  Dustin Flock, MD  Discharge Date:  09/27/2018   Primary MD  Panosh, Standley Brooking, MD  Recommendations for primary care physician for things to follow:   Follow with PCP in 1 week   Admission Diagnosis  Abnormal finding on EKG [R94.31] Chest pain, unspecified type [R07.9]   Discharge Diagnosis  Abnormal finding on EKG [R94.31] Chest pain, unspecified type [R07.9]    Active Problems:   Chest pain      Past Medical History:  Diagnosis Date  . Abnormal LFTs    improved after gastic bypass  . ADJ DISORDER WITH MIXED ANXIETY & DEPRESSED MOOD 08/23/2009   Qualifier: Diagnosis of  By: [name] MD, Standley Brooking Now on lamictal and lexapro and elevil for sleep.   [name] Anxiety   . B12 nutritional deficiency   . Chest pain    hopsitalized 2 14 evaluation  . Closed head injury    laceration  9 12  no loc  vision change  . DDD (degenerative disc disease)   . Diabetes mellitus    resolved with gastric bypass  . GERD (gastroesophageal reflux disease)    no meds  . Gout   . Headache    once per week uses Indomethicin hx of cluster headaches   . History of bunionectomy of left great toe   . History of concussion   . History of renal stone    Seen in emergency room not urologist  . Hyperlipidemia   . Hypertension 02/27/2017  . OBESITY 12/10/2007   Qualifier: Diagnosis of  By: [name] MD, Standley Brooking Gastric bypass   . OSA (obstructive sleep apnea) 12/12/2013  . Pancreatitis 03/21/2016   went to [place]  . Perforated ulcer [facility])    Surgical repair9/24 2011    Past Surgical History:  Procedure Laterality Date  . BACK SURGERY  2012   Dr. Dorina Hoyer disk  . CARPAL TUNNEL RELEASE  2008   left-ulnar nerve decomp  .  carpel tunnel Left   . CHOLECYSTECTOMY N/A 03/21/2016   Procedure: LAPAROSCOPIC CHOLECYSTECTOMY WITH INTRAOPERATIVE CHOLANGIOGRAM;  Surgeon: Ralene Ok, MD;  Location: [facility];  Service: General;  Laterality: N/A;  . colonscopy     . EPIDURAL BLOCK INJECTION     by Dr. Nelva Bush  . FOOT SURGERY     bilat for removal of extra bone   . GASTRIC BYPASS  11/06/09   beginning weight 267  . HARDWARE REMOVAL N/A 05/02/2016   Procedure: Removal of hardware Lumbar five-Sacral one with Metrex;  Surgeon: Kristeen Miss, MD;  Location: [place] [facility];  Service: Neurosurgery;  Laterality: N/A;  Removal of hardware L5-S1 with Metrex  . HERNIA REPAIR  [id number]   umbilical  . HIATAL HERNIA REPAIR N/A 01/20/2013   Procedure: LAPAROSCOPIC REPAIR OF INTERNAL HERNIA;  Surgeon: Shann Medal, MD;  Location: [facility];  Service: General;  Laterality: N/A;  . LUMBAR LAMINECTOMY/DECOMPRESSION MICRODISCECTOMY Left 06/07/2014   Procedure: MICRO LUMBAR DECOMPRESSION L5 - S1 ON THE LEFT/L5 FORAMINOTOMY/EXCISION SYNOVIAL CYST  1 LEVEL;  Surgeon: [name], MD;  Location: [facility];  Service: Orthopedics;  Laterality: Left;  [name] MASS EXCISION Right 04/18/2013   Procedure: RIGHT SMALL FINGER SKIN LESION EXCISION;  Surgeon: Jolyn Nap, MD;  Location: [place];  Service: Orthopedics;  Laterality: Right;  . NASAL SEPTOPLASTY W/ TURBINOPLASTY  x 2  . NEPHROLITHOTOMY Right 11/03/2014   Procedure: RIGHT PERCUTANEOUS NEPHROLITHOTOMY;  Surgeon: Claybon Jabs, MD;  Location: [facility];  Service: Urology;  Laterality: Right;  . SHOULDER ARTHROSCOPY  2014   rt  . surgerical repair of stomach ulcer  06/08/10   per  . TONSILLECTOMY    . UVULOPALATOPHARYNGOPLASTY  2007   with tonsils  . VASECTOMY         History of present illness and  Hospital Course:     Kindly see H&P for history of present illness and admission details, please review complete Labs, Consult reports and Test reports for all details in brief  HPI   from the history and physical done on the day of admission  58 year old male with no prior cardiac history, history of small right-sided PE on Eliquis comes in because of chest pain, admitted for the same.  Hospital Course   #1. substernal chest pain, patient troponins have been negative x3, EKG did not show acute changes, patient is seen by cardiology from [facility], patient will get Lexiscan stress test, if it is negative for any ischemia patient can go home.  Patient likely has chest pain from underlying PE.  Started on aspirin at admission, does not need aspirin at discharge ,  #2 history of recent PE, continue Eliquis. 3.  Ascending aortic aneurysm, size 4.1 cm, patient is followed by CT surgeon.  Recommend any intervention, patient needs annual screening, genetic screening for children as an outpatient.  #4 essential hypertension: Controlled 5.  Hyperlipidemia: Patient can be started on Lipitor. Will give a limited supply of sublingual nitro for his chest pain he can take as needed.  Discharge Condition: Stable   Follow UP      Discharge Instructions  and  Discharge Medications      Allergies as of 09/27/2018      Reactions   Aspirin Other (See Comments)   perforated ulcer   Cymbalta [duloxetine Hcl] Other (See Comments)   Over-sedating; slept over 24 hours with one dose.  Possible serotonin syndrome.   Tape Rash   Surgical tape   Nsaids Other (See Comments)   ulcer Other reaction(s): Other (See Comments) Perforated ulcer   Hydrocodone Other (See Comments)   headache   Vancomycin Rash   Red man syndrome.  Please premedicate.      Medication List    STOP taking these medications   TYLENOL ARTHRITIS PAIN 650 MG CR tablet Generic drug:  acetaminophen     TAKE these medications   albuterol 108 (90 Base) MCG/ACT inhaler Commonly known as:  PROVENTIL HFA;VENTOLIN HFA Inhale 2 puffs into the lungs every 6 (six) hours as needed for wheezing or  shortness of breath.   apixaban 5 MG Tabs tablet Commonly known as:  ELIQUIS Take 2 tablets (10 mg) PO BID x 7 days, then reduce dose to 1 tablet (5 mg) PO BID. What changed:    how much to take  how to take this  when to take this  additional instructions   E-Z SPACER inhaler Use as instructed   escitalopram 20 MG tablet Commonly known as:  LEXAPRO Take 20 mg by mouth every morning.   lamoTRIgine 100 MG tablet Commonly known as:  LAMICTAL Take 100 mg by mouth 2 (two) times daily.   lisinopril 10 MG tablet Commonly known as:  PRINIVIL,ZESTRIL Take 1 tablet (10 mg total) by mouth daily. May increase to 20mg  if needed. Send BP readings in  1 month.   multivitamin tablet Take 1 tablet by mouth daily.   nitroGLYCERIN 0.3 MG SL tablet Commonly known as:  NITROSTAT Place 1 tablet (0.3 mg total) under the tongue every 5 (five) minutes as needed for chest pain.   oxyCODONE-acetaminophen 10-325 MG tablet Commonly known as:  PERCOCET Take 1 tablet by mouth every 6 (six) hours as needed for pain.   predniSONE 10 MG tablet Commonly known as:  DELTASONE Take 3 tablets once a day for 5 days   tamsulosin 0.4 MG Caps capsule Commonly known as:  FLOMAX Take 1 capsule (0.4 mg total) by mouth daily. What changed:    when to take this  reasons to take this   traZODone 100 MG tablet Commonly known as:  DESYREL Take 300 mg by mouth at bedtime.   vitamin B-12 1000 MCG tablet Commonly known as:  CYANOCOBALAMIN Take 1,000 mcg by mouth daily.   VITAMIN D-1000 MAX ST 25 MCG (1000 UT) tablet Generic drug:  Cholecalciferol Take 1,000 Units by mouth daily.         Diet and Activity recommendation: See Discharge Instructions above   Consults obtained -cardiology   Major procedures and Radiology Reports - PLEASE review detailed and final reports for all details, in brief -     Dg Chest 2 View  Result Date: 09/26/2018 CLINICAL DATA:  Shortness of breath today.  Recently diagnosed with pulmonary embolism. History of hypertension and diabetes. EXAM: CHEST - 2 VIEW COMPARISON:  Radiographs 09/18/2018.  CT 09/18/2018. FINDINGS: The heart size and mediastinal contours are normal. The lungs are stable with minimal basilar scarring or atelectasis. There is no pleural effusion or pneumothorax. No acute osseous findings are identified. Telemetry leads overlie the chest. There are mild degenerative changes throughout the spine. IMPRESSION: Stable chest.  No acute cardiopulmonary process. Electronically Signed   By: Richardean Sale M.D.   On: 09/26/2018 16:00   Dg Chest 2 View  Result Date: 09/18/2018 CLINICAL DATA:  Pneumonia EXAM: CHEST - 2 VIEW COMPARISON:  07/05/2018 FINDINGS: Cardiac and mediastinal margins appear normal. Thoracic spondylosis. Left lower lateral rib deformities from prior fractures. Mild scarring or atelectasis in the left lower lobe and lingula. The lungs appear otherwise clear. IMPRESSION: 1. Minimal left lower lobe and lingular scarring or atelectasis. This is similar to prior exams. 2. No new airspace opacity. 3. Thoracic spondylosis. Electronically Signed   By: Van Clines M.D.   On: 09/18/2018 19:47   Ct Angio Chest Pe W And/or Wo Contrast  Result Date: 09/26/2018 CLINICAL DATA:  Shortness of breath.  Diagnosed with PE last week. EXAM: CT ANGIOGRAPHY CHEST WITH CONTRAST TECHNIQUE: Multidetector CT imaging of the chest was performed using the standard protocol during bolus administration of intravenous contrast. Multiplanar CT image reconstructions and MIPs were obtained to evaluate the vascular anatomy. CONTRAST:  37mL OMNIPAQUE IOHEXOL 350 MG/ML SOLN COMPARISON:  09/18/2018 FINDINGS: Cardiovascular: The heart size is normal. No substantial pericardial effusion. Atherosclerotic calcification is noted in the wall of the thoracic aorta. There is persistent but decreased small volume clot burden in pulmonary arteries to the right upper and lower  lobes, as before. No new additional on today's exam. Pulmonary embolus evident Mediastinum/Nodes: No mediastinal lymphadenopathy. Cluster of nodular soft tissue attenuation in the anterior mediastinum is stable since recent comparison and comparing back to 05/28/2017. There is no hilar lymphadenopathy. The esophagus has normal imaging features. There is no axillary lymphadenopathy. Lungs/Pleura: Compressive atelectasis noted in the lower lungs bilaterally. No  pleural effusion. Upper Abdomen: The liver shows diffusely decreased attenuation suggesting steatosis. Status post gastric bypass. Musculoskeletal: No worrisome lytic or sclerotic osseous abnormality. Nonacute left rib fractures again noted. Review of the MIP images confirms the above findings. IMPRESSION: 1. Previously identified small volume pulmonary embolus in the right lung has decreased slightly in the interval. No new pulmonary embolic disease on today's exam. 2. Compressive atelectasis in the dependent lungs without pleural effusion. 3. Hepatic steatosis 4.  Aortic Atherosclerois (ICD10-170.0) Electronically Signed   By: Misty Stanley M.D.   On: 09/26/2018 17:31   Ct Angio Chest Pe W/cm &/or Wo Cm  Result Date: 09/19/2018 CLINICAL DATA:  Initial evaluation for acute dyspnea. Shortness of breath. EXAM: CT ANGIOGRAPHY CHEST WITH CONTRAST TECHNIQUE: Multidetector CT imaging of the chest was performed using the standard protocol during bolus administration of intravenous contrast. Multiplanar CT image reconstructions and MIPs were obtained to evaluate the vascular anatomy. CONTRAST:  42mL OMNIPAQUE IOHEXOL 350 MG/ML SOLN COMPARISON:  Prior radiograph from earlier the same day. FINDINGS: Cardiovascular: Intrathoracic aorta of normal caliber without aneurysm or other acute finding. Mild atherosclerosis within the aortic arch. Visualized great vessels within normal limits. Heart size normal. No pericardial effusion. Pulmonary arterial tree adequately  opacified for evaluation. Main pulmonary artery measures within normal limits for caliber. Focal filling defect within right lower lobe segmental arterial branch, consistent with acute pulmonary embolism (series 7, image 161). Few additional tiny filling defects noted distally within right lower lobe subsegmental branches. Probable minimal thrombus within anterior right upper lobe segmental branch (series 7, image 101). No other significant filling defects identified. RV to LV ratio within normal limits without evidence for right heart strain. Mediastinum/Nodes: Visualized thyroid within normal limits. No pathologically enlarged mediastinal, hilar, or axillary lymph nodes identified. Small amount of layering fluid noted within the upper esophageal lumen. Small hiatal hernia noted. Lungs/Pleura: Tracheobronchial tree intact and patent. Lungs well inflated bilaterally. Mild linear atelectasis and/or scarring noted within the lingula and left lower lobe. No consolidative opacity to suggest pneumonia. No pulmonary edema or pleural effusion. No pneumothorax. No worrisome pulmonary nodule or mass. Upper Abdomen: Calcified granuloma noted within the posterior right hepatic lobe. Hepatic steatosis noted. 17 mm hypodensity within the central aspect of the liver suggestive of a small cyst. Liver otherwise unremarkable. Sequelae of prior gastric bypass noted. Small hiatal hernia. Remainder the visualized upper abdomen otherwise unremarkable. Musculoskeletal: No acute osseous abnormality. No lytic or blastic osseous lesions. Subacute healing fractures of the left lateral seventh, eighth, and ninth ribs noted. External soft tissues unremarkable. Review of the MIP images confirms the above findings. IMPRESSION: 1. Acute small volume pulmonary embolism involving primarily right lower lobe segmental arterial branch. No evidence for right heart strain. 2. No other acute cardiopulmonary abnormality identified. 3. Subacute healing  fractures of the left lateral seventh, eighth, and ninth ribs. 4. Hepatic steatosis. 5. Small hiatal hernia. Critical Value/emergent results were called by telephone at the time of interpretation on 09/19/2018 at 12:22 am to Dr. Hinda Kehr , who verbally acknowledged these results. Electronically Signed   By: Jeannine Boga M.D.   On: 09/19/2018 00:26   Mr Abdomen Mrcp Moise Boring Contast  Result Date: 09/06/2018 CLINICAL DATA:  Followup pancreatic cyst seen on recent chest CT. EXAM: MRI ABDOMEN WITHOUT AND WITH CONTRAST (INCLUDING MRCP) TECHNIQUE: Multiplanar multisequence MR imaging of the abdomen was performed both before and after the administration of intravenous contrast. Heavily T2-weighted images of the biliary and pancreatic ducts were  obtained, and three-dimensional MRCP images were rendered by post processing. CONTRAST:  60mL MULTIHANCE GADOBENATE DIMEGLUMINE 529 MG/ML IV SOLN COMPARISON:  Multiple prior CT scans dating back to 2012. FINDINGS: Lower chest: The lung bases are clear. No pulmonary nodules or pleural effusion. The heart is normal in size. No pericardial effusion. Hepatobiliary: Advanced fatty infiltration of the liver with significant loss of signal intensity on the out of phase images gradient echo T1 weighted images. No focal hepatic lesions or intrahepatic biliary dilatation. The portal and hepatic veins are normal. The gallbladder is surgically absent. No common bile duct dilatation. Normal caliber and course of the common bile duct. Pancreas: There is a 16.5 mm curvilinear cystic lesion in the uncinate process region of the pancreas. When looking back at multiple prior studies this has been present since 2012. No enhancement is identified. The main pancreatic duct appears normal. The remainder of the pancreas is normal. Spleen:  Normal size.  No focal lesions. Adrenals/Urinary Tract:  The adrenal glands and kidneys are normal. Stomach/Bowel: Stable surgical changes related to gastric  bypass surgery. No complicating features. Visualized portions within the abdomen are unremarkable. Vascular/Lymphatic: No pathologically enlarged lymph nodes identified. No abdominal aortic aneurysm demonstrated. Other:  No ascites or abdominal wall hernia. Musculoskeletal: No significant bony findings. IMPRESSION: 1. Stable cystic area in the uncinate process region the pancreas with somewhat of a curvilinear tail. 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 It has been present dating back to abdominal CT scans from 2012. This is certainly consistent with benign process and I do not think it needs any further imaging evaluation or follow-up. 2. Status post cholecystectomy. No intra or extrahepatic biliary dilatation. 3. Surgical changes from gastric bypass surgery. No complicating features. 4. Diffuse fatty infiltration of the liver. Electronically Signed   By: Marijo Sanes M.D.   On: 09/06/2018 12:19    Micro Results   No results found for this or any previous visit (from the past 240 hour(s)).     Today   Subjective:   Hector [name] today has no headache,no chest abdominal pain,no new weakness tingling or numbness, feels much better wants to go home today.   Objective:   Blood pressure 122/87, pulse 64, temperature 98.2 F (36.8 C), temperature source Oral, resp. rate 16, height 5\' 10"  (1.778 m), weight 85.8 kg, SpO2 99 %.   Intake/Output Summary (Last 24 hours) at 09/27/2018 0948 Last data filed at 09/26/2018 2212 Gross per 24 hour  Intake 3 ml  Output 0 ml  Net 3 ml    Exam Awake Alert, Oriented x 3, No new F.N deficits, Normal affect [place].AT,PERRAL Supple Neck,No JVD, No cervical lymphadenopathy appriciated.  Symmetrical Chest wall movement, Good air movement bilaterally, CTAB RRR,No Gallops,Rubs or new Murmurs, No Parasternal Heave +ve B.Sounds, Abd Soft, Non tender, No organomegaly appriciated, No rebound -guarding or rigidity. No  Cyanosis, Clubbing or edema, No new Rash or bruise  Data Review   CBC w Diff:  Lab Results  Component Value Date   WBC 3.7 (L) 09/27/2018   HGB 12.1 (L) 09/27/2018   HGB 13.3 09/27/2012   HCT 36.0 (L) 09/27/2018   HCT 38.7 09/27/2012   PLT 126 (L) 09/27/2018   PLT 227 09/27/2012   LYMPHOPCT 18 07/10/2018   LYMPHOPCT 34.9 09/27/2012   MONOPCT 8 07/10/2018   MONOPCT 7.4 09/27/2012   EOSPCT 1 07/10/2018   EOSPCT 2.2 09/27/2012   BASOPCT 0 07/10/2018  BASOPCT 0.3 09/27/2012    CMP:  Lab Results  Component Value Date   NA 142 09/27/2018   K 4.4 09/27/2018   CL 109 09/27/2018   CO2 28 09/27/2018   BUN 11 09/27/2018   CREATININE 1.28 (H) 09/27/2018   PROT 6.6 07/10/2018   ALBUMIN 3.9 07/10/2018   BILITOT 0.6 07/10/2018   ALKPHOS 76 07/10/2018   AST 40 07/10/2018   ALT 22 07/10/2018  .   Total Time in preparing paper work, data evaluation and todays exam - 35 minutes  Epifanio Lesches M.D on 09/27/2018 at 9:48 AM    Note: This dictation was prepared with Dragon dictation along with smaller phrase technology. Any transcriptional errors that result from this process are unintentional.

## 2018-09-27 NOTE — Consult Note (Signed)
Cardiology Consultation:   Patient ID: CHAY MAZZONI; [id number]; Aug 13, 1961   Admit date: 09/26/2018 Date of Consult: 09/27/2018  Primary Care Provider: Burnis Medin, MD Primary Cardiologist: new to [facility] - consult by Fletcher Anon   Patient Profile:   Hector [name] is a 58 y.o. 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 Posey Pronto.  History of Present Illness:   Hector [name] previously underwent nuclear stress testing in 2007 that was normal. Prior echo in 2014 showed an EF of 60-65%, no RWMA, Gr1DD, mild AI, mildly dilated RA/RV, atrial septal aneurysm. He was seen in 06/2018 with chest wall pain stemming from a fall. He was diagnosed with a closed rib fracture on repeat ED visit on 07/10/2018. CT of the chest at that time noted an incidental ascending aortic aneurysm measuring 4.1 cm. Following this, in 08/2018 he was seen by his PCP in 08/2018 with cough and diagnosed with PNA status post 2 courses of antibiotics without symptom improvement He was seen in the ED on 09/18/2018 with exertional SOB. CTA of the chest showed a small volume PE. He was started on Eliquis and advised to follow up with PCP and hematology.   Since being diagnosed with the above PE, he has continued to note intermittent shortness of breath and diffuse anterior wall chest tightness.  Shortness of breath is exacerbated with exertion however chest tightness is unrelated.  On 09/26/2018 patient developed chest tightness or shortness of breath that appeared to be worse than when he presented to the ED on 1/4 prompting him to come in for  evaluation.  He denies missing any doses of Eliquis.  No lower extremity swelling.  Upon the patient's arrival to [facility] they were found to have stable vitals. EKG showed NSR with inferolateral TWI, CXR showed no acute cardiopulmonary process. CTA chest showed a previously identified small volume PE in the right lung that has slightly decreased in interval without any new pulmonary embolic disease. Labs showed troponin negative x 4, K+ 3.4-->4.4, SCr 1.23-->1.28, WBC 5.2-->3.7, HGB 13.4-->12.1, PLT 147-->126.  Chest pain-free.  Past Medical History:  Diagnosis Date  . Abnormal LFTs    improved after gastic bypass  . ADJ DISORDER WITH MIXED ANXIETY & DEPRESSED MOOD 08/23/2009   Qualifier: Diagnosis of  By: [name] MD, Standley Brooking Now on lamictal and lexapro and elevil for sleep.   . Anginal pain ([place])   . Anxiety   . B12 nutritional deficiency   . Chest pain    hopsitalized 2 14 evaluation  . Closed head injury    laceration  9 12  no loc  vision change  . DDD (degenerative disc disease)   . Diabetes mellitus    resolved with gastric bypass  . DIABETES MELLITUS, TYPE II 04/06/2007   Qualifier: Diagnosis of  By: [name] MD, Standley Brooking Improved and off meds since gastric bypass   . GERD (gastroesophageal reflux disease)    no meds  . Gout   . Headache    once per week uses Indomethicin hx of cluster headaches   . History of bunionectomy  of left great toe   . History of concussion   . History of renal stone    Seen in emergency room not urologist  . Hyperlipidemia   . Hypertension 02/27/2017  . OBESITY 12/10/2007   Qualifier: Diagnosis of  By: [name] MD, Standley Brooking Gastric bypass   . OBSTRUCTIVE SLEEP APNEA 07/22/2007   Qualifier: Diagnosis of  By: [name] RN, Doroteo Bradford    . OSA (obstructive sleep apnea) 12/12/2013  . Pancreatitis 03/21/2016   went to [place]  . Perforated ulcer ([place])    Surgical repair9/24 2011  . Sleep apnea    had gastric bypass/ sa-gone  . Sleep apnea, primary central  04/24/2014    Past Surgical History:  Procedure Laterality Date  . BACK SURGERY  2012   Dr. Dorina Hoyer disk  . CARPAL TUNNEL RELEASE  2008   left-ulnar nerve decomp  . carpel tunnel Left   . CHOLECYSTECTOMY N/A 03/21/2016   Procedure: LAPAROSCOPIC CHOLECYSTECTOMY WITH INTRAOPERATIVE CHOLANGIOGRAM;  Surgeon: Ralene Ok, MD;  Location: [facility];  Service: General;  Laterality: N/A;  . colonscopy     . EPIDURAL BLOCK INJECTION     by Dr. Nelva Bush  . FOOT SURGERY     bilat for removal of extra bone   . GASTRIC BYPASS  11/06/09   beginning weight 267  . HARDWARE REMOVAL N/A 05/02/2016   Procedure: Removal of hardware Lumbar five-Sacral one with Metrex;  Surgeon: Kristeen Miss, MD;  Location: [place];  Service: Neurosurgery;  Laterality: N/A;  Removal of hardware L5-S1 with Metrex  . HERNIA REPAIR  3790   umbilical  . HIATAL HERNIA REPAIR N/A 01/20/2013   Procedure: LAPAROSCOPIC REPAIR OF INTERNAL HERNIA;  Surgeon: Shann Medal, MD;  Location: [facility];  Service: General;  Laterality: N/A;  . LUMBAR LAMINECTOMY/DECOMPRESSION MICRODISCECTOMY Left 06/07/2014   Procedure: MICRO LUMBAR DECOMPRESSION L5 - S1 ON THE LEFT/L5 FORAMINOTOMY/EXCISION SYNOVIAL CYST  1 LEVEL;  Surgeon: [name], MD;  Location: [facility];  Service: Orthopedics;  Laterality: Left;  [name] MASS EXCISION Right 04/18/2013   Procedure: RIGHT SMALL FINGER SKIN LESION EXCISION;  Surgeon: Jolyn Nap, MD;  Location: [place];  Service: Orthopedics;  Laterality: Right;  . NASAL SEPTOPLASTY W/ TURBINOPLASTY     x 2  . NEPHROLITHOTOMY Right 11/03/2014   Procedure: RIGHT PERCUTANEOUS NEPHROLITHOTOMY;  Surgeon: Claybon Jabs, MD;  Location: [facility];  Service: Urology;  Laterality: Right;  . SHOULDER ARTHROSCOPY  2014   rt  . surgerical repair of stomach ulcer  06/08/10   per  . TONSILLECTOMY    . UVULOPALATOPHARYNGOPLASTY  2007   with tonsils  . VASECTOMY       Home Meds: Prior to Admission medications     Medication Sig Start Date End Date Taking? Authorizing Provider  acetaminophen (TYLENOL ARTHRITIS PAIN) 650 MG CR tablet Take 650 mg by mouth every 8 (eight) hours as needed for pain.   Yes [provider]  albuterol (PROVENTIL HFA;VENTOLIN HFA) 108 (90 Base) MCG/ACT inhaler Inhale 2 puffs into the lungs every 6 (six) hours as needed for wheezing or shortness of breath. 09/03/18  Yes Koberlein, Steele Berg, MD  apixaban (ELIQUIS) 5 MG TABS tablet Take 2 tablets (10 mg) PO BID x 7 days, then reduce dose to 1 tablet (5 mg) PO BID. Patient taking differently: Take 5 mg by mouth 2 (two) times daily.  09/19/18  Yes Hinda Kehr, MD  Cholecalciferol (VITAMIN D-1000 MAX ST) 1000  units tablet Take 1,000 Units by mouth daily.   Yes [provider]  escitalopram (LEXAPRO) 20 MG tablet Take 20 mg by mouth every morning.    Yes [provider]  lamoTRIgine (LAMICTAL) 100 MG tablet Take 100 mg by mouth 2 (two) times daily.    Yes [provider]  lisinopril (PRINIVIL,ZESTRIL) 10 MG tablet Take 1 tablet (10 mg total) by mouth daily. May increase to 20mg  if needed. Send BP readings in 1 month. 12/29/17  Yes Panosh, Standley Brooking, MD  Multiple Vitamin (MULTIVITAMIN) tablet Take 1 tablet by mouth daily.   Yes [provider]  Spacer/Aero-Holding Chambers (E-Z SPACER) inhaler Use as instructed 09/03/18  Yes Koberlein, Junell C, MD  tamsulosin (FLOMAX) 0.4 MG CAPS capsule Take 1 capsule (0.4 mg total) by mouth daily. Patient taking differently: Take 0.4 mg by mouth daily as needed (stone symptoms).  10/29/17  Yes Stoioff, Ronda Fairly, MD  traZODone (DESYREL) 100 MG tablet Take 300 mg by mouth at bedtime.    Yes [provider]  vitamin B-12 (CYANOCOBALAMIN) 1000 MCG tablet Take 1,000 mcg by mouth daily.   Yes [provider]  oxyCODONE-acetaminophen (PERCOCET) 10-325 MG tablet Take 1 tablet by mouth every 6 (six) hours as needed for pain. 07/10/18 07/10/19  [name], PA-C  predniSONE (DELTASONE) 10 MG tablet Take 3 tablets once a day for 5 days 07/10/18   [name], PA-C    Inpatient Medications: Scheduled Meds: . apixaban  5 mg Oral BID  . aspirin EC  81 mg Oral Daily  . cholecalciferol  1,000 Units Oral Daily  . escitalopram  20 mg Oral q morning - 10a  . lamoTRIgine  100 mg Oral BID  . lisinopril  10 mg Oral Daily  . multivitamin with minerals  1 tablet Oral Daily  . sodium chloride flush  3 mL Intravenous Q12H  . traZODone  300 mg Oral QHS  . vitamin B-12  1,000 mcg Oral Daily   Continuous Infusions: . sodium chloride     PRN Meds: sodium chloride, acetaminophen **OR** acetaminophen, albuterol, HYDROcodone-acetaminophen, ondansetron **OR** ondansetron (ZOFRAN) IV, sodium chloride flush, traMADol  Allergies:   Allergies  Allergen Reactions  . Aspirin Other (See Comments)    perforated ulcer  . Cymbalta [Duloxetine Hcl] Other (See Comments)    Over-sedating; slept over 24 hours with one dose.  Possible serotonin syndrome.  . Tape Rash    Surgical tape  . Nsaids Other (See Comments)    ulcer Other reaction(s): Other (See Comments) Perforated ulcer  . Hydrocodone Other (See Comments)    headache  . Vancomycin Rash    Red man syndrome.  Please premedicate.    Social History:   Social History   Socioeconomic History  . Marital status: Married    Spouse name: [name]  . Number of children: 2  . Years of education: Associates  . Highest education level: Not on file  Occupational History  . Occupation: Administrator    Employer: [employer]  Social Needs  . Financial resource strain: Not on file  . Food insecurity:    Worry: Not on file    Inability: Not on file  . Transportation needs:    Medical: Not on file    Non-medical: Not on file  Tobacco Use  . Smoking status: Never Smoker  . Smokeless tobacco: Never Used  Substance and Sexual Activity  . Alcohol use: Yes    Comment: occas liquor   .  Drug use: No  . Sexual activity: Yes  Lifestyle  . Physical activity:    Days per week: Not on file    Minutes per session: Not on file  . Stress: Not on file  Relationships  . Social connections:    Talks on phone: Not on file    Gets together: Not on file    Attends religious service: Not on file    Active member of club or organization: Not on file    Attends meetings of clubs or organizations: Not on file    Relationship status: Not on file  . Intimate partner violence:    Fear of current or ex partner: Not on file    Emotionally abused: Not on file    Physically abused: Not on file    Forced sexual activity: Not on file  Other Topics Concern  . 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   Patient has two children.   Patient has a college education.   Regular exercise-no   Employed Event organiser and investigation   school travels       10 hours work     Sleep  No longer needs  cpap   Patient is right-handed.   Patient drinks one soda daily.   trainiing for triathalon        Family History:   Family History  Problem Relation Age of Onset  . Uterine cancer Mother   . Hypertension Mother   . Cervical cancer Mother   . Heart disease Father   . COPD Father   . Seizures Father   . Colon cancer Maternal Uncle   . Colon cancer Paternal Aunt     ROS:  Review of Systems  Constitutional: Positive for malaise/fatigue. Negative for chills, diaphoresis, fever and weight loss.  HENT: Negative for congestion.   Eyes: Negative for discharge and redness.  Respiratory: Positive for shortness of breath. Negative for cough, hemoptysis, sputum production and wheezing.   Cardiovascular: Positive for chest pain. Negative for palpitations, orthopnea, claudication, leg swelling and PND.  Gastrointestinal: Negative for abdominal pain, blood in stool, heartburn, melena, nausea and vomiting.  Genitourinary: Negative for  hematuria.  Musculoskeletal: Negative for falls and myalgias.  Skin: Negative for rash.  Neurological: Positive for weakness. Negative for dizziness, tingling, tremors, sensory change, speech change, focal weakness and loss of consciousness.  Endo/Heme/Allergies: Does not bruise/bleed easily.  Psychiatric/Behavioral: Negative for substance abuse. The patient is not nervous/anxious.   All other systems reviewed and are negative.     Physical Exam/Data:   Vitals:   09/26/18 2000 09/26/18 2041 09/26/18 2041 09/27/18 0333  BP: 126/82  (!) 151/64 121/80  Pulse: 68  78 (!) 59  Resp: 12  16 18   Temp:   98.6 F (37 C) 98 F (36.7 C)  TempSrc:   Oral   SpO2:   97% 97%  Weight:  85.8 kg    Height:  5\' 10"  (1.778 m)      Intake/Output Summary (Last 24 hours) at 09/27/2018 0706 Last data filed at 09/26/2018 2212 Gross per 24 hour  Intake 3 ml  Output 0 ml  Net 3 ml   Filed Weights   09/26/18 1508 09/26/18 2041  Weight: 82.1 kg 85.8 kg   Body mass index is 27.13 kg/m.   Physical Exam: General: Well developed, well nourished, in no acute distress. Head: Normocephalic, atraumatic, sclera non-icteric, no xanthomas,  nares without discharge.  Neck: Negative for carotid bruits. JVD not elevated. Lungs: Clear bilaterally to auscultation without wheezes, rales, or rhonchi. Breathing is unlabored. Heart: RRR with S1 S2. No murmurs, rubs, or gallops appreciated. Abdomen: Soft, non-tender, non-distended with normoactive bowel sounds. No hepatomegaly. No rebound/guarding. No obvious abdominal masses. Msk:  Strength and tone appear normal for age. Extremities: No clubbing or cyanosis. No edema. Distal pedal pulses are 2+ and equal bilaterally. Neuro: Alert and oriented X 3. No facial asymmetry. No focal deficit. Moves all extremities spontaneously. Psych:  Responds to questions appropriately with a normal affect.   EKG:  The EKG was personally reviewed and demonstrates: NSR, 74 bpm, left axis  deviation, inferolateral TWI Telemetry:  Telemetry was personally reviewed and demonstrates: Sinus bradycardia sinus rhythm, 50s to 90s bpm  Weights: Filed Weights   09/26/18 1508 09/26/18 2041  Weight: 82.1 kg 85.8 kg    Relevant CV Studies: Myoview 2007: No evidence of ischemia, EF 63%, normal study  Echo 2014: EF 60-65%, no RWMA, Gr1DD, mild AI, mildly dilated RA/RV, atrial septal aneurysm   Laboratory Data:  Chemistry Recent Labs  Lab 09/26/18 1548 09/27/18 0538  NA 142 142  K 3.4* 4.4  CL 109 109  CO2 22 28  GLUCOSE 88 94  BUN 9 11  CREATININE 1.23 1.28*  CALCIUM 8.8* 8.3*  GFRNONAA >60 >60  GFRAA >60 >60  ANIONGAP 11 5    No results for input(s): PROT, ALBUMIN, AST, ALT, ALKPHOS, BILITOT in the last 168 hours. Hematology Recent Labs  Lab 09/26/18 1548 09/27/18 0538  WBC 5.2 3.7*  RBC 3.78* 3.35*  HGB 13.4 12.1*  HCT 39.0 36.0*  MCV 103.2* 107.5*  MCH 35.4* 36.1*  MCHC 34.4 33.6  RDW 13.5 13.7  PLT 147* 126*   Cardiac Enzymes Recent Labs  Lab 09/26/18 1548 09/26/18 2203 09/27/18 0537  TROPONINI <0.03 <0.03 <0.03   No results for input(s): TROPIPOC in the last 168 hours.  BNPNo results for input(s): BNP, PROBNP in the last 168 hours.  DDimer No results for input(s): DDIMER in the last 168 hours.  Radiology/Studies:  Dg Chest 2 View  Result Date: 09/26/2018 IMPRESSION: Stable chest.  No acute cardiopulmonary process. Electronically Signed   By: Richardean Sale M.D.   On: 09/26/2018 16:00   Ct Angio Chest Pe W And/or Wo Contrast  Result Date: 09/26/2018 IMPRESSION: 1. Previously identified small volume pulmonary embolus in the right lung has decreased slightly in the interval. No new pulmonary embolic disease on today's exam. 2. Compressive atelectasis in the dependent lungs without pleural effusion. 3. Hepatic steatosis 4.  Aortic Atherosclerois (ICD10-170.0) Electronically Signed   By: Misty Stanley M.D.   On: 09/26/2018 17:31    Assessment  and Plan:   1. Chest pain: -Currently chest pain-free -Ruled out -Possibly in the setting of the patient's known PE -Lexiscan Myoview ordered per internal medicine, further recommendations pending this test -Check echocardiogram as below -Aspirin for now, however if Lexiscan is low risk would discontinue aspirin and resume on Eliquis  2. Recently diagnosed PE: -Continue Eliquis -Check echocardiogram  3. Ascending aortic aneurysm, measuring 4.1 cm: -Incidentally noted on CT chest in 06/2018 -Has been evaluated by CT surgery with continued monitoring being advised  -Optimal BP/HR/lipid control -Aneurysm precautions, to include possible genetic screening of children as an outpatient  4. HTN: -Blood pressure is well controlled currently -Continue lisinopril  5. HLD: -LDL of 75 from 02/2018 -Could consider starting Lipitor and outpatient  follow-up   For questions or updates, please contact [place] Please consult [email or web address] for contact info under Cardiology/STEMI.   Signed, Christell Faith, PA-C [facility] Pager: [phone] 09/27/2018, 7:06 AM

## 2018-09-27 NOTE — Plan of Care (Signed)
  Problem: Education: Goal: Understanding of cardiac disease, CV risk reduction, and recovery process will improve Outcome: Progressing Goal: Individualized Educational Video(s) Outcome: Progressing   Problem: Activity: Goal: Ability to tolerate increased activity will improve Outcome: Progressing   Problem: Cardiac: Goal: Ability to achieve and maintain adequate cardiovascular perfusion will improve Outcome: Progressing   Problem: Health Behavior/Discharge Planning: Goal: Ability to safely manage health-related needs after discharge will improve Outcome: Progressing   Problem: Health Behavior/Discharge Planning: Goal: Ability to manage health-related needs will improve Outcome: Progressing   Problem: Clinical Measurements: Goal: Ability to maintain clinical measurements within normal limits will improve Outcome: Progressing Goal: Diagnostic test results will improve Outcome: Progressing Goal: Cardiovascular complication will be avoided Outcome: Progressing

## 2018-09-27 NOTE — Plan of Care (Signed)
  Problem: Education: Goal: Understanding of cardiac disease, CV risk reduction, and recovery process will improve 09/27/2018 0106 by Guadlupe Spanish, RN Outcome: Progressing 09/27/2018 0105 by Guadlupe Spanish, RN Outcome: Not Progressing Goal: Individualized Educational Video(s) 09/27/2018 0106 by Guadlupe Spanish, RN Outcome: Progressing 09/27/2018 0105 by Guadlupe Spanish, RN Outcome: Not Progressing   Problem: Activity: Goal: Ability to tolerate increased activity will improve 09/27/2018 0106 by Guadlupe Spanish, RN Outcome: Progressing 09/27/2018 0105 by Guadlupe Spanish, RN Outcome: Not Progressing   Problem: Cardiac: Goal: Ability to achieve and maintain adequate cardiovascular perfusion will improve 09/27/2018 0106 by Guadlupe Spanish, RN Outcome: Progressing 09/27/2018 0105 by Guadlupe Spanish, RN Outcome: Not Progressing   Problem: Health Behavior/Discharge Planning: Goal: Ability to safely manage health-related needs after discharge will improve 09/27/2018 0106 by Guadlupe Spanish, RN Outcome: Progressing 09/27/2018 0105 by Guadlupe Spanish, RN Outcome: Not Progressing

## 2018-09-27 NOTE — Plan of Care (Signed)
  Problem: Education: Goal: Understanding of cardiac disease, CV risk reduction, and recovery process will improve Outcome: Not Progressing Goal: Individualized Educational Video(s) Outcome: Not Progressing   Problem: Activity: Goal: Ability to tolerate increased activity will improve Outcome: Not Progressing   Problem: Cardiac: Goal: Ability to achieve and maintain adequate cardiovascular perfusion will improve Outcome: Not Progressing   Problem: Health Behavior/Discharge Planning: Goal: Ability to safely manage health-related needs after discharge will improve Outcome: Not Progressing

## 2018-09-27 NOTE — Progress Notes (Signed)
Stress test is negative, discharge patient home and follow-up with [facility] in 2 to 3 weeks and reevaluate symptoms.  Patient also can make appointment tomorrow to follow with PCP in 1 week.

## 2018-09-28 ENCOUNTER — Inpatient Hospital Stay: Payer: Federal, State, Local not specified - PPO

## 2018-09-28 ENCOUNTER — Other Ambulatory Visit: Payer: Self-pay

## 2018-09-28 ENCOUNTER — Inpatient Hospital Stay: Payer: Federal, State, Local not specified - PPO | Attending: Oncology | Admitting: Oncology

## 2018-09-28 DIAGNOSIS — Z7901 Long term (current) use of anticoagulants: Secondary | ICD-10-CM | POA: Insufficient documentation

## 2018-09-28 DIAGNOSIS — I2699 Other pulmonary embolism without acute cor pulmonale: Secondary | ICD-10-CM | POA: Insufficient documentation

## 2018-09-28 DIAGNOSIS — Z86711 Personal history of pulmonary embolism: Secondary | ICD-10-CM | POA: Insufficient documentation

## 2018-09-28 DIAGNOSIS — I10 Essential (primary) hypertension: Secondary | ICD-10-CM | POA: Diagnosis not present

## 2018-09-28 DIAGNOSIS — Z79899 Other long term (current) drug therapy: Secondary | ICD-10-CM | POA: Diagnosis not present

## 2018-09-28 DIAGNOSIS — E538 Deficiency of other specified B group vitamins: Secondary | ICD-10-CM | POA: Insufficient documentation

## 2018-09-28 DIAGNOSIS — E119 Type 2 diabetes mellitus without complications: Secondary | ICD-10-CM | POA: Diagnosis not present

## 2018-09-28 DIAGNOSIS — E669 Obesity, unspecified: Secondary | ICD-10-CM

## 2018-09-28 LAB — ANTITHROMBIN III: AntiThromb III Func: 87 % (ref 75–120)

## 2018-09-28 NOTE — Progress Notes (Signed)
Patient is here today to establish care. Patient stated that he continues to have SOB on exertion or even sitting down. Patient also stated that he continues to have a dry cough.

## 2018-09-29 LAB — CARDIOLIPIN ANTIBODIES, IGG, IGM, IGA
Anticardiolipin IgA: <9 APL U/mL (ref 0–11)
Anticardiolipin IgG: <9 GPL U/mL (ref 0–14)
Anticardiolipin IgM: <9 MPL U/mL (ref 0–12)

## 2018-09-29 LAB — PROTEIN C, TOTAL: PROTEIN C, TOTAL: 81 % (ref 60–150)

## 2018-09-29 LAB — PROTEIN S, TOTAL: Protein S Ag, Total: 91 % (ref 60–150)

## 2018-09-29 LAB — PROTEIN C ACTIVITY: Protein C Activity: 96 % (ref 73–180)

## 2018-09-29 LAB — PROTEIN S ACTIVITY: Protein S Activity: 172 % — ABNORMAL HIGH (ref 63–140)

## 2018-09-30 LAB — LUPUS ANTICOAGULANT PANEL
DRVVT: 52.1 s — ABNORMAL HIGH (ref 0.0–47.0)
PTT Lupus Anticoagulant: 29.5 s (ref 0.0–51.9)

## 2018-09-30 LAB — DRVVT MIX: dRVVT Mix: 45.8 s (ref 0.0–47.0)

## 2018-10-04 LAB — FACTOR 5 LEIDEN

## 2018-10-04 LAB — PROTHROMBIN GENE MUTATION

## 2018-10-12 ENCOUNTER — Encounter: Payer: Self-pay | Admitting: Nurse Practitioner

## 2018-10-12 ENCOUNTER — Ambulatory Visit: Payer: Federal, State, Local not specified - PPO | Admitting: Nurse Practitioner

## 2018-10-12 VITALS — BP 144/84 | HR 81 | Ht 70.0 in | Wt 201.0 lb

## 2018-10-12 DIAGNOSIS — R0789 Other chest pain: Secondary | ICD-10-CM | POA: Diagnosis not present

## 2018-10-12 DIAGNOSIS — I10 Essential (primary) hypertension: Secondary | ICD-10-CM

## 2018-10-12 DIAGNOSIS — I7121 Aneurysm of the ascending aorta, without rupture: Secondary | ICD-10-CM

## 2018-10-12 DIAGNOSIS — I2699 Other pulmonary embolism without acute cor pulmonale: Secondary | ICD-10-CM

## 2018-10-12 DIAGNOSIS — I712 Thoracic aortic aneurysm, without rupture: Secondary | ICD-10-CM

## 2018-10-12 NOTE — Patient Instructions (Signed)
Medication Instructions:  Your physician recommends that you continue on your current medications as directed. Please refer to the Current Medication list given to you today.  If you need a refill on your cardiac medications before your next appointment, please call your pharmacy.   Lab work: None ordered  If you have labs (blood work) drawn today and your tests are completely normal, you will receive your results only by: [name] MyChart Message (if you have MyChart) OR . A paper copy in the mail If you have any lab test that is abnormal or we need to change your treatment, we will call you to review the results.  Testing/Procedures: None ordered   Follow-Up: At [facility], you and your health needs are our priority.  As part of our continuing mission to provide you with exceptional heart care, we have created designated Provider Care Teams.  These Care Teams include your primary Cardiologist (physician) and Advanced Practice Providers (APPs -  Physician Assistants and Nurse Practitioners) who all work together to provide you with the care you need, when you need it. You will need a follow up appointment in 4-6 months.  Please call our office 2 months in advance to schedule this appointment.  You may see Kathlyn Sacramento, MD or one of the following Advanced Practice Providers on your designated Care Team:   Murray Hodgkins, NP Christell Faith, PA-C . Marrianne Mood, PA-C

## 2018-10-12 NOTE — Progress Notes (Signed)
Office Visit    Patient Name: Hector [name] Date of Encounter: 10/12/2018  Primary Care Provider:  Burnis Medin, MD Primary Cardiologist:  Kathlyn Sacramento, MD  Chief Complaint    58 year old male with a history of hypertension, hyperlipidemia, diabetes, obesity status post gastric bypass, obstructive sleep apnea, and recent diagnosis of pulmonary embolism, who presents for follow-up after recent admission for chest pain and dyspnea with negative stress testing.  Past Medical History    Past Medical History:  Diagnosis Date  . Abnormal LFTs    improved after gastic bypass  . ADJ DISORDER WITH MIXED ANXIETY & DEPRESSED MOOD 08/23/2009   Qualifier: Diagnosis of  By: [name] MD, Standley Brooking Now on lamictal and lexapro and elevil for sleep.   [name] Anxiety   . Ascending aortic aneurysm ([place])    a. 06/2018 CT: 4.1cm.  . B12 nutritional deficiency   . Chest pain    a. 2007 MV: No ischemia; b. 09/2018 recurrent c/p in setting of PE; c. 09/2018 MV: No ischemia/infarct. EF 55-65%.  . Closed head injury    laceration  9 12  no loc  vision change  . DDD (degenerative disc disease)   . Diabetes mellitus    resolved with gastric bypass  . GERD (gastroesophageal reflux disease)    no meds  . Gout   . Headache    once per week uses Indomethicin hx of cluster headaches   . History of bunionectomy of left great toe   . History of concussion   . History of echocardiogram    a. 09/2018 Echo: Ef 65-70%, no rwma, mild MR. Ao root 74mm (mildly dil).  . History of renal stone    Seen in emergency room not urologist  . Hyperlipidemia   . Hypertension 02/27/2017  . OBESITY 12/10/2007   a. s/p gastric bypass.  . OSA (obstructive sleep apnea) 12/12/2013  . Pancreatitis 03/21/2016   went to [place]  . Perforated ulcer ([place])    Surgical repair9/24 2011  . Pulmonary embolism ([place])    a. 09/18/2018 CTA Chest: RLL segmental arterial branch small volume PE-->Eliquis.   Past Surgical History:  Procedure  Laterality Date  . BACK SURGERY  2012   Dr. Dorina Hoyer disk  . CARPAL TUNNEL RELEASE  2008   left-ulnar nerve decomp  . carpel tunnel Left   . CHOLECYSTECTOMY N/A 03/21/2016   Procedure: LAPAROSCOPIC CHOLECYSTECTOMY WITH INTRAOPERATIVE CHOLANGIOGRAM;  Surgeon: Ralene Ok, MD;  Location: [facility];  Service: General;  Laterality: N/A;  . colonscopy     . EPIDURAL BLOCK INJECTION     by Dr. Nelva Bush  . FOOT SURGERY     bilat for removal of extra bone   . GASTRIC BYPASS  11/06/09   beginning weight 267  . HARDWARE REMOVAL N/A 05/02/2016   Procedure: Removal of hardware Lumbar five-Sacral one with Metrex;  Surgeon: Kristeen Miss, MD;  Location: [facility];  Service: Neurosurgery;  Laterality: N/A;  Removal of hardware L5-S1 with Metrex  . HERNIA REPAIR  [id number]   umbilical  . HIATAL HERNIA REPAIR N/A 01/20/2013   Procedure: LAPAROSCOPIC REPAIR OF INTERNAL HERNIA;  Surgeon: Shann Medal, MD;  Location: [facility];  Service: General;  Laterality: N/A;  . LUMBAR LAMINECTOMY/DECOMPRESSION MICRODISCECTOMY Left 06/07/2014   Procedure: MICRO LUMBAR DECOMPRESSION L5 - S1 ON THE LEFT/L5 FORAMINOTOMY/EXCISION SYNOVIAL CYST  1 LEVEL;  Surgeon: [name], MD;  Location: [facility];  Service: Orthopedics;  Laterality: Left;  .  MASS EXCISION Right 04/18/2013   Procedure: RIGHT SMALL FINGER SKIN LESION EXCISION;  Surgeon: Jolyn Nap, MD;  Location: [facility];  Service: Orthopedics;  Laterality: Right;  . NASAL SEPTOPLASTY W/ TURBINOPLASTY     x 2  . NEPHROLITHOTOMY Right 11/03/2014   Procedure: RIGHT PERCUTANEOUS NEPHROLITHOTOMY;  Surgeon: Claybon Jabs, MD;  Location: [facility];  Service: Urology;  Laterality: Right;  . SHOULDER ARTHROSCOPY  2014   rt  . surgerical repair of stomach ulcer  06/08/10   per  . TONSILLECTOMY    . UVULOPALATOPHARYNGOPLASTY  2007   with tonsils  . VASECTOMY      Allergies  Allergies  Allergen Reactions  . Aspirin Other (See Comments)    perforated ulcer    . Cymbalta [Duloxetine Hcl] Other (See Comments)    Over-sedating; slept over 24 hours with one dose.  Possible serotonin syndrome.  . Tape Rash    Surgical tape  . Nsaids Other (See Comments)    ulcer Other reaction(s): Other (See Comments) Perforated ulcer  . Hydrocodone Other (See Comments)    headache  . Vancomycin Rash    Red man syndrome.  Please premedicate.    History of Present Illness    58 year old male with the above complex past medical history including hypertension, hyperlipidemia, diabetes, obesity status post gastric bypass, and obstructive sleep apnea.  He was previous evaluated from a cardiac standpoint 2007 with stress testing, which was nonischemic.  Echocardiogram in 2014 showed normal LV function.  In October 2019, he suffered a fall with resultant rib fracture and chest pain.  A CT of the chest was performed at that time which showed an incidental 4.1 cm ascending aortic aneurysm.  In December 2019, he was treated for dyspnea and cough with 2 courses of antibiotics in the setting of a presumption of pneumonia.  Due to worsening dyspnea on January 4, he presented to the ED and he was found to have a small volume right lower lobe pulmonary embolism.  He was placed on Eliquis therapy with a plan for outpatient hematology work-up.  Following diagnosis of pulmonary embolism, he continued to note intermittent dyspnea and diffuse anterior wall chest tightness.  Discomfort worsened on September 26, 2018, and he presented [facility] ED.  Repeat CTA showed previously identified small volume pulmonary embolus in the right lung, which is decreased slightly in the interval since diagnosis.  Aortic atherosclerosis was noted.  Patient was admitted and ruled out.  He was seen by our team and underwent stress testing which was low risk.  Echocardiogram showed normal LV function with stable 4.1 cm dilated aortic root.  He was subsequently discharged.  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  Since his discharge, he has not had any recurrent chest pain or tightness.  He notes mild dyspnea but overall, this has been improving.  He denies palpitations, PND, orthopnea, dizziness, syncope, edema, or early satiety.  He has followed up with hematology and will remain on Eliquis for at least 3 months with plan for follow-up labs after that.  Home Medications    Prior to Admission medications   Medication Sig Start Date End Date Taking? Authorizing Provider  albuterol (PROVENTIL HFA;VENTOLIN HFA) 108 (90 Base) MCG/ACT inhaler Inhale 2 puffs into the lungs every 6 (six) hours as needed for wheezing or shortness of breath. 09/03/18   Caren Macadam, MD  apixaban (ELIQUIS) 5 MG TABS tablet Take 2 tablets (10  mg) PO BID x 7 days, then reduce dose to 1 tablet (5 mg) PO BID. Patient taking differently: Take 5 mg by mouth 2 (two) times daily.  09/19/18   Hinda Kehr, MD  Cholecalciferol (VITAMIN D-1000 MAX ST) 1000 units tablet Take 1,000 Units by mouth daily.    [provider]  escitalopram (LEXAPRO) 20 MG tablet Take 20 mg by mouth every morning.     [provider]  lamoTRIgine (LAMICTAL) 100 MG tablet Take 100 mg by mouth 2 (two) times daily.     [provider]  lisinopril (PRINIVIL,ZESTRIL) 10 MG tablet Take 1 tablet (10 mg total) by mouth daily. May increase to 20mg  if needed. Send BP readings in 1 month. 12/29/17   Panosh, Standley Brooking, MD  Multiple Vitamin (MULTIVITAMIN) tablet Take 1 tablet by mouth daily.    [provider]  nitroGLYCERIN (NITROSTAT) 0.3 MG SL tablet Place 1 tablet (0.3 mg total) under the tongue every 5 (five) minutes as needed for chest pain. Patient not taking: Reported on 09/28/2018 09/27/18 09/27/19  Epifanio Lesches, MD  Spacer/Aero-Holding Chambers (E-Z SPACER) inhaler Use as instructed 09/03/18   Caren Macadam, MD  tamsulosin (FLOMAX) 0.4 MG CAPS capsule Take 1 capsule (0.4 mg total) by mouth daily. Patient not  taking: Reported on 09/28/2018 10/29/17   Abbie Sons, MD  traZODone (DESYREL) 100 MG tablet Take 300 mg by mouth at bedtime.     [provider]  vitamin B-12 (CYANOCOBALAMIN) 1000 MCG tablet Take 1,000 mcg by mouth daily.    [provider]    Review of Systems    Doing better since his hospital discharge.  No chest pain or tightness.  Still some dyspnea on exertion though overall improving since December/early January.  He denies palpitations, PND, orthopnea, dizziness, syncope, edema, or early satiety.  All other systems reviewed and are otherwise negative except as noted above.  Physical Exam    VS:  BP (!) 144/84 (BP Location: Left Arm, Patient Position: Sitting, Cuff Size: Normal)   Pulse 81   Ht 5\' 10"  (1.778 m)   Wt 201 lb (91.2 kg)   BMI 28.84 kg/m  , BMI Body mass index is 28.84 kg/m. GEN: Well nourished, well developed, in no acute distress. HEENT: normal. Neck: Supple, no JVD, carotid bruits, or masses. Cardiac: RRR, no murmurs, rubs, or gallops. No clubbing, cyanosis, trace bilateral ankle edema above his sock line.  Radials/PT 2+ and equal bilaterally.  Respiratory:  Respirations regular and unlabored, clear to auscultation bilaterally. GI: Soft, nontender, nondistended, BS + x 4. MS: no deformity or atrophy. Skin: warm and dry, no rash. Neuro:  Strength and sensation are intact. Psych: Normal affect.  Accessory Clinical Findings    ECG personally reviewed by me today -regular sinus rhythm, 81, left axis deviation- no acute changes.  Assessment & Plan    1.  Right lower lobe pulmonary embolism: Diagnosed in early January.  He had been having chest tightness associated with dyspnea prior to that diagnosis and was initially treated for pneumonia.  Since diagnosis of PE, he has had recurrent chest tightness and dyspnea resulting in hospitalization in mid January with nonischemic stress testing and normal LV function by echo.  Since his discharge, he  is continue to have mild dyspnea on exertion which is overall improved but has not had any chest tightness or pain.  He remains on Eliquis therapy and is now followed by hematology.  2.  Chest pain/tightness: Resolved  since hospitalization.  Normal troponins and negative Myoview during hospitalization.  Echo showed normal LV function.  No further ischemic evaluation warranted at this time.  He understands that if he has return of symptoms, we would consider diagnostic catheterization.  3.  Essential hypertension: Blood pressure elevated today at 144/84.  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  He says he checks his blood pressure regularly at work and is typically in the 120s.  He is currently on lisinopril 10 mg daily.  I advised that he should continue to follow his blood pressure and if he notes systolics trending greater than 130, to notify us, at which point we would increase his lisinopril to 20 mg daily and for plan for a follow-up basic metabolic panel 1 week later.  He agreed to this plan.  4.  Ascending aortic aneurysm: 4.1 cm by CT earlier this month.  5.  Disposition: Follow-up with Dr. Fletcher Anon in 4 to 6 months.  Patient will notify us if he has any return of chest pain or elevated blood pressures.  Murray Hodgkins, NP 10/12/2018, 9:36 AM

## 2018-10-14 ENCOUNTER — Encounter: Payer: Self-pay | Admitting: Podiatry

## 2018-10-14 ENCOUNTER — Other Ambulatory Visit: Payer: Self-pay | Admitting: Podiatry

## 2018-10-14 ENCOUNTER — Ambulatory Visit: Payer: Federal, State, Local not specified - PPO | Admitting: Podiatry

## 2018-10-14 ENCOUNTER — Ambulatory Visit (INDEPENDENT_AMBULATORY_CARE_PROVIDER_SITE_OTHER): Payer: Federal, State, Local not specified - PPO

## 2018-10-14 DIAGNOSIS — M778 Other enthesopathies, not elsewhere classified: Secondary | ICD-10-CM

## 2018-10-14 DIAGNOSIS — M1000 Idiopathic gout, unspecified site: Secondary | ICD-10-CM

## 2018-10-14 DIAGNOSIS — M779 Enthesopathy, unspecified: Secondary | ICD-10-CM

## 2018-10-14 DIAGNOSIS — M79672 Pain in left foot: Secondary | ICD-10-CM

## 2018-10-14 MED ORDER — TRIAMCINOLONE ACETONIDE 10 MG/ML IJ SUSP
10.0000 mg | Freq: Once | INTRAMUSCULAR | Status: AC
  Administered 2018-10-14: 10 mg

## 2018-10-14 MED ORDER — INDOMETHACIN 25 MG PO CAPS: 50.0000 mg | ORAL_CAPSULE | Freq: Three times a day (TID) | ORAL | Status: DC | PRN

## 2018-10-15 NOTE — Progress Notes (Signed)
Subjective:   Patient ID: Hector [name], male   DOB: 58 y.o.   MRN: [id number]   HPI Patient presents stating he is developed a lot of pain around his big toe left foot and it is been very sore and making it hard to walk.  Patient had this last year in a different spot and has had several attacks but this is the first 1 in the last 9 months   ROS      Objective:  Physical Exam  Neurovascular status intact with patient's left first MPJ being quite inflamed and sore with mild range of motion loss.  Was questioned extensively does not appear to have had any other attacks over the last 9 months     Assessment:  What appears to be acute gout attack with inflammatory capsulitis first MPJ left     Plan:  Educated him on gout and I did write prescription for indomethacin as is been effective when he feels like intact coming.  Today I did a sterile prep of the left first MPJ injected the medial dorsal capsule 3 mg Kenalog 5 mg Xylocaine to reduce symptoms and if symptoms persist we will need to consider getting him on medication for this and at this point were not can get blood work but may need to get if he gets another attack

## 2018-10-19 ENCOUNTER — Telehealth: Payer: Self-pay | Admitting: Podiatry

## 2018-10-19 MED ORDER — INDOMETHACIN 50 MG PO CAPS: 50.0000 mg | ORAL_CAPSULE | Freq: Two times a day (BID) | ORAL | 1 refills | Status: DC

## 2018-10-19 NOTE — Telephone Encounter (Signed)
I saw Dr. Paulla Dolly last Thursday for gout and he was supposed to send indomethacin to my pharmacy on [address] in [place]. They do not have it, can you send it again please.

## 2018-10-19 NOTE — Addendum Note (Signed)
Addended by: Harriett Sine D on: 10/19/2018 02:58 PM   Modules accepted: Orders

## 2018-10-25 DIAGNOSIS — M25562 Pain in left knee: Secondary | ICD-10-CM | POA: Diagnosis not present

## 2018-10-25 DIAGNOSIS — M25462 Effusion, left knee: Secondary | ICD-10-CM | POA: Diagnosis not present

## 2018-10-25 DIAGNOSIS — M1712 Unilateral primary osteoarthritis, left knee: Secondary | ICD-10-CM | POA: Diagnosis not present

## 2018-10-26 ENCOUNTER — Ambulatory Visit (INDEPENDENT_AMBULATORY_CARE_PROVIDER_SITE_OTHER): Payer: Federal, State, Local not specified - PPO | Admitting: Orthopaedic Surgery

## 2018-10-26 DIAGNOSIS — I10 Essential (primary) hypertension: Secondary | ICD-10-CM | POA: Diagnosis not present

## 2018-10-26 DIAGNOSIS — G4733 Obstructive sleep apnea (adult) (pediatric): Secondary | ICD-10-CM | POA: Diagnosis not present

## 2018-10-26 DIAGNOSIS — E119 Type 2 diabetes mellitus without complications: Secondary | ICD-10-CM | POA: Diagnosis not present

## 2018-10-26 DIAGNOSIS — G44019 Episodic cluster headache, not intractable: Secondary | ICD-10-CM | POA: Diagnosis not present

## 2018-10-28 DIAGNOSIS — M25562 Pain in left knee: Secondary | ICD-10-CM | POA: Diagnosis not present

## 2018-10-29 ENCOUNTER — Ambulatory Visit (INDEPENDENT_AMBULATORY_CARE_PROVIDER_SITE_OTHER): Payer: Federal, State, Local not specified - PPO | Admitting: Orthopaedic Surgery

## 2018-11-02 DIAGNOSIS — M25562 Pain in left knee: Secondary | ICD-10-CM | POA: Diagnosis not present

## 2018-11-02 DIAGNOSIS — M179 Osteoarthritis of knee, unspecified: Secondary | ICD-10-CM | POA: Insufficient documentation

## 2018-11-02 DIAGNOSIS — M1710 Unilateral primary osteoarthritis, unspecified knee: Secondary | ICD-10-CM | POA: Insufficient documentation

## 2018-11-02 DIAGNOSIS — M1712 Unilateral primary osteoarthritis, left knee: Secondary | ICD-10-CM | POA: Diagnosis not present

## 2018-11-09 DIAGNOSIS — F4312 Post-traumatic stress disorder, chronic: Secondary | ICD-10-CM | POA: Diagnosis not present

## 2018-11-17 DIAGNOSIS — F3132 Bipolar disorder, current episode depressed, moderate: Secondary | ICD-10-CM | POA: Diagnosis not present

## 2018-11-17 DIAGNOSIS — F4310 Post-traumatic stress disorder, unspecified: Secondary | ICD-10-CM | POA: Diagnosis not present

## 2018-11-22 ENCOUNTER — Telehealth: Payer: Self-pay | Admitting: *Deleted

## 2018-11-22 NOTE — Telephone Encounter (Signed)
Would be unusual to have worsening PE while on Eliquis unless he's missed some doses.  OK to move up appt, but patient should be evaluated by his PCP or urgent care.

## 2018-11-22 NOTE — Telephone Encounter (Signed)
I spoke with Janine who reports he did see his PCP 2 weeks ago and that the shortness of breath comes and goes. She wants an appointment to see Finnegan,ibuprofen told her that Jerene Pitch will call her with appointment.

## 2018-11-22 NOTE — Telephone Encounter (Signed)
I attempted to contact patients wife to schedule appt. She did nlt answer. I left a VM to call me back to get him scheduled.

## 2018-11-22 NOTE — Telephone Encounter (Signed)
Wife called reporting that patient is on Eliquis for a clot and that he is having shortness of breath. She requests in come in the see physician before April appointment to be evaluated. 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 Please call her to schedule appointment [phone]

## 2018-11-23 NOTE — Telephone Encounter (Signed)
2nd attempt to contact patient was made today. Left VM to call office back to move appt up.

## 2018-12-30 NOTE — Progress Notes (Deleted)
[place]  Telephone:(336) [phone] Fax:(336) [phone]  ID: Hector [name] OB: July 07, 1961  MR#: [id number]  KVQ#:[id number]  Patient Care Team: Burnis Medin, MD as PCP - General Wellington Hampshire, MD as PCP - Cardiology (Cardiology) Susa Day, MD (Orthopedic Surgery) Annia Belt, MD as Consulting Physician (Hematology and Oncology) Milagros Evener, NP as Nurse Practitioner Rana Snare, MD as Consulting Physician (Urology)  I connected with Hector [name] on 12/30/18 at  9:45 AM EDT by {Blank single:19197::"video enabled telemedicine visit","telephone visit"} and verified that I am speaking with the correct person using two identifiers.   I discussed the limitations, risks, security and privacy concerns of performing an evaluation and management service by telemedicine and the availability of in-person appointments. I also discussed with the patient that there may be a patient responsible charge related to this service. The patient expressed understanding and agreed to proceed.   Other persons participating in the visit and their role in the encounter: ***   Patients location: ***  Providers location: ***   CHIEF COMPLAINT: Pulmonary embolism  INTERVAL HISTORY: Patient is an active 58 year old male who recently presented to the emergency room with increasing shortness of breath.  Subsequent CT scan revealed right-sided pulmonary embolism.  He returned to the emergency room 1 week later with continued shortness of breath and chest pain.  Pulmonary embolism was smaller, patient was noted to have T wave inversion.  Currently, he feels well and is asymptomatic.  He denies any further chest pain or shortness of breath.  He has no neurologic complaints.  He denies any recent fevers or illnesses.  He reports broken ribs in October 2019, but no other trauma.  He denies any nausea, vomiting, constipation, or diarrhea.  He has good appetite and denies weight loss.   He has no urinary complaints.  Patient offers no further specific complaints today.  REVIEW OF SYSTEMS:   Review of Systems  Constitutional: Negative.  Negative for fever, malaise/fatigue and weight loss.  Respiratory: Negative.  Negative for cough, hemoptysis and shortness of breath.   Cardiovascular: Negative.  Negative for chest pain and leg swelling.  Gastrointestinal: Negative.  Negative for abdominal pain.  Genitourinary: Negative.  Negative for hematuria.  Musculoskeletal: Negative.  Negative for back pain.  Skin: Negative.  Negative for rash.  Neurological: Negative.  Negative for focal weakness, weakness and headaches.  Psychiatric/Behavioral: Negative.  The patient is not nervous/anxious.     As per HPI. Otherwise, a complete review of systems is negative.  PAST MEDICAL HISTORY: Past Medical History:  Diagnosis Date   Abnormal LFTs    improved after gastic bypass   ADJ DISORDER WITH MIXED ANXIETY & DEPRESSED MOOD 08/23/2009   Qualifier: Diagnosis of  By: [name] MD, Standley Brooking Now on lamictal and lexapro and elevil for sleep.    Anxiety    Ascending aortic aneurysm ([place])    a. 06/2018 CT: 4.1cm.   B12 nutritional deficiency    Chest pain    a. 2007 MV: No ischemia; b. 09/2018 recurrent c/p in setting of PE; c. 09/2018 MV: No ischemia/infarct. EF 55-65%.   Closed head injury    laceration  9 12  no loc  vision change   DDD (degenerative disc disease)    Diabetes mellitus    resolved with gastric bypass   GERD (gastroesophageal reflux disease)    no meds   Gout    Headache    once per week uses Indomethicin hx of  cluster headaches    History of bunionectomy of left great toe    History of concussion    History of echocardiogram    a. 09/2018 Echo: Ef 65-70%, no rwma, mild MR. Ao root 60mm (mildly dil).   History of renal stone    Seen in emergency room not urologist   Hyperlipidemia    Hypertension 02/27/2017   OBESITY 12/10/2007   a. s/p gastric  bypass.   OSA (obstructive sleep apnea) 12/12/2013   Pancreatitis 03/21/2016   went to [place]   Perforated ulcer [facility])    Surgical repair9/24 2011   Pulmonary embolism ([place])    a. 09/18/2018 CTA Chest: RLL segmental arterial branch small volume PE-->Eliquis.    PAST SURGICAL HISTORY: Past Surgical History:  Procedure Laterality Date   BACK SURGERY  2012   Dr. Dorina Hoyer disk   CARPAL TUNNEL RELEASE  2008   left-ulnar nerve decomp   carpel tunnel Left    CHOLECYSTECTOMY N/A 03/21/2016   Procedure: LAPAROSCOPIC CHOLECYSTECTOMY WITH INTRAOPERATIVE CHOLANGIOGRAM;  Surgeon: Ralene Ok, MD;  Location: [facility];  Service: General;  Laterality: N/A;   colonscopy      EPIDURAL BLOCK INJECTION     by Dr. Nelva Bush   FOOT SURGERY     bilat for removal of extra bone    GASTRIC BYPASS  11/06/09   beginning weight 267   HARDWARE REMOVAL N/A 05/02/2016   Procedure: Removal of hardware Lumbar five-Sacral one with Metrex;  Surgeon: Kristeen Miss, MD;  Location: [facility];  Service: Neurosurgery;  Laterality: N/A;  Removal of hardware L5-S1 with Metrex   HERNIA REPAIR  3762   umbilical   HIATAL HERNIA REPAIR N/A 01/20/2013   Procedure: LAPAROSCOPIC REPAIR OF INTERNAL HERNIA;  Surgeon: Shann Medal, MD;  Location: [facility];  Service: General;  Laterality: N/A;   LUMBAR LAMINECTOMY/DECOMPRESSION MICRODISCECTOMY Left 06/07/2014   Procedure: MICRO LUMBAR DECOMPRESSION L5 - S1 ON THE LEFT/L5 FORAMINOTOMY/EXCISION SYNOVIAL CYST  1 LEVEL;  Surgeon: [name], MD;  Location: [facility];  Service: Orthopedics;  Laterality: Left;   MASS EXCISION Right 04/18/2013   Procedure: RIGHT SMALL FINGER SKIN LESION EXCISION;  Surgeon: Jolyn Nap, MD;  Location: [place];  Service: Orthopedics;  Laterality: Right;   NASAL SEPTOPLASTY W/ TURBINOPLASTY     x 2   NEPHROLITHOTOMY Right 11/03/2014   Procedure: RIGHT PERCUTANEOUS NEPHROLITHOTOMY;  Surgeon: Claybon Jabs, MD;   Location: [facility];  Service: Urology;  Laterality: Right;   SHOULDER ARTHROSCOPY  2014   rt   surgerical repair of stomach ulcer  06/08/10   per   TONSILLECTOMY     UVULOPALATOPHARYNGOPLASTY  2007   with tonsils   VASECTOMY      FAMILY HISTORY: Family History  Problem Relation Age of Onset   Uterine cancer Mother    Hypertension Mother    Cervical cancer Mother    Heart disease Father    COPD Father    Seizures Father    Colon cancer Maternal Uncle    Colon cancer Paternal Aunt     ADVANCED DIRECTIVES (Y/N):  N  HEALTH MAINTENANCE: Social History   Tobacco Use   Smoking status: Never Smoker   Smokeless tobacco: Never Used  Substance Use Topics   Alcohol use: Yes    Comment: occas liquor    Drug use: No     Colonoscopy:  PAP:  Bone density:  Lipid panel:  Allergies  Allergen Reactions  Aspirin Other (See Comments)    perforated ulcer   Cymbalta [Duloxetine Hcl] Other (See Comments)    Over-sedating; slept over 24 hours with one dose.  Possible serotonin syndrome.   Tape Rash    Surgical tape   Nsaids Other (See Comments)    ulcer Other reaction(s): Other (See Comments) Perforated ulcer   Hydrocodone Other (See Comments)    headache   Vancomycin Rash    Red man syndrome.  Please premedicate.    Current Outpatient Medications  Medication Sig Dispense Refill   albuterol (PROVENTIL HFA;VENTOLIN HFA) 108 (90 Base) MCG/ACT inhaler Inhale 2 puffs into the lungs every 6 (six) hours as needed for wheezing or shortness of breath. 1 Inhaler 0   apixaban (ELIQUIS) 5 MG TABS tablet Take 2 tablets (10 mg) PO BID x 7 days, then reduce dose to 1 tablet (5 mg) PO BID. (Patient taking differently: Take 5 mg by mouth 2 (two) times daily. ) 60 tablet 2   Cholecalciferol (VITAMIN D-1000 MAX ST) 1000 units tablet Take 1,000 Units by mouth daily.     escitalopram (LEXAPRO) 20 MG tablet Take 20 mg by mouth every morning.      indomethacin  (INDOCIN) 50 MG capsule Take 1 capsule (50 mg total) by mouth 2 (two) times daily with a meal. 60 capsule 1   lamoTRIgine (LAMICTAL) 100 MG tablet Take 100 mg by mouth 2 (two) times daily.      lisinopril (PRINIVIL,ZESTRIL) 10 MG tablet Take 1 tablet (10 mg total) by mouth daily. May increase to 20mg  if needed. Send BP readings in 1 month. 90 tablet 0   Multiple Vitamin (MULTIVITAMIN) tablet Take 1 tablet by mouth daily.     nitroGLYCERIN (NITROSTAT) 0.3 MG SL tablet Place 1 tablet (0.3 mg total) under the tongue every 5 (five) minutes as needed for chest pain. 100 tablet 3   Spacer/Aero-Holding Chambers (E-Z SPACER) inhaler Use as instructed 1 each 2   tamsulosin (FLOMAX) 0.4 MG CAPS capsule Take 1 capsule (0.4 mg total) by mouth daily. 30 capsule 1   traZODone (DESYREL) 100 MG tablet Take 300 mg by mouth at bedtime.      vitamin B-12 (CYANOCOBALAMIN) 1000 MCG tablet Take 1,000 mcg by mouth daily.     Current Facility-Administered Medications  Medication Dose Route Frequency Provider Last Rate Last Dose   indomethacin (INDOCIN) capsule 50 mg  50 mg Oral TID PRN Wallene Huh, DPM        OBJECTIVE: There were no vitals filed for this visit.   There is no height or weight on file to calculate BMI.    ECOG FS:0 - Asymptomatic  General: Well-developed, well-nourished, no acute distress. Eyes: Pink conjunctiva, anicteric sclera. HEENT: Normocephalic, moist mucous membranes, clear oropharnyx. Lungs: Clear to auscultation bilaterally. Heart: Regular rate and rhythm. No rubs, murmurs, or gallops. Abdomen: Soft, nontender, nondistended. No organomegaly noted, normoactive bowel sounds. Musculoskeletal: No edema, cyanosis, or clubbing. Neuro: Alert, answering all questions appropriately. Cranial nerves grossly intact. Skin: No rashes or petechiae noted. Psych: Normal affect. Lymphatics: No cervical, calvicular, axillary or inguinal LAD.   LAB RESULTS:  Lab Results  Component Value  Date   NA 142 09/27/2018   K 4.4 09/27/2018   CL 109 09/27/2018   CO2 28 09/27/2018   GLUCOSE 94 09/27/2018   BUN 11 09/27/2018   CREATININE 1.28 (H) 09/27/2018   CALCIUM 8.3 (L) 09/27/2018   PROT 6.6 07/10/2018   ALBUMIN 3.9 07/10/2018   AST  40 07/10/2018   ALT 22 07/10/2018   ALKPHOS 76 07/10/2018   BILITOT 0.6 07/10/2018   GFRNONAA >60 09/27/2018   GFRAA >60 09/27/2018    Lab Results  Component Value Date   WBC 3.7 (L) 09/27/2018   NEUTROABS 4.1 07/10/2018   HGB 12.1 (L) 09/27/2018   HCT 36.0 (L) 09/27/2018   MCV 107.5 (H) 09/27/2018   PLT 126 (L) 09/27/2018     STUDIES: No results found.  ASSESSMENT: Pulmonary embolism  PLAN:    1. Pulmonary embolism: CT scan results from September 19, 2017 reviewed independently and reported as above with improvement of right lower lobe segmental pulmonary embolism after 1 week of treatment.  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  He has no obvious transient risk factors.  He broke 3 left sided ribs in October 2019, that may have contributed.  He has no personal or family history of DVT or blood clot.  Full hypercoagulable work-up was initiated today.  Patient will require a minimum of 3 months of Eliquis.  Return to clinic in 3 months to discuss his laboratory work and whether or not to continue Eliquis.  I provided *** minutes of {Blank single:19197::"face-to-face video visit time","non face-to-face telephone visit time"} during this encounter, and > 50% was spent counseling as documented under my assessment & plan.   Patient expressed understanding and was in agreement with this plan. He also understands that He can call clinic at any time with any questions, concerns, or complaints.    Lloyd Huger, MD   12/30/2018 7:02 AM

## 2018-12-31 ENCOUNTER — Inpatient Hospital Stay: Payer: Federal, State, Local not specified - PPO | Admitting: Oncology

## 2019-01-04 ENCOUNTER — Other Ambulatory Visit: Payer: Self-pay

## 2019-01-04 ENCOUNTER — Encounter: Payer: Self-pay | Admitting: Oncology

## 2019-01-04 ENCOUNTER — Inpatient Hospital Stay: Payer: Federal, State, Local not specified - PPO | Attending: Oncology | Admitting: Oncology

## 2019-01-04 DIAGNOSIS — I2699 Other pulmonary embolism without acute cor pulmonale: Secondary | ICD-10-CM

## 2019-01-04 DIAGNOSIS — Z7901 Long term (current) use of anticoagulants: Secondary | ICD-10-CM

## 2019-01-04 NOTE — Progress Notes (Signed)
Patient stated that he had been doing well. 

## 2019-01-04 NOTE — Progress Notes (Signed)
[place]  Telephone:(336) [phone] Fax:(336) [phone]  ID: Hector [name] OB: 03-Oct-1960  MR#: [id number]  QIW#:[id number]  Patient Care Team: Burnis Medin, MD as PCP - General Wellington Hampshire, MD as PCP - Cardiology (Cardiology) Susa Day, MD (Orthopedic Surgery) Annia Belt, MD as Consulting Physician (Hematology and Oncology) Milagros Evener, NP as Nurse Practitioner Rana Snare, MD as Consulting Physician (Urology)  I connected with Hector [name] on 01/04/19 at 10:15 AM EDT by video enabled telemedicine visit and verified that I am speaking with the correct person using two identifiers.   I discussed the limitations, risks, security and privacy concerns of performing an evaluation and management service by telemedicine and the availability of in-person appointments. I also discussed with the patient that there may be a patient responsible charge related to this service. The patient expressed understanding and agreed to proceed.   Other persons participating in the visit and their role in the encounter: Patient, nursing  Patient's location: Home Provider's location: Clinic  CHIEF COMPLAINT: Pulmonary embolism  INTERVAL HISTORY: Patient agreed to video visit which ended as a phone call secondary to technical difficulties with video.  He continues to have mild shortness of breath and cough, but otherwise feels well and is back to work full-time.  He is tolerating Eliquis without significant side effects. He has no neurologic complaints.  He denies any recent fevers or illnesses.  He denies any chest pain or hemoptysis.  He denies any nausea, vomiting, constipation, or diarrhea.  He has good appetite and denies weight loss.  He has no urinary complaints.  Patient offers no further specific complaints today.  REVIEW OF SYSTEMS:   Review of Systems  Constitutional: Negative.  Negative for fever, malaise/fatigue and weight loss.  Respiratory: Positive  for cough and shortness of breath. Negative for hemoptysis.   Cardiovascular: Negative.  Negative for chest pain and leg swelling.  Gastrointestinal: Negative.  Negative for abdominal pain.  Genitourinary: Negative.  Negative for hematuria.  Musculoskeletal: Negative.  Negative for back pain.  Skin: Negative.  Negative for rash.  Neurological: Negative.  Negative for focal weakness, weakness and headaches.  Psychiatric/Behavioral: Negative.  The patient is not nervous/anxious.     As per HPI. Otherwise, a complete review of systems is negative.  PAST MEDICAL HISTORY: Past Medical History:  Diagnosis Date  . Abnormal LFTs    improved after gastic bypass  . ADJ DISORDER WITH MIXED ANXIETY & DEPRESSED MOOD 08/23/2009   Qualifier: Diagnosis of  By: [name] MD, Standley Brooking Now on lamictal and lexapro and elevil for sleep.   [name] Anxiety   . Ascending aortic aneurysm ([place])    a. 06/2018 CT: 4.1cm.  . B12 nutritional deficiency   . Chest pain    a. 2007 MV: No ischemia; b. 09/2018 recurrent c/p in setting of PE; c. 09/2018 MV: No ischemia/infarct. EF 55-65%.  . Closed head injury    laceration  9 12  no loc  vision change  . DDD (degenerative disc disease)   . Diabetes mellitus    resolved with gastric bypass  . GERD (gastroesophageal reflux disease)    no meds  . Gout   . Headache    once per week uses Indomethicin hx of cluster headaches   . History of bunionectomy of left great toe   . History of concussion   . History of echocardiogram    a. 09/2018 Echo: Ef 65-70%, no rwma, mild MR. Ao root 7mm (  mildly dil).  . History of renal stone    Seen in emergency room not urologist  . Hyperlipidemia   . Hypertension 02/27/2017  . OBESITY 12/10/2007   a. s/p gastric bypass.  . OSA (obstructive sleep apnea) 12/12/2013  . Pancreatitis 03/21/2016   went to [place]  . Perforated ulcer ([place])    Surgical repair9/24 2011  . Pulmonary embolism ([place])    a. 09/18/2018 CTA Chest: RLL segmental  arterial branch small volume PE-->Eliquis.    PAST SURGICAL HISTORY: Past Surgical History:  Procedure Laterality Date  . BACK SURGERY  2012   Dr. Dorina Hoyer disk  . CARPAL TUNNEL RELEASE  2008   left-ulnar nerve decomp  . carpel tunnel Left   . CHOLECYSTECTOMY N/A 03/21/2016   Procedure: LAPAROSCOPIC CHOLECYSTECTOMY WITH INTRAOPERATIVE CHOLANGIOGRAM;  Surgeon: Ralene Ok, MD;  Location: [facility];  Service: General;  Laterality: N/A;  . colonscopy     . EPIDURAL BLOCK INJECTION     by Dr. Nelva Bush  . FOOT SURGERY     bilat for removal of extra bone   . GASTRIC BYPASS  11/06/09   beginning weight 267  . HARDWARE REMOVAL N/A 05/02/2016   Procedure: Removal of hardware Lumbar five-Sacral one with Metrex;  Surgeon: Kristeen Miss, MD;  Location: [place] [facility];  Service: Neurosurgery;  Laterality: N/A;  Removal of hardware L5-S1 with Metrex  . HERNIA REPAIR  [id number]   umbilical  . HIATAL HERNIA REPAIR N/A 01/20/2013   Procedure: LAPAROSCOPIC REPAIR OF INTERNAL HERNIA;  Surgeon: Shann Medal, MD;  [facility];  Service: General;  Laterality: N/A;  . LUMBAR LAMINECTOMY/DECOMPRESSION MICRODISCECTOMY Left 06/07/2014   Procedure: MICRO LUMBAR DECOMPRESSION L5 - S1 ON THE LEFT/L5 FORAMINOTOMY/EXCISION SYNOVIAL CYST  1 LEVEL;  Surgeon: [name], MD;  [facility];  Service: Orthopedics;  Laterality: Left;  [name] MASS EXCISION Right 04/18/2013   Procedure: RIGHT SMALL FINGER SKIN LESION EXCISION;  Surgeon: Jolyn Nap, MD;  Location: [place];  Service: Orthopedics;  Laterality: Right;  . NASAL SEPTOPLASTY W/ TURBINOPLASTY     x 2  . NEPHROLITHOTOMY Right 11/03/2014   Procedure: RIGHT PERCUTANEOUS NEPHROLITHOTOMY;  Surgeon: Claybon Jabs, MD;  [facility];  Service: Urology;  Laterality: Right;  . SHOULDER ARTHROSCOPY  2014   rt  . surgerical repair of stomach ulcer  06/08/10   per  . TONSILLECTOMY    . UVULOPALATOPHARYNGOPLASTY  2007   with tonsils  . VASECTOMY       FAMILY HISTORY: Family History  Problem Relation Age of Onset  . Uterine cancer Mother   . Hypertension Mother   . Cervical cancer Mother   . Heart disease Father   . COPD Father   . Seizures Father   . Colon cancer Maternal Uncle   . Colon cancer Paternal Aunt     ADVANCED DIRECTIVES (Y/N):  N  HEALTH MAINTENANCE: Social History   Tobacco Use  . Smoking status: Never Smoker  . Smokeless tobacco: Never Used  Substance Use Topics  . Alcohol use: Yes    Comment: occas liquor   . Drug use: No     Colonoscopy:  PAP:  Bone density:  Lipid panel:  Allergies  Allergen Reactions  . Aspirin Other (See Comments)    perforated ulcer  . Cymbalta [Duloxetine Hcl] Other (See Comments)    Over-sedating; slept over 24 hours with one dose.  Possible serotonin syndrome.  . Tape Rash  Surgical tape  . Nsaids Other (See Comments)    ulcer Other reaction(s): Other (See Comments) Perforated ulcer  . Hydrocodone Other (See Comments)    headache  . Vancomycin Rash    Red man syndrome.  Please premedicate.    Current Outpatient Medications  Medication Sig Dispense Refill  . albuterol (PROVENTIL HFA;VENTOLIN HFA) 108 (90 Base) MCG/ACT inhaler Inhale 2 puffs into the lungs every 6 (six) hours as needed for wheezing or shortness of breath. 1 Inhaler 0  . apixaban (ELIQUIS) 5 MG TABS tablet Take 1 tablet by mouth every 12 (twelve) hours.    . Cholecalciferol (VITAMIN D-1000 MAX ST) 1000 units tablet Take 1,000 Units by mouth daily.    [name] escitalopram (LEXAPRO) 20 MG tablet Take 20 mg by mouth every morning.     . indomethacin (INDOCIN) 50 MG capsule Take 1 capsule (50 mg total) by mouth 2 (two) times daily with a meal. 60 capsule 1  . lamoTRIgine (LAMICTAL) 100 MG tablet Take 100 mg by mouth 2 (two) times daily.     [name] lisinopril (PRINIVIL,ZESTRIL) 10 MG tablet Take 1 tablet (10 mg total) by mouth daily. May increase to 20mg  if needed. Send BP readings in 1 month. 90 tablet 0  .  Multiple Vitamin (MULTIVITAMIN) tablet Take 1 tablet by mouth daily.    . nitroGLYCERIN (NITROSTAT) 0.3 MG SL tablet Place 1 tablet (0.3 mg total) under the tongue every 5 (five) minutes as needed for chest pain. 100 tablet 3  . oxyCODONE-acetaminophen (PERCOCET) 10-325 MG tablet oxycodone-acetaminophen 10 mg-325 mg tablet    . Spacer/Aero-Holding Chambers (E-Z SPACER) inhaler Use as instructed 1 each 2  . tamsulosin (FLOMAX) 0.4 MG CAPS capsule Take 1 capsule (0.4 mg total) by mouth daily. 30 capsule 1  . traZODone (DESYREL) 100 MG tablet Take 300 mg by mouth at bedtime.     . vitamin B-12 (CYANOCOBALAMIN) 1000 MCG tablet Take 1,000 mcg by mouth daily.     Current Facility-Administered Medications  Medication Dose Route Frequency Provider Last Rate Last Dose  . indomethacin (INDOCIN) capsule 50 mg  50 mg Oral TID PRN Wallene Huh, DPM        OBJECTIVE: There were no vitals filed for this visit.   There is no height or weight on file to calculate BMI.    ECOG FS:0 - Asymptomatic  General: Well-developed, well-nourished, no acute distress. HEENT: Normocephalic. Neuro: Alert, answering all questions appropriately. Cranial nerves grossly intact. Skin: No rashes or petechiae noted. Psych: Normal affect.   LAB RESULTS:  Lab Results  Component Value Date   NA 142 09/27/2018   K 4.4 09/27/2018   CL 109 09/27/2018   CO2 28 09/27/2018   GLUCOSE 94 09/27/2018   BUN 11 09/27/2018   CREATININE 1.28 (H) 09/27/2018   CALCIUM 8.3 (L) 09/27/2018   PROT 6.6 07/10/2018   ALBUMIN 3.9 07/10/2018   AST 40 07/10/2018   ALT 22 07/10/2018   ALKPHOS 76 07/10/2018   BILITOT 0.6 07/10/2018   GFRNONAA >60 09/27/2018   GFRAA >60 09/27/2018    Lab Results  Component Value Date   WBC 3.7 (L) 09/27/2018   NEUTROABS 4.1 07/10/2018   HGB 12.1 (L) 09/27/2018   HCT 36.0 (L) 09/27/2018   MCV 107.5 (H) 09/27/2018   PLT 126 (L) 09/27/2018     STUDIES: No results found.  ASSESSMENT: Pulmonary  embolism  PLAN:    1. Pulmonary embolism: CT scan results from September 19, 2017 reviewed  independently with improvement of right lower lobe segmental pulmonary embolism after 1 week of treatment.  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  He had no obvious transient risk factors.  He broke 3 left sided ribs in October 2019,, but these were on the opposite side of his pulmonary embolism.  He has no personal or family history of DVT or blood clot.  Full hypercoagulable work-up in January was reported as negative.  After lengthy discussion with the patient, it was agreed upon that since he is still mildly symptomatic with cough and shortness of breath, he will continue with 3 additional months of Eliquis completing a total of 6 months of treatment in July 2020.  He has been instructed to discontinue treatment at that time.  Patient expressed understanding that he is at increased risk to have a second DVT over the general population and if he had a second blood clot for any reason would likely recommend lifelong anticoagulation at that time.  No further follow-up has been scheduled.   I provided 20 minutes of face-to-face video visit time during this encounter, and > 50% was spent counseling as documented under my assessment & plan.   Patient expressed understanding and was in agreement with this plan. He also understands that He can call clinic at any time with any questions, concerns, or complaints.    Lloyd Huger, MD   01/04/2019 2:33 PM

## 2019-02-08 DIAGNOSIS — Z20828 Contact with and (suspected) exposure to other viral communicable diseases: Secondary | ICD-10-CM | POA: Diagnosis not present

## 2019-02-14 DIAGNOSIS — R609 Edema, unspecified: Secondary | ICD-10-CM | POA: Diagnosis not present

## 2019-02-14 DIAGNOSIS — M109 Gout, unspecified: Secondary | ICD-10-CM | POA: Diagnosis not present

## 2019-02-14 DIAGNOSIS — M2540 Effusion, unspecified joint: Secondary | ICD-10-CM | POA: Diagnosis not present

## 2019-02-14 DIAGNOSIS — M25562 Pain in left knee: Secondary | ICD-10-CM | POA: Diagnosis not present

## 2019-02-22 DIAGNOSIS — M25562 Pain in left knee: Secondary | ICD-10-CM | POA: Diagnosis not present

## 2019-03-08 DIAGNOSIS — M1712 Unilateral primary osteoarthritis, left knee: Secondary | ICD-10-CM | POA: Diagnosis not present

## 2019-03-08 DIAGNOSIS — M25562 Pain in left knee: Secondary | ICD-10-CM | POA: Diagnosis not present

## 2019-03-17 DIAGNOSIS — R5381 Other malaise: Secondary | ICD-10-CM | POA: Diagnosis not present

## 2019-03-17 DIAGNOSIS — R42 Dizziness and giddiness: Secondary | ICD-10-CM | POA: Diagnosis not present

## 2019-03-17 DIAGNOSIS — I10 Essential (primary) hypertension: Secondary | ICD-10-CM | POA: Diagnosis not present

## 2019-03-17 DIAGNOSIS — R05 Cough: Secondary | ICD-10-CM | POA: Diagnosis not present

## 2019-03-21 ENCOUNTER — Emergency Department
Admission: EM | Admit: 2019-03-21 | Discharge: 2019-03-22 | Disposition: A | Discharge status: Home / Self Care | Payer: Federal, State, Local not specified - PPO | Attending: Emergency Medicine | Admitting: Emergency Medicine

## 2019-03-21 ENCOUNTER — Emergency Department: Payer: Federal, State, Local not specified - PPO

## 2019-03-21 ENCOUNTER — Other Ambulatory Visit: Payer: Self-pay

## 2019-03-21 DIAGNOSIS — R0602 Shortness of breath: Secondary | ICD-10-CM | POA: Diagnosis not present

## 2019-03-21 DIAGNOSIS — R0789 Other chest pain: Secondary | ICD-10-CM | POA: Diagnosis not present

## 2019-03-21 DIAGNOSIS — K209 Esophagitis, unspecified without bleeding: Secondary | ICD-10-CM

## 2019-03-21 DIAGNOSIS — E119 Type 2 diabetes mellitus without complications: Secondary | ICD-10-CM | POA: Insufficient documentation

## 2019-03-21 DIAGNOSIS — I10 Essential (primary) hypertension: Secondary | ICD-10-CM | POA: Insufficient documentation

## 2019-03-21 DIAGNOSIS — R079 Chest pain, unspecified: Secondary | ICD-10-CM

## 2019-03-21 DIAGNOSIS — Z79899 Other long term (current) drug therapy: Secondary | ICD-10-CM | POA: Insufficient documentation

## 2019-03-21 DIAGNOSIS — Z7901 Long term (current) use of anticoagulants: Secondary | ICD-10-CM | POA: Diagnosis not present

## 2019-03-21 LAB — BASIC METABOLIC PANEL
Anion gap: 7 (ref 5–15)
BUN: 10 mg/dL (ref 6–20)
CO2: 24 mmol/L (ref 22–32)
Calcium: 8.8 mg/dL — ABNORMAL LOW (ref 8.9–10.3)
Chloride: 109 mmol/L (ref 98–111)
Creatinine, Ser: 1.14 mg/dL (ref 0.61–1.24)
GFR calc Af Amer: >60 mL/min (ref >60)
GFR calc non Af Amer: >60 mL/min (ref >60)
Glucose, Bld: 157 mg/dL — ABNORMAL HIGH (ref 70–99)
Potassium: 5.5 mmol/L — ABNORMAL HIGH (ref 3.5–5.1)
Sodium: 140 mmol/L (ref 135–145)

## 2019-03-21 LAB — CBC
HCT: 41.1 % (ref 39.0–52.0)
Hemoglobin: 13.8 g/dL (ref 13.0–17.0)
MCH: 35.1 pg — ABNORMAL HIGH (ref 26.0–34.0)
MCHC: 33.6 g/dL (ref 30.0–36.0)
MCV: 104.6 fL — ABNORMAL HIGH (ref 80.0–100.0)
Platelets: 147 10*3/uL — ABNORMAL LOW (ref 150–400)
RBC: 3.93 MIL/uL — ABNORMAL LOW (ref 4.22–5.81)
RDW: 13.8 % (ref 11.5–15.5)
WBC: 5.1 10*3/uL (ref 4.0–10.5)
nRBC: 0 % (ref 0.0–0.2)

## 2019-03-21 LAB — TROPONIN I (HIGH SENSITIVITY): Troponin I (High Sensitivity): 6 ng/L (ref <18)

## 2019-03-21 MED ORDER — IOHEXOL 350 MG/ML SOLN
75.0000 mL | Freq: Once | INTRAVENOUS | Status: AC | PRN
  Administered 2019-03-22: 75 mL via INTRAVENOUS

## 2019-03-21 MED ORDER — LIDOCAINE VISCOUS HCL 2 % MT SOLN
15.0000 mL | Freq: Once | OROMUCOSAL | Status: AC
  Administered 2019-03-21: 15 mL via OROMUCOSAL
  Filled 2019-03-21: qty 15

## 2019-03-21 NOTE — ED Triage Notes (Signed)
Centralized chest pain that started at 1400, SOB. Pt appears fatigued. Hx of PE in January.

## 2019-03-21 NOTE — ED Provider Notes (Signed)
[facility] Emergency Department Provider Note   ____________________________________________   I have reviewed the triage vital signs and the nursing notes.   HISTORY  Chief Complaint Chest Pain   History limited by: Not Limited   HPI Hector [name] is a 58 y.o. male who presents to the emergency department today because of concern for chest pain. He started noticing the discomfort this morning. It built throughout the day. It is locate din the center chest. Described as pressure. Has also had some increased shortness of breath today. Has had a somewhat chronic cough. The patient states that he had a PE roughly 6 months ago. Unclear etiology of the PE at that time. Today's symptoms reminds him of the PE. No longer on the eliquis. Denies any fevers.   Records reviewed. Per medical record review patient has a history of PE  Past Medical History:  Diagnosis Date  . Abnormal LFTs    improved after gastic bypass  . ADJ DISORDER WITH MIXED ANXIETY & DEPRESSED MOOD 08/23/2009   Qualifier: Diagnosis of  By: [name] MD, Standley Brooking Now on lamictal and lexapro and elevil for sleep.   [name] Anxiety   . Ascending aortic aneurysm ([place])    a. 06/2018 CT: 4.1cm.  . B12 nutritional deficiency   . Chest pain    a. 2007 MV: No ischemia; b. 09/2018 recurrent c/p in setting of PE; c. 09/2018 MV: No ischemia/infarct. EF 55-65%.  . Closed head injury    laceration  9 12  no loc  vision change  . DDD (degenerative disc disease)   . Diabetes mellitus    resolved with gastric bypass  . GERD (gastroesophageal reflux disease)    no meds  . Gout   . Headache    once per week uses Indomethicin hx of cluster headaches   . History of bunionectomy of left great toe   . History of concussion   . History of echocardiogram    a. 09/2018 Echo: Ef 65-70%, no rwma, mild MR. Ao root 10mm (mildly dil).  . History of renal stone    Seen in emergency room not urologist  . Hyperlipidemia   .  Hypertension 02/27/2017  . OBESITY 12/10/2007   a. s/p gastric bypass.  . OSA (obstructive sleep apnea) 12/12/2013  . Pancreatitis 03/21/2016   went to [place]  . Perforated ulcer ([place])    Surgical repair9/24 2011  . Pulmonary embolism ([place])    a. 09/18/2018 CTA Chest: RLL segmental arterial branch small volume PE-->Eliquis.    Patient Active Problem List   Diagnosis Date Noted  . Osteoarthritis of knee 11/02/2018  . Chest pain 09/26/2018  . Pulmonary embolism ([place]) 09/24/2018  . Hypertension 02/27/2017  . Acute gastroenteritis 11/24/2016  . Sepsis ([place]) 11/24/2016  . AKI (acute kidney injury) ([place]) 11/24/2016  . Hypokalemia 11/24/2016  . Chronic low back pain 11/24/2016  . Pancreatitis 03/19/2016  . Anxiety 03/03/2016  . Nasal valve collapse 11/26/2015  . Perennial allergic rhinitis 11/26/2015  . Renal calculi   . 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 Type II or unspecified type diabetes mellitus without mention of complication, not stated as uncontrolled 05/15/2014  . Lumbar back pain with radiculopathy affecting left lower extremity 05/15/2014  . Medication management 05/15/2014  . Pre-op evaluation 05/15/2014  . Insufficient treatment with nasal CPAP 04/24/2014  . 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 Hypoxemia 04/24/2014  . OSA (obstructive sleep apnea) 12/12/2013  .  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 History of renal stone   . Colon cancer screening 12/28/2012  . GERD (gastroesophageal reflux disease) 12/28/2012  . Visit for preventive health examination 09/06/2012  . History of gout 09/06/2012  . Easy bruising 03/24/2012  . Headaches due to old head injury 03/24/2012  . Insomnia 03/24/2012  . BACK PAIN WITH RADICULOPATHY 10/07/2010  . Thrombocytopenia ([place]) 05/24/2010  . ADJ DISORDER WITH MIXED ANXIETY & DEPRESSED MOOD 08/23/2009  . BACK PAIN 10/20/2008  . UNS ADVRS EFF OTH RX  MEDICINAL&BIOLOGICAL SBSTNC 02/04/2008  . GOUT 08/02/2007  . ASTHMA 07/22/2007  . B12 deficiency 04/06/2007  . CARPAL TUNNEL SYNDROME 04/06/2007    Past Surgical History:  Procedure Laterality Date  . BACK SURGERY  2012   Dr. Dorina Hoyer disk  . CARPAL TUNNEL RELEASE  2008   left-ulnar nerve decomp  . carpel tunnel Left   . CHOLECYSTECTOMY N/A 03/21/2016   Procedure: LAPAROSCOPIC CHOLECYSTECTOMY WITH INTRAOPERATIVE CHOLANGIOGRAM;  Surgeon: Ralene Ok, MD;  Location: [facility];  Service: General;  Laterality: N/A;  . colonscopy     . EPIDURAL BLOCK INJECTION     by Dr. Nelva Bush  . FOOT SURGERY     bilat for removal of extra bone   . GASTRIC BYPASS  11/06/09   beginning weight 267  . HARDWARE REMOVAL N/A 05/02/2016   Procedure: Removal of hardware Lumbar five-Sacral one with Metrex;  Surgeon: Kristeen Miss, MD;  Location: [facility];  Service: Neurosurgery;  Laterality: N/A;  Removal of hardware L5-S1 with Metrex  . HERNIA REPAIR  8315   umbilical  . HIATAL HERNIA REPAIR N/A 01/20/2013   Procedure: LAPAROSCOPIC REPAIR OF INTERNAL HERNIA;  Surgeon: Shann Medal, MD;  Location: [facility];  Service: General;  Laterality: N/A;  . LUMBAR LAMINECTOMY/DECOMPRESSION MICRODISCECTOMY Left 06/07/2014   Procedure: MICRO LUMBAR DECOMPRESSION L5 - S1 ON THE LEFT/L5 FORAMINOTOMY/EXCISION SYNOVIAL CYST  1 LEVEL;  Surgeon: [name], MD;  Location: [facility];  Service: Orthopedics;  Laterality: Left;  [name] MASS EXCISION Right 04/18/2013   Procedure: RIGHT SMALL FINGER SKIN LESION EXCISION;  Surgeon: Jolyn Nap, MD;  Location: [place];  Service: Orthopedics;  Laterality: Right;  . NASAL SEPTOPLASTY W/ TURBINOPLASTY     x 2  . NEPHROLITHOTOMY Right 11/03/2014   Procedure: RIGHT PERCUTANEOUS NEPHROLITHOTOMY;  Surgeon: Claybon Jabs, MD;  Location: [facility];  Service: Urology;  Laterality: Right;  . SHOULDER ARTHROSCOPY  2014   rt  . surgerical repair of stomach ulcer  06/08/10   per  .  TONSILLECTOMY    . UVULOPALATOPHARYNGOPLASTY  2007   with tonsils  . VASECTOMY      Prior to Admission medications   Medication Sig Start Date End Date Taking? Authorizing Provider  albuterol (PROVENTIL HFA;VENTOLIN HFA) 108 (90 Base) MCG/ACT inhaler Inhale 2 puffs into the lungs every 6 (six) hours as needed for wheezing or shortness of breath. 09/03/18   Caren Macadam, MD  apixaban (ELIQUIS) 5 MG TABS tablet Take 1 tablet by mouth every 12 (twelve) hours. 12/23/18   [provider]  Cholecalciferol (VITAMIN D-1000 MAX ST) 1000 units tablet Take 1,000 Units by mouth daily.    [provider]  escitalopram (LEXAPRO) 20 MG tablet Take 20 mg by mouth every morning.     [provider]  indomethacin (INDOCIN) 50 MG capsule Take 1 capsule (50 mg total) by mouth 2 (two) times daily with a meal. 10/19/18  Wallene Huh, DPM  lamoTRIgine (LAMICTAL) 100 MG tablet Take 100 mg by mouth 2 (two) times daily.     [provider]  lisinopril (PRINIVIL,ZESTRIL) 10 MG tablet Take 1 tablet (10 mg total) by mouth daily. May increase to 20mg  if needed. Send BP readings in 1 month. 12/29/17   Panosh, Standley Brooking, MD  Multiple Vitamin (MULTIVITAMIN) tablet Take 1 tablet by mouth daily.    [provider]  nitroGLYCERIN (NITROSTAT) 0.3 MG SL tablet Place 1 tablet (0.3 mg total) under the tongue every 5 (five) minutes as needed for chest pain. 09/27/18 09/27/19  Epifanio Lesches, MD  oxyCODONE-acetaminophen (PERCOCET) 10-325 MG tablet oxycodone-acetaminophen 10 mg-325 mg tablet    [provider]  Spacer/Aero-Holding Chambers (E-Z SPACER) inhaler Use as instructed 09/03/18   Caren Macadam, MD  tamsulosin (FLOMAX) 0.4 MG CAPS capsule Take 1 capsule (0.4 mg total) by mouth daily. 10/29/17   Stoioff, Ronda Fairly, MD  traZODone (DESYREL) 100 MG tablet Take 300 mg by mouth at bedtime.     [provider]  vitamin B-12 (CYANOCOBALAMIN) 1000 MCG tablet Take  1,000 mcg by mouth daily.    [provider]    Allergies Aspirin, Cymbalta [duloxetine hcl], Tape, Nsaids, Hydrocodone, and Vancomycin  Family History  Problem Relation Age of Onset  . Uterine cancer Mother   . Hypertension Mother   . Cervical cancer Mother   . Heart disease Father   . COPD Father   . Seizures Father   . Colon cancer Maternal Uncle   . Colon cancer Paternal Aunt     Social History Social History   Tobacco Use  . Smoking status: Never Smoker  . Smokeless tobacco: Never Used  Substance Use Topics  . Alcohol use: Yes    Comment: occas liquor   . Drug use: No    Review of Systems Constitutional: No fever/chills Eyes: No visual changes. ENT: No sore throat. Cardiovascular: Positive chest pain. Respiratory: Positive shortness of breath. Gastrointestinal: No abdominal pain.  No nausea, no vomiting.  No diarrhea.   Genitourinary: Negative for dysuria. Musculoskeletal: Negative for back pain. Skin: Negative for rash. Neurological: Negative for headaches, focal weakness or numbness.  ____________________________________________   PHYSICAL EXAM:  VITAL SIGNS: ED Triage Vitals [03/21/19 1857]  Enc Vitals Group     BP 108/72     Pulse Rate 92     Resp 18     Temp 99 F (37.2 C)     Temp Source Oral     SpO2 95 %     Weight 172 lb (78 kg)     Height 5\' 10"  (1.778 m)     Head Circumference      Peak Flow      Pain Score 7   Constitutional: Alert and oriented.  Eyes: Conjunctivae are normal.  ENT      Head: Normocephalic and atraumatic.      Nose: No congestion/rhinnorhea.      Mouth/Throat: Mucous membranes are moist.      Neck: No stridor. Hematological/Lymphatic/Immunilogical: No cervical lymphadenopathy. Cardiovascular: Normal rate, regular rhythm.  No murmurs, rubs, or gallops.  Respiratory: Normal respiratory effort without tachypnea nor retractions. Breath sounds are clear and equal bilaterally. No  wheezes/rales/rhonchi. Gastrointestinal: Soft and non tender. No rebound. No guarding.  Genitourinary: Deferred Musculoskeletal: Normal range of motion in all extremities. No lower extremity edema. Neurologic:  Normal speech and language. No gross focal neurologic deficits are appreciated.  Skin:  Skin  is warm, dry and intact. No rash noted. Psychiatric: Mood and affect are normal. Speech and behavior are normal. Patient exhibits appropriate insight and judgment.  ____________________________________________    LABS (pertinent positives/negatives)  Trop hs 6 CBC wbc 5.1, hgb 13.8, plt 147 BMP wnl except k 5.5, glu 157, ca 8.8 ____________________________________________   EKG  I, Nance Pear, attending physician, personally viewed and interpreted this EKG  EKG Time: 1858 Rate: 89 Rhythm: normal sinus rhythm Axis: left axis deviation Intervals: qtc 423 QRS: LAFB ST changes: no st elevation Impression: abnormal ekg ____________________________________________    RADIOLOGY  CXR No acute abnormality  CTAPE No PE. Ectatic ascending aorta.   ____________________________________________   PROCEDURES  Procedures  ____________________________________________   INITIAL IMPRESSION / ASSESSMENT AND PLAN / ED COURSE  Pertinent labs & imaging results that were available during my care of the patient were reviewed by me and considered in my medical decision making (see chart for details).   Patient presented to the emergency department today because of concerns for chest pain.  Did have some shortness of breath.  Differential would be broad including ACS, PE, pneumonia, pneumothorax, aortic dissection, esophagitis amongst other etiologies.  Patient's work-up here without concerning findings.  Minimal elevation of potassium.  CT angios PE was obtained given history of PE with out any evidence of PE.  Did show an ectatic ascending aorta.  I discussed these findings with  patient.  Discussed getting her blood rechecked with primary care for elevated potassium.  Patient states he is already following up with someone for his ectatic aorta.  He did feel better after viscous lidocaine so I wonder patient is suffering from esophagitis.  Discussed with patient initiating antiacid. ____________________________________________   FINAL CLINICAL IMPRESSION(S) / ED DIAGNOSES  Final diagnoses:  Nonspecific chest pain  Esophagitis     Note: This dictation was prepared with Dragon dictation. Any transcriptional errors that result from this process are unintentional     Nance Pear, MD 03/22/19 [phone]

## 2019-03-22 DIAGNOSIS — R079 Chest pain, unspecified: Secondary | ICD-10-CM | POA: Diagnosis not present

## 2019-03-22 DIAGNOSIS — M25562 Pain in left knee: Secondary | ICD-10-CM | POA: Diagnosis not present

## 2019-03-22 NOTE — ED Notes (Signed)
Patient to CT.

## 2019-03-22 NOTE — Discharge Instructions (Addendum)
As we discussed you can try starting an antacid. Please continue to follow up with your doctor for the enlarged aorta. Please seek medical attention for any high fevers, chest pain, shortness of breath, change in behavior, persistent vomiting, bloody stool or any other new or concerning symptoms.

## 2019-04-22 ENCOUNTER — Ambulatory Visit (INDEPENDENT_AMBULATORY_CARE_PROVIDER_SITE_OTHER): Payer: Federal, State, Local not specified - PPO

## 2019-04-22 ENCOUNTER — Ambulatory Visit: Payer: Federal, State, Local not specified - PPO | Admitting: Podiatry

## 2019-04-22 ENCOUNTER — Encounter: Payer: Self-pay | Admitting: Podiatry

## 2019-04-22 ENCOUNTER — Other Ambulatory Visit: Payer: Self-pay

## 2019-04-22 VITALS — Temp 97.2°F

## 2019-04-22 DIAGNOSIS — M1000 Idiopathic gout, unspecified site: Secondary | ICD-10-CM | POA: Diagnosis not present

## 2019-04-22 DIAGNOSIS — M7752 Other enthesopathy of left foot: Secondary | ICD-10-CM

## 2019-04-22 DIAGNOSIS — M779 Enthesopathy, unspecified: Secondary | ICD-10-CM

## 2019-04-22 DIAGNOSIS — M778 Other enthesopathies, not elsewhere classified: Secondary | ICD-10-CM

## 2019-04-22 MED ORDER — METHYLPREDNISOLONE 4 MG PO TBPK: ORAL_TABLET | ORAL | 0 refills | Status: DC

## 2019-04-22 MED ORDER — ALLOPURINOL 100 MG PO TABS: 100.0000 mg | ORAL_TABLET | Freq: Every day | ORAL | 6 refills | Status: DC

## 2019-04-22 NOTE — Progress Notes (Signed)
Subjective:   Patient ID: Hector [name], male   DOB: 58 y.o.   MRN: [id number]   HPI Patient presents with inflammation pain and swelling of the big toe left and also mostly in the left forefoot with the pain is focused mostly around the second MPJ joint   ROS      Objective:  Physical Exam  Probability for gout with patient apparently having a elevated uric acid when checked with inflammation fluid mostly around the second MPJ left     Assessment:  Inflammatory capsulitis second MPJ left with overall gout that has not responded conservatively currently     Plan:  H&P reviewed condition and I do think ultimately he needs to be on medication and restart on allopurinol for the gout attacks he continues to experience.  I did also place him on Medrol six-day Dosepak and I did an injection of the left forefoot 60 mg like Marcaine mixture and I aspirated the second MPJ getting out a small amount of clear fluid and injected quarter cc dexamethasone Kenalog and patient will be seen back if symptoms indicated and will start gout and will let his family physician know and will be kept on it by him  X-ray was negative for signs of fracture or indications of bone pathology around the second MPJ

## 2019-04-22 NOTE — Patient Instructions (Signed)

## 2019-04-27 ENCOUNTER — Other Ambulatory Visit: Payer: Self-pay

## 2019-04-27 ENCOUNTER — Encounter: Payer: Self-pay | Admitting: Podiatry

## 2019-04-27 ENCOUNTER — Ambulatory Visit: Payer: Federal, State, Local not specified - PPO | Admitting: Podiatry

## 2019-04-27 VITALS — Temp 98.1°F

## 2019-04-27 DIAGNOSIS — M778 Other enthesopathies, not elsewhere classified: Secondary | ICD-10-CM

## 2019-04-27 DIAGNOSIS — M779 Enthesopathy, unspecified: Secondary | ICD-10-CM

## 2019-04-27 DIAGNOSIS — M1000 Idiopathic gout, unspecified site: Secondary | ICD-10-CM

## 2019-04-27 MED ORDER — COLCHICINE 0.6 MG PO TABS: 0.6000 mg | ORAL_TABLET | Freq: Two times a day (BID) | ORAL | 1 refills | Status: DC

## 2019-04-27 MED ORDER — OXYCODONE-ACETAMINOPHEN 10-325 MG PO TABS: 1.0000 | ORAL_TABLET | ORAL | 0 refills | Status: DC | PRN

## 2019-04-27 NOTE — Progress Notes (Signed)
Subjective:   Patient ID: Hector [name], male   DOB: 58 y.o.   MRN: [id number]   HPI Patient states he still having severe pain in his left foot and it seems to be in the forefoot and it is really been difficult for him to ambulate and where I did the procedure seems somewhat better but it seems like there is still a lot of pain in that area   ROS      Objective:  Physical Exam  Neurovascular status intact with patient's left second MPJ somewhat improved but severe pain in the third MPJ     Assessment:  Appears to be continued gout that is moved into the third MPJ     Plan:  I reviewed condition and at this point I did go ahead and I started him on colchicine and I did an injection of the third MPJ 3 mg Dexasone Kenalog 5 mg Xylocaine and advised on reduced activity.  Reappoint if symptoms persist

## 2019-05-05 DIAGNOSIS — F4312 Post-traumatic stress disorder, chronic: Secondary | ICD-10-CM | POA: Diagnosis not present

## 2019-05-09 DIAGNOSIS — Z1389 Encounter for screening for other disorder: Secondary | ICD-10-CM | POA: Diagnosis not present

## 2019-05-09 DIAGNOSIS — M109 Gout, unspecified: Secondary | ICD-10-CM | POA: Diagnosis not present

## 2019-05-09 DIAGNOSIS — Z125 Encounter for screening for malignant neoplasm of prostate: Secondary | ICD-10-CM | POA: Diagnosis not present

## 2019-05-09 DIAGNOSIS — E119 Type 2 diabetes mellitus without complications: Secondary | ICD-10-CM | POA: Diagnosis not present

## 2019-05-09 DIAGNOSIS — Z13220 Encounter for screening for lipoid disorders: Secondary | ICD-10-CM | POA: Diagnosis not present

## 2019-05-09 DIAGNOSIS — Z0000 Encounter for general adult medical examination without abnormal findings: Secondary | ICD-10-CM | POA: Diagnosis not present

## 2019-05-11 DIAGNOSIS — R200 Anesthesia of skin: Secondary | ICD-10-CM | POA: Diagnosis not present

## 2019-05-11 DIAGNOSIS — F4312 Post-traumatic stress disorder, chronic: Secondary | ICD-10-CM | POA: Diagnosis not present

## 2019-05-16 DIAGNOSIS — I10 Essential (primary) hypertension: Secondary | ICD-10-CM | POA: Diagnosis not present

## 2019-05-16 DIAGNOSIS — E119 Type 2 diabetes mellitus without complications: Secondary | ICD-10-CM | POA: Diagnosis not present

## 2019-05-16 DIAGNOSIS — Z0000 Encounter for general adult medical examination without abnormal findings: Secondary | ICD-10-CM | POA: Diagnosis not present

## 2019-05-16 DIAGNOSIS — M1A0720 Idiopathic chronic gout, left ankle and foot, without tophus (tophi): Secondary | ICD-10-CM | POA: Diagnosis not present

## 2019-05-18 DIAGNOSIS — F4312 Post-traumatic stress disorder, chronic: Secondary | ICD-10-CM | POA: Diagnosis not present

## 2019-05-25 DIAGNOSIS — F4312 Post-traumatic stress disorder, chronic: Secondary | ICD-10-CM | POA: Diagnosis not present

## 2019-05-27 ENCOUNTER — Other Ambulatory Visit: Payer: Self-pay | Admitting: Podiatry

## 2019-06-01 DIAGNOSIS — F4312 Post-traumatic stress disorder, chronic: Secondary | ICD-10-CM | POA: Diagnosis not present

## 2019-06-02 DIAGNOSIS — M542 Cervicalgia: Secondary | ICD-10-CM | POA: Diagnosis not present

## 2019-06-02 DIAGNOSIS — R29818 Other symptoms and signs involving the nervous system: Secondary | ICD-10-CM | POA: Diagnosis not present

## 2019-06-02 DIAGNOSIS — G5602 Carpal tunnel syndrome, left upper limb: Secondary | ICD-10-CM | POA: Diagnosis not present

## 2019-06-02 DIAGNOSIS — G5622 Lesion of ulnar nerve, left upper limb: Secondary | ICD-10-CM | POA: Diagnosis not present

## 2019-06-03 DIAGNOSIS — M9971 Connective tissue and disc stenosis of intervertebral foramina of cervical region: Secondary | ICD-10-CM | POA: Diagnosis not present

## 2019-06-03 DIAGNOSIS — R29818 Other symptoms and signs involving the nervous system: Secondary | ICD-10-CM | POA: Diagnosis not present

## 2019-06-07 ENCOUNTER — Other Ambulatory Visit: Payer: Self-pay | Admitting: Podiatry

## 2019-06-07 NOTE — Telephone Encounter (Signed)
I called pt and informed that Dr. Paulla Dolly had wanted to see him again if he was having problems, and we had received a request for refill of the colchicine for gout. Pt states he had not requested the refill because he was currently fine, and only used for gout flares.

## 2019-06-13 DIAGNOSIS — M9971 Connective tissue and disc stenosis of intervertebral foramina of cervical region: Secondary | ICD-10-CM | POA: Diagnosis not present

## 2019-06-13 DIAGNOSIS — M542 Cervicalgia: Secondary | ICD-10-CM | POA: Diagnosis not present

## 2019-06-15 DIAGNOSIS — M9971 Connective tissue and disc stenosis of intervertebral foramina of cervical region: Secondary | ICD-10-CM | POA: Diagnosis not present

## 2019-06-15 DIAGNOSIS — M542 Cervicalgia: Secondary | ICD-10-CM | POA: Diagnosis not present

## 2019-06-21 DIAGNOSIS — M542 Cervicalgia: Secondary | ICD-10-CM | POA: Diagnosis not present

## 2019-06-21 DIAGNOSIS — M9971 Connective tissue and disc stenosis of intervertebral foramina of cervical region: Secondary | ICD-10-CM | POA: Diagnosis not present

## 2019-06-22 DIAGNOSIS — F4312 Post-traumatic stress disorder, chronic: Secondary | ICD-10-CM | POA: Diagnosis not present

## 2019-06-23 DIAGNOSIS — M542 Cervicalgia: Secondary | ICD-10-CM | POA: Diagnosis not present

## 2019-06-23 DIAGNOSIS — M9971 Connective tissue and disc stenosis of intervertebral foramina of cervical region: Secondary | ICD-10-CM | POA: Diagnosis not present

## 2019-07-04 DIAGNOSIS — M9971 Connective tissue and disc stenosis of intervertebral foramina of cervical region: Secondary | ICD-10-CM | POA: Diagnosis not present

## 2019-07-04 DIAGNOSIS — Z6825 Body mass index (BMI) 25.0-25.9, adult: Secondary | ICD-10-CM | POA: Diagnosis not present

## 2019-07-04 DIAGNOSIS — M5412 Radiculopathy, cervical region: Secondary | ICD-10-CM | POA: Diagnosis not present

## 2019-07-06 DIAGNOSIS — F4312 Post-traumatic stress disorder, chronic: Secondary | ICD-10-CM | POA: Diagnosis not present

## 2019-07-19 DIAGNOSIS — Z1159 Encounter for screening for other viral diseases: Secondary | ICD-10-CM | POA: Diagnosis not present

## 2019-07-25 DIAGNOSIS — M5412 Radiculopathy, cervical region: Secondary | ICD-10-CM | POA: Diagnosis not present

## 2019-07-25 DIAGNOSIS — M4722 Other spondylosis with radiculopathy, cervical region: Secondary | ICD-10-CM | POA: Diagnosis not present

## 2019-07-25 DIAGNOSIS — M50223 Other cervical disc displacement at C6-C7 level: Secondary | ICD-10-CM | POA: Diagnosis not present

## 2019-07-25 DIAGNOSIS — M50222 Other cervical disc displacement at C5-C6 level: Secondary | ICD-10-CM | POA: Diagnosis not present

## 2019-07-30 ENCOUNTER — Other Ambulatory Visit: Payer: Self-pay

## 2019-07-30 ENCOUNTER — Emergency Department
Admission: EM | Admit: 2019-07-30 | Discharge: 2019-07-30 | Disposition: A | Discharge status: Home / Self Care | Payer: Federal, State, Local not specified - PPO | Attending: Emergency Medicine | Admitting: Emergency Medicine

## 2019-07-30 ENCOUNTER — Emergency Department: Payer: Federal, State, Local not specified - PPO

## 2019-07-30 ENCOUNTER — Encounter: Payer: Self-pay | Admitting: Emergency Medicine

## 2019-07-30 DIAGNOSIS — R531 Weakness: Secondary | ICD-10-CM | POA: Insufficient documentation

## 2019-07-30 DIAGNOSIS — E119 Type 2 diabetes mellitus without complications: Secondary | ICD-10-CM | POA: Insufficient documentation

## 2019-07-30 DIAGNOSIS — M542 Cervicalgia: Secondary | ICD-10-CM | POA: Diagnosis not present

## 2019-07-30 DIAGNOSIS — Z86711 Personal history of pulmonary embolism: Secondary | ICD-10-CM | POA: Insufficient documentation

## 2019-07-30 DIAGNOSIS — J45909 Unspecified asthma, uncomplicated: Secondary | ICD-10-CM | POA: Insufficient documentation

## 2019-07-30 DIAGNOSIS — I10 Essential (primary) hypertension: Secondary | ICD-10-CM | POA: Diagnosis not present

## 2019-07-30 DIAGNOSIS — M4322 Fusion of spine, cervical region: Secondary | ICD-10-CM | POA: Diagnosis not present

## 2019-07-30 DIAGNOSIS — R0602 Shortness of breath: Secondary | ICD-10-CM | POA: Diagnosis not present

## 2019-07-30 DIAGNOSIS — R5383 Other fatigue: Secondary | ICD-10-CM | POA: Diagnosis not present

## 2019-07-30 DIAGNOSIS — Z79899 Other long term (current) drug therapy: Secondary | ICD-10-CM | POA: Diagnosis not present

## 2019-07-30 LAB — CBC
HCT: 48.9 % (ref 39.0–52.0)
Hemoglobin: 17.3 g/dL — ABNORMAL HIGH (ref 13.0–17.0)
MCH: 33.8 pg (ref 26.0–34.0)
MCHC: 35.4 g/dL (ref 30.0–36.0)
MCV: 95.5 fL (ref 80.0–100.0)
Platelets: 177 K/uL (ref 150–400)
RBC: 5.12 MIL/uL (ref 4.22–5.81)
RDW: 11.8 % (ref 11.5–15.5)
WBC: 9.3 K/uL (ref 4.0–10.5)
nRBC: 0 % (ref 0.0–0.2)

## 2019-07-30 LAB — BASIC METABOLIC PANEL
Anion gap: 13 (ref 5–15)
BUN: 14 mg/dL (ref 6–20)
CO2: 24 mmol/L (ref 22–32)
Calcium: 9.2 mg/dL (ref 8.9–10.3)
Chloride: 101 mmol/L (ref 98–111)
Creatinine, Ser: 1.38 mg/dL — ABNORMAL HIGH (ref 0.61–1.24)
GFR calc Af Amer: >60 mL/min (ref >60)
GFR calc non Af Amer: 56 mL/min — ABNORMAL LOW (ref >60)
Glucose, Bld: 144 mg/dL — ABNORMAL HIGH (ref 70–99)
Potassium: 5.4 mmol/L — ABNORMAL HIGH (ref 3.5–5.1)
Sodium: 138 mmol/L (ref 135–145)

## 2019-07-30 LAB — URINALYSIS, COMPLETE (UACMP) WITH MICROSCOPIC
Bacteria, UA: NONE SEEN
Bilirubin Urine: NEGATIVE
Glucose, UA: NEGATIVE mg/dL
Hgb urine dipstick: NEGATIVE
Ketones, ur: 5 mg/dL — AB
Leukocytes,Ua: NEGATIVE
Nitrite: NEGATIVE
Protein, ur: NEGATIVE mg/dL
Specific Gravity, Urine: 1.025 (ref 1.005–1.030)
pH: 6 (ref 5.0–8.0)

## 2019-07-30 LAB — LACTIC ACID, PLASMA: Lactic Acid, Venous: 1.7 mmol/L (ref 0.5–1.9)

## 2019-07-30 MED ORDER — IOHEXOL 350 MG/ML SOLN
75.0000 mL | Freq: Once | INTRAVENOUS | Status: AC | PRN
  Administered 2019-07-30: 75 mL via INTRAVENOUS

## 2019-07-30 MED ORDER — SODIUM CHLORIDE 0.9 % IV BOLUS
1000.0000 mL | Freq: Once | INTRAVENOUS | Status: AC
  Administered 2019-07-30: 1000 mL via INTRAVENOUS

## 2019-07-30 NOTE — ED Provider Notes (Signed)
[facility] Emergency Department Provider Note  Time seen: 12:09 PM  I have reviewed the triage vital signs and the nursing notes.   HISTORY  Chief Complaint Post-op Problem   HPI Hector [name] is a 58 y.o. male with a past medical history of anxiety, aortic aneurysm, prior PE, hypertension, hyperlipidemia, recent cervical spine operation 5 days ago presents to the emergency department for generalized fatigue weakness.  According to the wife on Monday the patient had a surgery, he was in quite a bit of pain and was requiring a good amount of pain medication as well as Valium.  Wife states on Thursday he began acting somewhat confused so she stopped the Valium and has cut back on the pain medication.  She states however the patient continues to be quite fatigued with generalized weakness.  He does admit he has not been eating or drinking much since the operation.  Denies any nausea vomiting or diarrhea.  Denies dysuria.  Denies cough or shortness of breath.  Does have a prior history of a PE 1 year ago is not currently anticoagulated but denies any chest pain.   Past Medical History:  Diagnosis Date  . Abnormal LFTs    improved after gastic bypass  . ADJ DISORDER WITH MIXED ANXIETY & DEPRESSED MOOD 08/23/2009   Qualifier: Diagnosis of  By: [name] MD, Standley Brooking Now on lamictal and lexapro and elevil for sleep.   [name] Anxiety   . Ascending aortic aneurysm ([place])    a. 06/2018 CT: 4.1cm.  . B12 nutritional deficiency   . Chest pain    a. 2007 MV: No ischemia; b. 09/2018 recurrent c/p in setting of PE; c. 09/2018 MV: No ischemia/infarct. EF 55-65%.  . Closed head injury    laceration  9 12  no loc  vision change  . DDD (degenerative disc disease)   . Diabetes mellitus    resolved with gastric bypass  . GERD (gastroesophageal reflux disease)    no meds  . Gout   . Headache    once per week uses Indomethicin hx of cluster headaches   . History of bunionectomy of left great  toe   . History of concussion   . History of echocardiogram    a. 09/2018 Echo: Ef 65-70%, no rwma, mild MR. Ao root 42mm (mildly dil).  . History of renal stone    Seen in emergency room not urologist  . Hyperlipidemia   . Hypertension 02/27/2017  . OBESITY 12/10/2007   a. s/p gastric bypass.  . OSA (obstructive sleep apnea) 12/12/2013  . Pancreatitis 03/21/2016   went to [facility]  . Perforated ulcer ([place])    Surgical repair9/24 2011  . Pulmonary embolism ([place])    a. 09/18/2018 CTA Chest: RLL segmental arterial branch small volume PE-->Eliquis.    Patient Active Problem List   Diagnosis Date Noted  . Osteoarthritis of knee 11/02/2018  . Chest pain 09/26/2018  . Pulmonary embolism ([place]) 09/24/2018  . Hypertension 02/27/2017  . Acute gastroenteritis 11/24/2016  . Sepsis ([place]) 11/24/2016  . AKI (acute kidney injury) ([place]) 11/24/2016  . Hypokalemia 11/24/2016  . Chronic low back pain 11/24/2016  . Pancreatitis 03/19/2016  . Anxiety 03/03/2016  . Nasal valve collapse 11/26/2015  . Perennial allergic rhinitis 11/26/2015  . Renal calculi   . 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 Type II or unspecified type diabetes mellitus without mention of complication, not stated as uncontrolled 05/15/2014  .  Lumbar back pain with radiculopathy affecting left lower extremity 05/15/2014  . Medication management 05/15/2014  . Pre-op evaluation 05/15/2014  . Insufficient treatment with nasal CPAP 04/24/2014  . 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 Hypoxemia 04/24/2014  . OSA (obstructive sleep apnea) 12/12/2013  . 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 History of renal stone   . Colon cancer screening 12/28/2012  . GERD (gastroesophageal reflux disease) 12/28/2012  . Visit for preventive health examination 09/06/2012  . History of gout 09/06/2012  . Easy bruising 03/24/2012  . Headaches due to old head injury 03/24/2012  .  Insomnia 03/24/2012  . BACK PAIN WITH RADICULOPATHY 10/07/2010  . Thrombocytopenia ([place]) 05/24/2010  . ADJ DISORDER WITH MIXED ANXIETY & DEPRESSED MOOD 08/23/2009  . BACK PAIN 10/20/2008  . UNS ADVRS EFF OTH RX MEDICINAL&BIOLOGICAL SBSTNC 02/04/2008  . GOUT 08/02/2007  . ASTHMA 07/22/2007  . B12 deficiency 04/06/2007  . CARPAL TUNNEL SYNDROME 04/06/2007    Past Surgical History:  Procedure Laterality Date  . BACK SURGERY  2012   Dr. Dorina Hoyer disk  . CARPAL TUNNEL RELEASE  2008   left-ulnar nerve decomp  . carpel tunnel Left   . CHOLECYSTECTOMY N/A 03/21/2016   Procedure: LAPAROSCOPIC CHOLECYSTECTOMY WITH INTRAOPERATIVE CHOLANGIOGRAM;  Surgeon: Ralene Ok, MD;  Location: [facility];  Service: General;  Laterality: N/A;  . colonscopy     . EPIDURAL BLOCK INJECTION     by Dr. Nelva Bush  . FOOT SURGERY     bilat for removal of extra bone   . GASTRIC BYPASS  11/06/09   beginning weight 267  . HARDWARE REMOVAL N/A 05/02/2016   Procedure: Removal of hardware Lumbar five-Sacral one with Metrex;  Surgeon: Kristeen Miss, MD;  Location: [facility];  Service: Neurosurgery;  Laterality: N/A;  Removal of hardware L5-S1 with Metrex  . HERNIA REPAIR  [id number]   umbilical  . HIATAL HERNIA REPAIR N/A 01/20/2013   Procedure: LAPAROSCOPIC REPAIR OF INTERNAL HERNIA;  Surgeon: Shann Medal, MD;  Location: [facility];  Service: General;  Laterality: N/A;  . LUMBAR LAMINECTOMY/DECOMPRESSION MICRODISCECTOMY Left 06/07/2014   Procedure: MICRO LUMBAR DECOMPRESSION L5 - S1 ON THE LEFT/L5 FORAMINOTOMY/EXCISION SYNOVIAL CYST  1 LEVEL;  Surgeon: [name], MD;  Location: [facility];  Service: Orthopedics;  Laterality: Left;  [name] MASS EXCISION Right 04/18/2013   Procedure: RIGHT SMALL FINGER SKIN LESION EXCISION;  Surgeon: Jolyn Nap, MD;  Location: [facility];  Service: Orthopedics;  Laterality: Right;  . NASAL SEPTOPLASTY W/ TURBINOPLASTY     x 2  . NEPHROLITHOTOMY Right 11/03/2014   Procedure: RIGHT  PERCUTANEOUS NEPHROLITHOTOMY;  Surgeon: Claybon Jabs, MD;  Location: [facility];  Service: Urology;  Laterality: Right;  . SHOULDER ARTHROSCOPY  2014   rt  . surgerical repair of stomach ulcer  06/08/10   per  . TONSILLECTOMY    . UVULOPALATOPHARYNGOPLASTY  2007   with tonsils  . VASECTOMY      Prior to Admission medications   Medication Sig Start Date End Date Taking? Authorizing Provider  pregabalin (LYRICA) 75 MG capsule Take 75 mg by mouth daily. 05/11/19  Yes [provider]  albuterol (PROVENTIL HFA;VENTOLIN HFA) 108 (90 Base) MCG/ACT inhaler Inhale 2 puffs into the lungs every 6 (six) hours as needed for wheezing or shortness of breath. 09/03/18   Caren Macadam, MD  allopurinol (ZYLOPRIM) 100 MG tablet Take 1 tablet (100 mg total) by mouth daily. 04/22/19   Regal,  Tamala Fothergill, DPM  Cholecalciferol (VITAMIN D-1000 MAX ST) 1000 units tablet Take 1,000 Units by mouth daily.    [provider]  colchicine 0.6 MG tablet TAKE 1 TABLET BY MOUTH 2 TIMES DAILY 05/27/19   Regal, Tamala Fothergill, DPM  diazepam (VALIUM) 5 MG tablet Take 5 mg by mouth every 4 (four) hours as needed. 07/25/19   [provider]  escitalopram (LEXAPRO) 20 MG tablet Take 20 mg by mouth every morning.     [provider]  indomethacin (INDOCIN) 50 MG capsule Take 1 capsule (50 mg total) by mouth 2 (two) times daily with a meal. 10/19/18   Regal, Tamala Fothergill, DPM  lamoTRIgine (LAMICTAL) 100 MG tablet Take 100 mg by mouth 2 (two) times daily.     [provider]  losartan-hydrochlorothiazide (HYZAAR) 50-12.5 MG tablet Take 1 tablet by mouth daily. 03/17/19   [provider]  Multiple Vitamin (MULTIVITAMIN) tablet Take 1 tablet by mouth daily.    [provider]  nitroGLYCERIN (NITROSTAT) 0.3 MG SL tablet Place 1 tablet (0.3 mg total) under the tongue every 5 (five) minutes as needed for chest pain. 09/27/18 09/27/19  Epifanio Lesches, MD  oxyCODONE-acetaminophen (PERCOCET)  10-325 MG tablet Take 1 tablet by mouth every 4 (four) hours as needed for pain. 04/27/19   Wallene Huh, DPM  Spacer/Aero-Holding Chambers (E-Z SPACER) inhaler Use as instructed 09/03/18   Caren Macadam, MD  traZODone (DESYREL) 100 MG tablet Take 300 mg by mouth at bedtime. 07/17/19   [provider]  vitamin B-12 (CYANOCOBALAMIN) 1000 MCG tablet Take 1,000 mcg by mouth daily.    [provider]    Allergies  Allergen Reactions  . Aspirin Other (See Comments)    perforated ulcer  . Cymbalta [Duloxetine Hcl] Other (See Comments)    Over-sedating; slept over 24 hours with one dose.  Possible serotonin syndrome.  . Tape Rash    Surgical tape  . Nsaids Other (See Comments)    ulcer Other reaction(s): Other (See Comments) Perforated ulcer  . Hydrocodone Other (See Comments)    headache  . Vancomycin Rash    Red man syndrome.  Please premedicate.    Family History  Problem Relation Age of Onset  . Uterine cancer Mother   . Hypertension Mother   . Cervical cancer Mother   . Heart disease Father   . COPD Father   . Seizures Father   . Colon cancer Maternal Uncle   . Colon cancer Paternal Aunt     Social History Social History   Tobacco Use  . Smoking status: Never Smoker  . Smokeless tobacco: Never Used  Substance Use Topics  . Alcohol use: Yes    Comment: occas liquor   . Drug use: No    Review of Systems Constitutional: Negative for fever.  Generalized weakness Cardiovascular: Negative for chest pain. Respiratory: Negative for shortness of breath. Gastrointestinal: Negative for abdominal pain, vomiting and diarrhea. Genitourinary: Negative for urinary compaints Musculoskeletal: Negative for musculoskeletal complaints Skin: Negative for skin complaints  Neurological: Negative for headache All other ROS negative  ____________________________________________   PHYSICAL EXAM:  VITAL SIGNS: ED Triage Vitals  Enc Vitals Group     BP  07/30/19 1126 101/75     Pulse Rate 07/30/19 1126 (!) 122     Resp 07/30/19 1126 18     Temp 07/30/19 1126 98.5 F (36.9 C)     Temp Source 07/30/19 1126 Oral     SpO2  07/30/19 1126 95 %     Weight 07/30/19 1127 175 lb (79.4 kg)     Height 07/30/19 1127 5\' 10"  (1.778 m)     Head Circumference --      Peak Flow --      Pain Score 07/30/19 1127 6     Pain Loc --      Pain Edu? --      Excl. in Colon? --    Constitutional: Alert and oriented. Well appearing and in no distress. Eyes: Normal exam ENT      Head: Normocephalic and atraumatic.      Mouth/Throat: Mucous membranes are moist. Cardiovascular: Normal rate, regular rhythm.  Respiratory: Normal respiratory effort without tachypnea nor retractions. Breath sounds are clear Gastrointestinal: Soft and nontender. No distention. Musculoskeletal: Nontender with normal range of motion in all extremities. Neurologic:  Normal speech and language. No gross focal neurologic deficits  Skin:  Skin is warm, dry and intact.  Psychiatric: Mood and affect are normal.  ____________________________________________    EKG  EKG viewed and interpreted by myself shows sinus tachycardia 124 bpm with a narrow QRS, left axis deviation, largely normal intervals with nonspecific but no concerning ST changes.  ____________________________________________    RADIOLOGY  CT neck shows postoperative changes thought to be within normal limits. X-ray of the chest is negative. CT of the chest is negative for acute abnormality.  ____________________________________________   INITIAL IMPRESSION / ASSESSMENT AND PLAN / ED COURSE  Pertinent labs & imaging results that were available during my care of the patient were reviewed by me and considered in my medical decision making (see chart for details).   Patient presents to the emergency department for symptoms of generalized fatigue and weakness.  Differential is quite broad but would include electrolyte or  metabolic abnormality, renal insufficiency, dehydration.  Patient has weakness throughout but no focal weakness identified, no significant difference in weakness pain upper and lower extremities, highly doubt postsurgical hematoma or spinal cord injury.  Very likely could be medication induced or infectious.  We will check a chest x-ray and urinalysis.  We will check basic lab work we will IV hydrate and continue to closely monitor.  Patient's work-up is largely within normal limits does have mild dehydration.  We will IV hydrate with a second liter of fluid.  Imaging is largely within normal limits/expected changes.  Patient is feeling better after fluids.  We will finish the second liter and discharged home.  I discussed with the patient to really increase fluids at home and try to limit pain medication use.  They are agreeable to plan of care.  Discussed return precautions.  Hector [name] was evaluated in Emergency Department on 07/30/2019 for the symptoms described in the history of present illness. He was evaluated in the context of the global COVID-19 pandemic, which necessitated consideration that the patient might be at risk for infection with the SARS-CoV-2 virus that causes COVID-19. Institutional protocols and algorithms that pertain to the evaluation of patients at risk for COVID-19 are in a state of rapid change based on information released by regulatory bodies including the CDC and federal and state organizations. These policies and algorithms were followed during the patient's care in the ED.  ____________________________________________   FINAL CLINICAL IMPRESSION(S) / ED DIAGNOSES  Weakness   Harvest Dark, MD 07/30/19 1547

## 2019-07-30 NOTE — ED Notes (Addendum)
Pt presents to the ED c/o weakness, sleepiness, and lethargy. Per pt's wife pt had surgery on Nov. 9th. Pt has been taking valium and oxycodone for pain control. Per wife she decreased the dose of oxy and valium but the pt began to get worse. On assessment, pt is lethargic and appears to be weak.

## 2019-07-30 NOTE — ED Triage Notes (Signed)
Patient presents to the ED from home after having surgery on his neck Nov. 9th.  Patient's wife states that for the past several days patient has grown increasingly weak and tired.  Patient's wife states this am, patient's pulse ox was consistently 90%.  Patient is complaining of pain in his joints.

## 2019-08-04 DIAGNOSIS — F4310 Post-traumatic stress disorder, unspecified: Secondary | ICD-10-CM | POA: Diagnosis not present

## 2019-08-04 DIAGNOSIS — F3132 Bipolar disorder, current episode depressed, moderate: Secondary | ICD-10-CM | POA: Diagnosis not present

## 2019-08-10 DIAGNOSIS — M5412 Radiculopathy, cervical region: Secondary | ICD-10-CM | POA: Diagnosis not present

## 2019-08-24 DIAGNOSIS — F4312 Post-traumatic stress disorder, chronic: Secondary | ICD-10-CM | POA: Diagnosis not present

## 2019-09-07 DIAGNOSIS — F4312 Post-traumatic stress disorder, chronic: Secondary | ICD-10-CM | POA: Diagnosis not present

## 2019-09-23 DIAGNOSIS — M5412 Radiculopathy, cervical region: Secondary | ICD-10-CM | POA: Diagnosis not present

## 2019-09-28 DIAGNOSIS — F4312 Post-traumatic stress disorder, chronic: Secondary | ICD-10-CM | POA: Diagnosis not present

## 2019-09-29 DIAGNOSIS — Z20822 Contact with and (suspected) exposure to covid-19: Secondary | ICD-10-CM | POA: Diagnosis not present

## 2019-09-29 DIAGNOSIS — R5383 Other fatigue: Secondary | ICD-10-CM | POA: Diagnosis not present

## 2019-10-04 DIAGNOSIS — R1013 Epigastric pain: Secondary | ICD-10-CM | POA: Diagnosis not present

## 2019-10-04 DIAGNOSIS — R5383 Other fatigue: Secondary | ICD-10-CM | POA: Diagnosis not present

## 2019-10-06 DIAGNOSIS — F4310 Post-traumatic stress disorder, unspecified: Secondary | ICD-10-CM | POA: Diagnosis not present

## 2019-10-06 DIAGNOSIS — F3132 Bipolar disorder, current episode depressed, moderate: Secondary | ICD-10-CM | POA: Diagnosis not present

## 2019-10-19 DIAGNOSIS — F4312 Post-traumatic stress disorder, chronic: Secondary | ICD-10-CM | POA: Diagnosis not present

## 2019-10-21 ENCOUNTER — Other Ambulatory Visit: Payer: Self-pay | Admitting: Neurological Surgery

## 2019-10-21 ENCOUNTER — Other Ambulatory Visit (HOSPITAL_COMMUNITY): Payer: Self-pay | Admitting: Neurological Surgery

## 2019-10-21 DIAGNOSIS — M5416 Radiculopathy, lumbar region: Secondary | ICD-10-CM

## 2019-10-21 DIAGNOSIS — M5414 Radiculopathy, thoracic region: Secondary | ICD-10-CM

## 2019-10-21 DIAGNOSIS — M5412 Radiculopathy, cervical region: Secondary | ICD-10-CM

## 2019-11-07 ENCOUNTER — Other Ambulatory Visit: Payer: Self-pay | Admitting: Gastroenterology

## 2019-11-07 DIAGNOSIS — R112 Nausea with vomiting, unspecified: Secondary | ICD-10-CM

## 2019-11-07 DIAGNOSIS — R1310 Dysphagia, unspecified: Secondary | ICD-10-CM

## 2019-11-07 DIAGNOSIS — R634 Abnormal weight loss: Secondary | ICD-10-CM

## 2019-11-07 DIAGNOSIS — K529 Noninfective gastroenteritis and colitis, unspecified: Secondary | ICD-10-CM | POA: Diagnosis not present

## 2019-11-07 DIAGNOSIS — R1013 Epigastric pain: Secondary | ICD-10-CM

## 2019-11-07 DIAGNOSIS — R6881 Early satiety: Secondary | ICD-10-CM

## 2019-11-07 DIAGNOSIS — R197 Diarrhea, unspecified: Secondary | ICD-10-CM | POA: Diagnosis not present

## 2019-11-09 ENCOUNTER — Ambulatory Visit (HOSPITAL_COMMUNITY)
Admission: RE | Admit: 2019-11-09 | Discharge: 2019-11-09 | Disposition: A | Discharge status: Home / Self Care | Payer: Federal, State, Local not specified - PPO | Source: Ambulatory Visit | Attending: Neurological Surgery | Admitting: Neurological Surgery

## 2019-11-09 ENCOUNTER — Other Ambulatory Visit (HOSPITAL_COMMUNITY): Payer: Federal, State, Local not specified - PPO

## 2019-11-09 ENCOUNTER — Other Ambulatory Visit: Payer: Self-pay

## 2019-11-09 ENCOUNTER — Ambulatory Visit (HOSPITAL_COMMUNITY): Payer: Federal, State, Local not specified - PPO

## 2019-11-09 DIAGNOSIS — G96191 Perineural cyst: Secondary | ICD-10-CM | POA: Insufficient documentation

## 2019-11-09 DIAGNOSIS — M5010 Cervical disc disorder with radiculopathy, unspecified cervical region: Secondary | ICD-10-CM | POA: Diagnosis not present

## 2019-11-09 DIAGNOSIS — M48061 Spinal stenosis, lumbar region without neurogenic claudication: Secondary | ICD-10-CM | POA: Insufficient documentation

## 2019-11-09 DIAGNOSIS — M5126 Other intervertebral disc displacement, lumbar region: Secondary | ICD-10-CM | POA: Diagnosis not present

## 2019-11-09 DIAGNOSIS — M5414 Radiculopathy, thoracic region: Secondary | ICD-10-CM

## 2019-11-09 DIAGNOSIS — M4712 Other spondylosis with myelopathy, cervical region: Secondary | ICD-10-CM | POA: Diagnosis not present

## 2019-11-09 DIAGNOSIS — M5416 Radiculopathy, lumbar region: Secondary | ICD-10-CM

## 2019-11-09 DIAGNOSIS — Z981 Arthrodesis status: Secondary | ICD-10-CM | POA: Insufficient documentation

## 2019-11-09 DIAGNOSIS — M4326 Fusion of spine, lumbar region: Secondary | ICD-10-CM | POA: Diagnosis not present

## 2019-11-09 DIAGNOSIS — M5412 Radiculopathy, cervical region: Secondary | ICD-10-CM

## 2019-11-09 DIAGNOSIS — M47816 Spondylosis without myelopathy or radiculopathy, lumbar region: Secondary | ICD-10-CM | POA: Insufficient documentation

## 2019-11-09 DIAGNOSIS — M4602 Spinal enthesopathy, cervical region: Secondary | ICD-10-CM | POA: Diagnosis not present

## 2019-11-09 DIAGNOSIS — M549 Dorsalgia, unspecified: Secondary | ICD-10-CM | POA: Diagnosis not present

## 2019-11-09 DIAGNOSIS — M4802 Spinal stenosis, cervical region: Secondary | ICD-10-CM | POA: Diagnosis not present

## 2019-11-09 MED ORDER — OXYCODONE-ACETAMINOPHEN 5-325 MG PO TABS
2.0000 | ORAL_TABLET | Freq: Once | ORAL | Status: AC
  Administered 2019-11-09: 2 via ORAL

## 2019-11-09 MED ORDER — IOHEXOL 300 MG/ML  SOLN
10.0000 mL | Freq: Once | INTRAMUSCULAR | Status: AC | PRN
  Administered 2019-11-09: 7 mL via INTRATHECAL

## 2019-11-09 MED ORDER — ONDANSETRON HCL 4 MG/2ML IJ SOLN: 4.0000 mg | Freq: Four times a day (QID) | INTRAMUSCULAR | Status: DC | PRN

## 2019-11-09 MED ORDER — DIAZEPAM 5 MG PO TABS
10.0000 mg | ORAL_TABLET | Freq: Once | ORAL | Status: AC
  Administered 2019-11-09: 10 mg via ORAL
  Filled 2019-11-09 (×2): qty 2

## 2019-11-09 MED ORDER — LIDOCAINE HCL (PF) 1 % IJ SOLN
5.0000 mL | Freq: Once | INTRAMUSCULAR | Status: AC
  Administered 2019-11-09: 5 mL via INTRADERMAL

## 2019-11-09 MED ORDER — HYDROCODONE-ACETAMINOPHEN 5-325 MG PO TABS
1.0000 | ORAL_TABLET | ORAL | Status: DC | PRN
  Filled 2019-11-09: qty 2

## 2019-11-09 MED ORDER — OXYCODONE-ACETAMINOPHEN 5-325 MG PO TABS
ORAL_TABLET | ORAL | Status: AC
  Filled 2019-11-09: qty 2

## 2019-11-09 NOTE — Discharge Instructions (Signed)
Myelogram and Lumbar Puncture Discharge Instructions ° °1. Go home and rest quietly for the next 24 hours.  It is important to lie flat for the next 24 hours.  Get up only to go to the restroom.  You may lie in the bed or on a couch on your back, your stomach, your left side or your right side.  You may have one pillow under your head.  You may have pillows between your knees while you are on your side or under your knees while you are on your back. ° °2. DO NOT drive today.  Recline the seat as far back as it will go, while still wearing your seat belt, on the way home. ° °3. You may get up to go to the bathroom as needed.  You may sit up for 10 minutes to eat.  You may resume your normal diet and medications unless otherwise indicated. ° °4. The incidence of headache, nausea, or vomiting is about 5% (one in 20 patients).  If you develop a headache, lie flat and drink plenty of fluids until the headache goes away.  Caffeinated beverages may be helpful.  If you develop severe nausea and vomiting or a headache that does not go away with flat bed rest, call Dr Elsner ° °5. You may resume normal activities after your 24 hours of bed rest is over; however, do not exert yourself strongly or do any heavy lifting tomorrow. ° °6. Call your physician for a follow-up appointment.  The results of your myelogram will be sent directly to your physician by the following day. ° °7. If you have any questions or if complications develop after you arrive home, please call Dr Elsner ° °Discharge instructions have been explained to the patient.  The patient, or the person responsible for the patient, fully understands these instructions. ° ° °

## 2019-11-09 NOTE — Progress Notes (Signed)
Okay to proceed with Myelogram per Emily/Dr.Elsner. PO Valium given and pt to Radiology at 0746.

## 2019-11-09 NOTE — Procedures (Signed)
Hector [name] is a 59 year old individuals had a two-level anterior decompression arthroplasty at C5-6 and C6-C7 he is had left cervical radiculopathy preoperatively had significant right cervical radiculopathy pain is been persisting and because of his persistent symptoms I advised a myelogram and post myelogram CAT scan patient also notes that he has had some symptoms in his thoracic spine and also in his lower extremities because of this we will do a total myelogram he had previous surgery in the lower lumbar spine.  His neurologic exam features normal strength but decreased reflexes in the biceps and triceps normal reflexes in the lower extremities slightly wide-based gait is been noted but no focal weakness or spasticity can be identified.  Pre op Dx: Cervical spondylosis with myelopathy history of arthroplasty C5-6 C6-7 Post op Dx: Same Procedure: Total myelogram Surgeon: Madelina Sanda Puncture level: L3-4 Fluid color: Clear colorless Injection: Iohexol 300, 7 mL Findings: Small root sleeve cysts in the cervical spine ample decompression in the lumbar spine history of previous posterior interbody fusion at L5-S1.  Further evaluation with CT scanning.

## 2019-11-09 NOTE — Progress Notes (Signed)
Client c/o pain; states allergic to hydrocodone; called Dr Clarice Pole office and left message for someone to return call

## 2019-11-09 NOTE — Progress Notes (Signed)
Pt reports taking Lexapro and Trazodone yesterday. Attempted to reach Dr. Ellene Route to inform but unable. Called Radiology to inform, awaiting response.

## 2019-11-11 DIAGNOSIS — M5412 Radiculopathy, cervical region: Secondary | ICD-10-CM | POA: Diagnosis not present

## 2019-11-11 DIAGNOSIS — S32009K Unspecified fracture of unspecified lumbar vertebra, subsequent encounter for fracture with nonunion: Secondary | ICD-10-CM | POA: Diagnosis not present

## 2019-11-11 DIAGNOSIS — F4312 Post-traumatic stress disorder, chronic: Secondary | ICD-10-CM | POA: Diagnosis not present

## 2019-11-15 ENCOUNTER — Other Ambulatory Visit: Payer: Self-pay

## 2019-11-15 ENCOUNTER — Ambulatory Visit
Admission: RE | Admit: 2019-11-15 | Discharge: 2019-11-15 | Disposition: A | Discharge status: Home / Self Care | Payer: Federal, State, Local not specified - PPO | Source: Ambulatory Visit | Attending: Gastroenterology | Admitting: Gastroenterology

## 2019-11-15 DIAGNOSIS — R6881 Early satiety: Secondary | ICD-10-CM | POA: Diagnosis not present

## 2019-11-15 DIAGNOSIS — R1310 Dysphagia, unspecified: Secondary | ICD-10-CM | POA: Diagnosis not present

## 2019-11-15 DIAGNOSIS — R1013 Epigastric pain: Secondary | ICD-10-CM

## 2019-11-15 DIAGNOSIS — K219 Gastro-esophageal reflux disease without esophagitis: Secondary | ICD-10-CM | POA: Diagnosis not present

## 2019-11-15 DIAGNOSIS — R112 Nausea with vomiting, unspecified: Secondary | ICD-10-CM | POA: Diagnosis not present

## 2019-11-15 DIAGNOSIS — R634 Abnormal weight loss: Secondary | ICD-10-CM | POA: Diagnosis not present

## 2019-11-30 DIAGNOSIS — F4312 Post-traumatic stress disorder, chronic: Secondary | ICD-10-CM | POA: Diagnosis not present

## 2019-12-01 DIAGNOSIS — F3132 Bipolar disorder, current episode depressed, moderate: Secondary | ICD-10-CM | POA: Diagnosis not present

## 2019-12-01 DIAGNOSIS — F4310 Post-traumatic stress disorder, unspecified: Secondary | ICD-10-CM | POA: Diagnosis not present

## 2019-12-07 DIAGNOSIS — Z9884 Bariatric surgery status: Secondary | ICD-10-CM | POA: Diagnosis not present

## 2019-12-07 DIAGNOSIS — G8929 Other chronic pain: Secondary | ICD-10-CM | POA: Diagnosis not present

## 2019-12-07 DIAGNOSIS — M542 Cervicalgia: Secondary | ICD-10-CM | POA: Diagnosis not present

## 2019-12-14 ENCOUNTER — Other Ambulatory Visit: Payer: Self-pay | Admitting: Podiatry

## 2019-12-21 DIAGNOSIS — F4312 Post-traumatic stress disorder, chronic: Secondary | ICD-10-CM | POA: Diagnosis not present

## 2020-01-11 DIAGNOSIS — F4312 Post-traumatic stress disorder, chronic: Secondary | ICD-10-CM | POA: Diagnosis not present

## 2020-01-12 DIAGNOSIS — M5136 Other intervertebral disc degeneration, lumbar region: Secondary | ICD-10-CM | POA: Diagnosis not present

## 2020-01-12 DIAGNOSIS — M545 Low back pain: Secondary | ICD-10-CM | POA: Diagnosis not present

## 2020-01-18 ENCOUNTER — Emergency Department (HOSPITAL_COMMUNITY): Payer: Federal, State, Local not specified - PPO

## 2020-01-18 ENCOUNTER — Emergency Department (HOSPITAL_COMMUNITY)
Admission: EM | Admit: 2020-01-18 | Discharge: 2020-01-18 | Disposition: A | Discharge status: Home / Self Care | Payer: Federal, State, Local not specified - PPO | Attending: Emergency Medicine | Admitting: Emergency Medicine

## 2020-01-18 DIAGNOSIS — W010XXA Fall on same level from slipping, tripping and stumbling without subsequent striking against object, initial encounter: Secondary | ICD-10-CM | POA: Diagnosis not present

## 2020-01-18 DIAGNOSIS — W19XXXA Unspecified fall, initial encounter: Secondary | ICD-10-CM

## 2020-01-18 DIAGNOSIS — Y998 Other external cause status: Secondary | ICD-10-CM | POA: Diagnosis not present

## 2020-01-18 DIAGNOSIS — G8929 Other chronic pain: Secondary | ICD-10-CM | POA: Diagnosis not present

## 2020-01-18 DIAGNOSIS — Y92018 Other place in single-family (private) house as the place of occurrence of the external cause: Secondary | ICD-10-CM | POA: Insufficient documentation

## 2020-01-18 DIAGNOSIS — I10 Essential (primary) hypertension: Secondary | ICD-10-CM | POA: Diagnosis not present

## 2020-01-18 DIAGNOSIS — R531 Weakness: Secondary | ICD-10-CM | POA: Insufficient documentation

## 2020-01-18 DIAGNOSIS — M545 Low back pain, unspecified: Secondary | ICD-10-CM

## 2020-01-18 DIAGNOSIS — Z79899 Other long term (current) drug therapy: Secondary | ICD-10-CM | POA: Diagnosis not present

## 2020-01-18 DIAGNOSIS — Y93K9 Activity, other involving animal care: Secondary | ICD-10-CM | POA: Diagnosis not present

## 2020-01-18 LAB — CBC WITH DIFFERENTIAL/PLATELET
Abs Immature Granulocytes: 0.01 10*3/uL (ref 0.00–0.07)
Basophils Absolute: 0 10*3/uL (ref 0.0–0.1)
Basophils Relative: 1 %
Eosinophils Absolute: 0 10*3/uL (ref 0.0–0.5)
Eosinophils Relative: 1 %
HCT: 37.7 % — ABNORMAL LOW (ref 39.0–52.0)
Hemoglobin: 12.4 g/dL — ABNORMAL LOW (ref 13.0–17.0)
Immature Granulocytes: 0 %
Lymphocytes Relative: 26 %
Lymphs Abs: 0.9 10*3/uL (ref 0.7–4.0)
MCH: 35.7 pg — ABNORMAL HIGH (ref 26.0–34.0)
MCHC: 32.9 g/dL (ref 30.0–36.0)
MCV: 108.6 fL — ABNORMAL HIGH (ref 80.0–100.0)
Monocytes Absolute: 0.3 10*3/uL (ref 0.1–1.0)
Monocytes Relative: 9 %
Neutro Abs: 2.1 10*3/uL (ref 1.7–7.7)
Neutrophils Relative %: 63 %
Platelets: 79 10*3/uL — ABNORMAL LOW (ref 150–400)
RBC: 3.47 MIL/uL — ABNORMAL LOW (ref 4.22–5.81)
RDW: 15.8 % — ABNORMAL HIGH (ref 11.5–15.5)
WBC: 3.3 10*3/uL — ABNORMAL LOW (ref 4.0–10.5)
nRBC: 0 % (ref 0.0–0.2)

## 2020-01-18 LAB — BASIC METABOLIC PANEL
Anion gap: 9 (ref 5–15)
BUN: 5 mg/dL — ABNORMAL LOW (ref 6–20)
CO2: 25 mmol/L (ref 22–32)
Calcium: 8.7 mg/dL — ABNORMAL LOW (ref 8.9–10.3)
Chloride: 104 mmol/L (ref 98–111)
Creatinine, Ser: 1.11 mg/dL (ref 0.61–1.24)
GFR calc Af Amer: >60 mL/min (ref >60)
GFR calc non Af Amer: >60 mL/min (ref >60)
Glucose, Bld: 113 mg/dL — ABNORMAL HIGH (ref 70–99)
Potassium: 4.6 mmol/L (ref 3.5–5.1)
Sodium: 138 mmol/L (ref 135–145)

## 2020-01-18 MED ORDER — DEXAMETHASONE SODIUM PHOSPHATE 10 MG/ML IJ SOLN
10.0000 mg | Freq: Once | INTRAMUSCULAR | Status: AC
  Administered 2020-01-18: 10 mg via INTRAVENOUS
  Filled 2020-01-18: qty 1

## 2020-01-18 MED ORDER — HYDROMORPHONE HCL 1 MG/ML IJ SOLN
1.0000 mg | Freq: Once | INTRAMUSCULAR | Status: AC
  Administered 2020-01-18: 17:00:00 1 mg via INTRAVENOUS
  Filled 2020-01-18: qty 1

## 2020-01-18 MED ORDER — LIDOCAINE 5 % EX PTCH
1.0000 | MEDICATED_PATCH | CUTANEOUS | Status: DC
  Administered 2020-01-18: 1 via TRANSDERMAL
  Filled 2020-01-18: qty 1

## 2020-01-18 MED ORDER — HYDROMORPHONE HCL 1 MG/ML IJ SOLN
2.0000 mg | Freq: Once | INTRAMUSCULAR | Status: AC
  Administered 2020-01-18: 2 mg via INTRAVENOUS
  Filled 2020-01-18: qty 2

## 2020-01-18 MED ORDER — OXYCODONE-ACETAMINOPHEN 5-325 MG PO TABS: 2.0000 | ORAL_TABLET | ORAL | 0 refills | Status: DC | PRN

## 2020-01-18 NOTE — ED Triage Notes (Signed)
Pt here after his legs gave out on him and he fell. Pt hit his L hip and head. No LOC, no blood thinners. Pt with hx of fusion and scheduled for MRI. Pt c/o bil back pain. No loss of bowel or bladder. States unable to bear weight due to pain.

## 2020-01-18 NOTE — Discharge Instructions (Addendum)
Please call Dr. Reather Littler office tomorrow for a follow up visit Take pain medicine as prescribed

## 2020-01-18 NOTE — ED Provider Notes (Signed)
[name] Provider Note   CSN: [id number] Arrival date & time: 01/18/20  1323     History Chief Complaint  Patient presents with  . Fall    TRI NOWLING is a 59 y.o. male with history of chronic back pain and multiple back surgeries who presents with a fall and back pain.  Patient states that he was letting his dog out this morning and fell off balance and then his legs gave out on him and he fell onto his buttocks and onto the left side.  He states that he did hit his head but denies loss of consciousness, headache, neck pain.  He has severe low back pain which radiates down both of his legs with associated tingling in the feet.  He had to crawl to get to his phone this morning to contact his wife.  Since then he is only been able to take a couple steps but otherwise not able to walk.  He denies saddle anesthesia, numbness in the extremities, urinary retention, loss of bowel or bladder function.  He called his orthospine Psychologist, sport and exercise Dr. [name] office and they recommended for him to come here. He's had a L4-L5 fusion in the past and the hardware was removed several years ago which he feels was a mistake. He has an MRI scheduled for May 11. He takes Percocet 10/325 as needed for pain - he tried this earlier today without relief.  HPI     Past Medical History:  Diagnosis Date  . Abnormal LFTs    improved after gastic bypass  . ADJ DISORDER WITH MIXED ANXIETY & DEPRESSED MOOD 08/23/2009   Qualifier: Diagnosis of  By: [name] MD, Standley Brooking Now on lamictal and lexapro and elevil for sleep.   [name] Anxiety   . Ascending aortic aneurysm ([place])    a. 06/2018 CT: 4.1cm.  . B12 nutritional deficiency   . Chest pain    a. 2007 MV: No ischemia; b. 09/2018 recurrent c/p in setting of PE; c. 09/2018 MV: No ischemia/infarct. EF 55-65%.  . Closed head injury    laceration  9 12  no loc  vision change  . DDD (degenerative disc disease)   . Diabetes mellitus    resolved  with gastric bypass  . GERD (gastroesophageal reflux disease)    no meds  . Gout   . Headache    once per week uses Indomethicin hx of cluster headaches   . History of bunionectomy of left great toe   . History of concussion   . History of echocardiogram    a. 09/2018 Echo: Ef 65-70%, no rwma, mild MR. Ao root 66mm (mildly dil).  . History of renal stone    Seen in emergency room not urologist  . Hyperlipidemia   . Hypertension 02/27/2017  . OBESITY 12/10/2007   a. s/p gastric bypass.  . OSA (obstructive sleep apnea) 12/12/2013  . Pancreatitis 03/21/2016   went to [place]  . Perforated ulcer ([place])    Surgical repair9/24 2011  . Pulmonary embolism ([place])    a. 09/18/2018 CTA Chest: RLL segmental arterial branch small volume PE-->Eliquis.    Patient Active Problem List   Diagnosis Date Noted  . Osteoarthritis of knee 11/02/2018  . Chest pain 09/26/2018  . Pulmonary embolism ([place]) 09/24/2018  . Hypertension 02/27/2017  . Acute gastroenteritis 11/24/2016  . Sepsis ([place]) 11/24/2016  . AKI (acute kidney injury) ([place]) 11/24/2016  . Hypokalemia 11/24/2016  . Chronic low back  pain 11/24/2016  . Pancreatitis 03/19/2016  . Anxiety 03/03/2016  . Nasal valve collapse 11/26/2015  . Perennial allergic rhinitis 11/26/2015  . Renal calculi   . 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 Type II or unspecified type diabetes mellitus without mention of complication, not stated as uncontrolled 05/15/2014  . Lumbar back pain with radiculopathy affecting left lower extremity 05/15/2014  . Medication management 05/15/2014  . Pre-op evaluation 05/15/2014  . Insufficient treatment with nasal CPAP 04/24/2014  . 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 Hypoxemia 04/24/2014  . OSA (obstructive sleep apnea) 12/12/2013  . 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 History of renal stone   . Colon cancer screening 12/28/2012  . GERD  (gastroesophageal reflux disease) 12/28/2012  . Visit for preventive health examination 09/06/2012  . History of gout 09/06/2012  . Easy bruising 03/24/2012  . Headaches due to old head injury 03/24/2012  . Insomnia 03/24/2012  . BACK PAIN WITH RADICULOPATHY 10/07/2010  . Thrombocytopenia ([place]) 05/24/2010  . ADJ DISORDER WITH MIXED ANXIETY & DEPRESSED MOOD 08/23/2009  . BACK PAIN 10/20/2008  . UNS ADVRS EFF OTH RX MEDICINAL&BIOLOGICAL SBSTNC 02/04/2008  . GOUT 08/02/2007  . ASTHMA 07/22/2007  . B12 deficiency 04/06/2007  . CARPAL TUNNEL SYNDROME 04/06/2007    Past Surgical History:  Procedure Laterality Date  . BACK SURGERY  2012   Dr. Dorina Hoyer disk  . CARPAL TUNNEL RELEASE  2008   left-ulnar nerve decomp  . carpel tunnel Left   . CHOLECYSTECTOMY N/A 03/21/2016   Procedure: LAPAROSCOPIC CHOLECYSTECTOMY WITH INTRAOPERATIVE CHOLANGIOGRAM;  Surgeon: Ralene Ok, MD;  Location: [facility];  Service: General;  Laterality: N/A;  . colonscopy     . EPIDURAL BLOCK INJECTION     by Dr. Nelva Bush  . FOOT SURGERY     bilat for removal of extra bone   . GASTRIC BYPASS  11/06/09   beginning weight 267  . HARDWARE REMOVAL N/A 05/02/2016   Procedure: Removal of hardware Lumbar five-Sacral one with Metrex;  Surgeon: Kristeen Miss, MD;  Location: [facility];  Service: Neurosurgery;  Laterality: N/A;  Removal of hardware L5-S1 with Metrex  . HERNIA REPAIR  [id number]   umbilical  . HIATAL HERNIA REPAIR N/A 01/20/2013   Procedure: LAPAROSCOPIC REPAIR OF INTERNAL HERNIA;  Surgeon: Shann Medal, MD;  Location: [facility];  Service: General;  Laterality: N/A;  . LUMBAR LAMINECTOMY/DECOMPRESSION MICRODISCECTOMY Left 06/07/2014   Procedure: MICRO LUMBAR DECOMPRESSION L5 - S1 ON THE LEFT/L5 FORAMINOTOMY/EXCISION SYNOVIAL CYST  1 LEVEL;  Surgeon: [name], MD;  Location: [facility];  Service: Orthopedics;  Laterality: Left;  [name] MASS EXCISION Right 04/18/2013   Procedure: RIGHT SMALL FINGER SKIN LESION EXCISION;   Surgeon: Jolyn Nap, MD;  Location: [facility];  Service: Orthopedics;  Laterality: Right;  . NASAL SEPTOPLASTY W/ TURBINOPLASTY     x 2  . NEPHROLITHOTOMY Right 11/03/2014   Procedure: RIGHT PERCUTANEOUS NEPHROLITHOTOMY;  Surgeon: Claybon Jabs, MD;  Location: [facility];  Service: Urology;  Laterality: Right;  . SHOULDER ARTHROSCOPY  2014   rt  . surgerical repair of stomach ulcer  06/08/10   per  . TONSILLECTOMY    . UVULOPALATOPHARYNGOPLASTY  2007   with tonsils  . VASECTOMY         Family History  Problem Relation Age of Onset  . Uterine cancer Mother   . Hypertension Mother   . Cervical cancer Mother   .  Heart disease Father   . COPD Father   . Seizures Father   . Colon cancer Maternal Uncle   . Colon cancer Paternal Aunt     Social History   Tobacco Use  . Smoking status: Never Smoker  . Smokeless tobacco: Never Used  Substance Use Topics  . Alcohol use: Yes    Comment: occas liquor   . Drug use: No    Home Medications Prior to Admission medications   Medication Sig Start Date End Date Taking? Authorizing Provider  acetaminophen (TYLENOL) 325 MG tablet Take 325 mg by mouth every 6 (six) hours as needed for moderate pain. Take with oxycodone 15 mg doses    [provider]  albuterol (PROVENTIL HFA;VENTOLIN HFA) 108 (90 Base) MCG/ACT inhaler Inhale 2 puffs into the lungs every 6 (six) hours as needed for wheezing or shortness of breath. 09/03/18   Caren Macadam, MD  allopurinol (ZYLOPRIM) 100 MG tablet TAKE ONE TABLET BY MOUTH DAILY 12/15/19   Wallene Huh, DPM  celecoxib (CELEBREX) 200 MG capsule Take 200 mg by mouth daily as needed for pain. 10/12/19   [provider]  Cholecalciferol (VITAMIN D-1000 MAX ST) 1000 units tablet Take 1,000 Units by mouth daily.    [provider]  colchicine 0.6 MG tablet TAKE 1 TABLET BY MOUTH 2 TIMES DAILY Patient taking differently: Take 0.6 mg by mouth 2 (two) times daily as  needed (gout flare).  05/27/19   Wallene Huh, DPM  escitalopram (LEXAPRO) 20 MG tablet Take 20 mg by mouth every morning.     [provider]  indomethacin (INDOCIN) 50 MG capsule Take 1 capsule (50 mg total) by mouth 2 (two) times daily with a meal. Patient taking differently: Take 50 mg by mouth 2 (two) times daily as needed (headaches).  10/19/18   Wallene Huh, DPM  lamoTRIgine (LAMICTAL) 100 MG tablet Take 150 mg by mouth 2 (two) times daily.     [provider]  losartan-hydrochlorothiazide (HYZAAR) 50-12.5 MG tablet Take 1 tablet by mouth daily. 03/17/19   [provider]  Multiple Vitamin (MULTIVITAMIN) tablet Take 1 tablet by mouth daily.    [provider]  nitroGLYCERIN (NITROSTAT) 0.3 MG SL tablet Place 1 tablet (0.3 mg total) under the tongue every 5 (five) minutes as needed for chest pain. 09/27/18 11/03/19  Epifanio Lesches, MD  omeprazole (PRILOSEC) 20 MG capsule Take 20 mg by mouth daily as needed for heartburn. 10/04/19   [provider]  oxyCODONE (ROXICODONE) 15 MG immediate release tablet Take 15 mg by mouth every 6 (six) hours as needed for pain.  10/21/19   [provider]  Spacer/Aero-Holding Chambers (E-Z SPACER) inhaler Use as instructed 09/03/18   Caren Macadam, MD  traZODone (DESYREL) 100 MG tablet Take 300 mg by mouth at bedtime. 07/17/19   [provider]  vitamin B-12 (CYANOCOBALAMIN) 1000 MCG tablet Take 1,000 mcg by mouth daily.    [provider]    Allergies    Aspirin, Cymbalta [duloxetine hcl], Tape, Nsaids, Hydrocodone, and Vancomycin  Review of Systems   Review of Systems  Constitutional: Negative for chills and fever.  Respiratory: Negative for shortness of breath.   Cardiovascular: Negative for chest pain.  Gastrointestinal: Negative for abdominal pain.  Musculoskeletal: Positive for back pain.  Neurological: Positive for weakness (legs). Negative for syncope,  light-headedness and numbness.       +tingling  All other systems reviewed and are negative.  Physical Exam Updated Vital Signs BP 110/82 (BP Location: Right Arm)   Pulse 92   Temp 99.3 F (37.4 C) (Oral)   Resp 14   Ht 5\' 10"  (1.778 m)   Wt 72.6 kg   SpO2 100%   BMI 22.96 kg/m   Physical Exam Vitals and nursing note reviewed.  Constitutional:      General: He is not in acute distress.    Appearance: Normal appearance. He is well-developed. He is not ill-appearing.  HENT:     Head: Normocephalic and atraumatic.  Eyes:     General: No scleral icterus.       Right eye: No discharge.        Left eye: No discharge.     Conjunctiva/sclera: Conjunctivae normal.     Pupils: Pupils are equal, round, and reactive to light.  Cardiovascular:     Rate and Rhythm: Normal rate and regular rhythm.  Pulmonary:     Effort: Pulmonary effort is normal. No respiratory distress.     Breath sounds: Normal breath sounds.  Abdominal:     General: There is no distension.     Palpations: Abdomen is soft.     Tenderness: There is no abdominal tenderness.  Musculoskeletal:     Cervical back: Normal range of motion.     Comments: Back: Inspection: No masses, deformity, or rash Palpation: Diffuse tenderness from the thoracic to lumbar sprine Strength: Difficult to assess due to pain. He able to slightly raise his legs off the bed.  Sensation: Intact sensation with light touch in lower extremities bilaterally Gait: Not tested   Skin:    General: Skin is warm and dry.  Neurological:     Mental Status: He is alert and oriented to person, place, and time.  Psychiatric:        Behavior: Behavior normal.     ED Results / Procedures / Treatments   Labs (all labs ordered are listed, but only abnormal results are displayed) Labs Reviewed  BASIC METABOLIC PANEL - Abnormal; Notable for the following components:      Result Value   Glucose, Bld 113 (*)    BUN 5 (*)    Calcium 8.7 (*)    All  other components within normal limits  CBC WITH DIFFERENTIAL/PLATELET - Abnormal; Notable for the following components:   WBC 3.3 (*)    RBC 3.47 (*)    Hemoglobin 12.4 (*)    HCT 37.7 (*)    MCV 108.6 (*)    MCH 35.7 (*)    RDW 15.8 (*)    Platelets 79 (*)    All other components within normal limits    EKG None  Radiology No results found.  Procedures Procedures (including critical care time)  Medications Ordered in ED Medications  lidocaine (LIDODERM) 5 % 1 patch (1 patch Transdermal Patch Applied 01/18/20 1915)  HYDROmorphone (DILAUDID) injection 1 mg (1 mg Intravenous Given 01/18/20 1728)  dexamethasone (DECADRON) injection 10 mg (10 mg Intravenous Given 01/18/20 1729)  HYDROmorphone (DILAUDID) injection 2 mg (2 mg Intravenous Given 01/18/20 1912)    ED Course  I have reviewed the triage vital signs and the nursing notes.  Pertinent labs & imaging results that were available during my care of the patient were reviewed by me and considered in my medical decision making (see chart for details).  59 year old male presents with severe low back pain after his legs gave out gave out on him today and he had a fall.  He states that this is the worst pain that he is ever had.  He is having difficulty walking secondary to pain.  His vital signs are normal.  He has diffuse low back tenderness.  He is able to minimally lift his legs up off the stretcher but is limited due to pain.  He has not had any saddle anesthesia, bowel or bladder incontinence, or urinary retention.  Will provide pain control and order MRI of the lumbar spine  MRI is overall reassuring.  Dr. Tonita Cong was consulted who reviewed the MRI as well.  He does not see anything that needs any acute intervention and advises symptomatic control and following up as an outpatient.  I discussed this with the patient and he is upset and frustrated but is agreeable to following up.  He was requesting a refill of pain medicine which was  provided.  MDM Rules/Calculators/A&P                       Final Clinical Impression(s) / ED Diagnoses Final diagnoses:  Fall, initial encounter  Chronic midline low back pain, unspecified whether sciatica present    Rx / DC Orders ED Discharge Orders    None       Recardo Evangelist, PA-C 01/18/20 2057    Isla Pence, MD 01/18/20 2107

## 2020-01-19 DIAGNOSIS — M545 Low back pain: Secondary | ICD-10-CM | POA: Diagnosis not present

## 2020-01-19 DIAGNOSIS — M5136 Other intervertebral disc degeneration, lumbar region: Secondary | ICD-10-CM | POA: Diagnosis not present

## 2020-01-24 DIAGNOSIS — Z01818 Encounter for other preprocedural examination: Secondary | ICD-10-CM | POA: Diagnosis not present

## 2020-01-25 DIAGNOSIS — M5416 Radiculopathy, lumbar region: Secondary | ICD-10-CM | POA: Diagnosis not present

## 2020-02-01 DIAGNOSIS — F4312 Post-traumatic stress disorder, chronic: Secondary | ICD-10-CM | POA: Diagnosis not present

## 2020-02-23 ENCOUNTER — Other Ambulatory Visit: Payer: Self-pay

## 2020-02-28 ENCOUNTER — Other Ambulatory Visit: Payer: Self-pay

## 2020-02-28 ENCOUNTER — Ambulatory Visit (INDEPENDENT_AMBULATORY_CARE_PROVIDER_SITE_OTHER): Payer: Federal, State, Local not specified - PPO

## 2020-02-28 ENCOUNTER — Encounter: Payer: Self-pay | Admitting: Podiatry

## 2020-02-28 ENCOUNTER — Ambulatory Visit: Payer: Federal, State, Local not specified - PPO | Admitting: Podiatry

## 2020-02-28 DIAGNOSIS — M7751 Other enthesopathy of right foot: Secondary | ICD-10-CM

## 2020-02-28 DIAGNOSIS — M10071 Idiopathic gout, right ankle and foot: Secondary | ICD-10-CM

## 2020-02-28 DIAGNOSIS — M1000 Idiopathic gout, unspecified site: Secondary | ICD-10-CM

## 2020-02-28 MED ORDER — COLCHICINE 0.6 MG PO TABS: 0.6000 mg | ORAL_TABLET | Freq: Two times a day (BID) | ORAL | 1 refills | Status: DC

## 2020-02-29 ENCOUNTER — Encounter: Payer: Self-pay | Admitting: Podiatry

## 2020-02-29 NOTE — Progress Notes (Signed)
Abstract done for upcoming [facility] appt with Dr Donnetta Hutching. 03/29/20  [name] Mindi Junker

## 2020-02-29 NOTE — Progress Notes (Signed)
Subjective:  Patient ID: [name], male    DOB: [birth year],  MRN: [id number]  Chief Complaint  Patient presents with  . Ankle Pain    Patient presents today for gout flare up right ankle x 2-3 days.  He says it is now very painful to walk, touch and is swollen and red"      59 y.o. male presents with the above complaint.  Patient presents with right ankle gout flare.  Patient states it feels very similar to his big toe joint with that he gets on the other foot.  He states that this came out of nowhere.  His diet have been uncontrolled with red meat.  He states that his red hot swollen.  He has not had a ankle flareup before.  Patient has tried colchicine however he did not start taking it until after his flareup.  He is playing a catch up with it.  He would like a refill on colchicine as well.  He denies any other acute complaint.   Review of Systems: Negative except as noted in the HPI. Denies N/V/F/Ch.  Past Medical History:  Diagnosis Date  . Abnormal LFTs    improved after gastic bypass  . ADJ DISORDER WITH MIXED ANXIETY & DEPRESSED MOOD 08/23/2009   Qualifier: Diagnosis of  By: [name] MD, Standley Brooking Now on lamictal and lexapro and elevil for sleep.   [name] Anxiety   . Ascending aortic aneurysm ([place])    a. 06/2018 CT: 4.1cm.  . B12 nutritional deficiency   . Chest pain    a. 2007 MV: No ischemia; b. 09/2018 recurrent c/p in setting of PE; c. 09/2018 MV: No ischemia/infarct. EF 55-65%.  . Closed head injury    laceration  9 12  no loc  vision change  . DDD (degenerative disc disease)   . Diabetes mellitus    resolved with gastric bypass  . GERD (gastroesophageal reflux disease)    no meds  . Gout   . Headache    once per week uses Indomethicin hx of cluster headaches   . History of bunionectomy of left great toe   . History of concussion   . History of echocardiogram    a. 09/2018 Echo: Ef 65-70%, no rwma, mild MR. Ao root 35mm (mildly dil).  . History of renal stone    Seen in  emergency room not urologist  . Hyperlipidemia   . Hypertension 02/27/2017  . OBESITY 12/10/2007   a. s/p gastric bypass.  . OSA (obstructive sleep apnea) 12/12/2013  . Pancreatitis 03/21/2016   went to [place]  . Perforated ulcer ([place])    Surgical repair9/24 2011  . Pulmonary embolism ([place])    a. 09/18/2018 CTA Chest: RLL segmental arterial branch small volume PE-->Eliquis.    Current Outpatient Medications:  .  acetaminophen (TYLENOL) 325 MG tablet, Take 325 mg by mouth every 6 (six) hours as needed for moderate pain. Take with oxycodone 15 mg doses, Disp: , Rfl:  .  albuterol (PROVENTIL HFA;VENTOLIN HFA) 108 (90 Base) MCG/ACT inhaler, Inhale 2 puffs into the lungs every 6 (six) hours as needed for wheezing or shortness of breath., Disp: 1 Inhaler, Rfl: 0 .  allopurinol (ZYLOPRIM) 100 MG tablet, TAKE ONE TABLET BY MOUTH DAILY, Disp: 30 tablet, Rfl: 5 .  celecoxib (CELEBREX) 200 MG capsule, Take 200 mg by mouth daily as needed for pain., Disp: , Rfl:  .  Cholecalciferol (VITAMIN D-1000 MAX ST) 1000 units tablet, Take 1,000 Units  by mouth daily., Disp: , Rfl:  .  colchicine 0.6 MG tablet, Take 1 tablet (0.6 mg total) by mouth 2 (two) times daily., Disp: 30 tablet, Rfl: 1 .  escitalopram (LEXAPRO) 20 MG tablet, Take 20 mg by mouth every morning. , Disp: , Rfl:  .  HYDROmorphone (DILAUDID) 8 MG tablet, , Disp: , Rfl:  .  indomethacin (INDOCIN) 50 MG capsule, Take 1 capsule (50 mg total) by mouth 2 (two) times daily with a meal. (Patient taking differently: Take 50 mg by mouth 2 (two) times daily as needed (headaches). ), Disp: 60 capsule, Rfl: 1 .  lamoTRIgine (LAMICTAL) 100 MG tablet, Take 150 mg by mouth 2 (two) times daily. , Disp: , Rfl:  .  losartan-hydrochlorothiazide (HYZAAR) 50-12.5 MG tablet, Take 1 tablet by mouth daily., Disp: , Rfl:  .  methocarbamol (ROBAXIN) 500 MG tablet, methocarbamol 500 mg tablet, Disp: , Rfl:  .  Multiple Vitamin (MULTIVITAMIN) tablet, Take 1 tablet by  mouth daily., Disp: , Rfl:  .  nitroGLYCERIN (NITROSTAT) 0.3 MG SL tablet, Place 1 tablet (0.3 mg total) under the tongue every 5 (five) minutes as needed for chest pain., Disp: 100 tablet, Rfl: 3 .  omeprazole (PRILOSEC) 20 MG capsule, Take 20 mg by mouth daily as needed for heartburn., Disp: , Rfl:  .  pregabalin (LYRICA) 75 MG capsule, pregabalin 75 mg capsule, Disp: , Rfl:  .  Spacer/Aero-Holding Chambers (E-Z SPACER) inhaler, Use as instructed, Disp: 1 each, Rfl: 2 .  sucralfate (CARAFATE) 1 g tablet, Take 1 g by mouth 4 (four) times daily., Disp: , Rfl:  .  traZODone (DESYREL) 100 MG tablet, Take 300 mg by mouth at bedtime., Disp: , Rfl:  .  vitamin B-12 (CYANOCOBALAMIN) 1000 MCG tablet, Take 1,000 mcg by mouth daily., Disp: , Rfl:   Current Facility-Administered Medications:  .  indomethacin (INDOCIN) capsule 50 mg, 50 mg, Oral, TID PRN, Wallene Huh, DPM  Social History   Tobacco Use  Smoking Status Never Smoker  Smokeless Tobacco Never Used    Allergies  Allergen Reactions  . Aspirin Other (See Comments)    perforated ulcer  . Cymbalta [Duloxetine Hcl] Other (See Comments)    Over-sedating; slept over 24 hours with one dose.  Possible serotonin syndrome.  . Tape Rash    Surgical tape  . Nsaids Other (See Comments)    Perforated ulcer  . Hydrocodone Other (See Comments)    headache  . Vancomycin Rash    Red man syndrome.  Please premedicate.   Objective:  There were no vitals filed for this visit. There is no height or weight on file to calculate BMI. Constitutional Well developed. Well nourished.  Vascular Dorsalis pedis pulses palpable bilaterally. Posterior tibial pulses palpable bilaterally. Capillary refill normal to all digits.  No cyanosis or clubbing noted. Pedal hair growth normal.  Neurologic Normal speech. Oriented to person, place, and time. Epicritic sensation to light touch grossly present bilaterally.  Dermatologic Nails well groomed and normal  in appearance. No open wounds. No skin lesions.  Orthopedic:  Pain with ankle dorsiflexion and plantarflexion deep intra-articular pain noted.  Pain consistent with active and passive range of motion.  Pain with palpation at the ankle joint.  Red hot swollen joint noted.   Radiographs: 3 views of skeletally mature adult right ankle.  Previous hardware noted appears to be intact.  No arthritic changes noted at the ankle joint.  No extra-articular destruction noted.  No other signs of gout  noted.  Ankle mortise and within normal limits.  Assessment:   1. Acute idiopathic gout of right ankle   2. Capsulitis of right ankle    Plan:  Patient was evaluated and treated and all questions answered.  Right ankle gout acute -I explained patient the etiology of gout and various treatment options were discussed.  I discussed with the patient the importance of diet management in order to avoid flareups.  I also discussed with the patient the importance of colchicine and taking it as soon as the flareup starts.  Patient states understanding and will make adjustments to his diet as well as stay on top of the colchicine medication. -Patient will benefit from a steroid injection to help decrease the acute flare.  And the pain associated with it.  Patient agrees with plan would like to get a steroid injection. -A steroid injection was performed at right ankle joint using 1% plain Lidocaine and 10 mg of Kenalog. This was well tolerated. -Colchicine was refilled  No follow-ups on file.

## 2020-03-01 ENCOUNTER — Ambulatory Visit: Payer: Federal, State, Local not specified - PPO | Admitting: Podiatry

## 2020-03-08 DIAGNOSIS — E119 Type 2 diabetes mellitus without complications: Secondary | ICD-10-CM | POA: Diagnosis not present

## 2020-03-08 DIAGNOSIS — I10 Essential (primary) hypertension: Secondary | ICD-10-CM | POA: Diagnosis not present

## 2020-03-08 DIAGNOSIS — M5442 Lumbago with sciatica, left side: Secondary | ICD-10-CM | POA: Diagnosis not present

## 2020-03-08 DIAGNOSIS — E538 Deficiency of other specified B group vitamins: Secondary | ICD-10-CM | POA: Diagnosis not present

## 2020-03-16 ENCOUNTER — Ambulatory Visit: Payer: Self-pay | Admitting: Orthopedic Surgery

## 2020-03-16 DIAGNOSIS — M545 Low back pain: Secondary | ICD-10-CM | POA: Diagnosis not present

## 2020-03-16 NOTE — H&P (Signed)
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 His pain is rather severe and he is currently in pain management with Hector Code PA-C on Dilaudid. Given his significant pain, he is eager to move forward for surgical intervention. The patient is here today for a pre-operative History and Physical. They are scheduled for ALIF L4-5, PSFI on 03/29/20 with Dr. Rolena Infante at [facility].  Patient Active Problem List   Diagnosis Date Noted  . Osteoarthritis of knee 11/02/2018  . Chest pain 09/26/2018  . Pulmonary embolism ([place]) 09/24/2018  . Hypertension 02/27/2017  . Acute gastroenteritis 11/24/2016  . Sepsis ([place]) 11/24/2016  . AKI (acute kidney injury) ([place]) 11/24/2016  . Hypokalemia 11/24/2016  . Chronic low back pain 11/24/2016  . Pancreatitis 03/19/2016  . Anxiety 03/03/2016  . Nasal valve collapse 11/26/2015  . Perennial allergic rhinitis 11/26/2015  . Renal calculi   . 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 Type II or unspecified type diabetes mellitus without mention of complication, not stated as uncontrolled 05/15/2014  . Lumbar back pain with radiculopathy affecting left lower extremity 05/15/2014  . Medication management 05/15/2014  . Pre-op evaluation 05/15/2014  . Insufficient  treatment with nasal CPAP 04/24/2014  . 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 Hypoxemia 04/24/2014  . OSA (obstructive sleep apnea) 12/12/2013  . 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 History of renal stone   . Colon cancer screening 12/28/2012  . GERD (gastroesophageal reflux disease) 12/28/2012  . Visit for preventive health examination 09/06/2012  . History of gout 09/06/2012  . Easy bruising 03/24/2012  . Headaches due to old head injury 03/24/2012  . Insomnia 03/24/2012  . BACK PAIN WITH RADICULOPATHY 10/07/2010  . Thrombocytopenia ([place]) 05/24/2010  . ADJ DISORDER WITH MIXED ANXIETY & DEPRESSED MOOD 08/23/2009  . BACK PAIN 10/20/2008  . UNS ADVRS EFF OTH RX MEDICINAL&BIOLOGICAL SBSTNC 02/04/2008  . GOUT 08/02/2007  . ASTHMA 07/22/2007  . B12 deficiency 04/06/2007  . CARPAL TUNNEL SYNDROME 04/06/2007   Past Medical History:  Diagnosis Date  . Abnormal LFTs    improved after gastic bypass  . ADJ DISORDER WITH MIXED ANXIETY & DEPRESSED MOOD 08/23/2009   Qualifier: Diagnosis of  By: [name] MD, Standley Brooking Now on lamictal and lexapro and elevil for sleep.   [name] Anxiety   . Ascending aortic aneurysm ([place])    a. 06/2018 CT: 4.1cm.  . B12 nutritional deficiency   . Chest pain    a. 2007 MV: No ischemia; b. 09/2018 recurrent c/p in setting of PE; c. 09/2018 MV: No ischemia/infarct. EF 55-65%.  . Closed head injury    laceration  9 12  no loc  vision change  . DDD (degenerative disc disease)   . Diabetes mellitus    resolved with gastric bypass  . GERD (gastroesophageal reflux disease)    no meds  . Gout   .  Headache    once per week uses Indomethicin hx of cluster headaches   . History of bunionectomy of left great toe   . History of concussion   . History of echocardiogram    a. 09/2018 Echo: Ef 65-70%, no rwma, mild MR. Ao root 36mm (mildly dil).  . History of renal stone    Seen in emergency room not urologist  . Hyperlipidemia   . Hypertension  02/27/2017  . Nonspecific pain in the lumbar region   . OBESITY 12/10/2007   a. s/p gastric bypass.  . OSA (obstructive sleep apnea) 12/12/2013  . Osteoarthritis of knee   . Pain in left knee   . Pancreatitis 03/21/2016   went to [place]  . Perforated ulcer ([place])    Surgical repair9/24 2011  . Pulmonary embolism ([place])    a. 09/18/2018 CTA Chest: RLL segmental arterial branch small volume PE-->Eliquis.    Past Surgical History:  Procedure Laterality Date  . BACK SURGERY  2012   Dr. Dorina Hoyer disk  . CARPAL TUNNEL RELEASE  2008   left-ulnar nerve decomp  . carpel tunnel Left   . CHOLECYSTECTOMY N/A 03/21/2016   Procedure: LAPAROSCOPIC CHOLECYSTECTOMY WITH INTRAOPERATIVE CHOLANGIOGRAM;  Surgeon: Ralene Ok, MD;  Location: [facility];  Service: General;  Laterality: N/A;  . colonscopy     . EPIDURAL BLOCK INJECTION     by Dr. Nelva Bush  . FOOT SURGERY     bilat for removal of extra bone   . GASTRIC BYPASS  11/06/09   beginning weight 267  . HARDWARE REMOVAL N/A 05/02/2016   Procedure: Removal of hardware Lumbar five-Sacral one with Metrex;  Surgeon: Kristeen Miss, MD;  Location: [facility];  Service: Neurosurgery;  Laterality: N/A;  Removal of hardware L5-S1 with Metrex  . HERNIA REPAIR  [id number]   umbilical  . HIATAL HERNIA REPAIR N/A 01/20/2013   Procedure: LAPAROSCOPIC REPAIR OF INTERNAL HERNIA;  Surgeon: Shann Medal, MD;  Location: [facility];  Service: General;  Laterality: N/A;  . LUMBAR LAMINECTOMY/DECOMPRESSION MICRODISCECTOMY Left 06/07/2014   Procedure: MICRO LUMBAR DECOMPRESSION L5 - S1 ON THE LEFT/L5 FORAMINOTOMY/EXCISION SYNOVIAL CYST  1 LEVEL;  Surgeon: [name], MD;  Location: [facility];  Service: Orthopedics;  Laterality: Left;  [name] MASS EXCISION Right 04/18/2013   Procedure: RIGHT SMALL FINGER SKIN LESION EXCISION;  Surgeon: Jolyn Nap, MD;  Location: [facility];  Service: Orthopedics;  Laterality: Right;  . NASAL SEPTOPLASTY W/ TURBINOPLASTY     x 2  .  NEPHROLITHOTOMY Right 11/03/2014   Procedure: RIGHT PERCUTANEOUS NEPHROLITHOTOMY;  Surgeon: Claybon Jabs, MD;  Location: [facility];  Service: Urology;  Laterality: Right;  . SHOULDER ARTHROSCOPY  2014   rt  . surgerical repair of stomach ulcer  06/08/10   per  . TONSILLECTOMY    . UVULOPALATOPHARYNGOPLASTY  2007   with tonsils  . VASECTOMY      Current Outpatient Medications  Medication Sig Dispense Refill Last Dose  . acetaminophen (TYLENOL) 325 MG tablet Take 325 mg by mouth every 6 (six) hours as needed for moderate pain. Take with oxycodone 15 mg doses     . albuterol (PROVENTIL HFA;VENTOLIN HFA) 108 (90 Base) MCG/ACT inhaler Inhale 2 puffs into the lungs every 6 (six) hours as needed for wheezing or shortness of breath. 1 Inhaler 0   . allopurinol (ZYLOPRIM) 100 MG tablet TAKE ONE TABLET BY MOUTH DAILY (Patient taking differently: Take 100 mg by mouth daily. ) 30  tablet 5   . celecoxib (CELEBREX) 200 MG capsule Take 200 mg by mouth daily as needed for pain.     . Cholecalciferol (VITAMIN D-1000 MAX ST) 1000 units tablet Take 1,000 Units by mouth daily.     . colchicine 0.6 MG tablet Take 1 tablet (0.6 mg total) by mouth 2 (two) times daily. (Patient taking differently: Take 0.6 mg by mouth daily as needed (gout). ) 30 tablet 1   . diazepam (VALIUM) 5 MG tablet Take 5 mg by mouth every 6 (six) hours as needed for anxiety.     [name] escitalopram (LEXAPRO) 20 MG tablet Take 20 mg by mouth every morning.      [name] HYDROmorphone (DILAUDID) 8 MG tablet Take 8 mg by mouth 4 (four) times daily as needed for severe pain.     . indomethacin (INDOCIN) 50 MG capsule Take 1 capsule (50 mg total) by mouth 2 (two) times daily with a meal. (Patient taking differently: Take 50 mg by mouth 2 (two) times daily as needed (headaches). ) 60 capsule 1   . lamoTRIgine (LAMICTAL) 100 MG tablet Take 150 mg by mouth 2 (two) times daily.      [name] lisinopril (ZESTRIL) 10 MG tablet Take 10 mg by mouth daily. (Patient not  taking: Reported on 03/13/2020)     . lisinopril-hydrochlorothiazide (ZESTORETIC) 20-25 MG tablet Take 1 tablet by mouth daily. (Patient not taking: Reported on 03/13/2020)     . losartan-hydrochlorothiazide (HYZAAR) 50-12.5 MG tablet Take 1 tablet by mouth daily.     . methocarbamol (ROBAXIN) 500 MG tablet Take 500 mg by mouth every 8 (eight) hours as needed for muscle spasms.      . Multiple Vitamin (MULTIVITAMIN) tablet Take 1 tablet by mouth daily.     . nitroGLYCERIN (NITROSTAT) 0.3 MG SL tablet Place 1 tablet (0.3 mg total) under the tongue every 5 (five) minutes as needed for chest pain. 100 tablet 3   . omeprazole (PRILOSEC) 20 MG capsule Take 20 mg by mouth daily as needed for heartburn.     [name] oxyCODONE-acetaminophen (PERCOCET/ROXICET) 5-325 MG tablet Take by mouth every 4 (four) hours as needed for severe pain.     . pregabalin (LYRICA) 75 MG capsule Take 75 mg by mouth daily.      [name] Spacer/Aero-Holding Chambers (E-Z SPACER) inhaler Use as instructed 1 each 2   . sucralfate (CARAFATE) 1 g tablet Take 1 g by mouth 2 (two) times daily.      . tamsulosin (FLOMAX) 0.4 MG CAPS capsule Take 0.4 mg by mouth daily as needed (kidney stones).      . traZODone (DESYREL) 100 MG tablet Take 300 mg by mouth at bedtime.     . vitamin B-12 (CYANOCOBALAMIN) 1000 MCG tablet Take 1,000 mcg by mouth daily.      Current Facility-Administered Medications  Medication Dose Route Frequency Provider Last Rate Last Admin  . indomethacin (INDOCIN) capsule 50 mg  50 mg Oral TID PRN Wallene Huh, DPM       Allergies  Allergen Reactions  . Aspirin Other (See Comments)    perforated ulcer  . Cymbalta [Duloxetine Hcl] Other (See Comments)    Over-sedating; slept over 24 hours with one dose.  Possible serotonin syndrome.  . Tape Rash    Surgical tape  . Nsaids Other (See Comments)    Perforated ulcer  . Hydrocodone Other (See Comments)    headache  . Vancomycin Rash    Red man syndrome.  Please premedicate.     Social History   Tobacco Use  . Smoking status: Never Smoker  . Smokeless tobacco: Never Used  Substance Use Topics  . Alcohol use: Yes    Comment: occas liquor     Family History  Problem Relation Age of Onset  . Uterine cancer Mother   . Hypertension Mother   . Cervical cancer Mother   . Heart disease Father   . COPD Father   . Seizures Father   . Arthritis Father   . Arthritis Sister   . Diabetes Sister   . Colon cancer Maternal Uncle   . Colon cancer Paternal Aunt   . Diabetes Paternal Aunt   . Heart disease Maternal Grandmother   . Heart disease Maternal Grandfather   . Heart disease Paternal Grandmother   . Heart disease Paternal Grandfather   . Diabetes Paternal Uncle     Review of Systems As stated in HPI  Objective:   Vitals: Ht: 5 ft 10 in 03/16/2020 10:26 am Wt: 166.5 lbs With clothes 03/16/2020 10:29 am BMI: 23.9 03/16/2020 10:29 am BP: 113/78 sitting 03/16/2020 10:29 am Pulse: 72 bpm 03/16/2020 10:30 am Pain Scale: 5 03/16/2020 10:30 am Pain Scale Type: Numeric 03/16/2020 10:30 am  General: AAOX3, well developed and well nourished, NAD Ambulation: antalgic gait pattern, uses no assistive device. Inspection: No obvious deformity, scoleosis, kyphosis, loss of lordotic curve. Palpation: Non-tender over spinous processes and paraspinal muscles. AROM: - Significant pain with range of motion of lumbar spine - Knee: flexion and extension normal and pain free bilaterally. - Ankle: Dorsiflexion, plantarflexion, inversion, eversion normal and pain free. Dermatomes: Lower extremity sensation to light touch intact bilaterally Patient describing dysesthesias at L5/S1 dermatome pattern. Left side worse than right.  Myotomes: - Hip Flexion: Left 5/5, Right 5/5 - Knee Extension: Left 5/5, Right 5/5 - Ankle Dorsiflextion: Left 5/5, Right 5/5 - Ankle Plantarflexion: Left 5/5, Right 5/5 Reflexes: - Patella: Left2+, Right 2+ - Achilles: Left2+, Right 2+ -  Babinski: Left Ngative, Right Negative - Clonus: Negative Special Tests: - Straight Leg Raise: Left Positive, Right Positive  PV: Extremities warm and well profused. 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 There is no significant effusion in the intervertebral space, or posterior lateral gutter. Posterior pedicle screws have been removed. At this point time the patient has continuous debilitating pain primarily in the lumbar spine. No other significant pathology is noted either on the recent CT scan or MRI scan.  Assessment:   Hector [name] is a very pleasant 59 year old gentleman who had a previous PLIF at L4-5 several years ago. He states he has had persistent low back pain with mild neuropathic leg pain. 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 There is no significant effusion in the intervertebral space, or posterior lateral gutter. Posterior pedicle screws have been removed. At this point time the patient has continuous debilitating pain primarily in the lumbar spine. No other significant pathology is noted either on the recent CT scan or MRI scan.  Plan:   Hector [name] is a very pleasant 59 year old gentleman who had a previous PLIF at L4-5 several years ago. He states he has had persistent low back pain with mild neuropathic leg pain. 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 There is no significant effusion in the intervertebral space, or posterior lateral gutter. Posterior pedicle screws have been removed. At this point time the patient has continuous debilitating  pain primarily in the lumbar spine. No other significant pathology is noted either on the recent CT scan or MRI scan.

## 2020-03-20 ENCOUNTER — Ambulatory Visit: Payer: Federal, State, Local not specified - PPO | Admitting: Vascular Surgery

## 2020-03-20 ENCOUNTER — Encounter: Payer: Self-pay | Admitting: Vascular Surgery

## 2020-03-20 ENCOUNTER — Other Ambulatory Visit: Payer: Self-pay

## 2020-03-20 VITALS — BP 80/51 | HR 94 | Temp 98.1°F | Resp 20 | Ht 70.0 in | Wt 158.0 lb

## 2020-03-20 DIAGNOSIS — M5136 Other intervertebral disc degeneration, lumbar region: Secondary | ICD-10-CM | POA: Diagnosis not present

## 2020-03-20 NOTE — Progress Notes (Signed)
[facility]ist of [place]  Patient name: Hector [name] MRN: [id number] DOB: 1960/12/23 Sex: male  REASON FOR CONSULT: Discuss access for L4-5 interbody fusion with Dr. Rolena Infante  HPI: Hector [name] is a 59 y.o. male, who is here today for discussion of my role in anterior exposure for L4-5 fusion.  He has a very complicated past history.  He underwent prior instrumentation and fusion from the posterior aspect.  He does not recall the exact date.  He had complications with this and had removal of the hardware.  He has had continued difficulty with a severe back pain which is constant regardless of positioning.  He does have some symptoms that he feels is related to peripheral neuropathy.  He saw Dr. Rolena Infante for evaluation and he is recommended anterior interbody fusion and posterior instrumentation.  He is here today for discussion of my role in anterior exposure.  He has had a multitude of prior abdominal surgeries.  He had a repair of umbilical hernia with mesh with a relatively large transverse incision through the umbilicus and thinks that this was around 2008.  He underwent bariatric surgery for obesity and 2011.  He suffered a complication 6 months later with perforated ulcer and had surgical repair of this.  He reports that he has had 2 small bowel obstructions since that time both treated with a laparoscopic surgery.  Past Medical History:  Diagnosis Date  . Abnormal LFTs    improved after gastic bypass  . ADJ DISORDER WITH MIXED ANXIETY & DEPRESSED MOOD 08/23/2009   Qualifier: Diagnosis of  By: [name] MD, Standley Brooking Now on lamictal and lexapro and elevil for sleep.   [name] Anxiety   . Ascending aortic aneurysm ([place])    a. 06/2018 CT: 4.1cm.  . B12 nutritional deficiency   . Chest pain    a. 2007 MV: No ischemia; b. 09/2018 recurrent c/p in setting of PE; c. 09/2018 MV: No ischemia/infarct. EF 55-65%.  . Closed head injury    laceration  9 12  no  loc  vision change  . DDD (degenerative disc disease)   . Diabetes mellitus    resolved with gastric bypass  . GERD (gastroesophageal reflux disease)    no meds  . Gout   . Headache    once per week uses Indomethicin hx of cluster headaches   . History of bunionectomy of left great toe   . History of concussion   . History of echocardiogram    a. 09/2018 Echo: Ef 65-70%, no rwma, mild MR. Ao root 30mm (mildly dil).  . History of renal stone    Seen in emergency room not urologist  . Hyperlipidemia   . Hypertension 02/27/2017  . Nonspecific pain in the lumbar region   . OBESITY 12/10/2007   a. s/p gastric bypass.  . OSA (obstructive sleep apnea) 12/12/2013  . Osteoarthritis of knee   . Pain in left knee   . Pancreatitis 03/21/2016   went to [place]  . Perforated ulcer ([place])    Surgical repair9/24 2011  . Pulmonary embolism ([place])    a. 09/18/2018 CTA Chest: RLL segmental arterial branch small volume PE-->Eliquis.    Family History  Problem Relation Age of Onset  . Uterine cancer Mother   . Hypertension Mother   . Cervical cancer Mother   . Heart disease Father   . COPD Father   . Seizures Father   . Arthritis Father   . Arthritis  Sister   . Diabetes Sister   . Colon cancer Maternal Uncle   . Colon cancer Paternal Aunt   . Diabetes Paternal Aunt   . Heart disease Maternal Grandmother   . Heart disease Maternal Grandfather   . Heart disease Paternal Grandmother   . Heart disease Paternal Grandfather   . Diabetes Paternal Uncle     SOCIAL HISTORY: Social History   Socioeconomic History  . Marital status: Married    Spouse name: [name]  . Number of children: 2  . Years of education: Associates  . Highest education level: Not on file  Occupational History  . Occupation: retired    Fish farm manager: [employer]  Tobacco Use  . Smoking status: Never Smoker  . Smokeless tobacco: Never Used  Vaping Use  . Vaping Use: Never used  Substance and Sexual  Activity  . Alcohol use: Yes    Comment: occas liquor   . Drug use: No  . Sexual activity: Yes  Other Topics Concern  . 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   Patient has two children.   Patient has a college education.   Regular exercise-no   Employed Event organiser and investigation   school travels       10 hours work     Sleep  No longer needs  cpap   Patient is right-handed.   Patient drinks one soda daily.   trainiing for [employer]      Social Determinants of Health   Financial Resource Strain:   . Difficulty of Paying Living Expenses:   Food Insecurity:   . Worried About Charity fundraiser in the Last Year:   . Arboriculturist in the Last Year:   Transportation Needs:   . Film/video editor (Medical):   [name] Lack of Transportation (Non-Medical):   Physical Activity:   . Days of Exercise per Week:   . Minutes of Exercise per Session:   Stress:   . Feeling of Stress :   Social Connections:   . Frequency of Communication with Friends and Family:   . Frequency of Social Gatherings with Friends and Family:   . Attends Religious Services:   . Active Member of Clubs or Organizations:   . Attends Archivist Meetings:   [name] Marital Status:   Intimate Partner Violence:   . Fear of Current or Ex-Partner:   . Emotionally Abused:   [name] Physically Abused:   . Sexually Abused:     Allergies  Allergen Reactions  . Aspirin Other (See Comments)    perforated ulcer  . Cymbalta [Duloxetine Hcl] Other (See Comments)    Over-sedating; slept over 24 hours with one dose.  Possible serotonin syndrome.  . Tape Rash    Surgical tape  . Nsaids Other (See Comments)    Perforated ulcer  . Hydrocodone Other (See Comments)    headache  . Vancomycin Rash    Red man syndrome.  Please premedicate.    Current Outpatient Medications  Medication Sig Dispense Refill  . acetaminophen (TYLENOL) 325 MG tablet  Take 325 mg by mouth every 6 (six) hours as needed for moderate pain. Take with oxycodone 15 mg doses    . albuterol (PROVENTIL HFA;VENTOLIN HFA) 108 (90 Base) MCG/ACT inhaler Inhale 2 puffs into the lungs every 6 (six) hours as needed for wheezing or shortness of breath. 1 Inhaler 0  . allopurinol (  ZYLOPRIM) 100 MG tablet TAKE ONE TABLET BY MOUTH DAILY (Patient taking differently: Take 100 mg by mouth daily. ) 30 tablet 5  . celecoxib (CELEBREX) 200 MG capsule Take 200 mg by mouth daily as needed for pain.    . Cholecalciferol (VITAMIN D-1000 MAX ST) 1000 units tablet Take 1,000 Units by mouth daily.    . colchicine 0.6 MG tablet Take 1 tablet (0.6 mg total) by mouth 2 (two) times daily. (Patient taking differently: Take 0.6 mg by mouth daily as needed (gout). ) 30 tablet 1  . diazepam (VALIUM) 5 MG tablet Take 5 mg by mouth every 6 (six) hours as needed for anxiety.    [name] escitalopram (LEXAPRO) 20 MG tablet Take 20 mg by mouth every morning.     [name] HYDROmorphone (DILAUDID) 8 MG tablet Take 8 mg by mouth 4 (four) times daily as needed for severe pain.    . indomethacin (INDOCIN) 50 MG capsule Take 1 capsule (50 mg total) by mouth 2 (two) times daily with a meal. (Patient taking differently: Take 50 mg by mouth 2 (two) times daily as needed (headaches). ) 60 capsule 1  . lamoTRIgine (LAMICTAL) 100 MG tablet Take 150 mg by mouth 2 (two) times daily.     [name] losartan-hydrochlorothiazide (HYZAAR) 50-12.5 MG tablet Take 1 tablet by mouth daily.    . methocarbamol (ROBAXIN) 500 MG tablet Take 500 mg by mouth every 8 (eight) hours as needed for muscle spasms.     . Multiple Vitamin (MULTIVITAMIN) tablet Take 1 tablet by mouth daily.    [name] omeprazole (PRILOSEC) 20 MG capsule Take 20 mg by mouth daily as needed for heartburn.    . pregabalin (LYRICA) 75 MG capsule Take 75 mg by mouth daily.     [name] Spacer/Aero-Holding Chambers (E-Z SPACER) inhaler Use as instructed 1 each 2  . sucralfate (CARAFATE) 1 g tablet Take  1 g by mouth 2 (two) times daily.     . tamsulosin (FLOMAX) 0.4 MG CAPS capsule Take 0.4 mg by mouth daily as needed (kidney stones).     . traZODone (DESYREL) 100 MG tablet Take 300 mg by mouth at bedtime.    . vitamin B-12 (CYANOCOBALAMIN) 1000 MCG tablet Take 1,000 mcg by mouth daily.    . nitroGLYCERIN (NITROSTAT) 0.3 MG SL tablet Place 1 tablet (0.3 mg total) under the tongue every 5 (five) minutes as needed for chest pain. 100 tablet 3  . oxyCODONE-acetaminophen (PERCOCET/ROXICET) 5-325 MG tablet Take by mouth every 4 (four) hours as needed for severe pain. (Patient not taking: Reported on 03/20/2020)     Current Facility-Administered Medications  Medication Dose Route Frequency Provider Last Rate Last Admin  . indomethacin (INDOCIN) capsule 50 mg  50 mg Oral TID PRN Wallene Huh, DPM        REVIEW OF SYSTEMS:  [X]  denotes positive finding, [ ]  denotes negative finding Cardiac  Comments:  Chest pain or chest pressure:    Shortness of breath upon exertion:    Short of breath when lying flat:    Irregular heart rhythm:        Vascular    Pain in calf, thigh, or hip brought on by ambulation:    Pain in feet at night that wakes you up from your sleep:     Blood clot in your veins:    Leg swelling:         Pulmonary    Oxygen at home:    Productive cough:  Wheezing:         Neurologic    Sudden weakness in arms or legs:  x   Sudden numbness in arms or legs:  x   Sudden onset of difficulty speaking or slurred speech:    Temporary loss of vision in one eye:     Problems with dizziness:         Gastrointestinal    Blood in stool:     Vomited blood:         Genitourinary    Burning when urinating:     Blood in urine:        Psychiatric    Major depression:         Hematologic    Bleeding problems:    Problems with blood clotting too easily:        Skin    Rashes or ulcers:        Constitutional    Fever or chills:      PHYSICAL EXAM: Vitals:   03/20/20  1029  BP: (!) 80/51  Pulse: 94  Resp: 20  Temp: 98.1 F (36.7 C)  SpO2: 92%  Weight: 158 lb (71.7 kg)  Height: 5\' 10"  (1.778 m)    GENERAL: The patient is a well-nourished male, in no acute distress. The vital signs are documented above. CARDIOVASCULAR: Carotid arteries without bruits bilaterally.  2+ radial pulses bilaterally.  2+ popliteal pulses bilaterally PULMONARY: There is good air exchange  ABDOMEN: Soft and non-tender.  Multiple laparoscopic incisions and transverse incision at the level of the umbilicus MUSCULOSKELETAL: There are no major deformities or cyanosis. NEUROLOGIC: No focal weakness or paresthesias are detected. SKIN: There are no ulcers or rashes noted. PSYCHIATRIC: The patient has a normal affect.  DATA:  CT lumbar films available from November 09, 2019.  This reveals no evidence of aortoiliac calcification.  The aortic bifurcation is at the L4-5 level.  There is evidence of prior L4-5 disc instrumentation  MEDICAL ISSUES: Had long discussion with patient regarding my role in surgery.  Explained Rolena Infante has recommended surgery for resolution of his pain and portion of this from the anterior approach and therefore is asked for our involvement.  I explained that a transverse incision at the level of L4-5.  Explained that we would mobilize the rectus muscle medially and into the external retroperitoneal space with mobilization of the intraperitoneal contents to the right.  I explained mobilization of the left ureter to the right and mobilization of the arterial venous structures overlying the spine.  Discussed potential injury to all of these particularly venous injury.  He appears to be an acceptable risk for surgery.  He is not obese and has no evidence of aortoiliac occlusive disease.  I did discuss my concern regarding his multiple prior intra-abdominal surgeries and that this can make retroperitoneal exposure more difficult.  Also with his prior instrumentation fusion  and removal of hardware suspect that he will have a great deal of inflammation and scarring over his 4 5 disc itself.  He understands and wishes to proceed with surgery as scheduled in 1 week   Rosetta Posner, MD [facility] Vascular and Vein Specialists of [facility] Tel [phone] Pager [phone]

## 2020-03-22 ENCOUNTER — Other Ambulatory Visit: Payer: Self-pay

## 2020-03-22 ENCOUNTER — Ambulatory Visit: Payer: Federal, State, Local not specified - PPO | Admitting: Podiatry

## 2020-03-22 ENCOUNTER — Encounter: Payer: Self-pay | Admitting: Podiatry

## 2020-03-22 DIAGNOSIS — M10071 Idiopathic gout, right ankle and foot: Secondary | ICD-10-CM | POA: Diagnosis not present

## 2020-03-22 DIAGNOSIS — M7752 Other enthesopathy of left foot: Secondary | ICD-10-CM

## 2020-03-22 DIAGNOSIS — M10072 Idiopathic gout, left ankle and foot: Secondary | ICD-10-CM

## 2020-03-23 ENCOUNTER — Ambulatory Visit: Payer: Federal, State, Local not specified - PPO | Admitting: Podiatry

## 2020-03-23 NOTE — Pre-Procedure Instructions (Signed)
Your procedure is scheduled on Thursday, July .  Report to [facility] Main Entrance "A" at 5:30 A.M., and check in at the Admitting office.  Call this number if you have problems the morning of surgery:  [phone]  Call [phone] if you have any questions prior to your surgery date Monday-Friday 8am-4pm    Remember:  Do not eat after midnight the night before your surgery    Take these medicines the morning of surgery with A SIP OF WATER  allopurinol (ZYLOPRIM) Colchicine escitalopram (LEXAPRO) pregabalin (LYRICA)  Spacer Inhaler  As needed: acetaminophen (TYLENOL) Albuterol inhaler-please bring with you on the day of surgery. diazepam (VALIUM)  HYDROmorphone (DILAUDID)-for severe pain indomethacin (INDOCIN)  methocarbamol (ROBAXIN)  omeprazole (PRILOSEC)  tamsulosin (FLOMAX)  As of today, STOP taking any Aspirin (unless otherwise instructed by your surgeon) Aleve, Naproxen, Ibuprofen, Motrin, Advil, Goody's, BC's, all herbal medications, fish oil, and all vitamins. This includes: celecoxib (CELEBREX)                       Do not wear jewelry.            Do not wear lotions, powders, colognes, or deodorant.            Men may shave face and neck.            Do not bring valuables to the hospital.            [facility] is not responsible for any belongings or valuables.  Do NOT Smoke (Tobacco/Vaping) or drink Alcohol 24 hours prior to your procedure If you use a CPAP at night, you may bring all equipment for your overnight stay.   Contacts, glasses, dentures or bridgework may not be worn into surgery.      For patients admitted to the hospital, discharge time will be determined by your treatment team.   Patients discharged the day of surgery will not be allowed to drive home, and someone needs to stay with them for 24 hours.    Special instructions:   [facility]- Preparing For Surgery  Before surgery, you can play an important role. Because skin is not  sterile, your skin needs to be as free of germs as possible. You can reduce the number of germs on your skin by washing with CHG (chlorahexidine gluconate) Soap before surgery.  CHG is an antiseptic cleaner which kills germs and bonds with the skin to continue killing germs even after washing.    Oral Hygiene is also important to reduce your risk of infection.  Remember - BRUSH YOUR TEETH THE MORNING OF SURGERY WITH YOUR REGULAR TOOTHPASTE  Please do not use if you have an allergy to CHG or antibacterial soaps. If your skin becomes reddened/irritated stop using the CHG.  Do not shave (including legs and underarms) for at least 48 hours prior to first CHG shower. It is OK to shave your face.  Please follow these instructions carefully.   1. Shower the NIGHT BEFORE SURGERY and the MORNING OF SURGERY with CHG Soap.   2. If you chose to wash your hair, wash your hair first as usual with your normal shampoo.  3. After you shampoo, rinse your hair and body thoroughly to remove the shampoo.  4. Use CHG as you would any other liquid soap. You can apply CHG directly to the skin and wash gently with a scrungie or a clean washcloth.   5. Apply the CHG Soap to  your body ONLY FROM THE NECK DOWN.  Do not use on open wounds or open sores. Avoid contact with your eyes, ears, mouth and genitals (private parts). Wash Face and genitals (private parts)  with your normal soap.   6. Wash thoroughly, paying special attention to the area where your surgery will be performed.  7. Thoroughly rinse your body with warm water from the neck down.  8. DO NOT shower/wash with your normal soap after using and rinsing off the CHG Soap.  9. Pat yourself dry with a CLEAN TOWEL.  10. Wear CLEAN PAJAMAS to bed the night before surgery  11. Place CLEAN SHEETS on your bed the night of your first shower and DO NOT SLEEP WITH PETS.   Day of Surgery: Wear Clean/Comfortable clothing the morning of surgery Do not apply any  deodorants/lotions.   Remember to brush your teeth WITH YOUR REGULAR TOOTHPASTE.   Please read over the following fact sheets that you were given.

## 2020-03-25 ENCOUNTER — Encounter: Payer: Self-pay | Admitting: Podiatry

## 2020-03-25 NOTE — Progress Notes (Signed)
Subjective:  Patient ID: Hector [name], male    DOB: 06/15/1961,  MRN: [id number]  Chief Complaint  Patient presents with  . Gout    Patient presents today for gout flare up left ankle, started yesterday, now very painful, can't hardly walk.  He reqeust an injection today    59 y.o. male presents with the above complaint.  Patient presents with a complaint of left ankle joint gout flare.  Patient states that it feels similar to the right side.  Patient states the right side is doing really well.  He is has an acute flare of the left ankle this time.  He has not seen any rheumatology.  He wants to know if he could get another steroid injection as a steroid injection on the right side worked tremendously.  He denies any other acute complaints.  He has not been able to control it himself.  He does have of history of eating red meat diet.   Review of Systems: Negative except as noted in the HPI. Denies N/V/F/Ch.  Past Medical History:  Diagnosis Date  . Abnormal LFTs    improved after gastic bypass  . ADJ DISORDER WITH MIXED ANXIETY & DEPRESSED MOOD 08/23/2009   Qualifier: Diagnosis of  By: [name] MD, Standley Brooking Now on lamictal and lexapro and elevil for sleep.   [name] Anxiety   . Ascending aortic aneurysm ([place])    a. 06/2018 CT: 4.1cm.  . B12 nutritional deficiency   . Chest pain    a. 2007 MV: No ischemia; b. 09/2018 recurrent c/p in setting of PE; c. 09/2018 MV: No ischemia/infarct. EF 55-65%.  . Closed head injury    laceration  9 12  no loc  vision change  . DDD (degenerative disc disease)   . Diabetes mellitus    resolved with gastric bypass  . GERD (gastroesophageal reflux disease)    no meds  . Gout   . Headache    once per week uses Indomethicin hx of cluster headaches   . History of bunionectomy of left great toe   . History of concussion   . History of echocardiogram    a. 09/2018 Echo: Ef 65-70%, no rwma, mild MR. Ao root 71mm (mildly dil).  . History of renal stone    Seen in  emergency room not urologist  . Hyperlipidemia   . Hypertension 02/27/2017  . Nonspecific pain in the lumbar region   . OBESITY 12/10/2007   a. s/p gastric bypass.  . OSA (obstructive sleep apnea) 12/12/2013  . Osteoarthritis of knee   . Pain in left knee   . Pancreatitis 03/21/2016   went to [place]  . Perforated ulcer ([place])    Surgical repair9/24 2011  . Pulmonary embolism ([place])    a. 09/18/2018 CTA Chest: RLL segmental arterial branch small volume PE-->Eliquis.    Current Outpatient Medications:  .  acetaminophen (TYLENOL) 325 MG tablet, Take 325 mg by mouth every 6 (six) hours as needed for moderate pain. Take with oxycodone 15 mg doses, Disp: , Rfl:  .  albuterol (PROVENTIL HFA;VENTOLIN HFA) 108 (90 Base) MCG/ACT inhaler, Inhale 2 puffs into the lungs every 6 (six) hours as needed for wheezing or shortness of breath., Disp: 1 Inhaler, Rfl: 0 .  allopurinol (ZYLOPRIM) 100 MG tablet, TAKE ONE TABLET BY MOUTH DAILY (Patient taking differently: Take 100 mg by mouth daily. ), Disp: 30 tablet, Rfl: 5 .  celecoxib (CELEBREX) 200 MG capsule, Take 200 mg by mouth  daily as needed for pain., Disp: , Rfl:  .  Cholecalciferol (VITAMIN D-1000 MAX ST) 1000 units tablet, Take 1,000 Units by mouth daily., Disp: , Rfl:  .  colchicine 0.6 MG tablet, Take 1 tablet (0.6 mg total) by mouth 2 (two) times daily. (Patient taking differently: Take 0.6 mg by mouth daily as needed (gout). ), Disp: 30 tablet, Rfl: 1 .  diazepam (VALIUM) 5 MG tablet, Take 5 mg by mouth every 6 (six) hours as needed for anxiety., Disp: , Rfl:  .  escitalopram (LEXAPRO) 20 MG tablet, Take 20 mg by mouth every morning. , Disp: , Rfl:  .  HYDROmorphone (DILAUDID) 8 MG tablet, Take 8 mg by mouth 4 (four) times daily as needed for severe pain., Disp: , Rfl:  .  indomethacin (INDOCIN) 50 MG capsule, Take 1 capsule (50 mg total) by mouth 2 (two) times daily with a meal. (Patient taking differently: Take 50 mg by mouth 2 (two) times daily  as needed (headaches). ), Disp: 60 capsule, Rfl: 1 .  lamoTRIgine (LAMICTAL) 100 MG tablet, Take 150 mg by mouth 2 (two) times daily. , Disp: , Rfl:  .  losartan-hydrochlorothiazide (HYZAAR) 50-12.5 MG tablet, Take 1 tablet by mouth daily., Disp: , Rfl:  .  methocarbamol (ROBAXIN) 500 MG tablet, Take 500 mg by mouth every 8 (eight) hours as needed for muscle spasms. , Disp: , Rfl:  .  Multiple Vitamin (MULTIVITAMIN) tablet, Take 1 tablet by mouth daily., Disp: , Rfl:  .  nitroGLYCERIN (NITROSTAT) 0.3 MG SL tablet, Place 1 tablet (0.3 mg total) under the tongue every 5 (five) minutes as needed for chest pain., Disp: 100 tablet, Rfl: 3 .  omeprazole (PRILOSEC) 20 MG capsule, Take 20 mg by mouth daily as needed for heartburn., Disp: , Rfl:  .  oxyCODONE-acetaminophen (PERCOCET/ROXICET) 5-325 MG tablet, Take by mouth every 4 (four) hours as needed for severe pain. (Patient not taking: Reported on 03/20/2020), Disp: , Rfl:  .  pregabalin (LYRICA) 75 MG capsule, Take 75 mg by mouth daily. , Disp: , Rfl:  .  Spacer/Aero-Holding Chambers (E-Z SPACER) inhaler, Use as instructed, Disp: 1 each, Rfl: 2 .  sucralfate (CARAFATE) 1 g tablet, Take 1 g by mouth 2 (two) times daily. , Disp: , Rfl:  .  tamsulosin (FLOMAX) 0.4 MG CAPS capsule, Take 0.4 mg by mouth daily as needed (kidney stones). , Disp: , Rfl:  .  traZODone (DESYREL) 100 MG tablet, Take 300 mg by mouth at bedtime., Disp: , Rfl:  .  vitamin B-12 (CYANOCOBALAMIN) 1000 MCG tablet, Take 1,000 mcg by mouth daily., Disp: , Rfl:   Current Facility-Administered Medications:  .  indomethacin (INDOCIN) capsule 50 mg, 50 mg, Oral, TID PRN, Wallene Huh, DPM  Social History   Tobacco Use  Smoking Status Never Smoker  Smokeless Tobacco Never Used    Allergies  Allergen Reactions  . Aspirin Other (See Comments)    perforated ulcer  . Cymbalta [Duloxetine Hcl] Other (See Comments)    Over-sedating; slept over 24 hours with one dose.  Possible serotonin  syndrome.  . Tape Rash    Surgical tape  . Nsaids Other (See Comments)    Perforated ulcer  . Hydrocodone Other (See Comments)    headache  . Vancomycin Rash    Red man syndrome.  Please premedicate.   Objective:  There were no vitals filed for this visit. There is no height or weight on file to calculate BMI. Constitutional Well developed.  Well nourished.  Vascular Dorsalis pedis pulses palpable bilaterally. Posterior tibial pulses palpable bilaterally. Capillary refill normal to all digits.  No cyanosis or clubbing noted. Pedal hair growth normal.  Neurologic Normal speech. Oriented to person, place, and time. Epicritic sensation to light touch grossly present bilaterally.  Dermatologic Nails well groomed and normal in appearance. No open wounds. No skin lesions.  Orthopedic:  Pain on palpation to the medial gutter of the left ankle.  No pain on the lateral gutter of the left ankle.  Pain with range of motion of the ankle joint.  No pain at the Achilles tendon, peroneal tendon, posterior tibial tendon, ATFL ligament.   Radiographs: None Assessment:   1. Acute idiopathic gout of right ankle   2. Capsulitis of right ankle    Plan:  Patient was evaluated and treated and all questions answered.  Left ankle capsulitis secondary to gout flare -I discussed with the patient the etiology of capsulitis in relationship of an acute gout flare and various treatment options were extensively discussed.  I believe patient will benefit from steroid injection help decrease the acute inflammatory component associated with pain.  I also discussed with the patient the etiology of gout and various treatment options to maintain and control including diet modification.  I also discussed with him to obtain labs as well as do a rheumatology follow-up for long-term management of gout.  Patient at this time is hesitant of rheumatology follow-up.  He states that if he continues to get further flareup he  will consider doing rheumatology follow-up.   No follow-ups on file.

## 2020-03-26 ENCOUNTER — Encounter (HOSPITAL_COMMUNITY)
Admission: RE | Admit: 2020-03-26 | Discharge: 2020-03-26 | Disposition: A | Discharge status: Home / Self Care | Payer: Federal, State, Local not specified - PPO | Source: Ambulatory Visit | Attending: Orthopedic Surgery | Admitting: Orthopedic Surgery

## 2020-03-26 ENCOUNTER — Other Ambulatory Visit (HOSPITAL_COMMUNITY)
Admission: RE | Admit: 2020-03-26 | Discharge: 2020-03-26 | Disposition: A | Discharge status: Home / Self Care | Payer: Federal, State, Local not specified - PPO | Source: Ambulatory Visit | Attending: Orthopedic Surgery | Admitting: Orthopedic Surgery

## 2020-03-26 ENCOUNTER — Encounter (HOSPITAL_COMMUNITY): Payer: Self-pay

## 2020-03-26 ENCOUNTER — Other Ambulatory Visit: Payer: Self-pay

## 2020-03-26 ENCOUNTER — Ambulatory Visit: Payer: Self-pay | Admitting: Orthopedic Surgery

## 2020-03-26 DIAGNOSIS — Z881 Allergy status to other antibiotic agents status: Secondary | ICD-10-CM | POA: Diagnosis not present

## 2020-03-26 DIAGNOSIS — E785 Hyperlipidemia, unspecified: Secondary | ICD-10-CM | POA: Diagnosis not present

## 2020-03-26 DIAGNOSIS — Y848 Other medical procedures as the cause of abnormal reaction of the patient, or of later complication, without mention of misadventure at the time of the procedure: Secondary | ICD-10-CM | POA: Diagnosis not present

## 2020-03-26 DIAGNOSIS — Z86711 Personal history of pulmonary embolism: Secondary | ICD-10-CM | POA: Diagnosis not present

## 2020-03-26 DIAGNOSIS — I10 Essential (primary) hypertension: Secondary | ICD-10-CM | POA: Diagnosis not present

## 2020-03-26 DIAGNOSIS — F319 Bipolar disorder, unspecified: Secondary | ICD-10-CM | POA: Diagnosis not present

## 2020-03-26 DIAGNOSIS — M960 Pseudarthrosis after fusion or arthrodesis: Secondary | ICD-10-CM | POA: Diagnosis not present

## 2020-03-26 DIAGNOSIS — Z20822 Contact with and (suspected) exposure to covid-19: Secondary | ICD-10-CM | POA: Diagnosis not present

## 2020-03-26 DIAGNOSIS — K219 Gastro-esophageal reflux disease without esophagitis: Secondary | ICD-10-CM | POA: Diagnosis not present

## 2020-03-26 DIAGNOSIS — Z9884 Bariatric surgery status: Secondary | ICD-10-CM | POA: Diagnosis not present

## 2020-03-26 DIAGNOSIS — F419 Anxiety disorder, unspecified: Secondary | ICD-10-CM | POA: Diagnosis not present

## 2020-03-26 DIAGNOSIS — M109 Gout, unspecified: Secondary | ICD-10-CM | POA: Diagnosis not present

## 2020-03-26 DIAGNOSIS — G629 Polyneuropathy, unspecified: Secondary | ICD-10-CM | POA: Diagnosis not present

## 2020-03-26 HISTORY — DX: Personal history of urinary calculi: Z87.442

## 2020-03-26 HISTORY — DX: Other specified postprocedural states: Z98.890

## 2020-03-26 HISTORY — DX: Nausea with vomiting, unspecified: R11.2

## 2020-03-26 HISTORY — DX: Bipolar disorder, unspecified: F31.9

## 2020-03-26 HISTORY — DX: Myoneural disorder, unspecified: G70.9

## 2020-03-26 HISTORY — DX: Depression, unspecified: F32.A

## 2020-03-26 HISTORY — DX: Polyneuropathy, unspecified: G62.9

## 2020-03-26 LAB — TYPE AND SCREEN
ABO/RH(D): O POS
Antibody Screen: NEGATIVE

## 2020-03-26 LAB — GLUCOSE, CAPILLARY: Glucose-Capillary: 83 mg/dL (ref 70–99)

## 2020-03-26 LAB — CBC
HCT: 39.9 % (ref 39.0–52.0)
Hemoglobin: 12.9 g/dL — ABNORMAL LOW (ref 13.0–17.0)
MCH: 33.5 pg (ref 26.0–34.0)
MCHC: 32.3 g/dL (ref 30.0–36.0)
MCV: 103.6 fL — ABNORMAL HIGH (ref 80.0–100.0)
Platelets: 155 10*3/uL (ref 150–400)
RBC: 3.85 MIL/uL — ABNORMAL LOW (ref 4.22–5.81)
RDW: 12.4 % (ref 11.5–15.5)
WBC: 3.1 10*3/uL — ABNORMAL LOW (ref 4.0–10.5)
nRBC: 0 % (ref 0.0–0.2)

## 2020-03-26 LAB — COMPREHENSIVE METABOLIC PANEL
ALT: 26 U/L (ref 0–44)
AST: 61 U/L — ABNORMAL HIGH (ref 15–41)
Albumin: 3.1 g/dL — ABNORMAL LOW (ref 3.5–5.0)
Alkaline Phosphatase: 57 U/L (ref 38–126)
Anion gap: 8 (ref 5–15)
BUN: 9 mg/dL (ref 6–20)
CO2: 27 mmol/L (ref 22–32)
Calcium: 8.8 mg/dL — ABNORMAL LOW (ref 8.9–10.3)
Chloride: 105 mmol/L (ref 98–111)
Creatinine, Ser: 1.31 mg/dL — ABNORMAL HIGH (ref 0.61–1.24)
GFR calc Af Amer: >60 mL/min (ref >60)
GFR calc non Af Amer: 60 mL/min — ABNORMAL LOW (ref >60)
Glucose, Bld: 97 mg/dL (ref 70–99)
Potassium: 3.6 mmol/L (ref 3.5–5.1)
Sodium: 140 mmol/L (ref 135–145)
Total Bilirubin: 0.5 mg/dL (ref 0.3–1.2)
Total Protein: 5.7 g/dL — ABNORMAL LOW (ref 6.5–8.1)

## 2020-03-26 LAB — SURGICAL PCR SCREEN
MRSA, PCR: NEGATIVE
Staphylococcus aureus: NEGATIVE

## 2020-03-26 LAB — HEMOGLOBIN A1C
Hgb A1c MFr Bld: 5 % (ref 4.8–5.6)
Mean Plasma Glucose: 96.8 mg/dL

## 2020-03-26 LAB — SARS CORONAVIRUS 2 (TAT 6-24 HRS): SARS Coronavirus 2: NEGATIVE

## 2020-03-26 NOTE — Progress Notes (Signed)
Low BP at PAT appointment: Patient denies SHOB, CP, weakness, dizziness. Patient acknowledges taking BP medicine (losartan-hydrochlorothiazide (HYZAAR) 50-12.5 MG tablet) this morning as well as pain medicine (HYDROmorphone (DILAUDID)).   03/26/20 0808 03/26/20 0812  Vitals  Temp (!) 97.3 F (36.3 C)  --   Temp Source Oral  --   Pulse Rate 84  --   Pulse Rate Source Monitor;Left  --   Resp 20  --   BP 91/67  --   SpO2 94 %  --   Height and Weight  Height 5\' 10"  (1.778 m)  --   Weight 77.1 kg  --   BMI (Calculated) 24.39  --   BSA (Calculated - sq m) 1.95 sq meters  --   Pain Assessment  Pain Score  --  7  Pain Type  --  Chronic pain  Pain Location  --  Back  Pain Orientation  --  Lower;Right;Left  Pain Descriptors / Indicators  --  Aching;Dull;Constant  Pain Onset  --  On-going  LOC  Level of Consciousness  --  Alert  Orientation Level  --  Oriented X4

## 2020-03-26 NOTE — Progress Notes (Signed)
PCP - [name], MD Cardiologist - Denies  PPM/ICD - Denies  Chest x-ray - 07/30/19 EKG - 08/04/19 Stress Test - 09/27/18 ECHO - 09/27/18 Cardiac Cath - Denies  Sleep Study - Yes, positive for OSA. Resolved with gastric bypass. CPAP - No  Patient is diabetic. However, per patient, he does not check CBG since DM was resolved with weight loss sx. CBG at PAT appointment was 87. A1C obtained.  Blood Thinner Instructions: N/A Aspirin Instructions: N/A  ERAS Protcol - N/A PRE-SURGERY Ensure or G2- N/A  COVID TEST- 03/26/20   Anesthesia review: Yes, review Stress and ECHO.  Patient denies shortness of breath, fever, cough and chest pain at PAT appointment   All instructions explained to the patient, with a verbal understanding of the material. Patient agrees to go over the instructions while at home for a better understanding. Patient also instructed to self quarantine after being tested for COVID-19. The opportunity to ask questions was provided.

## 2020-03-26 NOTE — Pre-Procedure Instructions (Signed)
Your procedure is scheduled on Thursday, July 15 .  Report to [facility] Main Entrance "A" at 5:30 A.M., and check in at the Admitting office.  Call this number if you have problems the morning of surgery:  [phone]  Call [phone] if you have any questions prior to your surgery date Monday-Friday 8am-4pm    Remember:  Do not eat after midnight the night before your surgery    Take these medicines the morning of surgery with A SIP OF WATER  allopurinol (ZYLOPRIM) Colchicine escitalopram (LEXAPRO) pregabalin (LYRICA)  Spacer Inhaler  As needed: acetaminophen (TYLENOL) Albuterol inhaler-please bring with you on the day of surgery. diazepam (VALIUM)  HYDROmorphone (DILAUDID)-for severe pain indomethacin (INDOCIN)  methocarbamol (ROBAXIN)  omeprazole (PRILOSEC)  tamsulosin (FLOMAX)  As of today, STOP taking any Aspirin (unless otherwise instructed by your surgeon) Aleve, Naproxen, Ibuprofen, Motrin, Advil, Goody's, BC's, all herbal medications, fish oil, and all vitamins. This includes: celecoxib (CELEBREX)                       Do not wear jewelry.            Do not wear lotions, powders, colognes, or deodorant.            Men may shave face and neck.            Do not bring valuables to the hospital.            [facility] is not responsible for any belongings or valuables.  Do NOT Smoke (Tobacco/Vaping) or drink Alcohol 24 hours prior to your procedure If you use a CPAP at night, you may bring all equipment for your overnight stay.   Contacts, glasses, dentures or bridgework may not be worn into surgery.      For patients admitted to the hospital, discharge time will be determined by your treatment team.   Patients discharged the day of surgery will not be allowed to drive home, and someone needs to stay with them for 24 hours.    Special instructions:   [facility]- Preparing For Surgery  Before surgery, you can play an important role. Because skin is not  sterile, your skin needs to be as free of germs as possible. You can reduce the number of germs on your skin by washing with CHG (chlorahexidine gluconate) Soap before surgery.  CHG is an antiseptic cleaner which kills germs and bonds with the skin to continue killing germs even after washing.    Oral Hygiene is also important to reduce your risk of infection.  Remember - BRUSH YOUR TEETH THE MORNING OF SURGERY WITH YOUR REGULAR TOOTHPASTE  Please do not use if you have an allergy to CHG or antibacterial soaps. If your skin becomes reddened/irritated stop using the CHG.  Do not shave (including legs and underarms) for at least 48 hours prior to first CHG shower. It is OK to shave your face.  Please follow these instructions carefully.   1. Shower the NIGHT BEFORE SURGERY and the MORNING OF SURGERY with CHG Soap.   2. If you chose to wash your hair, wash your hair first as usual with your normal shampoo.  3. After you shampoo, rinse your hair and body thoroughly to remove the shampoo.  4. Use CHG as you would any other liquid soap. You can apply CHG directly to the skin and wash gently with a scrungie or a clean washcloth.   5. Apply the CHG Soap  to your body ONLY FROM THE NECK DOWN.  Do not use on open wounds or open sores. Avoid contact with your eyes, ears, mouth and genitals (private parts). Wash Face and genitals (private parts)  with your normal soap.   6. Wash thoroughly, paying special attention to the area where your surgery will be performed.  7. Thoroughly rinse your body with warm water from the neck down.  8. DO NOT shower/wash with your normal soap after using and rinsing off the CHG Soap.  9. Pat yourself dry with a CLEAN TOWEL.  10. Wear CLEAN PAJAMAS to bed the night before surgery  11. Place CLEAN SHEETS on your bed the night of your first shower and DO NOT SLEEP WITH PETS.   Day of Surgery: Wear Clean/Comfortable clothing the morning of surgery Do not apply any  deodorants/lotions.   Remember to brush your teeth WITH YOUR REGULAR TOOTHPASTE.   Please read over the following fact sheets that you were given.

## 2020-03-27 ENCOUNTER — Other Ambulatory Visit: Payer: Self-pay | Admitting: Orthopedic Surgery

## 2020-03-27 ENCOUNTER — Ambulatory Visit
Admission: RE | Admit: 2020-03-27 | Discharge: 2020-03-27 | Disposition: A | Discharge status: Home / Self Care | Payer: Federal, State, Local not specified - PPO | Source: Ambulatory Visit | Attending: Orthopedic Surgery | Admitting: Orthopedic Surgery

## 2020-03-27 DIAGNOSIS — Z01818 Encounter for other preprocedural examination: Secondary | ICD-10-CM

## 2020-03-27 NOTE — Progress Notes (Signed)
Anesthesia Chart Review:  Pt had PE January 2020. No obvious proximate cause. No personal or family history of blood clot. Hypercoagulable workup negative per heme notes. He completed a total of 6 months on Eliquis at the direction of hematology. Repeat CTA 03/22/19 demonstrated resolution of embolus. Ectatic ascending aorta measuring 4 cm was also noted and 70yr followup scan was recommended. He had a repeat scan 07/30/19 that showed unchanged enlargement of the tubular ascending thoracic aorta. Hepatic steatosis was again noted.   He had been having chest tightness associated with dyspnea prior to diagnosis of PE and was initially treated for pneumonia.  After diagnosis of PE, he had recurrent chest tightness and dyspnea resulting in hospitalization in mid January with nonischemic stress testing and normal LV function by echo.  Hx of gastric bypass with significant weight loss.   Clearance from PCP Dr. Kary Kos dated 01/25/2020, copy on chart.  Preop labs reviewed, creatinine mildly elevated at 1.31, AST mildly elevated at 61.   EKG 07/30/19: Sinus tachycardia, rate 124, LAFB - This tracing was done in setting of ED admission for dehydration.  EKG 03/21/19: NSR. Rate 89. LAFB  TTE 09/27/18: - Left ventricle: The cavity size was normal. There was mild  concentric hypertrophy. Systolic function was vigorous. The  estimated ejection fraction was in the range of 65% to 70%. Wall  motion was normal; there were no regional wall motion  abnormalities. Left ventricular diastolic function parameters  were normal.  - Aortic valve: There was mild regurgitation.  - Aorta: Aortic root dimension: 41 mm (ED).  - Aortic root: The aortic root was mildly dilated.  - Pulmonary arteries: Systolic pressure could not be accurately  estimated.   Nuclear stress 09/27/18:  There was no ST segment deviation noted during stress.  No T wave inversion was noted during stress.  The study is normal.  This  is a low risk study.  The left ventricular ejection fraction is normal (55-65%).  X[place], [place] [facility]/Anesthesiology Phone [phone] 03/27/2020 4:12 PM

## 2020-03-27 NOTE — Anesthesia Preprocedure Evaluation (Addendum)
Anesthesia Evaluation  Patient identified by MRN, date of birth, ID band Patient awake    Reviewed: Allergy & Precautions, NPO status , Patient's Chart, lab work & pertinent test results  History of Anesthesia Complications Negative for: history of anesthetic complications  Airway Mallampati: I  TM Distance: >3 FB Neck ROM: Full    Dental no notable dental hx. (+) Dental Advisory Given, Teeth Intact   Pulmonary PE PE 09/2018- No obvious proximate cause. No personal or family history of blood clot. Hypercoagulable workup negative per heme notes. He completed a total of 6 months on Eliquis at the direction of hematology. Repeat CTA 03/22/19 demonstrated resolution of embolus   Pulmonary exam normal breath sounds clear to auscultation       Cardiovascular hypertension, Pt. on medications Normal cardiovascular exam+ Valvular Problems/Murmurs (mild AI) AI  Rhythm:Regular Rate:Normal  Last echo 09/2018: - Left ventricle: The cavity size was normal. There was mild  concentric hypertrophy. Systolic function was vigorous. The  estimated ejection fraction was in the range of 65% to 70%. Wall  motion was normal; there were no regional wall motion  abnormalities. Left ventricular diastolic function parameters  were normal.  - Aortic valve: There was mild regurgitation.  - Aorta: Aortic root dimension: 41 mm (ED).  - Aortic root: The aortic root was mildly dilated.  - Pulmonary arteries: Systolic pressure could not be accurately  estimated.    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 Aortic Atherosclerosis (ICD10-I70.0).   Neuro/Psych  Headaches, PSYCHIATRIC DISORDERS Anxiety Depression Bipolar Disorder    GI/Hepatic Neg liver ROS, PUD, GERD  Controlled,S/p gastric bypass   Endo/Other  negative endocrine ROSneg diabetes  Renal/GU negative  Renal ROS  negative genitourinary   Musculoskeletal  (+) Arthritis , Osteoarthritis,  Pseudoarthrosis L4-5, prior TLIF L4-5   Abdominal   Peds negative pediatric ROS (+)  Hematology negative hematology ROS (+) hct 40   Anesthesia Other Findings   Reproductive/Obstetrics negative OB ROS                           Anesthesia Physical Anesthesia Plan  ASA: III  Anesthesia Plan: General and Regional   Post-op Pain Management:  Regional for Post-op pain   Induction: Intravenous  PONV Risk Score and Plan: 3 and Ondansetron, Dexamethasone, Midazolam, Treatment may vary due to age or medical condition, Scopolamine patch - Pre-op, Propofol infusion and Diphenhydramine  Airway Management Planned: Oral ETT  Additional Equipment: None  Intra-op Plan:   Post-operative Plan: Extubation in OR  Informed Consent: I have reviewed the patients History and Physical, chart, labs and discussed the procedure including the risks, benefits and alternatives for the proposed anesthesia with the patient or authorized representative who has indicated his/her understanding and acceptance.     Dental advisory given  Plan Discussed with: CRNA  Anesthesia Plan Comments: ( )       Anesthesia Quick Evaluation

## 2020-03-29 ENCOUNTER — Inpatient Hospital Stay (HOSPITAL_COMMUNITY): Payer: Federal, State, Local not specified - PPO

## 2020-03-29 ENCOUNTER — Inpatient Hospital Stay (HOSPITAL_COMMUNITY)
Admission: RE | Admit: 2020-03-29 | Discharge: 2020-03-31 | DRG: 460 | Disposition: A | Discharge status: Home / Self Care | Payer: Federal, State, Local not specified - PPO | Attending: Orthopedic Surgery | Admitting: Orthopedic Surgery

## 2020-03-29 ENCOUNTER — Inpatient Hospital Stay (HOSPITAL_COMMUNITY): Payer: Federal, State, Local not specified - PPO | Admitting: Certified Registered"

## 2020-03-29 ENCOUNTER — Encounter (HOSPITAL_COMMUNITY): Payer: Self-pay | Admitting: Orthopedic Surgery

## 2020-03-29 ENCOUNTER — Other Ambulatory Visit: Payer: Self-pay

## 2020-03-29 ENCOUNTER — Inpatient Hospital Stay (HOSPITAL_COMMUNITY): Payer: Federal, State, Local not specified - PPO | Admitting: Physician Assistant

## 2020-03-29 ENCOUNTER — Encounter (HOSPITAL_COMMUNITY)
Admission: RE | Disposition: A | Discharge status: Home / Self Care | Payer: Self-pay | Source: Home / Self Care | Attending: Orthopedic Surgery

## 2020-03-29 DIAGNOSIS — E785 Hyperlipidemia, unspecified: Secondary | ICD-10-CM | POA: Diagnosis present

## 2020-03-29 DIAGNOSIS — F419 Anxiety disorder, unspecified: Secondary | ICD-10-CM | POA: Diagnosis present

## 2020-03-29 DIAGNOSIS — M4326 Fusion of spine, lumbar region: Secondary | ICD-10-CM | POA: Diagnosis present

## 2020-03-29 DIAGNOSIS — Z9889 Other specified postprocedural states: Secondary | ICD-10-CM | POA: Diagnosis not present

## 2020-03-29 DIAGNOSIS — M960 Pseudarthrosis after fusion or arthrodesis: Principal | ICD-10-CM | POA: Diagnosis present

## 2020-03-29 DIAGNOSIS — M109 Gout, unspecified: Secondary | ICD-10-CM | POA: Diagnosis present

## 2020-03-29 DIAGNOSIS — M48062 Spinal stenosis, lumbar region with neurogenic claudication: Secondary | ICD-10-CM | POA: Diagnosis not present

## 2020-03-29 DIAGNOSIS — Z881 Allergy status to other antibiotic agents status: Secondary | ICD-10-CM | POA: Diagnosis not present

## 2020-03-29 DIAGNOSIS — Z981 Arthrodesis status: Secondary | ICD-10-CM | POA: Diagnosis not present

## 2020-03-29 DIAGNOSIS — I10 Essential (primary) hypertension: Secondary | ICD-10-CM | POA: Diagnosis present

## 2020-03-29 DIAGNOSIS — Z9884 Bariatric surgery status: Secondary | ICD-10-CM

## 2020-03-29 DIAGNOSIS — Y848 Other medical procedures as the cause of abnormal reaction of the patient, or of later complication, without mention of misadventure at the time of the procedure: Secondary | ICD-10-CM | POA: Diagnosis present

## 2020-03-29 DIAGNOSIS — Z01818 Encounter for other preprocedural examination: Secondary | ICD-10-CM | POA: Diagnosis not present

## 2020-03-29 DIAGNOSIS — Z86711 Personal history of pulmonary embolism: Secondary | ICD-10-CM

## 2020-03-29 DIAGNOSIS — M47816 Spondylosis without myelopathy or radiculopathy, lumbar region: Secondary | ICD-10-CM | POA: Diagnosis not present

## 2020-03-29 DIAGNOSIS — Z419 Encounter for procedure for purposes other than remedying health state, unspecified: Secondary | ICD-10-CM

## 2020-03-29 DIAGNOSIS — Z20822 Contact with and (suspected) exposure to covid-19: Secondary | ICD-10-CM | POA: Diagnosis present

## 2020-03-29 DIAGNOSIS — T81599A Other complications of foreign body accidentally left in body following unspecified procedure, initial encounter: Secondary | ICD-10-CM | POA: Diagnosis not present

## 2020-03-29 DIAGNOSIS — G629 Polyneuropathy, unspecified: Secondary | ICD-10-CM | POA: Diagnosis present

## 2020-03-29 DIAGNOSIS — G8918 Other acute postprocedural pain: Secondary | ICD-10-CM | POA: Diagnosis not present

## 2020-03-29 DIAGNOSIS — K219 Gastro-esophageal reflux disease without esophagitis: Secondary | ICD-10-CM | POA: Diagnosis present

## 2020-03-29 DIAGNOSIS — F319 Bipolar disorder, unspecified: Secondary | ICD-10-CM | POA: Diagnosis present

## 2020-03-29 HISTORY — PX: ABDOMINAL EXPOSURE: SHX5708

## 2020-03-29 HISTORY — PX: ANTERIOR LUMBAR FUSION: SHX1170

## 2020-03-29 LAB — GLUCOSE, CAPILLARY: Glucose-Capillary: 109 mg/dL — ABNORMAL HIGH (ref 70–99)

## 2020-03-29 LAB — ABO/RH: ABO/RH(D): O POS

## 2020-03-29 SURGERY — ANTERIOR LUMBAR FUSION 1 LEVEL
Anesthesia: Regional

## 2020-03-29 MED ORDER — LACTATED RINGERS IV SOLN: INTRAVENOUS | Status: DC | PRN

## 2020-03-29 MED ORDER — METHOCARBAMOL 500 MG PO TABS
500.0000 mg | ORAL_TABLET | Freq: Four times a day (QID) | ORAL | Status: DC | PRN
  Administered 2020-03-29: 500 mg via ORAL

## 2020-03-29 MED ORDER — PHENYLEPHRINE HCL-NACL 10-0.9 MG/250ML-% IV SOLN: INTRAVENOUS | Status: DC | PRN

## 2020-03-29 MED ORDER — PROMETHAZINE HCL 25 MG/ML IJ SOLN: 6.2500 mg | INTRAMUSCULAR | Status: DC | PRN

## 2020-03-29 MED ORDER — PROPOFOL 10 MG/ML IV BOLUS
INTRAVENOUS | Status: DC | PRN
  Administered 2020-03-29: 200 mg via INTRAVENOUS

## 2020-03-29 MED ORDER — MENTHOL 3 MG MT LOZG: 1.0000 | LOZENGE | OROMUCOSAL | Status: DC | PRN

## 2020-03-29 MED ORDER — ACETAMINOPHEN 500 MG PO TABS
1000.0000 mg | ORAL_TABLET | Freq: Once | ORAL | Status: AC
  Administered 2020-03-29: 1000 mg via ORAL
  Filled 2020-03-29: qty 2

## 2020-03-29 MED ORDER — FLEET ENEMA 7-19 GM/118ML RE ENEM: 1.0000 | ENEMA | Freq: Once | RECTAL | Status: DC | PRN

## 2020-03-29 MED ORDER — NITROGLYCERIN 0.4 MG SL SUBL: 0.4000 mg | SUBLINGUAL_TABLET | SUBLINGUAL | Status: DC | PRN

## 2020-03-29 MED ORDER — PHENYLEPHRINE 40 MCG/ML (10ML) SYRINGE FOR IV PUSH (FOR BLOOD PRESSURE SUPPORT)
PREFILLED_SYRINGE | INTRAVENOUS | Status: DC | PRN
  Administered 2020-03-29: 120 ug via INTRAVENOUS

## 2020-03-29 MED ORDER — CEFAZOLIN SODIUM 1 G IJ SOLR
INTRAMUSCULAR | Status: AC
  Filled 2020-03-29: qty 20

## 2020-03-29 MED ORDER — SUGAMMADEX SODIUM 200 MG/2ML IV SOLN
INTRAVENOUS | Status: DC | PRN
  Administered 2020-03-29: 150 mg via INTRAVENOUS

## 2020-03-29 MED ORDER — LACTATED RINGERS IV SOLN: INTRAVENOUS | Status: DC

## 2020-03-29 MED ORDER — SCOPOLAMINE 1 MG/3DAYS TD PT72
1.0000 | MEDICATED_PATCH | TRANSDERMAL | Status: DC
  Administered 2020-03-29: 1.5 mg via TRANSDERMAL
  Filled 2020-03-29: qty 1

## 2020-03-29 MED ORDER — PROPOFOL 500 MG/50ML IV EMUL
INTRAVENOUS | Status: DC | PRN
  Administered 2020-03-29: 75 ug/kg/min via INTRAVENOUS

## 2020-03-29 MED ORDER — ACETAMINOPHEN 10 MG/ML IV SOLN
INTRAVENOUS | Status: AC
  Filled 2020-03-29: qty 100

## 2020-03-29 MED ORDER — LOSARTAN POTASSIUM 50 MG PO TABS
50.0000 mg | ORAL_TABLET | Freq: Every day | ORAL | Status: DC
  Administered 2020-03-29 – 2020-03-30 (×2): 50 mg via ORAL
  Filled 2020-03-29: qty 1

## 2020-03-29 MED ORDER — CHLORHEXIDINE GLUCONATE 0.12 % MT SOLN
15.0000 mL | Freq: Once | OROMUCOSAL | Status: AC
  Administered 2020-03-29: 15 mL via OROMUCOSAL
  Filled 2020-03-29: qty 15

## 2020-03-29 MED ORDER — OXYCODONE HCL 5 MG PO TABS: 10.0000 mg | ORAL_TABLET | ORAL | Status: DC | PRN

## 2020-03-29 MED ORDER — PREGABALIN 75 MG PO CAPS
75.0000 mg | ORAL_CAPSULE | Freq: Every day | ORAL | Status: DC
  Administered 2020-03-29 – 2020-03-30 (×2): 75 mg via ORAL
  Filled 2020-03-29 (×2): qty 1

## 2020-03-29 MED ORDER — MIDAZOLAM HCL 2 MG/2ML IJ SOLN
INTRAMUSCULAR | Status: AC
  Filled 2020-03-29: qty 2

## 2020-03-29 MED ORDER — PROPOFOL 1000 MG/100ML IV EMUL
INTRAVENOUS | Status: AC
  Filled 2020-03-29: qty 100

## 2020-03-29 MED ORDER — CHLORHEXIDINE GLUCONATE 4 % EX LIQD: 60.0000 mL | Freq: Once | CUTANEOUS | Status: DC

## 2020-03-29 MED ORDER — BUPIVACAINE LIPOSOME 1.3 % IJ SUSP
INTRAMUSCULAR | Status: DC | PRN
  Administered 2020-03-29: 10 mL via PERINEURAL

## 2020-03-29 MED ORDER — CEFAZOLIN SODIUM-DEXTROSE 2-4 GM/100ML-% IV SOLN
2.0000 g | INTRAVENOUS | Status: AC
  Administered 2020-03-29 (×2): 2 g via INTRAVENOUS
  Filled 2020-03-29: qty 100

## 2020-03-29 MED ORDER — OXYCODONE HCL 5 MG/5ML PO SOLN: 5.0000 mg | Freq: Once | ORAL | Status: AC | PRN

## 2020-03-29 MED ORDER — DEXMEDETOMIDINE (PRECEDEX) IN NS 20 MCG/5ML (4 MCG/ML) IV SYRINGE
PREFILLED_SYRINGE | INTRAVENOUS | Status: DC | PRN
  Administered 2020-03-29: 8 ug via INTRAVENOUS

## 2020-03-29 MED ORDER — TOBRAMYCIN SULFATE 1.2 G IJ SOLR
INTRAMUSCULAR | Status: DC | PRN
  Administered 2020-03-29: 1.2 g via TOPICAL

## 2020-03-29 MED ORDER — ENOXAPARIN SODIUM 40 MG/0.4ML ~~LOC~~ SOLN
40.0000 mg | SUBCUTANEOUS | Status: DC
  Administered 2020-03-30: 40 mg via SUBCUTANEOUS
  Filled 2020-03-29 (×2): qty 0.4

## 2020-03-29 MED ORDER — SODIUM CHLORIDE 0.9% FLUSH: 3.0000 mL | INTRAVENOUS | Status: DC | PRN

## 2020-03-29 MED ORDER — BUPIVACAINE LIPOSOME 1.3 % IJ SUSP
10.0000 mL | Freq: Once | INTRAMUSCULAR | Status: AC
  Administered 2020-03-29: 10 mL
  Filled 2020-03-29: qty 10

## 2020-03-29 MED ORDER — ONDANSETRON HCL 4 MG/2ML IJ SOLN: 4.0000 mg | Freq: Four times a day (QID) | INTRAMUSCULAR | Status: DC | PRN

## 2020-03-29 MED ORDER — ONDANSETRON HCL 4 MG/2ML IJ SOLN
INTRAMUSCULAR | Status: DC | PRN
  Administered 2020-03-29: 4 mg via INTRAVENOUS

## 2020-03-29 MED ORDER — METHOCARBAMOL 1000 MG/10ML IJ SOLN
500.0000 mg | Freq: Four times a day (QID) | INTRAVENOUS | Status: DC | PRN
  Filled 2020-03-29: qty 5

## 2020-03-29 MED ORDER — ONDANSETRON HCL 4 MG PO TABS: 4.0000 mg | ORAL_TABLET | Freq: Three times a day (TID) | ORAL | 0 refills | Status: DC | PRN

## 2020-03-29 MED ORDER — TOBRAMYCIN SULFATE 1.2 G IJ SOLR
INTRAMUSCULAR | Status: AC
  Filled 2020-03-29: qty 1.2

## 2020-03-29 MED ORDER — ACETAMINOPHEN 650 MG RE SUPP: 650.0000 mg | RECTAL | Status: DC | PRN

## 2020-03-29 MED ORDER — THROMBIN 20000 UNITS EX SOLR: CUTANEOUS | Status: DC | PRN

## 2020-03-29 MED ORDER — METHOCARBAMOL 500 MG PO TABS
ORAL_TABLET | ORAL | Status: AC
  Filled 2020-03-29: qty 1

## 2020-03-29 MED ORDER — ORAL CARE MOUTH RINSE: 15.0000 mL | Freq: Once | OROMUCOSAL | Status: AC

## 2020-03-29 MED ORDER — FENTANYL CITRATE (PF) 250 MCG/5ML IJ SOLN
INTRAMUSCULAR | Status: AC
  Filled 2020-03-29: qty 5

## 2020-03-29 MED ORDER — ONDANSETRON HCL 4 MG/2ML IJ SOLN
INTRAMUSCULAR | Status: AC
  Filled 2020-03-29: qty 2

## 2020-03-29 MED ORDER — ROCURONIUM BROMIDE 10 MG/ML (PF) SYRINGE
PREFILLED_SYRINGE | INTRAVENOUS | Status: DC | PRN
  Administered 2020-03-29: 100 mg via INTRAVENOUS
  Administered 2020-03-29 (×2): 20 mg via INTRAVENOUS
  Administered 2020-03-29: 10 mg via INTRAVENOUS

## 2020-03-29 MED ORDER — HYDROMORPHONE HCL 1 MG/ML IJ SOLN
1.0000 mg | INTRAMUSCULAR | Status: DC | PRN
  Administered 2020-03-29: 1 mg via INTRAVENOUS
  Filled 2020-03-29: qty 1

## 2020-03-29 MED ORDER — LIDOCAINE 2% (20 MG/ML) 5 ML SYRINGE
INTRAMUSCULAR | Status: DC | PRN
  Administered 2020-03-29: 20 mg via INTRAVENOUS

## 2020-03-29 MED ORDER — FENTANYL CITRATE (PF) 100 MCG/2ML IJ SOLN
INTRAMUSCULAR | Status: DC | PRN
  Administered 2020-03-29 (×4): 50 ug via INTRAVENOUS
  Administered 2020-03-29: 100 ug via INTRAVENOUS
  Administered 2020-03-29: 50 ug via INTRAVENOUS

## 2020-03-29 MED ORDER — DEXAMETHASONE SODIUM PHOSPHATE 10 MG/ML IJ SOLN
INTRAMUSCULAR | Status: AC
  Filled 2020-03-29: qty 1

## 2020-03-29 MED ORDER — MIDAZOLAM HCL 2 MG/2ML IJ SOLN
INTRAMUSCULAR | Status: DC | PRN
  Administered 2020-03-29: 2 mg via INTRAVENOUS

## 2020-03-29 MED ORDER — METHOCARBAMOL 500 MG PO TABS: 500.0000 mg | ORAL_TABLET | Freq: Three times a day (TID) | ORAL | 0 refills | Status: DC | PRN

## 2020-03-29 MED ORDER — ONDANSETRON HCL 4 MG PO TABS: 4.0000 mg | ORAL_TABLET | Freq: Four times a day (QID) | ORAL | Status: DC | PRN

## 2020-03-29 MED ORDER — ACETAMINOPHEN 325 MG PO TABS
650.0000 mg | ORAL_TABLET | ORAL | Status: DC | PRN
  Administered 2020-03-30 – 2020-03-31 (×2): 650 mg via ORAL
  Filled 2020-03-29 (×2): qty 2

## 2020-03-29 MED ORDER — POLYETHYLENE GLYCOL 3350 17 G PO PACK: 17.0000 g | PACK | Freq: Every day | ORAL | Status: DC | PRN

## 2020-03-29 MED ORDER — ROCURONIUM BROMIDE 10 MG/ML (PF) SYRINGE
PREFILLED_SYRINGE | INTRAVENOUS | Status: AC
  Filled 2020-03-29: qty 10

## 2020-03-29 MED ORDER — 0.9 % SODIUM CHLORIDE (POUR BTL) OPTIME
TOPICAL | Status: DC | PRN
  Administered 2020-03-29: 1000 mL

## 2020-03-29 MED ORDER — OXYCODONE HCL 5 MG PO TABS: 5.0000 mg | ORAL_TABLET | ORAL | Status: DC | PRN

## 2020-03-29 MED ORDER — HEMOSTATIC AGENTS (NO CHARGE) OPTIME
TOPICAL | Status: DC | PRN
  Administered 2020-03-29 (×2): 1 via TOPICAL

## 2020-03-29 MED ORDER — HYDROCHLOROTHIAZIDE 12.5 MG PO CAPS
12.5000 mg | ORAL_CAPSULE | Freq: Every day | ORAL | Status: DC
  Administered 2020-03-29 – 2020-03-30 (×2): 12.5 mg via ORAL
  Filled 2020-03-29 (×2): qty 1

## 2020-03-29 MED ORDER — OXYCODONE HCL 5 MG PO TABS
5.0000 mg | ORAL_TABLET | Freq: Once | ORAL | Status: AC | PRN
  Administered 2020-03-29: 5 mg via ORAL

## 2020-03-29 MED ORDER — PROPOFOL 10 MG/ML IV BOLUS
INTRAVENOUS | Status: AC
  Filled 2020-03-29: qty 20

## 2020-03-29 MED ORDER — BUPIVACAINE LIPOSOME 1.3 % IJ SUSP
20.0000 mL | Freq: Once | INTRAMUSCULAR | Status: DC
  Filled 2020-03-29: qty 20

## 2020-03-29 MED ORDER — LIDOCAINE 2% (20 MG/ML) 5 ML SYRINGE
INTRAMUSCULAR | Status: AC
  Filled 2020-03-29: qty 5

## 2020-03-29 MED ORDER — OXYCODONE HCL 5 MG PO TABS
ORAL_TABLET | ORAL | Status: AC
  Filled 2020-03-29: qty 1

## 2020-03-29 MED ORDER — ACETAMINOPHEN 10 MG/ML IV SOLN
INTRAVENOUS | Status: DC | PRN
Start: 2020-03-29 — End: 2020-03-29
  Administered 2020-03-29: 1000 mg via INTRAVENOUS

## 2020-03-29 MED ORDER — HYDROMORPHONE HCL 1 MG/ML IJ SOLN
INTRAMUSCULAR | Status: AC
  Filled 2020-03-29: qty 1

## 2020-03-29 MED ORDER — BUPIVACAINE-EPINEPHRINE (PF) 0.25% -1:200000 IJ SOLN
INTRAMUSCULAR | Status: AC
  Filled 2020-03-29: qty 30

## 2020-03-29 MED ORDER — ENOXAPARIN SODIUM 40 MG/0.4ML ~~LOC~~ SOLN
40.0000 mg | SUBCUTANEOUS | 0 refills | Status: DC
Start: 2020-03-29 — End: 2024-07-13

## 2020-03-29 MED ORDER — DOCUSATE SODIUM 100 MG PO CAPS
100.0000 mg | ORAL_CAPSULE | Freq: Two times a day (BID) | ORAL | Status: DC
  Administered 2020-03-29 – 2020-03-30 (×3): 100 mg via ORAL
  Filled 2020-03-29 (×3): qty 1

## 2020-03-29 MED ORDER — KETAMINE HCL 50 MG/5ML IJ SOSY
PREFILLED_SYRINGE | INTRAMUSCULAR | Status: AC
  Filled 2020-03-29: qty 10

## 2020-03-29 MED ORDER — LOSARTAN POTASSIUM-HCTZ 50-12.5 MG PO TABS: 1.0000 | ORAL_TABLET | Freq: Every day | ORAL | Status: DC

## 2020-03-29 MED ORDER — SODIUM CHLORIDE 0.9% FLUSH
3.0000 mL | Freq: Two times a day (BID) | INTRAVENOUS | Status: DC
  Administered 2020-03-29: 3 mL via INTRAVENOUS

## 2020-03-29 MED ORDER — PHENOL 1.4 % MT LIQD: 1.0000 | OROMUCOSAL | Status: DC | PRN

## 2020-03-29 MED ORDER — CEFAZOLIN SODIUM-DEXTROSE 1-4 GM/50ML-% IV SOLN
1.0000 g | Freq: Three times a day (TID) | INTRAVENOUS | Status: AC
  Administered 2020-03-29 – 2020-03-30 (×2): 1 g via INTRAVENOUS
  Filled 2020-03-29 (×2): qty 50

## 2020-03-29 MED ORDER — TAMSULOSIN HCL 0.4 MG PO CAPS: 0.4000 mg | ORAL_CAPSULE | Freq: Every day | ORAL | Status: DC | PRN

## 2020-03-29 MED ORDER — DEXAMETHASONE SODIUM PHOSPHATE 10 MG/ML IJ SOLN
INTRAMUSCULAR | Status: DC | PRN
  Administered 2020-03-29: 10 mg via INTRAVENOUS

## 2020-03-29 MED ORDER — THROMBIN (RECOMBINANT) 20000 UNITS EX SOLR
CUTANEOUS | Status: AC
  Filled 2020-03-29: qty 20000

## 2020-03-29 MED ORDER — ALBUTEROL SULFATE (2.5 MG/3ML) 0.083% IN NEBU: 2.5000 mg | INHALATION_SOLUTION | Freq: Four times a day (QID) | RESPIRATORY_TRACT | Status: DC | PRN

## 2020-03-29 MED ORDER — OXYCODONE-ACETAMINOPHEN 10-325 MG PO TABS: 1.0000 | ORAL_TABLET | Freq: Four times a day (QID) | ORAL | 0 refills | Status: DC | PRN

## 2020-03-29 MED ORDER — COLCHICINE 0.6 MG PO TABS
0.6000 mg | ORAL_TABLET | Freq: Every day | ORAL | Status: DC | PRN
  Filled 2020-03-29: qty 1

## 2020-03-29 MED ORDER — HYDROMORPHONE HCL 2 MG PO TABS
8.0000 mg | ORAL_TABLET | Freq: Four times a day (QID) | ORAL | Status: DC | PRN
  Administered 2020-03-29 – 2020-03-31 (×7): 8 mg via ORAL
  Filled 2020-03-29 (×7): qty 4

## 2020-03-29 MED ORDER — ROPIVACAINE HCL 5 MG/ML IJ SOLN
INTRAMUSCULAR | Status: DC | PRN
  Administered 2020-03-29: 20 mL via PERINEURAL

## 2020-03-29 MED ORDER — ESCITALOPRAM OXALATE 20 MG PO TABS
20.0000 mg | ORAL_TABLET | Freq: Every morning | ORAL | Status: DC
  Administered 2020-03-29 – 2020-03-30 (×2): 20 mg via ORAL
  Filled 2020-03-29 (×3): qty 1

## 2020-03-29 MED ORDER — HYDROMORPHONE HCL 1 MG/ML IJ SOLN
0.2500 mg | INTRAMUSCULAR | Status: DC | PRN
  Administered 2020-03-29 (×2): 0.5 mg via INTRAVENOUS

## 2020-03-29 MED ORDER — KETAMINE HCL 10 MG/ML IJ SOLN
INTRAMUSCULAR | Status: DC | PRN
  Administered 2020-03-29 (×2): 10 mg via INTRAVENOUS
  Administered 2020-03-29: 15 mg via INTRAVENOUS
  Administered 2020-03-29: 35 mg via INTRAVENOUS
  Administered 2020-03-29: 20 mg via INTRAVENOUS

## 2020-03-29 MED ORDER — BUPIVACAINE-EPINEPHRINE 0.25% -1:200000 IJ SOLN
INTRAMUSCULAR | Status: DC | PRN
  Administered 2020-03-29: 30 mL

## 2020-03-29 SURGICAL SUPPLY — 116 items
ADH SKN CLS APL DERMABOND .7 (GAUZE/BANDAGES/DRESSINGS) ×1
AGENT HMST KT MTR STRL THRMB (HEMOSTASIS) ×2
APPLIER CLIP 11 MED OPEN (CLIP) ×4
APR CLP MED 11 20 MLT OPN (CLIP) ×2
BLADE CLIPPER SURG (BLADE) IMPLANT
BLADE SURG 10 STRL SS (BLADE) ×3 IMPLANT
BONE VIVIGEN FORMABLE 5.4CC (Bone Implant) ×2 IMPLANT
BUR EGG ELITE 4.0 (BURR) ×2 IMPLANT
CABLE BIPOLOR RESECTION CORD (MISCELLANEOUS) ×3 IMPLANT
CAGE INTERBODY NL 14 XL 7D (Cage) ×1 IMPLANT
CANISTER SUCT 3000ML PPV (MISCELLANEOUS) ×2 IMPLANT
CATH FOLEY 2WAY SLVR  5CC 16FR (CATHETERS) ×2
CATH FOLEY 2WAY SLVR 5CC 16FR (CATHETERS) ×1 IMPLANT
CLIP APPLIE 11 MED OPEN (CLIP) ×2 IMPLANT
CLIP LIGATING EXTRA MED SLVR (CLIP) ×1 IMPLANT
CLIP LIGATING EXTRA SM BLUE (MISCELLANEOUS) ×1 IMPLANT
CLIP NEUROVISION LG (CLIP) ×1 IMPLANT
CLSR STERI-STRIP ANTIMIC 1/2X4 (GAUZE/BANDAGES/DRESSINGS) ×3 IMPLANT
COVER SURGICAL LIGHT HANDLE (MISCELLANEOUS) ×4 IMPLANT
COVER WAND RF STERILE (DRAPES) ×6 IMPLANT
DERMABOND ADVANCED (GAUZE/BANDAGES/DRESSINGS) ×1
DERMABOND ADVANCED .7 DNX12 (GAUZE/BANDAGES/DRESSINGS) ×1 IMPLANT
DRAPE C-ARM 42X72 X-RAY (DRAPES) ×6 IMPLANT
DRAPE C-ARMOR (DRAPES) ×4 IMPLANT
DRAPE POUCH INSTRU U-SHP 10X18 (DRAPES) ×4 IMPLANT
DRAPE SURG 17X23 STRL (DRAPES) ×4 IMPLANT
DRAPE U-SHAPE 47X51 STRL (DRAPES) ×4 IMPLANT
DRSG OPSITE POSTOP 4X6 (GAUZE/BANDAGES/DRESSINGS) ×3 IMPLANT
DRSG OPSITE POSTOP 4X8 (GAUZE/BANDAGES/DRESSINGS) ×4 IMPLANT
DURAPREP 26ML APPLICATOR (WOUND CARE) ×4 IMPLANT
ELECT BLADE 4.0 EZ CLEAN MEGAD (MISCELLANEOUS) ×6
ELECT BLADE 6.5 EXT (BLADE) IMPLANT
ELECT CAUTERY BLADE 6.4 (BLADE) ×2 IMPLANT
ELECT PENCIL ROCKER SW 15FT (MISCELLANEOUS) ×4 IMPLANT
ELECT REM PT RETURN 9FT ADLT (ELECTROSURGICAL) ×4
ELECTRODE BLDE 4.0 EZ CLN MEGD (MISCELLANEOUS) ×3 IMPLANT
ELECTRODE REM PT RTRN 9FT ADLT (ELECTROSURGICAL) ×2 IMPLANT
GAUZE 4X4 16PLY RFD (DISPOSABLE) IMPLANT
GLOVE BIO SURGEON STRL SZ 6.5 (GLOVE) ×2 IMPLANT
GLOVE BIO SURGEON STRL SZ7.5 (GLOVE) IMPLANT
GLOVE BIOGEL PI IND STRL 6.5 (GLOVE) ×1 IMPLANT
GLOVE BIOGEL PI IND STRL 8.5 (GLOVE) ×3 IMPLANT
GLOVE BIOGEL PI INDICATOR 6.5 (GLOVE) ×1
GLOVE BIOGEL PI INDICATOR 8.5 (GLOVE) ×3
GLOVE SS BIOGEL STRL SZ 7.5 (GLOVE) ×1 IMPLANT
GLOVE SS BIOGEL STRL SZ 8.5 (GLOVE) ×3 IMPLANT
GLOVE SUPERSENSE BIOGEL SZ 7.5 (GLOVE) ×1
GLOVE SUPERSENSE BIOGEL SZ 8.5 (GLOVE) ×3
GOWN STRL REUS W/ TWL LRG LVL3 (GOWN DISPOSABLE) ×3 IMPLANT
GOWN STRL REUS W/TWL 2XL LVL3 (GOWN DISPOSABLE) ×6 IMPLANT
GOWN STRL REUS W/TWL LRG LVL3 (GOWN DISPOSABLE) ×2
GRAFT BNE MATRIX VG FRMBL MD 5 (Bone Implant) IMPLANT
GUIDEWIRE NITINOL BEVEL TIP (WIRE) ×1 IMPLANT
HEMOSTAT SNOW SURGICEL 2X4 (HEMOSTASIS) IMPLANT
INSERT FOGARTY 61MM (MISCELLANEOUS) IMPLANT
INSERT FOGARTY SM (MISCELLANEOUS) IMPLANT
KIT BASIN OR (CUSTOM PROCEDURE TRAY) ×4 IMPLANT
KIT HARVESTER BONE 10 DISP (KITS) ×1 IMPLANT
KIT POSITION SURG JACKSON T1 (MISCELLANEOUS) IMPLANT
KIT TURNOVER KIT B (KITS) ×4 IMPLANT
LOOP VESSEL MAXI BLUE (MISCELLANEOUS) IMPLANT
LOOP VESSEL MINI RED (MISCELLANEOUS) IMPLANT
MODULE EMG NDL SSEP NVM5 (NEEDLE) IMPLANT
MODULE EMG NEEDLE SSEP NVM5 (NEEDLE) ×2 IMPLANT
MODULE NVM5 NEXT GEN EMG (NEEDLE) ×1 IMPLANT
NDL SPNL 18GX3.5 QUINCKE PK (NEEDLE) ×3 IMPLANT
NEEDLE 22X1 1/2 (OR ONLY) (NEEDLE) ×2 IMPLANT
NEEDLE SPNL 18GX3.5 QUINCKE PK (NEEDLE) ×6 IMPLANT
NS IRRIG 1000ML POUR BTL (IV SOLUTION) ×4 IMPLANT
PACK LAMINECTOMY ORTHO (CUSTOM PROCEDURE TRAY) ×4 IMPLANT
PACK UNIVERSAL I (CUSTOM PROCEDURE TRAY) ×4 IMPLANT
PAD ARMBOARD 7.5X6 YLW CONV (MISCELLANEOUS) ×12 IMPLANT
PATTIES SURGICAL .5 X.5 (GAUZE/BANDAGES/DRESSINGS) IMPLANT
PATTIES SURGICAL .5 X1 (DISPOSABLE) ×2 IMPLANT
POSITIONER HEAD PRONE TRACH (MISCELLANEOUS) ×2 IMPLANT
PROBE BALL TIP NVM5 SNG USE (BALLOONS) ×1 IMPLANT
ROD RELINE MAS TI 5.5X35MM LRD (Rod) ×2 IMPLANT
SCREW 6.5X25 (Screw) ×1 IMPLANT
SCREW BN 30X6.5XNS SPNE (Screw) IMPLANT
SCREW BONE 6.5X30 (Screw) ×6 IMPLANT
SCREW LOCK RELINE 5.5 TULIP (Screw) ×4 IMPLANT
SCREW RED RELINE 7.5X45MM POLY (Screw) ×3 IMPLANT
SCREW RELINE MAS POLY 7.5X45MM (Screw) ×1 IMPLANT
SPONGE INTESTINAL PEANUT (DISPOSABLE) ×6 IMPLANT
SPONGE LAP 18X18 RF (DISPOSABLE) IMPLANT
SPONGE LAP 4X18 RFD (DISPOSABLE) ×4 IMPLANT
SPONGE SURGIFOAM ABS GEL 100 (HEMOSTASIS) ×2 IMPLANT
STAPLER VISISTAT 35W (STAPLE) IMPLANT
SURGIFLO W/THROMBIN 8M KIT (HEMOSTASIS) ×3 IMPLANT
SUT BONE WAX W31G (SUTURE) ×4 IMPLANT
SUT MNCRL AB 3-0 PS2 27 (SUTURE) ×6 IMPLANT
SUT PDS AB 1 CTX 36 (SUTURE) ×2 IMPLANT
SUT PROLENE 4 0 RB 1 (SUTURE)
SUT PROLENE 4-0 RB1 .5 CRCL 36 (SUTURE) IMPLANT
SUT PROLENE 5 0 C 1 24 (SUTURE) IMPLANT
SUT PROLENE 5 0 CC1 (SUTURE) IMPLANT
SUT PROLENE 6 0 C 1 30 (SUTURE) ×1 IMPLANT
SUT PROLENE 6 0 CC (SUTURE) IMPLANT
SUT SILK 0 TIES 10X30 (SUTURE) ×2 IMPLANT
SUT SILK 2 0 TIES 10X30 (SUTURE) ×4 IMPLANT
SUT SILK 2 0SH CR/8 30 (SUTURE) IMPLANT
SUT SILK 3 0 TIES 10X30 (SUTURE) ×4 IMPLANT
SUT SILK 3 0SH CR/8 30 (SUTURE) IMPLANT
SUT VIC AB 1 CT1 18XCR BRD 8 (SUTURE) ×1 IMPLANT
SUT VIC AB 1 CT1 27 (SUTURE) ×4
SUT VIC AB 1 CT1 27XBRD ANBCTR (SUTURE) ×2 IMPLANT
SUT VIC AB 1 CT1 8-18 (SUTURE) ×2
SUT VIC AB 2-0 CT1 18 (SUTURE) ×5 IMPLANT
SYR BULB IRRIG 60ML STRL (SYRINGE) ×4 IMPLANT
SYR CONTROL 10ML LL (SYRINGE) ×3 IMPLANT
TOWEL GREEN STERILE (TOWEL DISPOSABLE) ×6 IMPLANT
TOWEL GREEN STERILE FF (TOWEL DISPOSABLE) ×4 IMPLANT
TRAP SPECIMEN MUCUS 40CC (MISCELLANEOUS) ×2 IMPLANT
TRAY FOLEY MTR SLVR 16FR STAT (SET/KITS/TRAYS/PACK) ×2 IMPLANT
WATER STERILE IRR 1000ML POUR (IV SOLUTION) ×4 IMPLANT
YANKAUER SUCT BULB TIP NO VENT (SUCTIONS) ×2 IMPLANT

## 2020-03-29 NOTE — Anesthesia Procedure Notes (Signed)
Anesthesia Regional Block: TAP block   Pre-Anesthetic Checklist: ,, timeout performed, Correct Patient, Correct Site, Correct Laterality, Correct Procedure, Correct Position, site marked, Risks and benefits discussed,  Surgical consent,  Pre-op evaluation,  At surgeon's request and post-op pain management  Laterality: Left  Prep: Maximum Sterile Barrier Precautions used, chloraprep       Needles:  Injection technique: Single-shot  Needle Type: Echogenic Stimulator Needle     Needle Length: 9cm  Needle Gauge: 22     Additional Needles:   Procedures:,,,, ultrasound used (permanent image in chart),,,,  Narrative:  Start time: 03/29/2020 7:05 AM End time: 03/29/2020 7:14 AM Injection made incrementally with aspirations every 5 mL.  Performed by: Personally  Anesthesiologist: Pervis Hocking, DO  Additional Notes: Monitors applied. No increased pain on injection. No increased resistance to injection. Injection made in 5cc increments. Good needle visualization. Patient tolerated procedure well.

## 2020-03-29 NOTE — Discharge Instructions (Signed)
  Spinal Fusion, Adult, Care After This sheet gives you information about how to care for yourself after your procedure. Your doctor may also give you more specific instructions. If you have problems or questions, contact your doctor. Follow these instructions at home: Medicines Take over-the-counter and prescription medicines only as told by your doctor. These include any medicines for pain or blood-thinning medicines (anticoagulants). If you were prescribed an antibiotic medicine, take it as told by your doctor. Do not stop taking the antibiotic even if you start to feel better. Do not drive for 24 hours if you were given a medicine to help you relax (sedative) during your procedure. Do not drive or use heavy machinery while taking prescription pain medicine. If you have a brace: Wear the brace as told by your doctor. Take it off only as told by your doctor. Keep the brace clean. Managing pain, stiffness, and swelling If directed, put ice on the surgery area: If you have a removable brace, take it off as told by your doctor. Put ice in a plastic bag. Place a towel between your skin and the bag. Leave the ice on for 20 minutes, 2-3 times a day. Surgery cut care    Follow instructions from your doctor about how to take care of your cut from surgery (incision). Make sure you: Wash your hands with soap and water before you change your bandage (dressing). If you cannot use soap and water, use hand sanitizer. Change your bandage as told by your doctor. Leave stitches (sutures), skin glue, or skin tape (adhesive) strips in place. They may need to stay in place for 2 weeks or longer. If tape strips get loose and curl up, you may trim the loose edges. Do not remove tape strips completely unless your doctor says it is okay. Keep your cut from surgery clean and dry. Do not take baths, swim, or use a hot tub until your doctor says it is okay. Ask your doctor if you can take showers. You may only  be allowed to take sponge baths. Every day, check your cut from surgery and the area around it for: More redness, swelling, or pain. Fluid or blood. Warmth. Pus or a bad smell. If you have a drain tube, follow instructions from your doctor about caring for it. Do not take out the drain tube or any bandages unless your doctor says it is okay. Physical activity Rest and protect your back as much as possible. Follow instructions from your doctor about how to move. Use good posture to help your spine heal. Do not lift anything that is heavier than 8 lb (3.6 kg), or the limit that you are told, until your doctor says that it is safe. Do not twist or bend at the waist until your doctor says it is okay. It is best if you: Do not make pushing and pulling motions. Do not sit or lie down in the same position for a long time. Do not raise your hands or arms above your head. Return to your normal activities as told by your doctor. Ask your doctor what activities are safe for you. Rest and protect your back as much as you can. Do not start to exercise until your doctor says it is okay. Ask your doctor what kinds of exercise you can do to make your back stronger. Ok to shower in 5 days.  Do not take a bath or submerge the wound General instructions To prevent blood clots and lessen swelling   in your legs: Wear compression stockings as told. Walk one or more times every few hours as told by your doctor. Do not use any products that contain nicotine or tobacco, such as cigarettes and e-cigarettes. These can delay bone healing. If you need help quitting, ask your doctor. To prevent or treat constipation while you are taking prescription pain medicine, your doctor may suggest that you: Drink enough fluid to keep your pee (urine) pale yellow. Take over-the-counter or prescription medicines. Eat foods that are high in fiber. These include fresh fruits and vegetables, whole grains, and beans. Limit foods that  are high in fat and processed sugars, such as fried and sweet foods. Keep all follow-up visits as told by your doctor. This is important. Contact a doctor if: Your pain gets worse. Your medicine does not help your pain. Your legs or feet get painful or swollen. Your cut from surgery is more red, swollen, or painful. Your cut from surgery feels warm to the touch. You have: Fluid or blood coming from your cut from surgery. Pus or a bad smell coming from your cut from surgery. A fever. Weakness or loss of feeling (numbness) in your legs that is new or getting worse. Trouble controlling when you pee (urinate) or poop (have a bowel movement). You feel sick to your stomach (nauseous). You throw up (vomit). Get help right away if: Your pain is very bad. You have chest pain. You have trouble breathing. You start to have a cough. These symptoms may be an emergency. Do not wait to see if the symptoms will go away. Get medical help right away. Call your local emergency services (911 in the [place] Do not drive yourself to the hospital. Summary After the procedure, it is common to have pain in your back and pain by your surgery cut(s). Icing and pain medicines may help to control the pain. Follow directions from your doctor. Rest and protect your back as much as possible. Do not twist or bend at the waist. Get up and walk one or more times every few hours as told by your doctor. This information is not intended to replace advice given to you by your health care provider. Make sure you discuss any questions you have with your health care provider.  -signs and symptoms of a blood clot such as chest pain; shortness of breath; pain, swelling, or warmth in the leg -signs and symptoms of a stroke such as changes in vision; confusion; trouble speaking or understanding; severe headaches; sudden numbness or weakness of the face, arm or leg; trouble walking; dizziness; loss of coordination Side effects that  usually do not require medical attention (report to your doctor or health care professional if they continue or are bothersome): -hair loss -pain, redness, or irritation at site where injected This list may not describe all possible side effects. Call your doctor for medical advice about side effects. You may report side effects to FDA at 1-800-FDA-1088. Where should I keep my medicine? Keep out of the reach of children. Store at room temperature between 15 and 30 degrees C (59 and 86 degrees F). Do not freeze. If your injections have been specially prepared, you may need to store them in the refrigerator. Ask your pharmacist. Throw away any unused medicine after the expiration date. NOTE: This sheet is a summary. It may not cover all possible information. If you have questions about this medicine, talk to your doctor, pharmacist, or health care provider.       F). Do not freeze. If your injections have been specially prepared, you may need to store them in the refrigerator. Ask your pharmacist. Throw away any unused medicine after the expiration date. NOTE: This sheet is a summary. It may not cover all possible information. If you have questions about this medicine, talk to your doctor, pharmacist, or health care provider.

## 2020-03-29 NOTE — Anesthesia Postprocedure Evaluation (Signed)
Anesthesia Post Note  Patient: Hector [name]  Procedure(s) Performed: ANTERIOR LUMBAR FUSION (ALIF) L4-5 (N/A ) POSTERIOR L4-5 ARTHRODESIS WITH INSTRUMENTATION, ILIAC CREST BONE GRAFT (N/A ) ABDOMINAL EXPOSURE (N/A )     Patient location during evaluation: PACU Anesthesia Type: Regional and General Level of consciousness: awake and alert, oriented and patient cooperative Pain management: pain level controlled Vital Signs Assessment: post-procedure vital signs reviewed and stable Respiratory status: spontaneous breathing, nonlabored ventilation and respiratory function stable Cardiovascular status: blood pressure returned to baseline and stable Postop Assessment: no apparent nausea or vomiting Anesthetic complications: no   No complications documented.  Last Vitals:  Vitals:   03/29/20 1400 03/29/20 1412  BP: (!) 136/99 (!) 148/85  Pulse: 68 68  Resp: 18 16  Temp:  36.6 C  SpO2: 100% 100%    Last Pain:  Vitals:   03/29/20 1412  PainSc: [name]

## 2020-03-29 NOTE — H&P (Signed)
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  He continues to have significant back pain primarily from the pseudoarthrosis.  Surgical plan is to revise the fusion via an anterior lumbar interbody approach, and if required supplemental posterior fixation.  I have gone over the surgical procedure with the patient in great detail and all of his questions and concerns were addressed.  We have also reviewed the risks and benefits of surgery.  There is been no change to his clinical exam since his last office visit of 03/16/2020.  Chest x-ray completed on 03/27/2020 was negative.

## 2020-03-29 NOTE — Op Note (Addendum)
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 Anterior lumbar interbody fusion with removal of intervertebral cage and placement of new interbody titanium cage.   2. Posterior supplemental pedicle screw fixation L4-5. 3.  Anterior iliac crest autograft harvest through separate incision  Complications: It was noted that one of the posterior MIS pedicle screws had bio debris in it.  At this point since the other screws were already in place I chose not to remove them and lose fixation strength.  The wound class was upgraded to contaminated grade 3.  The wounds were copiously irrigated normal saline and in addition to the IV antibiotics that were administered and redosed during the procedure we also put topical tobramycin.  This was used because of the patient's vancomycin allergy.  First Assistant: Cleta Alberts, PA  Approach surgeon: Dr. Sherren Mocha Early  Implants: Titan anterior lumbar interbody cage.  14 mm 7 degree lordotic extra-large.  Removed peek interbody TLIF cage (13 mm height).  NuVasive posterior MIS pedicle screws.  7.5 x 45 mm length.  35 mm length rod.  Garft:  Autograft, and vivogen  Neuro monitoring: All 4 pedicle screws were directly stimulated and there was no adverse activity at greater than 40 mA.  EBL: 250 cc.  No return from cell saver.  Indications: Hector [name] is a very pleasant 59 year old gentleman who had a previous lumbar decompression followed by an instrumented fusion with interbody fixation.  He has had ongoing severe debilitating pain.  He recently was seen and evaluated and noted to have a pseudoarthrosis with ongoing motion at the surgical level.  As result of the severe pain and loss in quality of life we elected to move forward with a revision procedure.  Since his prior surgeries were all posterior I elected to  perform an anterior lumbar interbody fusion with removal of the cage and implantation of a new intervertebral device.  In this will be supplemented with posterior pedicle screw fixation.  All risks benefits and alternatives were explained to the patient and consent was obtained.  Operative note.  Patient was brought the operating room placed by the operating room table.  After successful induction of general anesthesia and endotracheal intubation, teds SCDs and a Foley were inserted.  The anterior abdomen was then prepped and draped in a standard fashion.  Timeout was taken to confirm patient procedure and all other important data.  At this point time Dr. Donnetta Hutching performed a standard anterior retroperitoneal approach to the lumbar spine.  Please refer to his dictation for details.  Once the retractor was positioned and the L4-5 disc space exposed a needle was placed and an x-ray was taken to confirm that I was at the appropriate level.  At this point time Dr. Donnetta Hutching scrubbed out of the case and I proceeded with the fusion.  An annulotomy was performed with a 10 blade scalpel and I remove a large portion of anterior disc material.  The intervertebral cage then came directly into view.  At this point using a curette I was able to easily remove the cage in the disc space.  There was no apparent fusion either at the L4 or L5 endplates.  I continue removing the disc material to the right of the cage.  I could now place a lamina spreader and open the intervertebral space to the point where I could easily manipulate and remove the cage.  The cage was removed and it was noted to have fibrous material within it.  There was no bony formation.  Using curettes I removed all of the remaining fibrocartilage and cartilaginous endplate to expose bleeding subchondral bone.  I continued posteriorly until I could pass a 2 mm Kerrison behind the vertebral body of L5 and resect the posterior osteophyte.  Majority of that posterior  annulus and scar tissue from the prior surgery were resected.  At this point under live fluoroscopy I could easily open the intervertebral space with a laminar spreader and confirmed that parallel endplate distraction.  At this point time I used the rasp trial of and elected to use a size 14 extra-large 7 degree lordotic cage.  This provided the best overall restoration of intervertebral height and fixation.  At this point I made a separate small incision over the iliac crest and dissected down to the bone.  Using the iliac crest bone graft harvesting device I managed to get about 6 cc of autograft bone.  The cage was then packed with the autograft.  Despite obtaining the 6 cc of autograft there still remained additional space for more bone graft.  Concerned that I would cause increased trauma to the iliac crest and the potential fracture I elected to supplement with the allograft.  The cage was then [facility] into position.  Using fluoroscopy I confirmed satisfactory overall position.  Locking screws were then placed through the cage and into the vertebral bodies of L5 and L4.  A single screw was placed inferiorly into L5 and 2 superior screws were placed into L4.  All 3 screws had excellent purchase.  At this point I irrigated the wound copiously with normal saline and made sure that hemostasis.  I then remove the retractors sequentially and confirm no active bleeding.  The fascia of the rectus was then reapproximated with a running #1 PDS stitch.  After repeat irrigation of both the abdominal and the iliac crest wound I then closed the remainder the room in a layered fashion with interrupted 0 Vicryl suture, 2-0 Vicryl suture, and a 3-0 Monocryl.  Steri-Strips and dry dressings were applied.  AP digital x-ray was taken and read by the radiologist to confirm there were no retained surgical instruments in the wound.  At this point time the [facility] spine frame was secured over the patient and he was rotated into  the prone position.  The arms were placed over head and 90/90 position and all bony prominences were padded.  A second timeout was done to confirm patient procedure and all other important data.  The back was then prepped and draped in a standard fashion.  Using AP fluoroscopy I marked out the lateral borders of the L4 and L5 pedicles.  I infiltrated the area with quarter percent Marcaine with epinephrine small incision was made and the Jamshidi needle was advanced down to the lateral aspect of the pedicle.  Patient had prior pedicle screws same which were removed but I could not easily palpate the pre-existing pedicle screw tract.  As such I elected to create a new pedicle screw tract.  I advanced the Jamshidi needle using fluoroscopy as well as direct neural stimulation.  As I neared the medial wall of the pedicle on the AP view I switched to the lateral view to confirm that I was beyond the posterior wall of the vertebral body.  Once I confirmed trajectory advanced further into the vertebral body and then placed the guidepin to cannulate  the pedicle.  I repeated this exact same procedure at L5 and on the contralateral side.  Once all 4 pedicles were cannulated I proceeded with the instrumentation.  I placed a 7.5 x 45 mm length screw at L4 and at L5.  As I was attempting to place the L5 screw I noted that there was what appeared to be bioburden in the screw which prevented him from passing over the guidepin.  Since I already placed the L4 pedicle screws I did not want to remove all of the screws since they came from the same tray.  This would create increased potential for instability due to loss of fixation.  As a result I obtained a new screw from a different system and placed the screw.  I then directly stimulated all 4 pedicle screws and there is no adverse activity at greater than 40 mA.  I measured and placed the rod and locking caps.  All 4 locking caps were torqued off according manufacture standards.   The insertion tabs were removed.  I copiously irrigated both wounds with normal saline and then placed tobramycin powder into the wounds to aid in preventing infection.  I then injected the paraspinal muscles with quarter percent Marcaine with Exparel for postoperative analgesia.  The wounds were then closed in layered fashion with interrupted #1 Vicryl, 2-0 Vicryl suture, and 3-0 Monocryl for the skin skin.  Steri-Strips and dry dressings were applied and the patient was ultimately extubated transfer the PACU without incident.  The end of the case all needle and sponge counts were correct.  There were no adverse intraoperative events other than the a forementioned contaminated screw.  Addendum dictation: Upon further inspection after extubation and transferred to the PACU it was noted there was a small piece of plastic in the cannulated screw.  There was no evidence of any bio debris.  As a precaution though we will continue the postoperative antibiotics as per protocol and closely monitor his wound.  Surgical wound classes returned to grade 1.

## 2020-03-29 NOTE — Op Note (Signed)
    OPERATIVE REPORT  DATE OF SURGERY: 03/29/2020  PATIENT: Hector [name], 59 y.o. male MRN: [id number]  DOB: 1961/08/04  PRE-OPERATIVE DIAGNOSIS: Degenerative disc disease  POST-OPERATIVE DIAGNOSIS:  Same  PROCEDURE: Anterior exposure for L4-5 disc fusion  SURGEON:  Curt Jews, M.D.  Co-surgeon for the exposure Dr. Melina Schools  PHYSICIAN ASSISTANT: Cleta Alberts, PA-C  The assistant was needed for exposure and to expedite the case  ANESTHESIA: General  EBL: per anesthesia record  Total I/O In: 1400 [I.V.:1400] Out: 420 [Urine:220; Blood:200]  BLOOD ADMINISTERED: none  DRAINS: none  SPECIMEN: none  COUNTS CORRECT:  YES  PATIENT DISPOSITION:  PACU - hemodynamically stable  PROCEDURE DETAILS: The patient was taken operating placed supine position where the area of the abdomen was prepped draped you sterile fashion.  C-arm was used to mark the level of the L4-5 disc on the anterior abdominal wall.  Incision was made from the midline to the left over this level and carried down through the subcutaneous fat to the anterior rectus sheath.  The anterior rectus sheath was opened in line with the skin incision.  The rectus muscle was mobilized medially.  The retroperitoneal space was entered bluntly.  The peritoneum was not entered.  The intraperitoneal contents and left ureter were mobilized to the right.  The aorta and iliac vessels were mobilized to the right.  Patient had a single large iliolumbar vein and this was ligated and divided.  Blunt dissection over the L4-5 disc was continued to the right.  There was a large intercostal artery and this was clipped and divided.  The NuVasive retractor was brought onto the field and the blades were positioned to the right and left of the L4-5 disc for exposure.  Blades were also placed for superior and inferior exposure.  Spinal needle was placed in the L4-5 disc and C arm was brought back onto the field to confirm this was the appropriate  level.  The remainder of the procedure will be dictated as a separate note by Dr. Lynnea Ferrier, M.D., [facility] 03/29/2020 11:26 AM

## 2020-03-29 NOTE — Transfer of Care (Signed)
Immediate Anesthesia Transfer of Care Note  Patient: Hector [name]  Procedure(s) Performed: ANTERIOR LUMBAR FUSION (ALIF) L4-5 (N/A ) POSTERIOR L4-5 ARTHRODESIS WITH INSTRUMENTATION, ILIAC CREST BONE GRAFT (N/A ) ABDOMINAL EXPOSURE (N/A )  Patient Location: PACU  Anesthesia Type:General  Level of Consciousness: drowsy and patient cooperative  Airway & Oxygen Therapy: Patient Spontanous Breathing and Patient connected to face mask oxygen  Post-op Assessment: Report given to RN and Patient moving all extremities X 4  Post vital signs: Reviewed and stable  Last Vitals:  Vitals Value Taken Time  BP 148/92 03/29/20 1312  Temp    Pulse 77 03/29/20 1313  Resp 19 03/29/20 1313  SpO2 100 % 03/29/20 1313  Vitals shown include unvalidated device data.  Last Pain:  Vitals:   03/29/20 0625  PainSc: 7       Patients Stated Pain Goal: 3 (17/91/50 5697)  Complications: No complications documented.

## 2020-03-29 NOTE — Brief Op Note (Signed)
03/29/2020  12:49 PM  PATIENT:  Hector [name]  59 y.o. male  PRE-OPERATIVE DIAGNOSIS:  Pseudoarthrosis L4-5, prior TLIF  L4-5  POST-OPERATIVE DIAGNOSIS:  Pseudoarthrosis L4-5, prior TLIF  L4-5  PROCEDURE:  Procedure(s) with comments: ANTERIOR LUMBAR FUSION (ALIF) L4-5 (N/A) - 5.5 hrs Dr. Donnetta Hutching do approach Lt tap block with exparel POSTERIOR L4-5 ARTHRODESIS WITH INSTRUMENTATION, ILIAC CREST BONE GRAFT (N/A) ABDOMINAL EXPOSURE (N/A)  SURGEON:  Surgeon(s) and Role: Panel 1:    Melina Schools, MD - Primary Panel 2:    * Early, Arvilla Meres, MD - Primary  PHYSICIAN ASSISTANT:   ASSISTANTS: Amanda Ward, PA   ANESTHESIA:   none  EBL:  250 mL   BLOOD ADMINISTERED:none  DRAINS: none   LOCAL MEDICATIONS USED:  MARCAINE    and OTHER exparel  SPECIMEN:  No Specimen  DISPOSITION OF SPECIMEN:  N/A  COUNTS:  YES  TOURNIQUET:  * No tourniquets in log *  DICTATION: .Dragon Dictation  PLAN OF CARE: Admit to inpatient   PATIENT DISPOSITION:  PACU - hemodynamically stable.

## 2020-03-29 NOTE — Anesthesia Procedure Notes (Signed)
Procedure Name: Intubation Date/Time: 03/29/2020 7:38 AM Performed by: Barrington Ellison, CRNA Pre-anesthesia Checklist: Patient identified, Emergency Drugs available, Suction available and Patient being monitored Patient Re-evaluated:Patient Re-evaluated prior to induction Oxygen Delivery Method: Circle System Utilized Preoxygenation: Pre-oxygenation with 100% oxygen Induction Type: IV induction Ventilation: Mask ventilation without difficulty Laryngoscope Size: Mac and 4 Grade View: Grade I Tube type: Oral Tube size: 7.5 mm Number of attempts: 1 Airway Equipment and Method: Stylet and Oral airway Placement Confirmation: ETT inserted through vocal cords under direct vision,  positive ETCO2 and breath sounds checked- equal and bilateral Secured at: 22 cm Tube secured with: Tape Dental Injury: Teeth and Oropharynx as per pre-operative assessment

## 2020-03-30 ENCOUNTER — Inpatient Hospital Stay (HOSPITAL_COMMUNITY): Payer: Federal, State, Local not specified - PPO

## 2020-03-30 ENCOUNTER — Other Ambulatory Visit: Payer: Self-pay | Admitting: Podiatry

## 2020-03-30 ENCOUNTER — Encounter (HOSPITAL_COMMUNITY): Payer: Self-pay | Admitting: Orthopedic Surgery

## 2020-03-30 DIAGNOSIS — Z9889 Other specified postprocedural states: Secondary | ICD-10-CM

## 2020-03-30 LAB — CBC
HCT: 32.7 % — ABNORMAL LOW (ref 39.0–52.0)
Hemoglobin: 10.7 g/dL — ABNORMAL LOW (ref 13.0–17.0)
MCH: 34 pg (ref 26.0–34.0)
MCHC: 32.7 g/dL (ref 30.0–36.0)
MCV: 103.8 fL — ABNORMAL HIGH (ref 80.0–100.0)
Platelets: 140 10*3/uL — ABNORMAL LOW (ref 150–400)
RBC: 3.15 MIL/uL — ABNORMAL LOW (ref 4.22–5.81)
RDW: 12.6 % (ref 11.5–15.5)
WBC: 6.9 10*3/uL (ref 4.0–10.5)
nRBC: 0 % (ref 0.0–0.2)

## 2020-03-30 LAB — CREATININE, SERUM
Creatinine, Ser: 1.33 mg/dL — ABNORMAL HIGH (ref 0.61–1.24)
GFR calc Af Amer: >60 mL/min (ref >60)
GFR calc non Af Amer: 59 mL/min — ABNORMAL LOW (ref >60)

## 2020-03-30 MED ORDER — ENOXAPARIN (LOVENOX) PATIENT EDUCATION KIT
PACK | Freq: Once | Status: AC
  Filled 2020-03-30: qty 1

## 2020-03-30 MED FILL — Heparin Sodium (Porcine) Inj 1000 Unit/ML: INTRAMUSCULAR | Qty: 30 | Status: AC

## 2020-03-30 MED FILL — Thrombin (Recombinant) For Soln 20000 Unit: CUTANEOUS | Qty: 1 | Status: AC

## 2020-03-30 MED FILL — Sodium Chloride IV Soln 0.9%: INTRAVENOUS | Qty: 1000 | Status: AC

## 2020-03-30 NOTE — Progress Notes (Addendum)
Bilateral lower extremity venous duplex completed. Refer to "CV Proc" under chart review to view preliminary results.  03/30/2020 11:20 AM Kelby Aline., MHA, RVT, RDCS, RDMS

## 2020-03-30 NOTE — Evaluation (Signed)
Occupational Therapy Evaluation Patient Details Name: Hector [name] MRN: [id number] DOB: 06/02/1961 Today's Date: 03/30/2020    History of Present Illness Patient is a 59 y/o male who presents s/p L4-5 ALIF with abdominal exposure 03/29/20. PMH includes back surgery, PE, OSA, HTN, HLD, DM, depression, bipolar, closed head injury.   Clinical Impression   Patient evaluated by Occupational Therapy with no further acute OT needs identified. All education has been completed and the patient has no further questions. See below for any follow-up Occupational Therapy or equipment needs. OT to sign off. Thank you for referral.      Follow Up Recommendations  No OT follow up    Equipment Recommendations  None recommended by OT    Recommendations for Other Services       Precautions / Restrictions Precautions Precautions: Back Precaution Booklet Issued: Yes (comment) Precaution Comments: reviewed adls with back precuations and back handout present in room Required Braces or Orthoses: Spinal Brace Spinal Brace: Lumbar corset;Applied in sitting position Restrictions Weight Bearing Restrictions: No      Mobility Bed Mobility Overal bed mobility: Needs Assistance Bed Mobility: Rolling;Sidelying to Sit Rolling: Modified independent (Device/Increase time) Sidelying to sit: Modified independent (Device/Increase time)       General bed mobility comments: HOB flat, no use of rails to simulate home. Cues for log roll technique. educated on use of pillow for bracing if that helps with abdominal incision  Transfers Overall transfer level: Needs assistance Equipment used: None Transfers: Sit to/from Stand Sit to Stand: Supervision         General transfer comment: Supervision for safety. Stood from [employer].    Balance Overall balance assessment: No apparent balance deficits (not formally assessed)                                         ADL either performed or  assessed with clinical judgement   ADL Overall ADL's : Needs assistance/impaired Eating/Feeding: Modified independent   Grooming: Wash/dry face;Oral care;Supervision/safety;Standing   Upper Body Bathing: Supervision/ safety;Sitting   Lower Body Bathing: Supervison/ safety Lower Body Bathing Details (indicate cue type and reason): plans to use a reacher he has and wife if needed Upper Body Dressing : Supervision/safety   Lower Body Dressing: Supervision/safety Lower Body Dressing Details (indicate cue type and reason): able to figure 4 cross to touch feet during session with back precautions. pt does not achieve great hip flexion but has long UE for reach Toilet Transfer: Supervision/safety       Tub/ Shower Transfer: English as a second language teacher Details (indicate cue type and reason): educated with demonstration using bathroom doorway to simulate. pt able to complete with good recall of placement of RW Functional mobility during ADLs: Supervision/safety;Rolling walker General ADL Comments: pt has 6 dogs at home and reports that they are trained to avoid his path. pt reports the youngest a german shepard is his support dog    Back handout provided and reviewed adls in detail. Pt educated on: clothing between brace, never sleep in brace, set an alarm at night for medication, avoid sitting for long periods of time, correct bed positioning for sleeping, correct sequence for bed mobility, avoiding lifting more than 5 pounds and never wash directly over incision. All education is complete and patient indicates understanding.    Vision Baseline Vision/History: Wears glasses Wears Glasses: At all times  Perception     Praxis      Pertinent Vitals/Pain Pain Assessment: 0-10 Pain Score: 8  Pain Location: abdomen Pain Descriptors / Indicators: Sore;Operative site guarding;Guarding;Grimacing Pain Intervention(s): Monitored during session;Premedicated before  session;Repositioned;Patient requesting pain meds-RN notified     Hand Dominance Right   Extremity/Trunk Assessment Upper Extremity Assessment Upper Extremity Assessment: Overall WFL for tasks assessed   Lower Extremity Assessment Lower Extremity Assessment: Defer to PT evaluation   Cervical / Trunk Assessment Cervical / Trunk Assessment: Other exceptions Cervical / Trunk Exceptions: s/p spine surgery   Communication Communication Communication: No difficulties   Cognition Arousal/Alertness: Awake/alert Behavior During Therapy: WFL for tasks assessed/performed Overall Cognitive Status: Within Functional Limits for tasks assessed                                     General Comments  able to don doff brace, pt reports pain sustained 8 throughout session    Exercises     Shoulder Instructions      Home Living Family/patient expects to be discharged to:: Private residence Living Arrangements: Spouse/significant other Available Help at Discharge: Family;Available 24 hours/day Type of Home: House Home Access: Level entry     Home Layout: One level;Laundry or work area in basement;Able to live on main level with bedroom/bathroom     Bathroom Shower/Tub: Occupational psychologist: Handicapped height     Home Equipment: Other (comment);Bedside commode;Walker - standard;Shower seat - built in;Adaptive equipment;Hand held Architectural technologist: Reacher Additional Comments: knee walker, EVA walker      Prior Functioning/Environment Level of Independence: Independent        Comments: Doing own ADLs and some IADLS, retired, drives. 1 recent fall 2 months ago.        OT Problem List:        OT Treatment/Interventions:      OT Goals(Current goals can be found in the care plan section) Acute Rehab OT Goals Patient Stated Goal: to get this pain under control and go fishing OT Goal Formulation: With patient  OT Frequency:      Barriers to D/C:            Co-evaluation              AM-PAC OT "6 Clicks" Daily Activity     Outcome Measure Help from another person eating meals?: None Help from another person taking care of personal grooming?: None Help from another person toileting, which includes using toliet, bedpan, or urinal?: None Help from another person bathing (including washing, rinsing, drying)?: None Help from another person to put on and taking off regular upper body clothing?: None Help from another person to put on and taking off regular lower body clothing?: None 6 Click Score: 24   End of Session Equipment Utilized During Treatment: Rolling walker;Back brace Nurse Communication: Mobility status;Precautions  Activity Tolerance: Patient tolerated treatment well Patient left: in bed;with call bell/phone within reach  OT Visit Diagnosis: Unsteadiness on feet (R26.81)                Time: 1610-9604 OT Time Calculation (min): 15 min Charges:  OT General Charges $OT Visit: 1 Visit OT Evaluation $OT Eval Moderate Complexity: 1 Mod   Brynn, OTR/L  Acute Rehabilitation Services Pager: [phone] Office: [phone] .   Jeri Modena 03/30/2020, 1:06 PM

## 2020-03-30 NOTE — Evaluation (Signed)
Physical Therapy Evaluation Patient Details Name: Hector [name] MRN: [id number] DOB: 1961/01/25 Today's Date: 03/30/2020   History of Present Illness  Patient is a 59 y/o male who presents s/p L4-5 ALIF with abdominal exposure 03/29/20. PMH includes back surgery, PE, OSA, HTN, HLD, DM, depression, bipolar, closed head injury.  Clinical Impression  Patient presents with pain, decreased activity tolerance and impaired mobility s/p above. Pt independent and lives with wife PTA. Reports hx of 1 fall where his legs "gave out." Today, pt tolerated bed mobility, transfers and gait training with supervision-Mod I for safety. Activity limited today due to worsened pain in abdomen and back this morning. Education re: back precautions, positioning, log roll technique, mobility and brace. Will have support from wife at home. Will follow acutely to maximize independence and mobility prior to return home.    Follow Up Recommendations No PT follow up;Supervision - Intermittent    Equipment Recommendations  None recommended by PT    Recommendations for Other Services       Precautions / Restrictions Precautions Precautions: Back Precaution Booklet Issued: Yes (comment) Precaution Comments: Reviewed handout and precautions Required Braces or Orthoses: Spinal Brace Spinal Brace: Lumbar corset;Applied in sitting position Restrictions Weight Bearing Restrictions: No      Mobility  Bed Mobility Overal bed mobility: Needs Assistance Bed Mobility: Rolling;Sidelying to Sit Rolling: Modified independent (Device/Increase time) Sidelying to sit: Modified independent (Device/Increase time)       General bed mobility comments: HOB flat, no use of rails to simulate home. Cues for log roll technique.  Transfers Overall transfer level: Needs assistance Equipment used: None Transfers: Sit to/from Stand Sit to Stand: Supervision         General transfer comment: Supervision for safety. Stood from [employer].  Ambulation/Gait Ambulation/Gait assistance: Supervision Gait Distance (Feet): 400 Feet Assistive device: Rolling walker (2 wheeled) Gait Pattern/deviations: Step-through pattern;Decreased stride length Gait velocity: decreased Gait velocity interpretation: 1.31 - 2.62 ft/sec, indicative of limited community ambulator General Gait Details: Slow, guarded but steady gait with use of RW; slightly flexed trunk with cues for upright. Adjusted RW height. Limited by pain. Felt like he needed RW for support today.  Stairs            Wheelchair Mobility    Modified Rankin (Stroke Patients Only)       Balance Overall balance assessment: No apparent balance deficits (not formally assessed)                                           Pertinent Vitals/Pain Pain Assessment: 0-10 Pain Score: 8  (8.5) Pain Location: abdomen/back Pain Descriptors / Indicators: Burning;Sore;Operative site guarding;Guarding;Grimacing Pain Intervention(s): Monitored during session;Repositioned;Limited activity within patient's tolerance;Patient requesting pain meds-RN notified    Home Living Family/patient expects to be discharged to:: Private residence Living Arrangements: Spouse/significant other Available Help at Discharge: Family;Available 24 hours/day Type of Home: House Home Access: Level entry     Home Layout: One level;Laundry or work area in basement;Able to live on main level with bedroom/bathroom Home Equipment: Other (comment);Bedside commode;Walker - standard;Shower seat - built in Additional Comments: knee walker, EVA walker    Prior Function Level of Independence: Independent         Comments: Doing own ADLs and some IADLS, retired, drives. 1 recent fall 2 months ago.     Hand Dominance  Extremity/Trunk Assessment   Upper Extremity Assessment Upper Extremity Assessment: Defer to OT evaluation    Lower Extremity Assessment Lower Extremity  Assessment: Generalized weakness;LLE deficits/detail;RLE deficits/detail (but functional) RLE Sensation: history of peripheral neuropathy LLE Sensation: history of peripheral neuropathy    Cervical / Trunk Assessment Cervical / Trunk Assessment: Other exceptions Cervical / Trunk Exceptions: s/p spine surgery  Communication   Communication: No difficulties  Cognition Arousal/Alertness: Awake/alert Behavior During Therapy: WFL for tasks assessed/performed Overall Cognitive Status: Within Functional Limits for tasks assessed                                        General Comments General comments (skin integrity, edema, etc.): ABle to donn brace sitting EOB without difficulty.    Exercises     Assessment/Plan    PT Assessment Patient needs continued PT services  PT Problem List Decreased strength;Decreased balance;Decreased knowledge of precautions;Pain;Decreased activity tolerance;Impaired sensation;Decreased skin integrity       PT Treatment Interventions DME instruction;Functional mobility training;Therapeutic exercise;Gait training;Therapeutic activities;Neuromuscular re-education;Balance training;Patient/family education    PT Goals (Current goals can be found in the Care Plan section)  Acute Rehab PT Goals Patient Stated Goal: to get this pain under control and go fishing PT Goal Formulation: With patient Time For Goal Achievement: 04/13/20 Potential to Achieve Goals: Good    Frequency Min 5X/week   Barriers to discharge        Co-evaluation               AM-PAC PT "6 Clicks" Mobility  Outcome Measure Help needed turning from your back to your side while in a flat bed without using bedrails?: None Help needed moving from lying on your back to sitting on the side of a flat bed without using bedrails?: None Help needed moving to and from a bed to a chair (including a wheelchair)?: None Help needed standing up from a chair using your arms  (e.g., wheelchair or bedside chair)?: None Help needed to walk in hospital room?: None Help needed climbing 3-5 steps with a railing? : A Little 6 Click Score: 23    End of Session Equipment Utilized During Treatment: Gait belt;Back brace Activity Tolerance: Patient limited by pain Patient left: in bed;with call bell/phone within reach (sitting EOB) Nurse Communication: Mobility status;Patient requests pain meds PT Visit Diagnosis: Pain;Muscle weakness (generalized) (M62.81);Difficulty in walking, not elsewhere classified (R26.2) Pain - part of body:  (abdomen/back)    Time: 0174-9449 PT Time Calculation (min) (ACUTE ONLY): 18 min   Charges:   PT Evaluation $PT Eval Moderate Complexity: 1 Mod          Marisa Severin, PT, DPT Acute Rehabilitation Services Pager [phone] Office [place] 03/30/2020, 8:54 AM

## 2020-03-30 NOTE — Progress Notes (Addendum)
  Progress Note    Apr 01, 2020 8:21 AM 1 Day Post-Op  Subjective:  Says incisional pain not well controlled. ambualting and tolerating diet. Voiding and has a BM.   Vitals:   04/01/2020 0319 04-01-2020 0752  BP: (!) 139/91 134/87  Pulse: 81 83  Resp: 18 17  Temp: 98.4 F (36.9 C) 98.3 F (36.8 C)  SpO2: 97% 100%    Physical Exam: Cardiac:  RRR Lungs:  nonlabored Incisions:  Homey comb dressing intact. No bleeding or hematoma Extremities:  Moves all well Abdomen:  ND, active BS, Soft  CBC    Component Value Date/Time   WBC 6.9 04/01/2020 0639   RBC 3.15 (L) 04-01-2020 0639   HGB 10.7 (L) 04-01-2020 0639   HGB 13.3 09/27/2012 1447   HCT 32.7 (L) 2020-04-01 0639   HCT 38.7 09/27/2012 1447   PLT 140 (L) 2020/04/01 0639   PLT 227 09/27/2012 1447   MCV 103.8 (H) 2020-04-01 0639   MCV 95.8 09/27/2012 1447   MCH 34.0 04-01-20 0639   MCHC 32.7 April 01, 2020 0639   RDW 12.6 April 01, 2020 0639   RDW 12.9 09/27/2012 1447   LYMPHSABS 0.9 01/18/2020 1722   LYMPHSABS 2.3 09/27/2012 1447   MONOABS 0.3 01/18/2020 1722   MONOABS 0.5 09/27/2012 1447   EOSABS 0.0 01/18/2020 1722   EOSABS 0.1 09/27/2012 1447   BASOSABS 0.0 01/18/2020 1722   BASOSABS 0.0 09/27/2012 1447    BMET    Component Value Date/Time   NA 140 03/26/2020 0845   K 3.6 03/26/2020 0845   CL 105 03/26/2020 0845   CO2 27 03/26/2020 0845   GLUCOSE 97 03/26/2020 0845   BUN 9 03/26/2020 0845   CREATININE 1.33 (H) 04-01-2020 0639   CALCIUM 8.8 (L) 03/26/2020 0845   GFRNONAA 59 (L) 01-Apr-2020 0639   GFRAA >60 04-01-20 3149     Intake/Output Summary (Last 24 hours) at 04/01/20 0821 Last data filed at 03/29/2020 1651 Gross per 24 hour  Intake 2480 ml  Output 540 ml  Net 1940 ml    HOSPITAL MEDICATIONS Scheduled Meds: . docusate sodium  100 mg Oral BID  . enoxaparin (LOVENOX) injection  40 mg Subcutaneous Q24H  . escitalopram  20 mg Oral q morning - 10a  . losartan  50 mg Oral Daily   And  .  hydrochlorothiazide  12.5 mg Oral Daily  . pregabalin  75 mg Oral Daily  . sodium chloride flush  3 mL Intravenous Q12H   Continuous Infusions: . lactated ringers     PRN Meds:.acetaminophen **OR** acetaminophen, albuterol, colchicine, HYDROmorphone, menthol-cetylpyridinium **OR** phenol, nitroGLYCERIN, ondansetron **OR** ondansetron (ZOFRAN) IV, polyethylene glycol, sodium chloride flush, sodium phosphate, tamsulosin  Assessment: s/p ALIF with anterior exposure. Stable. + GI function  Plan: -as per primary team -DVT prophylaxis:  Lovenox   Risa Grill, PA-C [facility] [phone] 04-01-2020  8:21 AM   I have examined the patient, reviewed and agree with above.  Up walking in the hall.  Comfortable.  Does report some soreness.  Noninvasive lower extremity Doppler studies negative  Curt Jews, MD 04-01-20 1:31 PM

## 2020-03-30 NOTE — Progress Notes (Signed)
    Subjective: Procedure(s) (LRB): ANTERIOR LUMBAR FUSION (ALIF) L4-5 (N/A) POSTERIOR L4-5 ARTHRODESIS WITH INSTRUMENTATION, ILIAC CREST BONE GRAFT (N/A) ABDOMINAL EXPOSURE (N/A) 1 Day Post-Op  Patient reports pain as 3 on 0-10 scale.  Reports decreased leg pain reports incisional back pain   Positive void Negative bowel movement Positive flatus Negative chest pain or shortness of breath  Objective: Vital signs in last 24 hours: Temp:  [97.6 F (36.4 C)-99.4 F (37.4 C)] 98.4 F (36.9 C) (07/16 0319) Pulse Rate:  [68-104] 81 (07/16 0319) Resp:  [9-19] 18 (07/16 0319) BP: (109-159)/(69-99) 139/91 (07/16 0319) SpO2:  [88 %-100 %] 97 % (07/16 0319)  Intake/Output from previous day: 07/15 0701 - 07/16 0700 In: 2480 [P.O.:480; I.V.:2000] Out: 540 [Urine:290; Blood:250]  Labs: No results for input(s): WBC, RBC, HCT, PLT in the last 72 hours. No results for input(s): NA, K, CL, CO2, BUN, CREATININE, GLUCOSE, CALCIUM in the last 72 hours. No results for input(s): LABPT, INR in the last 72 hours.  Physical Exam: Neurologically intact ABD soft Intact pulses distally Incision: dressing C/D/I and no drainage Compartment soft Body mass index is 24.39 kg/m.   Assessment/Plan: Patient stable  xrays n/a Continue mobilization with physical therapy Continue care  1.  We will obtain vascular study today to rule out lower extremity DVT status post anterior lumbar interbody fusion. 2.  Patient will work with PT/OT. 3.  Possible discharge today or tomorrow morning depending upon results of vascular study and being cleared by PT/OT. 4.  Overall patient is doing exceptionally well. 5.  Patient will be discharged with Lovenox and he already has pain medications as per my partner Dr. Nelva Bush.  Melina Schools, MD [facility] [phone]

## 2020-03-31 NOTE — Progress Notes (Signed)
Subjective: 2 Days Post-Op Procedure(s) (LRB): ANTERIOR LUMBAR FUSION (ALIF) L4-5 (N/A) POSTERIOR L4-5 ARTHRODESIS WITH INSTRUMENTATION, ILIAC CREST BONE GRAFT (N/A) ABDOMINAL EXPOSURE (N/A) Patient seen in rounds for Dr. Rolena Infante this am Patient reports pain as mild. Most of the pain he is experiencing is near the abdominal incision site. Over all doing well. Has been cleared by PT and OT yesterday Ready to go home this am.    Vascular study negative for DVT  Objective: Vital signs in last 24 hours: Temp:  [98.3 F (36.8 C)-100.4 F (38 C)] 98.4 F (36.9 C) (07/17 0530) Pulse Rate:  [62-99] 62 (07/17 0530) Resp:  [16-18] 18 (07/17 0530) BP: (93-134)/(61-87) 96/61 (07/17 0530) SpO2:  [94 %-100 %] 95 % (07/17 0530)  Intake/Output from previous day: 07/16 0701 - 07/17 0700 In: 360 [P.O.:360] Out: 1550 [Urine:1550] Intake/Output this shift: Total I/O In: -  Out: 1550 [Urine:1550]  Recent Labs    03/30/20 0639  HGB 10.7*   Recent Labs    03/30/20 0639  WBC 6.9  RBC 3.15*  HCT 32.7*  PLT 140*   Recent Labs    03/30/20 0639  CREATININE 1.33*   No results for input(s): LABPT, INR in the last 72 hours.  Neurologically intact ABD soft Neurovascular intact Sensation intact distally Intact pulses distally Incision: dressing C/D/I   Assessment/Plan: 2 Days Post-Op Procedure(s) (LRB): ANTERIOR LUMBAR FUSION (ALIF) L4-5 (N/A) POSTERIOR L4-5 ARTHRODESIS WITH INSTRUMENTATION, ILIAC CREST BONE GRAFT (N/A) ABDOMINAL EXPOSURE (N/A) Advance diet Discharge home with home health with wife DVT prophylaxis: Lovenox Pain medications per Dr Nelva Bush  Will follow up in the office with Dr. Rolena Infante He has been cleared for discharge by PT/OT     Drue Novel, PA-C [facility] [phone] 03/31/2020, 6:38 AM

## 2020-03-31 NOTE — Progress Notes (Signed)
Discharged instructions/education/AVS/Rx given to patient and verbalized understanding. Patient ambulating well. Pain is mild to moderate per patient. Voiding and emptying bladder well. Incision sites clean and dry and dressing intact. No drainage, no swelling noted.Bowel sounds active and patient had BM yesterday. Patient awaiting for transport.

## 2020-03-31 NOTE — Progress Notes (Signed)
Physical Therapy Treatment Patient Details Name: Hector [name] MRN: [id number] DOB: [birth year] Today's Date: 03/31/2020    History of Present Illness Patient is a 59 y/o male who presents s/p L4-5 ALIF with abdominal exposure 03/29/20. PMH includes back surgery, PE, OSA, HTN, HLD, DM, depression, bipolar, closed head injury.    PT Comments    Pt continuing to progress well towards achieving his current functional mobility goals. PT provided pt education re: back precautions, car transfers and a generalized walking program for pt to initiate upon d/c home. All questions were answered at the end of the session. Plan is to d/c home today with wife's support.     Follow Up Recommendations  No PT follow up;Supervision - Intermittent     Equipment Recommendations  None recommended by PT    Recommendations for Other Services       Precautions / Restrictions Precautions Precautions: Back Precaution Comments: pt able to recall 3/3 back precautions Required Braces or Orthoses: Spinal Brace Spinal Brace: Lumbar corset;Applied in sitting position Restrictions Weight Bearing Restrictions: No    Mobility  Bed Mobility Overal bed mobility: Needs Assistance Bed Mobility: Rolling;Sidelying to Sit Rolling: Supervision Sidelying to sit: Supervision       General bed mobility comments: good technique utilized  Transfers Overall transfer level: Needs assistance Equipment used: None Transfers: Sit to/from Stand Sit to Stand: Supervision         General transfer comment: Supervision for safety. Stood from [employer].  Ambulation/Gait Ambulation/Gait assistance: Supervision Gait Distance (Feet): 500 Feet Assistive device: None Gait Pattern/deviations: Step-through pattern;Decreased stride length Gait velocity: decreased   General Gait Details: pt with slow, steady gait in hallway without use of an AD or need for UE supports; supervision for safety, no LOB   Stairs              Wheelchair Mobility    Modified Rankin (Stroke Patients Only)       Balance Overall balance assessment: No apparent balance deficits (not formally assessed)                                          Cognition Arousal/Alertness: Awake/alert Behavior During Therapy: WFL for tasks assessed/performed Overall Cognitive Status: Within Functional Limits for tasks assessed                                        Exercises      General Comments        Pertinent Vitals/Pain Pain Assessment: 0-10 Pain Score: 6  Pain Location: back Pain Descriptors / Indicators: Sore Pain Intervention(s): Monitored during session;Repositioned    Home Living                      Prior Function            PT Goals (current goals can now be found in the care plan section) Acute Rehab PT Goals PT Goal Formulation: With patient Time For Goal Achievement: 04/13/20 Potential to Achieve Goals: Good Progress towards PT goals: Progressing toward goals    Frequency    Min 5X/week      PT Plan Current plan remains appropriate    Co-evaluation              AM-PAC PT "  6 Clicks" Mobility   Outcome Measure  Help needed turning from your back to your side while in a flat bed without using bedrails?: None Help needed moving from lying on your back to sitting on the side of a flat bed without using bedrails?: None Help needed moving to and from a bed to a chair (including a wheelchair)?: None Help needed standing up from a chair using your arms (e.g., wheelchair or bedside chair)?: None Help needed to walk in hospital room?: None Help needed climbing 3-5 steps with a railing? : A Little 6 Click Score: 23    End of Session Equipment Utilized During Treatment: Back brace Activity Tolerance: Patient tolerated treatment well Patient left: with call bell/phone within reach;with family/visitor present;Other (comment) (seated EOB) Nurse  Communication: Mobility status PT Visit Diagnosis: Pain;Muscle weakness (generalized) (M62.81);Difficulty in walking, not elsewhere classified (R26.2) Pain - part of body:  (back)     Time: 1537-9432 PT Time Calculation (min) (ACUTE ONLY): 11 min  Charges:  $Gait Training: 8-22 mins                     Anastasio Champion, DPT  Acute Rehabilitation Services Pager [phone] Office [place] 03/31/2020, 9:57 AM

## 2020-04-03 NOTE — Discharge Summary (Signed)
Patient ID: Hector [name] MRN: [id number] DOB/AGE: 04-07-1961 59 y.o.  Admit date: 03/29/2020 Discharge date: 04/03/2020  Admission Diagnoses:  Active Problems:   Fusion of lumbar spine   Discharge Diagnoses:  Active Problems:   Fusion of lumbar spine  status post Procedure(s): ANTERIOR LUMBAR FUSION (ALIF) L4-5 POSTERIOR L4-5 ARTHRODESIS WITH INSTRUMENTATION, ILIAC CREST BONE GRAFT ABDOMINAL EXPOSURE  Past Medical History:  Diagnosis Date  . Abnormal LFTs    improved after gastic bypass  . ADJ DISORDER WITH MIXED ANXIETY & DEPRESSED MOOD 08/23/2009   Qualifier: Diagnosis of  By: [name] MD, Standley Brooking Now on lamictal and lexapro and elevil for sleep.   [name] Anxiety   . Ascending aortic aneurysm ([place])    a. 06/2018 CT: 4.1cm.  . B12 nutritional deficiency   . Bipolar disorder ([place])   . Chest pain    a. 2007 MV: No ischemia; b. 09/2018 recurrent c/p in setting of PE; c. 09/2018 MV: No ischemia/infarct. EF 55-65%.  . Closed head injury    laceration  9 12  no loc  vision change  . DDD (degenerative disc disease)   . Depression   . Diabetes mellitus    resolved with gastric bypass  . GERD (gastroesophageal reflux disease)    no meds  . Gout   . Headache    once per week uses Indomethicin hx of cluster headaches   . History of bunionectomy of left great toe   . History of concussion   . History of echocardiogram    a. 09/2018 Echo: Ef 65-70%, no rwma, mild MR. Ao root 62mm (mildly dil).  . History of kidney stones   . History of renal stone    Seen in emergency room not urologist  . Hyperlipidemia   . Hypertension 02/27/2017  . Neuromuscular disorder ([place])   . Nonspecific pain in the lumbar region   . OBESITY 12/10/2007   a. s/p gastric bypass.  . OSA (obstructive sleep apnea) 12/12/2013   resolved by weight loss surgery 10/26/2009  . Osteoarthritis of knee   . Pain in left knee   . Pancreatitis 03/21/2016   went to [place]  . Perforated ulcer ([place])    Surgical  repair9/24 2011  . Peripheral neuropathy   . PONV (postoperative nausea and vomiting)   . Pulmonary embolism ([place])    a. 09/18/2018 CTA Chest: RLL segmental arterial branch small volume PE-->Eliquis.    Surgeries: Procedure(s): ANTERIOR LUMBAR FUSION (ALIF) L4-5 POSTERIOR L4-5 ARTHRODESIS WITH INSTRUMENTATION, ILIAC CREST BONE GRAFT ABDOMINAL EXPOSURE on 03/29/2020   Consultants: Treatment Team:  Rosetta Posner, MD  Discharged Condition: Improved  Hospital Course: Hector [name] is an 59 y.o. male who was admitted 03/29/2020 for operative treatment of Pseudoarthrosis L4-5, prior TLIF  L4-5. Patient failed conservative treatments (please see the history and physical for the specifics) and had severe unremitting pain that affects sleep, daily activities and work/hobbies. After pre-op clearance, the patient was taken to the operating room on 03/29/2020 and underwent  Procedure(s): ANTERIOR LUMBAR FUSION (ALIF) L4-5 POSTERIOR L4-5 ARTHRODESIS WITH INSTRUMENTATION, ILIAC CREST BONE GRAFT ABDOMINAL EXPOSURE.    Patient was given perioperative antibiotics:  Anti-infectives (From admission, onward)   Start     Dose/Rate Route Frequency Ordered Stop   03/29/20 1900  ceFAZolin (ANCEF) IVPB 1 g/50 mL premix        1 g 100 mL/hr over 30 Minutes Intravenous Every 8 hours 03/29/20 1428 03/30/20 0420   03/29/20 1214  tobramycin (NEBCIN) powder  Status:  Discontinued          As needed 03/29/20 1215 03/29/20 1306   03/29/20 0559  ceFAZolin (ANCEF) IVPB 2g/100 mL premix        2 g 200 mL/hr over 30 Minutes Intravenous 30 min pre-op 03/29/20 0559 03/29/20 1131       Patient was given sequential compression devices and early ambulation to prevent DVT.   Patient benefited maximally from hospital stay and there were no complications. At the time of discharge, the patient was urinating/moving their bowels without difficulty, tolerating a regular diet, pain is controlled with oral pain medications and they  have been cleared by PT/OT.   Recent vital signs: No data found.   Recent laboratory studies: No results for input(s): WBC, HGB, HCT, PLT, NA, K, CL, CO2, BUN, CREATININE, GLUCOSE, INR, CALCIUM in the last 72 hours.  Invalid input(s): PT, 2   Discharge Medications:   Allergies as of 03/31/2020      Reactions   Aspirin Other (See Comments)   perforated ulcer   Cymbalta [duloxetine Hcl] Other (See Comments)   Over-sedating; slept over 24 hours with one dose.  Possible serotonin syndrome.   Tape Rash   Surgical tape   Nsaids Other (See Comments)   Perforated ulcer   Hydrocodone Other (See Comments)   headache   Vancomycin Rash   Red man syndrome.  Please premedicate.      Medication List    STOP taking these medications   acetaminophen 325 MG tablet Commonly known as: TYLENOL   celecoxib 200 MG capsule Commonly known as: CELEBREX   indomethacin 50 MG capsule Commonly known as: INDOCIN   methocarbamol 500 MG tablet Commonly known as: ROBAXIN   multivitamin tablet   omeprazole 20 MG capsule Commonly known as: PRILOSEC   oxyCODONE-acetaminophen 5-325 MG tablet Commonly known as: PERCOCET/ROXICET   sucralfate 1 g tablet Commonly known as: CARAFATE   vitamin B-12 1000 MCG tablet Commonly known as: CYANOCOBALAMIN   Vitamin D-1000 Max St 25 MCG (1000 UT) tablet Generic drug: Cholecalciferol     TAKE these medications   albuterol 108 (90 Base) MCG/ACT inhaler Commonly known as: VENTOLIN HFA Inhale 2 puffs into the lungs every 6 (six) hours as needed for wheezing or shortness of breath.   allopurinol 100 MG tablet Commonly known as: ZYLOPRIM TAKE ONE TABLET BY MOUTH DAILY   diazepam 5 MG tablet Commonly known as: VALIUM Take 5 mg by mouth every 6 (six) hours as needed for anxiety.   Dilaudid 8 MG tablet Generic drug: HYDROmorphone Take 8 mg by mouth 4 (four) times daily as needed for severe pain.   E-Z Spacer inhaler Use as instructed   enoxaparin 40  MG/0.4ML injection Commonly known as: LOVENOX Inject 0.4 mLs (40 mg total) into the skin daily. 10 day supply 1 injection per day   escitalopram 20 MG tablet Commonly known as: LEXAPRO Take 20 mg by mouth every morning.   lamoTRIgine 100 MG tablet Commonly known as: LAMICTAL Take 150 mg by mouth 2 (two) times daily.   losartan-hydrochlorothiazide 50-12.5 MG tablet Commonly known as: HYZAAR Take 1 tablet by mouth daily.   nitroGLYCERIN 0.3 MG SL tablet Commonly known as: Nitrostat Place 1 tablet (0.3 mg total) under the tongue every 5 (five) minutes as needed for chest pain.   ondansetron 4 MG tablet Commonly known as: Zofran Take 1 tablet (4 mg total) by mouth every 8 (eight) hours as needed for  nausea or vomiting.   pregabalin 75 MG capsule Commonly known as: LYRICA Take 75 mg by mouth daily.   tamsulosin 0.4 MG Caps capsule Commonly known as: FLOMAX Take 0.4 mg by mouth daily as needed (kidney stones).   traZODone 100 MG tablet Commonly known as: DESYREL Take 300 mg by mouth at bedtime.       Diagnostic Studies: DG Chest 2 View  Result Date: 03/29/2020 CLINICAL DATA:  59 year old male under preoperative evaluation. EXAM: CHEST - 2 VIEW COMPARISON:  Chest x-ray 03/27/2020. FINDINGS: Lung volumes are normal. No consolidative airspace disease. No pleural effusions. No pneumothorax. No pulmonary nodule or mass noted. Pulmonary vasculature and the cardiomediastinal silhouette are within normal limits. Atherosclerotic calcifications in the thoracic aorta. Prosthetic discs are noted projecting over the lower cervical region. IMPRESSION: 1.  No radiographic evidence of acute cardiopulmonary disease. 2. Aortic atherosclerosis. Electronically Signed   By: Vinnie Langton M.D.   On: 03/29/2020 08:02   DG Chest 2 View  Result Date: 03/27/2020 CLINICAL DATA:  Preoperative examination. Patient for lumbar surgery 03/29/2020. EXAM: CHEST - 2 VIEW COMPARISON:  PA and lateral chest  07/30/2019. FINDINGS: The lungs are clear. Heart size is normal. No pneumothorax or pleural fluid. No acute or focal bony abnormality. IMPRESSION: Negative chest. Electronically Signed   By: Inge Rise M.D.   On: 03/27/2020 13:15   DG Lumbar Spine 2-3 Views  Result Date: 03/29/2020 CLINICAL DATA:  Surgery, elective. Additional history provided: L4-L5 ALIF, fluoroscopy time 3 minutes, 12 seconds, 91.58 mGy. EXAM: LUMBAR SPINE - 2-3 VIEW; DG C-ARM 1-60 MIN COMPARISON:  Lumbar spine MRI 01/18/2020 FINDINGS: Spinal numbering will remain consistent with that utilized on prior lumbar spine MRI 01/18/2020. Three intraoperative fluoroscopic images of the lumbar spine are submitted for interpretation. At the L4-L5 level, there is sequela of interval ALIF with interbody spacer and screws extending into the L4 superior endplate and L5 inferior endplate. A posterior spinal fusion construct is also now present at this level. No unexpected finding. IMPRESSION: Three intraoperative fluoroscopic images of the lumbar spine as described. Electronically Signed   By: Kellie Simmering DO   On: 03/29/2020 13:24   DG C-Arm 1-60 Min  Result Date: 03/29/2020 CLINICAL DATA:  Surgery, elective. Additional history provided: L4-L5 ALIF, fluoroscopy time 3 minutes, 12 seconds, 91.58 mGy. EXAM: LUMBAR SPINE - 2-3 VIEW; DG C-ARM 1-60 MIN COMPARISON:  Lumbar spine MRI 01/18/2020 FINDINGS: Spinal numbering will remain consistent with that utilized on prior lumbar spine MRI 01/18/2020. Three intraoperative fluoroscopic images of the lumbar spine are submitted for interpretation. At the L4-L5 level, there is sequela of interval ALIF with interbody spacer and screws extending into the L4 superior endplate and L5 inferior endplate. A posterior spinal fusion construct is also now present at this level. No unexpected finding. IMPRESSION: Three intraoperative fluoroscopic images of the lumbar spine as described. Electronically Signed   By: Kellie Simmering DO   On: 03/29/2020 13:24   VAS [place] LOWER EXTREMITY VENOUS (DVT)  Result Date: 03/31/2020  Lower Venous DVTStudy Indications: Post spinal surgery.  Comparison Study: No prior study Performing Technologist: Maudry Mayhew MHA, RDMS, RVT, RDCS  Examination Guidelines: A complete evaluation includes B-mode imaging, spectral Doppler, color Doppler, and power Doppler as needed of all accessible portions of each vessel. Bilateral testing is considered an integral part of a complete examination. Limited examinations for reoccurring indications may be performed as noted. The reflux portion of the exam is performed with the patient in  reverse Trendelenburg.  +---------+---------------+---------+-----------+----------+--------------+ RIGHT    CompressibilityPhasicitySpontaneityPropertiesThrombus Aging +---------+---------------+---------+-----------+----------+--------------+ CFV      Full           Yes      Yes                                 +---------+---------------+---------+-----------+----------+--------------+ SFJ      Full                                                        +---------+---------------+---------+-----------+----------+--------------+ FV Prox  Full                                                        +---------+---------------+---------+-----------+----------+--------------+ FV Mid   Full                                                        +---------+---------------+---------+-----------+----------+--------------+ FV DistalFull                                                        +---------+---------------+---------+-----------+----------+--------------+ PFV      Full                                                        +---------+---------------+---------+-----------+----------+--------------+ POP      Full           Yes      Yes                                  +---------+---------------+---------+-----------+----------+--------------+ PTV      Full                                                        +---------+---------------+---------+-----------+----------+--------------+ PERO     Full                                                        +---------+---------------+---------+-----------+----------+--------------+   +---------+---------------+---------+-----------+----------+--------------+ LEFT     CompressibilityPhasicitySpontaneityPropertiesThrombus Aging +---------+---------------+---------+-----------+----------+--------------+ CFV      Full           Yes      Yes                                 +---------+---------------+---------+-----------+----------+--------------+  SFJ      Full                                                        +---------+---------------+---------+-----------+----------+--------------+ FV Prox  Full                                                        +---------+---------------+---------+-----------+----------+--------------+ FV Mid   Full                                                        +---------+---------------+---------+-----------+----------+--------------+ FV DistalFull                                                        +---------+---------------+---------+-----------+----------+--------------+ PFV      Full                                                        +---------+---------------+---------+-----------+----------+--------------+ POP      Full           Yes      Yes                                 +---------+---------------+---------+-----------+----------+--------------+ PTV      Full                                                        +---------+---------------+---------+-----------+----------+--------------+ PERO     Full                                                         +---------+---------------+---------+-----------+----------+--------------+     Summary: RIGHT: - There is no evidence of deep vein thrombosis in the lower extremity.  - No cystic structure found in the popliteal fossa.  LEFT: - There is no evidence of deep vein thrombosis in the lower extremity.  - No cystic structure found in the popliteal fossa.  *See table(s) above for measurements and observations. Electronically signed by Ruta Hinds MD on 03/31/2020 at 12:33:02 PM.    Final    DG OR LOCAL ABDOMEN  Result Date: 03/29/2020 CLINICAL DATA:  Instrument count, radiation safety time-out. EXAM: OR LOCAL ABDOMEN COMPARISON:  10/29/2017 FINDINGS: Anterior lumbar interbody fixator noted. Vascular clips project  over the upper sacrum as before. Wedge resections staple line in the left upper quadrant. No radiodensities characteristic for sponge or retained unexpected foreign body identified. There is evidence of lower lumbar spondylosis. Chronic pubic diastasis noted. IMPRESSION: 1. No retained unexpected foreign body in the pelvis. 2. Lumbar spondylosis and anterior lumbar interbody fixator noted. 3. Wedge resections staple line in the left upper quadrant. 4. Postoperative findings at L4-5. 5. Chronic pubic diastasis. These results were called by telephone at the time of interpretation on 03/29/2020 at 10:33 am to [facility] operating room 4, personnel verbally acknowledged these results. Electronically Signed   By: Van Clines M.D.   On: 03/29/2020 10:34    Discharge Instructions    Call MD / Call 911   Complete by: As directed    If you experience chest pain or shortness of breath, CALL 911 and be transported to the hospital emergency room.  If you develope a fever above 101 F, pus (white drainage) or increased drainage or redness at the wound, or calf pain, call your surgeon's office.   Constipation Prevention   Complete by: As directed    Drink plenty of fluids.  Prune juice may be helpful.  You  may use a stool softener, such as Colace (over the counter) 100 mg twice a day.  Use MiraLax (over the counter) for constipation as needed.   Diet - low sodium heart healthy   Complete by: As directed    Incentive spirometry RT   Complete by: As directed    Increase activity slowly as tolerated   Complete by: As directed        Follow-up Information    Melina Schools, MD. Schedule an appointment as soon as possible for a visit in 2 weeks.   Specialty: Orthopedic Surgery Why: If symptoms worsen, For suture removal, For wound re-check Contact information: [address] [place] [address] [phone]               Discharge Plan:  discharge to home  Disposition: stable    Signed: Yvonne Kendall Yazeed Pryer for [facility] PA-C [facility] [phone] 04/03/2020, 1:29 PM

## 2020-04-16 ENCOUNTER — Encounter (HOSPITAL_COMMUNITY): Payer: Self-pay | Admitting: Orthopedic Surgery

## 2020-04-26 ENCOUNTER — Ambulatory Visit: Payer: Federal, State, Local not specified - PPO | Admitting: Podiatry

## 2020-04-26 ENCOUNTER — Other Ambulatory Visit: Payer: Self-pay

## 2020-04-26 ENCOUNTER — Ambulatory Visit (INDEPENDENT_AMBULATORY_CARE_PROVIDER_SITE_OTHER): Payer: Federal, State, Local not specified - PPO

## 2020-04-26 ENCOUNTER — Other Ambulatory Visit: Payer: Self-pay | Admitting: Podiatry

## 2020-04-26 ENCOUNTER — Encounter: Payer: Self-pay | Admitting: Podiatry

## 2020-04-26 DIAGNOSIS — S99921A Unspecified injury of right foot, initial encounter: Secondary | ICD-10-CM | POA: Diagnosis not present

## 2020-04-26 DIAGNOSIS — M778 Other enthesopathies, not elsewhere classified: Secondary | ICD-10-CM | POA: Diagnosis not present

## 2020-04-26 MED ORDER — MELOXICAM 15 MG PO TABS
15.0000 mg | ORAL_TABLET | Freq: Every day | ORAL | 0 refills | Status: DC
Start: 2020-04-26 — End: 2020-05-23

## 2020-04-27 ENCOUNTER — Encounter: Payer: Self-pay | Admitting: Podiatry

## 2020-04-27 NOTE — Progress Notes (Signed)
Subjective:  Patient ID: Hector [name], male    DOB: 11-03-1960,  MRN: [id number]  Chief Complaint  Patient presents with  . Foot Pain    Patient presents for possible gout flare up of right foot.  He states "I fell about 3 weeks ago and my toes bent back, but the top of my foot has been hurting now and swelling at the top base of my toes"    59 y.o. male presents with the above complaint.  Patient presents with a pain on the dorsal aspect of the foot.  Patient has a history of gout for which I treated him for ankle gout in the past.  However patient states that this pain is different does not feel similar.  Patient was going to go lift something where he had to Hyperflex his toes and ended up causing pain right afterwards.  Patient states that that could be likely etiology of it.  Is been going for about 3 weeks ago has not gotten any better.  Is been hurting it since it turned his toes backward.  He denies any other acute complaints.   Review of Systems: Negative except as noted in the HPI. Denies N/V/F/Ch.  Past Medical History:  Diagnosis Date  . Abnormal LFTs    improved after gastic bypass  . ADJ DISORDER WITH MIXED ANXIETY & DEPRESSED MOOD 08/23/2009   Qualifier: Diagnosis of  By: [name] MD, Standley Brooking Now on lamictal and lexapro and elevil for sleep.   [name] Anxiety   . Ascending aortic aneurysm ([place])    a. 06/2018 CT: 4.1cm.  . B12 nutritional deficiency   . Bipolar disorder ([place])   . Chest pain    a. 2007 MV: No ischemia; b. 09/2018 recurrent c/p in setting of PE; c. 09/2018 MV: No ischemia/infarct. EF 55-65%.  . Closed head injury    laceration  9 12  no loc  vision change  . DDD (degenerative disc disease)   . Depression   . Diabetes mellitus    resolved with gastric bypass  . GERD (gastroesophageal reflux disease)    no meds  . Gout   . Headache    once per week uses Indomethicin hx of cluster headaches   . History of bunionectomy of left great toe   . History of concussion     . History of echocardiogram    a. 09/2018 Echo: Ef 65-70%, no rwma, mild MR. Ao root 2mm (mildly dil).  . History of kidney stones   . History of renal stone    Seen in emergency room not urologist  . Hyperlipidemia   . Hypertension 02/27/2017  . Neuromuscular disorder ([place])   . Nonspecific pain in the lumbar region   . OBESITY 12/10/2007   a. s/p gastric bypass.  . OSA (obstructive sleep apnea) 12/12/2013   resolved by weight loss surgery 10/26/2009  . Osteoarthritis of knee   . Pain in left knee   . Pancreatitis 03/21/2016   went to [place]  . Perforated ulcer ([place])    Surgical repair9/24 2011  . Peripheral neuropathy   . PONV (postoperative nausea and vomiting)   . Pulmonary embolism ([place])    a. 09/18/2018 CTA Chest: RLL segmental arterial branch small volume PE-->Eliquis.    Current Outpatient Medications:  .  albuterol (PROVENTIL HFA;VENTOLIN HFA) 108 (90 Base) MCG/ACT inhaler, Inhale 2 puffs into the lungs every 6 (six) hours as needed for wheezing or shortness of breath., Disp: 1 Inhaler, Rfl:  0 .  allopurinol (ZYLOPRIM) 100 MG tablet, TAKE ONE TABLET BY MOUTH DAILY (Patient taking differently: Take 100 mg by mouth daily. ), Disp: 30 tablet, Rfl: 5 .  colchicine 0.6 MG tablet, Take 1 tablet (0.6 mg total) by mouth daily as needed (gout)., Disp: 30 tablet, Rfl: 1 .  diazepam (VALIUM) 5 MG tablet, Take 5 mg by mouth every 6 (six) hours as needed for anxiety., Disp: , Rfl:  .  enoxaparin (LOVENOX) 40 MG/0.4ML injection, Inject 0.4 mLs (40 mg total) into the skin daily. 10 day supply 1 injection per day, Disp: 4 mL, Rfl: 0 .  escitalopram (LEXAPRO) 20 MG tablet, Take 20 mg by mouth every morning. , Disp: , Rfl:  .  HYDROmorphone (DILAUDID) 8 MG tablet, Take 8 mg by mouth 4 (four) times daily as needed for severe pain., Disp: , Rfl:  .  lamoTRIgine (LAMICTAL) 100 MG tablet, Take 150 mg by mouth 2 (two) times daily. , Disp: , Rfl:  .  losartan-hydrochlorothiazide (HYZAAR) 50-12.5  MG tablet, Take 1 tablet by mouth daily., Disp: , Rfl:  .  meloxicam (MOBIC) 15 MG tablet, Take 1 tablet (15 mg total) by mouth daily., Disp: 30 tablet, Rfl: 0 .  nitroGLYCERIN (NITROSTAT) 0.3 MG SL tablet, Place 1 tablet (0.3 mg total) under the tongue every 5 (five) minutes as needed for chest pain., Disp: 100 tablet, Rfl: 3 .  ondansetron (ZOFRAN) 4 MG tablet, Take 1 tablet (4 mg total) by mouth every 8 (eight) hours as needed for nausea or vomiting., Disp: 20 tablet, Rfl: 0 .  pregabalin (LYRICA) 75 MG capsule, Take 75 mg by mouth daily. , Disp: , Rfl:  .  Spacer/Aero-Holding Chambers (E-Z SPACER) inhaler, Use as instructed, Disp: 1 each, Rfl: 2 .  tamsulosin (FLOMAX) 0.4 MG CAPS capsule, Take 0.4 mg by mouth daily as needed (kidney stones). , Disp: , Rfl:  .  traZODone (DESYREL) 100 MG tablet, Take 300 mg by mouth at bedtime., Disp: , Rfl:   Social History   Tobacco Use  Smoking Status Never Smoker  Smokeless Tobacco Never Used    Allergies  Allergen Reactions  . Aspirin Other (See Comments)    perforated ulcer  . Cymbalta [Duloxetine Hcl] Other (See Comments)    Over-sedating; slept over 24 hours with one dose.  Possible serotonin syndrome.  . Tape Rash    Surgical tape  . Nsaids Other (See Comments)    Perforated ulcer  . Hydrocodone Other (See Comments)    headache  . Vancomycin Rash    Red man syndrome.  Please premedicate.   Objective:  There were no vitals filed for this visit. There is no height or weight on file to calculate BMI. Constitutional Well developed. Well nourished.  Vascular Dorsalis pedis pulses palpable bilaterally. Posterior tibial pulses palpable bilaterally. Capillary refill normal to all digits.  No cyanosis or clubbing noted. Pedal hair growth normal.  Neurologic Normal speech. Oriented to person, place, and time. Epicritic sensation to light touch grossly present bilaterally.  Dermatologic Nails well groomed and normal in appearance. No  open wounds. No skin lesions.  Orthopedic:  Pain on palpation to the dorsal forefoot.  Pain with resisted dorsiflexion of the digits as well as plantar flexion of the digits.  No pain at the metatarsophalangeal joints of 1 through 5 with range of motion.  No intra-articular pain noted to each respective MPJ.   Radiographs: 3 views of skeletally mature adult foot: No osseous abnormalities noted.  No fractures noted.  No other bony abnormalities noted. Assessment:   1. Injury of right foot, initial encounter   2. Extensor tendinitis of foot    Plan:  Patient was evaluated and treated and all questions answered.  Right foot extensor tendinitis -I explained to the patient the etiology of tendinitis and various treatment options were extensively discussed.  Clinically he is having severe amount of pain therefore I believe patient will benefit from cam boot immobilization to allow the tendons to heal especially after undergoing hyperflexion injury.  Patient agrees with the plan and he will mobilize himself in the cam boot for next 4 weeks. -If there is no improvement will consider doing a steroid injection and a possible MRI evaluation.  No follow-ups on file.

## 2020-05-04 ENCOUNTER — Ambulatory Visit: Payer: Self-pay | Admitting: Orthopedic Surgery

## 2020-05-07 ENCOUNTER — Ambulatory Visit: Payer: Self-pay | Admitting: Orthopedic Surgery

## 2020-05-07 NOTE — H&P (Signed)
Subjective:   For the patient is here today for follow-up of, patient reports the patient is 5 weeks out from their surgery.. For the patient is having, he reports no pain and pain level 4-5/10. For recent pt, he reports none. For the patient, he reports is using a brace/support (lso). wound check- improving. No longer having to pack wound   Patient Active Problem List   Diagnosis Date Noted  . Fusion of lumbar spine 03/29/2020  . Osteoarthritis of knee 11/02/2018  . Chest pain 09/26/2018  . Pulmonary embolism ([place]) 09/24/2018  . Hypertension 02/27/2017  . Acute gastroenteritis 11/24/2016  . Sepsis ([place]) 11/24/2016  . AKI (acute kidney injury) ([place]) 11/24/2016  . Hypokalemia 11/24/2016  . Chronic low back pain 11/24/2016  . Pancreatitis 03/19/2016  . Anxiety 03/03/2016  . Nasal valve collapse 11/26/2015  . Perennial allergic rhinitis 11/26/2015  . Renal calculi   . 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 Type II or unspecified type diabetes mellitus without mention of complication, not stated as uncontrolled 05/15/2014  . Lumbar back pain with radiculopathy affecting left lower extremity 05/15/2014  . Medication management 05/15/2014  . Pre-op evaluation 05/15/2014  . Insufficient treatment with nasal CPAP 04/24/2014  . 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 Hypoxemia 04/24/2014  . OSA (obstructive sleep apnea) 12/12/2013  . 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 History of renal stone   . Colon cancer screening 12/28/2012  . GERD (gastroesophageal reflux disease) 12/28/2012  . Visit for preventive health examination 09/06/2012  . History of gout 09/06/2012  . Easy bruising 03/24/2012  . Headaches due to old head injury 03/24/2012  . Insomnia 03/24/2012  . BACK PAIN WITH RADICULOPATHY 10/07/2010  . Thrombocytopenia ([place]) 05/24/2010  . ADJ DISORDER WITH MIXED ANXIETY & DEPRESSED MOOD 08/23/2009  . BACK PAIN  10/20/2008  . UNS ADVRS EFF OTH RX MEDICINAL&BIOLOGICAL SBSTNC 02/04/2008  . GOUT 08/02/2007  . ASTHMA 07/22/2007  . B12 deficiency 04/06/2007  . CARPAL TUNNEL SYNDROME 04/06/2007   Past Medical History:  Diagnosis Date  . Abnormal LFTs    improved after gastic bypass  . ADJ DISORDER WITH MIXED ANXIETY & DEPRESSED MOOD 08/23/2009   Qualifier: Diagnosis of  By: [name] MD, Standley Brooking Now on lamictal and lexapro and elevil for sleep.   [name] Anxiety   . Ascending aortic aneurysm ([place])    a. 06/2018 CT: 4.1cm.  . B12 nutritional deficiency   . Bipolar disorder ([place])   . Chest pain    a. 2007 MV: No ischemia; b. 09/2018 recurrent c/p in setting of PE; c. 09/2018 MV: No ischemia/infarct. EF 55-65%.  . Closed head injury    laceration  9 12  no loc  vision change  . DDD (degenerative disc disease)   . Depression   . Diabetes mellitus    resolved with gastric bypass  . GERD (gastroesophageal reflux disease)    no meds  . Gout   . Headache    once per week uses Indomethicin hx of cluster headaches   . History of bunionectomy of left great toe   . History of concussion   . History of echocardiogram    a. 09/2018 Echo: Ef 65-70%, no rwma, mild MR. Ao root 8mm (mildly dil).  . History of kidney stones   . History of renal stone    Seen in emergency room not urologist  . Hyperlipidemia   . Hypertension 02/27/2017  . Neuromuscular disorder ([place])   .  Nonspecific pain in the lumbar region   . OBESITY 12/10/2007   a. s/p gastric bypass.  . OSA (obstructive sleep apnea) 12/12/2013   resolved by weight loss surgery 10/26/2009  . Osteoarthritis of knee   . Pain in left knee   . Pancreatitis 03/21/2016   went to [place]  . Perforated ulcer ([place])    Surgical repair9/24 2011  . Peripheral neuropathy   . PONV (postoperative nausea and vomiting)   . Pulmonary embolism ([place])    a. 09/18/2018 CTA Chest: RLL segmental arterial branch small volume PE-->Eliquis.    Past Surgical History:  Procedure  Laterality Date  . ABDOMINAL EXPOSURE N/A 03/29/2020   Procedure: ABDOMINAL EXPOSURE;  Surgeon: Rosetta Posner, MD;  Location: [facility] OR;  Service: Vascular;  Laterality: N/A;  . ANTERIOR LUMBAR FUSION N/A 03/29/2020   Procedure: ANTERIOR LUMBAR FUSION (ALIF) L4-5;  Surgeon: Melina Schools, MD;  Location: [place];  Service: Orthopedics;  Laterality: N/A;  5.5 hrs Dr. Donnetta Hutching do approach Lt tap block with exparel  . BACK SURGERY  2012   Dr. Dorina Hoyer disk  . CARPAL TUNNEL RELEASE  2008   left-ulnar nerve decomp  . carpel tunnel Left   . CHOLECYSTECTOMY N/A 03/21/2016   Procedure: LAPAROSCOPIC CHOLECYSTECTOMY WITH INTRAOPERATIVE CHOLANGIOGRAM;  Surgeon: Ralene Ok, MD;  Location: [facility] ORS;  Service: General;  Laterality: N/A;  . colonscopy     . DIAGNOSTIC LAPAROSCOPY     laparoscopic gastric bypass  . EPIDURAL BLOCK INJECTION     by Dr. Nelva Bush  . FOOT SURGERY     bilat for removal of extra bone   . GASTRIC BYPASS  11/06/09   beginning weight 267  . HARDWARE REMOVAL N/A 05/02/2016   Procedure: Removal of hardware Lumbar five-Sacral one with Metrex;  Surgeon: Kristeen Miss, MD;  Location: [place] [facility]UR[facility] ORS;  Service: Neurosurgery;  Laterality: N/A;  Removal of hardware L5-S1 with Metrex  . HERNIA REPAIR  4166   umbilical  . HIATAL HERNIA REPAIR N/A 01/20/2013   Procedure: LAPAROSCOPIC REPAIR OF INTERNAL HERNIA;  Surgeon: Shann Medal, MD;  Location: [facility];  Service: General;  Laterality: N/A;  . LUMBAR LAMINECTOMY/DECOMPRESSION MICRODISCECTOMY Left 06/07/2014   Procedure: MICRO LUMBAR DECOMPRESSION L5 - S1 ON THE LEFT/L5 FORAMINOTOMY/EXCISION SYNOVIAL CYST  1 LEVEL;  Surgeon: [name], MD;  Location: [facility];  Service: Orthopedics;  Laterality: Left;  [name] MASS EXCISION Right 04/18/2013   Procedure: RIGHT SMALL FINGER SKIN LESION EXCISION;  Surgeon: Jolyn Nap, MD;  Location: [place];  Service: Orthopedics;  Laterality: Right;  . NASAL SEPTOPLASTY W/ TURBINOPLASTY     x 2  .  NEPHROLITHOTOMY Right 11/03/2014   Procedure: RIGHT PERCUTANEOUS NEPHROLITHOTOMY;  Surgeon: Claybon Jabs, MD;  Location: [facility];  Service: Urology;  Laterality: Right;  . SHOULDER ARTHROSCOPY  2014   rt  . surgerical repair of stomach ulcer  06/08/10   per  . TONSILLECTOMY    . UVULOPALATOPHARYNGOPLASTY  2007   with tonsils  . VASECTOMY      Current Outpatient Medications  Medication Sig Dispense Refill Last Dose  . albuterol (PROVENTIL HFA;VENTOLIN HFA) 108 (90 Base) MCG/ACT inhaler Inhale 2 puffs into the lungs every 6 (six) hours as needed for wheezing or shortness of breath. 1 Inhaler 0   . allopurinol (ZYLOPRIM) 100 MG tablet TAKE ONE TABLET BY MOUTH DAILY (Patient taking differently: Take 100 mg by mouth daily. ) 30 tablet 5   . colchicine  0.6 MG tablet Take 1 tablet (0.6 mg total) by mouth daily as needed (gout). 30 tablet 1   . diazepam (VALIUM) 5 MG tablet Take 5 mg by mouth every 6 (six) hours as needed for anxiety.     . enoxaparin (LOVENOX) 40 MG/0.4ML injection Inject 0.4 mLs (40 mg total) into the skin daily. 10 day supply 1 injection per day 4 mL 0   . escitalopram (LEXAPRO) 20 MG tablet Take 20 mg by mouth every morning.      [name] HYDROmorphone (DILAUDID) 8 MG tablet Take 8 mg by mouth 4 (four) times daily as needed for severe pain.     [name] lamoTRIgine (LAMICTAL) 100 MG tablet Take 150 mg by mouth 2 (two) times daily.      [name] losartan-hydrochlorothiazide (HYZAAR) 50-12.5 MG tablet Take 1 tablet by mouth daily.     . meloxicam (MOBIC) 15 MG tablet Take 1 tablet (15 mg total) by mouth daily. 30 tablet 0   . nitroGLYCERIN (NITROSTAT) 0.3 MG SL tablet Place 1 tablet (0.3 mg total) under the tongue every 5 (five) minutes as needed for chest pain. 100 tablet 3   . ondansetron (ZOFRAN) 4 MG tablet Take 1 tablet (4 mg total) by mouth every 8 (eight) hours as needed for nausea or vomiting. 20 tablet 0   . pregabalin (LYRICA) 75 MG capsule Take 75 mg by mouth daily.      [name]  Spacer/Aero-Holding Chambers (E-Z SPACER) inhaler Use as instructed 1 each 2   . tamsulosin (FLOMAX) 0.4 MG CAPS capsule Take 0.4 mg by mouth daily as needed (kidney stones).      . traZODone (DESYREL) 100 MG tablet Take 300 mg by mouth at bedtime.      No current facility-administered medications for this visit.   Allergies  Allergen Reactions  . Aspirin Other (See Comments)    perforated ulcer  . Cymbalta [Duloxetine Hcl] Other (See Comments)    Over-sedating; slept over 24 hours with one dose.  Possible serotonin syndrome.  . Tape Rash    Surgical tape  . Nsaids Other (See Comments)    Perforated ulcer  . Hydrocodone Other (See Comments)    headache  . Vancomycin Rash    Red man syndrome.  Please premedicate.    Social History   Tobacco Use  . Smoking status: Never Smoker  . Smokeless tobacco: Never Used  Substance Use Topics  . Alcohol use: Yes    Comment: occas liquor     Family History  Problem Relation Age of Onset  . Uterine cancer Mother   . Hypertension Mother   . Cervical cancer Mother   . Heart disease Father   . COPD Father   . Seizures Father   . Arthritis Father   . Arthritis Sister   . Diabetes Sister   . Colon cancer Maternal Uncle   . Colon cancer Paternal Aunt   . Diabetes Paternal Aunt   . Heart disease Maternal Grandmother   . Heart disease Maternal Grandfather   . Heart disease Paternal Grandmother   . Heart disease Paternal Grandfather   . Diabetes Paternal Uncle     Review of Systems As stated in HPI  Objective:   Clinical exam: Dayquan returns today following anterior lumbar antibody fusion with posterior pedicle screw fixation.  Pain level/location: He denies any significant pain.  Neuro exam: Intact with no focal neurological deficits. Negative straight leg raise test.  Patient is ambulating with: Normal gait pattern. No  assistive devices.  Incision: All incisions are clean dry and intact well healed except for the iliac crest bone  graft site. There is no active drainage or erythema. Mild exudate and early scar formation noted.  Compartments in the lower extremity are: Soft and nontender  Abdomen soft and nontender. Normal bowel and bladder function.  No shortness of breath or chest pain.  Patient is alert and oriented 3  Assessment:   Keita is now 4 weeks out from a lumbar fusion and overall is doing exceptionally well. He has no radicular leg pain in his quality-of-life is improved. Only active issue is the iliac crest bone graft site. At this point since he has no drainage or erythema or fevers or chills I am going to monitor the wound healing.   Plan:   I will also set up an appointment at the a [facility] wound clinic for further evaluation. If it is still not healing well at the time of his follow-up then we will consider returning to the operating room for I&D and primary closure.

## 2020-05-07 NOTE — Pre-Procedure Instructions (Addendum)
Your procedure is scheduled on Wednesday, August 25th .             Report to [facility] Main Entrance "A" at 1:20 P.M., and check in at the Admitting office.             Call this number if you have problems the morning of surgery:             [phone]  Call [phone] if you have any questions prior to your surgery date Monday-Friday 8am-4pm.               Remember:             Do not eat after midnight the night before your surgery                          Take these medicines the morning of surgery with A SIP OF WATER  allopurinol (ZYLOPRIM) escitalopram (LEXAPRO) lamoTRIgine (LAMICTAL) pregabalin (LYRICA)  Spacer Inhaler  As needed: acetaminophen (TYLENOL) Albuterol inhaler-please bring with you on the day of surgery.     Colchicine diazepam (VALIUM)  ondansetron (ZOFRAN)   tamsulosin (FLOMAX) nitroGLYCERIN (NITROSTAT)-as needed for chest pain. Please inform a nurse if you had to use this.   As of today, STOP taking any Aspirin (unless otherwise instructed by your surgeon) Aleve, Naproxen, Ibuprofen, Motrin, Advil, Goody's, BC's, all herbal medications, fish oil, and all vitamins. This includes: meloxicam (MOBIC).  Do not wear jewelry. Do notwear lotions, powders, colognes, or deodorant. Men may shave face and neck. Do notbring valuables to the hospital. [facility] is not responsible for any belongings or valuables.  Do NOT Smoke (Tobacco/Vaping) or drink Alcohol 24 hours prior to your procedure If you use a CPAP at night, you may bring all equipment for your overnight stay.  Contacts, glasses, dentures or bridgework may not be worn into surgery.    For patients admitted to the hospital, discharge time will be determined by your treatment team.  Patients discharged the day of surgery will not be allowed to drive home, and someone needs to stay with them for 24 hours.    Special  instructions:   [place]- Preparing For Surgery  Before surgery, you can play an important role. Because skin is not sterile, your skin needs to be as free of germs as possible. You can reduce the number of germs on your skin by washing with CHG (chlorahexidine gluconate) Soap before surgery.  CHG is an antiseptic cleaner which kills germs and bonds with the skin to continue killing germs even after washing.    Oral Hygiene is also important to reduce your risk of infection.  Remember - BRUSH YOUR TEETH THE MORNING OF SURGERY WITH YOUR REGULAR TOOTHPASTE  Please do not use if you have an allergy to CHG or antibacterial soaps. If your skin becomes reddened/irritated stop using the CHG.  Do not shave (including legs and underarms) for at least 48 hours prior to first CHG shower. It is OK to shave your face.  Please follow these instructions carefully.  1. Shower the NIGHT BEFORE SURGERY and the MORNING OF SURGERY with CHG Soap.   2. If you chose to wash your hair, wash your hair first as usual with your normal shampoo.  3. After you shampoo, rinse your hair and body thoroughly to remove the shampoo.  4. Use CHG as you would any other liquid soap. You can apply CHG directly to the skin and wash gently with a scrungie or a clean washcloth.   5. Apply the CHG Soap to your body ONLY FROM THE NECK DOWN.  Do not use on open wounds or open sores. Avoid contact with your eyes, ears, mouth and genitals (private parts). Wash Face and genitals (private parts)  with your normal soap.   6. Wash thoroughly, paying special attention to the area where your surgery will be performed.  7. Thoroughly rinse your body with warm water from the neck down.  8. DO NOT shower/wash with your normal soap after using and rinsing off the CHG Soap.  9. Pat yourself dry with a CLEAN  TOWEL.  10. Wear CLEAN PAJAMAS to bed the night before surgery  11. Place CLEAN SHEETS on your bed the night of your first shower and DO NOT SLEEP WITH PETS.   Day of Surgery: Wear Clean/Comfortable clothing the morning of surgery Do notapply any deodorants/lotions.  Remember to brush your teeth WITH YOUR REGULAR TOOTHPASTE.  Please read over the following fact sheets that you were given.

## 2020-05-07 NOTE — H&P (Deleted)
  The note originally documented on this encounter has been moved the the encounter in which it belongs.  

## 2020-05-08 ENCOUNTER — Other Ambulatory Visit: Payer: Self-pay

## 2020-05-08 ENCOUNTER — Other Ambulatory Visit (HOSPITAL_COMMUNITY)
Admission: RE | Admit: 2020-05-08 | Discharge: 2020-05-08 | Disposition: A | Discharge status: Home / Self Care | Payer: Federal, State, Local not specified - PPO | Source: Ambulatory Visit | Attending: Orthopedic Surgery | Admitting: Orthopedic Surgery

## 2020-05-08 ENCOUNTER — Encounter (HOSPITAL_COMMUNITY)
Admission: RE | Admit: 2020-05-08 | Discharge: 2020-05-08 | Disposition: A | Discharge status: Home / Self Care | Payer: Federal, State, Local not specified - PPO | Source: Ambulatory Visit | Attending: Orthopedic Surgery | Admitting: Orthopedic Surgery

## 2020-05-08 DIAGNOSIS — Z01812 Encounter for preprocedural laboratory examination: Secondary | ICD-10-CM | POA: Insufficient documentation

## 2020-05-08 DIAGNOSIS — Z20822 Contact with and (suspected) exposure to covid-19: Secondary | ICD-10-CM | POA: Insufficient documentation

## 2020-05-08 LAB — BASIC METABOLIC PANEL
Anion gap: 9 (ref 5–15)
BUN: 8 mg/dL (ref 6–20)
CO2: 28 mmol/L (ref 22–32)
Calcium: 8.8 mg/dL — ABNORMAL LOW (ref 8.9–10.3)
Chloride: 108 mmol/L (ref 98–111)
Creatinine, Ser: 1.13 mg/dL (ref 0.61–1.24)
GFR calc Af Amer: >60 mL/min (ref >60)
GFR calc non Af Amer: >60 mL/min (ref >60)
Glucose, Bld: 99 mg/dL (ref 70–99)
Potassium: 4 mmol/L (ref 3.5–5.1)
Sodium: 145 mmol/L (ref 135–145)

## 2020-05-08 LAB — CBC
HCT: 38.5 % — ABNORMAL LOW (ref 39.0–52.0)
Hemoglobin: 12 g/dL — ABNORMAL LOW (ref 13.0–17.0)
MCH: 31.9 pg (ref 26.0–34.0)
MCHC: 31.2 g/dL (ref 30.0–36.0)
MCV: 102.4 fL — ABNORMAL HIGH (ref 80.0–100.0)
Platelets: 129 10*3/uL — ABNORMAL LOW (ref 150–400)
RBC: 3.76 MIL/uL — ABNORMAL LOW (ref 4.22–5.81)
RDW: 14 % (ref 11.5–15.5)
WBC: 3.3 10*3/uL — ABNORMAL LOW (ref 4.0–10.5)
nRBC: 0 % (ref 0.0–0.2)

## 2020-05-08 LAB — SARS CORONAVIRUS 2 (TAT 6-24 HRS): SARS Coronavirus 2: NEGATIVE

## 2020-05-08 LAB — GLUCOSE, CAPILLARY: Glucose-Capillary: 88 mg/dL (ref 70–99)

## 2020-05-08 NOTE — Progress Notes (Signed)
PCP - Dr. [name] Cardiologist - denies  Chest x-ray - 03/29/20 EKG - 08/04/19 Stress Test - 09/27/18 ECHO - 09/27/18 Cardiac Cath - denies  Sleep Study - OSA+ prior to weight loss surgery. No longer an issue.  CPAP - denies  Pt states that he was diabetic prior to weight loss surgery and is no longer an issue. CBG at PAT was 88. Will check one time on DOS.  Blood Thinner Instructions:n/a Aspirin Instructions:n/a  ERAS Protcol -n/a PRE-SURGERY Ensure or G2- n/a  COVID TEST- Today after PAT appt. 05/08/20   Anesthesia review: Yes, health history.   Patient denies shortness of breath, fever, cough and chest pain at PAT appointment   All instructions explained to the patient, with a verbal understanding of the material. Patient agrees to go over the instructions while at home for a better understanding. Patient also instructed to self quarantine after being tested for COVID-19. The opportunity to ask questions was provided.   Coronavirus Screening  Have you experienced the following symptoms:  Cough yes/no: No Fever (>100.81F)  yes/no: No Runny nose yes/no: No Sore throat yes/no: No Difficulty breathing/shortness of breath  yes/no: No  Have you or a family member traveled in the last 14 days and where? yes/no: No   If the patient indicates "YES" to the above questions, their PAT will be rescheduled to limit the exposure to others and, the surgeon will be notified. THE PATIENT WILL NEED TO BE ASYMPTOMATIC FOR 14 DAYS.   If the patient is not experiencing any of these symptoms, the PAT nurse will instruct them to NOT bring anyone with them to their appointment since they may have these symptoms or traveled as well.   Please remind your patients and families that hospital visitation restrictions are in effect and the importance of the restrictions.

## 2020-05-08 NOTE — Anesthesia Preprocedure Evaluation (Addendum)
Anesthesia Evaluation  Patient identified by MRN, date of birth, ID band Patient awake    Reviewed: Allergy & Precautions, NPO status , Patient's Chart, lab work & pertinent test results  History of Anesthesia Complications (+) PONV and history of anesthetic complications  Airway Mallampati: II  TM Distance: >3 FB Neck ROM: Full    Dental  (+) Teeth Intact, Dental Advisory Given   Pulmonary sleep apnea ,    breath sounds clear to auscultation       Cardiovascular hypertension, Pt. on medications  Rhythm:Regular Rate:Normal     Neuro/Psych  Headaches, PSYCHIATRIC DISORDERS Anxiety Depression Bipolar Disorder    GI/Hepatic PUD, GERD  ,  Endo/Other  diabetes  Renal/GU      Musculoskeletal   Abdominal Normal abdominal exam  (+)   Peds  Hematology   Anesthesia Other Findings   Reproductive/Obstetrics                           Anesthesia Physical Anesthesia Plan  ASA: III  Anesthesia Plan: General   Post-op Pain Management:    Induction: Intravenous  PONV Risk Score and Plan: Ondansetron and Dexamethasone  Airway Management Planned: LMA  Additional Equipment:   Intra-op Plan:   Post-operative Plan: Extubation in OR  Informed Consent: I have reviewed the patients History and Physical, chart, labs and discussed the procedure including the risks, benefits and alternatives for the proposed anesthesia with the patient or authorized representative who has indicated his/her understanding and acceptance.     Dental advisory given  Plan Discussed with: CRNA and Anesthesiologist  Anesthesia Plan Comments: (See recent PAT note by Karoline Caldwell, PA-C 03/26/20.  Patient subsequently underwent lumbar fusion 03/24/6268 without complication.  No interim health changes noted. )      Anesthesia Quick Evaluation

## 2020-05-09 ENCOUNTER — Ambulatory Visit (HOSPITAL_COMMUNITY): Payer: Federal, State, Local not specified - PPO | Admitting: Anesthesiology

## 2020-05-09 ENCOUNTER — Ambulatory Visit (HOSPITAL_COMMUNITY): Payer: Federal, State, Local not specified - PPO | Admitting: Physician Assistant

## 2020-05-09 ENCOUNTER — Encounter (HOSPITAL_COMMUNITY): Payer: Self-pay | Admitting: Orthopedic Surgery

## 2020-05-09 ENCOUNTER — Ambulatory Visit (HOSPITAL_COMMUNITY)
Admission: RE | Admit: 2020-05-09 | Discharge: 2020-05-09 | Disposition: A | Discharge status: Home / Self Care | Payer: Federal, State, Local not specified - PPO | Attending: Orthopedic Surgery | Admitting: Orthopedic Surgery

## 2020-05-09 ENCOUNTER — Encounter (HOSPITAL_COMMUNITY)
Admission: RE | Disposition: A | Discharge status: Home / Self Care | Payer: Self-pay | Source: Home / Self Care | Attending: Orthopedic Surgery

## 2020-05-09 ENCOUNTER — Other Ambulatory Visit: Payer: Self-pay

## 2020-05-09 DIAGNOSIS — F319 Bipolar disorder, unspecified: Secondary | ICD-10-CM | POA: Diagnosis not present

## 2020-05-09 DIAGNOSIS — E119 Type 2 diabetes mellitus without complications: Secondary | ICD-10-CM | POA: Diagnosis not present

## 2020-05-09 DIAGNOSIS — G4700 Insomnia, unspecified: Secondary | ICD-10-CM | POA: Insufficient documentation

## 2020-05-09 DIAGNOSIS — Z825 Family history of asthma and other chronic lower respiratory diseases: Secondary | ICD-10-CM | POA: Insufficient documentation

## 2020-05-09 DIAGNOSIS — F419 Anxiety disorder, unspecified: Secondary | ICD-10-CM | POA: Insufficient documentation

## 2020-05-09 DIAGNOSIS — Z881 Allergy status to other antibiotic agents status: Secondary | ICD-10-CM | POA: Diagnosis not present

## 2020-05-09 DIAGNOSIS — Z9884 Bariatric surgery status: Secondary | ICD-10-CM | POA: Insufficient documentation

## 2020-05-09 DIAGNOSIS — Z8261 Family history of arthritis: Secondary | ICD-10-CM | POA: Insufficient documentation

## 2020-05-09 DIAGNOSIS — Z886 Allergy status to analgesic agent status: Secondary | ICD-10-CM | POA: Diagnosis not present

## 2020-05-09 DIAGNOSIS — E785 Hyperlipidemia, unspecified: Secondary | ICD-10-CM | POA: Diagnosis not present

## 2020-05-09 DIAGNOSIS — K219 Gastro-esophageal reflux disease without esophagitis: Secondary | ICD-10-CM | POA: Insufficient documentation

## 2020-05-09 DIAGNOSIS — Z87442 Personal history of urinary calculi: Secondary | ICD-10-CM | POA: Insufficient documentation

## 2020-05-09 DIAGNOSIS — J45909 Unspecified asthma, uncomplicated: Secondary | ICD-10-CM | POA: Insufficient documentation

## 2020-05-09 DIAGNOSIS — T8131XA Disruption of external operation (surgical) wound, not elsewhere classified, initial encounter: Secondary | ICD-10-CM | POA: Diagnosis not present

## 2020-05-09 DIAGNOSIS — Z885 Allergy status to narcotic agent status: Secondary | ICD-10-CM | POA: Diagnosis not present

## 2020-05-09 DIAGNOSIS — Z91048 Other nonmedicinal substance allergy status: Secondary | ICD-10-CM | POA: Diagnosis not present

## 2020-05-09 DIAGNOSIS — Z888 Allergy status to other drugs, medicaments and biological substances status: Secondary | ICD-10-CM | POA: Insufficient documentation

## 2020-05-09 DIAGNOSIS — Z86711 Personal history of pulmonary embolism: Secondary | ICD-10-CM | POA: Diagnosis not present

## 2020-05-09 DIAGNOSIS — Z800 Family history of malignant neoplasm of digestive organs: Secondary | ICD-10-CM | POA: Insufficient documentation

## 2020-05-09 DIAGNOSIS — I10 Essential (primary) hypertension: Secondary | ICD-10-CM | POA: Diagnosis not present

## 2020-05-09 DIAGNOSIS — Z79899 Other long term (current) drug therapy: Secondary | ICD-10-CM | POA: Insufficient documentation

## 2020-05-09 DIAGNOSIS — Z981 Arthrodesis status: Secondary | ICD-10-CM | POA: Diagnosis not present

## 2020-05-09 DIAGNOSIS — Z791 Long term (current) use of non-steroidal anti-inflammatories (NSAID): Secondary | ICD-10-CM | POA: Insufficient documentation

## 2020-05-09 DIAGNOSIS — X58XXXA Exposure to other specified factors, initial encounter: Secondary | ICD-10-CM | POA: Diagnosis not present

## 2020-05-09 DIAGNOSIS — Z9049 Acquired absence of other specified parts of digestive tract: Secondary | ICD-10-CM | POA: Insufficient documentation

## 2020-05-09 DIAGNOSIS — G4733 Obstructive sleep apnea (adult) (pediatric): Secondary | ICD-10-CM | POA: Insufficient documentation

## 2020-05-09 DIAGNOSIS — Z833 Family history of diabetes mellitus: Secondary | ICD-10-CM | POA: Insufficient documentation

## 2020-05-09 DIAGNOSIS — E538 Deficiency of other specified B group vitamins: Secondary | ICD-10-CM | POA: Diagnosis not present

## 2020-05-09 DIAGNOSIS — Z8249 Family history of ischemic heart disease and other diseases of the circulatory system: Secondary | ICD-10-CM | POA: Insufficient documentation

## 2020-05-09 DIAGNOSIS — Z8049 Family history of malignant neoplasm of other genital organs: Secondary | ICD-10-CM | POA: Insufficient documentation

## 2020-05-09 DIAGNOSIS — T8132XA Disruption of internal operation (surgical) wound, not elsewhere classified, initial encounter: Secondary | ICD-10-CM | POA: Diagnosis not present

## 2020-05-09 DIAGNOSIS — Z7901 Long term (current) use of anticoagulants: Secondary | ICD-10-CM | POA: Insufficient documentation

## 2020-05-09 DIAGNOSIS — R519 Headache, unspecified: Secondary | ICD-10-CM | POA: Insufficient documentation

## 2020-05-09 DIAGNOSIS — T8140XA Infection following a procedure, unspecified, initial encounter: Secondary | ICD-10-CM | POA: Insufficient documentation

## 2020-05-09 HISTORY — PX: DEBRIDEMENT AND CLOSURE WOUND: SHX5614

## 2020-05-09 LAB — GLUCOSE, CAPILLARY
Glucose-Capillary: 92 mg/dL (ref 70–99)
Glucose-Capillary: 93 mg/dL (ref 70–99)

## 2020-05-09 SURGERY — DEBRIDEMENT, WOUND, WITH CLOSURE
Anesthesia: General | Site: Abdomen

## 2020-05-09 MED ORDER — DEXAMETHASONE SODIUM PHOSPHATE 10 MG/ML IJ SOLN
INTRAMUSCULAR | Status: AC
  Filled 2020-05-09: qty 1

## 2020-05-09 MED ORDER — DEXAMETHASONE SODIUM PHOSPHATE 4 MG/ML IJ SOLN
INTRAMUSCULAR | Status: DC | PRN
  Administered 2020-05-09: 4 mg via INTRAVENOUS

## 2020-05-09 MED ORDER — LIDOCAINE 2% (20 MG/ML) 5 ML SYRINGE
INTRAMUSCULAR | Status: AC
  Filled 2020-05-09: qty 5

## 2020-05-09 MED ORDER — EPHEDRINE 5 MG/ML INJ
INTRAVENOUS | Status: AC
  Filled 2020-05-09: qty 10

## 2020-05-09 MED ORDER — ONDANSETRON HCL 4 MG/2ML IJ SOLN
INTRAMUSCULAR | Status: AC
  Filled 2020-05-09: qty 2

## 2020-05-09 MED ORDER — PROPOFOL 10 MG/ML IV BOLUS
INTRAVENOUS | Status: AC
  Filled 2020-05-09: qty 20

## 2020-05-09 MED ORDER — ONDANSETRON HCL 4 MG/2ML IJ SOLN: 4.0000 mg | Freq: Once | INTRAMUSCULAR | Status: DC | PRN

## 2020-05-09 MED ORDER — FENTANYL CITRATE (PF) 250 MCG/5ML IJ SOLN
INTRAMUSCULAR | Status: AC
  Filled 2020-05-09: qty 5

## 2020-05-09 MED ORDER — BUPIVACAINE-EPINEPHRINE (PF) 0.25% -1:200000 IJ SOLN
INTRAMUSCULAR | Status: AC
  Filled 2020-05-09: qty 30

## 2020-05-09 MED ORDER — BACITRACIN ZINC 500 UNIT/GM EX OINT
TOPICAL_OINTMENT | CUTANEOUS | Status: AC
  Filled 2020-05-09: qty 28.35

## 2020-05-09 MED ORDER — HYDRALAZINE HCL 20 MG/ML IJ SOLN
5.0000 mg | Freq: Once | INTRAMUSCULAR | Status: AC
  Administered 2020-05-09: 5 mg via INTRAVENOUS

## 2020-05-09 MED ORDER — ONDANSETRON HCL 4 MG/2ML IJ SOLN
INTRAMUSCULAR | Status: DC | PRN
  Administered 2020-05-09: 4 mg via INTRAVENOUS

## 2020-05-09 MED ORDER — ONDANSETRON HCL 4 MG PO TABS: 4.0000 mg | ORAL_TABLET | Freq: Three times a day (TID) | ORAL | 0 refills | Status: AC | PRN

## 2020-05-09 MED ORDER — MIDAZOLAM HCL 2 MG/2ML IJ SOLN
INTRAMUSCULAR | Status: AC
  Filled 2020-05-09: qty 2

## 2020-05-09 MED ORDER — CEFAZOLIN SODIUM-DEXTROSE 2-4 GM/100ML-% IV SOLN
2.0000 g | INTRAVENOUS | Status: AC
  Administered 2020-05-09: 2 g via INTRAVENOUS
  Filled 2020-05-09: qty 100

## 2020-05-09 MED ORDER — LIDOCAINE HCL (CARDIAC) PF 100 MG/5ML IV SOSY
PREFILLED_SYRINGE | INTRAVENOUS | Status: DC | PRN
  Administered 2020-05-09: 60 mg via INTRAVENOUS

## 2020-05-09 MED ORDER — PROPOFOL 1000 MG/100ML IV EMUL
INTRAVENOUS | Status: AC
  Filled 2020-05-09: qty 100

## 2020-05-09 MED ORDER — FENTANYL CITRATE (PF) 100 MCG/2ML IJ SOLN
25.0000 ug | INTRAMUSCULAR | Status: DC | PRN
  Administered 2020-05-09 (×2): 50 ug via INTRAVENOUS

## 2020-05-09 MED ORDER — THROMBIN 20000 UNITS EX SOLR
CUTANEOUS | Status: AC
  Filled 2020-05-09: qty 20000

## 2020-05-09 MED ORDER — ORAL CARE MOUTH RINSE: 15.0000 mL | Freq: Once | OROMUCOSAL | Status: AC

## 2020-05-09 MED ORDER — EPHEDRINE SULFATE 50 MG/ML IJ SOLN
INTRAMUSCULAR | Status: DC | PRN
  Administered 2020-05-09: 5 mg via INTRAVENOUS

## 2020-05-09 MED ORDER — FENTANYL CITRATE (PF) 100 MCG/2ML IJ SOLN
INTRAMUSCULAR | Status: AC
  Filled 2020-05-09: qty 2

## 2020-05-09 MED ORDER — ACETAMINOPHEN 650 MG RE SUPP: 650.0000 mg | RECTAL | Status: DC | PRN

## 2020-05-09 MED ORDER — FENTANYL CITRATE (PF) 100 MCG/2ML IJ SOLN: 25.0000 ug | INTRAMUSCULAR | Status: DC | PRN

## 2020-05-09 MED ORDER — PHENOL 1.4 % MT LIQD: 1.0000 | OROMUCOSAL | Status: DC | PRN

## 2020-05-09 MED ORDER — PROPOFOL 10 MG/ML IV BOLUS
INTRAVENOUS | Status: DC | PRN
  Administered 2020-05-09: 200 mg via INTRAVENOUS
  Administered 2020-05-09: 30 mg via INTRAVENOUS

## 2020-05-09 MED ORDER — LACTATED RINGERS IV SOLN: INTRAVENOUS | Status: DC

## 2020-05-09 MED ORDER — CHLORHEXIDINE GLUCONATE 0.12 % MT SOLN
15.0000 mL | Freq: Once | OROMUCOSAL | Status: AC
  Administered 2020-05-09: 15 mL via OROMUCOSAL
  Filled 2020-05-09: qty 15

## 2020-05-09 MED ORDER — MIDAZOLAM HCL 5 MG/5ML IJ SOLN
INTRAMUSCULAR | Status: DC | PRN
  Administered 2020-05-09: 2 mg via INTRAVENOUS

## 2020-05-09 MED ORDER — SODIUM CHLORIDE 0.9% FLUSH: 3.0000 mL | INTRAVENOUS | Status: DC | PRN

## 2020-05-09 MED ORDER — MENTHOL 3 MG MT LOZG: 1.0000 | LOZENGE | OROMUCOSAL | Status: DC | PRN

## 2020-05-09 MED ORDER — ONDANSETRON HCL 4 MG/2ML IJ SOLN: 4.0000 mg | Freq: Four times a day (QID) | INTRAMUSCULAR | Status: DC | PRN

## 2020-05-09 MED ORDER — SODIUM CHLORIDE 0.9 % IV SOLN: 250.0000 mL | INTRAVENOUS | Status: DC

## 2020-05-09 MED ORDER — ACETAMINOPHEN 325 MG PO TABS: 650.0000 mg | ORAL_TABLET | ORAL | Status: DC | PRN

## 2020-05-09 MED ORDER — SODIUM CHLORIDE 0.9 % IR SOLN
Status: DC | PRN
  Administered 2020-05-09: 3000 mL

## 2020-05-09 MED ORDER — SODIUM CHLORIDE 0.9% FLUSH: 3.0000 mL | Freq: Two times a day (BID) | INTRAVENOUS | Status: DC

## 2020-05-09 MED ORDER — FENTANYL CITRATE (PF) 100 MCG/2ML IJ SOLN
INTRAMUSCULAR | Status: DC | PRN
Start: 2020-05-09 — End: 2020-05-09
  Administered 2020-05-09 (×5): 50 ug via INTRAVENOUS

## 2020-05-09 MED ORDER — HYDRALAZINE HCL 20 MG/ML IJ SOLN
INTRAMUSCULAR | Status: AC
  Filled 2020-05-09: qty 1

## 2020-05-09 MED ORDER — ONDANSETRON HCL 4 MG PO TABS: 4.0000 mg | ORAL_TABLET | Freq: Four times a day (QID) | ORAL | Status: DC | PRN

## 2020-05-09 MED ORDER — 0.9 % SODIUM CHLORIDE (POUR BTL) OPTIME
TOPICAL | Status: DC | PRN
  Administered 2020-05-09: 1000 mL

## 2020-05-09 MED ORDER — PHENYLEPHRINE 40 MCG/ML (10ML) SYRINGE FOR IV PUSH (FOR BLOOD PRESSURE SUPPORT)
PREFILLED_SYRINGE | INTRAVENOUS | Status: AC
  Filled 2020-05-09: qty 10

## 2020-05-09 SURGICAL SUPPLY — 63 items
AGENT HMST KT MTR STRL THRMB (HEMOSTASIS)
BUR EGG ELITE 4.0 (BURR) IMPLANT
CANISTER SUCT 3000ML PPV (MISCELLANEOUS) ×2 IMPLANT
CORD BIPOLAR FORCEPS 12FT (ELECTRODE) ×2 IMPLANT
COVER SURGICAL LIGHT HANDLE (MISCELLANEOUS) ×2 IMPLANT
COVER WAND RF STERILE (DRAPES) ×2 IMPLANT
DRAPE HALF SHEET 40X57 (DRAPES) ×2 IMPLANT
DRAPE POUCH INSTRU U-SHP 10X18 (DRAPES) ×2 IMPLANT
DRAPE SURG 17X23 STRL (DRAPES) ×2 IMPLANT
DRAPE U-SHAPE 47X51 STRL (DRAPES) ×2 IMPLANT
DRSG AQUACEL AG ADV 3.5X 6 (GAUZE/BANDAGES/DRESSINGS) ×2 IMPLANT
DRSG AQUACEL AG ADV 3.5X10 (GAUZE/BANDAGES/DRESSINGS) ×1 IMPLANT
DRSG OPSITE POSTOP 3X4 (GAUZE/BANDAGES/DRESSINGS) IMPLANT
DURAPREP 26ML APPLICATOR (WOUND CARE) ×2 IMPLANT
ELECT BLADE 4.0 EZ CLEAN MEGAD (MISCELLANEOUS)
ELECT CAUTERY BLADE 6.4 (BLADE) ×2 IMPLANT
ELECT PENCIL ROCKER SW 15FT (MISCELLANEOUS) ×2 IMPLANT
ELECT REM PT RETURN 9FT ADLT (ELECTROSURGICAL) ×2
ELECTRODE BLDE 4.0 EZ CLN MEGD (MISCELLANEOUS) IMPLANT
ELECTRODE REM PT RTRN 9FT ADLT (ELECTROSURGICAL) ×1 IMPLANT
EVACUATOR 1/8 PVC DRAIN (DRAIN) IMPLANT
GAUZE SPONGE 4X4 12PLY STRL (GAUZE/BANDAGES/DRESSINGS) ×1 IMPLANT
GLOVE BIO SURGEON STRL SZ 6.5 (GLOVE) ×2 IMPLANT
GLOVE BIOGEL PI IND STRL 6.5 (GLOVE) ×1 IMPLANT
GLOVE BIOGEL PI IND STRL 8.5 (GLOVE) ×1 IMPLANT
GLOVE BIOGEL PI INDICATOR 6.5 (GLOVE) ×1
GLOVE BIOGEL PI INDICATOR 8.5 (GLOVE) ×1
GLOVE SS BIOGEL STRL SZ 8.5 (GLOVE) ×1 IMPLANT
GLOVE SUPERSENSE BIOGEL SZ 8.5 (GLOVE) ×1
GOWN STRL REUS W/ TWL LRG LVL3 (GOWN DISPOSABLE) ×1 IMPLANT
GOWN STRL REUS W/TWL 2XL LVL3 (GOWN DISPOSABLE) ×4 IMPLANT
GOWN STRL REUS W/TWL LRG LVL3 (GOWN DISPOSABLE) ×2
HANDPIECE INTERPULSE COAX TIP (DISPOSABLE) ×2
KIT BASIN OR (CUSTOM PROCEDURE TRAY) ×2 IMPLANT
KIT TURNOVER KIT B (KITS) ×2 IMPLANT
NDL SPNL 18GX3.5 QUINCKE PK (NEEDLE) ×2 IMPLANT
NEEDLE 22X1 1/2 (OR ONLY) (NEEDLE) ×2 IMPLANT
NEEDLE SPNL 18GX3.5 QUINCKE PK (NEEDLE) ×4 IMPLANT
NS IRRIG 1000ML POUR BTL (IV SOLUTION) ×2 IMPLANT
PACK LAMINECTOMY ORTHO (CUSTOM PROCEDURE TRAY) ×2 IMPLANT
PACK UNIVERSAL I (CUSTOM PROCEDURE TRAY) ×2 IMPLANT
PAD ARMBOARD 7.5X6 YLW CONV (MISCELLANEOUS) ×4 IMPLANT
PATTIES SURGICAL .5 X.5 (GAUZE/BANDAGES/DRESSINGS) IMPLANT
PATTIES SURGICAL .5 X1 (DISPOSABLE) IMPLANT
SET HNDPC FAN SPRY TIP SCT (DISPOSABLE) IMPLANT
SPONGE SURGIFOAM ABS GEL 100 (HEMOSTASIS) IMPLANT
STRIP CLOSURE SKIN 1/2X4 (GAUZE/BANDAGES/DRESSINGS) ×1 IMPLANT
SURGIFLO W/THROMBIN 8M KIT (HEMOSTASIS) IMPLANT
SUT BONE WAX W31G (SUTURE) ×1 IMPLANT
SUT MNCRL AB 3-0 PS2 27 (SUTURE) ×2 IMPLANT
SUT PROLENE 0 CT 2 (SUTURE) ×1 IMPLANT
SUT PROLENE 1 MO 6 (SUTURE) ×1 IMPLANT
SUT VIC AB 0 CT1 27 (SUTURE) ×2
SUT VIC AB 0 CT1 27XBRD ANBCTR (SUTURE) ×1 IMPLANT
SUT VIC AB 1 CTX 36 (SUTURE) ×4
SUT VIC AB 1 CTX36XBRD ANBCTR (SUTURE) ×2 IMPLANT
SUT VIC AB 2-0 CT1 18 (SUTURE) ×2 IMPLANT
SYR BULB IRRIG 60ML STRL (SYRINGE) ×2 IMPLANT
SYR CONTROL 10ML LL (SYRINGE) IMPLANT
TOWEL GREEN STERILE (TOWEL DISPOSABLE) ×2 IMPLANT
TOWEL GREEN STERILE FF (TOWEL DISPOSABLE) ×2 IMPLANT
WATER STERILE IRR 1000ML POUR (IV SOLUTION) ×2 IMPLANT
YANKAUER SUCT BULB TIP NO VENT (SUCTIONS) ×2 IMPLANT

## 2020-05-09 NOTE — Anesthesia Procedure Notes (Signed)
Procedure Name: LMA Insertion Date/Time: 05/09/2020 2:26 PM Performed by: Jenne Campus, CRNA Pre-anesthesia Checklist: Patient identified, Emergency Drugs available, Suction available and Patient being monitored Patient Re-evaluated:Patient Re-evaluated prior to induction Oxygen Delivery Method: Circle System Utilized Preoxygenation: Pre-oxygenation with 100% oxygen Induction Type: IV induction Ventilation: Mask ventilation without difficulty LMA: LMA inserted LMA Size: 4.0 Number of attempts: 1 Airway Equipment and Method: Bite block Placement Confirmation: positive ETCO2 and breath sounds checked- equal and bilateral Tube secured with: Tape Dental Injury: Teeth and Oropharynx as per pre-operative assessment

## 2020-05-09 NOTE — Brief Op Note (Signed)
05/09/2020  3:16 PM  PATIENT:  Hector [name]  59 y.o. male  PRE-OPERATIVE DIAGNOSIS:  Superficial wound dehiscence and infection  POST-OPERATIVE DIAGNOSIS:  Superficial wound dehiscence and infection  PROCEDURE:  Procedure(s) with comments: Irrigation and debridement of superficial wound closure (N/A) - 1 hr  SURGEON:  Surgeon(s) and Role:    Melina Schools, MD - Primary  PHYSICIAN ASSISTANT:   ASSISTANTS: none   ANESTHESIA:   general  EBL:  minimal   BLOOD ADMINISTERED:none  DRAINS: none   LOCAL MEDICATIONS USED:  NONE  SPECIMEN:  No Specimen  DISPOSITION OF SPECIMEN:  N/A  COUNTS:  YES  TOURNIQUET:  * No tourniquets in log *  DICTATION: .Dragon Dictation  PLAN OF CARE: Discharge to home after PACU  PATIENT DISPOSITION:  PACU - hemodynamically stable.

## 2020-05-09 NOTE — Anesthesia Postprocedure Evaluation (Signed)
Anesthesia Post Note  Patient: Hector [name]  Procedure(s) Performed: Irrigation and debridement of superficial wound closure (N/A Abdomen)     Patient location during evaluation: PACU Anesthesia Type: General Level of consciousness: awake and alert Pain management: pain level controlled Vital Signs Assessment: post-procedure vital signs reviewed and stable Respiratory status: spontaneous breathing, nonlabored ventilation, respiratory function stable and patient connected to nasal cannula oxygen Cardiovascular status: blood pressure returned to baseline and stable Postop Assessment: no apparent nausea or vomiting Anesthetic complications: no   No complications documented.  Last Vitals:  Vitals:   05/09/20 1615 05/09/20 1620  BP: (!) 159/98 (!) 172/102  Pulse: 79 92  Resp:  14  Temp:  36.4 C  SpO2: 94% 100%    Last Pain:  Vitals:   05/09/20 1620  TempSrc:   PainSc: Clayton Endya Austin

## 2020-05-09 NOTE — Transfer of Care (Signed)
Immediate Anesthesia Transfer of Care Note  Patient: Hector [name]  Procedure(s) Performed: Irrigation and debridement of superficial wound closure (N/A Abdomen)  Patient Location: PACU  Anesthesia Type:General  Level of Consciousness: awake, oriented and patient cooperative  Airway & Oxygen Therapy: Patient Spontanous Breathing and Patient connected to nasal cannula oxygen  Post-op Assessment: Report given to RN and Post -op Vital signs reviewed and stable  Post vital signs: Reviewed  Last Vitals:  Vitals Value Taken Time  BP 154/102 05/09/20 1518  Temp    Pulse 93 05/09/20 1518  Resp 20 05/09/20 1518  SpO2 96 % 05/09/20 1518  Vitals shown include unvalidated device data.  Last Pain:  Vitals:   05/09/20 1245  TempSrc:   PainSc: 4          Complications: No complications documented.

## 2020-05-09 NOTE — Op Note (Signed)
Operative report  Preoperative diagnosis: Superficial wound dehiscence  Postoperative diagnosis: Same  Operative procedure: I&D revision of superficial iliac crest bone graft site  Complications: None  Indications: Hector [name] is a very pleasant 59 year old gentleman who had a previous anterior posterior lumbar fusion necessitating a separate iliac crest bone graft site on the left side.  Patient patient has made an excellent recovery as a relates to his chronic back pain and all of his incisions except for the small iliac crest bone graft site have healed uneventfully.  Despite attempts at secondary closure he continued to have wound healing complications and a dehiscence.  There is no signs or symptoms of infection.  As result of the failure to heal we elected to take him back for revision of the wound.  All appropriate risks benefits and alternatives were discussed with the patient and consent was obtained  Operative report: Patient was brought the operating room placed upon the operating room table.  After successful induction of laryngeal mask anesthesia the left iliac crest bone graft site was prepped and draped in standard fashion.  Timeout was taken confirming patient procedure and all other important data.  Using a 15 blade scalpel I freshened the wound edges and extended them.  I then dissected down to the iliac crest.  There was no purulent material or foul odor to suggest infection.  I then irrigated this wound copiously with 2-1/2 L of pulse lavage.  Hemostasis was obtained using electrocautery.  I then closed the deep fascia with interrupted 2-0 Vicryl suture.  Using a 2-0 Prolene running vertical mattress suture I closed the skin.  At this point a medium Aquacel dressing was applied.  This provided a watertight seal and pressure to the wound.  Patient was then extubated transfer the PACU without incident.  The end of the case all needle sponge counts were correct.  There were no adverse  intraoperative events.  Patient will be discharged home with an extra dressing in case it is needed with follow-up with me in 1 week for reevaluation of the wound.

## 2020-05-09 NOTE — H&P (Signed)
Addendum H&P: Hector [name] is a pleasant gentleman who had a anterior posterior spinal fusion with autograft iliac crest bone graft harvest.  Patient is done well from the standpoint of the revision fusion but has a wound dehiscence over his iliac crest bone graft site.  Attempts at secondary closure failed and as result we elected to move forward with a revision of the incision.  Patient will have a formal I&D of the wound and then primary closure.  Patient has had no signs or symptoms of infection.  There is been no change in the patient's clinical exam since his last office visit of 05/07/2020.

## 2020-05-10 ENCOUNTER — Encounter (HOSPITAL_COMMUNITY): Payer: Self-pay | Admitting: Orthopedic Surgery

## 2020-05-11 DIAGNOSIS — Z981 Arthrodesis status: Secondary | ICD-10-CM | POA: Diagnosis not present

## 2020-05-11 DIAGNOSIS — M545 Low back pain: Secondary | ICD-10-CM | POA: Diagnosis not present

## 2020-05-17 DIAGNOSIS — Z125 Encounter for screening for malignant neoplasm of prostate: Secondary | ICD-10-CM | POA: Diagnosis not present

## 2020-05-17 DIAGNOSIS — G609 Hereditary and idiopathic neuropathy, unspecified: Secondary | ICD-10-CM | POA: Diagnosis not present

## 2020-05-17 DIAGNOSIS — E119 Type 2 diabetes mellitus without complications: Secondary | ICD-10-CM | POA: Diagnosis not present

## 2020-05-17 DIAGNOSIS — M109 Gout, unspecified: Secondary | ICD-10-CM | POA: Diagnosis not present

## 2020-05-17 DIAGNOSIS — I10 Essential (primary) hypertension: Secondary | ICD-10-CM | POA: Diagnosis not present

## 2020-05-17 DIAGNOSIS — Z0000 Encounter for general adult medical examination without abnormal findings: Secondary | ICD-10-CM | POA: Diagnosis not present

## 2020-05-17 LAB — LIPID PANEL
Cholesterol: 110 (ref 0–200)
HDL: 62 (ref 35–70)
LDL Cholesterol: 13
Triglycerides: 176 — AB (ref 40–160)

## 2020-05-17 LAB — HEMOGLOBIN A1C: Hemoglobin A1C: 5.2

## 2020-05-17 LAB — PSA: PSA: 0.94

## 2020-05-18 DIAGNOSIS — M545 Low back pain: Secondary | ICD-10-CM | POA: Diagnosis not present

## 2020-05-18 DIAGNOSIS — Z981 Arthrodesis status: Secondary | ICD-10-CM | POA: Diagnosis not present

## 2020-05-23 ENCOUNTER — Other Ambulatory Visit: Payer: Self-pay | Admitting: Podiatry

## 2020-05-24 DIAGNOSIS — Z4889 Encounter for other specified surgical aftercare: Secondary | ICD-10-CM | POA: Diagnosis not present

## 2020-05-28 DIAGNOSIS — F4312 Post-traumatic stress disorder, chronic: Secondary | ICD-10-CM | POA: Diagnosis not present

## 2020-06-02 ENCOUNTER — Other Ambulatory Visit: Payer: Self-pay | Admitting: Podiatry

## 2020-06-03 NOTE — Telephone Encounter (Signed)
Please advise 

## 2020-06-18 ENCOUNTER — Other Ambulatory Visit: Payer: Self-pay | Admitting: Podiatry

## 2020-06-19 DIAGNOSIS — G609 Hereditary and idiopathic neuropathy, unspecified: Secondary | ICD-10-CM | POA: Diagnosis not present

## 2020-06-19 DIAGNOSIS — E119 Type 2 diabetes mellitus without complications: Secondary | ICD-10-CM | POA: Diagnosis not present

## 2020-06-19 DIAGNOSIS — I10 Essential (primary) hypertension: Secondary | ICD-10-CM | POA: Diagnosis not present

## 2020-06-19 DIAGNOSIS — M25471 Effusion, right ankle: Secondary | ICD-10-CM | POA: Diagnosis not present

## 2020-06-27 DIAGNOSIS — E119 Type 2 diabetes mellitus without complications: Secondary | ICD-10-CM | POA: Diagnosis not present

## 2020-06-27 DIAGNOSIS — I10 Essential (primary) hypertension: Secondary | ICD-10-CM | POA: Diagnosis not present

## 2020-06-27 DIAGNOSIS — G609 Hereditary and idiopathic neuropathy, unspecified: Secondary | ICD-10-CM | POA: Diagnosis not present

## 2020-06-27 DIAGNOSIS — D649 Anemia, unspecified: Secondary | ICD-10-CM | POA: Diagnosis not present

## 2020-06-27 DIAGNOSIS — R600 Localized edema: Secondary | ICD-10-CM | POA: Diagnosis not present

## 2020-07-02 DIAGNOSIS — Z4889 Encounter for other specified surgical aftercare: Secondary | ICD-10-CM | POA: Diagnosis not present

## 2020-07-04 DIAGNOSIS — M5416 Radiculopathy, lumbar region: Secondary | ICD-10-CM | POA: Diagnosis not present

## 2020-07-04 DIAGNOSIS — G609 Hereditary and idiopathic neuropathy, unspecified: Secondary | ICD-10-CM | POA: Diagnosis not present

## 2020-07-04 DIAGNOSIS — M109 Gout, unspecified: Secondary | ICD-10-CM | POA: Diagnosis not present

## 2020-07-12 DIAGNOSIS — M1712 Unilateral primary osteoarthritis, left knee: Secondary | ICD-10-CM | POA: Diagnosis not present

## 2020-07-24 ENCOUNTER — Other Ambulatory Visit: Payer: Self-pay | Admitting: Podiatry

## 2020-07-25 NOTE — Telephone Encounter (Signed)
Please advise 

## 2020-08-05 ENCOUNTER — Other Ambulatory Visit: Payer: Self-pay | Admitting: Podiatry

## 2020-08-06 NOTE — Telephone Encounter (Signed)
Please advise 

## 2020-08-24 ENCOUNTER — Other Ambulatory Visit: Payer: Self-pay | Admitting: Podiatry

## 2020-08-27 NOTE — Telephone Encounter (Signed)
Please advise 

## 2020-08-28 DIAGNOSIS — F3132 Bipolar disorder, current episode depressed, moderate: Secondary | ICD-10-CM | POA: Diagnosis not present

## 2020-08-28 DIAGNOSIS — F4310 Post-traumatic stress disorder, unspecified: Secondary | ICD-10-CM | POA: Diagnosis not present

## 2020-08-30 ENCOUNTER — Telehealth: Payer: Self-pay | Admitting: Cardiovascular Disease

## 2020-08-30 NOTE — Telephone Encounter (Signed)
3 attempts to schedule fu appt from recall list.   Deleting recall.   

## 2020-09-25 ENCOUNTER — Other Ambulatory Visit: Payer: Self-pay | Admitting: Podiatry

## 2020-09-25 NOTE — Telephone Encounter (Signed)
Please advise 

## 2020-09-26 DIAGNOSIS — F4312 Post-traumatic stress disorder, chronic: Secondary | ICD-10-CM | POA: Diagnosis not present

## 2020-09-27 DIAGNOSIS — R509 Fever, unspecified: Secondary | ICD-10-CM | POA: Diagnosis not present

## 2020-09-27 DIAGNOSIS — Z03818 Encounter for observation for suspected exposure to other biological agents ruled out: Secondary | ICD-10-CM | POA: Diagnosis not present

## 2020-10-02 DIAGNOSIS — Z20822 Contact with and (suspected) exposure to covid-19: Secondary | ICD-10-CM | POA: Diagnosis not present

## 2020-10-03 ENCOUNTER — Other Ambulatory Visit: Payer: Self-pay | Admitting: Podiatry

## 2020-10-05 NOTE — Telephone Encounter (Signed)
Routed to Dr. Patel. 

## 2020-10-17 ENCOUNTER — Other Ambulatory Visit: Payer: Self-pay

## 2020-10-17 ENCOUNTER — Ambulatory Visit (INDEPENDENT_AMBULATORY_CARE_PROVIDER_SITE_OTHER): Payer: Federal, State, Local not specified - PPO

## 2020-10-17 ENCOUNTER — Ambulatory Visit
Admission: RE | Admit: 2020-10-17 | Discharge: 2020-10-17 | Disposition: A | Discharge status: Home / Self Care | Payer: Federal, State, Local not specified - PPO | Source: Ambulatory Visit

## 2020-10-17 VITALS — BP 128/84 | HR 106 | Temp 99.5°F | Resp 18

## 2020-10-17 DIAGNOSIS — R509 Fever, unspecified: Secondary | ICD-10-CM | POA: Diagnosis not present

## 2020-10-17 DIAGNOSIS — J209 Acute bronchitis, unspecified: Secondary | ICD-10-CM | POA: Diagnosis not present

## 2020-10-17 DIAGNOSIS — R059 Cough, unspecified: Secondary | ICD-10-CM | POA: Diagnosis not present

## 2020-10-17 MED ORDER — AMOXICILLIN-POT CLAVULANATE 875-125 MG PO TABS: 1.0000 | ORAL_TABLET | Freq: Two times a day (BID) | ORAL | 0 refills | Status: DC

## 2020-10-17 NOTE — Discharge Instructions (Addendum)
Take the amoxicillin  as prescribed  Recommend mucinex  Drink plenty of water

## 2020-10-17 NOTE — ED Triage Notes (Addendum)
Pt c/o productive cough with yellow/green sputum for approx 2 weeks with SOBOE and discomfort to right upper and mid chest wall with inspiration. Describes chest discomfort as aching in nature. Reports subjective fever for past 3-4 days and increased malaise.  Denies new pain to back, jaw, arm, n/v, diaphoresis, dizziness.  Pt took 650mg  tylenol this morning at 0620.  Upper lobe and mid right lobes with slightly diminished exchange, otherwise CTA. Reports had COVID at the end of December
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
## 2020-10-18 DIAGNOSIS — Z23 Encounter for immunization: Secondary | ICD-10-CM | POA: Diagnosis not present

## 2020-10-18 DIAGNOSIS — Z791 Long term (current) use of non-steroidal anti-inflammatories (NSAID): Secondary | ICD-10-CM | POA: Diagnosis not present

## 2020-10-18 DIAGNOSIS — Z79899 Other long term (current) drug therapy: Secondary | ICD-10-CM | POA: Diagnosis not present

## 2020-10-18 DIAGNOSIS — R1011 Right upper quadrant pain: Secondary | ICD-10-CM | POA: Diagnosis not present

## 2020-10-18 DIAGNOSIS — I712 Thoracic aortic aneurysm, without rupture: Secondary | ICD-10-CM | POA: Diagnosis not present

## 2020-10-24 DIAGNOSIS — Z8719 Personal history of other diseases of the digestive system: Secondary | ICD-10-CM | POA: Diagnosis not present

## 2020-10-24 DIAGNOSIS — I712 Thoracic aortic aneurysm, without rupture: Secondary | ICD-10-CM | POA: Diagnosis not present

## 2020-10-24 DIAGNOSIS — Z86711 Personal history of pulmonary embolism: Secondary | ICD-10-CM | POA: Diagnosis not present

## 2020-10-29 DIAGNOSIS — M542 Cervicalgia: Secondary | ICD-10-CM | POA: Diagnosis not present

## 2020-11-07 ENCOUNTER — Other Ambulatory Visit: Payer: Self-pay | Admitting: Podiatry

## 2020-11-07 DIAGNOSIS — F3132 Bipolar disorder, current episode depressed, moderate: Secondary | ICD-10-CM | POA: Diagnosis not present

## 2020-11-07 DIAGNOSIS — F4310 Post-traumatic stress disorder, unspecified: Secondary | ICD-10-CM | POA: Diagnosis not present

## 2020-11-26 ENCOUNTER — Ambulatory Visit: Payer: Federal, State, Local not specified - PPO | Admitting: Internal Medicine

## 2020-11-26 ENCOUNTER — Other Ambulatory Visit: Payer: Self-pay

## 2020-11-26 ENCOUNTER — Encounter: Payer: Self-pay | Admitting: Internal Medicine

## 2020-11-26 VITALS — BP 128/82 | HR 80 | Temp 98.1°F | Ht 70.0 in | Wt 168.0 lb

## 2020-11-26 DIAGNOSIS — I10 Essential (primary) hypertension: Secondary | ICD-10-CM | POA: Diagnosis not present

## 2020-11-26 DIAGNOSIS — R7989 Other specified abnormal findings of blood chemistry: Secondary | ICD-10-CM | POA: Diagnosis not present

## 2020-11-26 DIAGNOSIS — I7121 Aneurysm of the ascending aorta, without rupture: Secondary | ICD-10-CM

## 2020-11-26 DIAGNOSIS — Z9884 Bariatric surgery status: Secondary | ICD-10-CM

## 2020-11-26 DIAGNOSIS — Z0001 Encounter for general adult medical examination with abnormal findings: Secondary | ICD-10-CM | POA: Insufficient documentation

## 2020-11-26 DIAGNOSIS — D539 Nutritional anemia, unspecified: Secondary | ICD-10-CM | POA: Diagnosis not present

## 2020-11-26 DIAGNOSIS — E538 Deficiency of other specified B group vitamins: Secondary | ICD-10-CM

## 2020-11-26 DIAGNOSIS — D696 Thrombocytopenia, unspecified: Secondary | ICD-10-CM | POA: Diagnosis not present

## 2020-11-26 DIAGNOSIS — E519 Thiamine deficiency, unspecified: Secondary | ICD-10-CM

## 2020-11-26 DIAGNOSIS — I712 Thoracic aortic aneurysm, without rupture: Secondary | ICD-10-CM

## 2020-11-26 DIAGNOSIS — D520 Dietary folate deficiency anemia: Secondary | ICD-10-CM

## 2020-11-26 DIAGNOSIS — E60 Dietary zinc deficiency: Secondary | ICD-10-CM

## 2020-11-26 DIAGNOSIS — D538 Other specified nutritional anemias: Secondary | ICD-10-CM

## 2020-11-26 LAB — CBC WITH DIFFERENTIAL/PLATELET
Basophils Absolute: 0.1 10*3/uL (ref 0.0–0.1)
Basophils Relative: 3.1 % — ABNORMAL HIGH (ref 0.0–3.0)
Eosinophils Absolute: 0.1 10*3/uL (ref 0.0–0.7)
Eosinophils Relative: 1.8 % (ref 0.0–5.0)
HCT: 38.4 % — ABNORMAL LOW (ref 39.0–52.0)
Hemoglobin: 13 g/dL (ref 13.0–17.0)
Lymphocytes Relative: 22.6 % (ref 12.0–46.0)
Lymphs Abs: 0.7 10*3/uL (ref 0.7–4.0)
MCHC: 33.7 g/dL (ref 30.0–36.0)
MCV: 104.1 fl — ABNORMAL HIGH (ref 78.0–100.0)
Monocytes Absolute: 0.3 10*3/uL (ref 0.1–1.0)
Monocytes Relative: 8.1 % (ref 3.0–12.0)
Neutro Abs: 2.1 10*3/uL (ref 1.4–7.7)
Neutrophils Relative %: 64.4 % (ref 43.0–77.0)
Platelets: 108 10*3/uL — ABNORMAL LOW (ref 150.0–400.0)
RBC: 3.69 Mil/uL — ABNORMAL LOW (ref 4.22–5.81)
RDW: 15.3 % (ref 11.5–15.5)
WBC: 3.3 10*3/uL — ABNORMAL LOW (ref 4.0–10.5)

## 2020-11-26 LAB — BASIC METABOLIC PANEL
BUN: 7 mg/dL (ref 6–23)
CO2: 31 mEq/L (ref 19–32)
Calcium: 8.8 mg/dL (ref 8.4–10.5)
Chloride: 102 mEq/L (ref 96–112)
Creatinine, Ser: 1.14 mg/dL (ref 0.40–1.50)
GFR: 70.55 mL/min (ref >60.00)
Glucose, Bld: 131 mg/dL — ABNORMAL HIGH (ref 70–99)
Potassium: 3.9 mEq/L (ref 3.5–5.1)
Sodium: 142 mEq/L (ref 135–145)

## 2020-11-26 LAB — PROTIME-INR
INR: 1 ratio (ref 0.8–1.0)
Prothrombin Time: 11.2 s (ref 9.6–13.1)

## 2020-11-26 LAB — RETICULOCYTES
ABS Retic: 62220 cells/uL (ref 25000–9000)
Retic Ct Pct: 1.7 %

## 2020-11-26 LAB — HEPATIC FUNCTION PANEL
ALT: 27 U/L (ref 0–53)
AST: 68 U/L — ABNORMAL HIGH (ref 0–37)
Albumin: 3.5 g/dL (ref 3.5–5.2)
Alkaline Phosphatase: 104 U/L (ref 39–117)
Bilirubin, Direct: 0.3 mg/dL (ref 0.0–0.3)
Total Bilirubin: 1 mg/dL (ref 0.2–1.2)
Total Protein: 6.4 g/dL (ref 6.0–8.3)

## 2020-11-26 LAB — FERRITIN: Ferritin: 81.7 ng/mL (ref 22.0–322.0)

## 2020-11-26 LAB — IRON: Iron: 125 ug/dL (ref 42–165)

## 2020-11-26 LAB — FOLATE: Folate: 4.3 ng/mL — ABNORMAL LOW (ref >5.9)

## 2020-11-26 LAB — VITAMIN B12: Vitamin B-12: >1506 pg/mL — ABNORMAL HIGH (ref 211–911)

## 2020-11-26 MED ORDER — FOLIC ACID 1 MG PO TABS
1.0000 mg | ORAL_TABLET | Freq: Every day | ORAL | 1 refills | Status: DC
Start: 2020-11-26 — End: 2024-01-27

## 2020-11-26 NOTE — Patient Instructions (Signed)
[employer] medicine (25th ed., pp. [phone]). [place], [place]: Elsevier.">  Anemia  Anemia is a condition in which there is not enough red blood cells or hemoglobin in the blood. Hemoglobin is a substance in red blood cells that carries oxygen. When you do not have enough red blood cells or hemoglobin (are anemic), your body cannot get enough oxygen and your organs may not work properly. As a result, you may feel very tired or have other problems. What are the causes? Common causes of anemia include:  Excessive bleeding. Anemia can be caused by excessive bleeding inside or outside the body, including bleeding from the intestines or from heavy menstrual periods in females.  Poor nutrition.  Long-lasting (chronic) kidney, thyroid, and liver disease.  Bone marrow disorders, spleen problems, and blood disorders.  Cancer and treatments for cancer.  HIV (human immunodeficiency virus) and AIDS (acquired immunodeficiency syndrome).  Infections, medicines, and autoimmune disorders that destroy red blood cells. What are the signs or symptoms? Symptoms of this condition include:  Minor weakness.  Dizziness.  Headache, or difficulties concentrating and sleeping.  Heartbeats that feel irregular or faster than normal (palpitations).  Shortness of breath, especially with exercise.  Pale skin, lips, and nails, or cold hands and feet.  Indigestion and nausea. Symptoms may occur suddenly or develop slowly. If your anemia is mild, you may not have symptoms. How is this diagnosed? This condition is diagnosed based on blood tests, your medical history, and a physical exam. In some cases, a test may be needed in which cells are removed from the soft tissue inside of a bone and looked at under a microscope (bone marrow biopsy). Your health care provider may also check your stool (feces) for blood and may do additional testing to look for the cause of your bleeding. Other tests may  include:  Imaging tests, such as a CT scan or MRI.  A procedure to see inside your esophagus and stomach (endoscopy).  A procedure to see inside your colon and rectum (colonoscopy). How is this treated? Treatment for this condition depends on the cause. If you continue to lose a lot of blood, you may need to be treated at a hospital. Treatment may include:  Taking supplements of iron, vitamin Q68, or folic acid.  Taking a hormone medicine (erythropoietin) that can help to stimulate red blood cell growth.  Having a blood transfusion. This may be needed if you lose a lot of blood.  Making changes to your diet.  Having surgery to remove your spleen. Follow these instructions at home:  Take over-the-counter and prescription medicines only as told by your health care provider.  Take supplements only as told by your health care provider.  Follow any diet instructions that you were given by your health care provider.  Keep all follow-up visits as told by your health care provider. This is important. Contact a health care provider if:  You develop new bleeding anywhere in the body. Get help right away if:  You are very weak.  You are short of breath.  You have pain in your abdomen or chest.  You are dizzy or feel faint.  You have trouble concentrating.  You have bloody stools, black stools, or tarry stools.  You vomit repeatedly or you vomit up blood. These symptoms may represent a serious problem that is an emergency. Do not wait to see if the symptoms will go away. Get medical help right away. Call your local emergency services (911 in the [place] Do not  drive yourself to the hospital. Summary  Anemia is a condition in which you do not have enough red blood cells or enough of a substance in your red blood cells that carries oxygen (hemoglobin).  Symptoms may occur suddenly or develop slowly.  If your anemia is mild, you may not have symptoms.  This condition is  diagnosed with blood tests, a medical history, and a physical exam. Other tests may be needed.  Treatment for this condition depends on the cause of the anemia. This information is not intended to replace advice given to you by your health care provider. Make sure you discuss any questions you have with your health care provider. Document Revised: 08/09/2019 Document Reviewed: 08/09/2019 Elsevier Patient Education  2021 [employer].  

## 2020-11-26 NOTE — Progress Notes (Unsigned)
Subjective:  Patient ID: Hector [name], male    DOB: 08-16-1961  Age: 60 y.o. MRN: [id number]  CC: Hypertension and Anemia  This visit occurred during the SARS-CoV-2 public health emergency.  Safety protocols were in place, including screening questions prior to the visit, additional usage of staff PPE, and extensive cleaning of exam room while observing appropriate contact time as indicated for disinfecting solutions.    HPI Hector [name] presents for f/up and to establish.  New to me.  He has a complex medical history.  He has a TAA with a recent measurement of 4.3 cm.  He denies chest pain or shortness of breath.  He complains of chronic low back pain and has a history of peptic ulcer disease with perforation as well as pulmonary embolus.  He complains of a several month history of bilateral lower extremity burning, numbness, and tingling.  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), Anxiety, Ascending aortic aneurysm (HCC), B12 nutritional deficiency, Bipolar disorder ([place]), Chest pain, Closed head injury, DDD (degenerative disc disease), Depression, Diabetes mellitus, GERD (gastroesophageal reflux disease), Gout, Headache, History of bunionectomy of left great toe, History of concussion, History of echocardiogram, History of kidney stones, History of renal stone, Hyperlipidemia, Hypertension (02/27/2017), Neuromuscular disorder ([place]), Nonspecific pain in the lumbar region, OBESITY (12/10/2007), OSA (obstructive sleep apnea) (12/12/2013), Osteoarthritis of knee, Pain in left knee, Pancreatitis (03/21/2016), Perforated ulcer ([place]), Peripheral neuropathy, PONV (postoperative nausea and vomiting), and Pulmonary embolism ([place]).   He has a past surgical history that includes Vasectomy; Nasal septoplasty w/ turbinoplasty; Gastric bypass (11/06/09); Epidural block injection; surgerical repair of stomach ulcer (06/08/10); Hiatal  hernia repair (N/A, 01/20/2013); Back surgery (2012); Tonsillectomy; Uvulopalatopharyngoplasty (2007); Shoulder arthroscopy (2014); Carpal tunnel release (2008); Mass excision (Right, 04/18/2013); carpel tunnel (Left); Lumbar laminectomy/decompression microdiscectomy (Left, 06/07/2014); colonscopy ; Hernia repair (1998); Foot surgery; Nephrolithotomy (Right, 11/03/2014); Cholecystectomy (N/A, 03/21/2016); Hardware Removal (N/A, 05/02/2016); Diagnostic laparoscopy; Anterior lumbar fusion (N/A, 03/29/2020); Abdominal exposure (N/A, 03/29/2020); and Debridement and closure wound (N/A, 05/09/2020).   His family history includes Arthritis in his father and sister; COPD in his father; Cervical cancer in his mother; Colon cancer in his maternal uncle and paternal aunt; Diabetes in his paternal aunt, paternal uncle, and sister; Heart disease in his father, maternal grandfather, maternal grandmother, paternal grandfather, and paternal grandmother; Hypertension in his mother; Seizures in his father; Uterine cancer in his mother.He reports that he has never smoked. He has never used smokeless tobacco. He reports current alcohol use. He reports that he does not use drugs.  Outpatient Medications Prior to Visit  Medication Sig Dispense Refill  . albuterol (PROVENTIL HFA;VENTOLIN HFA) 108 (90 Base) MCG/ACT inhaler Inhale 2 puffs into the lungs every 6 (six) hours as needed for wheezing or shortness of breath. 1 Inhaler 0  . allopurinol (ZYLOPRIM) 100 MG tablet TAKE ONE TABLET BY MOUTH DAILY (Patient taking differently: Take 100 mg by mouth daily.) 30 tablet 5  . celecoxib (CELEBREX) 200 MG capsule Take 200 mg by mouth daily.    . colchicine 0.6 MG tablet TAKE ONE TABLET BY MOUTH DAILY AS NEEDED FOR GOUT 30 tablet 1  . diazepam (VALIUM) 5 MG tablet Take 5 mg by mouth every 6 (six) hours as needed for anxiety.    [name] escitalopram (LEXAPRO) 20 MG tablet Take 20 mg by mouth every morning.     [name] HYDROmorphone (DILAUDID) 8 MG tablet Take  8  mg by mouth 4 (four) times daily as needed for severe pain.    [name] lamoTRIgine (LAMICTAL) 100 MG tablet Take 150 mg by mouth 2 (two) times daily.     [name] losartan-hydrochlorothiazide (HYZAAR) 50-12.5 MG tablet Take 1 tablet by mouth daily.    . meloxicam (MOBIC) 15 MG tablet TAKE ONE TABLET BY MOUTH DAILY 30 tablet 0  . omeprazole (PRILOSEC) 40 MG capsule Take 40 mg by mouth daily.    [name] oxyCODONE-acetaminophen (PERCOCET) 10-325 MG tablet Take 1 tablet by mouth 2 (two) times daily as needed for pain. Takes 1 tablet in the morning and 1 tablet at bedtime    . pregabalin (LYRICA) 75 MG capsule Take 75 mg by mouth daily.     [name] Spacer/Aero-Holding Chambers (E-Z SPACER) inhaler Use as instructed 1 each 2  . tamsulosin (FLOMAX) 0.4 MG CAPS capsule Take 0.4 mg by mouth daily as needed (kidney stones).     . traZODone (DESYREL) 100 MG tablet Take 300 mg by mouth at bedtime.    [name] amoxicillin-clavulanate (AUGMENTIN) 875-125 MG tablet Take 1 tablet by mouth every 12 (twelve) hours. 14 tablet 0  . nitroGLYCERIN (NITROSTAT) 0.3 MG SL tablet Place 1 tablet (0.3 mg total) under the tongue every 5 (five) minutes as needed for chest pain. 100 tablet 3   No facility-administered medications prior to visit.    ROS Review of Systems  Constitutional: Negative for appetite change, chills, diaphoresis, fatigue and fever.  HENT: Negative.   Eyes: Negative.   Respiratory: Negative for cough, chest tightness, shortness of breath and wheezing.   Cardiovascular: Negative for chest pain, palpitations and leg swelling.  Gastrointestinal: Negative for abdominal pain, constipation, diarrhea, nausea and vomiting.  Endocrine: Negative.  Negative for polydipsia, polyphagia and polyuria.  Genitourinary: Negative.  Negative for difficulty urinating.  Musculoskeletal: Positive for back pain.  Skin: Positive for pallor. Negative for color change.  Allergic/Immunologic: Negative.   Neurological: Positive for numbness. Negative  for dizziness and weakness.  Hematological: Negative for adenopathy. Does not bruise/bleed easily.  Psychiatric/Behavioral: Negative.     Objective:  BP 128/82   Pulse 80   Temp 98.1 F (36.7 C) (Oral)   Ht 5\' 10"  (1.778 m)   Wt 168 lb (76.2 kg)   SpO2 96%   BMI 24.11 kg/m   Physical Exam Vitals reviewed.  Constitutional:      Appearance: Normal appearance.  HENT:     Nose: Nose normal.     Mouth/Throat:     Mouth: Mucous membranes are moist.  Eyes:     General: No scleral icterus.    Conjunctiva/sclera: Conjunctivae normal.  Cardiovascular:     Rate and Rhythm: Normal rate and regular rhythm.     Heart sounds: No murmur heard.   Pulmonary:     Effort: Pulmonary effort is normal.     Breath sounds: No stridor. No wheezing, rhonchi or rales.  Abdominal:     General: Abdomen is flat. Bowel sounds are normal. There is no distension.     Palpations: There is no hepatomegaly, splenomegaly or mass.     Tenderness: There is no abdominal tenderness.  Musculoskeletal:        General: Normal range of motion.     Cervical back: Neck supple.     Right lower leg: No edema.     Left lower leg: No edema.  Lymphadenopathy:     Cervical: No cervical adenopathy.  Skin:    General: Skin is warm.  Coloration: Skin is pale. Skin is not jaundiced.     Findings: No bruising or rash.  Neurological:     General: No focal deficit present.     Mental Status: He is alert and oriented to person, place, and time.  Psychiatric:        Mood and Affect: Mood normal.     Lab Results  Component Value Date   WBC 3.3 (L) 11/26/2020   HGB 13.0 11/26/2020   HCT 38.4 (L) 11/26/2020   PLT 108.0 (L) 11/26/2020   GLUCOSE 131 (H) 11/26/2020   CHOL 110 05/17/2020   TRIG 176 (A) 05/17/2020   HDL 62 05/17/2020   LDLDIRECT 113.2 04/01/2007   LDLCALC 13 05/17/2020   ALT 27 11/26/2020   AST 68 (H) 11/26/2020   NA 142 11/26/2020   K 3.9 11/26/2020   CL 102 11/26/2020   CREATININE 1.14  11/26/2020   BUN 7 11/26/2020   CO2 31 11/26/2020   TSH 0.93 01/07/2016   PSA 0.94 05/17/2020   INR 1.0 11/26/2020   HGBA1C 5.2 05/17/2020   MICROALBUR 1.1 02/23/2018    Assessment & Plan:   Dequandre was seen today for hypertension and anemia.  Diagnoses and all orders for this visit:  Deficiency anemia- I will screen him for vitamin deficiencies. -     CBC with Differential/Platelet; Future -     Vitamin B12; Future -     Iron; Future -     Reticulocytes; Future -     Folate; Future -     Ferritin; Future -     Vitamin B1; Future -     Zinc; Future -     Zinc -     Vitamin B1 -     Ferritin -     Folate -     Reticulocytes -     Iron -     Vitamin B12 -     CBC with Differential/Platelet  Elevated LFTs- His AST remains elevated.  I will screen him for viral hepatitis. -     Hepatic function panel; Future -     Hepatitis A antibody, total; Future -     Hepatitis B core antibody, total; Future -     Hepatitis B surface antibody,qualitative; Future -     Hepatitis B surface antigen; Future -     Hepatitis C antibody; Future -     Protime-INR; Future -     Protime-INR -     Hepatitis C antibody -     Hepatitis B surface antigen -     Hepatitis B surface antibody,qualitative -     Hepatitis B core antibody, total -     Hepatitis A antibody, total -     Hepatic function panel  Encounter for general adult medical examination with abnormal findings  Primary hypertension- His blood pressure is adequately well controlled. -     Basic metabolic panel; Future -     Basic metabolic panel  Thrombocytopenia ([place])- I will screen for secondary causes. 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  Dietary folate deficiency anemia -     folic acid (FOLVITE) 1 MG tablet; Take 1 tablet (1 mg total) by mouth daily.  Anemia due to zinc deficiency -     zinc gluconate 50 MG tablet; Take 1 tablet (50 mg total) by mouth daily.  Anemia due to acquired thiamine deficiency -     thiamine (VITAMIN B-1) 50 MG tablet; Take 1 tablet (50 mg total) by mouth daily.   I have discontinued [name] "Jim"'s amoxicillin-clavulanate. I am also having him start on folic acid, zinc gluconate, and thiamine. Additionally, I am having him maintain his escitalopram, lamoTRIgine, albuterol, E-Z Spacer, nitroGLYCERIN, losartan-hydrochlorothiazide, traZODone, allopurinol, pregabalin, diazepam, tamsulosin, HYDROmorphone, oxyCODONE-acetaminophen, colchicine, celecoxib, omeprazole, and meloxicam.  Meds ordered this encounter  Medications  . folic acid (FOLVITE) 1 MG tablet    Sig: Take 1 tablet (1 mg total) by mouth daily.    Dispense:  90 tablet    Refill:  1  . zinc gluconate 50 MG tablet    Sig: Take 1 tablet (50 mg total) by mouth daily.    Dispense:  90 tablet    Refill:  1  . thiamine (VITAMIN B-1) 50 MG tablet    Sig: Take 1 tablet (50 mg total) by mouth daily.    Dispense:  90 tablet    Refill:  1     Follow-up: Return in about 3 months (around 02/26/2021).  Scarlette Calico, MD

## 2020-11-30 LAB — HEPATITIS C ANTIBODY
Hepatitis C Ab: NONREACTIVE
SIGNAL TO CUT-OFF: 0.02 (ref <1.00)

## 2020-11-30 LAB — VITAMIN B1: Vitamin B1 (Thiamine): 8 nmol/L (ref 8–30)

## 2020-11-30 LAB — HEPATITIS A ANTIBODY, TOTAL: Hepatitis A AB,Total: NONREACTIVE

## 2020-11-30 LAB — HEPATITIS B SURFACE ANTIGEN: Hepatitis B Surface Ag: NONREACTIVE

## 2020-11-30 LAB — HEPATITIS B SURFACE ANTIBODY,QUALITATIVE: Hep B S Ab: NONREACTIVE

## 2020-11-30 LAB — HEPATITIS B CORE ANTIBODY, TOTAL: Hep B Core Total Ab: NONREACTIVE

## 2020-11-30 LAB — ZINC: Zinc: 56 ug/dL — ABNORMAL LOW (ref 60–130)

## 2020-12-02 DIAGNOSIS — D538 Other specified nutritional anemias: Secondary | ICD-10-CM | POA: Insufficient documentation

## 2020-12-02 DIAGNOSIS — E519 Thiamine deficiency, unspecified: Secondary | ICD-10-CM | POA: Insufficient documentation

## 2020-12-02 MED ORDER — VITAMIN B-1 50 MG PO TABS: 50.0000 mg | ORAL_TABLET | Freq: Every day | ORAL | 1 refills | Status: DC

## 2020-12-02 MED ORDER — ZINC GLUCONATE 50 MG PO TABS: 50.0000 mg | ORAL_TABLET | Freq: Every day | ORAL | 1 refills | Status: DC

## 2020-12-03 ENCOUNTER — Other Ambulatory Visit: Payer: Self-pay | Admitting: Podiatry

## 2020-12-03 NOTE — Telephone Encounter (Signed)
Please advise 

## 2021-01-08 DIAGNOSIS — F3132 Bipolar disorder, current episode depressed, moderate: Secondary | ICD-10-CM | POA: Diagnosis not present

## 2021-01-08 DIAGNOSIS — F4310 Post-traumatic stress disorder, unspecified: Secondary | ICD-10-CM | POA: Diagnosis not present

## 2021-02-14 DIAGNOSIS — Z79899 Other long term (current) drug therapy: Secondary | ICD-10-CM | POA: Diagnosis not present

## 2021-02-14 DIAGNOSIS — Z5181 Encounter for therapeutic drug level monitoring: Secondary | ICD-10-CM | POA: Diagnosis not present

## 2021-02-14 DIAGNOSIS — M545 Low back pain, unspecified: Secondary | ICD-10-CM | POA: Diagnosis not present

## 2021-02-19 ENCOUNTER — Telehealth: Payer: Self-pay

## 2021-02-19 NOTE — Telephone Encounter (Signed)
Pt would like a refill for allopurinol sent to [facility], he stated that he only has 4 pills left.

## 2021-02-19 NOTE — Telephone Encounter (Signed)
He would need t to get a further refill of allopurinol from his primary care physician.  I also have not seen him in over 6 months.

## 2021-02-20 ENCOUNTER — Emergency Department: Payer: Federal, State, Local not specified - PPO

## 2021-02-20 ENCOUNTER — Other Ambulatory Visit: Payer: Self-pay

## 2021-02-20 ENCOUNTER — Emergency Department
Admission: EM | Admit: 2021-02-20 | Discharge: 2021-02-20 | Disposition: A | Discharge status: Home / Self Care | Payer: Federal, State, Local not specified - PPO | Attending: Emergency Medicine | Admitting: Emergency Medicine

## 2021-02-20 ENCOUNTER — Encounter: Payer: Self-pay | Admitting: Emergency Medicine

## 2021-02-20 DIAGNOSIS — S0993XA Unspecified injury of face, initial encounter: Secondary | ICD-10-CM | POA: Diagnosis not present

## 2021-02-20 DIAGNOSIS — W19XXXA Unspecified fall, initial encounter: Secondary | ICD-10-CM

## 2021-02-20 DIAGNOSIS — S0182XA Laceration with foreign body of other part of head, initial encounter: Secondary | ICD-10-CM | POA: Diagnosis not present

## 2021-02-20 DIAGNOSIS — S01412A Laceration without foreign body of left cheek and temporomandibular area, initial encounter: Secondary | ICD-10-CM | POA: Insufficient documentation

## 2021-02-20 DIAGNOSIS — Z79899 Other long term (current) drug therapy: Secondary | ICD-10-CM | POA: Diagnosis not present

## 2021-02-20 DIAGNOSIS — S0181XA Laceration without foreign body of other part of head, initial encounter: Secondary | ICD-10-CM

## 2021-02-20 DIAGNOSIS — M6281 Muscle weakness (generalized): Secondary | ICD-10-CM | POA: Insufficient documentation

## 2021-02-20 DIAGNOSIS — I10 Essential (primary) hypertension: Secondary | ICD-10-CM | POA: Diagnosis not present

## 2021-02-20 DIAGNOSIS — R55 Syncope and collapse: Secondary | ICD-10-CM | POA: Insufficient documentation

## 2021-02-20 DIAGNOSIS — W01198A Fall on same level from slipping, tripping and stumbling with subsequent striking against other object, initial encounter: Secondary | ICD-10-CM | POA: Insufficient documentation

## 2021-02-20 DIAGNOSIS — Z043 Encounter for examination and observation following other accident: Secondary | ICD-10-CM | POA: Diagnosis not present

## 2021-02-20 DIAGNOSIS — M545 Low back pain, unspecified: Secondary | ICD-10-CM | POA: Diagnosis not present

## 2021-02-20 DIAGNOSIS — E119 Type 2 diabetes mellitus without complications: Secondary | ICD-10-CM | POA: Insufficient documentation

## 2021-02-20 LAB — COMPREHENSIVE METABOLIC PANEL
ALT: 24 U/L (ref 0–44)
AST: 65 U/L — ABNORMAL HIGH (ref 15–41)
Albumin: 3.4 g/dL — ABNORMAL LOW (ref 3.5–5.0)
Alkaline Phosphatase: 86 U/L (ref 38–126)
Anion gap: 9 (ref 5–15)
BUN: 18 mg/dL (ref 6–20)
CO2: 25 mmol/L (ref 22–32)
Calcium: 8.9 mg/dL (ref 8.9–10.3)
Chloride: 106 mmol/L (ref 98–111)
Creatinine, Ser: 1.27 mg/dL — ABNORMAL HIGH (ref 0.61–1.24)
GFR, Estimated: >60 mL/min (ref >60)
Glucose, Bld: 115 mg/dL — ABNORMAL HIGH (ref 70–99)
Potassium: 3.9 mmol/L (ref 3.5–5.1)
Sodium: 140 mmol/L (ref 135–145)
Total Bilirubin: 1 mg/dL (ref 0.3–1.2)
Total Protein: 6.1 g/dL — ABNORMAL LOW (ref 6.5–8.1)

## 2021-02-20 LAB — CBC
HCT: 34.3 % — ABNORMAL LOW (ref 39.0–52.0)
Hemoglobin: 11.5 g/dL — ABNORMAL LOW (ref 13.0–17.0)
MCH: 33.4 pg (ref 26.0–34.0)
MCHC: 33.5 g/dL (ref 30.0–36.0)
MCV: 99.7 fL (ref 80.0–100.0)
Platelets: 85 10*3/uL — ABNORMAL LOW (ref 150–400)
RBC: 3.44 MIL/uL — ABNORMAL LOW (ref 4.22–5.81)
RDW: 13.9 % (ref 11.5–15.5)
WBC: 2.9 10*3/uL — ABNORMAL LOW (ref 4.0–10.5)
nRBC: 0 % (ref 0.0–0.2)

## 2021-02-20 MED ORDER — LIDOCAINE HCL (PF) 1 % IJ SOLN
10.0000 mL | Freq: Once | INTRAMUSCULAR | Status: AC
  Administered 2021-02-20: 10 mL
  Filled 2021-02-20: qty 10

## 2021-02-20 MED ORDER — OXYCODONE HCL 5 MG PO TABS
10.0000 mg | ORAL_TABLET | Freq: Once | ORAL | Status: AC
  Administered 2021-02-20: 10 mg via ORAL
  Filled 2021-02-20: qty 2

## 2021-02-20 MED ORDER — ACETAMINOPHEN 500 MG PO TABS
1000.0000 mg | ORAL_TABLET | Freq: Once | ORAL | Status: AC
  Administered 2021-02-20: 1000 mg via ORAL
  Filled 2021-02-20: qty 2

## 2021-02-20 NOTE — Discharge Instructions (Signed)
Remove the stitches in 7 to 10 days.  Keep the area clean and dry.  Wash gently with soap and water and use antibiotic ointment.  Return to the ED with any worsening symptoms or fevers.

## 2021-02-20 NOTE — ED Notes (Signed)
Resting in bed, headache much better, pt nad, call light in reach.

## 2021-02-20 NOTE — ED Provider Notes (Addendum)
[facility] Emergency Department Provider Note ____________________________________________   Event Date/Time   First MD Initiated Contact with Patient 02/20/21 1111     (approximate)  I have reviewed the triage vital signs and the nursing notes.  HISTORY  Chief Complaint Fall   HPI Hector [name] is a 60 y.o. malewho presents to the ED for evaluation of fall and possible syncope.   Chart review indicates chronic pain syndrome on Percocet 10/325. S/p Roux-en-Y gastric bypass, HTN, stable ascending thoracic aortic aneurysm,  Patient presents to the ED from home for evaluation of a fall and possible syncopal episode.  He reports sitting on his back porch with his dog, getting up and feeling leg weakness bilaterally, causing him to fall forward into a glass sliding door.  After this fall, he reports he possibly syncopized for "just a second or 2."  Reports feeling disoriented and "fuzzy" in a generalized fashion after this fall and reports explicit concern for a concussion.  Denies any additional  Past Medical History:  Diagnosis Date  . Abnormal LFTs    improved after gastic bypass  . ADJ DISORDER WITH MIXED ANXIETY & DEPRESSED MOOD 08/23/2009   Qualifier: Diagnosis of  By: [name] MD, Standley Brooking Now on lamictal and lexapro and elevil for sleep.   [name] Anxiety   . Ascending aortic aneurysm ([place])    a. 06/2018 CT: 4.1cm.  . B12 nutritional deficiency   . Bipolar disorder ([place])   . Chest pain    a. 2007 MV: No ischemia; b. 09/2018 recurrent c/p in setting of PE; c. 09/2018 MV: No ischemia/infarct. EF 55-65%.  . Closed head injury    laceration  9 12  no loc  vision change  . DDD (degenerative disc disease)   . Depression   . Diabetes mellitus    resolved with gastric bypass  . GERD (gastroesophageal reflux disease)    no meds  . Gout   . Headache    once per week uses Indomethicin hx of cluster headaches   . History of bunionectomy of left great toe   .  History of concussion   . History of echocardiogram    a. 09/2018 Echo: Ef 65-70%, no rwma, mild MR. Ao root 66mm (mildly dil).  . History of kidney stones   . History of renal stone    Seen in emergency room not urologist  . Hyperlipidemia   . Hypertension 02/27/2017  . Neuromuscular disorder ([place])   . Nonspecific pain in the lumbar region   . OBESITY 12/10/2007   a. s/p gastric bypass.  . OSA (obstructive sleep apnea) 12/12/2013   resolved by weight loss surgery 10/26/2009  . Osteoarthritis of knee   . Pain in left knee   . Pancreatitis 03/21/2016   went to [place]  . Perforated ulcer ([place])    Surgical repair9/24 2011  . Peripheral neuropathy   . PONV (postoperative nausea and vomiting)   . Pulmonary embolism ([place])    a. 09/18/2018 CTA Chest: RLL segmental arterial branch small volume PE-->Eliquis.    Patient Active Problem List   Diagnosis Date Noted  . Anemia due to zinc deficiency 12/02/2020  . Anemia due to acquired thiamine deficiency 12/02/2020  . Deficiency anemia 11/26/2020  . Elevated LFTs 11/26/2020  . Encounter for general adult medical examination with abnormal findings 11/26/2020  . Dietary folate deficiency anemia 11/26/2020  . Ascending aortic aneurysm ([place])   . Fusion of lumbar spine 03/29/2020  .  Osteoarthritis of knee 11/02/2018  . Chest pain 09/26/2018  . Pulmonary embolism ([place]) 09/24/2018  . Hypertension 02/27/2017  . Chronic low back pain 11/24/2016  . Perennial allergic rhinitis 11/26/2015  . Renal calculi   . 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 Type II or unspecified type diabetes mellitus without mention of complication, not stated as uncontrolled 05/15/2014  . Lumbar back pain with radiculopathy affecting left lower extremity 05/15/2014  . Medication management 05/15/2014  . Insufficient treatment with nasal CPAP 04/24/2014  . 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 OSA (obstructive sleep apnea)  12/12/2013  . 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 Colon cancer screening 12/28/2012  . GERD (gastroesophageal reflux disease) 12/28/2012  . Thrombocytopenia ([place]) 05/24/2010  . GOUT 08/02/2007  . B12 deficiency 04/06/2007  . CARPAL TUNNEL SYNDROME 04/06/2007    Past Surgical History:  Procedure Laterality Date  . ABDOMINAL EXPOSURE N/A 03/29/2020   Procedure: ABDOMINAL EXPOSURE;  Surgeon: Rosetta Posner, MD;  Location: [facility] OR;  Service: Vascular;  Laterality: N/A;  . ANTERIOR LUMBAR FUSION N/A 03/29/2020   Procedure: ANTERIOR LUMBAR FUSION (ALIF) L4-5;  Surgeon: Melina Schools, MD;  Location: [facility];  Service: Orthopedics;  Laterality: N/A;  5.5 hrs Dr. Donnetta Hutching do approach Lt tap block with exparel  . BACK SURGERY  2012   Dr. Dorina Hoyer disk  . CARPAL TUNNEL RELEASE  2008   left-ulnar nerve decomp  . carpel tunnel Left   . CHOLECYSTECTOMY N/A 03/21/2016   Procedure: LAPAROSCOPIC CHOLECYSTECTOMY WITH INTRAOPERATIVE CHOLANGIOGRAM;  Surgeon: Ralene Ok, MD;  Location: [facility];  Service: General;  Laterality: N/A;  . colonscopy     . DEBRIDEMENT AND CLOSURE WOUND N/A 05/09/2020   Procedure: Irrigation and debridement of superficial wound closure;  Surgeon: Melina Schools, MD;  Location: [place];  Service: Orthopedics;  Laterality: N/A;  1 hr  . DIAGNOSTIC LAPAROSCOPY     laparoscopic gastric bypass  . EPIDURAL BLOCK INJECTION     by Dr. Nelva Bush  . FOOT SURGERY     bilat for removal of extra bone   . GASTRIC BYPASS  11/06/09   beginning weight 267  . HARDWARE REMOVAL N/A 05/02/2016   Procedure: Removal of hardware Lumbar five-Sacral one with Metrex;  Surgeon: Kristeen Miss, MD;  Location: [facility];  Service: Neurosurgery;  Laterality: N/A;  Removal of hardware L5-S1 with Metrex  . HERNIA REPAIR  5366   umbilical  . HIATAL HERNIA REPAIR N/A 01/20/2013   Procedure: LAPAROSCOPIC REPAIR OF INTERNAL HERNIA;  Surgeon: Shann Medal, MD;  Location: [facility];  Service:  General;  Laterality: N/A;  . LUMBAR LAMINECTOMY/DECOMPRESSION MICRODISCECTOMY Left 06/07/2014   Procedure: MICRO LUMBAR DECOMPRESSION L5 - S1 ON THE LEFT/L5 FORAMINOTOMY/EXCISION SYNOVIAL CYST  1 LEVEL;  Surgeon: [name], MD;  Location: [facility];  Service: Orthopedics;  Laterality: Left;  [name] MASS EXCISION Right 04/18/2013   Procedure: RIGHT SMALL FINGER SKIN LESION EXCISION;  Surgeon: Jolyn Nap, MD;  Location: [place];  Service: Orthopedics;  Laterality: Right;  . NASAL SEPTOPLASTY W/ TURBINOPLASTY     x 2  . NEPHROLITHOTOMY Right 11/03/2014   Procedure: RIGHT PERCUTANEOUS NEPHROLITHOTOMY;  Surgeon: Claybon Jabs, MD;  Location: [facility];  Service: Urology;  Laterality: Right;  . SHOULDER ARTHROSCOPY  2014   rt  . surgerical repair of stomach ulcer  06/08/10   per  . TONSILLECTOMY    . UVULOPALATOPHARYNGOPLASTY  2007  with tonsils  . VASECTOMY      Prior to Admission medications   Medication Sig Start Date End Date Taking? Authorizing Provider  albuterol (PROVENTIL HFA;VENTOLIN HFA) 108 (90 Base) MCG/ACT inhaler Inhale 2 puffs into the lungs every 6 (six) hours as needed for wheezing or shortness of breath. 09/03/18   Caren Macadam, MD  allopurinol (ZYLOPRIM) 100 MG tablet TAKE ONE TABLET BY MOUTH DAILY Patient taking differently: Take 100 mg by mouth daily. 12/15/19   Wallene Huh, DPM  celecoxib (CELEBREX) 200 MG capsule Take 200 mg by mouth daily. 07/19/20   [provider]  colchicine 0.6 MG tablet TAKE ONE TABLET BY MOUTH DAILY AS NEEDED FOR GOUT 08/07/20   Felipa Furnace, DPM  diazepam (VALIUM) 5 MG tablet Take 5 mg by mouth every 6 (six) hours as needed for anxiety.    [provider]  escitalopram (LEXAPRO) 20 MG tablet Take 20 mg by mouth every morning.     [provider]  folic acid (FOLVITE) 1 MG tablet Take 1 tablet (1 mg total) by mouth daily. 11/26/20   Janith Lima, MD  HYDROmorphone (DILAUDID) 8 MG tablet Take  8 mg by mouth 4 (four) times daily as needed for severe pain.    [provider]  lamoTRIgine (LAMICTAL) 100 MG tablet Take 150 mg by mouth 2 (two) times daily.     [provider]  losartan-hydrochlorothiazide (HYZAAR) 50-12.5 MG tablet Take 1 tablet by mouth daily. 03/17/19   [provider]  meloxicam (MOBIC) 15 MG tablet TAKE ONE TABLET BY MOUTH DAILY 11/07/20   Felipa Furnace, DPM  nitroGLYCERIN (NITROSTAT) 0.3 MG SL tablet Place 1 tablet (0.3 mg total) under the tongue every 5 (five) minutes as needed for chest pain. 09/27/18 03/13/20  Epifanio Lesches, MD  omeprazole (PRILOSEC) 40 MG capsule Take 40 mg by mouth daily. 08/03/20   [provider]  oxyCODONE-acetaminophen (PERCOCET) 10-325 MG tablet Take 1 tablet by mouth 2 (two) times daily as needed for pain. Takes 1 tablet in the morning and 1 tablet at bedtime    [provider]  pregabalin (LYRICA) 75 MG capsule Take 75 mg by mouth daily.     [provider]  Spacer/Aero-Holding Chambers (E-Z SPACER) inhaler Use as instructed 09/03/18   Caren Macadam, MD  tamsulosin (FLOMAX) 0.4 MG CAPS capsule Take 0.4 mg by mouth daily as needed (kidney stones).     [provider]  thiamine (VITAMIN B-1) 50 MG tablet Take 1 tablet (50 mg total) by mouth daily. 12/02/20   Janith Lima, MD  traZODone (DESYREL) 100 MG tablet Take 300 mg by mouth at bedtime. 07/17/19   [provider]  zinc gluconate 50 MG tablet Take 1 tablet (50 mg total) by mouth daily. 12/02/20   Janith Lima, MD  enoxaparin (LOVENOX) 40 MG/0.4ML injection Inject 0.4 mLs (40 mg total) into the skin daily. 10 day supply 1 injection per day Patient not taking: No sig reported 03/29/20 10/17/20  Melina Schools, MD    Allergies Aspirin, Cymbalta [duloxetine hcl], Nsaids, Tape, Hydrocodone, and Vancomycin  Family History  Problem Relation Age of Onset  . Uterine cancer Mother   . Hypertension Mother   .  Cervical cancer Mother   . Heart disease Father   . COPD Father   . Seizures Father   . Arthritis Father   . Arthritis Sister   . Diabetes Sister   . Colon cancer  Maternal Uncle   . Colon cancer Paternal Aunt   . Diabetes Paternal Aunt   . Heart disease Maternal Grandmother   . Heart disease Maternal Grandfather   . Heart disease Paternal Grandmother   . Heart disease Paternal Grandfather   . Diabetes Paternal Uncle     Social History Social History   Tobacco Use  . Smoking status: Never Smoker  . Smokeless tobacco: Never Used  Vaping Use  . Vaping Use: Never used  Substance Use Topics  . Alcohol use: Yes    Comment: occas liquor   . Drug use: No    Review of Systems  Constitutional: No fever/chills.  Positive generalized weakness, syncope and fall Eyes: No visual changes. ENT: No sore throat. Cardiovascular: Denies chest pain. Respiratory: Denies shortness of breath. Gastrointestinal: No abdominal pain.  No nausea, no vomiting.  No diarrhea.  No constipation. Genitourinary: Negative for dysuria. Musculoskeletal: Positive for chronic lumbar pain Skin: Negative for rash. Neurological: Negative for headaches, focal weakness or numbness.  ____________________________________________   PHYSICAL EXAM:  VITAL SIGNS: Vitals:   02/20/21 1451 02/20/21 1500  BP: (!) 144/82 124/86  Pulse:  69  Resp:    Temp:    SpO2:  96%     Constitutional: Alert and oriented. Well appearing and in no acute distress. Eyes: Conjunctivae are normal. PERRL. EOMI. Head: 1.5 cm horizontal laceration to left maxillary cheek, hemostatic with direct pressure.  No associated bony step-offs or signs of EOM entrapment. Nose: No congestion/rhinnorhea. Mouth/Throat: Mucous membranes are moist.  Oropharynx non-erythematous. Neck: No stridor. No cervical spine tenderness to palpation. Cardiovascular: Normal rate, regular rhythm. Grossly normal heart sounds.  Good peripheral  circulation. Respiratory: Normal respiratory effort.  No retractions. Lungs CTAB. Gastrointestinal: Soft , nondistended, nontender to palpation. No CVA tenderness. Musculoskeletal: No lower extremity tenderness nor edema.  No joint effusions. No signs of acute trauma. Lumbar tenderness to palpation diffusely without bony step-offs.  Patient reports he feels about normal. Neurologic:  Normal speech and language. No gross focal neurologic deficits are appreciated.  Cranial nerves II through XII intact 5/5 strength and sensation in all 4 extremities Skin:  Skin is warm, dry and intact. No rash noted. Psychiatric: Mood and affect are normal. Speech and behavior are normal.  ____________________________________________   LABS (all labs ordered are listed, but only abnormal results are displayed)  Labs Reviewed  CBC - Abnormal; Notable for the following components:      Result Value   WBC 2.9 (*)    RBC 3.44 (*)    Hemoglobin 11.5 (*)    HCT 34.3 (*)    Platelets 85 (*)    All other components within normal limits  COMPREHENSIVE METABOLIC PANEL - Abnormal; Notable for the following components:   Glucose, Bld 115 (*)    Creatinine, Ser 1.27 (*)    Total Protein 6.1 (*)    Albumin 3.4 (*)    AST 65 (*)    All other components within normal limits   ____________________________________________  12 Lead EKG  Sinus rhythm, rate of 88, left axis, normal intervals, no evidence of acute ischemia ____________________________________________  RADIOLOGY  ED MD interpretation:    Official radiology report(s): DG Lumbar Spine Complete  Result Date: 02/20/2021 CLINICAL DATA:  Fall, history of chronic pain. Fell onto [employer]. EXAM: LUMBAR SPINE - COMPLETE 4+ VIEW COMPARISON:  March 29, 2020. CT of the lumbar spine from February 2021. FINDINGS: Signs of L4-5 spinal fusion with posterior rod and interbody  cage placement. Five lumbar type vertebral bodies. Mild loss of height approximately  10-20% of L1 since previous CT imaging from 2021 suggestion of this loss of height on the chest x-ray of October 17, 2020. 3 mm retrolisthesis of L1 on L2. 4 mm retrolisthesis of L2 on L3 is new since previous imaging. On oblique views sclerotic lucency through the LEFT L2 pedicle. This is similar to the prior MRI from Jan 18, 2020. Disc space narrowing and degenerative changes noted at T12-L1. IMPRESSION: 1. Potential fracture of LEFT pars interarticularis at L2, best seen on the oblique view associated with new mild retrolisthesis of L2 on L3, not present on CT imaging from February of 2021. CT may be helpful for further assessment. 2. Mild loss of height of L1 since previous CT imaging, potentially present on prior chest x-ray. 3. Mild retrolisthesis of L1 on L2 is unchanged. Electronically Signed   By: Zetta Bills M.D.   On: 02/20/2021 13:37   CT Head Wo Contrast  Result Date: 02/20/2021 CLINICAL DATA:  Fall, facial trauma EXAM: CT HEAD WITHOUT CONTRAST TECHNIQUE: Contiguous axial images were obtained from the base of the skull through the vertex without intravenous contrast. COMPARISON:  05/09/2015 FINDINGS: Brain: Cystic area again seen within the medial left basal ganglia measuring 9 mm compared 8 mm previously, not significantly changed. This was shown on prior MRI to be most compatible with a benign cyst. No acute intracranial abnormality. Specifically, no hemorrhage, hydrocephalus, mass lesion, acute infarction, or significant intracranial injury. Vascular: No hyperdense vessel or unexpected calcification. Skull: No acute calvarial abnormality. Sinuses/Orbits: No acute findings Other: None IMPRESSION: No acute intracranial abnormality. Electronically Signed   By: Rolm Baptise M.D.   On: 02/20/2021 11:36   CT Cervical Spine Wo Contrast  Result Date: 02/20/2021 CLINICAL DATA:  Fall, facial laceration EXAM: CT CERVICAL SPINE WITHOUT CONTRAST TECHNIQUE: Multidetector CT imaging of the cervical spine  was performed without intravenous contrast. Multiplanar CT image reconstructions were also generated. COMPARISON:  None. FINDINGS: Alignment: Normal Skull base and vertebrae: No acute fracture. No primary bone lesion or focal pathologic process. Soft tissues and spinal canal: No prevertebral fluid or swelling. No visible canal hematoma. Disc levels:  Prior discectomy/replacement at C5-6 and C6-7. Upper chest: No acute findings Other: None IMPRESSION: No acute bony abnormality. Electronically Signed   By: Rolm Baptise M.D.   On: 02/20/2021 11:39   CT Lumbar Spine Wo Contrast  Result Date: 02/20/2021 CLINICAL DATA:  [place] today with low back pain EXAM: CT LUMBAR SPINE WITHOUT CONTRAST TECHNIQUE: Multidetector CT imaging of the lumbar spine was performed without intravenous contrast administration. Multiplanar CT image reconstructions were also generated. COMPARISON:  Radiography same day FINDINGS: Segmentation: L5 is a transitional vertebra as numbered previously. Alignment: No malalignment. Vertebrae: Distant discectomy and fusion at L4-5. Fusion is probably solid. No complicating feature. Old appearing superior endplate depression to the right of midline at L1. This was not present in February of 2021 but appears to be healed. Paraspinal and other soft tissues: Negative Disc levels: No significant disc space abnormality from T11-12 through L1-2. L2-3: Minimal disc bulge. Minimal facet and ligamentous hypertrophy. No stenosis. L3-4: Minimal disc bulge. Mild facet degeneration and hypertrophy. Mild foraminal stenosis on the right. L4-5: Solid union following previous fusion procedure. L5-S1: Transitional and unremarkable. IMPRESSION: No acute traumatic finding. Distant fusion at the L4-5 level appears solid without post traumatic finding. Old superior endplate fracture at L1 to the right of midline. This appears healed.  No acute regional fracture. No pars fracture at L2 as was questioned by radiography.  Electronically Signed   By: Nelson Chimes M.D.   On: 02/20/2021 15:50   CT Maxillofacial Wo Contrast  Result Date: 02/20/2021 CLINICAL DATA:  Facial trauma, fall EXAM: CT MAXILLOFACIAL WITHOUT CONTRAST TECHNIQUE: Multidetector CT imaging of the maxillofacial structures was performed. Multiplanar CT image reconstructions were also generated. COMPARISON:  None. FINDINGS: Osseous: No fracture or mandibular dislocation. No destructive process. Orbits: Negative. No traumatic or inflammatory finding. Sinuses: No air-fluid levels. Mucosal thickening in the right maxillary sinus. Soft tissues: Soft tissue laceration noted in the left lateral cheek region. There are small radiopaque densities within the soft tissues compatible with foreign bodies. Limited intracranial: See head CT report IMPRESSION: No facial or orbital fracture. Small radiopaque foreign bodies in the lateral left face laceration. Electronically Signed   By: Rolm Baptise M.D.   On: 02/20/2021 11:38    ____________________________________________   PROCEDURES and INTERVENTIONS  Procedure(s) performed (including Critical Care):  .1-3 Lead EKG Interpretation Performed by: Vladimir Crofts, MD Authorized by: Vladimir Crofts, MD     Interpretation: normal     ECG rate:  70   ECG rate assessment: normal     Rhythm: sinus rhythm     Ectopy: none     Conduction: normal   ..Laceration Repair  Date/Time: 02/20/2021 3:13 PM Performed by: Vladimir Crofts, MD Authorized by: Vladimir Crofts, MD   Consent:    Consent obtained:  Verbal   Consent given by:  Patient   Risks, benefits, and alternatives were discussed: yes   Laceration details:    Location:  Face   Face location:  L cheek   Length (cm):  1.5 Treatment:    Amount of cleaning:  Extensive   Irrigation solution:  Sterile saline   Irrigation method:  Pressure wash   Visualized foreign bodies/material removed: yes   Skin repair:    Repair method:  Sutures   Suture size:  5-0   Suture  material:  Nylon   Suture technique:  Simple interrupted   Number of sutures:  3 Approximation:    Approximation:  Close Repair type:    Repair type:  Simple Post-procedure details:    Dressing:  Open (no dressing)   Procedure completion:  Tolerated well, no immediate complications Comments:     Able to irrigate out few pieces of clear glass from his wound.  Discussed the patient that it appears clean now and I see no evidence of residual foreign body.  We discussed the possibility of residual foreign body and what to look for for this.  He is agreeable.    Medications  lidocaine (PF) (XYLOCAINE) 1 % injection 10 mL (has no administration in time range)  acetaminophen (TYLENOL) tablet 1,000 mg (1,000 mg Oral Given 02/20/21 1308)  oxyCODONE (Oxy IR/ROXICODONE) immediate release tablet 10 mg (10 mg Oral Given 02/20/21 1308)    ____________________________________________   MDM / ED COURSE   60 year old male presents to the ED after fall and possible syncopal episode, with evidence of facial laceration and possible lumbar fracture.  Normal vitals.  Exam reassuring without evidence of neurologic or vascular deficits.  He does have a small laceration on his left cheek, repaired above, but no further evidence of acute trauma.  CT imaging of head and neck is without evidence of ICH or facial fracture, retained foreign body is noted and I believe removed when I repaired the laceration.  Plain film of  the lumbar spine demonstrates possible L2 pars fracture, a follow-up CT is pending the time of signout to oncoming provider.  Considering his reassuring neurologic status, anticipate he be suitable for outpatient management.  Clinical Course as of 02/20/21 1554  Wed Feb 20, 2021  1338 Lac repair complete.  Patient reports feeling much better.  We discussed wound care at home.  We discussed removal of stitches in 7-10 days.  We discussed return precautions for the ED. [DS]  1510 Educated patient of  plain film results of his lumbar spine, recommended CT lumbar spine and he is agreeable.  Patient signed out to oncoming provider [DS]  1553 CT returns while I'm still here. Discuss this with patient and will d/c with return precautions [DS]    Clinical Course User Index [DS] Vladimir Crofts, MD    ____________________________________________   FINAL CLINICAL IMPRESSION(S) / ED DIAGNOSES  Final diagnoses:  Facial laceration, initial encounter  Fall, initial encounter     ED Discharge Orders    None       Clarrisa Kaylor Tamala Julian   Note:  This document was prepared using Dragon voice recognition software and may include unintentional dictation errors.   Vladimir Crofts, MD 02/20/21 [place], [place], MD 02/20/21 [phone]

## 2021-02-20 NOTE — ED Triage Notes (Signed)
Pt comes into the ED via POV c/o fall.  Pt states he was standing at his back door after letting his dog out and then he started feeling very unstable and dizzy.  Pt fell and fell into a glass cabinet.  Pt's head went through the glass part of the cabinet and he lost consciousness.  Pt denies any blood thinner use. Pt has laceration to the left cheek but all bleeding is under control.  Pt able to answer all questions and is A&Ox4.  PT has h/o neuropathy.

## 2021-02-20 NOTE — ED Notes (Addendum)
Pt c/o a "very bad headache." States it has been hurting since he passed out and fell through the glass door. Answers all questions appropriately, however, has repeated a few questions. Pt states he would be unable to stand up due to dizziness at this time. Admitted to taking his prescribed 10/325mg  percocet this am. Is asking for pain med for his head. Will make Dr aware.

## 2021-02-21 ENCOUNTER — Telehealth: Payer: Self-pay | Admitting: Internal Medicine

## 2021-02-21 MED ORDER — ALLOPURINOL 100 MG PO TABS: 100.0000 mg | ORAL_TABLET | Freq: Every day | ORAL | 2 refills | Status: DC

## 2021-02-21 NOTE — Telephone Encounter (Signed)
1.Medication Requested: allopurinol (ZYLOPRIM) 100 MG tablet   2. Pharmacy (Name, [facility], [place]): [facility]  3. On Med List: yes- podiatrist told patient to call PCP to refill medication   4. Last Visit with PCP: 11-26-20  5. Next visit date with PCP: n/a    Agent: Please be advised that RX refills may take up to 3 business days. We ask that you follow-up with your pharmacy.

## 2021-02-21 NOTE — Telephone Encounter (Signed)
Advised pt to call his PCP for further refills, pt verbalized understanding.

## 2021-02-25 DIAGNOSIS — F4312 Post-traumatic stress disorder, chronic: Secondary | ICD-10-CM | POA: Diagnosis not present

## 2021-02-28 DIAGNOSIS — G894 Chronic pain syndrome: Secondary | ICD-10-CM | POA: Diagnosis not present

## 2021-03-19 ENCOUNTER — Other Ambulatory Visit: Payer: Self-pay

## 2021-03-19 ENCOUNTER — Ambulatory Visit (INDEPENDENT_AMBULATORY_CARE_PROVIDER_SITE_OTHER): Payer: Federal, State, Local not specified - PPO | Admitting: Podiatry

## 2021-03-19 ENCOUNTER — Encounter: Payer: Self-pay | Admitting: Podiatry

## 2021-03-19 DIAGNOSIS — L600 Ingrowing nail: Secondary | ICD-10-CM | POA: Diagnosis not present

## 2021-03-19 DIAGNOSIS — L603 Nail dystrophy: Secondary | ICD-10-CM

## 2021-03-19 NOTE — Progress Notes (Signed)
Subjective:  Patient ID: Hector [name], male    DOB: 07/28/1961,  MRN: [id number]  Chief Complaint  Patient presents with   Ingrown Toenail    Right hallux ingrown nail     60 y.o. male presents with the above complaint.  Patient presents with complaint of right hallux medial border ingrown.  Patient states is painful to touch.  Patient states has been going for quite some time he tried to cut himself.  He would like to have removed he has not seen anyone for this treatment.  He denies any other acute complaints.  No clinical signs of infection.  Utilized hydrogen peroxide.   Review of Systems: Negative except as noted in the HPI. Denies N/V/F/Ch.  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    Anxiety    Ascending aortic aneurysm ([place])    a. 06/2018 CT: 4.1cm.   B12 nutritional deficiency    Bipolar disorder ([place])    Chest pain    a. 2007 MV: No ischemia; b. 09/2018 recurrent c/p in setting of PE; c. 09/2018 MV: No ischemia/infarct. EF 55-65%.   Closed head injury    laceration  9 12  no loc  vision change   DDD (degenerative disc disease)    Depression    Diabetes mellitus    resolved with gastric bypass   GERD (gastroesophageal reflux disease)    no meds   Gout    Headache    once per week uses Indomethicin hx of cluster headaches    History of bunionectomy of left great toe    History of concussion    History of echocardiogram    a. 09/2018 Echo: Ef 65-70%, no rwma, mild MR. Ao root 41mm (mildly dil).   History of kidney stones    History of renal stone    Seen in emergency room not urologist   Hyperlipidemia    Hypertension 02/27/2017   Neuromuscular disorder ([place])    Nonspecific pain in the lumbar region    OBESITY 12/10/2007   a. s/p gastric bypass.   OSA  (obstructive sleep apnea) 12/12/2013   resolved by weight loss surgery 10/26/2009   Osteoarthritis of knee    Pain in left knee    Pancreatitis 03/21/2016   went to [place]   Perforated ulcer [facility])    Surgical repair9/24 2011   Peripheral neuropathy    PONV (postoperative nausea and vomiting)    Pulmonary embolism ([place])    a. 09/18/2018 CTA Chest: RLL segmental arterial branch small volume PE-->Eliquis.    Current Outpatient Medications:    albuterol (PROVENTIL HFA;VENTOLIN HFA) 108 (90 Base) MCG/ACT inhaler, Inhale 2 puffs into the lungs every 6 (six) hours as needed for wheezing or shortness of breath., Disp: 1 Inhaler, Rfl: 0   allopurinol (ZYLOPRIM) 100 MG tablet, Take 1 tablet (100 mg total) by mouth daily., Disp: 30 tablet, Rfl: 2   celecoxib (CELEBREX) 200 MG capsule, Take 200 mg by mouth daily., Disp: , Rfl:    colchicine 0.6 MG tablet, TAKE ONE TABLET BY MOUTH DAILY AS NEEDED FOR GOUT, Disp: 30 tablet, Rfl: 1   diazepam (VALIUM) 5 MG tablet, Take 5 mg by mouth every 6 (six) hours as needed for anxiety., Disp: , Rfl:  escitalopram (LEXAPRO) 20 MG tablet, Take 20 mg by mouth every morning. , Disp: , Rfl:    folic acid (FOLVITE) 1 MG tablet, Take 1 tablet (1 mg total) by mouth daily., Disp: 90 tablet, Rfl: 1   HYDROmorphone (DILAUDID) 8 MG tablet, Take 8 mg by mouth 4 (four) times daily as needed for severe pain., Disp: , Rfl:    lamoTRIgine (LAMICTAL) 100 MG tablet, Take 150 mg by mouth 2 (two) times daily. , Disp: , Rfl:    losartan-hydrochlorothiazide (HYZAAR) 50-12.5 MG tablet, Take 1 tablet by mouth daily., Disp: , Rfl:    meloxicam (MOBIC) 15 MG tablet, TAKE ONE TABLET BY MOUTH DAILY, Disp: 30 tablet, Rfl: 0   nitroGLYCERIN (NITROSTAT) 0.3 MG SL tablet, Place 1 tablet (0.3 mg total) under the tongue every 5 (five) minutes as needed for chest pain., Disp: 100 tablet, Rfl: 3   omeprazole (PRILOSEC) 40 MG capsule, Take 40 mg by mouth daily., Disp: , Rfl:     oxyCODONE-acetaminophen (PERCOCET) 10-325 MG tablet, Take 1 tablet by mouth 2 (two) times daily as needed for pain. Takes 1 tablet in the morning and 1 tablet at bedtime, Disp: , Rfl:    pregabalin (LYRICA) 75 MG capsule, Take 75 mg by mouth daily. , Disp: , Rfl:    Spacer/Aero-Holding Chambers (E-Z SPACER) inhaler, Use as instructed, Disp: 1 each, Rfl: 2   tamsulosin (FLOMAX) 0.4 MG CAPS capsule, Take 0.4 mg by mouth daily as needed (kidney stones). , Disp: , Rfl:    thiamine (VITAMIN B-1) 50 MG tablet, Take 1 tablet (50 mg total) by mouth daily., Disp: 90 tablet, Rfl: 1   traZODone (DESYREL) 100 MG tablet, Take 300 mg by mouth at bedtime., Disp: , Rfl:    zinc gluconate 50 MG tablet, Take 1 tablet (50 mg total) by mouth daily., Disp: 90 tablet, Rfl: 1  Social History   Tobacco Use  Smoking Status Never  Smokeless Tobacco Never    Allergies  Allergen Reactions   Aspirin Other (See Comments)    perforated ulcer   Cymbalta [Duloxetine Hcl] Other (See Comments)    Over-sedating; slept over 24 hours with one dose.  Possible serotonin syndrome.   Nsaids Other (See Comments)    Perforated ulcer   Tape Rash    Surgical tape   Hydrocodone Other (See Comments)    headache   Vancomycin Rash    Red man syndrome.  Please premedicate.   Objective:  There were no vitals filed for this visit. There is no height or weight on file to calculate BMI. Constitutional Well developed. Well nourished.  Vascular Dorsalis pedis pulses palpable bilaterally. Posterior tibial pulses palpable bilaterally. Capillary refill normal to all digits.  No cyanosis or clubbing noted. Pedal hair growth normal.  Neurologic Normal speech. Oriented to person, place, and time. Epicritic sensation to light touch grossly present bilaterally.  Dermatologic Painful ingrowing nail at medial nail borders of the hallux nail right. No other open wounds. No skin lesions.  Orthopedic: Normal joint ROM without pain or  crepitus bilaterally. No visible deformities. No bony tenderness.   Radiographs: None Assessment:   1. Ingrown toenail of right foot   2. Nail dystrophy    Plan:  Patient was evaluated and treated and all questions answered.  Ingrown Nail, right with underlying nail dystrophy -Patient elects to proceed with minor surgery to remove ingrown toenail removal today. Consent reviewed and signed by patient. -Ingrown nail excised. See procedure note. -Educated on post-procedure  care including soaking. Written instructions provided and reviewed. -Patient to follow up in 2 weeks for nail check.  Procedure: Excision of Ingrown Toenail Location: Right 1st toe medial nail borders. Anesthesia: Lidocaine 1% plain; 1.5 mL and Marcaine 0.5% plain; 1.5 mL, digital block. Skin Prep: Betadine. Dressing: Silvadene; telfa; dry, sterile, compression dressing. Technique: Following skin prep, the toe was exsanguinated and a tourniquet was secured at the base of the toe. The affected nail border was freed, split with a nail splitter, and excised. Chemical matrixectomy was then performed with phenol and irrigated out with alcohol. The tourniquet was then removed and sterile dressing applied. Disposition: Patient tolerated procedure well. Patient to return in 2 weeks for follow-up.   No follow-ups on file.

## 2021-04-01 ENCOUNTER — Emergency Department: Payer: Federal, State, Local not specified - PPO

## 2021-04-01 ENCOUNTER — Other Ambulatory Visit: Payer: Self-pay

## 2021-04-01 ENCOUNTER — Observation Stay
Admission: EM | Admit: 2021-04-01 | Discharge: 2021-04-03 | Disposition: A | Discharge status: Home / Self Care | Payer: Federal, State, Local not specified - PPO | Attending: Student | Admitting: Student

## 2021-04-01 ENCOUNTER — Observation Stay: Payer: Federal, State, Local not specified - PPO

## 2021-04-01 ENCOUNTER — Encounter: Payer: Self-pay | Admitting: Emergency Medicine

## 2021-04-01 DIAGNOSIS — W19XXXA Unspecified fall, initial encounter: Secondary | ICD-10-CM

## 2021-04-01 DIAGNOSIS — R519 Headache, unspecified: Secondary | ICD-10-CM | POA: Diagnosis not present

## 2021-04-01 DIAGNOSIS — K219 Gastro-esophageal reflux disease without esophagitis: Secondary | ICD-10-CM | POA: Diagnosis present

## 2021-04-01 DIAGNOSIS — E861 Hypovolemia: Secondary | ICD-10-CM

## 2021-04-01 DIAGNOSIS — D696 Thrombocytopenia, unspecified: Secondary | ICD-10-CM | POA: Diagnosis not present

## 2021-04-01 DIAGNOSIS — G4733 Obstructive sleep apnea (adult) (pediatric): Secondary | ICD-10-CM | POA: Diagnosis present

## 2021-04-01 DIAGNOSIS — R296 Repeated falls: Secondary | ICD-10-CM | POA: Diagnosis not present

## 2021-04-01 DIAGNOSIS — Z7901 Long term (current) use of anticoagulants: Secondary | ICD-10-CM | POA: Diagnosis not present

## 2021-04-01 DIAGNOSIS — Z9889 Other specified postprocedural states: Secondary | ICD-10-CM | POA: Diagnosis not present

## 2021-04-01 DIAGNOSIS — Z79899 Other long term (current) drug therapy: Secondary | ICD-10-CM | POA: Insufficient documentation

## 2021-04-01 DIAGNOSIS — D538 Other specified nutritional anemias: Secondary | ICD-10-CM | POA: Diagnosis present

## 2021-04-01 DIAGNOSIS — E519 Thiamine deficiency, unspecified: Secondary | ICD-10-CM | POA: Diagnosis present

## 2021-04-01 DIAGNOSIS — I959 Hypotension, unspecified: Secondary | ICD-10-CM | POA: Diagnosis not present

## 2021-04-01 DIAGNOSIS — N179 Acute kidney failure, unspecified: Secondary | ICD-10-CM | POA: Diagnosis not present

## 2021-04-01 DIAGNOSIS — I9589 Other hypotension: Secondary | ICD-10-CM

## 2021-04-01 DIAGNOSIS — R4182 Altered mental status, unspecified: Secondary | ICD-10-CM | POA: Diagnosis not present

## 2021-04-01 DIAGNOSIS — G4731 Primary central sleep apnea: Secondary | ICD-10-CM

## 2021-04-01 DIAGNOSIS — I10 Essential (primary) hypertension: Secondary | ICD-10-CM | POA: Diagnosis not present

## 2021-04-01 DIAGNOSIS — M79672 Pain in left foot: Secondary | ICD-10-CM

## 2021-04-01 DIAGNOSIS — E119 Type 2 diabetes mellitus without complications: Secondary | ICD-10-CM | POA: Insufficient documentation

## 2021-04-01 DIAGNOSIS — Z20822 Contact with and (suspected) exposure to covid-19: Secondary | ICD-10-CM | POA: Diagnosis not present

## 2021-04-01 DIAGNOSIS — R410 Disorientation, unspecified: Secondary | ICD-10-CM | POA: Diagnosis not present

## 2021-04-01 DIAGNOSIS — I7121 Aneurysm of the ascending aorta, without rupture: Secondary | ICD-10-CM | POA: Diagnosis present

## 2021-04-01 DIAGNOSIS — Y9302 Activity, running: Secondary | ICD-10-CM

## 2021-04-01 DIAGNOSIS — I712 Thoracic aortic aneurysm, without rupture: Secondary | ICD-10-CM | POA: Diagnosis not present

## 2021-04-01 DIAGNOSIS — R29818 Other symptoms and signs involving the nervous system: Secondary | ICD-10-CM | POA: Diagnosis not present

## 2021-04-01 DIAGNOSIS — E538 Deficiency of other specified B group vitamins: Secondary | ICD-10-CM | POA: Diagnosis present

## 2021-04-01 DIAGNOSIS — R55 Syncope and collapse: Secondary | ICD-10-CM

## 2021-04-01 LAB — HEPATIC FUNCTION PANEL
ALT: 15 U/L (ref 0–44)
AST: 42 U/L — ABNORMAL HIGH (ref 15–41)
Albumin: 3.6 g/dL (ref 3.5–5.0)
Alkaline Phosphatase: 79 U/L (ref 38–126)
Bilirubin, Direct: 0.1 mg/dL (ref 0.0–0.2)
Indirect Bilirubin: 0.5 mg/dL (ref 0.3–0.9)
Total Bilirubin: 0.6 mg/dL (ref 0.3–1.2)
Total Protein: 6.5 g/dL (ref 6.5–8.1)

## 2021-04-01 LAB — CBC WITH DIFFERENTIAL/PLATELET
Abs Immature Granulocytes: 0.01 10*3/uL (ref 0.00–0.07)
Basophils Absolute: 0 10*3/uL (ref 0.0–0.1)
Basophils Relative: 1 %
Eosinophils Absolute: 0.1 10*3/uL (ref 0.0–0.5)
Eosinophils Relative: 2 %
HCT: 35.4 % — ABNORMAL LOW (ref 39.0–52.0)
Hemoglobin: 11.3 g/dL — ABNORMAL LOW (ref 13.0–17.0)
Immature Granulocytes: 0 %
Lymphocytes Relative: 35 %
Lymphs Abs: 1.4 10*3/uL (ref 0.7–4.0)
MCH: 31.2 pg (ref 26.0–34.0)
MCHC: 31.9 g/dL (ref 30.0–36.0)
MCV: 97.8 fL (ref 80.0–100.0)
Monocytes Absolute: 0.4 10*3/uL (ref 0.1–1.0)
Monocytes Relative: 10 %
Neutro Abs: 2.1 10*3/uL (ref 1.7–7.7)
Neutrophils Relative %: 52 %
Platelets: 101 10*3/uL — ABNORMAL LOW (ref 150–400)
RBC: 3.62 MIL/uL — ABNORMAL LOW (ref 4.22–5.81)
RDW: 14.6 % (ref 11.5–15.5)
WBC: 3.9 10*3/uL — ABNORMAL LOW (ref 4.0–10.5)
nRBC: 0 % (ref 0.0–0.2)

## 2021-04-01 LAB — URINALYSIS, COMPLETE (UACMP) WITH MICROSCOPIC
Bacteria, UA: NONE SEEN
Bilirubin Urine: NEGATIVE
Glucose, UA: NEGATIVE mg/dL
Hgb urine dipstick: NEGATIVE
Ketones, ur: NEGATIVE mg/dL
Leukocytes,Ua: NEGATIVE
Nitrite: NEGATIVE
Protein, ur: NEGATIVE mg/dL
Specific Gravity, Urine: 1.012 (ref 1.005–1.030)
pH: 5 (ref 5.0–8.0)

## 2021-04-01 LAB — URINE DRUG SCREEN, QUALITATIVE (ARMC ONLY)
Amphetamines, Ur Screen: NOT DETECTED
Barbiturates, Ur Screen: NOT DETECTED
Benzodiazepine, Ur Scrn: NOT DETECTED
Cannabinoid 50 Ng, Ur ~~LOC~~: NOT DETECTED
Cocaine Metabolite,Ur ~~LOC~~: NOT DETECTED
MDMA (Ecstasy)Ur Screen: NOT DETECTED
Methadone Scn, Ur: NOT DETECTED
Opiate, Ur Screen: POSITIVE — AB
Phencyclidine (PCP) Ur S: NOT DETECTED
Tricyclic, Ur Screen: POSITIVE — AB

## 2021-04-01 LAB — BASIC METABOLIC PANEL
Anion gap: 9 (ref 5–15)
BUN: 13 mg/dL (ref 6–20)
CO2: 23 mmol/L (ref 22–32)
Calcium: 8.4 mg/dL — ABNORMAL LOW (ref 8.9–10.3)
Chloride: 106 mmol/L (ref 98–111)
Creatinine, Ser: 2.14 mg/dL — ABNORMAL HIGH (ref 0.61–1.24)
GFR, Estimated: 35 mL/min — ABNORMAL LOW (ref >60)
Glucose, Bld: 178 mg/dL — ABNORMAL HIGH (ref 70–99)
Potassium: 3.7 mmol/L (ref 3.5–5.1)
Sodium: 138 mmol/L (ref 135–145)

## 2021-04-01 LAB — AMMONIA: Ammonia: 9 umol/L (ref 9–35)

## 2021-04-01 LAB — CBC
HCT: 35.6 % — ABNORMAL LOW (ref 39.0–52.0)
Hemoglobin: 11.4 g/dL — ABNORMAL LOW (ref 13.0–17.0)
MCH: 31.4 pg (ref 26.0–34.0)
MCHC: 32 g/dL (ref 30.0–36.0)
MCV: 98.1 fL (ref 80.0–100.0)
Platelets: 100 10*3/uL — ABNORMAL LOW (ref 150–400)
RBC: 3.63 MIL/uL — ABNORMAL LOW (ref 4.22–5.81)
RDW: 14.5 % (ref 11.5–15.5)
WBC: 4 10*3/uL (ref 4.0–10.5)
nRBC: 0 % (ref 0.0–0.2)

## 2021-04-01 LAB — RESP PANEL BY RT-PCR (FLU A&B, COVID) ARPGX2
Influenza A by PCR: NEGATIVE
Influenza B by PCR: NEGATIVE
SARS Coronavirus 2 by RT PCR: NEGATIVE

## 2021-04-01 LAB — BRAIN NATRIURETIC PEPTIDE: B Natriuretic Peptide: 61.4 pg/mL (ref 0.0–100.0)

## 2021-04-01 LAB — SALICYLATE LEVEL: Salicylate Lvl: <7 mg/dL — ABNORMAL LOW (ref 7.0–30.0)

## 2021-04-01 LAB — TROPONIN I (HIGH SENSITIVITY)
Troponin I (High Sensitivity): 8 ng/L (ref <18)
Troponin I (High Sensitivity): 8 ng/L (ref <18)

## 2021-04-01 LAB — MAGNESIUM: Magnesium: 1.5 mg/dL — ABNORMAL LOW (ref 1.7–2.4)

## 2021-04-01 LAB — LIPASE, BLOOD: Lipase: 61 U/L — ABNORMAL HIGH (ref 11–51)

## 2021-04-01 LAB — HIV ANTIBODY (ROUTINE TESTING W REFLEX): HIV Screen 4th Generation wRfx: NONREACTIVE

## 2021-04-01 LAB — TSH: TSH: 0.525 u[IU]/mL (ref 0.350–4.500)

## 2021-04-01 LAB — LACTIC ACID, PLASMA
Lactic Acid, Venous: 2.3 mmol/L (ref 0.5–1.9)
Lactic Acid, Venous: 1.6 mmol/L (ref 0.5–1.9)

## 2021-04-01 LAB — VITAMIN B12: Vitamin B-12: 1389 pg/mL — ABNORMAL HIGH (ref 180–914)

## 2021-04-01 MED ORDER — ACETAMINOPHEN 325 MG PO TABS: 650.0000 mg | ORAL_TABLET | Freq: Four times a day (QID) | ORAL | Status: DC | PRN

## 2021-04-01 MED ORDER — TRAZODONE HCL 100 MG PO TABS
300.0000 mg | ORAL_TABLET | Freq: Every day | ORAL | Status: DC
  Administered 2021-04-01 – 2021-04-02 (×2): 300 mg via ORAL
  Filled 2021-04-01 (×2): qty 3

## 2021-04-01 MED ORDER — MORPHINE SULFATE (PF) 2 MG/ML IV SOLN
2.0000 mg | INTRAVENOUS | Status: AC | PRN
  Administered 2021-04-01 – 2021-04-02 (×4): 2 mg via INTRAVENOUS
  Filled 2021-04-01 (×4): qty 1

## 2021-04-01 MED ORDER — ESCITALOPRAM OXALATE 20 MG PO TABS
20.0000 mg | ORAL_TABLET | Freq: Every morning | ORAL | Status: DC
  Administered 2021-04-02 – 2021-04-03 (×2): 20 mg via ORAL
  Filled 2021-04-01 (×2): qty 1

## 2021-04-01 MED ORDER — DIAZEPAM 5 MG PO TABS: 5.0000 mg | ORAL_TABLET | Freq: Four times a day (QID) | ORAL | Status: DC | PRN

## 2021-04-01 MED ORDER — ONDANSETRON HCL 4 MG/2ML IJ SOLN
4.0000 mg | Freq: Once | INTRAMUSCULAR | Status: AC
  Administered 2021-04-01: 4 mg via INTRAVENOUS
  Filled 2021-04-01: qty 2

## 2021-04-01 MED ORDER — SODIUM CHLORIDE 0.9 % IV BOLUS
1000.0000 mL | Freq: Once | INTRAVENOUS | Status: AC
  Administered 2021-04-01: 1000 mL via INTRAVENOUS

## 2021-04-01 MED ORDER — ONDANSETRON HCL 4 MG/2ML IJ SOLN
4.0000 mg | Freq: Four times a day (QID) | INTRAMUSCULAR | Status: DC | PRN
Start: 2021-04-01 — End: 2021-04-03

## 2021-04-01 MED ORDER — LACTATED RINGERS IV SOLN: INTRAVENOUS | Status: AC

## 2021-04-01 MED ORDER — HYDRALAZINE HCL 25 MG PO TABS
25.0000 mg | ORAL_TABLET | Freq: Three times a day (TID) | ORAL | Status: DC | PRN
Start: 2021-04-01 — End: 2021-04-03

## 2021-04-01 MED ORDER — ACETAMINOPHEN 650 MG RE SUPP: 650.0000 mg | Freq: Four times a day (QID) | RECTAL | Status: DC | PRN

## 2021-04-01 MED ORDER — MORPHINE SULFATE (PF) 2 MG/ML IV SOLN
2.0000 mg | Freq: Once | INTRAVENOUS | Status: AC
  Administered 2021-04-01: 2 mg via INTRAVENOUS
  Filled 2021-04-01: qty 1

## 2021-04-01 MED ORDER — HEPARIN SODIUM (PORCINE) 5000 UNIT/ML IJ SOLN: 5000.0000 [IU] | Freq: Three times a day (TID) | INTRAMUSCULAR | Status: DC

## 2021-04-01 MED ORDER — PREGABALIN 75 MG PO CAPS
150.0000 mg | ORAL_CAPSULE | Freq: Two times a day (BID) | ORAL | Status: DC
  Administered 2021-04-01 – 2021-04-03 (×4): 150 mg via ORAL
  Filled 2021-04-01 (×4): qty 2

## 2021-04-01 MED ORDER — ALLOPURINOL 100 MG PO TABS
100.0000 mg | ORAL_TABLET | Freq: Every day | ORAL | Status: DC
  Administered 2021-04-02 – 2021-04-03 (×2): 100 mg via ORAL
  Filled 2021-04-01 (×2): qty 1

## 2021-04-01 MED ORDER — MAGNESIUM SULFATE IN D5W 1-5 GM/100ML-% IV SOLN
1.0000 g | Freq: Once | INTRAVENOUS | Status: AC
  Administered 2021-04-01: 1 g via INTRAVENOUS
  Filled 2021-04-01 (×2): qty 100

## 2021-04-01 MED ORDER — FOLIC ACID 1 MG PO TABS
1.0000 mg | ORAL_TABLET | Freq: Every day | ORAL | Status: DC
  Administered 2021-04-02 – 2021-04-03 (×2): 1 mg via ORAL
  Filled 2021-04-01 (×2): qty 1

## 2021-04-01 MED ORDER — ALBUTEROL SULFATE HFA 108 (90 BASE) MCG/ACT IN AERS: 2.0000 | INHALATION_SPRAY | Freq: Four times a day (QID) | RESPIRATORY_TRACT | Status: DC | PRN

## 2021-04-01 MED ORDER — TAMSULOSIN HCL 0.4 MG PO CAPS: 0.4000 mg | ORAL_CAPSULE | Freq: Every day | ORAL | Status: DC | PRN

## 2021-04-01 MED ORDER — ONDANSETRON HCL 4 MG PO TABS
4.0000 mg | ORAL_TABLET | Freq: Four times a day (QID) | ORAL | Status: DC | PRN
Start: 2021-04-01 — End: 2021-04-03

## 2021-04-01 MED ORDER — NITROGLYCERIN 0.3 MG SL SUBL
0.3000 mg | SUBLINGUAL_TABLET | SUBLINGUAL | Status: DC | PRN
  Filled 2021-04-01: qty 100

## 2021-04-01 MED ORDER — ALBUTEROL SULFATE (2.5 MG/3ML) 0.083% IN NEBU: 2.5000 mg | INHALATION_SOLUTION | Freq: Four times a day (QID) | RESPIRATORY_TRACT | Status: DC | PRN

## 2021-04-01 MED ORDER — PANTOPRAZOLE SODIUM 40 MG PO TBEC
80.0000 mg | DELAYED_RELEASE_TABLET | Freq: Every day | ORAL | Status: DC
  Administered 2021-04-02 – 2021-04-03 (×2): 80 mg via ORAL
  Filled 2021-04-01 (×2): qty 2

## 2021-04-01 MED ORDER — LAMOTRIGINE 25 MG PO TABS
150.0000 mg | ORAL_TABLET | Freq: Two times a day (BID) | ORAL | Status: DC
  Administered 2021-04-01 – 2021-04-03 (×4): 150 mg via ORAL
  Filled 2021-04-01 (×3): qty 6

## 2021-04-01 MED ORDER — VITAMIN D 25 MCG (1000 UNIT) PO TABS
1000.0000 [IU] | ORAL_TABLET | Freq: Every day | ORAL | Status: DC
  Administered 2021-04-02: 1000 [IU] via ORAL
  Filled 2021-04-01 (×2): qty 1

## 2021-04-01 NOTE — ED Notes (Signed)
Family stated he has been feeling dizzy, very disoriented, blurred vision, and slurred speech. Pt has had multiple falls in the past.

## 2021-04-01 NOTE — ED Triage Notes (Addendum)
Fall about one month ago, has been unsteady since.  Wife states patient fell 5 times yesterday.  C/O worsening balance, seeing ' bright light and double vision' intermittently.  Also some disorientation  Patient is AAOx3.

## 2021-04-01 NOTE — ED Provider Notes (Signed)
[facility] Emergency Department Provider Note  ____________________________________________   Event Date/Time   First MD Initiated Contact with Patient 04/01/21 1528     (approximate)  I have reviewed the triage vital signs and the nursing notes.   HISTORY  Chief Complaint Fall   HPI Hector [name] is a 60 y.o. male patient seen recently for fall about a month ago.  He has been unsteady since.  His wife says that he fell 5 times yesterday.  Has occasional episodes of seeing bright and double vision.  He is not having this problem right now.  He reports a bad headache that started sometime either just before just after one of the falls.  His wife reports he also had an episode episode of shaking like a seizure but was not completely unconscious during that time.  He was disoriented for some time afterwards 5 to 10 minutes.  We received a call saying he may be overusing his pain meds although he denies it.        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    Anxiety    Ascending aortic aneurysm ([place])    a. 06/2018 CT: 4.1cm.   B12 nutritional deficiency    Bipolar disorder ([place])    Chest pain    a. 2007 MV: No ischemia; b. 09/2018 recurrent c/p in setting of PE; c. 09/2018 MV: No ischemia/infarct. EF 55-65%.   Closed head injury    laceration  9 12  no loc  vision change   DDD (degenerative disc disease)    Depression    Diabetes mellitus    resolved with gastric bypass   GERD (gastroesophageal reflux disease)    no meds   Gout    Headache    once per week uses Indomethicin hx of cluster headaches    History of bunionectomy of left great toe    History of concussion    History of echocardiogram    a. 09/2018 Echo: Ef 65-70%, no rwma, mild MR. Ao  root 67mm (mildly dil).   History of kidney stones    History of renal stone    Seen in emergency room not urologist   Hyperlipidemia    Hypertension 02/27/2017   Neuromuscular disorder ([place])    Nonspecific pain in the lumbar region    OBESITY 12/10/2007   a. s/p gastric bypass.   OSA (obstructive sleep apnea) 12/12/2013   resolved by weight loss surgery 10/26/2009   Osteoarthritis of knee    Pain in left knee    Pancreatitis 03/21/2016   went to [place]   Perforated ulcer [facility])    Surgical repair9/24 2011   Peripheral neuropathy    PONV (postoperative nausea and vomiting)    Pulmonary embolism ([place])    a. 09/18/2018 CTA Chest: RLL segmental arterial branch small volume PE-->Eliquis.    Patient Active Problem List   Diagnosis Date Noted   Anemia due to zinc deficiency 12/02/2020   Anemia due to acquired thiamine deficiency 12/02/2020   Deficiency anemia 11/26/2020   Elevated LFTs 11/26/2020   Encounter for general adult medical examination with abnormal findings 11/26/2020   Dietary folate deficiency anemia 11/26/2020   Ascending aortic aneurysm ([facility])    Fusion  of lumbar spine 03/29/2020   Osteoarthritis of knee 11/02/2018   Chest pain 09/26/2018   Pulmonary embolism ([place]) 09/24/2018   Hypertension 02/27/2017   Chronic low back pain 11/24/2016   Perennial allergic rhinitis 11/26/2015   Renal calculi    Spinal stenosis, lumbar region, 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   Type II or unspecified type diabetes mellitus without mention of complication, not stated as uncontrolled 05/15/2014   Lumbar back pain with radiculopathy affecting left lower extremity 05/15/2014   Medication management 05/15/2014   Insufficient treatment with nasal CPAP 04/24/2014   Sleep apnea, 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   OSA (obstructive sleep apnea) 12/12/2013   History of Roux-en-Y gastric bypass, 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   Colon cancer screening 12/28/2012   GERD (gastroesophageal reflux  disease) 12/28/2012   Thrombocytopenia ([place]) 05/24/2010   GOUT 08/02/2007   B12 deficiency 04/06/2007   CARPAL TUNNEL SYNDROME 04/06/2007    Past Surgical History:  Procedure Laterality Date   ABDOMINAL EXPOSURE N/A 03/29/2020   Procedure: ABDOMINAL EXPOSURE;  Surgeon: Rosetta Posner, MD;  [facility] OR;  Service: Vascular;  Laterality: N/A;   ANTERIOR LUMBAR FUSION N/A 03/29/2020   Procedure: ANTERIOR LUMBAR FUSION (ALIF) L4-5;  Surgeon: Melina Schools, MD;  Location: [place];  Service: Orthopedics;  Laterality: N/A;  5.5 hrs Dr. Donnetta Hutching do approach Lt tap block with exparel   BACK SURGERY  2012   Dr. Dorina Hoyer disk   CARPAL TUNNEL RELEASE  2008   left-ulnar nerve decomp   carpel tunnel Left    CHOLECYSTECTOMY N/A 03/21/2016   Procedure: LAPAROSCOPIC CHOLECYSTECTOMY WITH INTRAOPERATIVE CHOLANGIOGRAM;  Surgeon: Ralene Ok, MD;  Location: [facility] ORS;  Service: General;  Laterality: N/A;   colonscopy      DEBRIDEMENT AND CLOSURE WOUND N/A 05/09/2020   Procedure: Irrigation and debridement of superficial wound closure;  Surgeon: Melina Schools, MD;  Location: [place];  Service: Orthopedics;  Laterality: N/A;  1 hr   DIAGNOSTIC LAPAROSCOPY     laparoscopic gastric bypass   EPIDURAL BLOCK INJECTION     by Dr. Nelva Bush   FOOT SURGERY     bilat for removal of extra bone    GASTRIC BYPASS  11/06/09   beginning weight 267   HARDWARE REMOVAL N/A 05/02/2016   Procedure: Removal of hardware Lumbar five-Sacral one with Metrex;  Surgeon: Kristeen Miss, MD;  Location: [facility];  Service: Neurosurgery;  Laterality: N/A;  Removal of hardware L5-S1 with Metrex   HERNIA REPAIR  6468   umbilical   HIATAL HERNIA REPAIR N/A 01/20/2013   Procedure: LAPAROSCOPIC REPAIR OF INTERNAL HERNIA;  Surgeon: Shann Medal, MD;  Location: [facility];  Service: General;  Laterality: N/A;   LUMBAR LAMINECTOMY/DECOMPRESSION MICRODISCECTOMY Left 06/07/2014   Procedure: MICRO LUMBAR DECOMPRESSION L5 - S1 ON THE LEFT/L5  FORAMINOTOMY/EXCISION SYNOVIAL CYST  1 LEVEL;  Surgeon: [name], MD;  Location: [facility];  Service: Orthopedics;  Laterality: Left;   MASS EXCISION Right 04/18/2013   Procedure: RIGHT SMALL FINGER SKIN LESION EXCISION;  Surgeon: Jolyn Nap, MD;  Location: [place];  Service: Orthopedics;  Laterality: Right;   NASAL SEPTOPLASTY W/ TURBINOPLASTY     x 2   NEPHROLITHOTOMY Right 11/03/2014   Procedure: RIGHT PERCUTANEOUS NEPHROLITHOTOMY;  Surgeon: Claybon Jabs, MD;  Location: [facility];  Service: Urology;  Laterality: Right;   SHOULDER ARTHROSCOPY  2014   rt   surgerical repair of stomach ulcer  06/08/10   per   TONSILLECTOMY  UVULOPALATOPHARYNGOPLASTY  2007   with tonsils   VASECTOMY      Prior to Admission medications   Medication Sig Start Date End Date Taking? Authorizing Provider  albuterol (PROVENTIL HFA;VENTOLIN HFA) 108 (90 Base) MCG/ACT inhaler Inhale 2 puffs into the lungs every 6 (six) hours as needed for wheezing or shortness of breath. 09/03/18   Caren Macadam, MD  allopurinol (ZYLOPRIM) 100 MG tablet Take 1 tablet (100 mg total) by mouth daily. 02/21/21   Janith Lima, MD  celecoxib (CELEBREX) 200 MG capsule Take 200 mg by mouth daily. 07/19/20   [provider]  colchicine 0.6 MG tablet TAKE ONE TABLET BY MOUTH DAILY AS NEEDED FOR GOUT 08/07/20   Felipa Furnace, DPM  diazepam (VALIUM) 5 MG tablet Take 5 mg by mouth every 6 (six) hours as needed for anxiety.    [provider]  escitalopram (LEXAPRO) 20 MG tablet Take 20 mg by mouth every morning.     [provider]  folic acid (FOLVITE) 1 MG tablet Take 1 tablet (1 mg total) by mouth daily. 11/26/20   Janith Lima, MD  HYDROmorphone (DILAUDID) 8 MG tablet Take 8 mg by mouth 4 (four) times daily as needed for severe pain.    [provider]  lamoTRIgine (LAMICTAL) 100 MG tablet Take 150 mg by mouth 2 (two) times daily.     [provider]   losartan-hydrochlorothiazide (HYZAAR) 50-12.5 MG tablet Take 1 tablet by mouth daily. 03/17/19   [provider]  meloxicam (MOBIC) 15 MG tablet TAKE ONE TABLET BY MOUTH DAILY 11/07/20   Felipa Furnace, DPM  nitroGLYCERIN (NITROSTAT) 0.3 MG SL tablet Place 1 tablet (0.3 mg total) under the tongue every 5 (five) minutes as needed for chest pain. 09/27/18 03/13/20  Epifanio Lesches, MD  omeprazole (PRILOSEC) 40 MG capsule Take 40 mg by mouth daily. 08/03/20   [provider]  oxyCODONE-acetaminophen (PERCOCET) 10-325 MG tablet Take 1 tablet by mouth 2 (two) times daily as needed for pain. Takes 1 tablet in the morning and 1 tablet at bedtime    [provider]  pregabalin (LYRICA) 75 MG capsule Take 75 mg by mouth daily.     [provider]  Spacer/Aero-Holding Chambers (E-Z SPACER) inhaler Use as instructed 09/03/18   Caren Macadam, MD  tamsulosin (FLOMAX) 0.4 MG CAPS capsule Take 0.4 mg by mouth daily as needed (kidney stones).     [provider]  thiamine (VITAMIN B-1) 50 MG tablet Take 1 tablet (50 mg total) by mouth daily. 12/02/20   Janith Lima, MD  traZODone (DESYREL) 100 MG tablet Take 300 mg by mouth at bedtime. 07/17/19   [provider]  zinc gluconate 50 MG tablet Take 1 tablet (50 mg total) by mouth daily. 12/02/20   Janith Lima, MD  enoxaparin (LOVENOX) 40 MG/0.4ML injection Inject 0.4 mLs (40 mg total) into the skin daily. 10 day supply 1 injection per day Patient not taking: No sig reported 03/29/20 10/17/20  Melina Schools, MD    Allergies Aspirin, Cymbalta [duloxetine hcl], Nsaids, Tape, Hydrocodone, and Vancomycin  Family History  Problem Relation Age of Onset   Uterine cancer Mother    Hypertension Mother    Cervical cancer Mother    Heart disease Father    COPD Father    Seizures Father    Arthritis Father    Arthritis Sister    Diabetes Sister    Colon cancer Maternal Uncle  Colon cancer Paternal Aunt     Diabetes Paternal Aunt    Heart disease Maternal Grandmother    Heart disease Maternal Grandfather    Heart disease Paternal Grandmother    Heart disease Paternal Grandfather    Diabetes Paternal Uncle     Social History Social History   Tobacco Use   Smoking status: Never   Smokeless tobacco: Never  Vaping Use   Vaping Use: Never used  Substance Use Topics   Alcohol use: Yes    Comment: occas liquor    Drug use: No    Review of Systems  Constitutional: No fever/chills Eyes: Currently no visual changes. ENT: No sore throat. Cardiovascular: Denies chest pain. Respiratory: Denies shortness of breath. Gastrointestinal: No abdominal pain.  No nausea, no vomiting.  No diarrhea.  No constipation. Genitourinary: Negative for dysuria. Musculoskeletal: Negative for back pain. Skin: Negative for rash. Neurological: Negative for focal weakness   ____________________________________________   PHYSICAL EXAM:  VITAL SIGNS: ED Triage Vitals  Enc Vitals Group     BP 04/01/21 1503 (!) 70/48     Pulse Rate 04/01/21 1501 93     Resp 04/01/21 1501 16     Temp 04/01/21 1501 98.4 F (36.9 C)     Temp Source 04/01/21 1501 Oral     SpO2 04/01/21 1501 97 %     Weight 04/01/21 1504 160 lb 0.9 oz (72.6 kg)     Height 04/01/21 1504 5\' 10"  (1.610 m)     Head Circumference --      Peak Flow --      Pain Score 04/01/21 1503 7     Pain Loc --      Pain Edu? --      Excl. in [place]? --     Constitutional: Awake somewhat groggy Eyes: Conjunctivae are normal. PERRL. EOMI. Head: Atraumatic. Nose: No congestion/rhinnorhea. Mouth/Throat: Mucous membranes are moist.  Oropharynx non-erythematous. Neck: No stridor. No cervical spine tenderness to palpation Cardiovascular: Normal rate, regular rhythm. Grossly normal heart sounds.  Good peripheral circulation. Respiratory: Normal respiratory effort.  No retractions. Lungs CTAB. Gastrointestinal: Soft and nontender. No distention. No  abdominal bruits. No CVA tenderness. Musculoskeletal: No lower extremity tenderness nor edema.  Neurologic:  Normal speech and language. No gross focal neurologic deficits are appreciated.  Cranial nerves II through XII are intact motor strength appears to be 5/5 throughout cerebellar is very slow but equal bilaterally and there is no tremor. Skin:  Skin is warm, dry and intact. No rash noted.   ____________________________________________   LABS (all labs ordered are listed, but only abnormal results are displayed)  Labs Reviewed  BASIC METABOLIC PANEL - Abnormal; Notable for the following components:      Result Value   Glucose, Bld 178 (*)    Creatinine, Ser 2.14 (*)    Calcium 8.4 (*)    GFR, Estimated 35 (*)    All other components within normal limits  CBC - Abnormal; Notable for the following components:   RBC 3.63 (*)    Hemoglobin 11.4 (*)    HCT 35.6 (*)    Platelets 100 (*)    All other components within normal limits  HEPATIC FUNCTION PANEL - Abnormal; Notable for the following components:   AST 42 (*)    All other components within normal limits  LIPASE, BLOOD - Abnormal; Notable for the following components:   Lipase 61 (*)    All other components within normal limits  CBC WITH DIFFERENTIAL/PLATELET -  Abnormal; Notable for the following components:   WBC 3.9 (*)    RBC 3.62 (*)    Hemoglobin 11.3 (*)    HCT 35.4 (*)    Platelets 101 (*)    All other components within normal limits  BRAIN NATRIURETIC PEPTIDE  LACTIC ACID, PLASMA  LACTIC ACID, PLASMA  URINALYSIS, COMPLETE (UACMP) WITH MICROSCOPIC  TROPONIN I (HIGH SENSITIVITY)   ____________________________________________  EKG  ____________________________________________  RADIOLOGY Gertha Calkin, personally viewed and evaluated these images (plain radiographs) as part of my medical decision making, as well as reviewing the written report by the radiologist.  ED MD interpretation: CT of the head  and neck and chest x-ray read by radiology reviewed by me and do not see any signs of acute pathology or hemorrhage and neither does the radiologist.  Official radiology report(s): DG Chest 2 View  Result Date: 04/01/2021 CLINICAL DATA:  Hypotension, migraine EXAM: CHEST - 2 VIEW COMPARISON:  10/17/2020 FINDINGS: Frontal and lateral views of the chest demonstrate an unremarkable cardiac silhouette. No acute airspace disease, effusion, or pneumothorax. There are no acute bony abnormalities. IMPRESSION: 1. No acute intrathoracic process. Electronically Signed   By: Randa Ngo M.D.   On: 04/01/2021 16:32   CT Head Wo Contrast  Result Date: 04/01/2021 CLINICAL DATA:  Head trauma, moderate/severe. Additional provided: Patient has fallen multiple times in the last 24 hours, severe headache. Fall 1 month ago as well. Unsteadiness since that time. Patient's wife reports patient fell 5 times yesterday. Patient reports worsening balance, seen "bright light and double vision intermittently." Disorientation. EXAM: CT HEAD WITHOUT CONTRAST CT CERVICAL SPINE WITHOUT CONTRAST TECHNIQUE: Multidetector CT imaging of the head and cervical spine was performed following the standard protocol without intravenous contrast. Multiplanar CT image reconstructions of the cervical spine were also generated. COMPARISON:  Prior head CT examinations 02/20/2021 and earlier. CT of the cervical spine 02/20/2021. Brain MRI 05/09/2015 FINDINGS: CT HEAD FINDINGS Brain: Cerebral volume is normal. Redemonstrated 9 mm cystic focus within the medial left basal ganglia, unchanged in size as compared to the head CT of 02/20/2021. This may reflect a perivascular space or neural glial cyst. There is no acute intracranial hemorrhage. No demarcated cortical infarct. No extra-axial fluid collection. No evidence of an intracranial mass. No midline shift. Vascular: No hyperdense vessel.  Atherosclerotic calcifications. Skull: Normal. Negative for  fracture or focal lesion. Sinuses/Orbits: Visualized orbits show no acute finding. Mild bilateral ethmoid sinus mucosal thickening. Partially imaged right maxillary sinus mucous retention cyst. Additionally, mild mucosal thickening is present within the right maxillary sinus at the imaged levels. CT CERVICAL SPINE FINDINGS Alignment: Mild cervical levocurvature. Straightening of the expected cervical lordosis. No signal spondylolisthesis. Skull base and vertebrae: The basion-dental and atlanto-dental intervals are maintained.No evidence of acute fracture to the cervical spine. Soft tissues and spinal canal: No prevertebral fluid or swelling. No visible canal hematoma. Disc levels: Redemonstrated sequela of prior C5-C6 and C6-C7 disc replacement. Cervical spondylosis with multilevel disc space narrowing and shallow multilevel disc bulges. Disc space narrowing is greatest at C4-C5 (mild-to-moderate). No appreciable high-grade spinal canal stenosis. No compressive bony neural foraminal narrowing. Upper chest: No consolidation within the imaged lung apices. No visible pneumothorax. IMPRESSION: CT head: 1. No evidence of acute intracranial abnormality. 2. Redemonstrated 9 mm cystic focus within the medial left basal ganglia. In correlating with the findings on the prior brain MRI of 05/09/2015, this may reflect a prominent perivascular space or neural glial cyst. 3. Paranasal sinus  disease at the imaged levels, as described. CT cervical spine: 1. No evidence of acute fracture to the cervical spine. 2. Straightening of the expected cervical lordosis. 3. Mild cervical levocurvature. 4. Redemonstrated sequela of prior C5-C6 and C6-C7 disc replacement. 5. Cervical spondylosis, as described. Electronically Signed   By: Kellie Simmering DO   On: 04/01/2021 16:36   CT Cervical Spine Wo Contrast  Result Date: 04/01/2021 CLINICAL DATA:  Head trauma, moderate/severe. Additional provided: Patient has fallen multiple times in the  last 24 hours, severe headache. Fall 1 month ago as well. Unsteadiness since that time. Patient's wife reports patient fell 5 times yesterday. Patient reports worsening balance, seen "bright light and double vision intermittently." Disorientation. EXAM: CT HEAD WITHOUT CONTRAST CT CERVICAL SPINE WITHOUT CONTRAST TECHNIQUE: Multidetector CT imaging of the head and cervical spine was performed following the standard protocol without intravenous contrast. Multiplanar CT image reconstructions of the cervical spine were also generated. COMPARISON:  Prior head CT examinations 02/20/2021 and earlier. CT of the cervical spine 02/20/2021. Brain MRI 05/09/2015 FINDINGS: CT HEAD FINDINGS Brain: Cerebral volume is normal. Redemonstrated 9 mm cystic focus within the medial left basal ganglia, unchanged in size as compared to the head CT of 02/20/2021. This may reflect a perivascular space or neural glial cyst. There is no acute intracranial hemorrhage. No demarcated cortical infarct. No extra-axial fluid collection. No evidence of an intracranial mass. No midline shift. Vascular: No hyperdense vessel.  Atherosclerotic calcifications. Skull: Normal. Negative for fracture or focal lesion. Sinuses/Orbits: Visualized orbits show no acute finding. Mild bilateral ethmoid sinus mucosal thickening. Partially imaged right maxillary sinus mucous retention cyst. Additionally, mild mucosal thickening is present within the right maxillary sinus at the imaged levels. CT CERVICAL SPINE FINDINGS Alignment: Mild cervical levocurvature. Straightening of the expected cervical lordosis. No signal spondylolisthesis. Skull base and vertebrae: The basion-dental and atlanto-dental intervals are maintained.No evidence of acute fracture to the cervical spine. Soft tissues and spinal canal: No prevertebral fluid or swelling. No visible canal hematoma. Disc levels: Redemonstrated sequela of prior C5-C6 and C6-C7 disc replacement. Cervical spondylosis with  multilevel disc space narrowing and shallow multilevel disc bulges. Disc space narrowing is greatest at C4-C5 (mild-to-moderate). No appreciable high-grade spinal canal stenosis. No compressive bony neural foraminal narrowing. Upper chest: No consolidation within the imaged lung apices. No visible pneumothorax. IMPRESSION: CT head: 1. No evidence of acute intracranial abnormality. 2. Redemonstrated 9 mm cystic focus within the medial left basal ganglia. In correlating with the findings on the prior brain MRI of 05/09/2015, this may reflect a prominent perivascular space or neural glial cyst. 3. Paranasal sinus disease at the imaged levels, as described. CT cervical spine: 1. No evidence of acute fracture to the cervical spine. 2. Straightening of the expected cervical lordosis. 3. Mild cervical levocurvature. 4. Redemonstrated sequela of prior C5-C6 and C6-C7 disc replacement. 5. Cervical spondylosis, as described. Electronically Signed   By: Kellie Simmering DO   On: 04/01/2021 16:36    ____________________________________________   PROCEDURES  Procedure(s) performed (including Critical Care):  Procedures   ____________________________________________   INITIAL IMPRESSION / ASSESSMENT AND PLAN / ED COURSE  Patient had a history of syncope with a cut to his face early last month.  In the last 24 hours she is fallen at least 5 times.  Came in hypotensive.  He is not having any abdominal pain at present he is not vomiting.  His neurological exam while somewhat slow to respond is nonfocal.  Blood pressures come  up with 1 L fluids.  His creatinine is markedly elevated compared to prior lab studies but his BUN is not.  He does not have a white count or fever.  We think we will watch him carefully.  Patient reports he has had headaches associated with his falls.  This headache came on gradually.  It is not the most severe his life.  He reports he had cluster headaches when he was in [employer] years  ago. I am uncertain what is causing his falls and hypotension and his elevated creatinine with drop in GFR by at least 50%.  We will get him in the hospital give him some more fluids and keep an eye close lay on him.  Regular narcotic overdose should not cause AKI although it could cause the other problems his lab work does not look particularly dehydrated.  There appears to be no sign of infection no fever no white count no focal findings on exam.  No reason to suspect subarachnoid with gradual onset of a bad headache but not anywhere near the worst of his life.             ____________________________________________   FINAL CLINICAL IMPRESSION(S) / ED DIAGNOSES  Final diagnoses:  Fall, initial encounter  Hypotension, unspecified hypotension type  AKI (acute kidney injury) [facility])     ED Discharge Orders     None        Note:  This document was prepared using Dragon voice recognition software and may include unintentional dictation errors.    Nena Polio, MD 04/01/21 [phone]

## 2021-04-01 NOTE — H&P (Addendum)
History and Physical   COTTON BECKLEY IFO:[id number] DOB: June 10, 1961 DOA: 04/01/2021  PCP: Janith Lima, MD  Outpatient Specialists: Dr. Fletcher Anon, Cardiology Patient coming from: home   I have personally briefly reviewed patient's old medical records in [facility].  Chief Concern: Confusion  HPI: Hector [name] is a 60 y.o. male with medical history significant for bipolar, anxiety, depression, degenerative disc disease, GERD, gout, bunionectomy, history of concussion, hyperlipidemia, hypertension, history of pancreatitis, osteoarthritis, history of vasectomy,obesity, chronic back pain, transthoracic aortic aneurysm, presents emergency department for chief concerns of confusion.  He was unstable on his feet, and reports that while walking to kitchen, he felt unstable and fell on his bottom. He endorses dizziness.  He states that at baseline he is prescribed 4 Percocets per day however today he only took 1.  He endorses left foot pain.   He reports history of concussion approximately 3 weeks ago, after falling and hitting his head a collector's (glass) cabinet, he got three stitches in the ED and was sent home. He reports this has never happened.   Social history: He lives with his wife at home. He denies tobacco, etoh, and recreational drug use. He is disabled, and formerly worked for [employer] as a Administrator.   Vaccination history: He is vaccinated for covid 19, two doses of Pfizer.   ROS: Constitutional: no weight change, no fever ENT/Mouth: no sore throat, no rhinorrhea Eyes: no eye pain, no vision changes Cardiovascular: no chest pain, no dyspnea,  no edema, no palpitations Respiratory: no cough, no sputum, no wheezing Gastrointestinal: no nausea, no vomiting, no diarrhea, no constipation Genitourinary: no urinary incontinence, no dysuria, no hematuria Musculoskeletal: no arthralgias, no myalgias Skin: no skin lesions, no pruritus, Neuro: + weakness, no loss of consciousness, no  syncope Psych: no anxiety, no depression, + decrease appetite Heme/Lymph: no bruising, no bleeding  ED Course: Discussed with ED provider, patient requiring hospitalization for confusion and hypotension.  Vitals in the emergency department was remarkable for temperature of 98.4, respiration rate of 14, blood pressure 101/59, SPO2 100% on room air, heart rate of 85.  Labs was remarkable for BUN 13, serum creatinine of 2.14, nonfasting blood glucose 178, sodium 138, potassium 3.7, AST 42, lipase of 61, WBC of 4, hemoglobin 11.4, platelets 101.  BNP is 61.4.  Troponin is 8.  Lactic acid 2.3.  ED provider ordered normal saline 1 L bolus, morphine 2 mg IV, ondansetron 4 mg IV.  Assessment/Plan  Principal Problem:   Altered mental status Active Problems:   B12 deficiency   Thrombocytopenia (HCC)   GERD (gastroesophageal reflux disease)   OSA (obstructive sleep apnea)   Sleep apnea, primary central   AKI (acute kidney injury) ([place])   Hypertension   Ascending aortic aneurysm (HCC)   Anemia due to acquired thiamine deficiency   Hypotension   # Altered mental status I suspect this is multifactorial including hypomagnesemia, recent concussion, in setting of opioid use resulting in possible hypotension  - I will check ethanol, UDS, salicylate, ammonia ordered - I will check TSH, B12, magnesium, B1-replace as needed - Check urine analysis - Check MRI of the brain  # Hypomagnesemia-replace, magnesium in the a.m.  # Hypotension # Acute kidney injury-no prior CKD - Status post normal saline 2 L bolus - LR 125 mL/h, 16 hours ordered - BMP in the a.m.  # Thrombocytopenia-at baseline - Platelet range in the last 10 months is 85-129 - Outpatient follow-up  # Elevated LFT-improved  from 1 month ago - AST is 42  # Increased lactic acid-I suspect this is secondary to acute kidney injury - Low clinical suspicion for infectious etiology at this time - Advised nurse to collect a UA -  Repeat lactic acid was 1.6  # Hypertension-hypotensive on presentation - Holding home losartan-hydrochlorothiazide 50-12.5 mg at this time - Hydralazine 25 mg p.o. every 8 hours as needed for SBP greater than 170, 3 doses ordered  # Chronic back pain-resumed home pregabalin 150 mg twice daily - Holding home Percocet 10 mg at this time - Ordered morphine 2 mg IV every 4 hours as needed for severe pain  # Depression/anxiety-resumed home Lexapro 20 mg daily, Valium 5 mg every 6 hours as needed for anxiety  # PTSD-resumed home lamotrigine 150 mg p.o. twice daily  # Ectatic ascending aortic measuring 4 cm-continue outpatient monitoring and follow-up  # Obstructive sleep apnea-CPAP nightly ordered  # Gout-in remission, allopurinol resumed # Insomnia-trazodone 300 mg p.o. nightly # GERD-PPI  # DVT prophylaxis-I did not order pharmacologic DVT prophylaxis due to thrombocytopenia of 100  Chart reviewed.   DVT prophylaxis: SCDs Code Status: Full code Diet: Heart healthy Family Communication: Updated wife at bedside Disposition Plan: Pending clinical course Consults called: None at this time Admission status: MedSurg, observation, 24 hours telemetry ordered  Past Medical History:  Diagnosis Date   Abnormal LFTs    improved after gastic bypass   ADJ DISORDER WITH MIXED ANXIETY & DEPRESSED MOOD 08/23/2009   Qualifier: Diagnosis of  By: [name] MD, Standley Brooking Now on lamictal and lexapro and elevil for sleep.    Anxiety    Ascending aortic aneurysm ([place])    a. 06/2018 CT: 4.1cm.   B12 nutritional deficiency    Bipolar disorder ([place])    Chest pain    a. 2007 MV: No ischemia; b. 09/2018 recurrent c/p in setting of PE; c. 09/2018 MV: No ischemia/infarct. EF 55-65%.   Closed head injury    laceration  9 12  no loc  vision change   DDD (degenerative disc disease)    Depression    Diabetes mellitus    resolved with gastric bypass   GERD (gastroesophageal reflux disease)    no meds   Gout     Headache    once per week uses Indomethicin hx of cluster headaches    History of bunionectomy of left great toe    History of concussion    History of echocardiogram    a. 09/2018 Echo: Ef 65-70%, no rwma, mild MR. Ao root 97mm (mildly dil).   History of kidney stones    History of renal stone    Seen in emergency room not urologist   Hyperlipidemia    Hypertension 02/27/2017   Neuromuscular disorder ([place])    Nonspecific pain in the lumbar region    OBESITY 12/10/2007   a. s/p gastric bypass.   OSA (obstructive sleep apnea) 12/12/2013   resolved by weight loss surgery 10/26/2009   Osteoarthritis of knee    Pain in left knee    Pancreatitis 03/21/2016   went to [place]   Perforated ulcer [facility])    Surgical repair9/24 2011   Peripheral neuropathy    PONV (postoperative nausea and vomiting)    Pulmonary embolism ([place])    a. 09/18/2018 CTA Chest: RLL segmental arterial branch small volume PE-->Eliquis.   Past Surgical History:  Procedure Laterality Date   ABDOMINAL EXPOSURE N/A 03/29/2020   Procedure: ABDOMINAL EXPOSURE;  Surgeon: Rosetta Posner, MD;  Location: [facility] OR;  Service: Vascular;  Laterality: N/A;   ANTERIOR LUMBAR FUSION N/A 03/29/2020   Procedure: ANTERIOR LUMBAR FUSION (ALIF) L4-5;  Surgeon: Melina Schools, MD;  Location: [place];  Service: Orthopedics;  Laterality: N/A;  5.5 hrs Dr. Donnetta Hutching do approach Lt tap block with exparel   BACK SURGERY  2012   Dr. Dorina Hoyer disk   CARPAL TUNNEL RELEASE  2008   left-ulnar nerve decomp   carpel tunnel Left    CHOLECYSTECTOMY N/A 03/21/2016   Procedure: LAPAROSCOPIC CHOLECYSTECTOMY WITH INTRAOPERATIVE CHOLANGIOGRAM;  Surgeon: Ralene Ok, MD;  Location: [facility] ORS;  Service: General;  Laterality: N/A;   colonscopy      DEBRIDEMENT AND CLOSURE WOUND N/A 05/09/2020   Procedure: Irrigation and debridement of superficial wound closure;  Surgeon: Melina Schools, MD;  Location: [place];  Service: Orthopedics;  Laterality: N/A;  1 hr    DIAGNOSTIC LAPAROSCOPY     laparoscopic gastric bypass   EPIDURAL BLOCK INJECTION     by Dr. Nelva Bush   FOOT SURGERY     bilat for removal of extra bone    GASTRIC BYPASS  11/06/09   beginning weight 267   HARDWARE REMOVAL N/A 05/02/2016   Procedure: Removal of hardware Lumbar five-Sacral one with Metrex;  Surgeon: Kristeen Miss, MD;  Location: [facility];  Service: Neurosurgery;  Laterality: N/A;  Removal of hardware L5-S1 with Metrex   HERNIA REPAIR  [id number]   umbilical   HIATAL HERNIA REPAIR N/A 01/20/2013   Procedure: LAPAROSCOPIC REPAIR OF INTERNAL HERNIA;  Surgeon: Shann Medal, MD;  Location: [facility];  Service: General;  Laterality: N/A;   LUMBAR LAMINECTOMY/DECOMPRESSION MICRODISCECTOMY Left 06/07/2014   Procedure: MICRO LUMBAR DECOMPRESSION L5 - S1 ON THE LEFT/L5 FORAMINOTOMY/EXCISION SYNOVIAL CYST  1 LEVEL;  Surgeon: [name], MD;  Location: [facility];  Service: Orthopedics;  Laterality: Left;   MASS EXCISION Right 04/18/2013   Procedure: RIGHT SMALL FINGER SKIN LESION EXCISION;  Surgeon: Jolyn Nap, MD;  Location: [place];  Service: Orthopedics;  Laterality: Right;   NASAL SEPTOPLASTY W/ TURBINOPLASTY     x 2   NEPHROLITHOTOMY Right 11/03/2014   Procedure: RIGHT PERCUTANEOUS NEPHROLITHOTOMY;  Surgeon: Claybon Jabs, MD;  Location: [facility];  Service: Urology;  Laterality: Right;   SHOULDER ARTHROSCOPY  2014   rt   surgerical repair of stomach ulcer  06/08/10   per   TONSILLECTOMY     UVULOPALATOPHARYNGOPLASTY  2007   with tonsils   VASECTOMY     Social History:  reports that he has never smoked. He has never used smokeless tobacco. He reports current alcohol use. He reports that he does not use drugs.  Allergies  Allergen Reactions   Aspirin Other (See Comments)    perforated ulcer   Cymbalta [Duloxetine Hcl] Other (See Comments)    Over-sedating; slept over 24 hours with one dose.  Possible serotonin syndrome.   Nsaids Other (See Comments)     Perforated ulcer   Tape Rash    Surgical tape   Hydrocodone Other (See Comments)    headache   Vancomycin Rash    Red man syndrome.  Please premedicate.   Family History  Problem Relation Age of Onset   Uterine cancer Mother    Hypertension Mother    Cervical cancer Mother    Heart disease Father    COPD Father    Seizures Father    Arthritis Father  Arthritis Sister    Diabetes Sister    Colon cancer Maternal Uncle    Colon cancer Paternal Aunt    Diabetes Paternal Aunt    Heart disease Maternal Grandmother    Heart disease Maternal Grandfather    Heart disease Paternal Grandmother    Heart disease Paternal Grandfather    Diabetes Paternal Uncle    Family history: Family history reviewed and not pertinent  Prior to Admission medications   Medication Sig Start Date End Date Taking? Authorizing Provider  albuterol (PROVENTIL HFA;VENTOLIN HFA) 108 (90 Base) MCG/ACT inhaler Inhale 2 puffs into the lungs every 6 (six) hours as needed for wheezing or shortness of breath. 09/03/18   Caren Macadam, MD  allopurinol (ZYLOPRIM) 100 MG tablet Take 1 tablet (100 mg total) by mouth daily. 02/21/21   Janith Lima, MD  celecoxib (CELEBREX) 200 MG capsule Take 200 mg by mouth daily. 07/19/20   [provider]  colchicine 0.6 MG tablet TAKE ONE TABLET BY MOUTH DAILY AS NEEDED FOR GOUT 08/07/20   Felipa Furnace, DPM  diazepam (VALIUM) 5 MG tablet Take 5 mg by mouth every 6 (six) hours as needed for anxiety.    [provider]  escitalopram (LEXAPRO) 20 MG tablet Take 20 mg by mouth every morning.     [provider]  folic acid (FOLVITE) 1 MG tablet Take 1 tablet (1 mg total) by mouth daily. 11/26/20   Janith Lima, MD  HYDROmorphone (DILAUDID) 8 MG tablet Take 8 mg by mouth 4 (four) times daily as needed for severe pain.    [provider]  lamoTRIgine (LAMICTAL) 100 MG tablet Take 150 mg by mouth 2 (two) times daily.     [provider]   losartan-hydrochlorothiazide (HYZAAR) 50-12.5 MG tablet Take 1 tablet by mouth daily. 03/17/19   [provider]  meloxicam (MOBIC) 15 MG tablet TAKE ONE TABLET BY MOUTH DAILY 11/07/20   Felipa Furnace, DPM  nitroGLYCERIN (NITROSTAT) 0.3 MG SL tablet Place 1 tablet (0.3 mg total) under the tongue every 5 (five) minutes as needed for chest pain. 09/27/18 03/13/20  Epifanio Lesches, MD  omeprazole (PRILOSEC) 40 MG capsule Take 40 mg by mouth daily. 08/03/20   [provider]  oxyCODONE-acetaminophen (PERCOCET) 10-325 MG tablet Take 1 tablet by mouth 2 (two) times daily as needed for pain. Takes 1 tablet in the morning and 1 tablet at bedtime    [provider]  pregabalin (LYRICA) 75 MG capsule Take 75 mg by mouth daily.     [provider]  Spacer/Aero-Holding Chambers (E-Z SPACER) inhaler Use as instructed 09/03/18   Caren Macadam, MD  tamsulosin (FLOMAX) 0.4 MG CAPS capsule Take 0.4 mg by mouth daily as needed (kidney stones).     [provider]  thiamine (VITAMIN B-1) 50 MG tablet Take 1 tablet (50 mg total) by mouth daily. 12/02/20   Janith Lima, MD  traZODone (DESYREL) 100 MG tablet Take 300 mg by mouth at bedtime. 07/17/19   [provider]  zinc gluconate 50 MG tablet Take 1 tablet (50 mg total) by mouth daily. 12/02/20   Janith Lima, MD  enoxaparin (LOVENOX) 40 MG/0.4ML injection Inject 0.4 mLs (40 mg total) into the skin daily. 10 day supply 1 injection per day Patient not taking: No sig reported 03/29/20 10/17/20  Melina Schools, MD   Physical Exam: Vitals:   04/01/21 1540 04/01/21 1545 04/01/21 1615 04/01/21 1630  BP: 111/80 [name])  101/59 103/63 104/65  Pulse: 77 75 76 68  Resp: 13 14 16 13   Temp:      TempSrc:      SpO2: 100% 100% 100% 100%  Weight:      Height:       Constitutional: appears older than chronological age, NAD, calm, comfortable Eyes: PERRL, lids and conjunctivae normal ENMT: Mucous membranes are  moist. Posterior pharynx clear of any exudate or lesions. Age-appropriate dentition. Hearing appropriate Neck: normal, supple, no masses, no thyromegaly Respiratory: clear to auscultation bilaterally, no wheezing, no crackles. Normal respiratory effort. No accessory muscle use.  Cardiovascular: Regular rate and rhythm, no murmurs / rubs / gallops. No extremity edema. 2+ pedal pulses. No carotid bruits.  Abdomen: no tenderness, no masses palpated, no hepatosplenomegaly. Bowel sounds positive.  Musculoskeletal: no clubbing / cyanosis. No joint deformity upper and lower extremities. Good ROM, no contractures, no atrophy. Normal muscle tone.  Skin: no rashes, lesions, ulcers. No induration Neurologic: Sensation intact. Strength 5/5 in all 4.  Psychiatric: Normal judgment and insight. Alert and oriented x 3. Normal mood.   EKG: independently reviewed, showing sinus rhythm with rate of 85, QTc 465  Chest x-ray on Admission: I personally reviewed and I agree with radiologist reading as below.  DG Chest 2 View  Result Date: 04/01/2021 CLINICAL DATA:  Hypotension, migraine EXAM: CHEST - 2 VIEW COMPARISON:  10/17/2020 FINDINGS: Frontal and lateral views of the chest demonstrate an unremarkable cardiac silhouette. No acute airspace disease, effusion, or pneumothorax. There are no acute bony abnormalities. IMPRESSION: 1. No acute intrathoracic process. Electronically Signed   By: Randa Ngo M.D.   On: 04/01/2021 16:32   CT Head Wo Contrast  Result Date: 04/01/2021 CLINICAL DATA:  Head trauma, moderate/severe. Additional provided: Patient has fallen multiple times in the last 24 hours, severe headache. Fall 1 month ago as well. Unsteadiness since that time. Patient's wife reports patient fell 5 times yesterday. Patient reports worsening balance, seen "bright light and double vision intermittently." Disorientation. EXAM: CT HEAD WITHOUT CONTRAST CT CERVICAL SPINE WITHOUT CONTRAST TECHNIQUE: Multidetector  CT imaging of the head and cervical spine was performed following the standard protocol without intravenous contrast. Multiplanar CT image reconstructions of the cervical spine were also generated. COMPARISON:  Prior head CT examinations 02/20/2021 and earlier. CT of the cervical spine 02/20/2021. Brain MRI 05/09/2015 FINDINGS: CT HEAD FINDINGS Brain: Cerebral volume is normal. Redemonstrated 9 mm cystic focus within the medial left basal ganglia, unchanged in size as compared to the head CT of 02/20/2021. This may reflect a perivascular space or neural glial cyst. There is no acute intracranial hemorrhage. No demarcated cortical infarct. No extra-axial fluid collection. No evidence of an intracranial mass. No midline shift. Vascular: No hyperdense vessel.  Atherosclerotic calcifications. Skull: Normal. Negative for fracture or focal lesion. Sinuses/Orbits: Visualized orbits show no acute finding. Mild bilateral ethmoid sinus mucosal thickening. Partially imaged right maxillary sinus mucous retention cyst. Additionally, mild mucosal thickening is present within the right maxillary sinus at the imaged levels. CT CERVICAL SPINE FINDINGS Alignment: Mild cervical levocurvature. Straightening of the expected cervical lordosis. No signal spondylolisthesis. Skull base and vertebrae: The basion-dental and atlanto-dental intervals are maintained.No evidence of acute fracture to the cervical spine. Soft tissues and spinal canal: No prevertebral fluid or swelling. No visible canal hematoma. Disc levels: Redemonstrated sequela of prior C5-C6 and C6-C7 disc replacement. Cervical spondylosis with multilevel disc space narrowing and shallow multilevel disc bulges. Disc space narrowing is greatest at C4-C5 (mild-to-moderate).  No appreciable high-grade spinal canal stenosis. No compressive bony neural foraminal narrowing. Upper chest: No consolidation within the imaged lung apices. No visible pneumothorax. IMPRESSION: CT head: 1. No  evidence of acute intracranial abnormality. 2. Redemonstrated 9 mm cystic focus within the medial left basal ganglia. In correlating with the findings on the prior brain MRI of 05/09/2015, this may reflect a prominent perivascular space or neural glial cyst. 3. Paranasal sinus disease at the imaged levels, as described. CT cervical spine: 1. No evidence of acute fracture to the cervical spine. 2. Straightening of the expected cervical lordosis. 3. Mild cervical levocurvature. 4. Redemonstrated sequela of prior C5-C6 and C6-C7 disc replacement. 5. Cervical spondylosis, as described. Electronically Signed   By: Kellie Simmering DO   On: 04/01/2021 16:36   CT Cervical Spine Wo Contrast  Result Date: 04/01/2021 CLINICAL DATA:  Head trauma, moderate/severe. Additional provided: Patient has fallen multiple times in the last 24 hours, severe headache. Fall 1 month ago as well. Unsteadiness since that time. Patient's wife reports patient fell 5 times yesterday. Patient reports worsening balance, seen "bright light and double vision intermittently." Disorientation. EXAM: CT HEAD WITHOUT CONTRAST CT CERVICAL SPINE WITHOUT CONTRAST TECHNIQUE: Multidetector CT imaging of the head and cervical spine was performed following the standard protocol without intravenous contrast. Multiplanar CT image reconstructions of the cervical spine were also generated. COMPARISON:  Prior head CT examinations 02/20/2021 and earlier. CT of the cervical spine 02/20/2021. Brain MRI 05/09/2015 FINDINGS: CT HEAD FINDINGS Brain: Cerebral volume is normal. Redemonstrated 9 mm cystic focus within the medial left basal ganglia, unchanged in size as compared to the head CT of 02/20/2021. This may reflect a perivascular space or neural glial cyst. There is no acute intracranial hemorrhage. No demarcated cortical infarct. No extra-axial fluid collection. No evidence of an intracranial mass. No midline shift. Vascular: No hyperdense vessel.  Atherosclerotic  calcifications. Skull: Normal. Negative for fracture or focal lesion. Sinuses/Orbits: Visualized orbits show no acute finding. Mild bilateral ethmoid sinus mucosal thickening. Partially imaged right maxillary sinus mucous retention cyst. Additionally, mild mucosal thickening is present within the right maxillary sinus at the imaged levels. CT CERVICAL SPINE FINDINGS Alignment: Mild cervical levocurvature. Straightening of the expected cervical lordosis. No signal spondylolisthesis. Skull base and vertebrae: The basion-dental and atlanto-dental intervals are maintained.No evidence of acute fracture to the cervical spine. Soft tissues and spinal canal: No prevertebral fluid or swelling. No visible canal hematoma. Disc levels: Redemonstrated sequela of prior C5-C6 and C6-C7 disc replacement. Cervical spondylosis with multilevel disc space narrowing and shallow multilevel disc bulges. Disc space narrowing is greatest at C4-C5 (mild-to-moderate). No appreciable high-grade spinal canal stenosis. No compressive bony neural foraminal narrowing. Upper chest: No consolidation within the imaged lung apices. No visible pneumothorax. IMPRESSION: CT head: 1. No evidence of acute intracranial abnormality. 2. Redemonstrated 9 mm cystic focus within the medial left basal ganglia. In correlating with the findings on the prior brain MRI of 05/09/2015, this may reflect a prominent perivascular space or neural glial cyst. 3. Paranasal sinus disease at the imaged levels, as described. CT cervical spine: 1. No evidence of acute fracture to the cervical spine. 2. Straightening of the expected cervical lordosis. 3. Mild cervical levocurvature. 4. Redemonstrated sequela of prior C5-C6 and C6-C7 disc replacement. 5. Cervical spondylosis, as described. Electronically Signed   By: Kellie Simmering DO   On: 04/01/2021 16:36    Labs on Admission: I have personally reviewed following labs  CBC: Recent Labs  Lab 04/01/21  1506 04/01/21 1553   WBC 4.0 3.9*  NEUTROABS  --  2.1  HGB 11.4* 11.3*  HCT 35.6* 35.4*  MCV 98.1 97.8  PLT 100* 597*   Basic Metabolic Panel: Recent Labs  Lab 04/01/21 1506  NA 138  K 3.7  CL 106  CO2 23  GLUCOSE 178*  BUN 13  CREATININE 2.14*  CALCIUM 8.4*   GFR: Estimated Creatinine Clearance: 38.2 mL/min (A) (by C-G formula based on SCr of 2.14 mg/dL (H)).  Liver Function Tests: Recent Labs  Lab 04/01/21 1530  AST 42*  ALT 15  ALKPHOS 79  BILITOT 0.6  PROT 6.5  ALBUMIN 3.6   Recent Labs  Lab 04/01/21 1530  LIPASE 61*   Urine analysis:    Component Value Date/Time   COLORURINE YELLOW (A) 07/30/2019 1445   APPEARANCEUR CLEAR (A) 07/30/2019 1445   APPEARANCEUR Clear 10/29/2017 0837   LABSPEC 1.025 07/30/2019 1445   PHURINE 6.0 07/30/2019 1445   GLUCOSEU NEGATIVE 07/30/2019 1445   HGBUR NEGATIVE 07/30/2019 [place] 07/30/2019 1445   BILIRUBINUR Negative 10/29/2017 0837   KETONESUR 5 (A) 07/30/2019 1445   PROTEINUR NEGATIVE 07/30/2019 1445   UROBILINOGEN 1.0 05/09/2015 1844   NITRITE NEGATIVE 07/30/2019 1445   LEUKOCYTESUR NEGATIVE 07/30/2019 1445   Dr. Tobie Poet [facility]  If 7PM-7AM, please contact overnight-coverage provider If 7AM-7PM, please contact day coverage provider [email or web address]  04/01/2021, 5:23 PM

## 2021-04-02 ENCOUNTER — Encounter: Payer: Self-pay | Admitting: Internal Medicine

## 2021-04-02 ENCOUNTER — Ambulatory Visit: Payer: Federal, State, Local not specified - PPO | Admitting: Emergency Medicine

## 2021-04-02 ENCOUNTER — Observation Stay
Admit: 2021-04-02 | Discharge: 2021-04-02 | Disposition: A | Discharge status: Home / Self Care | Payer: Federal, State, Local not specified - PPO | Attending: Internal Medicine | Admitting: Internal Medicine

## 2021-04-02 DIAGNOSIS — R55 Syncope and collapse: Secondary | ICD-10-CM | POA: Diagnosis not present

## 2021-04-02 DIAGNOSIS — I10 Essential (primary) hypertension: Secondary | ICD-10-CM | POA: Diagnosis not present

## 2021-04-02 DIAGNOSIS — K219 Gastro-esophageal reflux disease without esophagitis: Secondary | ICD-10-CM | POA: Diagnosis not present

## 2021-04-02 DIAGNOSIS — R4182 Altered mental status, unspecified: Secondary | ICD-10-CM | POA: Diagnosis not present

## 2021-04-02 DIAGNOSIS — N179 Acute kidney failure, unspecified: Secondary | ICD-10-CM | POA: Diagnosis not present

## 2021-04-02 DIAGNOSIS — I9589 Other hypotension: Secondary | ICD-10-CM | POA: Diagnosis not present

## 2021-04-02 LAB — MAGNESIUM: Magnesium: 1.8 mg/dL (ref 1.7–2.4)

## 2021-04-02 LAB — BASIC METABOLIC PANEL
Anion gap: 6 (ref 5–15)
BUN: 12 mg/dL (ref 6–20)
CO2: 25 mmol/L (ref 22–32)
Calcium: 8.2 mg/dL — ABNORMAL LOW (ref 8.9–10.3)
Chloride: 111 mmol/L (ref 98–111)
Creatinine, Ser: 1.4 mg/dL — ABNORMAL HIGH (ref 0.61–1.24)
GFR, Estimated: 58 mL/min — ABNORMAL LOW (ref >60)
Glucose, Bld: 101 mg/dL — ABNORMAL HIGH (ref 70–99)
Potassium: 3.8 mmol/L (ref 3.5–5.1)
Sodium: 142 mmol/L (ref 135–145)

## 2021-04-02 LAB — CBC
HCT: 31.7 % — ABNORMAL LOW (ref 39.0–52.0)
Hemoglobin: 10.2 g/dL — ABNORMAL LOW (ref 13.0–17.0)
MCH: 31.4 pg (ref 26.0–34.0)
MCHC: 32.2 g/dL (ref 30.0–36.0)
MCV: 97.5 fL (ref 80.0–100.0)
Platelets: 62 10*3/uL — ABNORMAL LOW (ref 150–400)
RBC: 3.25 MIL/uL — ABNORMAL LOW (ref 4.22–5.81)
RDW: 14.5 % (ref 11.5–15.5)
WBC: 2.7 10*3/uL — ABNORMAL LOW (ref 4.0–10.5)
nRBC: 0 % (ref 0.0–0.2)

## 2021-04-02 LAB — ECHOCARDIOGRAM COMPLETE
AR max vel: 2.36 cm2
AV Area VTI: 2.54 cm2
AV Area mean vel: 2.51 cm2
AV Mean grad: 4 mmHg
AV Peak grad: 7.3 mmHg
Ao pk vel: 1.35 m/s
Area-P 1/2: 3.68 cm2
Height: 70 in
S' Lateral: 2.4 cm
Weight: 2811.31 oz

## 2021-04-02 LAB — D-DIMER, QUANTITATIVE: D-Dimer, Quant: 1.43 ug/mL-FEU — ABNORMAL HIGH (ref 0.00–0.50)

## 2021-04-02 MED ORDER — OXYCODONE HCL 5 MG PO TABS
5.0000 mg | ORAL_TABLET | Freq: Two times a day (BID) | ORAL | Status: DC | PRN
Start: 2021-04-02 — End: 2021-04-03
  Administered 2021-04-03: 5 mg via ORAL
  Filled 2021-04-02: qty 1

## 2021-04-02 MED ORDER — LACTATED RINGERS IV SOLN: INTRAVENOUS | Status: AC

## 2021-04-02 MED ORDER — OXYCODONE-ACETAMINOPHEN 10-325 MG PO TABS: 1.0000 | ORAL_TABLET | Freq: Two times a day (BID) | ORAL | Status: DC | PRN

## 2021-04-02 MED ORDER — LACTATED RINGERS IV BOLUS: 500.0000 mL | Freq: Once | INTRAVENOUS | Status: DC

## 2021-04-02 MED ORDER — OXYCODONE-ACETAMINOPHEN 5-325 MG PO TABS
1.0000 | ORAL_TABLET | Freq: Two times a day (BID) | ORAL | Status: DC | PRN
  Administered 2021-04-02: 1 via ORAL
  Filled 2021-04-02 (×2): qty 1

## 2021-04-02 NOTE — Plan of Care (Signed)

## 2021-04-02 NOTE — Progress Notes (Signed)
PROGRESS NOTE    [name]  EAV:[id number] DOB: 13-Apr-1961 DOA: 04/01/2021 PCP: Janith Lima, MD    Chief Complaint  Patient presents with   Fall    Brief Narrative:  Hector [name] is a 60 y.o. male with medical history significant for bipolar, anxiety, depression, degenerative disc disease, GERD, gout, bunionectomy, history of concussion, hyperlipidemia, hypertension, history of pancreatitis, osteoarthritis, history of vasectomy,obesity, chronic back pain, transthoracic aortic aneurysm, presents emergency department for chief concerns of confusion.  Assessment & Plan:   Principal Problem:   Altered mental status Active Problems:   B12 deficiency   Thrombocytopenia (HCC)   GERD (gastroesophageal reflux disease)   OSA (obstructive sleep apnea)   Sleep apnea, primary central   AKI (acute kidney injury) ([place])   Hypertension   Ascending aortic aneurysm (HCC)   Anemia due to acquired thiamine deficiency   Hypotension   Multiple falls and syncope:  Pt reports his legs gave out yesterday , also reports passing out with concussion few weeks ago.  Troponin is negative.  EKG unremarkable.  Echocardiogram ordered.  Therapy eval ordered.     Acute metabolic encephalopathy  probably from pain meds vs hypotension vs dehydration.  Resolved and pt appears to be back to baseline.  Ammonia level wnl, tSH wnl, vit b12 elevated.  CT head and MRI brain does not show any acute stroke.  MRI Brain showed Increased size of cystic structure at the inferior left basal ganglia, likely a neuroglial cyst or enlarged perivascular space. Unclear if contributed the AMS and recurrent falls.       Pancytopenia:  ? Medication induced.  Recommend outpatient follow up with hematology for further work up.     AKI;  Probably secondary to dehydration.  Admitted with a creatinine of 2.1, baseline creatinine is around 1.1.  Improved to 1.4 today. Recommend to continue with IV fluids for another  24 hours.   GERD:  STABLE.    Mild elevation of lipase:  Pt denies any nausea, vomiting or abdominal pain.   Mild anemia of chronic disease:  Hemoglobin around 10.2.      DVT prophylaxis: SCD'S Code Status: (Full code) Family Communication: none at bedside.  Disposition:   Status is: Observation  The patient will require care spanning > 2 midnights and should be moved to inpatient because: Ongoing diagnostic testing needed not appropriate for outpatient work up  Dispo: The patient is from: Home              Anticipated d/c is to: Home              Patient currently is not medically stable to d/c.   Difficult to place patient No       Consultants:  Cardiology.   Procedures: Echocardiogram.   Antimicrobials: none.   Subjective: No dizziness, he feels he is back to baseline.   Objective: Vitals:   04/02/21 0820 04/02/21 0900 04/02/21 0904 04/02/21 0908  BP: (!) 84/50 108/74 122/75 110/76  Pulse: 69 72 83 (!) 101  Resp: 20     Temp: 98.4 F (36.9 C)     TempSrc:      SpO2: 96% 99%    Weight:      Height:        Intake/Output Summary (Last 24 hours) at 04/02/2021 1012 Last data filed at 04/02/2021 0734 Gross per 24 hour  Intake 696.38 ml  Output 625 ml  Net 71.38 ml   [employer]  04/01/21 1504 04/01/21 2031  Weight: 72.6 kg 79.7 kg    Examination:  General exam: Appears calm and comfortable  Respiratory system: Clear to auscultation. Respiratory effort normal. Cardiovascular system: S1 & S2 heard, RRR. No JVD, murmurs,  No pedal edema. Gastrointestinal system: Abdomen is nondistended, soft and nontender.  Normal bowel sounds heard. Central nervous system: Alert and oriented. No focal neurological deficits. Extremities: Symmetric 5 x 5 power. Skin: No rashes, lesions or ulcers Psychiatry: Mood & affect appropriate.     Data Reviewed: I have personally reviewed following labs and imaging studies  CBC: Recent Labs  Lab 04/01/21 1506  04/01/21 1553 04/02/21 0609  WBC 4.0 3.9* 2.7*  NEUTROABS  --  2.1  --   HGB 11.4* 11.3* 10.2*  HCT 35.6* 35.4* 31.7*  MCV 98.1 97.8 97.5  PLT 100* 101* 62*    Basic Metabolic Panel: Recent Labs  Lab 04/01/21 1506 04/01/21 1720 04/02/21 0609  NA 138  --  142  K 3.7  --  3.8  CL 106  --  111  CO2 23  --  25  GLUCOSE 178*  --  101*  BUN 13  --  12  CREATININE 2.14*  --  1.40*  CALCIUM 8.4*  --  8.2*  MG  --  1.5* 1.8    GFR: Estimated Creatinine Clearance: 58.7 mL/min (A) (by C-G formula based on SCr of 1.4 mg/dL (H)).  Liver Function Tests: Recent Labs  Lab 04/01/21 1530  AST 42*  ALT 15  ALKPHOS 79  BILITOT 0.6  PROT 6.5  ALBUMIN 3.6    CBG: No results for input(s): GLUCAP in the last 168 hours.   Recent Results (from the past 240 hour(s))  Resp Panel by RT-PCR (Flu A&B, Covid)     Status: None   Collection Time: 04/01/21  5:34 PM   Specimen: Nasopharyngeal(NP) swabs in vial transport medium  Result Value Ref Range Status   SARS Coronavirus 2 by RT PCR NEGATIVE NEGATIVE Final    Comment: (NOTE) SARS-CoV-2 target nucleic acids are NOT DETECTED.  The SARS-CoV-2 RNA is generally detectable in upper respiratory specimens during the acute phase of infection. The lowest concentration of SARS-CoV-2 viral copies this assay can detect is 138 copies/mL. A negative result does not preclude SARS-Cov-2 infection and should not be used as the sole basis for treatment or other patient management decisions. A negative result may occur with  improper specimen collection/handling, submission of specimen other than nasopharyngeal swab, presence of viral mutation(s) within the areas targeted by this assay, and inadequate number of viral copies(<138 copies/mL). A negative result must be combined with clinical observations, patient history, and epidemiological information. The expected result is Negative.  Fact Sheet for Patients:   EntrepreneurPulse.com.au  Fact Sheet for Healthcare Providers:  IncredibleEmployment.be  This test is no t yet approved or cleared by the [place] FDA and  has been authorized for detection and/or diagnosis of SARS-CoV-2 by FDA under an Emergency Use Authorization (EUA). This EUA will remain  in effect (meaning this test can be used) for the duration of the COVID-19 declaration under Section 564(b)(1) of the Act, 21 U.S.C.section 360bbb-3(b)(1), unless the authorization is terminated  or revoked sooner.       Influenza A by PCR NEGATIVE NEGATIVE Final   Influenza B by PCR NEGATIVE NEGATIVE Final    Comment: (NOTE) The Xpert Xpress SARS-CoV-2/FLU/RSV plus assay is intended as an aid in the diagnosis of influenza from Nasopharyngeal swab specimens  and should not be used as a sole basis for treatment. Nasal washings and aspirates are unacceptable for Xpert Xpress SARS-CoV-2/FLU/RSV testing.  Fact Sheet for Patients: EntrepreneurPulse.com.au  Fact Sheet for Healthcare Providers: IncredibleEmployment.be  This test is not yet approved or cleared by the [place] FDA and has been authorized for detection and/or diagnosis of SARS-CoV-2 by FDA under an Emergency Use Authorization (EUA). This EUA will remain in effect (meaning this test can be used) for the duration of the COVID-19 declaration under Section 564(b)(1) of the Act, 21 U.S.C. section 360bbb-3(b)(1), unless the authorization is terminated or revoked.  Performed at [facility], [address], [place], [place] [address]          Radiology Studies: DG Chest 2 View  Result Date: 04/01/2021 CLINICAL DATA:  Hypotension, migraine EXAM: CHEST - 2 VIEW COMPARISON:  10/17/2020 FINDINGS: Frontal and lateral views of the chest demonstrate an unremarkable cardiac silhouette. No acute airspace disease, effusion, or pneumothorax. There  are no acute bony abnormalities. IMPRESSION: 1. No acute intrathoracic process. Electronically Signed   By: Randa Ngo M.D.   On: 04/01/2021 16:32   CT Head Wo Contrast  Result Date: 04/01/2021 CLINICAL DATA:  Head trauma, moderate/severe. Additional provided: Patient has fallen multiple times in the last 24 hours, severe headache. Fall 1 month ago as well. Unsteadiness since that time. Patient's wife reports patient fell 5 times yesterday. Patient reports worsening balance, seen "bright light and double vision intermittently." Disorientation. EXAM: CT HEAD WITHOUT CONTRAST CT CERVICAL SPINE WITHOUT CONTRAST TECHNIQUE: Multidetector CT imaging of the head and cervical spine was performed following the standard protocol without intravenous contrast. Multiplanar CT image reconstructions of the cervical spine were also generated. COMPARISON:  Prior head CT examinations 02/20/2021 and earlier. CT of the cervical spine 02/20/2021. Brain MRI 05/09/2015 FINDINGS: CT HEAD FINDINGS Brain: Cerebral volume is normal. Redemonstrated 9 mm cystic focus within the medial left basal ganglia, unchanged in size as compared to the head CT of 02/20/2021. This may reflect a perivascular space or neural glial cyst. There is no acute intracranial hemorrhage. No demarcated cortical infarct. No extra-axial fluid collection. No evidence of an intracranial mass. No midline shift. Vascular: No hyperdense vessel.  Atherosclerotic calcifications. Skull: Normal. Negative for fracture or focal lesion. Sinuses/Orbits: Visualized orbits show no acute finding. Mild bilateral ethmoid sinus mucosal thickening. Partially imaged right maxillary sinus mucous retention cyst. Additionally, mild mucosal thickening is present within the right maxillary sinus at the imaged levels. CT CERVICAL SPINE FINDINGS Alignment: Mild cervical levocurvature. Straightening of the expected cervical lordosis. No signal spondylolisthesis. Skull base and vertebrae: The  basion-dental and atlanto-dental intervals are maintained.No evidence of acute fracture to the cervical spine. Soft tissues and spinal canal: No prevertebral fluid or swelling. No visible canal hematoma. Disc levels: Redemonstrated sequela of prior C5-C6 and C6-C7 disc replacement. Cervical spondylosis with multilevel disc space narrowing and shallow multilevel disc bulges. Disc space narrowing is greatest at C4-C5 (mild-to-moderate). No appreciable high-grade spinal canal stenosis. No compressive bony neural foraminal narrowing. Upper chest: No consolidation within the imaged lung apices. No visible pneumothorax. IMPRESSION: CT head: 1. No evidence of acute intracranial abnormality. 2. Redemonstrated 9 mm cystic focus within the medial left basal ganglia. In correlating with the findings on the prior brain MRI of 05/09/2015, this may reflect a prominent perivascular space or neural glial cyst. 3. Paranasal sinus disease at the imaged levels, as described. CT cervical spine: 1. No evidence of acute fracture to the  cervical spine. 2. Straightening of the expected cervical lordosis. 3. Mild cervical levocurvature. 4. Redemonstrated sequela of prior C5-C6 and C6-C7 disc replacement. 5. Cervical spondylosis, as described. Electronically Signed   By: Kellie Simmering DO   On: 04/01/2021 16:36   CT Cervical Spine Wo Contrast  Result Date: 04/01/2021 CLINICAL DATA:  Head trauma, moderate/severe. Additional provided: Patient has fallen multiple times in the last 24 hours, severe headache. Fall 1 month ago as well. Unsteadiness since that time. Patient's wife reports patient fell 5 times yesterday. Patient reports worsening balance, seen "bright light and double vision intermittently." Disorientation. EXAM: CT HEAD WITHOUT CONTRAST CT CERVICAL SPINE WITHOUT CONTRAST TECHNIQUE: Multidetector CT imaging of the head and cervical spine was performed following the standard protocol without intravenous contrast. Multiplanar CT  image reconstructions of the cervical spine were also generated. COMPARISON:  Prior head CT examinations 02/20/2021 and earlier. CT of the cervical spine 02/20/2021. Brain MRI 05/09/2015 FINDINGS: CT HEAD FINDINGS Brain: Cerebral volume is normal. Redemonstrated 9 mm cystic focus within the medial left basal ganglia, unchanged in size as compared to the head CT of 02/20/2021. This may reflect a perivascular space or neural glial cyst. There is no acute intracranial hemorrhage. No demarcated cortical infarct. No extra-axial fluid collection. No evidence of an intracranial mass. No midline shift. Vascular: No hyperdense vessel.  Atherosclerotic calcifications. Skull: Normal. Negative for fracture or focal lesion. Sinuses/Orbits: Visualized orbits show no acute finding. Mild bilateral ethmoid sinus mucosal thickening. Partially imaged right maxillary sinus mucous retention cyst. Additionally, mild mucosal thickening is present within the right maxillary sinus at the imaged levels. CT CERVICAL SPINE FINDINGS Alignment: Mild cervical levocurvature. Straightening of the expected cervical lordosis. No signal spondylolisthesis. Skull base and vertebrae: The basion-dental and atlanto-dental intervals are maintained.No evidence of acute fracture to the cervical spine. Soft tissues and spinal canal: No prevertebral fluid or swelling. No visible canal hematoma. Disc levels: Redemonstrated sequela of prior C5-C6 and C6-C7 disc replacement. Cervical spondylosis with multilevel disc space narrowing and shallow multilevel disc bulges. Disc space narrowing is greatest at C4-C5 (mild-to-moderate). No appreciable high-grade spinal canal stenosis. No compressive bony neural foraminal narrowing. Upper chest: No consolidation within the imaged lung apices. No visible pneumothorax. IMPRESSION: CT head: 1. No evidence of acute intracranial abnormality. 2. Redemonstrated 9 mm cystic focus within the medial left basal ganglia. In correlating  with the findings on the prior brain MRI of 05/09/2015, this may reflect a prominent perivascular space or neural glial cyst. 3. Paranasal sinus disease at the imaged levels, as described. CT cervical spine: 1. No evidence of acute fracture to the cervical spine. 2. Straightening of the expected cervical lordosis. 3. Mild cervical levocurvature. 4. Redemonstrated sequela of prior C5-C6 and C6-C7 disc replacement. 5. Cervical spondylosis, as described. Electronically Signed   By: Kellie Simmering DO   On: 04/01/2021 16:36   MR BRAIN WO CONTRAST  Result Date: 04/01/2021 CLINICAL DATA:  Acute neurologic deficit EXAM: MRI HEAD WITHOUT CONTRAST TECHNIQUE: Multiplanar, multiecho pulse sequences of the brain and surrounding structures were obtained without intravenous contrast. COMPARISON:  05/09/2015 FINDINGS: Brain: No acute infarct, mass effect or extra-axial collection. No acute or chronic hemorrhage. Normal white matter signal, parenchymal volume and CSF spaces. Cystic structure at the inferior left basal ganglia has increased in size from 7 mm to 11 mm. Vascular: Major flow voids are preserved. Skull and upper cervical spine: Normal calvarium and skull base. Visualized upper cervical spine and soft tissues are normal. Sinuses/Orbits:No paranasal  sinus fluid levels or advanced mucosal thickening. No mastoid or middle ear effusion. Normal orbits. IMPRESSION: 1. No acute intracranial abnormality. 2. Increased size of cystic structure at the inferior left basal ganglia, likely a neuroglial cyst or enlarged perivascular space. Electronically Signed   By: Ulyses Jarred M.D.   On: 04/01/2021 22:48   DG Foot 2 Views Left  Result Date: 04/01/2021 CLINICAL DATA:  Fall lateral pain EXAM: LEFT FOOT - 2 VIEW COMPARISON:  04/22/2019 FINDINGS: Postsurgical changes of the first metatarsal. No definite acute displaced fracture or malalignment is seen. IMPRESSION: No definite acute osseous abnormality. Electronically Signed   By:  Donavan Foil M.D.   On: 04/01/2021 20:10        Scheduled Meds:  allopurinol  100 mg Oral Daily   cholecalciferol  1,000 Units Oral Daily   escitalopram  20 mg Oral q morning   folic acid  1 mg Oral Daily   lamoTRIgine  150 mg Oral BID   pantoprazole  80 mg Oral Daily   pregabalin  150 mg Oral BID   traZODone  300 mg Oral QHS   Continuous Infusions:  lactated ringers     lactated ringers       LOS: 0 days        Hosie Poisson, MD [facility]   To contact the attending provider between 7A-7P or the covering provider during after hours 7P-7A, please log into the web site [email or web address] and access using universal [place] password for that web site. If you do not have the password, please call the hospital operator.  04/02/2021, 10:12 AM

## 2021-04-02 NOTE — Progress Notes (Signed)
PT Cancellation Note  Patient Details Name: Hector [name] MRN: [id number] DOB: [birth year]   Cancelled Treatment:    Reason Eval/Treat Not Completed: Other (comment) Chart reviewed, attempted to see pt late this morning w/o success.  Repeatedly called first and last name with increasing volume and gentle leg rub, pt sleeping soundly/snoring.  May be able attempt later today as time allows, if not will attempt eval tomorrow.  Kreg Shropshire, DPT 04/02/2021, 12:38 PM

## 2021-04-02 NOTE — Progress Notes (Signed)
*  PRELIMINARY RESULTS* Echocardiogram 2D Echocardiogram has been performed.  Hector [name] 04/02/2021, 12:32 PM

## 2021-04-02 NOTE — Consult Note (Addendum)
Cardiology Consultation:   Patient ID: TLALOC TADDEI MRN: [id number]; DOB: July 24, 1961  Admit date: 04/01/2021 Date of Consult: 04/02/2021  PCP:  Janith Lima, MD   [facility] Providers Cardiologist: Dr. Janee Morn here to update MD or APP on Care Team, Refresh:1}     Patient Profile:   JERAMIAH MCCAUGHEY is a 60 y.o. male with a hx of hypertension, hyperlipidemia, diabetes, OSA, PE who is being seen 04/02/2021 for the evaluation of syncope at the request of Dr. Karleen Hampshire.  History of Present Illness:   Mr. Cosgriff is a 60 year old male with history of hypertension, hyperlipidemia, diabetes, OSA, bipolar disorder, anxiety who presents due to altered mental status.  History limited by condition of patient, he is very somnolent today, barely arousable upon sternal rub.  Most of history obtained from chart and staff.  Apparently patient felt dizzy yesterday while walking to his kitchen.  Also felt unstable on his gait, falling approximately 5 times two days ago.  Endorsed having blurry visions, and headache.  He is on narcotics chronically for back pain.  Reports states he fell about 2 weeks ago sustaining a concussion.  Prior work-up from a cardiac perspective included echocardiogram in January 2020 which showed normal EF.  Lexiscan Myoview also in January 2020 was normal.  Echocardiogram was ordered this admission which is pending.  EKG on admit showed sinus rhythm, PVC.  Troponins were normal.  Telemetry shows sinus rhythm.  MRI brain with no acute abnormality prior basal ganglia cystic structure noted.   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    Anxiety    Ascending aortic aneurysm ([place])    a. 06/2018 CT: 4.1cm.   B12 nutritional deficiency    Bipolar disorder ([place])    Chest pain     a. 2007 MV: No ischemia; b. 09/2018 recurrent c/p in setting of PE; c. 09/2018 MV: No ischemia/infarct. EF 55-65%.   Closed head injury    laceration  9 12  no loc  vision change   DDD (degenerative disc disease)    Depression    Diabetes mellitus    resolved with gastric bypass   GERD (gastroesophageal reflux disease)    no meds   Gout    Headache    once per week uses Indomethicin hx of cluster headaches    History of bunionectomy of left great toe    History of concussion    History of echocardiogram    a. 09/2018 Echo: Ef 65-70%, no rwma, mild MR. Ao root 26mm (mildly dil).   History of kidney stones    History of renal stone    Seen in emergency room not urologist   Hyperlipidemia    Hypertension 02/27/2017   Neuromuscular disorder ([place])    Nonspecific pain in the lumbar region    OBESITY 12/10/2007   a. s/p gastric bypass.   OSA (obstructive sleep apnea) 12/12/2013   resolved by weight loss surgery 10/26/2009   Osteoarthritis of knee    Pain in left knee    Pancreatitis 03/21/2016   went to    Perforated ulcer [facility])    Surgical repair9/24 2011   Peripheral neuropathy    PONV (postoperative nausea and vomiting)    Pulmonary embolism ([place])  a. 09/18/2018 CTA Chest: RLL segmental arterial branch small volume PE-->Eliquis.    Past Surgical History:  Procedure Laterality Date   ABDOMINAL EXPOSURE N/A 03/29/2020   Procedure: ABDOMINAL EXPOSURE;  Surgeon: Rosetta Posner, MD;  Location: [facility] OR;  Service: Vascular;  Laterality: N/A;   ANTERIOR LUMBAR FUSION N/A 03/29/2020   Procedure: ANTERIOR LUMBAR FUSION (ALIF) L4-5;  Surgeon: Melina Schools, MD;  Location: [facility];  Service: Orthopedics;  Laterality: N/A;  5.5 hrs Dr. Donnetta Hutching do approach Lt tap block with exparel   BACK SURGERY  2012   Dr. Dorina Hoyer disk   CARPAL TUNNEL RELEASE  2008   left-ulnar nerve decomp   carpel tunnel Left    CHOLECYSTECTOMY N/A 03/21/2016   Procedure: LAPAROSCOPIC CHOLECYSTECTOMY WITH  INTRAOPERATIVE CHOLANGIOGRAM;  Surgeon: Ralene Ok, MD;  Location: [facility] OR[facility];  Service: General;  Laterality: N/A;   colonscopy      DEBRIDEMENT AND CLOSURE WOUND N/A 05/09/2020   Procedure: Irrigation and debridement of superficial wound closure;  Surgeon: Melina Schools, MD;  Location: [place];  Service: Orthopedics;  Laterality: N/A;  1 hr   DIAGNOSTIC LAPAROSCOPY     laparoscopic gastric bypass   EPIDURAL BLOCK INJECTION     by Dr. Nelva Bush   FOOT SURGERY     bilat for removal of extra bone    GASTRIC BYPASS  11/06/09   beginning weight 267   HARDWARE REMOVAL N/A 05/02/2016   Procedure: Removal of hardware Lumbar five-Sacral one with Metrex;  Surgeon: Kristeen Miss, MD;  Location: [facility];  Service: Neurosurgery;  Laterality: N/A;  Removal of hardware L5-S1 with Metrex   HERNIA REPAIR  1540   umbilical   HIATAL HERNIA REPAIR N/A 01/20/2013   Procedure: LAPAROSCOPIC REPAIR OF INTERNAL HERNIA;  Surgeon: Shann Medal, MD;  Location: [facility];  Service: General;  Laterality: N/A;   LUMBAR LAMINECTOMY/DECOMPRESSION MICRODISCECTOMY Left 06/07/2014   Procedure: MICRO LUMBAR DECOMPRESSION L5 - S1 ON THE LEFT/L5 FORAMINOTOMY/EXCISION SYNOVIAL CYST  1 LEVEL;  Surgeon: [name], MD;  Location: [facility];  Service: Orthopedics;  Laterality: Left;   MASS EXCISION Right 04/18/2013   Procedure: RIGHT SMALL FINGER SKIN LESION EXCISION;  Surgeon: Jolyn Nap, MD;  Location: [place];  Service: Orthopedics;  Laterality: Right;   NASAL SEPTOPLASTY W/ TURBINOPLASTY     x 2   NEPHROLITHOTOMY Right 11/03/2014   Procedure: RIGHT PERCUTANEOUS NEPHROLITHOTOMY;  Surgeon: Claybon Jabs, MD;  Location: [facility];  Service: Urology;  Laterality: Right;   SHOULDER ARTHROSCOPY  2014   rt   surgerical repair of stomach ulcer  06/08/10   per   TONSILLECTOMY     UVULOPALATOPHARYNGOPLASTY  2007   with tonsils   VASECTOMY       Home Medications:  Prior to Admission medications   Medication Sig  Start Date End Date Taking? Authorizing Provider  allopurinol (ZYLOPRIM) 100 MG tablet Take 1 tablet (100 mg total) by mouth daily. 02/21/21  Yes Janith Lima, MD  cholecalciferol (VITAMIN D3) 25 MCG (1000 UNIT) tablet Take 1,000 Units by mouth daily.   Yes [provider]  escitalopram (LEXAPRO) 20 MG tablet Take 20 mg by mouth every morning.    Yes [provider]  Fish Oil-Krill Oil (KRILL & FISH OIL BLEND PO) Take 1 capsule by mouth daily.   Yes [provider]  folic acid (FOLVITE) 1 MG tablet Take 1 tablet (1 mg total) by mouth daily. 11/26/20  Yes Ronnald Ramp,  Arvid Right, MD  lamoTRIgine (LAMICTAL) 100 MG tablet Take 150 mg by mouth 2 (two) times daily.    Yes [provider]  losartan-hydrochlorothiazide (HYZAAR) 50-12.5 MG tablet Take 1 tablet by mouth daily. 03/17/19  Yes [provider]  omeprazole (PRILOSEC) 40 MG capsule Take 40 mg by mouth daily. 08/03/20  Yes [provider]  oxyCODONE-acetaminophen (PERCOCET) 10-325 MG tablet Take 1 tablet by mouth 2 (two) times daily as needed for pain. Takes 1 tablet in the morning and 1 tablet at bedtime   Yes [provider]  pregabalin (LYRICA) 150 MG capsule Take 150 mg by mouth 2 (two) times daily. 03/19/21  Yes [provider]  traZODone (DESYREL) 100 MG tablet Take 300 mg by mouth at bedtime. 07/17/19  Yes [provider]  vitamin E 180 MG (400 UNITS) capsule Take 400 Units by mouth daily.   Yes [provider]  albuterol (PROVENTIL HFA;VENTOLIN HFA) 108 (90 Base) MCG/ACT inhaler Inhale 2 puffs into the lungs every 6 (six) hours as needed for wheezing or shortness of breath. 09/03/18   Caren Macadam, MD  celecoxib (CELEBREX) 200 MG capsule Take 200 mg by mouth daily. Patient not taking: No sig reported 07/19/20   [provider]  colchicine 0.6 MG tablet TAKE ONE TABLET BY MOUTH DAILY AS NEEDED FOR GOUT 08/07/20   Felipa Furnace, DPM  diazepam (VALIUM)  5 MG tablet Take 5 mg by mouth every 6 (six) hours as needed for anxiety. Patient not taking: Reported on 04/01/2021    [provider]  HYDROmorphone (DILAUDID) 8 MG tablet Take 8 mg by mouth 4 (four) times daily as needed for severe pain. Patient not taking: Reported on 04/01/2021    [provider]  meloxicam (MOBIC) 15 MG tablet TAKE ONE TABLET BY MOUTH DAILY 11/07/20   Felipa Furnace, DPM  nitroGLYCERIN (NITROSTAT) 0.3 MG SL tablet Place 1 tablet (0.3 mg total) under the tongue every 5 (five) minutes as needed for chest pain. 09/27/18 03/13/20  Epifanio Lesches, MD  pregabalin (LYRICA) 75 MG capsule Take 75 mg by mouth daily.  Patient not taking: Reported on 04/01/2021    [provider]  Spacer/Aero-Holding Chambers (E-Z SPACER) inhaler Use as instructed 09/03/18   Caren Macadam, MD  tamsulosin (FLOMAX) 0.4 MG CAPS capsule Take 0.4 mg by mouth daily as needed (kidney stones).     [provider]  thiamine (VITAMIN B-1) 50 MG tablet Take 1 tablet (50 mg total) by mouth daily. Patient not taking: Reported on 04/01/2021 12/02/20   Janith Lima, MD  zinc gluconate 50 MG tablet Take 1 tablet (50 mg total) by mouth daily. Patient not taking: Reported on 04/01/2021 12/02/20   Janith Lima, MD  enoxaparin (LOVENOX) 40 MG/0.4ML injection Inject 0.4 mLs (40 mg total) into the skin daily. 10 day supply 1 injection per day Patient not taking: No sig reported 03/29/20 10/17/20  Melina Schools, MD    Inpatient Medications: Scheduled Meds:  allopurinol  100 mg Oral Daily   cholecalciferol  1,000 Units Oral Daily   escitalopram  20 mg Oral q morning   folic acid  1 mg Oral Daily   lamoTRIgine  150 mg Oral BID   pantoprazole  80 mg Oral Daily   pregabalin  150 mg Oral BID   traZODone  300 mg Oral QHS   Continuous Infusions:  lactated ringers 75 mL/hr at 04/02/21 1019   PRN Meds: acetaminophen **OR** acetaminophen,  albuterol, diazepam, hydrALAZINE,  morphine injection, nitroGLYCERIN, ondansetron **OR** ondansetron (ZOFRAN) IV, tamsulosin  Allergies:    Allergies  Allergen Reactions   Aspirin Other (See Comments)    perforated ulcer   Cymbalta [Duloxetine Hcl] Other (See Comments)    Over-sedating; slept over 24 hours with one dose.  Possible serotonin syndrome.   Nsaids Other (See Comments)    Perforated ulcer   Tape Rash    Surgical tape   Hydrocodone Other (See Comments)    headache   Vancomycin Rash    Red man syndrome.  Please premedicate.    Social History:   Social History   Socioeconomic History   Marital status: Married    Spouse name: Janine   Number of children: 2   Years of education: Associates   Highest education level: Not on file  Occupational History   Occupation: retired    Fish farm manager: [employer]  Tobacco Use   Smoking status: Never   Smokeless tobacco: Never  Vaping Use   Vaping Use: Never used  Substance and Sexual Activity   Alcohol use: Yes    Comment: occas liquor    Drug use: No   Sexual activity: Yes  Other Topics Concern   Not on file  Social History Narrative   Patient is married Probation officer) and lives at home with his wife.   Patient has two children.   Patient has a college education.   Regular exercise-no   Employed Event organiser and investigation   school travels       10 hours work     Sleep  No longer needs  cpap   Patient is right-handed.   Patient drinks one soda daily.   trainiing for [employer]      Social Determinants of Health   Financial Resource Strain: Not on file  Food Insecurity: Not on file  Transportation Needs: Not on file  Physical Activity: Not on file  Stress: Not on file  Social Connections: Not on file  Intimate Partner Violence: Not on file    Family History:    Family History  Problem Relation Age of Onset   Uterine cancer Mother    Hypertension Mother    Cervical cancer Mother    Heart disease Father    COPD Father    Seizures  Father    Arthritis Father    Arthritis Sister    Diabetes Sister    Colon cancer Maternal Uncle    Colon cancer Paternal Aunt    Diabetes Paternal Aunt    Heart disease Maternal Grandmother    Heart disease Maternal Grandfather    Heart disease Paternal Grandmother    Heart disease Paternal Grandfather    Diabetes Paternal Uncle      ROS:  Please see the history of present illness.   All other ROS reviewed and negative.     Physical Exam/Data:   Vitals:   04/02/21 0900 04/02/21 0904 04/02/21 0908 04/02/21 1215  BP: 108/74 122/75 110/76 107/66  Pulse: 72 83 (!) 101 84  Resp:    (!) 21  Temp:    97.7 F (36.5 C)  TempSrc:      SpO2: 99%   98%  Weight:      Height:        Intake/Output Summary (Last 24 hours) at 04/02/2021 1224 Last data filed at 04/02/2021 0734 Gross per 24 hour  Intake 696.38 ml  Output 625 ml  Net 71.38 ml   Last 3 Weights  04/01/2021 04/01/2021 02/20/2021  Weight (lbs) 175 lb 11.3 oz 160 lb 0.9 oz 160 lb  Weight (kg) 79.7 kg 72.6 kg 72.576 kg     Body mass index is 25.21 kg/m.  General:  Well nourished, well developed, very somnolent HEENT: normal Lymph: no adenopathy Neck: no JVD Endocrine:  No thryomegaly Vascular: No carotid bruits; FA pulses 2+ bilaterally without bruits  Cardiac:  normal S1, S2; RRR; no murmur  Lungs:  clear to auscultation bilaterally, no wheezing, rhonchi or rales  Abd: soft, nontender, no hepatomegaly  Ext: no edema Musculoskeletal:  No deformities, BUE and BLE strength normal and equal Skin: warm and dry  Neuro: Patient is somnolent, unable to assess Psych: Somnolent, unable to assess  EKG:  The EKG was personally reviewed and demonstrates: Normal sinus rhythm, rare PVC Telemetry:  Telemetry was personally reviewed and demonstrates: Sinus rhythm  Relevant CV Studies: Echo 09/2018 Left ventricle: The cavity size was normal. There was mild    concentric hypertrophy. Systolic function was vigorous. The    estimated  ejection fraction was in the range of 65% to 70%. Wall    motion was normal; there were no regional wall motion    abnormalities. Left ventricular diastolic function parameters    were normal.  - Aortic valve: There was mild regurgitation.  - Aorta: Aortic root dimension: 41 mm (ED).  - Aortic root: The aortic root was mildly dilated.  - Pulmonary arteries: Systolic pressure could not be accurately    estimated.   Laboratory Data:  High Sensitivity Troponin:   Recent Labs  Lab 04/01/21 1530 04/01/21 1730  TROPONINIHS 8 8     Chemistry Recent Labs  Lab 04/01/21 1506 04/02/21 0609  NA 138 142  K 3.7 3.8  CL 106 111  CO2 23 25  GLUCOSE 178* 101*  BUN 13 12  CREATININE 2.14* 1.40*  CALCIUM 8.4* 8.2*  GFRNONAA 35* 58*  ANIONGAP 9 6    Recent Labs  Lab 04/01/21 1530  PROT 6.5  ALBUMIN 3.6  AST 42*  ALT 15  ALKPHOS 79  BILITOT 0.6   Hematology Recent Labs  Lab 04/01/21 1506 04/01/21 1553 04/02/21 0609  WBC 4.0 3.9* 2.7*  RBC 3.63* 3.62* 3.25*  HGB 11.4* 11.3* 10.2*  HCT 35.6* 35.4* 31.7*  MCV 98.1 97.8 97.5  MCH 31.4 31.2 31.4  MCHC 32.0 31.9 32.2  RDW 14.5 14.6 14.5  PLT 100* 101* 62*   BNP Recent Labs  Lab 04/01/21 1530  BNP 61.4    DDimer  Recent Labs  Lab 04/02/21 1028  DDIMER 1.43*     Radiology/Studies:  DG Chest 2 View  Result Date: 04/01/2021 CLINICAL DATA:  Hypotension, migraine EXAM: CHEST - 2 VIEW COMPARISON:  10/17/2020 FINDINGS: Frontal and lateral views of the chest demonstrate an unremarkable cardiac silhouette. No acute airspace disease, effusion, or pneumothorax. There are no acute bony abnormalities. IMPRESSION: 1. No acute intrathoracic process. Electronically Signed   By: Randa Ngo M.D.   On: 04/01/2021 16:32   CT Head Wo Contrast  Result Date: 04/01/2021 CLINICAL DATA:  Head trauma, moderate/severe. Additional provided: Patient has fallen multiple times in the last 24 hours, severe headache. Fall 1 month ago as well.  Unsteadiness since that time. Patient's wife reports patient fell 5 times yesterday. Patient reports worsening balance, seen "bright light and double vision intermittently." Disorientation. EXAM: CT HEAD WITHOUT CONTRAST CT CERVICAL SPINE WITHOUT CONTRAST TECHNIQUE: Multidetector CT imaging of the head and cervical spine was  performed following the standard protocol without intravenous contrast. Multiplanar CT image reconstructions of the cervical spine were also generated. COMPARISON:  Prior head CT examinations 02/20/2021 and earlier. CT of the cervical spine 02/20/2021. Brain MRI 05/09/2015 FINDINGS: CT HEAD FINDINGS Brain: Cerebral volume is normal. Redemonstrated 9 mm cystic focus within the medial left basal ganglia, unchanged in size as compared to the head CT of 02/20/2021. This may reflect a perivascular space or neural glial cyst. There is no acute intracranial hemorrhage. No demarcated cortical infarct. No extra-axial fluid collection. No evidence of an intracranial mass. No midline shift. Vascular: No hyperdense vessel.  Atherosclerotic calcifications. Skull: Normal. Negative for fracture or focal lesion. Sinuses/Orbits: Visualized orbits show no acute finding. Mild bilateral ethmoid sinus mucosal thickening. Partially imaged right maxillary sinus mucous retention cyst. Additionally, mild mucosal thickening is present within the right maxillary sinus at the imaged levels. CT CERVICAL SPINE FINDINGS Alignment: Mild cervical levocurvature. Straightening of the expected cervical lordosis. No signal spondylolisthesis. Skull base and vertebrae: The basion-dental and atlanto-dental intervals are maintained.No evidence of acute fracture to the cervical spine. Soft tissues and spinal canal: No prevertebral fluid or swelling. No visible canal hematoma. Disc levels: Redemonstrated sequela of prior C5-C6 and C6-C7 disc replacement. Cervical spondylosis with multilevel disc space narrowing and shallow multilevel  disc bulges. Disc space narrowing is greatest at C4-C5 (mild-to-moderate). No appreciable high-grade spinal canal stenosis. No compressive bony neural foraminal narrowing. Upper chest: No consolidation within the imaged lung apices. No visible pneumothorax. IMPRESSION: CT head: 1. No evidence of acute intracranial abnormality. 2. Redemonstrated 9 mm cystic focus within the medial left basal ganglia. In correlating with the findings on the prior brain MRI of 05/09/2015, this may reflect a prominent perivascular space or neural glial cyst. 3. Paranasal sinus disease at the imaged levels, as described. CT cervical spine: 1. No evidence of acute fracture to the cervical spine. 2. Straightening of the expected cervical lordosis. 3. Mild cervical levocurvature. 4. Redemonstrated sequela of prior C5-C6 and C6-C7 disc replacement. 5. Cervical spondylosis, as described. Electronically Signed   By: Kellie Simmering DO   On: 04/01/2021 16:36   CT Cervical Spine Wo Contrast  Result Date: 04/01/2021 CLINICAL DATA:  Head trauma, moderate/severe. Additional provided: Patient has fallen multiple times in the last 24 hours, severe headache. Fall 1 month ago as well. Unsteadiness since that time. Patient's wife reports patient fell 5 times yesterday. Patient reports worsening balance, seen "bright light and double vision intermittently." Disorientation. EXAM: CT HEAD WITHOUT CONTRAST CT CERVICAL SPINE WITHOUT CONTRAST TECHNIQUE: Multidetector CT imaging of the head and cervical spine was performed following the standard protocol without intravenous contrast. Multiplanar CT image reconstructions of the cervical spine were also generated. COMPARISON:  Prior head CT examinations 02/20/2021 and earlier. CT of the cervical spine 02/20/2021. Brain MRI 05/09/2015 FINDINGS: CT HEAD FINDINGS Brain: Cerebral volume is normal. Redemonstrated 9 mm cystic focus within the medial left basal ganglia, unchanged in size as compared to the head CT of  02/20/2021. This may reflect a perivascular space or neural glial cyst. There is no acute intracranial hemorrhage. No demarcated cortical infarct. No extra-axial fluid collection. No evidence of an intracranial mass. No midline shift. Vascular: No hyperdense vessel.  Atherosclerotic calcifications. Skull: Normal. Negative for fracture or focal lesion. Sinuses/Orbits: Visualized orbits show no acute finding. Mild bilateral ethmoid sinus mucosal thickening. Partially imaged right maxillary sinus mucous retention cyst. Additionally, mild mucosal thickening is present within the right maxillary sinus at the imaged  levels. CT CERVICAL SPINE FINDINGS Alignment: Mild cervical levocurvature. Straightening of the expected cervical lordosis. No signal spondylolisthesis. Skull base and vertebrae: The basion-dental and atlanto-dental intervals are maintained.No evidence of acute fracture to the cervical spine. Soft tissues and spinal canal: No prevertebral fluid or swelling. No visible canal hematoma. Disc levels: Redemonstrated sequela of prior C5-C6 and C6-C7 disc replacement. Cervical spondylosis with multilevel disc space narrowing and shallow multilevel disc bulges. Disc space narrowing is greatest at C4-C5 (mild-to-moderate). No appreciable high-grade spinal canal stenosis. No compressive bony neural foraminal narrowing. Upper chest: No consolidation within the imaged lung apices. No visible pneumothorax. IMPRESSION: CT head: 1. No evidence of acute intracranial abnormality. 2. Redemonstrated 9 mm cystic focus within the medial left basal ganglia. In correlating with the findings on the prior brain MRI of 05/09/2015, this may reflect a prominent perivascular space or neural glial cyst. 3. Paranasal sinus disease at the imaged levels, as described. CT cervical spine: 1. No evidence of acute fracture to the cervical spine. 2. Straightening of the expected cervical lordosis. 3. Mild cervical levocurvature. 4. Redemonstrated  sequela of prior C5-C6 and C6-C7 disc replacement. 5. Cervical spondylosis, as described. Electronically Signed   By: Kellie Simmering DO   On: 04/01/2021 16:36   MR BRAIN WO CONTRAST  Result Date: 04/01/2021 CLINICAL DATA:  Acute neurologic deficit EXAM: MRI HEAD WITHOUT CONTRAST TECHNIQUE: Multiplanar, multiecho pulse sequences of the brain and surrounding structures were obtained without intravenous contrast. COMPARISON:  05/09/2015 FINDINGS: Brain: No acute infarct, mass effect or extra-axial collection. No acute or chronic hemorrhage. Normal white matter signal, parenchymal volume and CSF spaces. Cystic structure at the inferior left basal ganglia has increased in size from 7 mm to 11 mm. Vascular: Major flow voids are preserved. Skull and upper cervical spine: Normal calvarium and skull base. Visualized upper cervical spine and soft tissues are normal. Sinuses/Orbits:No paranasal sinus fluid levels or advanced mucosal thickening. No mastoid or middle ear effusion. Normal orbits. IMPRESSION: 1. No acute intracranial abnormality. 2. Increased size of cystic structure at the inferior left basal ganglia, likely a neuroglial cyst or enlarged perivascular space. Electronically Signed   By: Ulyses Jarred M.D.   On: 04/01/2021 22:48   DG Foot 2 Views Left  Result Date: 04/01/2021 CLINICAL DATA:  Fall lateral pain EXAM: LEFT FOOT - 2 VIEW COMPARISON:  04/22/2019 FINDINGS: Postsurgical changes of the first metatarsal. No definite acute displaced fracture or malalignment is seen. IMPRESSION: No definite acute osseous abnormality. Electronically Signed   By: Donavan Foil M.D.   On: 04/01/2021 20:10     Assessment and Plan:   Syncope, altered mental status -Etiology unclear, not likely cardiac. -Prior echo and Myoview were normal, telemetry so far with no significant arrhythmias. -Unsure if chronic opiates, intracranial abnormalities contributing.  He is very somnolent during today's exam, medications likely  playing a role. -Repeat echo was ordered, will review -Continue to monitor telemetry for any arrhythmias while admitted -If repeat echo is without gross structural abnormalities, no additional cardiac testing planned.  2.  Hypertension -BP controlled off BP meds -Agree with holding for now  Total encounter time 110 minutes  Greater than 50% was spent in counseling and coordination of care with the patient  Addendum Echocardiogram obtained today reviewed showing no gross abnormalities to suggest cardiac etiology of syncope.  No additional cardiac testing planned at this time.  Continue to monitor patient on telemetry for any arrhythmias.  Cardiology will sign off.  Let us know  if additional input is needed.   Signed, Kate Sable, MD  04/02/2021 12:24 PM

## 2021-04-03 DIAGNOSIS — R4182 Altered mental status, unspecified: Secondary | ICD-10-CM | POA: Diagnosis not present

## 2021-04-03 LAB — CBC
HCT: 31.7 % — ABNORMAL LOW (ref 39.0–52.0)
Hemoglobin: 10 g/dL — ABNORMAL LOW (ref 13.0–17.0)
MCH: 30.6 pg (ref 26.0–34.0)
MCHC: 31.5 g/dL (ref 30.0–36.0)
MCV: 96.9 fL (ref 80.0–100.0)
Platelets: 55 10*3/uL — ABNORMAL LOW (ref 150–400)
RBC: 3.27 MIL/uL — ABNORMAL LOW (ref 4.22–5.81)
RDW: 14.3 % (ref 11.5–15.5)
WBC: 2.1 10*3/uL — ABNORMAL LOW (ref 4.0–10.5)
nRBC: 0 % (ref 0.0–0.2)

## 2021-04-03 LAB — BASIC METABOLIC PANEL
Anion gap: 7 (ref 5–15)
BUN: 14 mg/dL (ref 6–20)
CO2: 26 mmol/L (ref 22–32)
Calcium: 8.8 mg/dL — ABNORMAL LOW (ref 8.9–10.3)
Chloride: 110 mmol/L (ref 98–111)
Creatinine, Ser: 1.19 mg/dL (ref 0.61–1.24)
GFR, Estimated: >60 mL/min (ref >60)
Glucose, Bld: 115 mg/dL — ABNORMAL HIGH (ref 70–99)
Potassium: 4.2 mmol/L (ref 3.5–5.1)
Sodium: 143 mmol/L (ref 135–145)

## 2021-04-03 LAB — LIPASE, BLOOD: Lipase: 62 U/L — ABNORMAL HIGH (ref 11–51)

## 2021-04-03 MED ORDER — VITAMIN D 25 MCG (1000 UNIT) PO TABS
5000.0000 [IU] | ORAL_TABLET | Freq: Every day | ORAL | Status: DC
  Administered 2021-04-03: 5000 [IU] via ORAL

## 2021-04-03 MED ORDER — LOSARTAN POTASSIUM 25 MG PO TABS: 25.0000 mg | ORAL_TABLET | Freq: Every day | ORAL | 3 refills | Status: DC

## 2021-04-03 MED ORDER — VITAMIN D3 25 MCG PO TABS: 5000.0000 [IU] | ORAL_TABLET | Freq: Every day | ORAL | 2 refills | Status: AC

## 2021-04-03 NOTE — Evaluation (Signed)
Physical Therapy Evaluation Patient Details Name: Hector [name] MRN: [id number] DOB: [birth year] Today's Date: 04/03/2021   History of Present Illness  60 y.o. male with medical history significant for bipolar, anxiety, depression, degenerative disc disease, GERD, gout, bunionectomy, history of concussion, hyperlipidemia, hypertension, history of pancreatitis, osteoarthritis, history of vasectomy,obesity, chronic back pain, transthoracic aortic aneurysm, presents emergency department for chief concerns of confusion, falls.   He was unstable on his feet, and reports that while walking to kitchen, he felt unstable and fell.  Having some pain in L foot and R knee, likely associated from fall.`  Clinical Impression  Pt is able to move in bed, rise to standing, ambulate around the nurses' station independently and safely.  He did have mild limp, with minimal acute pain related to his recent fall.  Pt did not have any dizziness, overt unsteadiness, etc and though not quite at his baseline he agrees that he will not require further PT intervention and is eager to go home today as he is feeling much better and essentially back to near baseline.     Follow Up Recommendations No PT follow up    Equipment Recommendations  None recommended by PT    Recommendations for Other Services       Precautions / Restrictions Precautions Precautions: Fall Restrictions Weight Bearing Restrictions: No      Mobility  Bed Mobility Overal bed mobility: Independent             General bed mobility comments: Pt moves easily and indepenently in/to edge of bed    Transfers Overall transfer level: Independent Equipment used: None             General transfer comment: Pt was able to confidently and safely rise to standing w/o assist  Ambulation/Gait Ambulation/Gait assistance: Supervision Gait Distance (Feet): 200 Feet Assistive device: None       General Gait Details: Pt with mild limp on R, no  LOBs or unsteadiness and showed good confidnece despite mild pain.  due to this he is not quite at his baseline, but showed safe and functional mobility for in home and limited community mobility  Stairs            Wheelchair Mobility    Modified Rankin (Stroke Patients Only)       Balance Overall balance assessment: Independent                                           Pertinent Vitals/Pain Pain Assessment: 0-10 Pain Score: 4  Pain Location: L foot and R knee, mild pain    Home Living Family/patient expects to be discharged to:: Private residence Living Arrangements: Spouse/significant other Available Help at Discharge: Family   Home Access: Level entry     Home Layout: One level (bonus room upstairs that he does not need to access) Home Equipment: Environmental consultant - 2 wheels;Cane - single point      Prior Function Level of Independence: Independent               Hand Dominance        Extremity/Trunk Assessment   Upper Extremity Assessment Upper Extremity Assessment: Overall WFL for tasks assessed    Lower Extremity Assessment Lower Extremity Assessment: Overall WFL for tasks assessed (mild hesitancy 2/2 fall associated soreness but functional t/o)       Communication   Communication:  No difficulties  Cognition Arousal/Alertness: Awake/alert Behavior During Therapy: WFL for tasks assessed/performed Overall Cognitive Status: Within Functional Limits for tasks assessed                                        General Comments      Exercises     Assessment/Plan    PT Assessment Patent does not need any further PT services  PT Problem List Decreased activity tolerance;Pain       PT Treatment Interventions      PT Goals (Current goals can be found in the Care Plan section)  Acute Rehab PT Goals Patient Stated Goal: Go home ASAP PT Goal Formulation: All assessment and education complete, DC therapy     Frequency     Barriers to discharge        Co-evaluation               AM-PAC PT "6 Clicks" Mobility  Outcome Measure Help needed turning from your back to your side while in a flat bed without using bedrails?: None Help needed moving from lying on your back to sitting on the side of a flat bed without using bedrails?: None Help needed moving to and from a bed to a chair (including a wheelchair)?: None Help needed standing up from a chair using your arms (e.g., wheelchair or bedside chair)?: None Help needed to walk in hospital room?: None Help needed climbing 3-5 steps with a railing? : None 6 Click Score: 24    End of Session Equipment Utilized During Treatment: Gait belt Activity Tolerance: Patient tolerated treatment well Patient left: with call bell/phone within reach;with chair alarm set Nurse Communication: Mobility status PT Visit Diagnosis: Unsteadiness on feet (R26.81);Difficulty in walking, not elsewhere classified (R26.2)    Time: 3491-7915 PT Time Calculation (min) (ACUTE ONLY): 23 min   Charges:   PT Evaluation $PT Eval Low Complexity: 1 Low          Kreg Shropshire, DPT 04/03/2021, 11:00 AM

## 2021-04-03 NOTE — Discharge Summary (Signed)
[facility] Discharge Summary   Patient: Hector [name] YPP:[id number]  PCP: Janith Lima, MD  Date of admission: 04/01/2021   Date of discharge:  04/03/2021     Discharge Diagnoses:  Principal Problem:   Altered mental status Active Problems:   B12 deficiency   Thrombocytopenia (HCC)   GERD (gastroesophageal reflux disease)   OSA (obstructive sleep apnea)   Sleep apnea, primary central   AKI (acute kidney injury) ([place])   Hypertension   Ascending aortic aneurysm ([place])   Anemia due to acquired thiamine deficiency   Hypotension   Syncope and collapse   Admitted From: Home Disposition:  Home   Recommendations for Outpatient Follow-up:  PCP: in 1 wk Hematologist in 1 week Follow up LABS/TEST:  CBC and BMP in 1-2 wks   Diet recommendation: Cardiac diet  Activity: The patient is advised to gradually reintroduce usual activities, as tolerated  Discharge Condition: stable  Code Status: Full code   History of present illness: As per the H and P dictated on admission Hector [name] is a 60 y.o. male with medical history significant for bipolar, anxiety, depression, degenerative disc disease, GERD, gout, bunionectomy, history of concussion, hyperlipidemia, hypertension, history of pancreatitis, osteoarthritis, history of vasectomy,obesity, chronic back pain, transthoracic aortic aneurysm, presents emergency department for chief concerns of confusion. Hospital Course:  Assessment & Plan: # Multiple falls and syncope: Pt reports his legs gave out yesterday , also reports passing out with concussion few weeks ago. Troponin is negative. EKG unremarkable. Echocardiogram LVEF 65%, normal LV systolic function, no wall motion abnormality. Therapy eval done, no needs.  Patient is back to his baseline, no any active issues, and he would like to go home.  Orthostatic BP was below normal range # Acute metabolic encephalopathy, probably from pain meds vs hypotension vs  dehydration. Resolved and pt appears to be back to baseline. Ammonia level wnl, tSH wnl, vit b12 elevated. CT head and MRI brain does not show any acute stroke. MRI Brain showed Increased size of cystic structure at the inferior left basal ganglia, likely a neuroglial cyst or enlarged perivascular space. Unclear if contributed the AMS and recurrent falls. # Pancytopenia: ? Medication induced. Recommend outpatient follow up with hematology for further work up. # AKI; Probably secondary to dehydration and HCTZ use. Admitted with a creatinine of 2.1, baseline creatinine is around 1.1, Improved to 1.4--1.1 today.  Creatinine improved after IV fluid given for hydration.  Hydrochlorothiazide was discontinued on discharge.  Blood pressure was low, dose of losartan was decreased. Hypertension, blood pressure remains soft, discontinued HCTZ due to dehydration and AKI, decreased dose of losartan from 50 to 25 mg daily, patient was advised to monitor BP at least 2 times in a day and take an additional dose at nighttime if blood pressure remains high greater than 130 mmHg. GERD:STABLE. Mild elevation of lipase: Pt denies any nausea, vomiting or abdominal pain. Mild anemia of chronic disease: Hemoglobin around 10.2.  Body mass index is 25.21 kg/m.  Nutrition Interventions:  - Patient was instructed, not to drive, operate heavy machinery, perform activities at heights, swimming or participation in water activities or provide baby sitting services while on Pain, Sleep and Anxiety Medications; until his outpatient Physician has advised to do so again.  - Also recommended to not to take more than prescribed Pain, Sleep and Anxiety Medications.  Patient was ambulatory without any assistance. Patient was seen by physical therapy, who recommended no needs. On the day of the discharge  the patient's vitals were stable, and no other acute medical condition were reported by patient. the patient was felt safe to be  discharge at Home.  Consultants: None Procedures: none  Discharge Exam: General: Appear in no distress, no Rash; Oral Mucosa Clear, moist. Cardiovascular: S1 and S2 Present, no Murmur, Respiratory: normal respiratory effort, Bilateral Air entry present and no Crackles, no wheezes Abdomen: Bowel Sound present, Soft and no tenderness, no hernia Extremities: no Pedal edema, no calf tenderness Neurology: alert and oriented to time, place, and person affect appropriate.  Filed Weights   04/01/21 1504 04/01/21 2031  Weight: 72.6 kg 79.7 kg   Vitals:   04/03/21 0510 04/03/21 0734  BP: (!) 93/54 108/67  Pulse: 76 82  Resp: 15 16  Temp: (!) 97.5 F (36.4 C) 98.3 F (36.8 C)  SpO2: 94% 97%    DISCHARGE MEDICATION: Allergies as of 04/03/2021       Reactions   Aspirin Other (See Comments)   perforated ulcer   Cymbalta [duloxetine Hcl] Other (See Comments)   Over-sedating; slept over 24 hours with one dose.  Possible serotonin syndrome.   Nsaids Other (See Comments)   Perforated ulcer   Tape Rash   Surgical tape   Hydrocodone Other (See Comments)   headache   Vancomycin Rash   Red man syndrome.  Please premedicate.        Medication List     STOP taking these medications    celecoxib 200 MG capsule Commonly known as: CELEBREX   diazepam 5 MG tablet Commonly known as: VALIUM   HYDROmorphone 8 MG tablet Commonly known as: DILAUDID   losartan-hydrochlorothiazide 50-12.5 MG tablet Commonly known as: HYZAAR   thiamine 50 MG tablet Commonly known as: VITAMIN B-1   zinc gluconate 50 MG tablet       TAKE these medications    albuterol 108 (90 Base) MCG/ACT inhaler Commonly known as: VENTOLIN HFA Inhale 2 puffs into the lungs every 6 (six) hours as needed for wheezing or shortness of breath.   allopurinol 100 MG tablet Commonly known as: ZYLOPRIM Take 1 tablet (100 mg total) by mouth daily.   colchicine 0.6 MG tablet TAKE ONE TABLET BY MOUTH DAILY AS  NEEDED FOR GOUT   E-Z Spacer inhaler Use as instructed   escitalopram 20 MG tablet Commonly known as: LEXAPRO Take 20 mg by mouth every morning.   folic acid 1 MG tablet Commonly known as: FOLVITE Take 1 tablet (1 mg total) by mouth daily.   KRILL & FISH OIL BLEND PO Take 1 capsule by mouth daily.   lamoTRIgine 100 MG tablet Commonly known as: LAMICTAL Take 150 mg by mouth 2 (two) times daily.   losartan 25 MG tablet Commonly known as: Cozaar Take 1 tablet (25 mg total) by mouth daily. Take if SBP >130 mmHg in am and take additional dose at night if SBP >130 mmHg   meloxicam 15 MG tablet Commonly known as: MOBIC TAKE ONE TABLET BY MOUTH DAILY   nitroGLYCERIN 0.3 MG SL tablet Commonly known as: Nitrostat Place 1 tablet (0.3 mg total) under the tongue every 5 (five) minutes as needed for chest pain.   omeprazole 40 MG capsule Commonly known as: PRILOSEC Take 40 mg by mouth daily.   oxyCODONE-acetaminophen 10-325 MG tablet Commonly known as: PERCOCET Take 1 tablet by mouth 2 (two) times daily as needed for pain. Takes 1 tablet in the morning and 1 tablet at bedtime   pregabalin 150 MG capsule  Commonly known as: LYRICA Take 150 mg by mouth 2 (two) times daily. What changed: Another medication with the same name was removed. Continue taking this medication, and follow the directions you see here.   tamsulosin 0.4 MG Caps capsule Commonly known as: FLOMAX Take 0.4 mg by mouth daily as needed (kidney stones).   traZODone 100 MG tablet Commonly known as: DESYREL Take 300 mg by mouth at bedtime.   Vitamin D3 25 MCG tablet Commonly known as: Vitamin D Take 5 tablets (5,000 Units total) by mouth daily. What changed:  medication strength how much to take   vitamin E 180 MG (400 UNITS) capsule Generic drug: vitamin E Take 400 Units by mouth daily.       Allergies  Allergen Reactions   Aspirin Other (See Comments)    perforated ulcer   Cymbalta [Duloxetine  Hcl] Other (See Comments)    Over-sedating; slept over 24 hours with one dose.  Possible serotonin syndrome.   Nsaids Other (See Comments)    Perforated ulcer   Tape Rash    Surgical tape   Hydrocodone Other (See Comments)    headache   Vancomycin Rash    Red man syndrome.  Please premedicate.   Discharge Instructions     Call MD for:  extreme fatigue   Complete by: As directed    Call MD for:  persistant dizziness or light-headedness   Complete by: As directed    Call MD for:  severe uncontrolled pain   Complete by: As directed    Call MD for:  temperature >100.4   Complete by: As directed    Diet - low sodium heart healthy   Complete by: As directed    Discharge instructions   Complete by: As directed    F/u with PCP in 1 wk F/u with Hematologist in 1 wk for Pancytopenia and h/o PE and D.dimer elevated but no symptoms at this time. Pt is aware   Increase activity slowly   Complete by: As directed        The results of significant diagnostics from this hospitalization (including imaging, microbiology, ancillary and laboratory) are listed below for reference.    Significant Diagnostic Studies: DG Chest 2 View  Result Date: 04/01/2021 CLINICAL DATA:  Hypotension, migraine EXAM: CHEST - 2 VIEW COMPARISON:  10/17/2020 FINDINGS: Frontal and lateral views of the chest demonstrate an unremarkable cardiac silhouette. No acute airspace disease, effusion, or pneumothorax. There are no acute bony abnormalities. IMPRESSION: 1. No acute intrathoracic process. Electronically Signed   By: Randa Ngo M.D.   On: 04/01/2021 16:32   CT Head Wo Contrast  Result Date: 04/01/2021 CLINICAL DATA:  Head trauma, moderate/severe. Additional provided: Patient has fallen multiple times in the last 24 hours, severe headache. Fall 1 month ago as well. Unsteadiness since that time. Patient's wife reports patient fell 5 times yesterday. Patient reports worsening balance, seen "bright light and double  vision intermittently." Disorientation. EXAM: CT HEAD WITHOUT CONTRAST CT CERVICAL SPINE WITHOUT CONTRAST TECHNIQUE: Multidetector CT imaging of the head and cervical spine was performed following the standard protocol without intravenous contrast. Multiplanar CT image reconstructions of the cervical spine were also generated. COMPARISON:  Prior head CT examinations 02/20/2021 and earlier. CT of the cervical spine 02/20/2021. Brain MRI 05/09/2015 FINDINGS: CT HEAD FINDINGS Brain: Cerebral volume is normal. Redemonstrated 9 mm cystic focus within the medial left basal ganglia, unchanged in size as compared to the head CT of 02/20/2021. This may reflect a perivascular  space or neural glial cyst. There is no acute intracranial hemorrhage. No demarcated cortical infarct. No extra-axial fluid collection. No evidence of an intracranial mass. No midline shift. Vascular: No hyperdense vessel.  Atherosclerotic calcifications. Skull: Normal. Negative for fracture or focal lesion. Sinuses/Orbits: Visualized orbits show no acute finding. Mild bilateral ethmoid sinus mucosal thickening. Partially imaged right maxillary sinus mucous retention cyst. Additionally, mild mucosal thickening is present within the right maxillary sinus at the imaged levels. CT CERVICAL SPINE FINDINGS Alignment: Mild cervical levocurvature. Straightening of the expected cervical lordosis. No signal spondylolisthesis. Skull base and vertebrae: The basion-dental and atlanto-dental intervals are maintained.No evidence of acute fracture to the cervical spine. Soft tissues and spinal canal: No prevertebral fluid or swelling. No visible canal hematoma. Disc levels: Redemonstrated sequela of prior C5-C6 and C6-C7 disc replacement. Cervical spondylosis with multilevel disc space narrowing and shallow multilevel disc bulges. Disc space narrowing is greatest at C4-C5 (mild-to-moderate). No appreciable high-grade spinal canal stenosis. No compressive bony neural  foraminal narrowing. Upper chest: No consolidation within the imaged lung apices. No visible pneumothorax. IMPRESSION: CT head: 1. No evidence of acute intracranial abnormality. 2. Redemonstrated 9 mm cystic focus within the medial left basal ganglia. In correlating with the findings on the prior brain MRI of 05/09/2015, this may reflect a prominent perivascular space or neural glial cyst. 3. Paranasal sinus disease at the imaged levels, as described. CT cervical spine: 1. No evidence of acute fracture to the cervical spine. 2. Straightening of the expected cervical lordosis. 3. Mild cervical levocurvature. 4. Redemonstrated sequela of prior C5-C6 and C6-C7 disc replacement. 5. Cervical spondylosis, as described. Electronically Signed   By: Kellie Simmering DO   On: 04/01/2021 16:36   CT Cervical Spine Wo Contrast  Result Date: 04/01/2021 CLINICAL DATA:  Head trauma, moderate/severe. Additional provided: Patient has fallen multiple times in the last 24 hours, severe headache. Fall 1 month ago as well. Unsteadiness since that time. Patient's wife reports patient fell 5 times yesterday. Patient reports worsening balance, seen "bright light and double vision intermittently." Disorientation. EXAM: CT HEAD WITHOUT CONTRAST CT CERVICAL SPINE WITHOUT CONTRAST TECHNIQUE: Multidetector CT imaging of the head and cervical spine was performed following the standard protocol without intravenous contrast. Multiplanar CT image reconstructions of the cervical spine were also generated. COMPARISON:  Prior head CT examinations 02/20/2021 and earlier. CT of the cervical spine 02/20/2021. Brain MRI 05/09/2015 FINDINGS: CT HEAD FINDINGS Brain: Cerebral volume is normal. Redemonstrated 9 mm cystic focus within the medial left basal ganglia, unchanged in size as compared to the head CT of 02/20/2021. This may reflect a perivascular space or neural glial cyst. There is no acute intracranial hemorrhage. No demarcated cortical infarct. No  extra-axial fluid collection. No evidence of an intracranial mass. No midline shift. Vascular: No hyperdense vessel.  Atherosclerotic calcifications. Skull: Normal. Negative for fracture or focal lesion. Sinuses/Orbits: Visualized orbits show no acute finding. Mild bilateral ethmoid sinus mucosal thickening. Partially imaged right maxillary sinus mucous retention cyst. Additionally, mild mucosal thickening is present within the right maxillary sinus at the imaged levels. CT CERVICAL SPINE FINDINGS Alignment: Mild cervical levocurvature. Straightening of the expected cervical lordosis. No signal spondylolisthesis. Skull base and vertebrae: The basion-dental and atlanto-dental intervals are maintained.No evidence of acute fracture to the cervical spine. Soft tissues and spinal canal: No prevertebral fluid or swelling. No visible canal hematoma. Disc levels: Redemonstrated sequela of prior C5-C6 and C6-C7 disc replacement. Cervical spondylosis with multilevel disc space narrowing and shallow multilevel disc  bulges. Disc space narrowing is greatest at C4-C5 (mild-to-moderate). No appreciable high-grade spinal canal stenosis. No compressive bony neural foraminal narrowing. Upper chest: No consolidation within the imaged lung apices. No visible pneumothorax. IMPRESSION: CT head: 1. No evidence of acute intracranial abnormality. 2. Redemonstrated 9 mm cystic focus within the medial left basal ganglia. In correlating with the findings on the prior brain MRI of 05/09/2015, this may reflect a prominent perivascular space or neural glial cyst. 3. Paranasal sinus disease at the imaged levels, as described. CT cervical spine: 1. No evidence of acute fracture to the cervical spine. 2. Straightening of the expected cervical lordosis. 3. Mild cervical levocurvature. 4. Redemonstrated sequela of prior C5-C6 and C6-C7 disc replacement. 5. Cervical spondylosis, as described. Electronically Signed   By: Kellie Simmering DO   On: 04/01/2021  16:36   MR BRAIN WO CONTRAST  Result Date: 04/01/2021 CLINICAL DATA:  Acute neurologic deficit EXAM: MRI HEAD WITHOUT CONTRAST TECHNIQUE: Multiplanar, multiecho pulse sequences of the brain and surrounding structures were obtained without intravenous contrast. COMPARISON:  05/09/2015 FINDINGS: Brain: No acute infarct, mass effect or extra-axial collection. No acute or chronic hemorrhage. Normal white matter signal, parenchymal volume and CSF spaces. Cystic structure at the inferior left basal ganglia has increased in size from 7 mm to 11 mm. Vascular: Major flow voids are preserved. Skull and upper cervical spine: Normal calvarium and skull base. Visualized upper cervical spine and soft tissues are normal. Sinuses/Orbits:No paranasal sinus fluid levels or advanced mucosal thickening. No mastoid or middle ear effusion. Normal orbits. IMPRESSION: 1. No acute intracranial abnormality. 2. Increased size of cystic structure at the inferior left basal ganglia, likely a neuroglial cyst or enlarged perivascular space. Electronically Signed   By: Ulyses Jarred M.D.   On: 04/01/2021 22:48   DG Foot 2 Views Left  Result Date: 04/01/2021 CLINICAL DATA:  Fall lateral pain EXAM: LEFT FOOT - 2 VIEW COMPARISON:  04/22/2019 FINDINGS: Postsurgical changes of the first metatarsal. No definite acute displaced fracture or malalignment is seen. IMPRESSION: No definite acute osseous abnormality. Electronically Signed   By: Donavan Foil M.D.   On: 04/01/2021 20:10   ECHOCARDIOGRAM COMPLETE  Result Date: 04/02/2021    ECHOCARDIOGRAM REPORT   Patient Name:   TREMAIN RUCINSKI Date of Exam: 04/02/2021 Medical Rec #:  [id number]    Height:       70.0 in Accession #:    [id number]   Weight:       175.7 lb Date of Birth:  05/29/1961     BSA:          1.976 m Patient Age:    60 years     BP:           110/76 mmHg Patient Gender: M            HR:           101 bpm. Exam Location:  [facility] Procedure: 2D Echo, Cardiac Doppler and Color Doppler  Indications:     Syncope R55  History:         Patient has prior history of Echocardiogram examinations, most                  recent 09/27/2018. Risk Factors:Diabetes. Anxiety, obstructive                  sleep apnea.  Sonographer:     Sherrie Sport RDCS (AE) Referring Phys:  Theola Sequin Diagnosing Phys: Serafina Royals  MD  Sonographer Comments: Suboptimal apical window. IMPRESSIONS  1. Left ventricular ejection fraction, by estimation, is 60 to 65%. The left ventricle has normal function. The left ventricle has no regional wall motion abnormalities. Left ventricular diastolic parameters were normal.  2. Right ventricular systolic function is normal. The right ventricular size is normal.  3. The mitral valve is normal in structure. Trivial mitral valve regurgitation.  4. The aortic valve is normal in structure. Aortic valve regurgitation is trivial. FINDINGS  Left Ventricle: Left ventricular ejection fraction, by estimation, is 60 to 65%. The left ventricle has normal function. The left ventricle has no regional wall motion abnormalities. The left ventricular internal cavity size was normal in size. There is  no left ventricular hypertrophy. Left ventricular diastolic parameters were normal. Right Ventricle: The right ventricular size is normal. No increase in right ventricular wall thickness. Right ventricular systolic function is normal. Left Atrium: Left atrial size was normal in size. Right Atrium: Right atrial size was normal in size. Pericardium: There is no evidence of pericardial effusion. Mitral Valve: The mitral valve is normal in structure. Trivial mitral valve regurgitation. Tricuspid Valve: The tricuspid valve is normal in structure. Tricuspid valve regurgitation is trivial. Aortic Valve: The aortic valve is normal in structure. Aortic valve regurgitation is trivial. Aortic valve mean gradient measures 4.0 mmHg. Aortic valve peak gradient measures 7.3 mmHg. Aortic valve area, by VTI measures 2.54  cm. Pulmonic Valve: The pulmonic valve was normal in structure. Pulmonic valve regurgitation is not visualized. Aorta: The aortic root and ascending aorta are structurally normal, with no evidence of dilitation. IAS/Shunts: No atrial level shunt detected by color flow Doppler.  LEFT VENTRICLE PLAX 2D LVIDd:         4.50 cm  Diastology LVIDs:         2.40 cm  LV e' medial:    7.07 cm/s LV PW:         1.60 cm  LV E/e' medial:  15.3 LV IVS:        0.90 cm  LV e' lateral:   10.70 cm/s LVOT diam:     2.10 cm  LV E/e' lateral: 10.1 LV SV:         68 LV SV Index:   34 LVOT Area:     3.46 cm  RIGHT VENTRICLE RV Basal diam:  3.10 cm RV S prime:     16.80 cm/s TAPSE (M-mode): 4.7 cm LEFT ATRIUM             Index       RIGHT ATRIUM           Index LA diam:        3.30 cm 1.67 cm/m  RA Area:     15.80 cm LA Vol (A2C):   63.3 ml 32.04 ml/m RA Volume:   37.30 ml  18.88 ml/m LA Vol (A4C):   30.8 ml 15.59 ml/m LA Biplane Vol: 46.2 ml 23.39 ml/m  AORTIC VALVE                   PULMONIC VALVE AV Area (Vmax):    2.36 cm    PV Vmax:        0.51 m/s AV Area (Vmean):   2.51 cm    PV Peak grad:   1.0 mmHg AV Area (VTI):     2.54 cm    RVOT Peak grad: 2 mmHg AV Vmax:  135.00 cm/s AV Vmean:          95.200 cm/s AV VTI:            0.266 m AV Peak Grad:      7.3 mmHg AV Mean Grad:      4.0 mmHg LVOT Vmax:         92.00 cm/s LVOT Vmean:        69.000 cm/s LVOT VTI:          0.195 m LVOT/AV VTI ratio: 0.73  AORTA Ao Root diam: 4.20 cm MITRAL VALVE                TRICUSPID VALVE MV Area (PHT): 3.68 cm     TR Peak grad:   15.2 mmHg MV Decel Time: 206 msec     TR Vmax:        195.00 cm/s MV E velocity: 108.00 cm/s MV A velocity: 95.10 cm/s   SHUNTS MV E/A ratio:  1.14         Systemic VTI:  0.20 m                             Systemic Diam: 2.10 cm Serafina Royals MD Electronically signed by Serafina Royals MD Signature Date/Time: 04/02/2021/1:09:49 PM    Final     Microbiology: Recent Results (from the past 240 hour(s))  Resp  Panel by RT-PCR (Flu A&B, Covid)     Status: None   Collection Time: 04/01/21  5:34 PM   Specimen: Nasopharyngeal(NP) swabs in vial transport medium  Result Value Ref Range Status   SARS Coronavirus 2 by RT PCR NEGATIVE NEGATIVE Final    Comment: (NOTE) SARS-CoV-2 target nucleic acids are NOT DETECTED.  The SARS-CoV-2 RNA is generally detectable in upper respiratory specimens during the acute phase of infection. The lowest concentration of SARS-CoV-2 viral copies this assay can detect is 138 copies/mL. A negative result does not preclude SARS-Cov-2 infection and should not be used as the sole basis for treatment or other patient management decisions. A negative result may occur with  improper specimen collection/handling, submission of specimen other than nasopharyngeal swab, presence of viral mutation(s) within the areas targeted by this assay, and inadequate number of viral copies(<138 copies/mL). A negative result must be combined with clinical observations, patient history, and epidemiological information. The expected result is Negative.  Fact Sheet for Patients:  EntrepreneurPulse.com.au  Fact Sheet for Healthcare Providers:  IncredibleEmployment.be  This test is no t yet approved or cleared by the [place] FDA and  has been authorized for detection and/or diagnosis of SARS-CoV-2 by FDA under an Emergency Use Authorization (EUA). This EUA will remain  in effect (meaning this test can be used) for the duration of the COVID-19 declaration under Section 564(b)(1) of the Act, 21 U.S.C.section 360bbb-3(b)(1), unless the authorization is terminated  or revoked sooner.       Influenza A by PCR NEGATIVE NEGATIVE Final   Influenza B by PCR NEGATIVE NEGATIVE Final    Comment: (NOTE) The Xpert Xpress SARS-CoV-2/FLU/RSV plus assay is intended as an aid in the diagnosis of influenza from Nasopharyngeal swab specimens and should not be used as  a sole basis for treatment. Nasal washings and aspirates are unacceptable for Xpert Xpress SARS-CoV-2/FLU/RSV testing.  Fact Sheet for Patients: EntrepreneurPulse.com.au  Fact Sheet for Healthcare Providers: IncredibleEmployment.be  This test is not yet approved or cleared by the [place] FDA and has  been authorized for detection and/or diagnosis of SARS-CoV-2 by FDA under an Emergency Use Authorization (EUA). This EUA will remain in effect (meaning this test can be used) for the duration of the COVID-19 declaration under Section 564(b)(1) of the Act, 21 U.S.C. section 360bbb-3(b)(1), unless the authorization is terminated or revoked.  Performed at [facility], [address] [place], [place] [address]      Labs: CBC: Recent Labs  Lab 04/01/21 1506 04/01/21 1553 04/02/21 0609 04/03/21 0621  WBC 4.0 3.9* 2.7* 2.1*  NEUTROABS  --  2.1  --   --   HGB 11.4* 11.3* 10.2* 10.0*  HCT 35.6* 35.4* 31.7* 31.7*  MCV 98.1 97.8 97.5 96.9  PLT 100* 101* 62* 55*   Basic Metabolic Panel: Recent Labs  Lab 04/01/21 1506 04/01/21 1720 04/02/21 0609 04/03/21 0621  NA 138  --  142 143  K 3.7  --  3.8 4.2  CL 106  --  111 110  CO2 23  --  25 26  GLUCOSE 178*  --  101* 115*  BUN 13  --  12 14  CREATININE 2.14*  --  1.40* 1.19  CALCIUM 8.4*  --  8.2* 8.8*  MG  --  1.5* 1.8  --    Liver Function Tests: Recent Labs  Lab 04/01/21 1530  AST 42*  ALT 15  ALKPHOS 79  BILITOT 0.6  PROT 6.5  ALBUMIN 3.6   Recent Labs  Lab 04/01/21 1530 04/03/21 0621  LIPASE 61* 62*   Recent Labs  Lab 04/01/21 1729  AMMONIA 9   Cardiac Enzymes: No results for input(s): CKTOTAL, CKMB, CKMBINDEX, TROPONINI in the last 168 hours. BNP (last 3 results) Recent Labs    04/01/21 1530  BNP 61.4   CBG: No results for input(s): GLUCAP in the last 168 hours.  Time spent: 35 minutes  Signed:  Val Riles  [facility]   04/03/2021 10:16 AM

## 2021-04-03 NOTE — Plan of Care (Signed)

## 2021-04-05 LAB — VITAMIN B1: Vitamin B1 (Thiamine): 68.5 nmol/L (ref 66.5–200.0)

## 2021-04-09 DIAGNOSIS — F4310 Post-traumatic stress disorder, unspecified: Secondary | ICD-10-CM | POA: Diagnosis not present

## 2021-04-09 DIAGNOSIS — F3132 Bipolar disorder, current episode depressed, moderate: Secondary | ICD-10-CM | POA: Diagnosis not present

## 2021-04-10 DIAGNOSIS — M1711 Unilateral primary osteoarthritis, right knee: Secondary | ICD-10-CM | POA: Diagnosis not present

## 2021-05-08 ENCOUNTER — Other Ambulatory Visit: Payer: Self-pay

## 2021-05-08 ENCOUNTER — Encounter: Payer: Self-pay | Admitting: Internal Medicine

## 2021-05-08 ENCOUNTER — Ambulatory Visit: Payer: Federal, State, Local not specified - PPO | Admitting: Internal Medicine

## 2021-05-08 VITALS — BP 122/70 | HR 78 | Temp 99.1°F | Ht 70.0 in | Wt 170.8 lb

## 2021-05-08 DIAGNOSIS — M1A09X0 Idiopathic chronic gout, multiple sites, without tophus (tophi): Secondary | ICD-10-CM | POA: Diagnosis not present

## 2021-05-08 DIAGNOSIS — D520 Dietary folate deficiency anemia: Secondary | ICD-10-CM

## 2021-05-08 DIAGNOSIS — G6289 Other specified polyneuropathies: Secondary | ICD-10-CM | POA: Diagnosis not present

## 2021-05-08 DIAGNOSIS — I10 Essential (primary) hypertension: Secondary | ICD-10-CM

## 2021-05-08 DIAGNOSIS — D696 Thrombocytopenia, unspecified: Secondary | ICD-10-CM | POA: Diagnosis not present

## 2021-05-08 DIAGNOSIS — E519 Thiamine deficiency, unspecified: Secondary | ICD-10-CM

## 2021-05-08 DIAGNOSIS — E538 Deficiency of other specified B group vitamins: Secondary | ICD-10-CM

## 2021-05-08 DIAGNOSIS — D538 Other specified nutritional anemias: Secondary | ICD-10-CM | POA: Diagnosis not present

## 2021-05-08 DIAGNOSIS — R739 Hyperglycemia, unspecified: Secondary | ICD-10-CM

## 2021-05-08 DIAGNOSIS — G629 Polyneuropathy, unspecified: Secondary | ICD-10-CM | POA: Insufficient documentation

## 2021-05-08 DIAGNOSIS — Z23 Encounter for immunization: Secondary | ICD-10-CM | POA: Diagnosis not present

## 2021-05-08 LAB — CBC WITH DIFFERENTIAL/PLATELET
Basophils Absolute: 0 10*3/uL (ref 0.0–0.1)
Basophils Relative: 0.7 % (ref 0.0–3.0)
Eosinophils Absolute: 0.1 10*3/uL (ref 0.0–0.7)
Eosinophils Relative: 2.5 % (ref 0.0–5.0)
HCT: 34.4 % — ABNORMAL LOW (ref 39.0–52.0)
Hemoglobin: 11.1 g/dL — ABNORMAL LOW (ref 13.0–17.0)
Lymphocytes Relative: 29.6 % (ref 12.0–46.0)
Lymphs Abs: 1.1 10*3/uL (ref 0.7–4.0)
MCHC: 32.2 g/dL (ref 30.0–36.0)
MCV: 90.6 fl (ref 78.0–100.0)
Monocytes Absolute: 0.3 10*3/uL (ref 0.1–1.0)
Monocytes Relative: 7.9 % (ref 3.0–12.0)
Neutro Abs: 2.1 10*3/uL (ref 1.4–7.7)
Neutrophils Relative %: 59.3 % (ref 43.0–77.0)
Platelets: 94 10*3/uL — ABNORMAL LOW (ref 150.0–400.0)
RBC: 3.79 Mil/uL — ABNORMAL LOW (ref 4.22–5.81)
RDW: 15.7 % — ABNORMAL HIGH (ref 11.5–15.5)
WBC: 3.6 10*3/uL — ABNORMAL LOW (ref 4.0–10.5)

## 2021-05-08 LAB — HEMOGLOBIN A1C: Hgb A1c MFr Bld: 5.7 % (ref 4.6–6.5)

## 2021-05-08 LAB — FOLATE: Folate: >24.4 ng/mL (ref >5.9)

## 2021-05-08 MED ORDER — ALLOPURINOL 100 MG PO TABS: 100.0000 mg | ORAL_TABLET | Freq: Every day | ORAL | 1 refills | Status: DC

## 2021-05-08 NOTE — Patient Instructions (Signed)
[employer] medicine (25th ed., pp. 1059-1068). [place], [place]: Elsevier.">  Anemia  Anemia is a condition in which there is not enough red blood cells or hemoglobin in the blood. Hemoglobin is a substance in red blood cells thatcarries oxygen. When you do not have enough red blood cells or hemoglobin (are anemic), your body cannot get enough oxygen and your organs may not work properly. Asa result, you may feel very tired or have other problems. What are the causes? Common causes of anemia include: Excessive bleeding. Anemia can be caused by excessive bleeding inside or outside the body, including bleeding from the intestines or from heavy menstrual periods in females. Poor nutrition. Long-lasting (chronic) kidney, thyroid, and liver disease. Bone marrow disorders, spleen problems, and blood disorders. Cancer and treatments for cancer. HIV (human immunodeficiency virus) and AIDS (acquired immunodeficiency syndrome). Infections, medicines, and autoimmune disorders that destroy red blood cells. What are the signs or symptoms? Symptoms of this condition include: Minor weakness. Dizziness. Headache, or difficulties concentrating and sleeping. Heartbeats that feel irregular or faster than normal (palpitations). Shortness of breath, especially with exercise. Pale skin, lips, and nails, or cold hands and feet. Indigestion and nausea. Symptoms may occur suddenly or develop slowly. If your anemia is mild, you maynot have symptoms. How is this diagnosed? This condition is diagnosed based on blood tests, your medical history, and a physical exam. In some cases, a test may be needed in which cells are removed from the soft tissue inside of a bone and looked at under a microscope (bone marrow biopsy). Your health care provider may also check your stool (feces) for blood and may do additional testing to look for the cause of yourbleeding. Other tests may include: Imaging tests, such as a CT scan or  MRI. A procedure to see inside your esophagus and stomach (endoscopy). A procedure to see inside your colon and rectum (colonoscopy). How is this treated? Treatment for this condition depends on the cause. If you continue to lose a lot of blood, you may need to be treated at a hospital. Treatment may include: Taking supplements of iron, vitamin B12, or folic acid. Taking a hormone medicine (erythropoietin) that can help to stimulate red blood cell growth. Having a blood transfusion. This may be needed if you lose a lot of blood. Making changes to your diet. Having surgery to remove your spleen. Follow these instructions at home: Take over-the-counter and prescription medicines only as told by your health care provider. Take supplements only as told by your health care provider. Follow any diet instructions that you were given by your health care provider. Keep all follow-up visits as told by your health care provider. This is important. Contact a health care provider if: You develop new bleeding anywhere in the body. Get help right away if: You are very weak. You are short of breath. You have pain in your abdomen or chest. You are dizzy or feel faint. You have trouble concentrating. You have bloody stools, black stools, or tarry stools. You vomit repeatedly or you vomit up blood. These symptoms may represent a serious problem that is an emergency. Do not wait to see if the symptoms will go away. Get medical help right away. Call your local emergency services (911 in the [place] Do not drive yourself to the hospital. Summary Anemia is a condition in which you do not have enough red blood cells or enough of a substance in your red blood cells that carries oxygen (hemoglobin). Symptoms may occur suddenly   or develop slowly. If your anemia is mild, you may not have symptoms. This condition is diagnosed with blood tests, a medical history, and a physical exam. Other tests may be  needed. Treatment for this condition depends on the cause of the anemia. This information is not intended to replace advice given to you by your health care provider. Make sure you discuss any questions you have with your healthcare provider. Document Revised: 08/09/2019 Document Reviewed: 08/09/2019 Elsevier Patient Education  2022 [employer].

## 2021-05-08 NOTE — Progress Notes (Signed)
Subjective:  Patient ID: Hector [name], male    DOB: 1960-10-29  Age: 60 y.o. MRN: [id number]  CC: Follow-up (Patient states that he fell 5 times in one day then once the next day. Went to ED and was dehydrated and diagnosed with renal failure) and Anemia  This visit occurred during the SARS-CoV-2 public health emergency.  Safety protocols were in place, including screening questions prior to the visit, additional usage of staff PPE, and extensive cleaning of exam room while observing appropriate contact time as indicated for disinfecting solutions.    HPI INES STILSON presents for f/up - He has a history of neuropathy and would like to establish with a neurologist.  He says the neuropathy is not painful but it does cause numbness and tingling.  He is also had a recent fall that was complicated by dehydration and anemia.  Outpatient Medications Prior to Visit  Medication Sig Dispense Refill   albuterol (PROVENTIL HFA;VENTOLIN HFA) 108 (90 Base) MCG/ACT inhaler Inhale 2 puffs into the lungs every 6 (six) hours as needed for wheezing or shortness of breath. 1 Inhaler 0   cholecalciferol (VITAMIN D) 25 MCG tablet Take 5 tablets (5,000 Units total) by mouth daily. 150 tablet 2   colchicine 0.6 MG tablet TAKE ONE TABLET BY MOUTH DAILY AS NEEDED FOR GOUT 30 tablet 1   escitalopram (LEXAPRO) 20 MG tablet Take 20 mg by mouth every morning.      Fish Oil-Krill Oil (KRILL & FISH OIL BLEND PO) Take 1 capsule by mouth daily.     folic acid (FOLVITE) 1 MG tablet Take 1 tablet (1 mg total) by mouth daily. 90 tablet 1   lamoTRIgine (LAMICTAL) 100 MG tablet Take 150 mg by mouth 2 (two) times daily.      losartan (COZAAR) 25 MG tablet Take 1 tablet (25 mg total) by mouth daily. Take if SBP >130 mmHg in am and take additional dose at night if SBP >130 mmHg 90 tablet 3   meloxicam (MOBIC) 15 MG tablet TAKE ONE TABLET BY MOUTH DAILY 30 tablet 0   omeprazole (PRILOSEC) 40 MG capsule Take 40 mg by mouth daily.      oxyCODONE-acetaminophen (PERCOCET) 10-325 MG tablet Take 1 tablet by mouth 2 (two) times daily as needed for pain. Takes 1 tablet in the morning and 1 tablet at bedtime     pregabalin (LYRICA) 150 MG capsule Take 150 mg by mouth 2 (two) times daily.     Spacer/Aero-Holding Chambers (E-Z SPACER) inhaler Use as instructed 1 each 2   tamsulosin (FLOMAX) 0.4 MG CAPS capsule Take 0.4 mg by mouth daily as needed (kidney stones).      traZODone (DESYREL) 100 MG tablet Take 300 mg by mouth at bedtime.     vitamin E 180 MG (400 UNITS) capsule Take 400 Units by mouth daily.     allopurinol (ZYLOPRIM) 100 MG tablet Take 1 tablet (100 mg total) by mouth daily. 30 tablet 2   nitroGLYCERIN (NITROSTAT) 0.3 MG SL tablet Place 1 tablet (0.3 mg total) under the tongue every 5 (five) minutes as needed for chest pain. 100 tablet 3   No facility-administered medications prior to visit.    ROS Review of Systems  Constitutional:  Positive for fatigue. Negative for diaphoresis and unexpected weight change.  HENT: Negative.    Eyes: Negative.   Respiratory:  Negative for cough, chest tightness, shortness of breath and wheezing.   Cardiovascular:  Negative for chest pain, palpitations  and leg swelling.  Gastrointestinal:  Negative for abdominal pain, constipation, diarrhea, nausea and vomiting.  Endocrine: Negative.   Genitourinary: Negative.  Negative for difficulty urinating.  Musculoskeletal:  Negative for back pain and myalgias.  Skin: Negative.   Neurological:  Positive for dizziness, weakness and numbness.  Hematological:  Negative for adenopathy. Does not bruise/bleed easily.  Psychiatric/Behavioral: Negative.     Objective:  BP 122/70 (BP Location: Right Arm, Patient Position: Sitting, Cuff Size: Normal)   Pulse 78   Temp 99.1 F (37.3 C) (Oral)   Ht '5\' 10"'$  (1.778 m)   Wt 170 lb 12.8 oz (77.5 kg)   SpO2 97%   BMI 24.51 kg/m   BP Readings from Last 3 Encounters:  05/08/21 122/70  04/03/21  108/67  02/20/21 (!) 140/101    Wt Readings from Last 3 Encounters:  05/08/21 170 lb 12.8 oz (77.5 kg)  04/01/21 175 lb 11.3 oz (79.7 kg)  02/20/21 160 lb (72.6 kg)    Physical Exam Vitals reviewed.  HENT:     Nose: Nose normal.     Mouth/Throat:     Mouth: Mucous membranes are moist.  Eyes:     Conjunctiva/sclera: Conjunctivae normal.  Cardiovascular:     Rate and Rhythm: Normal rate and regular rhythm.     Heart sounds: No murmur heard. Pulmonary:     Effort: Pulmonary effort is normal.     Breath sounds: No stridor. No wheezing, rhonchi or rales.  Abdominal:     General: Abdomen is flat.     Palpations: There is no mass.     Tenderness: There is no abdominal tenderness. There is no guarding.     Hernia: No hernia is present.  Musculoskeletal:        General: Normal range of motion.     Cervical back: Neck supple.     Right lower leg: No edema.     Left lower leg: No edema.  Lymphadenopathy:     Cervical: No cervical adenopathy.  Skin:    General: Skin is warm and dry.     Coloration: Skin is pale.  Neurological:     General: No focal deficit present.     Mental Status: He is alert.  Psychiatric:        Mood and Affect: Mood normal.        Behavior: Behavior normal.    Lab Results  Component Value Date   WBC 3.6 (L) 05/08/2021   HGB 11.1 (L) 05/08/2021   HCT 34.4 (L) 05/08/2021   PLT 94.0 (L) 05/08/2021   GLUCOSE 115 (H) 04/03/2021   CHOL 110 05/17/2020   TRIG 176 (A) 05/17/2020   HDL 62 05/17/2020   LDLDIRECT 113.2 04/01/2007   LDLCALC 13 05/17/2020   ALT 15 04/01/2021   AST 42 (H) 04/01/2021   NA 143 04/03/2021   K 4.2 04/03/2021   CL 110 04/03/2021   CREATININE 1.19 04/03/2021   BUN 14 04/03/2021   CO2 26 04/03/2021   TSH 0.525 04/01/2021   PSA 0.94 05/17/2020   INR 1.0 11/26/2020   HGBA1C 5.7 05/08/2021   MICROALBUR 1.1 02/23/2018    ECHOCARDIOGRAM COMPLETE  Result Date: 04/02/2021    ECHOCARDIOGRAM REPORT   Patient Name:   Hector [name] Date of Exam: 04/02/2021 Medical Rec #:  [id number]    Height:       70.0 in Accession #:    [id number]   Weight:       175.7 lb  Date of Birth:  05/21/1961     BSA:          1.976 m Patient Age:    54 years     BP:           110/76 mmHg Patient Gender: M            HR:           101 bpm. Exam Location:  [facility] Procedure: 2D Echo, Cardiac Doppler and Color Doppler Indications:     Syncope R55  History:         Patient has prior history of Echocardiogram examinations, most                  recent 09/27/2018. Risk Factors:Diabetes. Anxiety, obstructive                  sleep apnea.  Sonographer:     Sherrie Sport RDCS (AE) Referring Phys:  Theola Sequin Diagnosing Phys: Serafina Royals MD  Sonographer Comments: Suboptimal apical window. IMPRESSIONS  1. Left ventricular ejection fraction, by estimation, is 60 to 65%. The left ventricle has normal function. The left ventricle has no regional wall motion abnormalities. Left ventricular diastolic parameters were normal.  2. Right ventricular systolic function is normal. The right ventricular size is normal.  3. The mitral valve is normal in structure. Trivial mitral valve regurgitation.  4. The aortic valve is normal in structure. Aortic valve regurgitation is trivial. FINDINGS  Left Ventricle: Left ventricular ejection fraction, by estimation, is 60 to 65%. The left ventricle has normal function. The left ventricle has no regional wall motion abnormalities. The left ventricular internal cavity size was normal in size. There is  no left ventricular hypertrophy. Left ventricular diastolic parameters were normal. Right Ventricle: The right ventricular size is normal. No increase in right ventricular wall thickness. Right ventricular systolic function is normal. Left Atrium: Left atrial size was normal in size. Right Atrium: Right atrial size was normal in size. Pericardium: There is no evidence of pericardial effusion. Mitral Valve: The mitral valve is normal in structure.  Trivial mitral valve regurgitation. Tricuspid Valve: The tricuspid valve is normal in structure. Tricuspid valve regurgitation is trivial. Aortic Valve: The aortic valve is normal in structure. Aortic valve regurgitation is trivial. Aortic valve mean gradient measures 4.0 mmHg. Aortic valve peak gradient measures 7.3 mmHg. Aortic valve area, by VTI measures 2.54 cm. Pulmonic Valve: The pulmonic valve was normal in structure. Pulmonic valve regurgitation is not visualized. Aorta: The aortic root and ascending aorta are structurally normal, with no evidence of dilitation. IAS/Shunts: No atrial level shunt detected by color flow Doppler.  LEFT VENTRICLE PLAX 2D LVIDd:         4.50 cm  Diastology LVIDs:         2.40 cm  LV e' medial:    7.07 cm/s LV PW:         1.60 cm  LV E/e' medial:  15.3 LV IVS:        0.90 cm  LV e' lateral:   10.70 cm/s LVOT diam:     2.10 cm  LV E/e' lateral: 10.1 LV SV:         68 LV SV Index:   34 LVOT Area:     3.46 cm  RIGHT VENTRICLE RV Basal diam:  3.10 cm RV S prime:     16.80 cm/s TAPSE (M-mode): 4.7 cm LEFT ATRIUM  Index       RIGHT ATRIUM           Index LA diam:        3.30 cm 1.67 cm/m  RA Area:     15.80 cm LA Vol (A2C):   63.3 ml 32.04 ml/m RA Volume:   37.30 ml  18.88 ml/m LA Vol (A4C):   30.8 ml 15.59 ml/m LA Biplane Vol: 46.2 ml 23.39 ml/m  AORTIC VALVE                   PULMONIC VALVE AV Area (Vmax):    2.36 cm    PV Vmax:        0.51 m/s AV Area (Vmean):   2.51 cm    PV Peak grad:   1.0 mmHg AV Area (VTI):     2.54 cm    RVOT Peak grad: 2 mmHg AV Vmax:           135.00 cm/s AV Vmean:          95.200 cm/s AV VTI:            0.266 m AV Peak Grad:      7.3 mmHg AV Mean Grad:      4.0 mmHg LVOT Vmax:         92.00 cm/s LVOT Vmean:        69.000 cm/s LVOT VTI:          0.195 m LVOT/AV VTI ratio: 0.73  AORTA Ao Root diam: 4.20 cm MITRAL VALVE                TRICUSPID VALVE MV Area (PHT): 3.68 cm     TR Peak grad:   15.2 mmHg MV Decel Time: 206 msec     TR Vmax:         195.00 cm/s MV E velocity: 108.00 cm/s MV A velocity: 95.10 cm/s   SHUNTS MV E/A ratio:  1.14         Systemic VTI:  0.20 m                             Systemic Diam: 2.10 cm Serafina Royals MD Electronically signed by Serafina Royals MD Signature Date/Time: 04/02/2021/1:09:49 PM    Final     Assessment & Plan:   Samrath was seen today for follow-up and anemia.  Diagnoses and all orders for this visit:  Idiopathic chronic gout of multiple sites without tophus -     allopurinol (ZYLOPRIM) 100 MG tablet; Take 1 tablet (100 mg total) by mouth daily.  Primary hypertension- His blood pressure is adequately well controlled. -     CBC with Differential/Platelet; Future -     CBC with Differential/Platelet  Hyperglycemia- His A1c is normal. -     Hemoglobin A1c; Future -     Hemoglobin A1c  Other polyneuropathy -     Ambulatory referral to Neurology  Anemia due to zinc deficiency -     Zinc; Future -     Zinc -     zinc gluconate 50 MG tablet; Take 1 tablet (50 mg total) by mouth daily.  B12 deficiency- His recent B12 level was normal. -     CBC with Differential/Platelet; Future -     Folate; Future -     Folate -     CBC with Differential/Platelet  Dietary folate deficiency anemia- I will monitor his folate  level. -     CBC with Differential/Platelet; Future -     CBC with Differential/Platelet  Anemia due to acquired thiamine deficiency- His recent thiamine level was normal. -     CBC with Differential/Platelet; Future -     Cancel: Vitamin B1; Future -     CBC with Differential/Platelet  Thrombocytopenia ([place])- I will treat the zinc deficiency. -     CBC with Differential/Platelet; Future -     Folate; Future -     Folate -     CBC with Differential/Platelet  Need for vaccination -     Pneumococcal polysaccharide vaccine 23-valent greater than or equal to 2yo subcutaneous/IM -     Tdap vaccine greater than or equal to 7yo IM  I am having [name] "Jim" start on  zinc gluconate. I am also having him maintain his escitalopram, lamoTRIgine, albuterol, E-Z Spacer, nitroGLYCERIN, traZODone, tamsulosin, oxyCODONE-acetaminophen, colchicine, omeprazole, meloxicam, folic acid, pregabalin, Fish Oil-Krill Oil (KRILL & FISH OIL BLEND PO), vitamin E, losartan, Vitamin D3, and allopurinol.  Meds ordered this encounter  Medications   allopurinol (ZYLOPRIM) 100 MG tablet    Sig: Take 1 tablet (100 mg total) by mouth daily.    Dispense:  90 tablet    Refill:  1   zinc gluconate 50 MG tablet    Sig: Take 1 tablet (50 mg total) by mouth daily.    Dispense:  90 tablet    Refill:  0      Follow-up: Return in about 3 months (around 08/08/2021).  Scarlette Calico, MD

## 2021-05-10 LAB — ZINC: Zinc: 56 ug/dL — ABNORMAL LOW (ref 60–130)

## 2021-05-10 MED ORDER — ZINC GLUCONATE 50 MG PO TABS
50.0000 mg | ORAL_TABLET | Freq: Every day | ORAL | 0 refills | Status: DC
Start: 2021-05-10 — End: 2024-01-27

## 2021-06-18 DIAGNOSIS — M5416 Radiculopathy, lumbar region: Secondary | ICD-10-CM | POA: Diagnosis not present

## 2021-06-18 DIAGNOSIS — G894 Chronic pain syndrome: Secondary | ICD-10-CM | POA: Diagnosis not present

## 2021-07-09 DIAGNOSIS — F3132 Bipolar disorder, current episode depressed, moderate: Secondary | ICD-10-CM | POA: Diagnosis not present

## 2021-07-09 DIAGNOSIS — F4310 Post-traumatic stress disorder, unspecified: Secondary | ICD-10-CM | POA: Diagnosis not present

## 2021-07-15 ENCOUNTER — Emergency Department (HOSPITAL_COMMUNITY): Payer: Federal, State, Local not specified - PPO

## 2021-07-15 ENCOUNTER — Encounter (HOSPITAL_COMMUNITY): Payer: Self-pay

## 2021-07-15 ENCOUNTER — Other Ambulatory Visit: Payer: Self-pay

## 2021-07-15 ENCOUNTER — Emergency Department (HOSPITAL_COMMUNITY)
Admission: EM | Admit: 2021-07-15 | Discharge: 2021-07-15 | Disposition: A | Discharge status: Home / Self Care | Payer: Federal, State, Local not specified - PPO | Attending: Emergency Medicine | Admitting: Emergency Medicine

## 2021-07-15 DIAGNOSIS — Z7984 Long term (current) use of oral hypoglycemic drugs: Secondary | ICD-10-CM | POA: Diagnosis not present

## 2021-07-15 DIAGNOSIS — S43015A Anterior dislocation of left humerus, initial encounter: Secondary | ICD-10-CM | POA: Insufficient documentation

## 2021-07-15 DIAGNOSIS — S4292XA Fracture of left shoulder girdle, part unspecified, initial encounter for closed fracture: Secondary | ICD-10-CM

## 2021-07-15 DIAGNOSIS — S43015D Anterior dislocation of left humerus, subsequent encounter: Secondary | ICD-10-CM | POA: Diagnosis not present

## 2021-07-15 DIAGNOSIS — S42202A Unspecified fracture of upper end of left humerus, initial encounter for closed fracture: Secondary | ICD-10-CM | POA: Diagnosis not present

## 2021-07-15 DIAGNOSIS — E119 Type 2 diabetes mellitus without complications: Secondary | ICD-10-CM | POA: Insufficient documentation

## 2021-07-15 DIAGNOSIS — Z79899 Other long term (current) drug therapy: Secondary | ICD-10-CM | POA: Diagnosis not present

## 2021-07-15 DIAGNOSIS — S42252D Displaced fracture of greater tuberosity of left humerus, subsequent encounter for fracture with routine healing: Secondary | ICD-10-CM | POA: Diagnosis not present

## 2021-07-15 DIAGNOSIS — Y9389 Activity, other specified: Secondary | ICD-10-CM | POA: Diagnosis not present

## 2021-07-15 DIAGNOSIS — S4992XA Unspecified injury of left shoulder and upper arm, initial encounter: Secondary | ICD-10-CM | POA: Diagnosis not present

## 2021-07-15 DIAGNOSIS — S43005A Unspecified dislocation of left shoulder joint, initial encounter: Secondary | ICD-10-CM

## 2021-07-15 DIAGNOSIS — M25519 Pain in unspecified shoulder: Secondary | ICD-10-CM

## 2021-07-15 DIAGNOSIS — M25512 Pain in left shoulder: Secondary | ICD-10-CM | POA: Insufficient documentation

## 2021-07-15 DIAGNOSIS — I10 Essential (primary) hypertension: Secondary | ICD-10-CM | POA: Diagnosis not present

## 2021-07-15 DIAGNOSIS — S42352A Displaced comminuted fracture of shaft of humerus, left arm, initial encounter for closed fracture: Secondary | ICD-10-CM | POA: Insufficient documentation

## 2021-07-15 MED ORDER — PROPOFOL 10 MG/ML IV BOLUS
100.0000 mg | Freq: Once | INTRAVENOUS | Status: AC
  Administered 2021-07-15: 120 mg via INTRAVENOUS
  Filled 2021-07-15: qty 20

## 2021-07-15 MED ORDER — FENTANYL CITRATE PF 50 MCG/ML IJ SOSY
50.0000 ug | PREFILLED_SYRINGE | INTRAMUSCULAR | Status: DC | PRN
  Administered 2021-07-15: 50 ug via INTRAVENOUS
  Filled 2021-07-15: qty 1

## 2021-07-15 MED ORDER — KETAMINE HCL 50 MG/5ML IJ SOSY
1.0000 mg/kg | PREFILLED_SYRINGE | Freq: Once | INTRAMUSCULAR | Status: DC
  Filled 2021-07-15: qty 10

## 2021-07-15 MED ORDER — FENTANYL CITRATE PF 50 MCG/ML IJ SOSY
100.0000 ug | PREFILLED_SYRINGE | Freq: Once | INTRAMUSCULAR | Status: AC
  Administered 2021-07-15: 50 ug via INTRAVENOUS
  Filled 2021-07-15: qty 2

## 2021-07-15 NOTE — Discharge Instructions (Addendum)
You had a broken and dislocated shoulder that was reduced in the ER.  Given that there is still a fracture, it is prudent that he follow-up with the orthopedist in 1 week.  Keep the arm in the sling until then.  We request that you call your pain specialist for further pain management if the current medications are not helping.

## 2021-07-15 NOTE — ED Notes (Signed)
Procedural sedation consent signed.  Staff in room include:  Denton Ar, RN  Louretta Shorten, NT  [facility]  MD Kathrynn Humble  MD Plunkett  Time out: 1116  Pre- Vitals: 68 HR; 100O2 ; 11 CO2; 12 RR; 135/92 BP ; 99%   Meds given by MD Plunkett  40mg  Propofol : 1117 20mg  Propofol given : 1118 20mg  propofol given: 1119  Vitals:  75HR  13RR 124/86 93%   20Mg  propofol 1120 20mg  propofol 1122 20mg  propofol 1123  Vitals:  73HR  96%  11RR  Potentially in 1125 XRAY called for confirmation 1126 Sling placed 1126  Vitals:  76HR 12 CO2 25RR 148/85BP  95%

## 2021-07-15 NOTE — ED Provider Notes (Signed)
[facility] DEPT Provider Note   CSN: [id number] Arrival date & time: 07/15/21  0847     History Chief Complaint  Patient presents with   Shoulder Injury    Hector [name] is a 60 y.o. male.  HPI    60 year old male with history of diabetes, aortic aneurysm, remote history of PE not on any blood thinners now comes in with chief complaint of fall and resultant shoulder injury.  Patient had gone on a fishing trip, fell into the boat on Friday and injured his left shoulder.  He returned from the trip yesterday and went to orthopedic urgent care, where he was found to have shoulder fracture dislocation.  Also reports a little bit of numbness over his left digit 1 and 2 and slight numbness over the left shoulder/deltoid region.  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    Anxiety    Ascending aortic aneurysm    a. 06/2018 CT: 4.1cm.   B12 nutritional deficiency    Bipolar disorder ([place])    Chest pain    a. 2007 MV: No ischemia; b. 09/2018 recurrent c/p in setting of PE; c. 09/2018 MV: No ischemia/infarct. EF 55-65%.   Closed head injury    laceration  9 12  no loc  vision change   DDD (degenerative disc disease)    Depression    Diabetes mellitus    resolved with gastric bypass   GERD (gastroesophageal reflux disease)    no meds   Gout    Headache    once per week uses Indomethicin hx of cluster headaches    History of bunionectomy of left great toe    History of concussion    History of echocardiogram    a. 09/2018 Echo: Ef 65-70%, no rwma, mild MR. Ao root 41mm (mildly dil).   History of kidney stones    History of renal stone    Seen in emergency room not urologist   Hyperlipidemia    Hypertension 02/27/2017   Neuromuscular disorder ([place])     Nonspecific pain in the lumbar region    OBESITY 12/10/2007   a. s/p gastric bypass.   OSA (obstructive sleep apnea) 12/12/2013   resolved by weight loss surgery 10/26/2009   Osteoarthritis of knee    Pain in left knee    Pancreatitis 03/21/2016   went to [place]   Perforated ulcer [facility])    Surgical repair9/24 2011   Peripheral neuropathy    PONV (postoperative nausea and vomiting)    Pulmonary embolism ([place])    a. 09/18/2018 CTA Chest: RLL segmental arterial branch small volume PE-->Eliquis.    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/2020   Osteoarthritis of knee 11/02/2018   Pulmonary embolism ([place]) 09/24/2018   Hypertension 02/27/2017   Chronic low back pain 11/24/2016   Perennial allergic rhinitis 11/26/2015   Renal  calculi    Spinal stenosis, lumbar region, 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   Lumbar back pain with radiculopathy affecting left lower extremity 05/15/2014   Medication management 05/15/2014   Insufficient treatment with nasal CPAP 04/24/2014   Sleep apnea, 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   OSA (obstructive sleep apnea) 12/12/2013   History of Roux-en-Y gastric bypass, 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   Colon cancer screening 12/28/2012   GERD (gastroesophageal reflux disease) 12/28/2012   Thrombocytopenia ([place]) 05/24/2010   GOUT 08/02/2007   B12 deficiency 04/06/2007   CARPAL TUNNEL SYNDROME 04/06/2007    Past Surgical History:  Procedure Laterality Date   ABDOMINAL EXPOSURE N/A 03/29/2020   Procedure: ABDOMINAL EXPOSURE;  Surgeon: Rosetta Posner, MD;  Location: [facility] OR;  Service:  Vascular;  Laterality: N/A;   ANTERIOR LUMBAR FUSION N/A 03/29/2020   Procedure: ANTERIOR LUMBAR FUSION (ALIF) L4-5;  Surgeon: Melina Schools, MD;  Location: [place] [facility];  Service: Orthopedics;  Laterality: N/A;  5.5 hrs Dr. Donnetta Hutching do approach Lt tap block with exparel   BACK SURGERY  2012   Dr. Dorina Hoyer disk   CARPAL TUNNEL RELEASE  2008   left-ulnar nerve decomp   carpel tunnel Left    CHOLECYSTECTOMY N/A 03/21/2016   Procedure: LAPAROSCOPIC CHOLECYSTECTOMY WITH INTRAOPERATIVE CHOLANGIOGRAM;  Surgeon: Ralene Ok, MD;  Location: [facility] ORS;  Service: General;  Laterality: N/A;   colonscopy      DEBRIDEMENT AND CLOSURE WOUND N/A 05/09/2020   Procedure: Irrigation and debridement of superficial wound closure;  Surgeon: Melina Schools, MD;  Location: [place];  Service: Orthopedics;  Laterality: N/A;  1 hr   DIAGNOSTIC LAPAROSCOPY     laparoscopic gastric bypass   EPIDURAL BLOCK INJECTION     by Dr. Nelva Bush   FOOT SURGERY     bilat for removal of extra bone    GASTRIC BYPASS  11/06/09   beginning weight 267   HARDWARE REMOVAL N/A 05/02/2016   Procedure: Removal of hardware Lumbar five-Sacral one with Metrex;  Surgeon: Kristeen Miss, MD;  Location: [facility];  Service: Neurosurgery;  Laterality: N/A;  Removal of hardware L5-S1 with Metrex   HERNIA REPAIR  9702   umbilical   HIATAL HERNIA REPAIR N/A 01/20/2013   Procedure: LAPAROSCOPIC REPAIR OF INTERNAL HERNIA;  Surgeon: Shann Medal, MD;  Location: [facility];  Service: General;  Laterality: N/A;   LUMBAR LAMINECTOMY/DECOMPRESSION MICRODISCECTOMY Left 06/07/2014   Procedure: MICRO LUMBAR DECOMPRESSION L5 - S1 ON THE LEFT/L5 FORAMINOTOMY/EXCISION SYNOVIAL CYST  1 LEVEL;  Surgeon: [name], MD;  Location: [facility];  Service: Orthopedics;  Laterality: Left;   MASS EXCISION Right 04/18/2013   Procedure: RIGHT SMALL FINGER SKIN LESION EXCISION;  Surgeon: Jolyn Nap, MD;  Location: [place];  Service: Orthopedics;  Laterality: Right;    NASAL SEPTOPLASTY W/ TURBINOPLASTY     x 2   NEPHROLITHOTOMY Right 11/03/2014   Procedure: RIGHT PERCUTANEOUS NEPHROLITHOTOMY;  Surgeon: Claybon Jabs, MD;  Location: [facility];  Service: Urology;  Laterality: Right;   SHOULDER ARTHROSCOPY  2014   rt   surgerical repair of stomach ulcer  06/08/10   per   TONSILLECTOMY     UVULOPALATOPHARYNGOPLASTY  2007   with tonsils   VASECTOMY         Family History  Problem Relation Age of Onset   Uterine cancer Mother    Hypertension Mother    Cervical cancer Mother    Heart disease Father    COPD Father    Seizures  Father    Arthritis Father    Arthritis Sister    Diabetes Sister    Colon cancer Maternal Uncle    Colon cancer Paternal Aunt    Diabetes Paternal Aunt    Heart disease Maternal Grandmother    Heart disease Maternal Grandfather    Heart disease Paternal Grandmother    Heart disease Paternal Grandfather    Diabetes Paternal Uncle     Social History   Tobacco Use   Smoking status: Never   Smokeless tobacco: Never  Vaping Use   Vaping Use: Never used  Substance Use Topics   Alcohol use: Yes    Comment: occas liquor    Drug use: No    Home Medications Prior to Admission medications   Medication Sig Start Date End Date Taking? Authorizing Provider  acetaminophen (TYLENOL) 500 MG tablet Take 1,000 mg by mouth every 6 (six) hours as needed for mild pain.   Yes [provider]  albuterol (PROVENTIL HFA;VENTOLIN HFA) 108 (90 Base) MCG/ACT inhaler Inhale 2 puffs into the lungs every 6 (six) hours as needed for wheezing or shortness of breath. 09/03/18  Yes Koberlein, Steele Berg, MD  allopurinol (ZYLOPRIM) 100 MG tablet Take 1 tablet (100 mg total) by mouth daily. 05/08/21  Yes Janith Lima, MD  celecoxib (CELEBREX) 200 MG capsule Take 200 mg by mouth daily. 05/26/21  Yes [provider]  colchicine 0.6 MG tablet TAKE ONE TABLET BY MOUTH DAILY AS NEEDED FOR GOUT Patient taking differently: Take 0.6 mg  by mouth daily as needed (gout). 08/07/20  Yes Felipa Furnace, DPM  escitalopram (LEXAPRO) 20 MG tablet Take 20 mg by mouth every morning.    Yes [provider]  Fish Oil-Krill Oil (KRILL & FISH OIL BLEND PO) Take 1 capsule by mouth daily.   Yes [provider]  folic acid (FOLVITE) 1 MG tablet Take 1 tablet (1 mg total) by mouth daily. 11/26/20  Yes Janith Lima, MD  HYDROmorphone (DILAUDID) 4 MG tablet Take 4 mg by mouth daily as needed for pain. 06/18/21  Yes [provider]  lamoTRIgine (LAMICTAL) 100 MG tablet Take 150 mg by mouth 2 (two) times daily.    Yes [provider]  losartan (COZAAR) 25 MG tablet Take 1 tablet (25 mg total) by mouth daily. Take if SBP >130 mmHg in am and take additional dose at night if SBP >130 mmHg 04/03/21 04/03/22 Yes Val Riles, MD  meloxicam (MOBIC) 15 MG tablet TAKE ONE TABLET BY MOUTH DAILY Patient taking differently: Take 15 mg by mouth daily as needed for pain. 11/07/20  Yes Felipa Furnace, DPM  nitroGLYCERIN (NITROSTAT) 0.3 MG SL tablet Place 1 tablet (0.3 mg total) under the tongue every 5 (five) minutes as needed for chest pain. 09/27/18 07/15/21 Yes Epifanio Lesches, MD  omeprazole (PRILOSEC) 40 MG capsule Take 40 mg by mouth daily. 08/03/20  Yes [provider]  oxyCODONE-acetaminophen (PERCOCET) 10-325 MG tablet Take 1 tablet by mouth every 6 (six) hours as needed for pain. Takes 1 tablet in the morning and 1 tablet at bedtime   Yes [provider]  pregabalin (LYRICA) 150 MG capsule Take 150 mg by mouth 2 (two) times daily. 03/19/21  Yes [provider]  Spacer/Aero-Holding Chambers (E-Z SPACER) inhaler Use as instructed 09/03/18  Yes Koberlein, Junell C, MD  tamsulosin (FLOMAX) 0.4 MG CAPS capsule Take 0.4 mg by mouth daily as needed (kidney stones).    Yes [provider]  traZODone (DESYREL) 100 MG tablet Take 300 mg by mouth at bedtime. 07/17/19  Yes [provider]   vitamin E 180 MG (400 UNITS) capsule Take 400 Units by mouth daily.   Yes [provider]  zinc gluconate 50 MG tablet Take 1 tablet (50 mg total) by mouth daily. 05/10/21  Yes Janith Lima, MD  enoxaparin (LOVENOX) 40 MG/0.4ML injection Inject 0.4 mLs (40 mg total) into the skin daily. 10 day supply 1 injection per day Patient not taking: No sig reported 03/29/20 10/17/20  Melina Schools, MD    Allergies    Aspirin, Cymbalta [duloxetine hcl], Nsaids, Tape, Hydrocodone, and Vancomycin  Review of Systems   Review of Systems  Constitutional:  Positive for activity change.  Respiratory:  Negative for shortness of breath.   Cardiovascular:  Negative for chest pain.  Neurological:  Positive for numbness.  Hematological:  Does not bruise/bleed easily.   Physical Exam Updated Vital Signs BP 133/74   Pulse 65   Temp 98.2 F (36.8 C) (Oral)   Resp 12   Ht 5\' 10"  (1.778 m)   Wt 83.9 kg   SpO2 100%   BMI 26.54 kg/m   Physical Exam Vitals and nursing note reviewed.  Constitutional:      Appearance: He is well-developed.  HENT:     Head: Atraumatic.  Cardiovascular:     Rate and Rhythm: Normal rate.  Pulmonary:     Effort: Pulmonary effort is normal.  Musculoskeletal:        General: Swelling, tenderness, deformity and signs of injury present.     Cervical back: Neck supple.  Skin:    General: Skin is warm.     Capillary Refill: Capillary refill takes less than 2 seconds.     Findings: Bruising present.  Neurological:     Mental Status: He is alert and oriented to person, place, and time.    ED Results / Procedures / Treatments   Labs (all labs ordered are listed, but only abnormal results are displayed) Labs Reviewed - No data to display  EKG None  Radiology DG Shoulder Left  Result Date: 07/15/2021 CLINICAL DATA:  Trauma, pain EXAM: LEFT SHOULDER - 2+ VIEW COMPARISON:  None. FINDINGS: There is comminuted fracture in the lateral aspect of proximal  humerus. There is lateral displacement of fracture fragments. There is anterior subcoracoid dislocation. IMPRESSION: Fracture dislocation left shoulder. Electronically Signed   By: Elmer Picker M.D.   On: 07/15/2021 09:47   DG Shoulder Left Port  Result Date: 07/15/2021 CLINICAL DATA:  Post reduction. EXAM: LEFT SHOULDER COMPARISON:  Left shoulder x-rays from same day. FINDINGS: Interval reduction of the anterior shoulder dislocation. Improved alignment of the acute comminuted mildly impacted greater tuberosity fracture. Joint spaces are preserved. Soft tissues are unremarkable. IMPRESSION: 1. Interval reduction of the anterior shoulder dislocation with improved alignment of the greater tuberosity fracture. Electronically Signed   By: Titus Dubin M.D.   On: 07/15/2021 11:42    Procedures Reduction of dislocation  Date/Time: 07/15/2021 11:53 AM Performed by: Varney Biles, MD Authorized by: Varney Biles, MD  Consent: Verbal consent obtained. Written consent obtained. Risks and benefits: risks, benefits and alternatives were discussed Consent given by: patient Patient understanding: patient states understanding of the procedure being performed Patient consent: the patient's understanding of the procedure matches consent given Procedure consent: procedure consent matches procedure scheduled Relevant documents: relevant documents present and verified Test results: test results available and properly labeled Site marked:  the operative site was marked Imaging studies: imaging studies available Required items: required blood products, implants, devices, and special equipment available Patient identity confirmed: arm band Time out: Immediately prior to procedure a "time out" was called to verify the correct patient, procedure, equipment, support staff and site/side marked as required. Preparation: Patient was prepped and draped in the usual sterile fashion. Local anesthesia used:  no  Anesthesia: Local anesthesia used: no Patient tolerance: patient tolerated the procedure well with no immediate complications     Medications Ordered in ED Medications  fentaNYL (SUBLIMAZE) injection 50 mcg (50 mcg Intravenous Given 07/15/21 1001)  ketamine 50 mg in normal saline 5 mL (10 mg/mL) syringe (84 mg Intravenous Not Given 07/15/21 1144)  propofol (DIPRIVAN) 10 mg/mL bolus/IV push 100 mg (120 mg Intravenous Given 07/15/21 1144)  fentaNYL (SUBLIMAZE) injection 100 mcg (50 mcg Intravenous Given 07/15/21 1116)    ED Course  I have reviewed the triage vital signs and the nursing notes.  Pertinent labs & imaging results that were available during my care of the patient were reviewed by me and considered in my medical decision making (see chart for details).  Clinical Course as of 07/15/21 1155  Mon Jul 15, 2021  1000 DG Shoulder Left Viewed x-rays.  Patient has left sided anterior dislocation with fracture of the greater tuberosity. [AN]  1044 DG Shoulder Left [MP]  1586 DG Shoulder Left Port Post reduction films are satisfactory.  Reduction successful. Patient informed. [AN]  1155 xrays were reviewed independently by me [AN]    Clinical Course User Index [AN] Varney Biles, MD [MP] Ardelle Park, Talitha Givens   MDM Rules/Calculators/A&P                           60 year old comes in a chief complaint of fall.  It appears that he had suffered fracture dislocation on Friday, x-ray results today on Monday revealed fracture dislocation.  I spoke with Dr. Alvan Dame, orthopedic surgery and also APP team member for Dr. Onnie Graham -both of them who worked with orthopedic surgery at [facility].  The recommended that we attempt to reduce the shoulder in the ED, if unsuccessful we can call him.  If successful, patient will need to be seen by Dr. Onnie Graham in a week.  Patient is having some neuropraxia like symptoms, likely secondary to peripheral nerve injury.  Otherwise vascularly  intact.  Final Clinical Impression(s) / ED Diagnoses Final diagnoses:  Dislocation of left shoulder joint, initial encounter  Shoulder fracture, left, closed, initial encounter    Rx / DC Orders ED Discharge Orders     None        Varney Biles, MD 07/15/21 1157

## 2021-07-15 NOTE — Progress Notes (Signed)
Orthopedic Tech Progress Note Patient Details:  Hector [name] 27-Oct-1960 [id number]  Ortho Devices Type of Ortho Device: Sling immobilizer Ortho Device/Splint Location: reduction Ortho Device/Splint Interventions: Application   Post Interventions Patient Tolerated: Well Instructions Provided: Care of device  Hector [name] 07/15/2021, 11:07 AM

## 2021-07-15 NOTE — ED Triage Notes (Signed)
Pt reports falling onto a boat and hurting his left shoulder. He states he went to [facility] this morning and X-ray showed dislocation and fracture.

## 2021-07-17 DIAGNOSIS — M25512 Pain in left shoulder: Secondary | ICD-10-CM | POA: Diagnosis not present

## 2021-08-01 ENCOUNTER — Other Ambulatory Visit: Payer: Self-pay

## 2021-08-01 ENCOUNTER — Encounter: Payer: Self-pay | Admitting: Neurology

## 2021-08-01 ENCOUNTER — Ambulatory Visit: Payer: Federal, State, Local not specified - PPO | Admitting: Neurology

## 2021-08-01 VITALS — BP 136/92 | HR 92 | Ht 70.0 in | Wt 165.0 lb

## 2021-08-01 DIAGNOSIS — D508 Other iron deficiency anemias: Secondary | ICD-10-CM | POA: Diagnosis not present

## 2021-08-01 DIAGNOSIS — R270 Ataxia, unspecified: Secondary | ICD-10-CM | POA: Diagnosis not present

## 2021-08-01 DIAGNOSIS — K902 Blind loop syndrome, not elsewhere classified: Secondary | ICD-10-CM | POA: Diagnosis not present

## 2021-08-01 DIAGNOSIS — R42 Dizziness and giddiness: Secondary | ICD-10-CM | POA: Insufficient documentation

## 2021-08-01 DIAGNOSIS — I951 Orthostatic hypotension: Secondary | ICD-10-CM | POA: Insufficient documentation

## 2021-08-01 DIAGNOSIS — M25512 Pain in left shoulder: Secondary | ICD-10-CM | POA: Diagnosis not present

## 2021-08-01 DIAGNOSIS — D649 Anemia, unspecified: Secondary | ICD-10-CM | POA: Insufficient documentation

## 2021-08-01 DIAGNOSIS — R55 Syncope and collapse: Secondary | ICD-10-CM

## 2021-08-01 DIAGNOSIS — G4733 Obstructive sleep apnea (adult) (pediatric): Secondary | ICD-10-CM

## 2021-08-01 NOTE — Progress Notes (Signed)
SLEEP MEDICINE CLINIC    Provider:  Larey Seat, MD  Primary Care Physician:  Janith Lima, MD [place] [address]     Referring Provider: Janith Lima, [place],  [place] [address]          Chief Complaint according to patient   Patient presents with:     New Patient (Initial Visit)           HISTORY OF PRESENT ILLNESS:  Hector [name] is a 60 y.o.  male patient seen here as a referral on 08/01/2021 from Dr Ronnald Ramp for a new consultation.  Chief concern according to patient : Pt would like to RV his sleep apnea, reports he hasn't used his cpap in a while.   "I wonder if I still have apnea, I haven't used CPAP in a while "   He has longstanding history of polyneuropathy, but has reported dizziness, and weakness. Pt has neuropathy in legs, burning and pain in joints= Pain management has been tx w pregablin. Pt reports ongoing dizziness and weakness sensation for the last year. Vision starts to whiten out, like a bright light. He has some spells, blurred vision, fogginess, lightheadedness, described as if looking through gauze. He felt he could be presyncopal, but this was never cause of a fall.    I have the pleasure of seeing Hector [name] today, a right -handed male who received the diagnosis of obstructive sleep apnea at a time when he was morbidly obese. He underwent a UPPP/palatoplasty which did not resolve his snoring, he had diabetes type 2, and according to his wife was loudly snoring independent of position. In Feb. 2011 he underwent a gastric bypass surgery after which he has reached a normal BMI and initially all symptoms of obstructive sleep apnea had resolved.  Over the last 6 months ,he has again developed snoring some nights and he developed again excessive daytime sleepiness and fatigue. He has not further lost weight, has remained at BMI 24.  He had some falls, broke some ribs and ended up with a blood clot.. He has some  spells, blurred vision, fogginess, lightheadedness, described as if looking through gauze. He felt he could be presyncopal, but this was never cause of a fall.     He has a past medical history of Abnormal LFTs, ADJ DISORDER WITH MIXED ANXIETY & DEPRESSED MOOD (08/23/2009), Anxiety, Ascending aortic aneurysm, B12 nutritional deficiency, Bipolar disorder (HCC), Chest pain, Closed head injury, DDD (degenerative disc disease), Depression, Diabetes mellitus, GERD (gastroesophageal reflux disease), Gout, Headache, History of bunionectomy of left great toe, History of concussion, History of echocardiogram, History of kidney stones, History of renal stone, Hyperlipidemia, Hypertension (02/27/2017), Neuromuscular disorder ([place]), Nonspecific pain in the lumbar region, OBESITY (12/10/2007), OSA (obstructive sleep apnea) (12/12/2013), Osteoarthritis of knee, Pain in left knee, Pancreatitis (03/21/2016), Perforated ulcer ([place]), Peripheral neuropathy, PONV (postoperative nausea and vomiting), and Pulmonary embolism ([place]).  3 weekends ago, He fell into a boat standing on a deck. Shoulder was dislocated.  Ed report: Hector [name] is a 60 y.o. male with medical history significant for bipolar, anxiety, depression, degenerative disc disease, GERD, gout, bunionectomy, history of concussion, hyperlipidemia, hypertension, history of pancreatitis, osteoarthritis, history of vasectomy,obesity, chronic back pain, transthoracic aortic aneurysm, presents emergency department for chief concerns of confusion.   He was unstable on his feet, and reports that while walking to kitchen, he felt unstable and fell on his bottom.  He endorses dizziness.  He states that at baseline he is prescribed 4 Percocets per day however today he only took 1.  He endorses left foot pain.    He reports history of concussion approximately 3 weeks ago, after falling and hitting his head a collector's (glass) cabinet, he got three stitches in the ED and was sent  home. He reports this has never happened.  MRI was  reviewed and normal. IMPRESSION: 1. No acute intracranial abnormality. 2. Increased size of cystic structure at the inferior left basal ganglia, likely a neuroglial cyst or enlarged perivascular space.     By: Ulyses Jarred M.D.   On: 04/01/2021 22:48 04-01-2021   The patient had the last sleep study 3/ 2015.   Sleep relevant medical history: leg pain with neuropathy, not RLS- Nocturia 2-4, no Sleep walking,  Tonsillectomy:yes UPPP.     Family medical /sleep history: Father with neuropathy and apnea. He is not diabetic.    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 Tobacco use, never .   ETOH use: none ,  Caffeine intake in form of Coffee( /) Soda( 1-3 / week) Tea ( 1-3/week) no energy drinks. Regular exercise: limited by back pain. .   Hobbies :technic.    Sleep habits are as follows: The patient's dinner time is between  PM. The patient goes to bed at 10 PM and continues to sleep for 8 hours, wakes for many bathroom breaks, the first time at 2 AM.   The preferred sleep position is right side, in a recliner. " Sleep chair" , with the support of 1-2 pillows. Dreams are reportedly frequent/vivid. He still snores, according to his wife.   7-8 AM is the usual rise time. The patient wakes up spontaneously. He reports not feeling refreshed or restored in AM,  Naps are taken 1-2 days a week, frequently, lasting from 40 to 60 minutes and are morerefreshing than nocturnal sleep.    Review of Systems: Out of a complete 14 system review, the patient complains of only the following symptoms, and all other reviewed systems are negative.:  Fatigue, sleepiness , snoring, fragmented sleep, nocturia, status post UPPP. Poly-neuropathy after years of DM but can also be related to roux and y surgery-   Lightheadedness, balance problems, blurring of vision, presyncope.    Orthostatic blood pressures : taken at PCP;   Many falls , 3 ED admissions this year alone, broken bone, facial lacerations.  He denies any LOC- any seizure like activity.     How likely are you to doze in the following situations: 0 = not likely, 1 = slight chance, 2 = moderate chance, 3 = high chance   Sitting and Reading? Watching Television? Sitting inactive in a public place (theater or meeting)? As a passenger in a car for an hour without a break? Lying down in the afternoon when circumstances permit? Sitting and talking to someone? Sitting quietly after lunch without alcohol? In a car, while stopped for a few minutes in traffic?   Total = 10/ 24 points   FSS endorsed at 40/ 63 points.   Social History   Socioeconomic History   Marital status: Married    Spouse name: Janine   Number of children: 2   Years of education: Associates   Highest education level: Not on file  Occupational History   Occupation: retired    Fish farm manager: [employer]  Tobacco Use   Smoking status: Never   Smokeless tobacco: Never  Vaping Use   Vaping Use: Never used  Substance and Sexual Activity   Alcohol use: Yes    Comment: occas liquor    Drug use: No   Sexual activity: Yes  Other Topics Concern   Not on file  Social History Narrative   Patient is married Probation officer) and lives at home with his wife.   Patient has two children.   Patient has a college education.   Regular exercise-no   Employed Event organiser and investigation   school travels       10 hours work     Sleep  No longer needs  cpap   Patient is right-handed.   Patient drinks one soda daily.   trainiing for [employer]      Social Determinants of Health   Financial Resource Strain: Not on file  Food Insecurity: Not on file  Transportation Needs: Not on file  Physical Activity: Not on file  Stress: Not on file  Social Connections: Not on file    Family History  Problem Relation Age of Onset    Uterine cancer Mother    Hypertension Mother    Cervical cancer Mother    Heart disease Father    COPD Father    Seizures Father    Arthritis Father    Arthritis Sister    Diabetes Sister    Colon cancer Maternal Uncle    Colon cancer Paternal Aunt    Diabetes Paternal Aunt    Heart disease Maternal Grandmother    Heart disease Maternal Grandfather    Heart disease Paternal Grandmother    Heart disease Paternal Grandfather    Diabetes Paternal Uncle     Past Medical History:  Diagnosis Date   Abnormal LFTs    improved after gastic bypass   ADJ DISORDER WITH MIXED ANXIETY & DEPRESSED MOOD 08/23/2009   Qualifier: Diagnosis of  By: [name] MD, Standley Brooking Now on lamictal and lexapro and elevil for sleep.    Anxiety    Ascending aortic aneurysm    a. 06/2018 CT: 4.1cm.   B12 nutritional deficiency    Bipolar disorder ([place])    Chest pain    a. 2007 MV: No ischemia; b. 09/2018 recurrent c/p in setting of PE; c. 09/2018 MV: No ischemia/infarct. EF 55-65%.   Closed head injury    laceration  9 12  no loc  vision change   DDD (degenerative disc disease)    Depression    Diabetes mellitus    resolved with gastric bypass   GERD (gastroesophageal reflux disease)    no meds   Gout    Headache    once per week uses Indomethicin hx of cluster headaches    History of bunionectomy of left great toe    History of concussion    History of echocardiogram    a. 09/2018 Echo: Ef 65-70%, no rwma, mild MR. Ao root 44mm (mildly dil).   History of kidney stones    History of renal stone    Seen in emergency room not urologist   Hyperlipidemia    Hypertension 02/27/2017   Neuromuscular disorder ([place])    Nonspecific pain in the lumbar region    OBESITY 12/10/2007   a. s/p gastric bypass.   OSA (obstructive sleep apnea) 12/12/2013   resolved by weight loss surgery 10/26/2009   Osteoarthritis of knee    Pain in left knee  Pancreatitis 03/21/2016   went to [facility]   Perforated ulcer [facility])     Surgical repair9/24 2011   Peripheral neuropathy    PONV (postoperative nausea and vomiting)    Pulmonary embolism ([place])    a. 09/18/2018 CTA Chest: RLL segmental arterial branch small volume PE-->Eliquis.    Past Surgical History:  Procedure Laterality Date   ABDOMINAL EXPOSURE N/A 03/29/2020   Procedure: ABDOMINAL EXPOSURE;  Surgeon: Rosetta Posner, MD;  Location: [facility] OR;  Service: Vascular;  Laterality: N/A;   ANTERIOR LUMBAR FUSION N/A 03/29/2020   Procedure: ANTERIOR LUMBAR FUSION (ALIF) L4-5;  Surgeon: Melina Schools, MD;  Location: [facility];  Service: Orthopedics;  Laterality: N/A;  5.5 hrs Dr. Donnetta Hutching do approach Lt tap block with exparel   BACK SURGERY  2012   Dr. Dorina Hoyer disk   CARPAL TUNNEL RELEASE  2008   left-ulnar nerve decomp   carpel tunnel Left    CHOLECYSTECTOMY N/A 03/21/2016   Procedure: LAPAROSCOPIC CHOLECYSTECTOMY WITH INTRAOPERATIVE CHOLANGIOGRAM;  Surgeon: Ralene Ok, MD;  Location: [facility] ORS;  Service: General;  Laterality: N/A;   colonscopy      DEBRIDEMENT AND CLOSURE WOUND N/A 05/09/2020   Procedure: Irrigation and debridement of superficial wound closure;  Surgeon: Melina Schools, MD;  Location: [facility];  Service: Orthopedics;  Laterality: N/A;  1 hr   DIAGNOSTIC LAPAROSCOPY     laparoscopic gastric bypass   EPIDURAL BLOCK INJECTION     by Dr. Nelva Bush   FOOT SURGERY     bilat for removal of extra bone    GASTRIC BYPASS  11/06/09   beginning weight 267   HARDWARE REMOVAL N/A 05/02/2016   Procedure: Removal of hardware Lumbar five-Sacral one with Metrex;  Surgeon: Kristeen Miss, MD;  Location: [facility];  Service: Neurosurgery;  Laterality: N/A;  Removal of hardware L5-S1 with Metrex   HERNIA REPAIR  2992   umbilical   HIATAL HERNIA REPAIR N/A 01/20/2013   Procedure: LAPAROSCOPIC REPAIR OF INTERNAL HERNIA;  Surgeon: Shann Medal, MD;  Location: [facility];  Service: General;  Laterality: N/A;   LUMBAR LAMINECTOMY/DECOMPRESSION MICRODISCECTOMY Left 06/07/2014    Procedure: MICRO LUMBAR DECOMPRESSION L5 - S1 ON THE LEFT/L5 FORAMINOTOMY/EXCISION SYNOVIAL CYST  1 LEVEL;  Surgeon: [name], MD;  Location: [facility];  Service: Orthopedics;  Laterality: Left;   MASS EXCISION Right 04/18/2013   Procedure: RIGHT SMALL FINGER SKIN LESION EXCISION;  Surgeon: Jolyn Nap, MD;  Location: [facility];  Service: Orthopedics;  Laterality: Right;   NASAL SEPTOPLASTY W/ TURBINOPLASTY     x 2   NEPHROLITHOTOMY Right 11/03/2014   Procedure: RIGHT PERCUTANEOUS NEPHROLITHOTOMY;  Surgeon: Claybon Jabs, MD;  Location: [facility];  Service: Urology;  Laterality: Right;   SHOULDER ARTHROSCOPY  2014   rt   surgerical repair of stomach ulcer  06/08/10   per   TONSILLECTOMY     UVULOPALATOPHARYNGOPLASTY  2007   with tonsils   VASECTOMY       Current Outpatient Medications on File Prior to Visit  Medication Sig Dispense Refill   acetaminophen (TYLENOL) 500 MG tablet Take 1,000 mg by mouth every 6 (six) hours as needed for mild pain.     albuterol (PROVENTIL HFA;VENTOLIN HFA) 108 (90 Base) MCG/ACT inhaler Inhale 2 puffs into the lungs every 6 (six) hours as needed for wheezing or shortness of breath. 1 Inhaler 0   allopurinol (ZYLOPRIM) 100 MG tablet Take 1 tablet (100 mg total) by mouth  daily. 90 tablet 1   celecoxib (CELEBREX) 200 MG capsule Take 200 mg by mouth daily.     colchicine 0.6 MG tablet TAKE ONE TABLET BY MOUTH DAILY AS NEEDED FOR GOUT 30 tablet 1   escitalopram (LEXAPRO) 20 MG tablet Take 20 mg by mouth every morning.      Fish Oil-Krill Oil (KRILL & FISH OIL BLEND PO) Take 1 capsule by mouth daily.     folic acid (FOLVITE) 1 MG tablet Take 1 tablet (1 mg total) by mouth daily. 90 tablet 1   HYDROmorphone (DILAUDID) 4 MG tablet Take 4 mg by mouth daily as needed for pain.     lamoTRIgine (LAMICTAL) 100 MG tablet Take 150 mg by mouth 2 (two) times daily.      losartan (COZAAR) 25 MG tablet Take 1 tablet (25 mg total) by mouth daily. Take if SBP  >130 mmHg in am and take additional dose at night if SBP >130 mmHg 90 tablet 3   meloxicam (MOBIC) 15 MG tablet TAKE ONE TABLET BY MOUTH DAILY (Patient taking differently: Take 15 mg by mouth daily as needed for pain.) 30 tablet 0   omeprazole (PRILOSEC) 40 MG capsule Take 40 mg by mouth daily.     oxyCODONE-acetaminophen (PERCOCET) 10-325 MG tablet Take 1 tablet by mouth every 6 (six) hours as needed for pain. Takes 1 tablet in the morning and 1 tablet at bedtime     pregabalin (LYRICA) 150 MG capsule Take 150 mg by mouth 2 (two) times daily.     Spacer/Aero-Holding Chambers (E-Z SPACER) inhaler Use as instructed 1 each 2   tamsulosin (FLOMAX) 0.4 MG CAPS capsule Take 0.4 mg by mouth daily as needed (kidney stones).      traZODone (DESYREL) 100 MG tablet Take 300 mg by mouth at bedtime.     vitamin E 180 MG (400 UNITS) capsule Take 400 Units by mouth daily.     zinc gluconate 50 MG tablet Take 1 tablet (50 mg total) by mouth daily. 90 tablet 0   nitroGLYCERIN (NITROSTAT) 0.3 MG SL tablet Place 1 tablet (0.3 mg total) under the tongue every 5 (five) minutes as needed for chest pain. 100 tablet 3   [DISCONTINUED] enoxaparin (LOVENOX) 40 MG/0.4ML injection Inject 0.4 mLs (40 mg total) into the skin daily. 10 day supply 1 injection per day (Patient not taking: No sig reported) 4 mL 0   No current facility-administered medications on file prior to visit.    Allergies  Allergen Reactions   Aspirin Other (See Comments)    perforated ulcer   Cymbalta [Duloxetine Hcl] Other (See Comments)    Over-sedating; slept over 24 hours with one dose.  Possible serotonin syndrome.   Nsaids Other (See Comments)    Perforated ulcer   Tape Rash    Surgical tape   Hydrocodone Other (See Comments)    headache   Vancomycin Rash    Red man syndrome.  Please premedicate.    Physical exam:  Today's Vitals   08/01/21 1344  BP: (!) 136/92  Pulse: 92  Weight: 165 lb (74.8 kg)  Height: 5\' 10"  (1.778 m)    Body mass index is 23.68 kg/m.   Wt Readings from Last 3 Encounters:  08/01/21 165 lb (74.8 kg)  07/15/21 185 lb (83.9 kg)  05/08/21 170 lb 12.8 oz (77.5 kg)     Ht Readings from Last 3 Encounters:  08/01/21 5\' 10"  (1.778 m)  07/15/21 5\' 10"  (1.778 m)  05/08/21  5\' 10"  (1.778 m)      General: The patient is awake, alert and appears not in acute distress. The patient is well groomed. Head: Normocephalic, atraumatic. Neck is supple.  Mallampati 2, pale !!! neck circumference:15.5 inches . Nasal airflow patent.  Dental status: poor.  Cardiovascular:  Regular rate and cardiac rhythm by pulse,  without distended neck veins. Respiratory: Lungs are clear to auscultation.  Skin:  Without evidence of ankle edema, bruising, petechiae.  Trunk: The patient's posture is erect.left arm I a sling on 08-01-2021   Neurologic exam : The patient is awake and alert, oriented to place and time.   Memory subjective described as intact.  Attention span & concentration ability appears normal.  Speech is fluent,  without  dysarthria, mild dysphonia , but no aphasia.  Mood and affect are appropriate.   Cranial nerves: no loss of smell or taste reported  Pupils are equal and briskly reactive to light. Funduscopic exam deferred.  Extraocular movements in vertical and horizontal planes were intact and without nystagmus. No Diplopia. Visual fields by finger perimetry are intact. Hearing was intact to soft voice and finger rubbing.    Facial sensation intact to fine touch.  Facial motor strength is symmetric and tongue and uvula move midline.  Neck ROM : rotation, tilt and flexion extension were normal for age and shoulder shrug was asymmetrical. - due to recent fall injury to left shoulder. Left is higher.    Motor exam:  Symmetric bulk, tone and ROM.  I did forgo ROM testing in left arm ( sling)  Normal tone without cog-wheeling, symmetric grip strength .   Sensory:  Fine touch  and vibration were  diminished at both knees and absent in ankles and feet, toes.    Coordination: Rapid alternating movements in the fingers/hands were of normal speed.  The Finger-to-nose maneuver deferred.   Gait and station: Patient could rise unassisted from a seated position, not bracing, walked without assistive device.  Stance is of normal width/ base and the patient turned with 4-5 steps.  Toe and heel walk were deferred.  The patient cannot perform a tandem gait.   Deep tendon reflexes: in the  upper and lower extremities are symmetric and intact.  He has brisk patella reflexes, unusual for a neuropathy.  Babinski response was deferred.       After spending a total time of  60 minutes: this is  face to face time  and additional 15 minute time for physical and neurologic examination, review of laboratory studies,  personal review of imaging studies, reports and results of other testing and review of referral information / records as far as provided in visit, I have established the following assessments:  1) so I believe that Mr. Jaylen Knope and meanwhile 60 year old gentleman whom I have followed in the past for obstructive sleep apnea evaluation is now presenting with a sensory dominant neuropathy that likely has autonomic impact.  He reports sometimes being orthostatic, he does have profound sensory loss in the lower extremities while having a preserved patellar reflex on both knees.  His heart rate can vary his blood pressure can vary.  I would consider this a dysautonomia.  We did a detailed gait examination today and he does need the extra compensatory step to turn but he shows no sign of weakness his gait is just wider base to make up for the lack of sensory orientation.  His grip strength was good.  The range of motion  exam is limited because of his left arm being post potentially dislocated and in a sling.  2)As to the obstructive sleep apnea I am happy to check him again he did undergo obstructive  sleep apnea related UPPP in the past, he continued to snore at times and he seems to do it again now.  He does endorse a slight above average level of sleepiness while sleeping between 6 and 8 hours each night.  So it is worthwhile looking at his possible apnea again.  3) I have no explanation for the blurred vision unless its related to low blood pressure as well, a presyncope.    My Plan is to proceed with:  1) HST 2) work up for sensory and potential dysautonomia - neuropathy due to malabsorption post bariatric surgery? Former DM?  3) anemia ? Next appt: None    0 Result Notes    important suggestion  Newer results are available. Click to view them now.   Component Ref Range & Units 4 mo ago  (04/03/21) 4 mo ago  (04/02/21) 4 mo ago  (04/01/21) 4 mo ago  (04/01/21) 5 mo ago  (02/20/21) 8 mo ago  (11/26/20) 1 yr ago  (05/08/20)  WBC 4.0 - 10.5 K/uL 2.1 Low   2.7 Low   3.9 Low   4.0  2.9 Low   3.3 Low   3.3 Low    RBC 4.22 - 5.81 MIL/uL 3.27 Low   3.25 Low   3.62 Low   3.63 Low   3.44 Low   3.69 Low  R  3.76 Low    Hemoglobin 13.0 - 17.0 g/dL 10.0 Low   10.2 Low   11.3 Low   11.4 Low   11.5 Low   13.0  12.0 Low    HCT 39.0 - 52.0 % 31.7 Low   31.7 Low   35.4 Low   35.6 Low   34.3 Low   38.4 Low   38.5 Low    MCV 80.0 - 100.0 fL 96.9  97.5  97.8  98.1  99.7  104.1 High  R  102.4 High    MCH 26.0 - 34.0 pg 30.6  31.4  31.2  31.4  33.4   31.9   MCHC 30.0 - 36.0 g/dL 31.5  32.2  31.9  32.0  33.5  33.7  31.2   RDW 11.5 - 15.5 % 14.3  14.5  14.6  14.5  13.9  15.3  14.0   Platelets 150 - 400 K/uL 55 Low   62 Low  CM  101 Low  CM  100 Low  CM  85 Low  CM  108.0 Low  R  129 Low    Comment: Immature Platelet Fraction may be      0 Result Notes Component Ref Range & Units 4 mo ago  (04/01/21) 4 mo ago  (04/01/21) 2 yr ago  (07/30/19) 4 yr ago  (11/24/16) 4 yr ago  (11/24/16) 6 yr ago  (05/09/15)  Lactic Acid, Venous 0.5 - 1.9 mmol/L 1.6  2.3 High Panic  CM  1.7 CM  0.99  4.36 High Panic   1.30 R    Comment: Performed at [facility], [address] [place], [place] [address]  Resulting Agency  [facility]         important suggestion  Newer results are available. Click to view them now.   Component  Ref Range & Units 4 mo ago 6 yr ago  Magnesium 1.7 - 2.4 mg/dL 1.5 Low   2.2      I would like to thank Janith Lima, MD [place],  [place] [address] for allowing me to meet with and to take care of this pleasant patient.    CC: I will share my notes with PCP .  Electronically signed by: Larey Seat, MD 08/01/2021 2:15 PM  [facility] Board certified by The [employer] of Sleep Medicine and Diplomate of the [employer] of Sleep Medicine. Board certified In Neurology through the [place], Fellow of the [employer] of Neurology. Medical Director of [employer].

## 2021-08-01 NOTE — Patient Instructions (Signed)
Dumping Syndrome Dumping syndrome is a combination of symptoms that occur when food passes through the stomach too quickly. This is most often caused by surgery for weight loss (bariatric) or surgery of the stomach (gastric). There are two types of dumping syndrome, and some people have both types: Early dumping syndrome causes symptoms that occur soon after eating (10-30 minutes). Late dumping syndrome causes symptoms that occur a few hours after eating (2-3 hours). When you eat and drink, your stomach needs time to digest everything. With dumping syndrome, undigested food and stomach fluids move too quickly into the small intestine, causing symptoms of early dumping syndrome. In other cases, large amounts of sugar move into the small intestine, which causes the pancreas to release too much of the hormone insulin. As a result, your blood sugar can get too low (hypoglycemia), and you may get symptoms of late dumping syndrome. What are the causes? Early dumping syndrome is caused by large amounts of undigested food and liquids passing into the upper part of your small intestine. Late dumping syndrome is caused by large amounts of sugar from your diet moving into your small intestine. What increases the risk? Certain types of stomach surgery can lead to dumping syndrome. You may be at risk for dumping syndrome if you have had: Surgery for ulcers or stomach cancer. Weight loss surgery (bariatric surgery). Surgery to treat severe heartburn (gastroesophageal reflux). What are the signs or symptoms? Early dumping syndrome Symptoms of early dumping syndrome may include: Nausea. Fullness or bloating. Cramps. Vomiting. Feeling flushed. Sweating. Feeling dizzy. A fast or irregular heartbeat (palpitations). Diarrhea. Headache. Late dumping syndrome Symptoms of late dumping syndrome may include: Dizziness. Weakness. Sweating. Palpitations. Hunger. Confusion. How is this diagnosed? This  condition may be diagnosed based on: Your symptoms and medical history. A physical exam. Tests, such as: A modified glucose tolerance test. This blood test measures insulin secretion after you have a certain type of sugary (glucose) drink. Gastric emptying test. This test involves eating a bland meal that includes a material that shows up easily on an X-ray. After eating the meal, you have X-rays to find out how long it takes the stomach to empty. Upper endoscopy. In this exam, a health care provider puts a flexible telescope (endoscope) through your mouth and throat to check the inside of your stomach. How is this treated? For most people, this condition can be managed with diet changes. You may need to change your diet, nutrition, and eating habits. Working with a dietitian can be helpful. Other treatments may include: Medicine that slows down stomach emptying and reduces insulin secretion. This medicine may be given by injection. Medicine that slows down the digestion of sugar. This medicine is usually used to treat type 2 diabetes. Sometimes it can also relieve symptoms of late dumping syndrome. Surgery. For severe cases of dumping syndrome, reconstructive stomach surgery may be needed if other treatments are not working. Follow these instructions at home: Eating strategies Avoid drinking liquids for 30 minutes before meals. Avoid drinking liquids for at least 30 minutes after meals. Lie down for 15-30 minutes after meals. This will help to prevent light-headedness and will slow down the emptying of your stomach. Eat smaller portions of food. Cut food into small pieces and chew thoroughly before you swallow. Work with a Microbiologist to change your diet and maintain good nutrition. What to eat and drink  Try to make sure that at least half of your daily servings of grains come from whole grains.  These foods are high in fiber, which helps to slow down digestion. Eat more complex carbohydrates,  such as whole grains, vegetables, and beans. Avoid eating simple carbohydrates and foods that have added sugar, such as fruit juices, white bread, pastries, and sweets. Foods that are high in sugar are especially likely to bring about symptoms. With each meal and snack, eat one food that is high in protein. Avoid dairy products if they aggravate your symptoms. Try lactose-free dairy products instead. Avoid drinking alcohol. Take a fiber supplement if recommended by your health care provider. Increase the thickness of foods by adding plant-based thickening agents, such as pectin. Avoid eating very hot or very cold foods. They may aggravate your symptoms. General instructions Take over-the-counter and prescription medicines only as told by your health care provider. Keep all follow-up visits as told by your health care provider. This is important. Contact a health care provider if you: Notice that your symptoms change or get worse. Find that diet changes do not help your symptoms. Have trouble maintaining a healthy weight. Get help right away if you: Become dehydrated. Have severe dizziness. Notice that your heartbeat becomes very irregular. Summary Dumping syndrome is a combination of symptoms that occur when food passes through the stomach too quickly. Symptoms may include nausea, vomiting, diarrhea, dizziness, weakness, or hunger. This condition is often treated with dietary changes and medicines. Work with a Microbiologist to change your diet and maintain good nutrition. This information is not intended to replace advice given to you by your health care provider. Make sure you discuss any questions you have with your health care provider. Document Revised: 08/02/2020 Document Reviewed: 08/02/2020 Elsevier Patient Education  2022 [employer].

## 2021-08-02 LAB — IRON,TIBC AND FERRITIN PANEL
Ferritin: 51 ng/mL (ref 30–400)
Iron Saturation: 17 % (ref 15–55)
Iron: 74 ug/dL (ref 38–169)
Total Iron Binding Capacity: 442 ug/dL (ref 250–450)
UIBC: 368 ug/dL — ABNORMAL HIGH (ref 111–343)

## 2021-08-02 LAB — ELECTROLYTE PANEL
CO2: 26 mmol/L (ref 20–29)
Chloride: 98 mmol/L (ref 96–106)
Potassium: 4.3 mmol/L (ref 3.5–5.2)
Sodium: 138 mmol/L (ref 134–144)

## 2021-08-05 ENCOUNTER — Encounter: Payer: Self-pay | Admitting: Neurology

## 2021-08-05 NOTE — Progress Notes (Signed)
Normal ferritin

## 2021-08-05 NOTE — Progress Notes (Signed)
Decreasing trend in TIB and Iron saturation.

## 2021-08-07 NOTE — Telephone Encounter (Signed)
Called the pt and reviewed the lab results with him. Pt verbalized understanding. Pt had no questions at this time but was encouraged to call back if questions arise.

## 2021-08-28 DIAGNOSIS — S43015D Anterior dislocation of left humerus, subsequent encounter: Secondary | ICD-10-CM | POA: Diagnosis not present

## 2021-08-28 DIAGNOSIS — M25512 Pain in left shoulder: Secondary | ICD-10-CM | POA: Diagnosis not present

## 2021-09-12 ENCOUNTER — Telehealth: Payer: Self-pay | Admitting: Neurology

## 2021-09-12 NOTE — Telephone Encounter (Signed)
LVM for pt to call me back to schedule sleep study  

## 2021-09-17 DIAGNOSIS — M25511 Pain in right shoulder: Secondary | ICD-10-CM | POA: Diagnosis not present

## 2021-09-17 DIAGNOSIS — M5416 Radiculopathy, lumbar region: Secondary | ICD-10-CM | POA: Diagnosis not present

## 2021-09-25 ENCOUNTER — Ambulatory Visit: Payer: Federal, State, Local not specified - PPO | Admitting: Neurology

## 2021-09-25 DIAGNOSIS — G4733 Obstructive sleep apnea (adult) (pediatric): Secondary | ICD-10-CM

## 2021-09-25 DIAGNOSIS — K902 Blind loop syndrome, not elsewhere classified: Secondary | ICD-10-CM

## 2021-09-25 DIAGNOSIS — R42 Dizziness and giddiness: Secondary | ICD-10-CM

## 2021-09-26 ENCOUNTER — Ambulatory Visit (INDEPENDENT_AMBULATORY_CARE_PROVIDER_SITE_OTHER): Payer: Federal, State, Local not specified - PPO | Admitting: Neurology

## 2021-09-26 ENCOUNTER — Ambulatory Visit: Payer: Federal, State, Local not specified - PPO | Admitting: Neurology

## 2021-09-26 DIAGNOSIS — D508 Other iron deficiency anemias: Secondary | ICD-10-CM

## 2021-09-26 DIAGNOSIS — I951 Orthostatic hypotension: Secondary | ICD-10-CM

## 2021-09-26 DIAGNOSIS — G608 Other hereditary and idiopathic neuropathies: Secondary | ICD-10-CM

## 2021-09-26 DIAGNOSIS — M5416 Radiculopathy, lumbar region: Secondary | ICD-10-CM

## 2021-09-26 DIAGNOSIS — R42 Dizziness and giddiness: Secondary | ICD-10-CM

## 2021-09-26 DIAGNOSIS — K902 Blind loop syndrome, not elsewhere classified: Secondary | ICD-10-CM

## 2021-09-26 DIAGNOSIS — R200 Anesthesia of skin: Secondary | ICD-10-CM

## 2021-09-26 DIAGNOSIS — G4733 Obstructive sleep apnea (adult) (pediatric): Secondary | ICD-10-CM

## 2021-09-26 DIAGNOSIS — R270 Ataxia, unspecified: Secondary | ICD-10-CM

## 2021-09-26 DIAGNOSIS — R202 Paresthesia of skin: Secondary | ICD-10-CM

## 2021-09-26 NOTE — Progress Notes (Signed)
History: Patient is here today, he has a long history of bilateral foot burning tingling and numbness and multiple low back surgeries.  He has burning in the feet that can be severe.  He has a history of gastric bypass that could have caused vitamin deficiencies and also a history of diabetes in the past.    - I explained results of testing that showed a length-dependent axonal mild neuropathy and answered all questions - I did also explain to him that diabetes is likely the most common cause of neuropathy in the feet.  And even though ectrodiagnostically his large fiber neuropathy symptoms are mild, he could have an underlying small fiber neuropathy as well that can be extremely painful.   - I also explained that the test showed he has remote lumbar radiculopathy, on EMG there were no acute ongoing denervation to suggest ongoing or new radiculopathy however remote radiculopathy can cause permanent damage to the nerves and leg weakness and I do believe he likely has remote radiculopathy along with peripheral polyneuropathy contributing both to his symptoms.   - At this point I recommend that Dr. Brett Fairy check for vitamin deficiencies such as B12 deficiency if not already done, he can follow back up with his bariatric surgeon for follow-up on other vitamin deficiencies that may be ongoing that he may be needed to be treated for. - Please see emg/ncs procedure for details  I spent 30 minutes of face-to-face and non-face-to-face time with patient on the  1. Sensory mild distal peripheral neuropathy   2. remote Lumbar radiculopathy    diagnosis.  This included previsit chart review, lab review, study review, order entry, electronic health record documentation, patient education on the different diagnostic and therapeutic options, counseling and coordination of care, risks and benefits of management, compliance, or risk factor reduction. This does not include time spent on emg/ncs.

## 2021-10-01 DIAGNOSIS — G608 Other hereditary and idiopathic neuropathies: Secondary | ICD-10-CM | POA: Insufficient documentation

## 2021-10-01 DIAGNOSIS — M5416 Radiculopathy, lumbar region: Secondary | ICD-10-CM

## 2021-10-01 HISTORY — DX: Radiculopathy, lumbar region: M54.16

## 2021-10-03 NOTE — Progress Notes (Signed)
Full Name: Hector [name] Gender: Male MRN #: [id number] Date of Birth: Apr 15, 1961    Visit Date: 09/26/2021 08:29 Age: 61 Years Examining Physician: Sarina Ill, MD  Referring Physician: Larey Seat, MD Height: 5 feet 10 inch, 165lbs Patient History: bilateral foot burning tingling and numbness and multiple low back surgeries  Summary: Nerve conduction studies were performed in the bilateral lower extremities.  The left tibial motor nerve showed reduced amplitude (2.4 mV, normal greater than 4).  The right tibial motor nerve showed reduced amplitude (3.4 mV, normal greater than 4).  The left superficial peroneal sensory nerve showed reduced amplitude (4 V, normal greater than 6).  The right superficial peroneal sensory nerve showed reduced amplitude (3 V, normal greater than 6).  The left tibial F wave showed delayed latency (59.3 ms, normal less than 56).  The right tibial F wave showed delayed latency (58.1 ms, normal less than 56).All remaining nerves (as indicated in the following tables) were within normal limits. EMG needle study was performed in the left lower extremity.  The left gastrocnemius muscle and left extensor hallucis longus muscles showed diminished motor unit recruitment.  Patient could not tolerate EMG needle study of the left abductor hallucis muscle.  EMG needle study could not be performed on the lumbar paraspinals as those are unreliable after surgery.All remaining muscles (as indicated in the following tables) were within normal limits.    Conclusion: 1.  There is electrophysiologic evidence of mild axonal sensory-predominant distal polyneuropathy. 2.  There is also concomitant remote L5, S1 lumbar radiculopathy but no acute/ongoing denervation to suggest currently active nerve impingement. 3.  Please see note dated same day for further explanation of these findings.  ------------------------------- Sarina Ill, M.D.  [facility] [address], [place], [place] [address] Tel: [phone] Fax: [phone]  Verbal informed consent was obtained from the patient, patient was informed of potential risk of procedure, including bruising, bleeding, hematoma formation, infection, muscle weakness, muscle pain, numbness, among others.        [facility]    Nerve / Sites Muscle Latency Ref. Amplitude Ref. Rel Amp Segments Distance Velocity Ref. Area    ms ms mV mV %  cm m/s m/s mVms  L Peroneal - EDB     Ankle EDB 3.9 ?6.5 3.5 ?2.0 100 Ankle - EDB 9   11.0     Fib head EDB 10.6  3.2  91.7 Fib head - Ankle 30 44 ?44 10.3     Pop fossa EDB 12.9  3.0  95.6 Pop fossa - Fib head 10 44 ?44 11.4         Pop fossa - Ankle      R Peroneal - EDB     Ankle EDB 4.0 ?6.5 4.7 ?2.0 100 Ankle - EDB 9   15.3     Fib head EDB 10.8  3.7  79.1 Fib head - Ankle 30 44 ?44 13.7     Pop fossa EDB 13.1  3.9  105 Pop fossa - Fib head 10 44 ?44 14.8         Pop fossa - Ankle      L Tibial - AH     Ankle AH 2.9 ?5.8 2.4 ?4.0 100 Ankle - AH 9   4.3     Pop fossa AH 13.2  1.5  59.8 Pop fossa - Ankle 42 41 ?41 1.9  R Tibial - AH  Ankle AH 3.7 ?5.8 3.4 ?4.0 100 Ankle - AH 9   7.1     Pop fossa AH 14.0  2.8  82.9 Pop fossa - Ankle 42 41 ?41 7.9              SNC    Nerve / Sites Rec. Site Peak Lat Ref.  Amp Ref. Segments Distance    ms ms V V  cm  L Sural - Ankle (Calf)     Calf Ankle 3.0 ?4.4 6 ?6 Calf - Ankle 14  R Sural - Ankle (Calf)     Calf Ankle 3.7 ?4.4 6 ?6 Calf - Ankle 14  L Superficial peroneal - Ankle     Lat leg Ankle 3.6 ?4.4 4 ?6 Lat leg - Ankle 14  R Superficial peroneal - Ankle     Lat leg Ankle 3.5 ?4.4 3 ?6 Lat leg - Ankle 14             F  Wave    Nerve F Lat Ref.   ms ms  L Tibial - AH 59.3 ?56.0  R Tibial - AH 58.1 ?56.0         EMG Summary Table    Spontaneous MUAP Recruitment  Muscle IA Fib PSW Fasc Other Amp Dur. Poly Pattern  L. Vastus medialis Normal None None None _______ Normal Normal Normal Normal  L.  Tibialis anterior Normal None None None _______ Normal Normal Normal Normal  L. Gastrocnemius (Medial head) Normal None None None _______ Normal Normal Normal Reduced  L. Extensor hallucis longus Normal None None None _______ Normal Normal Normal Reduced  L. Abductor hallucis Normal    _______      L. Biceps femoris (long head) Normal None None None _______ Normal Normal Normal Normal  L. Gluteus maximus Normal None None None _______ Normal Normal Normal Normal  L. Gluteus medius Normal None None None _______ Normal Normal Normal Normal

## 2021-10-03 NOTE — Progress Notes (Signed)
Conclusion: 1.  There is electrophysiologic evidence of mild axonal sensory-predominant distal polyneuropathy. 2.  There is also concomitant remote L5, S1 lumbar radiculopathy but no acute/ongoing denervation to suggest currently active nerve impingement. 3.  Please see note dated same day for further explanation of these findings.Patient could not tolerate EMG needle study of the left abductor hallucis muscle.   ------------------------------- Sarina Ill, M.D.

## 2021-10-03 NOTE — Procedures (Signed)
Full Name: Hector [name] Gender: Male MRN #: [id number] Date of Birth: Dec 13, 1960    Visit Date: 09/26/2021 08:29 Age: 61 Years Examining Physician: Sarina Ill, MD  Referring Physician: Larey Seat, MD Height: 5 feet 10 inch, 165lbs Patient History: bilateral foot burning tingling and numbness and multiple low back surgeries  Summary: Nerve conduction studies were performed in the bilateral lower extremities.  The left tibial motor nerve showed reduced amplitude (2.4 mV, normal greater than 4).  The right tibial motor nerve showed reduced amplitude (3.4 mV, normal greater than 4).  The left superficial peroneal sensory nerve showed reduced amplitude (4 V, normal greater than 6).  The right superficial peroneal sensory nerve showed reduced amplitude (3 V, normal greater than 6).  The left tibial F wave showed delayed latency (59.3 ms, normal less than 56).  The right tibial F wave showed delayed latency (58.1 ms, normal less than 56).All remaining nerves (as indicated in the following tables) were within normal limits. EMG needle study was performed in the left lower extremity.  The left gastrocnemius muscle and left extensor hallucis longus muscles showed diminished motor unit recruitment.  Patient could not tolerate EMG needle study of the left abductor hallucis muscle.  EMG needle study could not be performed on the lumbar paraspinals as those are unreliable after surgery.All remaining muscles (as indicated in the following tables) were within normal limits.    Conclusion: 1.  There is electrophysiologic evidence of mild axonal sensory-predominant distal polyneuropathy. 2.  There is also concomitant remote L5, S1 lumbar radiculopathy but no acute/ongoing denervation to suggest currently active nerve impingement. 3.  Please see note dated same day for further explanation of these findings.  ------------------------------- Sarina Ill, M.D.  [facility] [address] [place], [place] [address] Tel: [phone] Fax: [phone]  Verbal informed consent was obtained from the patient, patient was informed of potential risk of procedure, including bruising, bleeding, hematoma formation, infection, muscle weakness, muscle pain, numbness, among others.        [place]    Nerve / Sites Muscle Latency Ref. Amplitude Ref. Rel Amp Segments Distance Velocity Ref. Area    ms ms mV mV %  cm m/s m/s mVms  L Peroneal - EDB     Ankle EDB 3.9 ?6.5 3.5 ?2.0 100 Ankle - EDB 9   11.0     Fib head EDB 10.6  3.2  91.7 Fib head - Ankle 30 44 ?44 10.3     Pop fossa EDB 12.9  3.0  95.6 Pop fossa - Fib head 10 44 ?44 11.4         Pop fossa - Ankle      R Peroneal - EDB     Ankle EDB 4.0 ?6.5 4.7 ?2.0 100 Ankle - EDB 9   15.3     Fib head EDB 10.8  3.7  79.1 Fib head - Ankle 30 44 ?44 13.7     Pop fossa EDB 13.1  3.9  105 Pop fossa - Fib head 10 44 ?44 14.8         Pop fossa - Ankle      L Tibial - AH     Ankle AH 2.9 ?5.8 2.4 ?4.0 100 Ankle - AH 9   4.3     Pop fossa AH 13.2  1.5  59.8 Pop fossa - Ankle 42 41 ?41 1.9  R Tibial - AH  Ankle AH 3.7 ?5.8 3.4 ?4.0 100 Ankle - AH 9   7.1     Pop fossa AH 14.0  2.8  82.9 Pop fossa - Ankle 42 41 ?41 7.9              SNC    Nerve / Sites Rec. Site Peak Lat Ref.  Amp Ref. Segments Distance    ms ms V V  cm  L Sural - Ankle (Calf)     Calf Ankle 3.0 ?4.4 6 ?6 Calf - Ankle 14  R Sural - Ankle (Calf)     Calf Ankle 3.7 ?4.4 6 ?6 Calf - Ankle 14  L Superficial peroneal - Ankle     Lat leg Ankle 3.6 ?4.4 4 ?6 Lat leg - Ankle 14  R Superficial peroneal - Ankle     Lat leg Ankle 3.5 ?4.4 3 ?6 Lat leg - Ankle 14             F  Wave    Nerve F Lat Ref.   ms ms  L Tibial - AH 59.3 ?56.0  R Tibial - AH 58.1 ?56.0         EMG Summary Table    Spontaneous MUAP Recruitment  Muscle IA Fib PSW Fasc Other Amp Dur. Poly Pattern  L. Vastus medialis Normal None None None _______ Normal Normal Normal Normal  L.  Tibialis anterior Normal None None None _______ Normal Normal Normal Normal  L. Gastrocnemius (Medial head) Normal None None None _______ Normal Normal Normal Reduced  L. Extensor hallucis longus Normal None None None _______ Normal Normal Normal Reduced  L. Abductor hallucis Normal    _______   4+   L. Biceps femoris (long head) Normal None None None _______ Normal Normal Normal Normal  L. Gluteus maximus Normal None None None _______ Normal Normal Normal Normal  L. Gluteus medius Normal None None None _______ Normal Normal Normal Normal

## 2021-10-05 ENCOUNTER — Telehealth: Payer: Self-pay | Admitting: Neurology

## 2021-10-05 NOTE — Telephone Encounter (Signed)
° ° °  NCV/ EMG ( limited) 09-26-2021  Dear Dr. Ronnald Ramp,   The results of the above named test suggest a sensory neuropathy can be related to malabsorption, mineral and vitamin deficiencies, endocrine disorders. Consider referral for tile table testing.  Larey Seat , MD

## 2021-10-07 ENCOUNTER — Ambulatory Visit (INDEPENDENT_AMBULATORY_CARE_PROVIDER_SITE_OTHER): Payer: Federal, State, Local not specified - PPO | Admitting: Neurology

## 2021-10-07 DIAGNOSIS — K902 Blind loop syndrome, not elsewhere classified: Secondary | ICD-10-CM

## 2021-10-07 DIAGNOSIS — G4733 Obstructive sleep apnea (adult) (pediatric): Secondary | ICD-10-CM | POA: Diagnosis not present

## 2021-10-07 DIAGNOSIS — R42 Dizziness and giddiness: Secondary | ICD-10-CM

## 2021-10-08 DIAGNOSIS — F3132 Bipolar disorder, current episode depressed, moderate: Secondary | ICD-10-CM | POA: Diagnosis not present

## 2021-10-08 DIAGNOSIS — F4310 Post-traumatic stress disorder, unspecified: Secondary | ICD-10-CM | POA: Diagnosis not present

## 2021-10-08 DIAGNOSIS — M5416 Radiculopathy, lumbar region: Secondary | ICD-10-CM | POA: Diagnosis not present

## 2021-10-09 ENCOUNTER — Telehealth: Payer: Self-pay | Admitting: Neurology

## 2021-10-09 DIAGNOSIS — D508 Other iron deficiency anemias: Secondary | ICD-10-CM

## 2021-10-09 DIAGNOSIS — G608 Other hereditary and idiopathic neuropathies: Secondary | ICD-10-CM

## 2021-10-09 DIAGNOSIS — K902 Blind loop syndrome, not elsewhere classified: Secondary | ICD-10-CM

## 2021-10-09 DIAGNOSIS — S43015D Anterior dislocation of left humerus, subsequent encounter: Secondary | ICD-10-CM | POA: Diagnosis not present

## 2021-10-09 DIAGNOSIS — M25512 Pain in left shoulder: Secondary | ICD-10-CM | POA: Diagnosis not present

## 2021-10-09 NOTE — Telephone Encounter (Signed)
-----   Message from Larey Seat, MD sent at 10/03/2021  5:36 PM EST ----- Conclusion: 1.  There is electrophysiologic evidence of mild axonal sensory-predominant distal polyneuropathy. 2.  There is also concomitant remote L5, S1 lumbar radiculopathy but no acute/ongoing denervation to suggest currently active nerve impingement. 3.  Please see note dated same day for further explanation of these findings.Patient could not tolerate EMG needle study of the left abductor hallucis muscle.   ------------------------------- Sarina Ill, M.D.

## 2021-10-09 NOTE — Progress Notes (Signed)
[facility] TEST REPORT ( by [facility])   STUDY DATE: 09-28-2021    ORDERING CLINICIAN: Larey Seat, MD  REFERRING CLINICIAN:    CLINICAL INFORMATION/HISTORY: Hector [name] is a 61 y.o.  male patient seen on referral on 08/01/2021 from Dr Ronnald Ramp for a new SLEEP consultation.  Chief concern according to patient : Pt would like to RV his sleep apnea, reports he hasn't used his cpap in a while. "I wonder if I still have apnea, I haven't used CPAP in a while "  "I was heavier when I was diagnosed" . I have the pleasure of seeing Hector [name] , a right -handed male who received the diagnosis of obstructive sleep apnea at a time when he was morbidly obese. He underwent a UPPP/palatoplasty which did not resolve his snoring, he had diabetes type 2, and according to his wife was loudly snoring independent of position. In Feb. 2011, he underwent a gastric bypass surgery after which he has reached a normal BMI and initially all symptoms of obstructive sleep apnea had resolved.  Over the last 6 months ,he has again developed snoring some nights and he developed again excessive daytime sleepiness and fatigue. He has not further lost weight, has remained at BMI 24.  He had some falls, broke some ribs and ended up with a blood clot.. He has some spells, blurred vision, fogginess, lightheadedness, described as if looking through gauze. He felt he could be presyncopal, but this was never cause of a fall.      Epworth sleepiness score: 10/24.   BMI: 23.7 kg/m   Neck Circumference: 16"   FINDINGS:   Sleep Summary:   Total Recording Time (hours, min): 7 hours and 40 minutes of which total sleep time was calculated to be 6 hours and 37 minutes.  The patient had 17.6% REM sleep .                                       Respiratory Indices:   Calculated pAHI (per hour): 73.6/h                           REM pAHI:    24.3/h                                              NREM pAHI: 84.4/h                            Positional AHI was 77/h in supine sleep 93/h in prone sleep, 108/h in right-sided sleep, 41/h and left-sided sleep.   Snoring was average with a mean volume of 41 dB and accompanied 27% of total sleep time                                                Oxygen Saturation Statistics:     O2 Saturation Range (%):    Oxygen saturation varied between a nadir at 83% and a maximum of 99% with a  mean saturation at 94%                                   O2 Saturation (minutes) <89%:     2.9 minutes      Pulse Rate Statistics:   Pulse Range: Heart rate varied between 51 and 99 bpm with a mean heart rate of 66 bpm.               IMPRESSION:  This HST confirms the presence of very severe apnea that was remarkably accentuated in non-REM REM sleep.  This pattern is more indicative of central apnea to be present.  There was no significant hypoxemia noted and snoring loudness was average.   RECOMMENDATION: return for attended sleep study to address possible complex or central sleep apnea.  CPAP treatment can actually worsen central apnea.  The overall apnea degree is too severe as to leave the patient untreated.    INTERPRETING PHYSICIAN:   Larey Seat, MD   Medical Director of [facility].

## 2021-10-09 NOTE — Telephone Encounter (Signed)
Called the patient and reviewed the NCV/EMG results. Dr Jaynee Eagles has reviewed the results with the pt. Dr Dohmeier recommended pt have his vitamin b 12 levels checked. I advised I would place the order for him to complete this lab. Reviewed the hours of when our [facility] is open. Pt completed the HST on 1/23. Advised that I so see where the results have been uploaded but Dr Brett Fairy still has to review them. Pt verbalized understanding.

## 2021-10-09 NOTE — Procedures (Signed)
[facility] TEST REPORT ( by Watch PAT)   STUDY DATE: 09-28-2021    ORDERING CLINICIAN: Larey Seat, MD  REFERRING CLINICIAN:    CLINICAL INFORMATION/HISTORY: Hector [name] is a 60 y.o.  male patient seen on referral on 08/01/2021 from Dr Ronnald Ramp for a new SLEEP consultation.  Chief concern according to patient : Pt would like to RV his sleep apnea, reports he hasn't used his cpap in a while. "I wonder if I still have apnea, I haven't used CPAP in a while "  "I was heavier when I was diagnosed" . I have the pleasure of seeing Hector [name] , a right -handed male who received the diagnosis of obstructive sleep apnea at a time when he was morbidly obese. He underwent a UPPP/palatoplasty which did not resolve his snoring, he had diabetes type 2, and according to his wife was loudly snoring independent of position. In Feb. 2011, he underwent a gastric bypass surgery after which he has reached a normal BMI and initially all symptoms of obstructive sleep apnea had resolved.  Over the last 6 months ,he has again developed snoring some nights and he developed again excessive daytime sleepiness and fatigue. He has not further lost weight, has remained at BMI 24.  He had some falls, broke some ribs and ended up with a blood clot.. He has some spells, blurred vision, fogginess, lightheadedness, described as if looking through gauze. He felt he could be presyncopal, but this was never cause of a fall.      Epworth sleepiness score: 10/24.   BMI: 23.7 kg/m   Neck Circumference: 16"   FINDINGS:   Sleep Summary:   Total Recording Time (hours, min): 7 hours and 40 minutes of which total sleep time was calculated to be 6 hours and 37 minutes.  The patient had 17.6% REM sleep .                                       Respiratory Indices:   Calculated pAHI (per hour): 73.6/h                           REM pAHI:    24.3/h                                             NREM  pAHI: 84.4/h                            Positional AHI was 77/h in supine sleep 93/h in prone sleep, 108/h in right-sided sleep, 41/h and left-sided sleep.   Snoring was average with a mean volume of 41 dB and accompanied 27% of total sleep time                                                Oxygen Saturation Statistics:     O2 Saturation Range (%):    Oxygen saturation varied between a nadir at 83% and a maximum of 99% with a mean saturation at  94%                                   O2 Saturation (minutes) <89%:     2.9 minutes      Pulse Rate Statistics:   Pulse Range: Heart rate varied between 51 and 99 bpm with a mean heart rate of 66 bpm.               IMPRESSION:  This HST confirms the presence of very severe apnea that was remarkably accentuated in non-REM REM sleep.  This pattern is more indicative of central apnea to be present.  There was no significant hypoxemia noted and snoring loudness was average.   RECOMMENDATION: return for attended sleep study to address possible complex or central sleep apnea.  CPAP treatment can actually worsen central apnea.  The overall apnea degree is too severe as to leave the patient untreated.    INTERPRETING PHYSICIAN:   Larey Seat, MD   Medical Director of [facility] at [facility].

## 2021-10-10 ENCOUNTER — Telehealth: Payer: Self-pay | Admitting: Neurology

## 2021-10-10 NOTE — Telephone Encounter (Signed)
-----   Message from Larey Seat, MD sent at 10/09/2021  6:08 PM EST ----- Pulse Range: Heart rate varied between 51 and 99 bpm with a mean heart rate of 66 bpm.  IMPRESSION:  This HST confirms the presence of very severe apnea that was remarkably accentuated in non-REM REM sleep.  This pattern is more indicative of central apnea to be present.  There was no significant hypoxemia noted and snoring loudness was average.  RECOMMENDATION: Return for attended sleep study to address possible complex or central sleep apnea.   CPAP treatment could worsen central apnea.   The overall apnea degree is too severe as to leave the patient untreated.   INTERPRETING PHYSICIAN:   Larey Seat, MD   Medical Director of [facility] at [facility].

## 2021-10-10 NOTE — Telephone Encounter (Signed)
-----   Message from Larey Seat, MD sent at 10/09/2021  6:08 PM EST ----- Pulse Range: Heart rate varied between 51 and 99 bpm with a mean heart rate of 66 bpm.  IMPRESSION:  This HST confirms the presence of very severe apnea that was remarkably accentuated in non-REM REM sleep.  This pattern is more indicative of central apnea to be present.  There was no significant hypoxemia noted and snoring loudness was average.  RECOMMENDATION: Return for attended sleep study to address possible complex or central sleep apnea.   CPAP treatment could worsen central apnea.   The overall apnea degree is too severe as to leave the patient untreated.   INTERPRETING PHYSICIAN:   Larey Seat, MD   Medical Director of [facility] at [facility].

## 2021-10-10 NOTE — Telephone Encounter (Signed)
I called pt. I advised pt that Dr. Brett Fairy reviewed their sleep study results and found that has severe sleep apnea and recommends that pt be treated with a cpap. Dr. Brett Fairy recommends that pt return for a repeat sleep study in order to properly titrate the cpap and ensure a good mask fit. Pt is agreeable to returning for a titration study. I advised pt that our sleep lab will file with pt's insurance and call pt to schedule the sleep study when we hear back from the pt's insurance regarding coverage of this sleep study. Pt verbalized understanding of results. Pt had no questions at this time but was encouraged to call back if questions arise.  Pt is attempting to pursue sleep apnea as a disability through the [place] and will need a letter for the [place]. Advised the patient to send a message to [place] through [employer] advising what is needed for the [place] letter.

## 2021-10-16 NOTE — Telephone Encounter (Signed)
Pt stated that he would follow up with you after he see Dr. Brett Fairy. Will call back to schedule.

## 2021-10-29 ENCOUNTER — Telehealth: Payer: Self-pay

## 2021-10-29 NOTE — Telephone Encounter (Signed)
Pt. Would like a call back from RN to discuss blood work that he needs done as well as a medication question he has

## 2021-10-29 NOTE — Telephone Encounter (Signed)
Called pt back. He forgot about getting labs completed that were ordered last month. He will try and come this week to complete. He has had some difficulty sleeping. He will bring trazodone to take night of sleep study on 11/04/21.  Nothing further needed.

## 2021-11-04 ENCOUNTER — Ambulatory Visit (INDEPENDENT_AMBULATORY_CARE_PROVIDER_SITE_OTHER): Payer: Federal, State, Local not specified - PPO | Admitting: Neurology

## 2021-11-04 DIAGNOSIS — R42 Dizziness and giddiness: Secondary | ICD-10-CM

## 2021-11-04 DIAGNOSIS — G4730 Sleep apnea, unspecified: Secondary | ICD-10-CM

## 2021-11-04 DIAGNOSIS — G4731 Primary central sleep apnea: Secondary | ICD-10-CM

## 2021-11-04 DIAGNOSIS — R55 Syncope and collapse: Secondary | ICD-10-CM

## 2021-11-04 DIAGNOSIS — K902 Blind loop syndrome, not elsewhere classified: Secondary | ICD-10-CM

## 2021-11-04 DIAGNOSIS — G4734 Idiopathic sleep related nonobstructive alveolar hypoventilation: Secondary | ICD-10-CM

## 2021-11-10 DIAGNOSIS — G4734 Idiopathic sleep related nonobstructive alveolar hypoventilation: Secondary | ICD-10-CM

## 2021-11-10 DIAGNOSIS — G4731 Primary central sleep apnea: Secondary | ICD-10-CM | POA: Insufficient documentation

## 2021-11-10 HISTORY — DX: Idiopathic sleep related nonobstructive alveolar hypoventilation: G47.34

## 2021-11-10 NOTE — Procedures (Signed)
PATIENT'S NAME:  Hector [name], Hector [name] DOB:      29-May-1961      MR#:    [id number]     DATE OF RECORDING: 11/04/2021 REFERRING M.D.:  Scarlette Calico, MD Study Performed:   Titration to positive airway pressure  HISTORY:  Hector [name] is a 61 year-old Caucasian male patient, who presented upon PCP referral on 08/01/2021 ( Dr Ronnald Ramp) for a new sleep consultation.  Chief concern :  "I wonder if I still have apnea, I haven't used CPAP in a while "   HST from 09-28-2021 resulted with AHI of 73.6/h ! minor hypoxia, NREM dominant apnea, causing concern for central apnea. He returns for an in- lab evaluation, on 11-04-1021.   The patient endorsed the Epworth Sleepiness Scale at 10 points.   The patient's weight 165 pounds with a height of 70 (inches), resulting in a BMI of 23.7 kg/m2. The patient's neck circumference measured 16 inches.  CURRENT MEDICATIONS: Tylenol, Albuterol, Zyloprim, Celebrex, Lexapro, Krill and Fish [employer], Folvite, Dilaudid, Lamictal, Cozaar, Mobic, Prilosec, Percocet, Lyrica, E-Z Spacer, Flomax, Desyrel, Vitamin E, Zinc Gluconate, Nitrostat   PROCEDURE:  This is a multichannel digital polysomnogram utilizing the SomnoStar 11.2 system.  Electrodes and sensors were applied and monitored per AASM Specifications.   EEG, EOG, Chin and Limb EMG, were sampled at 200 Hz.  ECG, Snore and Nasal Pressure, Thermal Airflow, Respiratory Effort, CPAP Flow and Pressure, Oximetry was sampled at 50 Hz. Digital video and audio were recorded.       CPAP was initiated at 7 cmH20 for patient's comfort- AHI was only 5.9 but the patient felt he couldn't get enough air. He was used to nasal pillow but was given an [place]OR[place] FFM in small/medium. He had low oxygen saturation for the first 90 minutes of the sleep study.  CPAP pressure was advanced to 14 cmH20 because of hypopneas, apneas and desaturations- he reached an AHI of 7.1/h and snoring and hypoxia were now corrected. The residual AHI consisted still of central  apneas.   He was changed to BiPAP at 13/9 cm and 14/10 but the AHI was higher than before on CPAP.  At a PAP pressure of 14 cmH20, there was a reduction of the AHI to 7.1 with improvement of sleep apnea in comparison to baseline HST.  Lights Out was at 20:29 and Lights On at 05:00. Total recording time (TRT) was 511.5 minutes, with a total sleep time (TST) of 484 minutes. The patient's sleep latency was 3 minutes. REM latency was 152.5 minutes.  The sleep efficiency was 94.6 %.    SLEEP ARCHITECTURE: WASO (Wake after sleep onset) was 24.5 minutes.  There were 5 minutes in Stage N1, 309 minutes Stage N2, 87 minutes Stage N3 and 83 minutes in Stage REM.  The percentage of Stage N1 was 1.%, Stage N2 was 63.8%, Stage N3 was 18.% and Stage R (REM sleep) was 17.1%. The sleep architecture was notable for prolonged REM recovery on BiPAP settings where no central apneas tend to occur.  RESPIRATORY ANALYSIS:  There was a total of 86 respiratory events: 0 obstructive apneas, 63 central apneas and 0 mixed apneas with a total of 63 apneas and an apnea index (AI) of 7.8 /hour. There were 23 hypopneas with a hypopnea index of 2.9/hour.  The patient also had 0 respiratory event related arousals (RERAs).     The total APNEA/HYPOPNEA INDEX  (AHI) was 10.7 /hour.7 events occurred in REM sleep and 79 events in  NREM. The REM AHI was 5.1 /hour versus a non-REM AHI of 11.8 /hour. The patient spent 163 minutes of total sleep time in the supine position and 321 minutes in non-supine. The supine AHI was 9.9, versus a non-supine AHI of 11.0. OXYGEN SATURATION & C02:  The baseline 02 saturation was 94%, with the lowest being 84%. Time spent below 89% saturation equaled 30 minutes.  The patient had a total of 0 Periodic Limb Movements. The arousals were noted as: 54 were spontaneous, 0 were associated with PLMs, 15 were associated with respiratory events. Audio and video analysis did not show any abnormal or unusual movements,  behaviors, phonations or vocalizations.  The patient took one bathroom break. Snoring was noted at the beginning of the study. The arousals were noted as: 54 were spontaneous, 0 were associated with PLMs, 15 were associated with respiratory events.  Summary: CPAP was initiated at 5,6 and quickly increased to 7 cmH20 for patient's comfort- AHI at 7 cm was only 5.9 but the patient felt he couldn't get enough air.  He had intermittent low oxygen saturation within the first 90 minutes of the sleep study.  CPAP pressure was advanced to 14 cmH20 because of hypopneas, apneas and desaturations- he reached an AHI of 7.1/h and snoring and hypoxia were now corrected. The patient entered REM sleep when a switch to BiPAP 14/10 was initiated based on a low residual AHI which consisted of central apneas.  The prolonged REM period that followed had no central apneas- but once the patient returned to NREM sleep these returned.   He was changed to BiPAP at 13/9 cm from 14/10 but the AHI was higher than before on CPAP.  At a PAP pressure of 14 cmH20, there was a reduction of the AHI to 7.1 with improvement of sleep apnea, snoring and hypoxemia in comparison to baseline HST.  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 He was given a FFM, slept supine. CPAP was advanced for hypoxia and centrals to 14 cm water when the hypoxia resolved und central apneas became manageable in non-supine sleep as he entered REM sleep. BiPAP did not add relief at the two tried pressures.   PLANS/RECOMMENDATIONS: The patient may do best with an auto CPAP at 9 cm through 16 cm water with 3 cm EPR. I will leave it up to him if he prefers the [place] in small/medium. The patient has too severe central apnea and hypoxia to entertain any alternatives to PAP therapy.    DISCUSSION : A follow up appointment will be scheduled in  the Sleep Clinic at [facility].   Please call [phone] with any questions.      I certify that I have reviewed the entire raw data recording prior to the issuance of this report in accordance with the Standards of Accreditation of the American Academy of Sleep Medicine (AASM)      Larey Seat, M.D. Diplomat, Tax adviser of Neurology  Diplomat, Tax adviser of Sleep Medicine Market researcher, [employer] [facility] at [employer]

## 2021-11-10 NOTE — Addendum Note (Signed)
Addended by: Larey Seat on: 11/10/2021 06:41 PM   Modules accepted: Orders

## 2021-11-10 NOTE — Progress Notes (Signed)
CPAP was initiated at 5,6 and quickly increased to 7 cmH20 for patient's comfort- AHI at 7 cm was only 5.9 but the patient felt he couldn't get enough air. He remained hypoxic. At a PAP pressure of 14 cmH20, there was a reduction of the AHI to 7.1 with some improvement of central sleep apnea, snoring and hypoxemia in comparison to baseline HST.  DIAGNOSIS 1. 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 He was given a FFM, slept supine. CPAP was advanced for hypoxia and centrals to 14 cm water when the hypoxia resolved und central apneas became manageable in non-supine sleep as he entered REM sleep. 2. BiPAP did not add relief at the two tried pressures ( 14/10 and 13/9) .   PLANS/RECOMMENDATIONS: The patient may do best with an auto CPAP at 9 cm through 16 cm water with 3 cm EPR. I will leave it up to him if he prefers the [place] in small/medium. The patient has too severe central apnea and hypoxia to entertain any alternatives to PAP therapy.

## 2021-11-11 ENCOUNTER — Telehealth: Payer: Self-pay | Admitting: Neurology

## 2021-11-11 NOTE — Telephone Encounter (Signed)
I called pt. I advised pt that Dr. Brett Fairy reviewed their sleep study results and found that pt has sleep apnea best treated under CPAP. Dr. Brett Fairy recommends that pt start auto CPAP. I reviewed PAP compliance expectations with the pt. Pt is agreeable to starting a CPAP. I advised pt that an order will be sent to a DME, [facility], and [facility] will call the pt within about one week after they file with the pt's insurance. [facility] will show the pt how to use the machine, fit for masks, and troubleshoot the CPAP if needed. A follow up appt was made for insurance purposes with Dr. Brett Fairy on May 15,2023 at 8:30 am. Pt verbalized understanding to arrive 15 minutes early and bring their CPAP. A letter with all of this information in it will be mailed to the pt as a reminder. I verified with the pt that the address we have on file is correct. Pt verbalized understanding of results. Pt had no questions at this time but was encouraged to call back if questions arise. I have sent the order to [facility] and have received confirmation that they have received the order.

## 2021-11-11 NOTE — Telephone Encounter (Signed)
-----   Message from Larey Seat, MD sent at 11/10/2021  6:41 PM EST ----- CPAP was initiated at 5,6 and quickly increased to 7 cmH20 for patient's comfort- AHI at 7 cm was only 5.9 but the patient felt he couldn't get enough air. He remained hypoxic. At a PAP pressure of 14 cmH20, there was a reduction of the AHI to 7.1 with some improvement of central sleep apnea, snoring and hypoxemia in comparison to baseline HST.  DIAGNOSIS 1. 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 He was given a FFM, slept supine. CPAP was advanced for hypoxia and centrals to 14 cm water when the hypoxia resolved und central apneas became manageable in non-supine sleep as he entered REM sleep. 2. BiPAP did not add relief at the two tried pressures ( 14/10 and 13/9) .   PLANS/RECOMMENDATIONS: The patient may do best with an auto CPAP at 9 cm through 16 cm water with 3 cm EPR. I will leave it up to him if he prefers the [place] in small/medium. The patient has too severe central apnea and hypoxia to entertain any alternatives to PAP therapy.

## 2021-11-18 DIAGNOSIS — G4733 Obstructive sleep apnea (adult) (pediatric): Secondary | ICD-10-CM | POA: Diagnosis not present

## 2021-11-26 ENCOUNTER — Telehealth: Payer: Self-pay | Admitting: Neurology

## 2021-11-26 DIAGNOSIS — G63 Polyneuropathy in diseases classified elsewhere: Secondary | ICD-10-CM

## 2021-11-26 DIAGNOSIS — K902 Blind loop syndrome, not elsewhere classified: Secondary | ICD-10-CM

## 2021-11-26 NOTE — Telephone Encounter (Signed)
I will be sending additional notes like this for your patients with the emg/ncs. This is for Mr Hector [name] fyi  ? ?History: Patient is here today, he has a long history of bilateral foot burning tingling and numbness and multiple low back surgeries.  He has burning in the feet that can be severe.  He has a history of gastric bypass that could have caused vitamin deficiencies and also a history of diabetes in the past.    ? 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  And even though ectrodiagnostically his large fiber neuropathy symptoms are mild, he could have an underlying small fiber neuropathy as well that can be extremely painful.    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    ?- At this point I recommend that Dr. Brett Fairy check for vitamin deficiencies such as B12 deficiency if not already done, he can follow back up with his bariatric surgeon for follow-up on other vitamin deficiencies that may be ongoing that he may be needed to be treated for.  ?- Please see emg/ncs procedure for details  ?

## 2021-11-27 NOTE — Telephone Encounter (Signed)
Called and informed the pt that Dr Brett Fairy wanted him to come have lab work completed based off the recommendation from who completed the NCV/EMG. Orders are in and patient will plan on coming in tomorrow.  ?

## 2021-12-11 ENCOUNTER — Encounter: Payer: Self-pay | Admitting: Neurology

## 2021-12-19 DIAGNOSIS — G4733 Obstructive sleep apnea (adult) (pediatric): Secondary | ICD-10-CM | POA: Diagnosis not present

## 2022-01-18 DIAGNOSIS — G4733 Obstructive sleep apnea (adult) (pediatric): Secondary | ICD-10-CM | POA: Diagnosis not present

## 2022-01-27 ENCOUNTER — Encounter: Payer: Self-pay | Admitting: Neurology

## 2022-01-27 ENCOUNTER — Ambulatory Visit (INDEPENDENT_AMBULATORY_CARE_PROVIDER_SITE_OTHER): Payer: Federal, State, Local not specified - PPO | Admitting: Neurology

## 2022-01-27 VITALS — BP 156/99 | HR 78 | Ht 70.0 in | Wt 192.0 lb

## 2022-01-27 DIAGNOSIS — G4736 Sleep related hypoventilation in conditions classified elsewhere: Secondary | ICD-10-CM

## 2022-01-27 DIAGNOSIS — I951 Orthostatic hypotension: Secondary | ICD-10-CM | POA: Diagnosis not present

## 2022-01-27 DIAGNOSIS — G4737 Central sleep apnea in conditions classified elsewhere: Secondary | ICD-10-CM

## 2022-01-27 DIAGNOSIS — F1190 Opioid use, unspecified, uncomplicated: Secondary | ICD-10-CM

## 2022-01-27 DIAGNOSIS — G4731 Primary central sleep apnea: Secondary | ICD-10-CM

## 2022-01-27 NOTE — Progress Notes (Addendum)
? ? ?SLEEP MEDICINE CLINIC ?  ? ?Provider:  Larey Seat, MD  ?Primary Care Physician:  Janith Lima, MD ?[name] [id number]  ? ?  ?Referring Provider: Janith Lima, Md ?[place],  [name] [id number]  ?  ?  ?    ?Chief Complaint according to patient   ?Patient presents with:  ?  ? New Patient (Initial Visit)  ?    Here for initial CPAP f/u. Pt has had issues w his nasal pillow, makes his nose raw. Pt has noticed having lots of events and feels like his CPAP is not working/helping. High AHI seen . ?Pt continues to feel tired, sleepy and is worried . Pt is worried about taking trazadone and would like to discuss this today.   ?  ?  ?HISTORY OF PRESENT ILLNESS:  ?Hector [name] is a 61 y.o.  male patient seen here as a referral on 01/27/2022 from Dr. Ronnald Ramp for a RV and  consultation. He has been using CPAP but not seen benefit, has very, very high residual AHI. He is a CHRONIC PAIN PATIENT- NARCOTIC MEDS LIKELY CONTRIBUTING.  ? ?He underwent a HST last year- was leaning towards central apnea. Returned for in lab , titration 11-04-2021. ?BiPAP was asked to be titrated: he remained on CPAP after BiPAP  increased his residual apnea further. He was given a FFM , but had asked for a nasal pillow or cradle.CPAP was initiated at 5,6 and quickly increased to 7 cmH20 for patient's comfort- AHI at 7 cm was only 5.9 but the patient felt he couldn't get enough air. He remained hypoxic. At a PAP pressure of 14 cmH20, there was a reduction of the AHI to 7.1 with some improvement of central sleep apnea, snoring and hypoxemia in comparison to baseline HST. ?  ?DIAGNOSIS ?1.        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 He was given a FFM, slept supine. CPAP was advanced for hypoxia and centrals to 14 cm water when the hypoxia resolved und central apneas became manageable in  non-supine sleep as he entered REM sleep. ?2.        BiPAP did not add relief at the two tried pressures ( 14/10 and 13/9) .  ?  Now auto-set CPAP has been producing residual apneas: AHI 46/ h. ? ?Chief concern according to patient :  ?"I know now that I have central apnea, my CPAP hasn't made it better in a while "- what can we do?  My CPAP leaves me with much to many apneas.  ? ?I have the pleasure of seeing Hector [name] today, a right -handed male who received the diagnosis of obstructive sleep apnea at a time when he was morbidly obese. He underwent a UPPP/palatoplasty which did not resolve his snoring, he had diabetes type 2, and according to his wife was loudly snoring independent of position. In Feb. 2011 he underwent a gastric bypass surgery after which he has reached a normal BMI and initially all symptoms of obstructive sleep apnea had resolved.  ?Over the last 6 months ,he has again developed snoring some nights and he developed again excessive daytime sleepiness and fatigue. ?He has not further lost weight, has remained at BMI 24.  ?He had some falls, broke some ribs and ended up with a blood clot.[name] ?He  has some spells, blurred vision, fogginess, lightheadedness, described as if looking through gauze. He felt he could be presyncopal, but this was never cause of a fall.  ?His retesting by HST 09-28-2021 showed NREM dependent severe central apnea  ?He is now set up after in lab titration with CPAP and a FFM, which he is not fond of. CPAP is ineffective. See download.  ? ? He has longstanding history of polyneuropathy, but has reported dizziness, and weakness. Pt has neuropathy in legs, burning and pain in joints= Pain management has been tx w pregablin. Pt reports ongoing dizziness and weakness sensation for the last year. Vision starts to whiten out, like a bright light. He has some spells, blurred vision, fogginess, lightheadedness, described as if looking through gauze. He felt he could be presyncopal,  but this was never cause of a fall.  ?  ? ? He has a past medical history of Abnormal LFTs, ADJ DISORDER WITH MIXED ANXIETY & DEPRESSED MOOD (08/23/2009), Anxiety, Ascending aortic aneurysm (HCC), B12 nutritional deficiency, Bipolar disorder ([place]), Chest pain, Closed head injury, DDD (degenerative disc disease), Depression, Diabetes mellitus, GERD (gastroesophageal reflux disease), Gout, Headache, History of bunionectomy of left great toe, History of concussion, History of echocardiogram, History of kidney stones, History of renal stone, Hyperlipidemia, Hypertension (02/27/2017), Neuromuscular disorder ([place]), Nonspecific pain in the lumbar region, OBESITY (12/10/2007), OSA (obstructive sleep apnea) (12/12/2013), Osteoarthritis of knee, Pain in left knee, Pancreatitis (03/21/2016), Perforated ulcer ([place]), Peripheral neuropathy, PONV (postoperative nausea and vomiting), and Pulmonary embolism ([place]).  ?3 weekends ago, He fell into a boat standing on a deck. Shoulder was dislocated.  Ed report: Hector [name] is a 61 y.o. male with medical history significant for bipolar, anxiety, depression, degenerative disc disease, GERD, gout, bunionectomy, history of concussion, hyperlipidemia, hypertension, history of pancreatitis, osteoarthritis, history of vasectomy,obesity, chronic back pain, transthoracic aortic aneurysm, presents emergency department for chief concerns of confusion. ?  ?He was unstable on his feet, and reports that while walking to kitchen, he felt unstable and fell on his bottom. He endorses dizziness.  He states that at baseline he is prescribed 4 Percocets per day however today he only took 1.   ?He endorses left foot pain.   ?He reports history of concussion approximately 3 weeks ago, after falling and hitting his head a collector's (glass) cabinet, he got three stitches in the ED and was sent home. He reports this has never happened before .  ?MRI was  reviewed and normal. I  ?Sleep relevant medical history:  leg pain with neuropathy, not RLS- Nocturia 2-4, no Sleep walking,  Tonsillectomy: yes UPPP. ? ?  ?Family medical /sleep history: Father with neuropathy and apnea. He is not diabetic.  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 Tobacco use, never .  ? ETOH use: none ,  ?Caffeine intake in form of Coffee( /) Soda( 1-3 / week) Tea ( 1-3/week) no energy drinks. ?Regular exercise: limited by back pain. Dr Nelva Bush follows.   ?Hobbies :technic.  ?  ?Sleep habits are as follows: The patient's dinner time is between 5-6 PM. The patient goes to bed between 9-11 PM and continues to sleep for 8 hours, wakes for many bathroom breaks, the first time at 2 AM.   ?The preferred sleep position is right side, in a recliner. " Sleep chair" , with the support of 1-2 pillows. Dreams are reportedly frequent/vivid. He still  snores, according to his wife.  ? 7-8 AM is the usual rise time. The patient wakes up spontaneously. ?He reports not feeling refreshed or restored in AM,  Naps are taken 1-2 days a week, frequently, lasting from 40 to 60 minutes and are morerefreshing than nocturnal sleep.  ?  ?Review of Systems: ?Out of a complete 14 system review, the patient complains of only the following symptoms, and all other reviewed systems are negative.:  ?Fatigue, sleepiness , snoring, fragmented sleep, nocturia, status post UPPP. Poly-neuropathy after years of DM but can also be related to roux and y surgery-  ? ?Lightheadedness, balance problems, blurring of vision, presyncope.  ? ?Orthostatic blood pressures : taken at PCP;  ? ?Many falls , 3 ED admissions this year alone, broken bone, facial lacerations.  He denies any LOC- any seizure like activity.  ?He takes 100- 200 mg of trazodone to sleep.  ?  ?How likely are you to doze in the following situations: ?0 = not likely, 1 = slight chance, 2 = moderate chance, 3 = high chance ?  ?Sitting and  Reading? ?Watching Television? ?Sitting inactive in a public place (theater or meeting)? ?As a passenger in a car for an hour without a break? ?Lying down in the afternoon when circumstances permit? ?Sitting and talking to som

## 2022-01-27 NOTE — Addendum Note (Signed)
Addended by: Larey Seat on: 01/27/2022 04:26 PM ? ? Modules accepted: Orders ? ?

## 2022-02-05 ENCOUNTER — Telehealth: Payer: Self-pay | Admitting: Neurology

## 2022-02-05 NOTE — Telephone Encounter (Signed)
Called [facility] back and advised as of now he had his initial cpap visit in may which indicated he needs to complete a bipap titration. Advised that for now he has not completed that test but once he does if it in fact indicates a bipap is needed than we will order at that time. She was appreciative for the call back to clarify

## 2022-02-05 NOTE — Telephone Encounter (Signed)
Kristen @ [facility] states based on notes she is not clear on what is being requested for pt, Cyril Mourning states if it is a Bi-pap another script is needed, if CPAP or sleep study please call to clarify. Ph [phone]

## 2022-02-06 ENCOUNTER — Encounter: Payer: Self-pay | Admitting: Internal Medicine

## 2022-02-07 ENCOUNTER — Ambulatory Visit: Payer: Federal, State, Local not specified - PPO | Admitting: Internal Medicine

## 2022-02-07 ENCOUNTER — Telehealth: Payer: Self-pay

## 2022-02-07 ENCOUNTER — Encounter: Payer: Self-pay | Admitting: Internal Medicine

## 2022-02-07 VITALS — BP 124/82 | HR 83 | Ht 70.0 in | Wt 193.0 lb

## 2022-02-07 DIAGNOSIS — K219 Gastro-esophageal reflux disease without esophagitis: Secondary | ICD-10-CM | POA: Diagnosis not present

## 2022-02-07 DIAGNOSIS — D696 Thrombocytopenia, unspecified: Secondary | ICD-10-CM | POA: Diagnosis not present

## 2022-02-07 DIAGNOSIS — I10 Essential (primary) hypertension: Secondary | ICD-10-CM

## 2022-02-07 DIAGNOSIS — Z9884 Bariatric surgery status: Secondary | ICD-10-CM

## 2022-02-07 DIAGNOSIS — M109 Gout, unspecified: Secondary | ICD-10-CM

## 2022-02-07 MED ORDER — ALLOPURINOL 100 MG PO TABS: 100.0000 mg | ORAL_TABLET | Freq: Every day | ORAL | 1 refills | Status: DC

## 2022-02-07 NOTE — Telephone Encounter (Signed)
Hoy Register. This patient came into our clinic today as a new patient. We see in the chart where your office ordered labs for him but the labs are not released on our end. Did he ever have the labs drawn?  Thanks!

## 2022-02-07 NOTE — Progress Notes (Signed)
Date:  02/07/2022   Name:  Hector [name]   DOB:  24-May-1961   MRN:  [id number]   Chief Complaint: New Patient (Initial Visit)  Hypertension This is a chronic problem. The problem is controlled (mostly diet controlled; takes losartan as needed). Pertinent negatives include no chest pain, headaches or shortness of breath.  Gastroesophageal Reflux He reports no abdominal pain, no chest pain, no coughing or no wheezing. This is a recurrent problem. Pertinent negatives include no fatigue. hx of ulcer after gastric bypass.  Toe Pain  The quality of the pain is described as cramping and shooting (intermittent gout in right toe/foot). Associated symptoms include numbness. Treatments tried: allopurinol prevention. The treatment provided significant relief.   Lab Results  Component Value Date   NA 138 08/01/2021   K 4.3 08/01/2021   CO2 26 08/01/2021   GLUCOSE 115 (H) 04/03/2021   BUN 14 04/03/2021   CREATININE 1.19 04/03/2021   CALCIUM 8.8 (L) 04/03/2021   GFRNONAA >60 04/03/2021   Lab Results  Component Value Date   CHOL 110 05/17/2020   HDL 62 05/17/2020   LDLCALC 13 05/17/2020   LDLDIRECT 113.2 04/01/2007   TRIG 176 (A) 05/17/2020   CHOLHDL 3 02/23/2018   Lab Results  Component Value Date   TSH 0.525 04/01/2021   Lab Results  Component Value Date   HGBA1C 5.7 05/08/2021   Lab Results  Component Value Date   WBC 3.6 (L) 05/08/2021   HGB 11.1 (L) 05/08/2021   HCT 34.4 (L) 05/08/2021   MCV 90.6 05/08/2021   PLT 94.0 (L) 05/08/2021   Lab Results  Component Value Date   ALT 15 04/01/2021   AST 42 (H) 04/01/2021   ALKPHOS 79 04/01/2021   BILITOT 0.6 04/01/2021   Lab Results  Component Value Date   VD25OH 24.0 (L) 03/20/2016     Review of Systems  Constitutional:  Negative for chills, fatigue, fever and unexpected weight change.  HENT:  Negative for trouble swallowing.   Eyes:  Negative for visual disturbance.  Respiratory:  Negative for cough, chest tightness,  shortness of breath and wheezing.   Cardiovascular:  Negative for chest pain and leg swelling.  Gastrointestinal:  Negative for abdominal pain, blood in stool, constipation and diarrhea.  Musculoskeletal:  Positive for back pain and gait problem.  Allergic/Immunologic: Positive for environmental allergies.  Neurological:  Positive for numbness. Negative for dizziness, light-headedness and headaches.  Psychiatric/Behavioral:  Positive for dysphoric mood. Negative for sleep disturbance. The patient is not nervous/anxious.    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 deficiency 12/02/2020   Elevated LFTs 11/26/2020   Dietary folate deficiency anemia 11/26/2020   Ascending aortic aneurysm (HCC)    Osteoarthritis of knee 11/02/2018   History of pulmonary embolus (PE) 09/24/2018   Hypertension 02/27/2017   Chronic low back pain 11/24/2016   Perennial allergic rhinitis 11/26/2015   Renal calculi    Spinal stenosis, lumbar region, 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  Possible serotonin syndrome.   Nsaids Other (See Comments)    Perforated ulcer   Tape Rash    Surgical tape   Hydrocodone Other (See Comments)    headache   Vancomycin Rash    Red man syndrome.  Please premedicate.    Past Surgical History:  Procedure Laterality Date   ABDOMINAL EXPOSURE N/A 03/29/2020   Procedure: ABDOMINAL EXPOSURE;  Surgeon: Rosetta Posner, MD;  Location[facility] OR;  Service: Vascular;  Laterality: N/A;   ANTERIOR LUMBAR FUSION N/A 03/29/2020   Procedure: ANTERIOR LUMBAR FUSION (ALIF) L4-5;  Surgeon: Melina Schools, MD;  Location: [place];  Service: Orthopedics;  Laterality: N/A;  5.5 hrs Dr. Donnetta Hutching do approach Lt tap block with exparel   BACK SURGERY  2012   Dr. Dorina Hoyer disk   CARPAL TUNNEL RELEASE  2008   left-ulnar nerve decomp   carpel tunnel Left    CHOLECYSTECTOMY N/A 03/21/2016   Procedure: LAPAROSCOPIC CHOLECYSTECTOMY WITH INTRAOPERATIVE CHOLANGIOGRAM;  Surgeon: Ralene Ok, MD;  Location: [facility] ORS;  Service: General;  Laterality: N/A;   colonscopy      DEBRIDEMENT AND CLOSURE WOUND N/A 05/09/2020   Procedure: Irrigation and debridement of superficial wound closure;  Surgeon: Melina Schools, MD;  Location: [place];  Service: Orthopedics;  Laterality: N/A;  1 hr   DIAGNOSTIC LAPAROSCOPY     laparoscopic gastric bypass   EPIDURAL BLOCK INJECTION     by Dr. Nelva Bush   FOOT SURGERY     bilat for removal of extra bone    GASTRIC BYPASS  11/06/09   beginning weight 267   HARDWARE REMOVAL N/A 05/02/2016   Procedure: Removal of hardware Lumbar five-Sacral one with Metrex;  Surgeon: Kristeen Miss, MD;  Location: [facility];  Service: Neurosurgery;  Laterality: N/A;  Removal of hardware L5-S1 with Metrex   HERNIA REPAIR  [id number]   umbilical   HIATAL HERNIA REPAIR N/A 01/20/2013   Procedure: LAPAROSCOPIC REPAIR OF INTERNAL HERNIA;  Surgeon: Shann Medal, MD;  Location: [facility];  Service: General;  Laterality: N/A;   LUMBAR  LAMINECTOMY/DECOMPRESSION MICRODISCECTOMY Left 06/07/2014   Procedure: MICRO LUMBAR DECOMPRESSION L5 - S1 ON THE LEFT/L5 FORAMINOTOMY/EXCISION SYNOVIAL CYST  1 LEVEL;  Surgeon: [name], MD;  Location: [facility] OR[facility];  Service: Orthopedics;  Laterality: Left;   MASS EXCISION Right 04/18/2013   Procedure: RIGHT SMALL FINGER SKIN LESION EXCISION;  Surgeon: Jolyn Nap, MD;  Location: [place];  Service: Orthopedics;  Laterality: Right;   NASAL SEPTOPLASTY W/ TURBINOPLASTY     x 2   NEPHROLITHOTOMY Right 11/03/2014   Procedure: RIGHT PERCUTANEOUS NEPHROLITHOTOMY;  Surgeon: Claybon Jabs, MD;  Location: [facility] OR[facility];  Service: Urology;  Laterality: Right;   SHOULDER ARTHROSCOPY  2014   rt   surgerical repair of stomach ulcer  06/08/10   per   TONSILLECTOMY     UVULOPALATOPHARYNGOPLASTY  2007   with tonsils   VASECTOMY      Social History   Tobacco Use   Smoking status: Never   Smokeless tobacco: Never  Vaping Use   Vaping Use: Never used  Substance Use Topics   Alcohol use: Yes    Comment: occas liquor    Drug use: No     Medication list has been reviewed and updated.  Current Meds  Medication Sig   acetaminophen (TYLENOL) 500 MG tablet Take 1,000 mg by mouth every 6 (six) hours as needed for mild pain.   albuterol (PROVENTIL HFA;VENTOLIN HFA) 108 (90 Base) MCG/ACT inhaler Inhale 2 puffs into the lungs every 6 (six) hours as needed for wheezing or shortness of breath.   celecoxib (CELEBREX) 200 MG capsule Take 200 mg by mouth as needed.   colchicine 0.6 MG tablet TAKE ONE TABLET BY MOUTH DAILY AS NEEDED FOR GOUT   escitalopram (LEXAPRO) 20 MG tablet Take 20 mg by mouth every morning.    Fish Oil-Krill Oil (KRILL & FISH OIL BLEND PO) Take 1 capsule by mouth daily.   folic acid (FOLVITE) 1 MG tablet Take 1 tablet (1 mg total) by mouth daily.   lamoTRIgine (LAMICTAL) 100 MG tablet Take 150 mg by mouth 2 (two) times daily.    losartan (COZAAR) 25 MG tablet Take 1  tablet (25 mg total) by mouth daily. Take if SBP >130 mmHg in am and take additional dose at night if SBP >130 mmHg   nitroGLYCERIN (NITROSTAT) 0.3 MG SL tablet Place 1 tablet (0.3 mg total) under the tongue every 5 (five) minutes as needed for chest pain.   omeprazole (PRILOSEC) 40 MG capsule Take 40 mg by mouth daily.   Oxycodone HCl 10 MG TABS oxycodone 10 mg tablet  Take 1 tablet every 4 hours by oral route as needed.   oxyCODONE-acetaminophen (PERCOCET) 10-325 MG tablet Take 1 tablet by mouth every 6 (six) hours as needed for pain. Takes 1 tablet in the morning and 1 tablet at bedtime   pregabalin (LYRICA) 150 MG capsule Take 150 mg by mouth 2 (two) times daily.   Spacer/Aero-Holding Chambers (E-Z SPACER) inhaler Use as instructed   tamsulosin (FLOMAX) 0.4 MG CAPS capsule Take 0.4 mg by mouth daily as needed (kidney stones).    traZODone (DESYREL) 100 MG tablet Take 300 mg by mouth at bedtime.   vitamin E 180 MG (400 UNITS) capsule Take 400 Units by mouth daily.   zinc gluconate 50 MG tablet Take 1 tablet (50 mg total) by mouth daily.   [DISCONTINUED] allopurinol (ZYLOPRIM) 100 MG tablet Take 1 tablet (100 mg total) by mouth daily.   [DISCONTINUED] meloxicam (MOBIC) 15 MG tablet TAKE ONE TABLET BY MOUTH DAILY (Patient taking differently: Take 15 mg by mouth daily as needed for pain.)        View : No data to display.             06/03/2017    9:52 AM  Depression screen PHQ 2/9  Decreased Interest 0  Down, Depressed, Hopeless 0  PHQ - 2 Score 0    BP Readings from Last 3 Encounters:  02/07/22 124/82  01/27/22 (!) 156/99  08/01/21 (!) 136/92    Physical Exam Neck:     Vascular: No carotid bruit.  Cardiovascular:     Rate and Rhythm: Normal rate and regular rhythm.     Pulses: Normal pulses.     Heart sounds: No murmur heard. Pulmonary:     Effort: Pulmonary effort is normal.     Breath sounds: No wheezing or rhonchi.  Musculoskeletal:     Right lower leg: No edema.  Left lower leg: No edema.  Lymphadenopathy:     Cervical: No cervical adenopathy.  Skin:    General: Skin is warm and dry.  Neurological:     Mental Status: He is alert.     Gait: Gait abnormal (wide based, steady).    Wt Readings from Last 3 Encounters:  02/07/22 193 lb (87.5 kg)  01/27/22 192 lb (87.1 kg)  08/01/21 165 lb (74.8 kg)    BP 124/82   Pulse 83   Ht '5\' 10"'$  (1.778 m)   Wt 193 lb (87.5 kg)   SpO2 97%   BMI 27.69 kg/m   Assessment and Plan: 1. Gout, arthritis Gout has been controlled on daily Allopurinol. He takes colchicine as needed for flares - allopurinol (ZYLOPRIM) 100 MG tablet; Take 1 tablet (100 mg total) by mouth daily.  Dispense: 90 tablet; Refill: 1  2. Primary hypertension BP controlled on diet alone Continue to monitor at home - use Losartan as needed.  3. Gastroesophageal reflux disease without esophagitis Symptoms well controlled - taking omeprazole only PRN No red flag signs such as weight loss, n/v, melena  4. Thrombocytopenia ([place]) CBC was ordered, along with other labs by Neurology in March Results are not seen although pt is sure he had these done Will see if we can request these  5. Status post bariatric surgery On Zinc, Folate and vitamin D Vitamin B12 stopped last year when his level was very high during a hospital encounter. This has not been recheck since then.   Partially dictated using Editor, commissioning. Any errors are unintentional.  Halina Maidens, MD [facility]  02/07/2022

## 2022-02-11 NOTE — Telephone Encounter (Signed)
Called patient and left VM asking him to please go back to office to have labs drawn since they are active in my chart.

## 2022-02-11 NOTE — Telephone Encounter (Signed)
Hey [name], No, the patient had told me he would come to [facility] to have them drawn but then never came to have them completed. We didn't release them because he was suppose to come back here to have them completed.

## 2022-02-20 ENCOUNTER — Telehealth: Payer: Self-pay | Admitting: Neurology

## 2022-02-20 NOTE — Telephone Encounter (Signed)
I received a letter from [facility] stating they have denied the sleep study for bipap titration.   I spoke with Hector [name] she informed I can do a provider courtesy review with additional information.   I faxed all the information that I could find along with the previous sleep study results.[name]

## 2022-02-26 DIAGNOSIS — M25512 Pain in left shoulder: Secondary | ICD-10-CM | POA: Diagnosis not present

## 2022-03-03 NOTE — Telephone Encounter (Signed)
[employer] fed Josem Kaufmann: [id number] (exp. 02/28/22 to 03/29/22) patient is scheduled at [facility] for 03/05/22 at 8 pm.

## 2022-03-05 ENCOUNTER — Ambulatory Visit (INDEPENDENT_AMBULATORY_CARE_PROVIDER_SITE_OTHER): Payer: Federal, State, Local not specified - PPO | Admitting: Neurology

## 2022-03-05 DIAGNOSIS — G4737 Central sleep apnea in conditions classified elsewhere: Secondary | ICD-10-CM

## 2022-03-05 DIAGNOSIS — F1190 Opioid use, unspecified, uncomplicated: Secondary | ICD-10-CM

## 2022-03-05 DIAGNOSIS — I951 Orthostatic hypotension: Secondary | ICD-10-CM

## 2022-03-05 DIAGNOSIS — G4731 Primary central sleep apnea: Secondary | ICD-10-CM | POA: Diagnosis not present

## 2022-03-05 DIAGNOSIS — G4736 Sleep related hypoventilation in conditions classified elsewhere: Secondary | ICD-10-CM

## 2022-03-07 ENCOUNTER — Ambulatory Visit: Payer: Federal, State, Local not specified - PPO | Admitting: Internal Medicine

## 2022-03-09 DIAGNOSIS — M25512 Pain in left shoulder: Secondary | ICD-10-CM | POA: Diagnosis not present

## 2022-03-11 ENCOUNTER — Telehealth: Payer: Self-pay

## 2022-03-21 DIAGNOSIS — M25512 Pain in left shoulder: Secondary | ICD-10-CM | POA: Diagnosis not present

## 2022-03-24 ENCOUNTER — Telehealth: Payer: Self-pay | Admitting: Internal Medicine

## 2022-03-24 NOTE — Telephone Encounter (Signed)
Copied from [place]. Topic: Referral - Status >> Mar 24, 2022 10:14 AM Cyndi Bender wrote: Reason for CRM: Pt stated he needs Dr. Army Melia to sign off on th paperwork from Dr. Tonita Cong for his shoulder surgery. Pt stated he needs to have this surgery asap so please ask Dr. Army Melia to go ahead and sign off on the paperwork.

## 2022-03-26 ENCOUNTER — Ambulatory Visit (HOSPITAL_BASED_OUTPATIENT_CLINIC_OR_DEPARTMENT_OTHER): Payer: Federal, State, Local not specified - PPO | Admitting: Orthopaedic Surgery

## 2022-03-27 ENCOUNTER — Telehealth: Payer: Self-pay

## 2022-03-27 NOTE — Telephone Encounter (Signed)
PC to pt, unable to LVM. Pt needs to make an appointment with Dr. Carolin Coy next Friday.

## 2022-03-27 NOTE — Telephone Encounter (Signed)
Hector [name], CMA called patient and spoke with patient while I witnessed conversation. Patient was saying he was very upset because when he calls our office he cannot speak directly to [place], and has to talk to different people. She explained in order to get surgical clearance he will need an in person visit and offered appt for Friday 07/21. He said "Dr Army Melia is going to hear it from me. I am going to cuss her out for making me go through getting an appointment to get pain relief. She is going to feel the pain that I'm feeling." Patient was informed that if he is unhappy he can find another Primary care. He said he will take the appt, and informed of time and date.   - Charlotte Fidalgo

## 2022-03-27 NOTE — Telephone Encounter (Signed)
PC from pt to apologize for his behavior and ask if we had cancellations to call and have him moved up. Pt in bad pain. Explained to pt I would keep a eye out and give him a call if need be.

## 2022-03-27 NOTE — Telephone Encounter (Signed)
Pt is calling back upset and requesting an update. I advised pt Dr.Berglund was out of the office and asked if he wanted to schedule. Pt demanding a different provider feel this form out. pt stated he is in pain and needs this done as soon as possible.   Pt is requesting a call back today.   Please advise.

## 2022-04-04 ENCOUNTER — Ambulatory Visit: Payer: Federal, State, Local not specified - PPO | Admitting: Internal Medicine

## 2022-04-04 ENCOUNTER — Other Ambulatory Visit: Payer: Self-pay | Admitting: Internal Medicine

## 2022-04-04 ENCOUNTER — Encounter: Payer: Self-pay | Admitting: Internal Medicine

## 2022-04-04 VITALS — BP 138/84 | HR 88 | Ht 70.0 in | Wt 197.0 lb

## 2022-04-04 DIAGNOSIS — M67912 Unspecified disorder of synovium and tendon, left shoulder: Secondary | ICD-10-CM | POA: Diagnosis not present

## 2022-04-04 DIAGNOSIS — I10 Essential (primary) hypertension: Secondary | ICD-10-CM

## 2022-04-04 DIAGNOSIS — K219 Gastro-esophageal reflux disease without esophagitis: Secondary | ICD-10-CM

## 2022-04-04 DIAGNOSIS — I7121 Aneurysm of the ascending aorta, without rupture: Secondary | ICD-10-CM | POA: Diagnosis not present

## 2022-04-04 DIAGNOSIS — G4733 Obstructive sleep apnea (adult) (pediatric): Secondary | ICD-10-CM

## 2022-04-04 NOTE — Progress Notes (Signed)
Date:  04/04/2022   Name:  Hector [name]   DOB:  [birth year]   MRN:  [id number]   Chief Complaint: Pre-op Exam Surgical clearance:  Planning rotator cuff repair with [facility] under general anesthesia.  He has had multiple previous surgeries with no anesthesia complications.  Hypertension This is a chronic problem. The problem is controlled. Pertinent negatives include no chest pain, headaches, palpitations or shortness of breath. Past treatments include angiotensin blockers and diuretics.   OSA - hx of apnea, resolved after bypass for weight - then symptoms recurred and repeat study suggested central apnea.  He was unable to tolerate the nasal pillows or the full face mask.  Neurology wanted him to start BiPap but he has not yet received the equipment.  Lab Results  Component Value Date   NA 138 08/01/2021   K 4.3 08/01/2021   CO2 26 08/01/2021   GLUCOSE 115 (H) 04/03/2021   BUN 14 04/03/2021   CREATININE 1.19 04/03/2021   CALCIUM 8.8 (L) 04/03/2021   GFRNONAA >60 04/03/2021   Lab Results  Component Value Date   CHOL 110 05/17/2020   HDL 62 05/17/2020   LDLCALC 13 05/17/2020   LDLDIRECT 113.2 04/01/2007   TRIG 176 (A) 05/17/2020   CHOLHDL 3 02/23/2018   Lab Results  Component Value Date   TSH 0.525 04/01/2021   Lab Results  Component Value Date   HGBA1C 5.7 05/08/2021   Lab Results  Component Value Date   WBC 3.6 (L) 05/08/2021   HGB 11.1 (L) 05/08/2021   HCT 34.4 (L) 05/08/2021   MCV 90.6 05/08/2021   PLT 94.0 (L) 05/08/2021   Lab Results  Component Value Date   ALT 15 04/01/2021   AST 42 (H) 04/01/2021   ALKPHOS 79 04/01/2021   BILITOT 0.6 04/01/2021   Lab Results  Component Value Date   VD25OH 24.0 (L) 03/20/2016     Review of Systems  Constitutional:  Negative for fatigue and unexpected weight change.  HENT:  Negative for nosebleeds.   Eyes:  Negative for visual disturbance.  Respiratory:  Negative for cough, chest tightness, shortness of  breath and wheezing.   Cardiovascular:  Negative for chest pain, palpitations and leg swelling.  Gastrointestinal:  Negative for abdominal pain, constipation and diarrhea.  Musculoskeletal:  Positive for arthralgias (left shoulder).  Neurological:  Negative for dizziness, weakness, light-headedness and headaches.  Psychiatric/Behavioral:  Negative for dysphoric mood and sleep disturbance. The patient is not nervous/anxious.     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 deficiency 12/02/2020   Elevated LFTs 11/26/2020   Dietary folate deficiency anemia 11/26/2020   Ascending aortic aneurysm (HCC)    Osteoarthritis of knee 11/02/2018   History of pulmonary embolus (PE) 09/24/2018   Hypertension 02/27/2017   Chronic low back pain 11/24/2016   Perennial allergic rhinitis 11/26/2015   Renal calculi    Spinal stenosis, lumbar region, 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  Possible serotonin syndrome.   Nsaids Other (See  Comments)    Perforated ulcer   Tape Rash    Surgical tape   Hydrocodone Other (See Comments)    headache   Vancomycin Rash    Red man syndrome.  Please premedicate.    Past Surgical History:  Procedure Laterality Date   ABDOMINAL EXPOSURE N/A 03/29/2020   Procedure: ABDOMINAL EXPOSURE;  Surgeon: Rosetta Posner, MD;  Location[facility];  Service: Vascular;  Laterality: N/A;   ANTERIOR LUMBAR FUSION N/A 03/29/2020   Procedure: ANTERIOR LUMBAR FUSION (ALIF) L4-5;  Surgeon: Melina Schools, MD;  Location: [place];  Service: Orthopedics;  Laterality: N/A;  5.5 hrs Dr. Donnetta Hutching do approach Lt tap block with exparel   BACK SURGERY  2012   Dr. Dorina Hoyer disk   CARPAL TUNNEL RELEASE  2008   left-ulnar nerve decomp   carpel tunnel Left    CHOLECYSTECTOMY N/A 03/21/2016   Procedure: LAPAROSCOPIC CHOLECYSTECTOMY WITH INTRAOPERATIVE CHOLANGIOGRAM;  Surgeon: Ralene Ok, MD;  Location: [facility];  Service: General;  Laterality: N/A;   colonscopy      DEBRIDEMENT AND CLOSURE WOUND N/A 05/09/2020   Procedure: Irrigation and debridement of superficial wound closure;  Surgeon: Melina Schools, MD;  Location: [place];  Service: Orthopedics;  Laterality: N/A;  1 hr   DIAGNOSTIC LAPAROSCOPY     laparoscopic gastric bypass   EPIDURAL BLOCK INJECTION     by Dr. Nelva Bush   FOOT SURGERY     bilat for removal of extra bone    GASTRIC BYPASS  11/06/09   beginning weight 267   HARDWARE REMOVAL N/A 05/02/2016   Procedure: Removal of hardware Lumbar five-Sacral one with Metrex;  Surgeon: Kristeen Miss, MD;  Location: [facility];  Service: Neurosurgery;  Laterality: N/A;  Removal of hardware L5-S1 with Metrex   HERNIA REPAIR  4401   umbilical   HIATAL HERNIA REPAIR N/A 01/20/2013   Procedure: LAPAROSCOPIC REPAIR OF INTERNAL HERNIA;  Surgeon: Shann Medal, MD;  Location: [facility];  Service: General;  Laterality: N/A;   LUMBAR LAMINECTOMY/DECOMPRESSION MICRODISCECTOMY Left 06/07/2014   Procedure: MICRO LUMBAR DECOMPRESSION L5 -  S1 ON THE LEFT/L5 FORAMINOTOMY/EXCISION SYNOVIAL CYST  1 LEVEL;  Surgeon: [name], MD;  Location: [facility];  Service: Orthopedics;  Laterality: Left;   MASS EXCISION Right 04/18/2013   Procedure: RIGHT SMALL FINGER SKIN LESION EXCISION;  Surgeon: Jolyn Nap, MD;  Location: [place];  Service: Orthopedics;  Laterality: Right;   NASAL SEPTOPLASTY W/ TURBINOPLASTY     x 2   NEPHROLITHOTOMY Right 11/03/2014   Procedure: RIGHT PERCUTANEOUS NEPHROLITHOTOMY;  Surgeon: Claybon Jabs, MD;  Location: [facility];  Service: Urology;  Laterality: Right;   SHOULDER ARTHROSCOPY  2014   rt   surgerical repair of stomach ulcer  06/08/10   per   TONSILLECTOMY     UVULOPALATOPHARYNGOPLASTY  2007   with tonsils   VASECTOMY      Social History   Tobacco Use   Smoking status: Never   Smokeless tobacco: Never  Vaping Use   Vaping Use: Never  used  Substance Use Topics   Alcohol use: Yes    Comment: occas liquor    Drug use: No     Medication list has been reviewed and updated.  Current Meds  Medication Sig   acetaminophen (TYLENOL) 500 MG tablet Take 1,000 mg by mouth every 6 (six) hours as needed for mild pain.   albuterol (PROVENTIL HFA;VENTOLIN HFA) 108 (90 Base) MCG/ACT inhaler Inhale 2 puffs into the lungs every 6 (six) hours as needed for wheezing or shortness of breath.   allopurinol (ZYLOPRIM) 100 MG tablet Take 1 tablet (100 mg total) by mouth daily.   celecoxib (CELEBREX) 200 MG capsule Take 200 mg by mouth as needed.   colchicine 0.6 MG tablet TAKE ONE TABLET BY MOUTH DAILY AS NEEDED FOR GOUT   escitalopram (LEXAPRO) 20 MG tablet Take 20 mg by mouth every morning.    Fish Oil-Krill Oil (KRILL & FISH OIL BLEND PO) Take 1 capsule by mouth daily.   folic acid (FOLVITE) 1 MG tablet Take 1 tablet (1 mg total) by mouth daily.   lamoTRIgine (LAMICTAL) 100 MG tablet Take 150 mg by mouth 2 (two) times daily.    losartan (COZAAR) 25 MG tablet Take 1 tablet (25 mg total) by  mouth daily. Take if SBP >130 mmHg in am and take additional dose at night if SBP >130 mmHg (Patient taking differently: Take 25 mg by mouth as needed. Take if SBP >130 mmHg in am and take additional dose at night if SBP >130 mmHg)   methocarbamol (ROBAXIN) 500 MG tablet Take 500 mg by mouth daily.   nitroGLYCERIN (NITROSTAT) 0.3 MG SL tablet Place 1 tablet (0.3 mg total) under the tongue every 5 (five) minutes as needed for chest pain.   omeprazole (PRILOSEC) 40 MG capsule Take 40 mg by mouth daily.   Oxycodone HCl 10 MG TABS oxycodone 10 mg tablet  Take 1 tablet every 4 hours by oral route as needed.   oxyCODONE-acetaminophen (PERCOCET) 10-325 MG tablet Take 1 tablet by mouth every 6 (six) hours as needed for pain. Takes 1 tablet in the morning and 1 tablet at bedtime   pregabalin (LYRICA) 150 MG capsule Take 150 mg by mouth 2 (two) times daily.   QUEtiapine (SEROQUEL) 50 MG tablet    Spacer/Aero-Holding Chambers (E-Z SPACER) inhaler Use as instructed   tamsulosin (FLOMAX) 0.4 MG CAPS capsule Take 0.4 mg by mouth daily as needed (kidney stones).    traZODone (DESYREL) 100 MG tablet Take 300 mg by mouth at bedtime.   vitamin E 180 MG (400 UNITS) capsule Take 400 Units by mouth daily.   zinc gluconate 50 MG tablet Take 1 tablet (50 mg total) by mouth daily.       04/04/2022   10:16 AM  GAD 7 : Generalized Anxiety Score  Nervous, Anxious, on Edge 1  Control/stop worrying 0  Worry too much - different things 0  Trouble relaxing 0  Restless 0  Easily annoyed or irritable 0  Afraid - awful might happen 0  Total GAD 7 Score 1  Anxiety Difficulty Not difficult at all       04/04/2022   10:16 AM 06/03/2017    9:52 AM  Depression screen PHQ 2/9  Decreased Interest 0 0  Down, Depressed, Hopeless 0 0  PHQ - 2 Score 0 0  Altered sleeping 0   Tired, decreased energy 0   Change in appetite 0   Feeling bad or failure about yourself  0   Trouble concentrating 0   Moving slowly or  fidgety/restless 0   Suicidal thoughts 0   PHQ-9 Score 0   Difficult doing work/chores Not difficult at all     BP Readings from Last 3 Encounters:  04/04/22 138/84  02/07/22 124/82  01/27/22 (!) 156/99    Physical Exam Vitals and nursing note reviewed.  Constitutional:      General: He is not in acute distress.    Appearance: He is well-developed.  HENT:     Head: Normocephalic and atraumatic.  Cardiovascular:     Rate and Rhythm: Normal rate and regular rhythm.     Pulses: Normal pulses.  Pulmonary:     Effort: Pulmonary effort is normal. No respiratory distress.     Breath sounds: No wheezing or rhonchi.  Musculoskeletal:     Cervical back: Normal range of motion.  Lymphadenopathy:     Cervical: No cervical adenopathy.  Skin:    General: Skin is warm and dry.     Capillary Refill: Capillary refill takes less than 2 seconds.     Findings: No rash.  Neurological:     General: No focal deficit present.     Mental Status: He is alert and oriented to person, place, and time.  Psychiatric:        Mood and Affect: Mood normal.        Behavior: Behavior normal.     Wt Readings from Last 3 Encounters:  04/04/22 197 lb (89.4 kg)  02/07/22 193 lb (87.5 kg)  01/27/22 192 lb (87.1 kg)    BP 138/84   Pulse 88   Ht '5\' 10"'$  (1.778 m)   Wt 197 lb (89.4 kg)   SpO2 98%   BMI 28.27 kg/m   Assessment and Plan: 1. Primary hypertension Clinically stable exam with well controlled BP. Tolerating medications without side effects at this time. Pt to continue current regimen and low sodium diet; benefits of regular exercise as able discussed. - CBC with Differential/Platelet - Comprehensive metabolic panel  2. Rotator cuff disorder, left Surgery clearance signed, faxed and a copy given to the patient.  3. Gastroesophageal reflux disease without esophagitis Symptoms well controlled on daily PPI No red flag signs such as weight loss, n/v, melena Will continue omeprazole. -  CBC with Differential/Platelet  4. Aneurysm of ascending aorta without rupture (HCC) CT done 10/2020 with finding of ectatic aorta; no aneurysm No follow recommended.  5. OSA (obstructive sleep apnea) He is encouraged to call his neurologist to let them know that he has not received the equipment that was ordered.  Note:  Patient called last week to schedule this appointment for clearance and was very agitated, angry and cursed the CMA over having to make this appointment.  I did let him know that cussing and threats are not tolerated in this office.  He apologized (and also called back last week to apologize) but also tried to deny the threat to me. He is now aware that any future inappropriate language or threats will result in dismissal from this practice.  Partially dictated using Editor, commissioning. Any errors are unintentional.  Halina Maidens, MD [facility]  04/04/2022

## 2022-04-05 LAB — CBC WITH DIFFERENTIAL/PLATELET
Basophils Absolute: 0 10*3/uL (ref 0.0–0.2)
Basos: 1 %
EOS (ABSOLUTE): 0.1 10*3/uL (ref 0.0–0.4)
Eos: 3 %
Hematocrit: 38.2 % (ref 37.5–51.0)
Hemoglobin: 12 g/dL — ABNORMAL LOW (ref 13.0–17.7)
Immature Grans (Abs): 0 10*3/uL (ref 0.0–0.1)
Immature Granulocytes: 0 %
Lymphocytes Absolute: 1.1 10*3/uL (ref 0.7–3.1)
Lymphs: 32 %
MCH: 27.8 pg (ref 26.6–33.0)
MCHC: 31.4 g/dL — ABNORMAL LOW (ref 31.5–35.7)
MCV: 88 fL (ref 79–97)
Monocytes Absolute: 0.3 10*3/uL (ref 0.1–0.9)
Monocytes: 9 %
Neutrophils Absolute: 2 10*3/uL (ref 1.4–7.0)
Neutrophils: 55 %
Platelets: 96 10*3/uL — CL (ref 150–450)
RBC: 4.32 x10E6/uL (ref 4.14–5.80)
RDW: 16.1 % — ABNORMAL HIGH (ref 11.6–15.4)
WBC: 3.5 10*3/uL (ref 3.4–10.8)

## 2022-04-05 LAB — COMPREHENSIVE METABOLIC PANEL
ALT: 22 IU/L (ref 0–44)
AST: 28 IU/L (ref 0–40)
Albumin/Globulin Ratio: 1.9 (ref 1.2–2.2)
Albumin: 4.4 g/dL (ref 3.8–4.9)
Alkaline Phosphatase: 145 IU/L — ABNORMAL HIGH (ref 44–121)
BUN/Creatinine Ratio: 8 — ABNORMAL LOW (ref 10–24)
BUN: 12 mg/dL (ref 8–27)
Bilirubin Total: 0.3 mg/dL (ref 0.0–1.2)
CO2: 22 mmol/L (ref 20–29)
Calcium: 8.9 mg/dL (ref 8.6–10.2)
Chloride: 109 mmol/L — ABNORMAL HIGH (ref 96–106)
Creatinine, Ser: 1.42 mg/dL — ABNORMAL HIGH (ref 0.76–1.27)
Globulin, Total: 2.3 g/dL (ref 1.5–4.5)
Glucose: 106 mg/dL — ABNORMAL HIGH (ref 70–99)
Potassium: 3.3 mmol/L — ABNORMAL LOW (ref 3.5–5.2)
Sodium: 145 mmol/L — ABNORMAL HIGH (ref 134–144)
Total Protein: 6.7 g/dL (ref 6.0–8.5)
eGFR: 57 mL/min/{1.73_m2} — ABNORMAL LOW (ref >59)

## 2022-04-06 DIAGNOSIS — M545 Low back pain, unspecified: Secondary | ICD-10-CM | POA: Diagnosis not present

## 2022-04-07 ENCOUNTER — Telehealth: Payer: Self-pay | Admitting: Internal Medicine

## 2022-04-07 NOTE — Progress Notes (Signed)
Pt. Need orders for surgery.

## 2022-04-07 NOTE — Patient Instructions (Signed)
DUE TO COVID-19 ONLY TWO VISITORS  (aged 60 and older)  ARE ALLOWED TO COME WITH YOU AND STAY IN THE WAITING ROOM ONLY DURING PRE OP AND PROCEDURE.   **NO VISITORS ARE ALLOWED IN THE SHORT STAY AREA OR RECOVERY ROOM!!**  IF YOU WILL BE ADMITTED INTO THE HOSPITAL YOU ARE ALLOWED ONLY FOUR SUPPORT PEOPLE DURING VISITATION HOURS ONLY (7 AM -8PM)   The support person(s) must pass our screening, gel in and out, and wear a mask at all times, including in the patient's room. Patients must also wear a mask when staff or their support person are in the room. Visitors GUEST BADGE MUST BE WORN VISIBLY  One adult visitor may remain with you overnight and MUST be in the room by 8 P.M.     Your procedure is scheduled on: 04/09/22   Report to [facility] Main Entrance    Report to admitting at : 8:30 AM   Call this number if you have problems the morning of surgery [phone]   Do not eat food :After Midnight.   After Midnight you may have the following liquids until: 7:30 AM DAY OF SURGERY  Water Black Coffee (sugar ok, NO MILK/CREAM OR CREAMERS)  Tea (sugar ok, NO MILK/CREAM OR CREAMERS) regular and decaf                             Plain Jell-O (NO RED)                                           Fruit ices (not with fruit pulp, NO RED)                                     Popsicles (NO RED)                                                                  Juice: apple, WHITE grape, WHITE cranberry Sports drinks like Gatorade (NO RED)   Oral Hygiene is also important to reduce your risk of infection.                                    Remember - BRUSH YOUR TEETH THE MORNING OF SURGERY WITH YOUR REGULAR TOOTHPASTE   Do NOT smoke after Midnight   Take these medicines the morning of surgery with A SIP OF WATER: lamotrigine,pregabalin,Seroquel.Use inhalers as usual.Tylenol,allopurinol,Flomax as needed.  DO NOT TAKE ANY ORAL DIABETIC MEDICATIONS DAY OF YOUR SURGERY  Bring CPAP mask and  tubing day of surgery.                              You may not have any metal on your body including hair pins, jewelry, and body piercing             Do not wear lotions, powders, perfumes/cologne, or deodorant  Men may shave face and neck.   Do not bring valuables to the hospital. [place].   Contacts, dentures or bridgework may not be worn into surgery.   Bring small overnight bag day of surgery.   DO NOT [place]. PHARMACY WILL DISPENSE MEDICATIONS LISTED ON YOUR MEDICATION LIST TO YOU DURING YOUR ADMISSION [place]!    Patients discharged on the day of surgery will not be allowed to drive home.  Someone NEEDS to stay with you for the first 24 hours after anesthesia.   Special Instructions: Bring a copy of your healthcare power of attorney and living will documents         the day of surgery if you haven't scanned them before.              Please read over the following fact sheets you were given: IF YOU HAVE QUESTIONS ABOUT YOUR PRE-OP INSTRUCTIONS PLEASE CALL [phone]     [facility] - Preparing for Surgery Before surgery, you can play an important role.  Because skin is not sterile, your skin needs to be as free of germs as possible.  You can reduce the number of germs on your skin by washing with CHG (chlorahexidine gluconate) soap before surgery.  CHG is an antiseptic cleaner which kills germs and bonds with the skin to continue killing germs even after washing. Please DO NOT use if you have an allergy to CHG or antibacterial soaps.  If your skin becomes reddened/irritated stop using the CHG and inform your nurse when you arrive at Short Stay. Do not shave (including legs and underarms) for at least 48 hours prior to the first CHG shower.  You may shave your face/neck. Please follow these instructions carefully:  1.  Shower with CHG Soap the night before surgery and the   morning of Surgery.  2.  If you choose to wash your hair, wash your hair first as usual with your  normal  shampoo.  3.  After you shampoo, rinse your hair and body thoroughly to remove the  shampoo.                           4.  Use CHG as you would any other liquid soap.  You can apply chg directly  to the skin and wash                       Gently with a scrungie or clean washcloth.  5.  Apply the CHG Soap to your body ONLY FROM THE NECK DOWN.   Do not use on face/ open                           Wound or open sores. Avoid contact with eyes, ears mouth and genitals (private parts).                       Wash face,  Genitals (private parts) with your normal soap.             6.  Wash thoroughly, paying special attention to the area where your surgery  will be performed.  7.  Thoroughly rinse your body with warm water from the neck down.  8.  DO NOT shower/wash with your normal soap after using and rinsing off  the CHG Soap.                9.  Pat yourself dry with a clean towel.            10.  Wear clean pajamas.            11.  Place clean sheets on your bed the night of your first shower and do not  sleep with pets. Day of Surgery : Do not apply any lotions/deodorants the morning of surgery.  Please wear clean clothes to the hospital/surgery center.  FAILURE TO FOLLOW THESE INSTRUCTIONS MAY RESULT IN THE CANCELLATION OF YOUR SURGERY PATIENT SIGNATURE_________________________________  NURSE SIGNATURE__________________________________  ________________________________________________________________________

## 2022-04-07 NOTE — Telephone Encounter (Signed)
Called, left vm with results. Pt has ROI on file.

## 2022-04-07 NOTE — Telephone Encounter (Signed)
Pt is calling back for lab results. Please advise CB- [phone]

## 2022-04-08 ENCOUNTER — Other Ambulatory Visit: Payer: Self-pay

## 2022-04-08 ENCOUNTER — Ambulatory Visit: Payer: Self-pay | Admitting: Orthopedic Surgery

## 2022-04-08 ENCOUNTER — Encounter (HOSPITAL_COMMUNITY): Payer: Self-pay

## 2022-04-08 ENCOUNTER — Encounter (HOSPITAL_COMMUNITY)
Admission: RE | Admit: 2022-04-08 | Discharge: 2022-04-08 | Disposition: A | Discharge status: Home / Self Care | Payer: Federal, State, Local not specified - PPO | Source: Ambulatory Visit | Attending: Specialist | Admitting: Specialist

## 2022-04-08 VITALS — BP 160/95 | HR 75 | Temp 98.0°F | Ht 70.0 in | Wt 195.0 lb

## 2022-04-08 DIAGNOSIS — R7303 Prediabetes: Secondary | ICD-10-CM | POA: Insufficient documentation

## 2022-04-08 DIAGNOSIS — I10 Essential (primary) hypertension: Secondary | ICD-10-CM | POA: Diagnosis not present

## 2022-04-08 DIAGNOSIS — Z01818 Encounter for other preprocedural examination: Secondary | ICD-10-CM | POA: Diagnosis not present

## 2022-04-08 HISTORY — DX: Post-traumatic stress disorder, unspecified: F43.10

## 2022-04-08 LAB — CBC
HCT: 38.9 % — ABNORMAL LOW (ref 39.0–52.0)
Hemoglobin: 11.9 g/dL — ABNORMAL LOW (ref 13.0–17.0)
MCH: 28.5 pg (ref 26.0–34.0)
MCHC: 30.6 g/dL (ref 30.0–36.0)
MCV: 93.1 fL (ref 80.0–100.0)
Platelets: 88 10*3/uL — ABNORMAL LOW (ref 150–400)
RBC: 4.18 MIL/uL — ABNORMAL LOW (ref 4.22–5.81)
RDW: 17.5 % — ABNORMAL HIGH (ref 11.5–15.5)
WBC: 3.2 10*3/uL — ABNORMAL LOW (ref 4.0–10.5)
nRBC: 0 % (ref 0.0–0.2)

## 2022-04-08 LAB — HEMOGLOBIN A1C
Hgb A1c MFr Bld: 5.4 % (ref 4.8–5.6)
Mean Plasma Glucose: 108.28 mg/dL

## 2022-04-08 LAB — GLUCOSE, CAPILLARY: Glucose-Capillary: 149 mg/dL — ABNORMAL HIGH (ref 70–99)

## 2022-04-08 MED ORDER — VANCOMYCIN HCL 10 G IV SOLR: 1000.0000 mg | INTRAVENOUS | Status: AC

## 2022-04-08 NOTE — Progress Notes (Addendum)
For Short Stay: [place] appointment date: Date of COVID positive in last 82 days:  Bowel Prep reminder:   For Anesthesia: PCP - Dr. Halina Maidens Cardiologist - Dr. Kathlyn [place]  Chest x-ray -  EKG -  Stress Test -  ECHO - 04/02/21 Cardiac Cath -  Pacemaker/ICD device last checked: Pacemaker orders received: Device Rep notified:  Spinal Cord Stimulator:  Sleep Study - Yes CPAP - NO  Fasting Blood Sugar - 100's Checks Blood Sugar __2___ times a day Date and result of last Hgb A1c- 5.7: 05/08/21  Blood Thinner Instructions: Aspirin Instructions: Last Dose:  Activity level: Can go up a flight of stairs and activities of daily living without stopping and without chest pain and/or shortness of breath   Able to exercise without chest pain and/or shortness of breath   Unable to go up a flight of stairs without chest pain and/or shortness of breath     Anesthesia review: Hx: HTN,OSA(NO CPAP),PE  Patient denies shortness of breath, fever, cough and chest pain at PAT appointment   Patient verbalized understanding of instructions that were given to them at the PAT appointment. Patient was also instructed that they will need to review over the PAT instructions again at home before surgery.

## 2022-04-08 NOTE — H&P (View-Only) (Signed)
Hector [name] is an 61 y.o. male.   Chief Complaint: Left shoulder pain HPI: Visit For: Follow up (to discuss MRI) Location: left; shoulder Duration: 8 1/2 months Severity: pain level 8/10 Timing: chronic Alleviating Factors: Oxycodone prescribed by Dr. Nelva Bush Prior Imaging: x ray; MRI Continued shoulder pain.  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    Anxiety    Ascending aortic aneurysm ([place])    a. 06/2018 CT: 4.1cm.   B12 nutritional deficiency    Bipolar disorder ([place])    Carpal tunnel syndrome 04/06/2007   Qualifier: Diagnosis of  By: Hulan Saas, CMA (AAMA), Larene Beach S    Chest pain    a. 2007 MV: No ischemia; b. 09/2018 recurrent c/p in setting of PE; c. 09/2018 MV: No ischemia/infarct. EF 55-65%.   Closed head injury    laceration  9 12  no loc  vision change   DDD (degenerative disc disease)    Depression    Diabetes mellitus    resolved with gastric bypass   GERD (gastroesophageal reflux disease)    no meds   Gout    Headache    once per week uses Indomethicin hx of cluster headaches    History of bunionectomy of left great toe    History of concussion    History of echocardiogram    a. 09/2018 Echo: Ef 65-70%, no rwma, mild MR. Ao root 2m (mildly dil).   History of kidney stones    History of renal stone    Seen in emergency room not urologist   Hyperlipidemia    Hypertension 02/27/2017   Neuromuscular disorder ([place]    Nonspecific pain in the lumbar region    OBESITY 12/10/2007   a. s/p gastric bypass.   OSA (obstructive sleep apnea) 12/12/2013   resolved by weight loss surgery 10/26/2009   Osteoarthritis of knee    Pain in left knee    Pancreatitis 03/21/2016   went to [place]   Perforated ulcer ([facility]    Surgical repair9/24 2011   Peripheral  neuropathy    PONV (postoperative nausea and vomiting)    PTSD (post-traumatic stress disorder)    Pulmonary embolism ([place]    a. 09/18/2018 CTA Chest: RLL segmental arterial branch small volume PE-->Eliquis.   remote Lumbar radiculopathy 10/01/2021    Past Surgical History:  Procedure Laterality Date   ABDOMINAL EXPOSURE N/A 03/29/2020   Procedure: ABDOMINAL EXPOSURE;  Surgeon: ERosetta Posner MD;  [facility]  Service: Vascular;  Laterality: N/A;   ANTERIOR LUMBAR FUSION N/A 03/29/2020   Procedure: ANTERIOR LUMBAR FUSION (ALIF) L4-5;  Surgeon: BMelina Schools MD;  [facility]  Service: Orthopedics;  Laterality: N/A;  5.5 hrs Dr. EDonnetta Hutchingdo approach Lt tap block with exparel   BACK SURGERY  2012   Dr. BDorina Hoyerdisk   CARPAL TUNNEL RELEASE  2008   left-ulnar nerve decomp   carpel tunnel Left    CHOLECYSTECTOMY N/A 03/21/2016   Procedure: LAPAROSCOPIC CHOLECYSTECTOMY WITH INTRAOPERATIVE CHOLANGIOGRAM;  Surgeon: ARalene Ok MD;  Location: [facility];  Service: General;  Laterality: N/A;   colonscopy      DEBRIDEMENT AND CLOSURE WOUND N/A 05/09/2020   Procedure: Irrigation and debridement of superficial wound closure;  Surgeon: BMelina Schools MD;  Location: [place];  Service: Orthopedics;  Laterality: N/A;  1 hr   DIAGNOSTIC LAPAROSCOPY     laparoscopic gastric bypass   EPIDURAL BLOCK INJECTION     by Dr. Nelva Bush   FOOT SURGERY     bilat for removal of extra bone    GASTRIC BYPASS  11/06/09   beginning weight 267   HARDWARE REMOVAL N/A 05/02/2016   Procedure: Removal of hardware Lumbar five-Sacral one with Metrex;  Surgeon: Kristeen Miss, MD;  [facility];  Service: Neurosurgery;  Laterality: N/A;  Removal of hardware L5-S1 with Metrex   HERNIA REPAIR  2979   umbilical   HIATAL HERNIA REPAIR N/A 01/20/2013   Procedure: LAPAROSCOPIC REPAIR OF INTERNAL HERNIA;  Surgeon: Shann Medal, MD;  Location: [facility];  Service: General;  Laterality: N/A;   LUMBAR  LAMINECTOMY/DECOMPRESSION MICRODISCECTOMY Left 06/07/2014   Procedure: MICRO LUMBAR DECOMPRESSION L5 - S1 ON THE LEFT/L5 FORAMINOTOMY/EXCISION SYNOVIAL CYST  1 LEVEL;  Surgeon: [name], MD;  Location: [facility];  Service: Orthopedics;  Laterality: Left;   MASS EXCISION Right 04/18/2013   Procedure: RIGHT SMALL FINGER SKIN LESION EXCISION;  Surgeon: Jolyn Nap, MD;  Location: [place];  Service: Orthopedics;  Laterality: Right;   NASAL SEPTOPLASTY W/ TURBINOPLASTY     x 2   NEPHROLITHOTOMY Right 11/03/2014   Procedure: RIGHT PERCUTANEOUS NEPHROLITHOTOMY;  Surgeon: Claybon Jabs, MD;  Location: [facility];  Service: Urology;  Laterality: Right;   SHOULDER ARTHROSCOPY  2014   rt   surgerical repair of stomach ulcer  06/08/10   per   TONSILLECTOMY     UVULOPALATOPHARYNGOPLASTY  2007   with tonsils   VASECTOMY      Family History  Problem Relation Age of Onset   Uterine cancer Mother    Hypertension Mother    Cervical cancer Mother    Heart disease Father    COPD Father    Seizures Father    Arthritis Father    Arthritis Sister    Diabetes Sister    Colon cancer Maternal Uncle    Colon cancer Paternal Aunt    Diabetes Paternal Aunt    Heart disease Maternal Grandmother    Heart disease Maternal Grandfather    Heart disease Paternal Grandmother    Heart disease Paternal Grandfather    Diabetes Paternal Uncle    Social History:  reports that he has never smoked. He has never used smokeless tobacco. He reports current alcohol use. He reports that he does not use drugs.  Allergies:  Allergies  Allergen Reactions   Aspirin Other (See Comments)    perforated ulcer   Cymbalta [Duloxetine Hcl] Other (See Comments)    Over-sedating; slept over 24 hours with one dose.  Possible serotonin syndrome.   Nsaids Other (See Comments)    Perforated ulcer   Tape Rash    Surgical tape   Hydrocodone Other (See Comments)    headache   Vancomycin Rash    Red man  syndrome.  Please premedicate.    Current meds: acetaminophen 500 mg tablet albuterol sulfate HFA 90 mcg/actuation aerosol inhaler allopurinoL 100 mg tablet celecoxib 200 mg capsule chlorhexidine gluconate 4 % topical liquid colchicine (gout) 0.6 mg tablet diazePAM 5 mg tablet escitalopram 20 mg tablet folic acid 1 mg tablet furosemide 20 mg tablet indomethacin 50 mg capsule lamoTRIgine 100 mg tablet meloxicam 15 mg tablet methocarbamoL 500 mg tablet mupirocin 2 % topical ointment omeprazole 40 mg capsule,delayed  release ondansetron HCL 4 mg tablet oxyCODONE-acetaminophen 10 mg-325 mg tablet pregabalin 150 mg capsule QUEtiapine 50 mg tablet tamsulosin 0.4 mg capsule traZODone 100 mg tablet zinc gluconate 50 mg tablet   Results for orders placed or performed during the hospital encounter of 04/08/22 (from the past 48 hour(s))  Glucose, capillary     Status: Abnormal   Collection Time: 04/08/22  8:28 AM  Result Value Ref Range   Glucose-Capillary 149 (H) 70 - 99 mg/dL    Comment: Glucose reference range applies only to samples taken after fasting for at least 8 hours.  CBC per protocol     Status: Abnormal   Collection Time: 04/08/22  8:40 AM  Result Value Ref Range   WBC 3.2 (L) 4.0 - 10.5 K/uL   RBC 4.18 (L) 4.22 - 5.81 MIL/uL   Hemoglobin 11.9 (L) 13.0 - 17.0 g/dL   HCT 38.9 (L) 39.0 - 52.0 %   MCV 93.1 80.0 - 100.0 fL   MCH 28.5 26.0 - 34.0 pg   MCHC 30.6 30.0 - 36.0 g/dL   RDW 17.5 (H) 11.5 - 15.5 %   Platelets 88 (L) 150 - 400 K/uL    Comment: SPECIMEN CHECKED FOR CLOTS Immature Platelet Fraction may be clinically indicated, consider ordering this additional test [id number] REPEATED TO VERIFY PLATELET COUNT CONFIRMED BY SMEAR    nRBC 0.0 0.0 - 0.2 %    Comment: Performed at [facility], [place] [address] [place], [place] [address]  Hemoglobin A1c     Status: None   Collection Time: 04/08/22  8:48 AM  Result Value Ref Range   Hgb A1c MFr  Bld 5.4 4.8 - 5.6 %    Comment: (NOTE) Pre diabetes:          5.7%-6.4%  Diabetes:              >6.4%  Glycemic control for   <7.0% adults with diabetes    Mean Plasma Glucose 108.28 mg/dL    Comment: Performed at [facility] [address] [place], [place] [address]   No results found.  Review of Systems  Constitutional: Negative.   HENT: Negative.    Eyes: Negative.   Respiratory: Negative.    Cardiovascular: Negative.   Gastrointestinal: Negative.   Endocrine: Negative.   Genitourinary: Negative.   Musculoskeletal:  Positive for arthralgias, back pain, joint swelling and myalgias.  Allergic/Immunologic: Negative.   Neurological: Negative.   Psychiatric/Behavioral: Negative.      There were no vitals taken for this visit. Physical Exam Constitutional:      Appearance: Normal appearance.  HENT:     Head: Normocephalic and atraumatic.     Right Ear: External ear normal.     Left Ear: External ear normal.     Nose: Nose normal.     Mouth/Throat:     Mouth: Mucous membranes are dry.  Eyes:     Pupils: Pupils are equal, round, and reactive to light.  Cardiovascular:     Rate and Rhythm: Normal rate and regular rhythm.     Pulses: Normal pulses.  Pulmonary:     Effort: Pulmonary effort is normal.  Abdominal:     General: Bowel sounds are normal.  Musculoskeletal:     Cervical back: Normal range of motion.     Comments: Constitutional: General Appearance: healthy-appearing and NAD.  Psychiatric: Mood and Affect: normal mood and affect.  Cardiovascular System: Arterial Pulses Left: radial normal and brachial normal. Edema  Left: none. Varicosities Right: no varicosities. Varicosities Left: no varicosities.  C-Spine/Neck: Active Range of Motion: flexion normal, extension normal, and no pain elicited on motion.  Shoulders: Inspection Left: no misalignment, atrophy, erythema, swelling, or scapular winging. Bony Palpation Left: no tenderness of the  sternoclavicular joint, the coracoid process, the acromioclavicular joint, the bicipital groove, or the scapula. Soft Tissue Palpation Left: no tenderness of the infraspinatus, the teres minor, the subacromial bursa, the axilla, the glenohumeral joint region, the pectoralis major insertion, the sternocleidomastoid, the costochondral junction, the trapezius, the rhomboid, the latissimus dorsi, the serratus, the deltoid, the levator scapulae, or the lateral cuff insertion and tenderness of the supraspinatus and the subdeltoid bursa. Active Range of Motion Left: limited. Special Tests Left: Speed's test negative and Neer's test positive. Stability Left: no laxity, sulcus sign negative, and anterior apprehension test negative. Strength Left: abduction 5/5, adduction 5/5, flexion 5/5, and extension 5/5.  Skin: Left Upper Extremity: normal.  Neurological System: Biceps Reflex Left: normal (2). Brachioradialis Reflex Left: normal (2). Triceps Reflex Left: normal (2). Sensation on the Left: C5 normal, C6 normal, and C7 normal.  Skin:    General: Skin is warm and dry.  Neurological:     Mental Status: He is alert.    MRI of the shoulder demonstrates a high-grade tear of the supraspinatus 17 x 16 mm. Severe tendinosis. A new area of 20 x 25 mm region of avascular necrosis involving the superior articulating process of the humeral head with surrounding edema no articular collapse. Previous greater tuberosity fracture is healed. Mild AC arthrosis with a small inferiorly pointing distal clavicle osteophyte. Moderate effusion  Assessment/Plan  Impression:  1. Status post healed greater tuberosity fracture with high-grade tear of the supraspinatus symptomatic 2. 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 Inferiorly projected clavicular spur  Plan:  We discussed options including observation and conservative treatment he feels is unable to tolerate continued conservative  treatment.  We discussed left shoulder arthroscopy subacromial decompression possible clavicular plasty and mini open rotator cuff repair. This may require bone grafting or subchondroplasty of the humeral head. This may require autograft or allograft. We discussed this including the risk and benefits An extensive discussion concerning the pathology relevant anatomy and treatment options. After that discussion we mutually agreed to proceed with repair of the rotator cuff utilizing arthroscopic assistance if possible. The risks and benefits of that procedure were discussed including bleeding, infection, suboptimal range of motion, deep venous thrombosis, pulmonary embolism, anesthetic complications etc. in addition we discussed the postoperative course to include approximately 4 weeks of passive range of motion followed by 4 weeks of active range of motion followed by 4-12 weeks of progressive strengthening exercises. In addition we discussed protective activities to reduce the risk of a reinjury including impingement activities with elbow above the shoulder as well as reaching and repetitive circular motion activities. The hospital stay will either be as a outpatient with a regional block versus overnight depending upon the extent of the procedure and any challenging health issues with a first postoperative visit 2 weeks following the surgery.   History of MRSA exposure. We will use vancomycin and Kefzol as well as preoperative mupirocin and chlorhexidine.  Kefzol and Dilaudid. Probable vancomycin although he reports a rash that is mild to it.  Plan L shoulder scope, SAD, mini-open RCR, possible DCR and bone grafting humeral head with autograft and allograft bone  Cecilie Kicks, PA-C for Dr Tonita Cong 04/08/2022, 1:16 PM

## 2022-04-08 NOTE — H&P (Signed)
Hector [name] is an 61 y.o. male.   Chief Complaint: Left shoulder pain HPI: Visit For: Follow up (to discuss MRI) Location: left; shoulder Duration: 8 1/2 months Severity: pain level 8/10 Timing: chronic Alleviating Factors: Oxycodone prescribed by Dr. Nelva Bush Prior Imaging: x ray; MRI Continued shoulder pain.  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    Anxiety    Ascending aortic aneurysm ([place])    a. 06/2018 CT: 4.1cm.   B12 nutritional deficiency    Bipolar disorder ([place])    Carpal tunnel syndrome 04/06/2007   Qualifier: Diagnosis of  By: Hulan Saas, CMA (AAMA), Larene Beach S    Chest pain    a. 2007 MV: No ischemia; b. 09/2018 recurrent c/p in setting of PE; c. 09/2018 MV: No ischemia/infarct. EF 55-65%.   Closed head injury    laceration  9 12  no loc  vision change   DDD (degenerative disc disease)    Depression    Diabetes mellitus    resolved with gastric bypass   GERD (gastroesophageal reflux disease)    no meds   Gout    Headache    once per week uses Indomethicin hx of cluster headaches    History of bunionectomy of left great toe    History of concussion    History of echocardiogram    a. 09/2018 Echo: Ef 65-70%, no rwma, mild MR. Ao root 47m (mildly dil).   History of kidney stones    History of renal stone    Seen in emergency room not urologist   Hyperlipidemia    Hypertension 02/27/2017   Ne[place]romuscular disorder ([place]    Nonspecific pain in the lumbar region    OBESITY 12/10/2007   a. s/p gastric bypass.   OSA (obstructive sleep apnea) 12/12/2013   resolved by weight loss surgery 10/26/2009   Osteoarthritis of knee    Pain in left knee    Pancreatitis 03/21/2016   went to [place]   Perforated ulcer ([facility]    Surgical repair9/24 2011   Peripheral  neuropathy    PONV (postoperative nausea and vomiting)    PTSD (post-traumatic stress disorder)    [place]ulmonary embolism ([place]    a. 09/18/2018 CTA Chest: RLL segmental arterial branch small volume PE-->Eliquis.   remote Lumbar radiculopathy 10/01/2021    Past Surgical History:  Procedure Laterality Date   ABDOMINAL EXPOSURE N/A 03/29/2020   Procedure: ABDOMINAL EXPOSURE;  Surgeon: ERosetta Posner MD;  [facility]  Service: Vascular;  Laterality: N/A;   ANTERIOR LUMBAR FUSION N/A 03/29/2020   Procedure: ANTERIOR LUMBAR FUSION (ALIF) L4-5;  Surgeon: BMelina Schools MD;  [place] [facility]  Service: Orthopedics;  Laterality: N/A;  5.5 hrs Dr. EDonnetta Hutchingdo approach Lt tap block with exparel   BACK SURGERY  2012   Dr. BDorina Hoyerdisk   CARPAL TUNNEL RELEASE  2008   left-ulnar nerve decomp   carpel tunnel Left    CHOLECYSTECTOMY N/A 03/21/2016   Procedure: LAPAROSCOPIC CHOLECYSTECTOMY WITH INTRAOPERATIVE CHOLANGIOGRAM;  Surgeon: ARalene Ok MD;  Location: [facility];  Service: General;  Laterality: N/A;   colonscopy      DEBRIDEMENT AND CLOSURE WOUND N/A 05/09/2020   Procedure: Irrigation and debridement of superficial wound closure;  Surgeon: BMelina Schools MD;  Location: [place];  Service: Orthopedics;  Laterality: N/A;  1 hr   DIAGNOSTIC LAPAROSCOPY     laparoscopic gastric bypass   EPIDURAL BLOCK INJECTION     by Dr. Nelva Bush   FOOT SURGERY     bilat for removal of extra bone    GASTRIC BYPASS  11/06/09   beginning weight 267   HARDWARE REMOVAL N/A 05/02/2016   Procedure: Removal of hardware Lumbar five-Sacral one with Metrex;  Surgeon: Kristeen Miss, MD;  [facility];  Service: Neurosurgery;  Laterality: N/A;  Removal of hardware L5-S1 with Metrex   HERNIA REPAIR  0938   umbilical   HIATAL HERNIA REPAIR N/A 01/20/2013   Procedure: LAPAROSCOPIC REPAIR OF INTERNAL HERNIA;  Surgeon: Shann Medal, MD;  Location: [facility];  Service: General;  Laterality: N/A;   LUMBAR  LAMINECTOMY/DECOMPRESSION MICRODISCECTOMY Left 06/07/2014   Procedure: MICRO LUMBAR DECOMPRESSION L5 - S1 ON THE LEFT/L5 FORAMINOTOMY/EXCISION SYNOVIAL CYST  1 LEVEL;  Surgeon: [name], MD;  Location: [facility];  Service: Orthopedics;  Laterality: Left;   MASS EXCISION Right 04/18/2013   Procedure: RIGHT SMALL FINGER SKIN LESION EXCISION;  Surgeon: Jolyn Nap, MD;  Location: [place];  Service: Orthopedics;  Laterality: Right;   NASAL SEPTOPLASTY W/ TURBINOPLASTY     x 2   NEPHROLITHOTOMY Right 11/03/2014   Procedure: RIGHT PERCUTANEOUS NEPHROLITHOTOMY;  Surgeon: Claybon Jabs, MD;  Location[facility];  Service: Urology;  Laterality: Right;   SHOULDER ARTHROSCOPY  2014   rt   surgerical repair of stomach ulcer  06/08/10   per   TONSILLECTOMY     UVULOPALATOPHARYNGOPLASTY  2007   with tonsils   VASECTOMY      Family History  Problem Relation Age of Onset   Uterine cancer Mother    Hypertension Mother    Cervical cancer Mother    Heart disease Father    COPD Father    Seizures Father    Arthritis Father    Arthritis Sister    Diabetes Sister    Colon cancer Maternal Uncle    Colon cancer Paternal Aunt    Diabetes Paternal Aunt    Heart disease Maternal Grandmother    Heart disease Maternal Grandfather    Heart disease Paternal Grandmother    Heart disease Paternal Grandfather    Diabetes Paternal Uncle    Social History:  reports that he has never smoked. He has never used smokeless tobacco. He reports current alcohol use. He reports that he does not use drugs.  Allergies:  Allergies  Allergen Reactions   Aspirin Other (See Comments)    perforated ulcer   Cymbalta [Duloxetine Hcl] Other (See Comments)    Over-sedating; slept over 24 hours with one dose.  Possible serotonin syndrome.   Nsaids Other (See Comments)    Perforated ulcer   Tape Rash    Surgical tape   Hydrocodone Other (See Comments)    headache   Vancomycin Rash    Red man  syndrome.  Please premedicate.    Current meds: acetaminophen 500 mg tablet albuterol sulfate HFA 90 mcg/actuation aerosol inhaler allopurinoL 100 mg tablet celecoxib 200 mg capsule chlorhexidine gluconate 4 % topical liquid colchicine (gout) 0.6 mg tablet diazePAM 5 mg tablet escitalopram 20 mg tablet folic acid 1 mg tablet furosemide 20 mg tablet indomethacin 50 mg capsule lamoTRIgine 100 mg tablet meloxicam 15 mg tablet methocarbamoL 500 mg tablet mupirocin 2 % topical ointment omeprazole 40 mg capsule,delayed  release ondansetron HCL 4 mg tablet oxyCODONE-acetaminophen 10 mg-325 mg tablet pregabalin 150 mg capsule QUEtiapine 50 mg tablet tamsulosin 0.4 mg capsule traZODone 100 mg tablet zinc gluconate 50 mg tablet   Results for orders placed or performed during the hospital encounter of 04/08/22 (from the past 48 hour(s))  Glucose, capillary     Status: Abnormal   Collection Time: 04/08/22  8:28 AM  Result Value Ref Range   Glucose-Capillary 149 (H) 70 - 99 mg/dL    Comment: Glucose reference range applies only to samples taken after fasting for at least 8 hours.  CBC per protocol     Status: Abnormal   Collection Time: 04/08/22  8:40 AM  Result Value Ref Range   WBC 3.2 (L) 4.0 - 10.5 K/uL   RBC 4.18 (L) 4.22 - 5.81 MIL/uL   Hemoglobin 11.9 (L) 13.0 - 17.0 g/dL   HCT 38.9 (L) 39.0 - 52.0 %   MCV 93.1 80.0 - 100.0 fL   MCH 28.5 26.0 - 34.0 pg   MCHC 30.6 30.0 - 36.0 g/dL   RDW 17.5 (H) 11.5 - 15.5 %   Platelets 88 (L) 150 - 400 K/uL    Comment: SPECIMEN CHECKED FOR CLOTS Immature Platelet Fraction may be clinically indicated, consider ordering this additional test [id number] REPEATED TO VERIFY PLATELET COUNT CONFIRMED BY SMEAR    nRBC 0.0 0.0 - 0.2 %    Comment: Performed at [facility], [place] [address], [place], [place] [address]  Hemoglobin A1c     Status: None   Collection Time: 04/08/22  8:48 AM  Result Value Ref Range   Hgb A1c MFr  Bld 5.4 4.8 - 5.6 %    Comment: (NOTE) Pre diabetes:          5.7%-6.4%  Diabetes:              >6.4%  Glycemic control for   <7.0% adults with diabetes    Mean Plasma Glucose 108.28 mg/dL    Comment: Performed at [facility] [address] [place], [place] [address]   No results found.  Review of Systems  Constitutional: Negative.   HENT: Negative.    Eyes: Negative.   Respiratory: Negative.    Cardiovascular: Negative.   Gastrointestinal: Negative.   Endocrine: Negative.   Genitourinary: Negative.   Musculoskeletal:  Positive for arthralgias, back pain, joint swelling and myalgias.  Allergic/Immunologic: Negative.   Neurological: Negative.   Psychiatric/Behavioral: Negative.      There were no vitals taken for this visit. Physical Exam Constitutional:      Appearance: Normal appearance.  HENT:     Head: Normocephalic and atraumatic.     Right Ear: External ear normal.     Left Ear: External ear normal.     Nose: Nose normal.     Mouth/Throat:     Mouth: Mucous membranes are dry.  Eyes:     Pupils: Pupils are equal, round, and reactive to light.  Cardiovascular:     Rate and Rhythm: Normal rate and regular rhythm.     Pulses: Normal pulses.  Pulmonary:     Effort: Pulmonary effort is normal.  Abdominal:     General: Bowel sounds are normal.  Musculoskeletal:     Cervical back: Normal range of motion.     Comments: Constitutional: General Appearance: healthy-appearing and NAD.  Psychiatric: Mood and Affect: normal mood and affect.  Cardiovascular System: Arterial Pulses Left: radial normal and brachial normal. Edema  Left: none. Varicosities Right: no varicosities. Varicosities Left: no varicosities.  C-Spine/Neck: Active Range of Motion: flexion normal, extension normal, and no pain elicited on motion.  Shoulders: Inspection Left: no misalignment, atrophy, erythema, swelling, or scapular winging. Bony Palpation Left: no tenderness of the  sternoclavicular joint, the coracoid process, the acromioclavicular joint, the bicipital groove, or the scapula. Soft Tissue Palpation Left: no tenderness of the infraspinatus, the teres minor, the subacromial bursa, the axilla, the glenohumeral joint region, the pectoralis major insertion, the sternocleidomastoid, the costochondral junction, the trapezius, the rhomboid, the latissimus dorsi, the serratus, the deltoid, the levator scapulae, or the lateral cuff insertion and tenderness of the supraspinatus and the subdeltoid bursa. Active Range of Motion Left: limited. Special Tests Left: Speed's test negative and Neer's test positive. Stability Left: no laxity, sulcus sign negative, and anterior apprehension test negative. Strength Left: abduction 5/5, adduction 5/5, flexion 5/5, and extension 5/5.  Skin: Left Upper Extremity: normal.  Neurological System: Biceps Reflex Left: normal (2). Brachioradialis Reflex Left: normal (2). Triceps Reflex Left: normal (2). Sensation on the Left: C5 normal, C6 normal, and C7 normal.  Skin:    General: Skin is warm and dry.  Neurological:     Mental Status: He is alert.    MRI of the shoulder demonstrates a high-grade tear of the supraspinatus 17 x 16 mm. Severe tendinosis. A new area of 20 x 25 mm region of avascular necrosis involving the superior articulating process of the humeral head with surrounding edema no articular collapse. Previous greater tuberosity fracture is healed. Mild AC arthrosis with a small inferiorly pointing distal clavicle osteophyte. Moderate effusion  Assessment/Plan  Impression:  1. Status post healed greater tuberosity fracture with high-grade tear of the supraspinatus symptomatic 2. 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 Inferiorly projected clavicular spur  Plan:  We discussed options including observation and conservative treatment he feels is unable to tolerate continued conservative  treatment.  We discussed left shoulder arthroscopy subacromial decompression possible clavicular plasty and mini open rotator cuff repair. This may require bone grafting or subchondroplasty of the humeral head. This may require autograft or allograft. We discussed this including the risk and benefits An extensive discussion concerning the pathology relevant anatomy and treatment options. After that discussion we mutually agreed to proceed with repair of the rotator cuff utilizing arthroscopic assistance if possible. The risks and benefits of that procedure were discussed including bleeding, infection, suboptimal range of motion, deep venous thrombosis, pulmonary embolism, anesthetic complications etc. in addition we discussed the postoperative course to include approximately 4 weeks of passive range of motion followed by 4 weeks of active range of motion followed by 4-12 weeks of progressive strengthening exercises. In addition we discussed protective activities to reduce the risk of a reinjury including impingement activities with elbow above the shoulder as well as reaching and repetitive circular motion activities. The hospital stay will either be as a outpatient with a regional block versus overnight depending upon the extent of the procedure and any challenging health issues with a first postoperative visit 2 weeks following the surgery.   History of MRSA exposure. We will use vancomycin and Kefzol as well as preoperative mupirocin and chlorhexidine.  Kefzol and Dilaudid. Probable vancomycin although he reports a rash that is mild to it.  Plan L shoulder scope, SAD, mini-open RCR, possible DCR and bone grafting humeral head with autograft and allograft bone  Cecilie Kicks, PA-C for Dr Tonita Cong 04/08/2022, 1:16 PM

## 2022-04-09 ENCOUNTER — Ambulatory Visit (HOSPITAL_COMMUNITY): Payer: Federal, State, Local not specified - PPO | Admitting: Anesthesiology

## 2022-04-09 ENCOUNTER — Encounter (HOSPITAL_COMMUNITY)
Admission: RE | Disposition: A | Discharge status: Home / Self Care | Payer: Self-pay | Source: Home / Self Care | Attending: Specialist

## 2022-04-09 ENCOUNTER — Other Ambulatory Visit: Payer: Self-pay

## 2022-04-09 ENCOUNTER — Ambulatory Visit (HOSPITAL_COMMUNITY)
Admission: RE | Admit: 2022-04-09 | Discharge: 2022-04-10 | Disposition: A | Discharge status: Home / Self Care | Payer: Federal, State, Local not specified - PPO | Attending: Specialist | Admitting: Specialist

## 2022-04-09 ENCOUNTER — Encounter (HOSPITAL_COMMUNITY): Payer: Self-pay | Admitting: Specialist

## 2022-04-09 DIAGNOSIS — I10 Essential (primary) hypertension: Secondary | ICD-10-CM | POA: Diagnosis not present

## 2022-04-09 DIAGNOSIS — M75102 Unspecified rotator cuff tear or rupture of left shoulder, not specified as traumatic: Secondary | ICD-10-CM | POA: Diagnosis not present

## 2022-04-09 DIAGNOSIS — M75122 Complete rotator cuff tear or rupture of left shoulder, not specified as traumatic: Secondary | ICD-10-CM | POA: Diagnosis not present

## 2022-04-09 DIAGNOSIS — M7542 Impingement syndrome of left shoulder: Secondary | ICD-10-CM | POA: Diagnosis not present

## 2022-04-09 DIAGNOSIS — M19012 Primary osteoarthritis, left shoulder: Secondary | ICD-10-CM | POA: Insufficient documentation

## 2022-04-09 DIAGNOSIS — S43432A Superior glenoid labrum lesion of left shoulder, initial encounter: Secondary | ICD-10-CM | POA: Diagnosis not present

## 2022-04-09 DIAGNOSIS — G4733 Obstructive sleep apnea (adult) (pediatric): Secondary | ICD-10-CM | POA: Diagnosis not present

## 2022-04-09 DIAGNOSIS — G8918 Other acute postprocedural pain: Secondary | ICD-10-CM | POA: Diagnosis not present

## 2022-04-09 DIAGNOSIS — S43402A Unspecified sprain of left shoulder joint, initial encounter: Secondary | ICD-10-CM | POA: Diagnosis not present

## 2022-04-09 DIAGNOSIS — F319 Bipolar disorder, unspecified: Secondary | ICD-10-CM | POA: Diagnosis not present

## 2022-04-09 DIAGNOSIS — Z79899 Other long term (current) drug therapy: Secondary | ICD-10-CM | POA: Diagnosis not present

## 2022-04-09 DIAGNOSIS — M879 Osteonecrosis, unspecified: Secondary | ICD-10-CM | POA: Diagnosis not present

## 2022-04-09 DIAGNOSIS — M75120 Complete rotator cuff tear or rupture of unspecified shoulder, not specified as traumatic: Secondary | ICD-10-CM

## 2022-04-09 DIAGNOSIS — E1142 Type 2 diabetes mellitus with diabetic polyneuropathy: Secondary | ICD-10-CM | POA: Insufficient documentation

## 2022-04-09 DIAGNOSIS — F419 Anxiety disorder, unspecified: Secondary | ICD-10-CM | POA: Diagnosis not present

## 2022-04-09 DIAGNOSIS — K219 Gastro-esophageal reflux disease without esophagitis: Secondary | ICD-10-CM | POA: Diagnosis not present

## 2022-04-09 DIAGNOSIS — X58XXXA Exposure to other specified factors, initial encounter: Secondary | ICD-10-CM | POA: Insufficient documentation

## 2022-04-09 DIAGNOSIS — S46012A Strain of muscle(s) and tendon(s) of the rotator cuff of left shoulder, initial encounter: Secondary | ICD-10-CM | POA: Diagnosis not present

## 2022-04-09 HISTORY — PX: SHOULDER ARTHROSCOPY WITH ROTATOR CUFF REPAIR AND SUBACROMIAL DECOMPRESSION: SHX5686

## 2022-04-09 HISTORY — DX: Complete rotator cuff tear or rupture of unspecified shoulder, not specified as traumatic: M75.120

## 2022-04-09 LAB — GLUCOSE, CAPILLARY: Glucose-Capillary: 131 mg/dL — ABNORMAL HIGH (ref 70–99)

## 2022-04-09 SURGERY — SHOULDER ARTHROSCOPY WITH ROTATOR CUFF REPAIR AND SUBACROMIAL DECOMPRESSION
Anesthesia: General | Laterality: Left

## 2022-04-09 MED ORDER — BUPIVACAINE-EPINEPHRINE (PF) 0.5% -1:200000 IJ SOLN
INTRAMUSCULAR | Status: AC
  Filled 2022-04-09: qty 30

## 2022-04-09 MED ORDER — HYDROMORPHONE HCL 1 MG/ML IJ SOLN
INTRAMUSCULAR | Status: AC
  Filled 2022-04-09: qty 2

## 2022-04-09 MED ORDER — MIDAZOLAM HCL 2 MG/2ML IJ SOLN
1.0000 mg | Freq: Once | INTRAMUSCULAR | Status: AC
  Administered 2022-04-09: 2 mg via INTRAVENOUS
  Filled 2022-04-09: qty 2

## 2022-04-09 MED ORDER — ZINC GLUCONATE 50 MG PO TABS: 50.0000 mg | ORAL_TABLET | Freq: Every day | ORAL | Status: DC

## 2022-04-09 MED ORDER — PHENYLEPHRINE HCL-NACL 20-0.9 MG/250ML-% IV SOLN
INTRAVENOUS | Status: AC
  Filled 2022-04-09: qty 500

## 2022-04-09 MED ORDER — EPINEPHRINE PF 1 MG/ML IJ SOLN
INTRAMUSCULAR | Status: AC
  Filled 2022-04-09: qty 1

## 2022-04-09 MED ORDER — RISAQUAD PO CAPS
1.0000 | ORAL_CAPSULE | Freq: Every day | ORAL | Status: DC
  Filled 2022-04-09: qty 1

## 2022-04-09 MED ORDER — CHLORHEXIDINE GLUCONATE 0.12 % MT SOLN
15.0000 mL | Freq: Once | OROMUCOSAL | Status: AC
  Administered 2022-04-09: 15 mL via OROMUCOSAL

## 2022-04-09 MED ORDER — TAMSULOSIN HCL 0.4 MG PO CAPS
0.4000 mg | ORAL_CAPSULE | Freq: Every day | ORAL | Status: DC | PRN
Start: 2022-04-09 — End: 2022-04-10
  Filled 2022-04-09: qty 1

## 2022-04-09 MED ORDER — TRAZODONE HCL 100 MG PO TABS
300.0000 mg | ORAL_TABLET | Freq: Every evening | ORAL | Status: DC | PRN
Start: 2022-04-09 — End: 2022-04-10

## 2022-04-09 MED ORDER — PHENOL 1.4 % MT LIQD
1.0000 | OROMUCOSAL | Status: DC | PRN
Start: 2022-04-09 — End: 2022-04-10

## 2022-04-09 MED ORDER — PHENYLEPHRINE 80 MCG/ML (10ML) SYRINGE FOR IV PUSH (FOR BLOOD PRESSURE SUPPORT)
PREFILLED_SYRINGE | INTRAVENOUS | Status: AC
  Filled 2022-04-09: qty 10

## 2022-04-09 MED ORDER — METOCLOPRAMIDE HCL 5 MG/ML IJ SOLN: 5.0000 mg | Freq: Three times a day (TID) | INTRAMUSCULAR | Status: DC | PRN

## 2022-04-09 MED ORDER — LAMOTRIGINE 25 MG PO TABS
150.0000 mg | ORAL_TABLET | Freq: Two times a day (BID) | ORAL | Status: DC
  Administered 2022-04-09 – 2022-04-10 (×2): 150 mg via ORAL
  Filled 2022-04-09 (×2): qty 2

## 2022-04-09 MED ORDER — 0.9 % SODIUM CHLORIDE (POUR BTL) OPTIME
TOPICAL | Status: DC | PRN
  Administered 2022-04-09: 1000 mL

## 2022-04-09 MED ORDER — SUGAMMADEX SODIUM 200 MG/2ML IV SOLN
INTRAVENOUS | Status: DC | PRN
  Administered 2022-04-09: 200 mg via INTRAVENOUS

## 2022-04-09 MED ORDER — ONDANSETRON HCL 4 MG/2ML IJ SOLN
INTRAMUSCULAR | Status: DC | PRN
  Administered 2022-04-09: 4 mg via INTRAVENOUS

## 2022-04-09 MED ORDER — HYDROMORPHONE HCL 2 MG PO TABS
ORAL_TABLET | ORAL | Status: AC
  Filled 2022-04-09: qty 1

## 2022-04-09 MED ORDER — METOCLOPRAMIDE HCL 5 MG PO TABS: 5.0000 mg | ORAL_TABLET | Freq: Three times a day (TID) | ORAL | Status: DC | PRN

## 2022-04-09 MED ORDER — METHOCARBAMOL 500 MG IVPB - SIMPLE MED: 500.0000 mg | Freq: Four times a day (QID) | INTRAVENOUS | Status: DC | PRN

## 2022-04-09 MED ORDER — OXYCODONE-ACETAMINOPHEN 5-325 MG PO TABS
ORAL_TABLET | ORAL | Status: AC
  Filled 2022-04-09: qty 1

## 2022-04-09 MED ORDER — FOLIC ACID 1 MG PO TABS
1.0000 mg | ORAL_TABLET | Freq: Every day | ORAL | Status: DC
  Administered 2022-04-10: 1 mg via ORAL
  Filled 2022-04-09: qty 1

## 2022-04-09 MED ORDER — OXYCODONE-ACETAMINOPHEN 5-325 MG PO TABS
1.0000 | ORAL_TABLET | Freq: Four times a day (QID) | ORAL | Status: DC
  Administered 2022-04-09 – 2022-04-10 (×3): 1 via ORAL
  Filled 2022-04-09 (×3): qty 1

## 2022-04-09 MED ORDER — ESCITALOPRAM OXALATE 20 MG PO TABS
20.0000 mg | ORAL_TABLET | Freq: Every morning | ORAL | Status: DC
Start: 2022-04-10 — End: 2022-04-10
  Administered 2022-04-10: 20 mg via ORAL
  Filled 2022-04-09: qty 1

## 2022-04-09 MED ORDER — ALUM & MAG HYDROXIDE-SIMETH 200-200-20 MG/5ML PO SUSP: 30.0000 mL | ORAL | Status: DC | PRN

## 2022-04-09 MED ORDER — ROCURONIUM BROMIDE 10 MG/ML (PF) SYRINGE
PREFILLED_SYRINGE | INTRAVENOUS | Status: DC | PRN
  Administered 2022-04-09: 10 mg via INTRAVENOUS
  Administered 2022-04-09: 80 mg via INTRAVENOUS
  Administered 2022-04-09: 10 mg via INTRAVENOUS

## 2022-04-09 MED ORDER — BISACODYL 5 MG PO TBEC: 5.0000 mg | DELAYED_RELEASE_TABLET | Freq: Every day | ORAL | Status: DC | PRN

## 2022-04-09 MED ORDER — CEFAZOLIN SODIUM-DEXTROSE 2-4 GM/100ML-% IV SOLN
2.0000 g | Freq: Three times a day (TID) | INTRAVENOUS | Status: AC
  Administered 2022-04-09 – 2022-04-10 (×2): 2 g via INTRAVENOUS
  Filled 2022-04-09 (×2): qty 100

## 2022-04-09 MED ORDER — FENTANYL CITRATE PF 50 MCG/ML IJ SOSY
50.0000 ug | PREFILLED_SYRINGE | Freq: Once | INTRAMUSCULAR | Status: AC
  Administered 2022-04-09: 50 ug via INTRAVENOUS
  Filled 2022-04-09: qty 2

## 2022-04-09 MED ORDER — ALBUTEROL SULFATE (2.5 MG/3ML) 0.083% IN NEBU
2.5000 mg | INHALATION_SOLUTION | Freq: Four times a day (QID) | RESPIRATORY_TRACT | Status: DC | PRN
Start: 2022-04-09 — End: 2022-04-10

## 2022-04-09 MED ORDER — LACTATED RINGERS IV SOLN: INTRAVENOUS | Status: DC

## 2022-04-09 MED ORDER — POLYETHYLENE GLYCOL 3350 17 G PO PACK
17.0000 g | PACK | Freq: Every day | ORAL | Status: DC
  Filled 2022-04-09: qty 1

## 2022-04-09 MED ORDER — LOSARTAN POTASSIUM 25 MG PO TABS: 25.0000 mg | ORAL_TABLET | ORAL | Status: DC | PRN

## 2022-04-09 MED ORDER — PHENYLEPHRINE HCL-NACL 20-0.9 MG/250ML-% IV SOLN
INTRAVENOUS | Status: DC | PRN
  Administered 2022-04-09: 35 ug/min via INTRAVENOUS

## 2022-04-09 MED ORDER — PROPOFOL 10 MG/ML IV BOLUS
INTRAVENOUS | Status: DC | PRN
  Administered 2022-04-09: 150 mg via INTRAVENOUS

## 2022-04-09 MED ORDER — EPINEPHRINE PF 1 MG/ML IJ SOLN
INTRAMUSCULAR | Status: DC | PRN
  Administered 2022-04-09 (×2): 1 mg

## 2022-04-09 MED ORDER — PRONTOSAN WOUND IRRIGATION OPTIME
TOPICAL | Status: DC | PRN
  Administered 2022-04-09: 1

## 2022-04-09 MED ORDER — ALLOPURINOL 100 MG PO TABS
100.0000 mg | ORAL_TABLET | Freq: Every day | ORAL | Status: DC
  Administered 2022-04-09 – 2022-04-10 (×2): 100 mg via ORAL
  Filled 2022-04-09 (×2): qty 1

## 2022-04-09 MED ORDER — FENTANYL CITRATE (PF) 100 MCG/2ML IJ SOLN
INTRAMUSCULAR | Status: DC | PRN
  Administered 2022-04-09 (×2): 50 ug via INTRAVENOUS

## 2022-04-09 MED ORDER — DIPHENHYDRAMINE HCL 12.5 MG/5ML PO ELIX: 12.5000 mg | ORAL_SOLUTION | ORAL | Status: DC | PRN

## 2022-04-09 MED ORDER — COLCHICINE 0.6 MG PO TABS: 0.6000 mg | ORAL_TABLET | Freq: Every day | ORAL | Status: DC

## 2022-04-09 MED ORDER — VASOPRESSIN 20 UNIT/ML IV SOLN
INTRAVENOUS | Status: AC
  Filled 2022-04-09: qty 1

## 2022-04-09 MED ORDER — LOSARTAN POTASSIUM 25 MG PO TABS: 25.0000 mg | ORAL_TABLET | Freq: Two times a day (BID) | ORAL | Status: DC | PRN

## 2022-04-09 MED ORDER — ACETAMINOPHEN 325 MG PO TABS: 325.0000 mg | ORAL_TABLET | Freq: Four times a day (QID) | ORAL | Status: DC | PRN

## 2022-04-09 MED ORDER — MENTHOL 3 MG MT LOZG: 1.0000 | LOZENGE | OROMUCOSAL | Status: DC | PRN

## 2022-04-09 MED ORDER — APIXABAN 2.5 MG PO TABS
2.5000 mg | ORAL_TABLET | Freq: Two times a day (BID) | ORAL | Status: DC
Start: 2022-04-10 — End: 2022-04-10
  Administered 2022-04-10: 2.5 mg via ORAL
  Filled 2022-04-09: qty 1

## 2022-04-09 MED ORDER — DEXAMETHASONE SODIUM PHOSPHATE 10 MG/ML IJ SOLN
INTRAMUSCULAR | Status: DC | PRN
  Administered 2022-04-09: 4 mg via INTRAVENOUS

## 2022-04-09 MED ORDER — HYDROMORPHONE HCL 1 MG/ML IJ SOLN
0.5000 mg | INTRAMUSCULAR | Status: DC | PRN
  Administered 2022-04-09: 0.5 mg via INTRAVENOUS

## 2022-04-09 MED ORDER — CEFAZOLIN SODIUM-DEXTROSE 2-4 GM/100ML-% IV SOLN
2.0000 g | INTRAVENOUS | Status: AC
  Administered 2022-04-09: 2 g via INTRAVENOUS
  Filled 2022-04-09: qty 100

## 2022-04-09 MED ORDER — SODIUM CHLORIDE 0.9 % IR SOLN
Status: DC | PRN
  Administered 2022-04-09 (×3): 3000 mL

## 2022-04-09 MED ORDER — METHOCARBAMOL 500 MG PO TABS
ORAL_TABLET | ORAL | Status: AC
  Filled 2022-04-09: qty 1

## 2022-04-09 MED ORDER — OXYCODONE-ACETAMINOPHEN 10-325 MG PO TABS: 1.0000 | ORAL_TABLET | Freq: Four times a day (QID) | ORAL | Status: DC

## 2022-04-09 MED ORDER — OXYCODONE HCL 5 MG PO TABS
5.0000 mg | ORAL_TABLET | Freq: Four times a day (QID) | ORAL | Status: DC
  Administered 2022-04-09 – 2022-04-10 (×3): 5 mg via ORAL
  Filled 2022-04-09 (×3): qty 1

## 2022-04-09 MED ORDER — NITROGLYCERIN 0.4 MG SL SUBL
0.4000 mg | SUBLINGUAL_TABLET | SUBLINGUAL | Status: DC | PRN
Start: 2022-04-09 — End: 2022-04-10

## 2022-04-09 MED ORDER — TRANEXAMIC ACID-NACL 1000-0.7 MG/100ML-% IV SOLN
1000.0000 mg | INTRAVENOUS | Status: AC
  Administered 2022-04-09: 1000 mg via INTRAVENOUS
  Filled 2022-04-09: qty 100

## 2022-04-09 MED ORDER — PREGABALIN 75 MG PO CAPS
150.0000 mg | ORAL_CAPSULE | Freq: Three times a day (TID) | ORAL | Status: DC
  Administered 2022-04-09 – 2022-04-10 (×2): 150 mg via ORAL
  Filled 2022-04-09 (×2): qty 2

## 2022-04-09 MED ORDER — BUPIVACAINE HCL (PF) 0.5 % IJ SOLN
INTRAMUSCULAR | Status: DC | PRN
  Administered 2022-04-09: 12 mL via PERINEURAL

## 2022-04-09 MED ORDER — DOCUSATE SODIUM 100 MG PO CAPS
100.0000 mg | ORAL_CAPSULE | Freq: Two times a day (BID) | ORAL | Status: DC
Start: 2022-04-09 — End: 2022-04-10
  Administered 2022-04-09: 100 mg via ORAL
  Filled 2022-04-09 (×2): qty 1

## 2022-04-09 MED ORDER — METHOCARBAMOL 500 MG PO TABS
500.0000 mg | ORAL_TABLET | Freq: Four times a day (QID) | ORAL | Status: DC | PRN
  Administered 2022-04-09: 500 mg via ORAL

## 2022-04-09 MED ORDER — BUPIVACAINE LIPOSOME 1.3 % IJ SUSP
INTRAMUSCULAR | Status: DC | PRN
  Administered 2022-04-09: 10 mL via PERINEURAL

## 2022-04-09 MED ORDER — ORAL CARE MOUTH RINSE: 15.0000 mL | Freq: Once | OROMUCOSAL | Status: AC

## 2022-04-09 MED ORDER — ONDANSETRON HCL 4 MG/2ML IJ SOLN: 4.0000 mg | Freq: Four times a day (QID) | INTRAMUSCULAR | Status: DC | PRN

## 2022-04-09 MED ORDER — KCL IN DEXTROSE-NACL 20-5-0.45 MEQ/L-%-% IV SOLN
INTRAVENOUS | Status: DC
  Filled 2022-04-09: qty 1000

## 2022-04-09 MED ORDER — LIDOCAINE 2% (20 MG/ML) 5 ML SYRINGE
INTRAMUSCULAR | Status: DC | PRN
  Administered 2022-04-09: 60 mg via INTRAVENOUS

## 2022-04-09 MED ORDER — ONDANSETRON HCL 4 MG PO TABS: 4.0000 mg | ORAL_TABLET | Freq: Four times a day (QID) | ORAL | Status: DC | PRN

## 2022-04-09 MED ORDER — FENTANYL CITRATE (PF) 100 MCG/2ML IJ SOLN
INTRAMUSCULAR | Status: AC
  Filled 2022-04-09: qty 2

## 2022-04-09 MED ORDER — HYDROMORPHONE HCL 2 MG PO TABS
2.0000 mg | ORAL_TABLET | ORAL | Status: DC | PRN
  Administered 2022-04-09 – 2022-04-10 (×3): 2 mg via ORAL
  Filled 2022-04-09 (×2): qty 1

## 2022-04-09 MED ORDER — BUPIVACAINE-EPINEPHRINE 0.5% -1:200000 IJ SOLN
INTRAMUSCULAR | Status: DC | PRN
  Administered 2022-04-09: 15 mL

## 2022-04-09 SURGICAL SUPPLY — 71 items
AID PSTN UNV HD RSTRNT DISP (MISCELLANEOUS) ×1
ANCH SUT 2 SWLK 19.1 CLS EYLT (Anchor) ×2 IMPLANT
ANCHOR NDL 9/16 CIR SZ 8 (NEEDLE) IMPLANT
ANCHOR NEEDLE 9/16 CIR SZ 8 (NEEDLE) IMPLANT
ANCHOR SWIVELOCK BIO 4.75X19.1 (Anchor) ×2 IMPLANT
BAG COUNTER SPONGE SURGICOUNT (BAG) IMPLANT
BAG SPNG CNTER NS LX DISP (BAG)
BLADE SHAVER TORPEDO 4X13 (MISCELLANEOUS) ×2 IMPLANT
BLADE SURG SZ11 CARB STEEL (BLADE) ×2 IMPLANT
BURR OVAL 8 FLU 4.0X13 (MISCELLANEOUS) IMPLANT
CANNULA ACUFO 5X76 (CANNULA) ×2 IMPLANT
CLEANER TIP ELECTROSURG 2X2 (MISCELLANEOUS) IMPLANT
COVER SURGICAL LIGHT HANDLE (MISCELLANEOUS) ×2 IMPLANT
DRAPE IMP U-DRAPE 54X76 (DRAPES) ×2 IMPLANT
DRAPE ORTHO SPLIT 77X108 STRL (DRAPES) ×4
DRAPE POUCH INSTRU U-SHP 10X18 (DRAPES) ×2 IMPLANT
DRAPE STERI 35X30 U-POUCH (DRAPES) ×2 IMPLANT
DRAPE SURG ORHT 6 SPLT 77X108 (DRAPES) ×2 IMPLANT
DRSG AQUACEL AG ADV 3.5X 4 (GAUZE/BANDAGES/DRESSINGS) ×1 IMPLANT
DRSG AQUACEL AG ADV 3.5X 6 (GAUZE/BANDAGES/DRESSINGS) ×1 IMPLANT
DRSG PAD ABDOMINAL 8X10 ST (GAUZE/BANDAGES/DRESSINGS) ×2 IMPLANT
DURAPREP 26ML APPLICATOR (WOUND CARE) ×2 IMPLANT
DW OUTFLOW CASSETTE/TUBE SET (MISCELLANEOUS) ×2 IMPLANT
ELECT NDL TIP 2.8 STRL (NEEDLE) ×1 IMPLANT
ELECT NEEDLE TIP 2.8 STRL (NEEDLE) ×4 IMPLANT
ELECT REM PT RETURN 15FT ADLT (MISCELLANEOUS) ×2 IMPLANT
FILTER STRAW (MISCELLANEOUS) ×2 IMPLANT
GLOVE BIOGEL PI IND STRL 7.0 (GLOVE) ×1 IMPLANT
GLOVE BIOGEL PI IND STRL 8 (GLOVE) ×1 IMPLANT
GLOVE BIOGEL PI INDICATOR 7.0 (GLOVE) ×1
GLOVE BIOGEL PI INDICATOR 8 (GLOVE) ×1
GLOVE SURG POLYISO LF SZ7.5 (GLOVE) ×4 IMPLANT
GLOVE SURG SS PI 8.0 STRL IVOR (GLOVE) ×4 IMPLANT
GOWN STRL REUS W/ TWL XL LVL3 (GOWN DISPOSABLE) ×2 IMPLANT
GOWN STRL REUS W/TWL XL LVL3 (GOWN DISPOSABLE) ×4
KIT BASIN OR (CUSTOM PROCEDURE TRAY) ×2 IMPLANT
KIT TURNOVER KIT A (KITS) ×1 IMPLANT
MANIFOLD NEPTUNE II (INSTRUMENTS) ×2 IMPLANT
NDL SCORPION MULTI FIRE (NEEDLE) IMPLANT
NDL SPNL 18GX3.5 QUINCKE PK (NEEDLE) ×1 IMPLANT
NEEDLE SCORPION MULTI FIRE (NEEDLE) ×2 IMPLANT
NEEDLE SPNL 18GX3.5 QUINCKE PK (NEEDLE) ×2 IMPLANT
PACK SHOULDER (CUSTOM PROCEDURE TRAY) ×2 IMPLANT
PORT APPOLLO RF 90DEGREE MULTI (SURGICAL WAND) ×2 IMPLANT
PROTECTOR NERVE ULNAR (MISCELLANEOUS) ×2 IMPLANT
RESTRAINT HEAD UNIVERSAL NS (MISCELLANEOUS) ×2 IMPLANT
SLING ARM FOAM STRAP LRG (SOFTGOODS) ×1 IMPLANT
SLING ARM IMMOBILIZER LRG (SOFTGOODS) IMPLANT
SLING ARM IMMOBILIZER MED (SOFTGOODS) IMPLANT
SLING ULTRA II L (ORTHOPEDIC SUPPLIES) IMPLANT
STRIP CLOSURE SKIN 1/2X4 (GAUZE/BANDAGES/DRESSINGS) IMPLANT
SUCTION FRAZIER HANDLE 12FR (TUBING) ×2
SUCTION TUBE FRAZIER 12FR DISP (TUBING) ×1 IMPLANT
SUT ETHIBOND NAB CT1 #1 30IN (SUTURE) ×1 IMPLANT
SUT ETHILON 3 0 PS 1 (SUTURE) ×1 IMPLANT
SUT ETHILON 4 0 PS 2 18 (SUTURE) ×2 IMPLANT
SUT FIBERWIRE #2 38 T-5 BLUE (SUTURE)
SUT PROLENE 3 0 PS 2 (SUTURE) ×2 IMPLANT
SUT TIGER TAPE 7 IN WHITE (SUTURE) ×2 IMPLANT
SUT VIC AB 0 CT1 27 (SUTURE) ×2
SUT VIC AB 0 CT1 27XBRD ANTBC (SUTURE) ×1 IMPLANT
SUT VIC AB 1-0 CT2 27 (SUTURE) ×1 IMPLANT
SUT VIC AB 2-0 CT2 27 (SUTURE) IMPLANT
SUT VICRYL 0 UR6 27IN ABS (SUTURE) ×3 IMPLANT
SUTURE FIBERWR #2 38 T-5 BLUE (SUTURE) IMPLANT
SYR 20ML LL LF (SYRINGE) ×2 IMPLANT
TAPE FIBER 2MM 7IN #2 BLUE (SUTURE) IMPLANT
TOWEL OR 17X26 10 PK STRL BLUE (TOWEL DISPOSABLE) ×2 IMPLANT
TUBING ARTHROSCOPY IRRIG 16FT (MISCELLANEOUS) ×2 IMPLANT
TUBING CONNECTING 10 (TUBING) ×5 IMPLANT
WIPE CHG 2% PREP (PERSONAL CARE ITEMS) ×2 IMPLANT

## 2022-04-09 NOTE — Anesthesia Postprocedure Evaluation (Signed)
Anesthesia Post Note  Patient: Hector [name]  Procedure(s) Performed: LEFT SHOULDER ARTHROSCOPY WITH MINI OPEN ROTATOR CUFF REPAIR, SUBACROMIAL DECOMPRESSION, POSSIBLE DISTAL CLAVICLE RESECTION AND BONE GRAFTING HUMERAL HEAD WITH ALLOGRAFT AND AUTOLOGOUS BONE (Left)     Patient location during evaluation: PACU Anesthesia Type: General Level of consciousness: awake and alert Pain management: pain level controlled Vital Signs Assessment: post-procedure vital signs reviewed and stable Respiratory status: spontaneous breathing, nonlabored ventilation, respiratory function stable and patient connected to nasal cannula oxygen Cardiovascular status: blood pressure returned to baseline and stable Postop Assessment: no apparent nausea or vomiting Anesthetic complications: no   No notable events documented.  Last Vitals:  Vitals:   04/09/22 1315 04/09/22 1330  BP: 135/88 136/89  Pulse: 72   Resp: 14 17  Temp:  36.5 C  SpO2: 95% 97%                [name]

## 2022-04-09 NOTE — Anesthesia Procedure Notes (Signed)
Procedure Name: Intubation Date/Time: 04/09/2022 10:35 AM  Performed by: Lavina Hamman, CRNAPre-anesthesia Checklist: Patient identified, Emergency Drugs available, Suction available, Patient being monitored and Timeout performed Patient Re-evaluated:Patient Re-evaluated prior to induction Oxygen Delivery Method: Circle system utilized Preoxygenation: Pre-oxygenation with 100% oxygen Induction Type: IV induction Ventilation: Mask ventilation without difficulty Laryngoscope Size: Mac and 4 Grade View: Grade II Tube type: Oral Tube size: 7.5 mm Number of attempts: 1 Airway Equipment and Method: Stylet Placement Confirmation: ETT inserted through vocal cords under direct vision, positive ETCO2, CO2 detector and breath sounds checked- equal and bilateral Secured at: 23 cm Tube secured with: Tape Dental Injury: Teeth and Oropharynx as per pre-operative assessment  Comments: ATOI

## 2022-04-09 NOTE — Progress Notes (Signed)
PT Note  Patient Details Name: Hector [name] MRN: [id number] DOB: 09/09/1961  OT to see pt tomorrow morning, will follow up to see if further PT needs at that time after OT assessment. Thank you     Sheral Pfahler 04/09/2022, 4:20 PM

## 2022-04-09 NOTE — Brief Op Note (Signed)
04/09/2022  1:03 PM  PATIENT:  Hector [name]  61 y.o. 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  PROCEDURE:  Procedure(s) with comments: LEFT SHOULDER ARTHROSCOPY WITH MINI OPEN ROTATOR CUFF REPAIR, SUBACROMIAL DECOMPRESSION, POSSIBLE DISTAL CLAVICLE RESECTION AND BONE GRAFTING HUMERAL HEAD WITH ALLOGRAFT AND AUTOLOGOUS BONE (Left) - 2 hrs  SURGEON:  Surgeon(s) and Role:    Susa Day, MD - Primary  PHYSICIAN ASSISTANT:   ASSISTANTS: [place]   ANESTHESIA:   general  EBL:  50 mL   BLOOD ADMINISTERED:none  DRAINS: none   LOCAL MEDICATIONS USED:  MARCAINE     SPECIMEN:  No Specimen  DISPOSITION OF SPECIMEN:  N/A  COUNTS:  YES  TOURNIQUET:  * No tourniquets in log *  DICTATION: .Other Dictation: Dictation Number ` [id number]   PLAN OF CARE: Admit for overnight observation  PATIENT DISPOSITION:  PACU - hemodynamically stable.   Delay start of Pharmacological VTE agent (>24hrs) due to surgical blood loss or risk of bleeding: no

## 2022-04-09 NOTE — Interval H&P Note (Signed)
History and Physical Interval Note:  04/09/2022 9:39 AM  Hector [name]  has presented today for surgery, with the diagnosis of Left avascular necrosis humeral head and rotator cuff tear.  The various methods of treatment have been discussed with the patient and family. After consideration of risks, benefits and other options for treatment, the patient has consented to  Procedure(s) with comments: LEFT SHOULDER ARTHROSCOPY WITH MINI OPEN ROTATOR CUFF REPAIR, SUBACROMIAL DECOMPRESSION, POSSIBLE DISTAL CLAVICLE RESECTION AND BONE GRAFTING HUMERAL HEAD WITH ALLOGRAFT AND AUTOLOGOUS BONE (Left) - 2 hrs as a surgical intervention.  The patient's history has been reviewed, patient examined, no change in status, stable for surgery.  I have reviewed the patient's chart and labs.  Questions were answered to the patient's satisfaction.     [name]  We discussed bone grafting allograft and potentially autograft.  We discussed removing the spur from the clavicle or performed a distal clavicle resection.  We discussed postoperative in detail.  And if the repair and the bone grafting does not take then ultimately would require a shoulder arthroplasty.

## 2022-04-09 NOTE — Anesthesia Procedure Notes (Signed)
Anesthesia Regional Block: Interscalene brachial plexus block   Pre-Anesthetic Checklist: , timeout performed,  Correct Patient, Correct Site, Correct Laterality,  Correct Procedure, Correct Position, site marked,  Risks and benefits discussed,  Surgical consent,  Pre-op evaluation,  At surgeon's request and post-op pain management  Laterality: Left  Prep: chloraprep       Needles:  Injection technique: Single-shot  Needle Type: Echogenic Stimulator Needle     Needle Length: 9cm  Needle Gauge: 21     Additional Needles:   Procedures:,,,, ultrasound used (permanent image in chart),,    Narrative:  Start time: 04/09/2022 9:40 AM End time: 04/09/2022 9:45 AM Injection made incrementally with aspirations every 5 mL.  Performed by: Personally  Anesthesiologist: [name], MD  Additional Notes: Patient tolerated the procedure well. Local anesthetic introduced in an incremental fashion under minimal resistance after negative aspirations. No paresthesias were elicited. After completion of the procedure, no acute issues were identified and patient continued to be monitored by RN.

## 2022-04-09 NOTE — Discharge Instructions (Signed)
Aquacel dressing may remain in place until follow up. May shower with aquacel dressing in place. If the dressing becomes saturated or peels off, you may remove it and place a new dressing with gauze and tape which should be kept clean and dry and changed daily. Use sling at times except when exercising or showering No driving for 4-6 weeks No lifting for 6 weeks operative arm Pendulum exercises as instructed. Ok to move wrist, elbow, and hand. See Dr. Tonita Cong in 10-14 days.  Information on my medicine - ELIQUIS (apixaban)  This medication education was reviewed with me or my healthcare representative as part of my discharge preparation.  The pharmacist that spoke with me during my hospital stay was:  Barnet Pall, Student-PharmD  Why was Eliquis prescribed for you? Eliquis was prescribed for you to reduce the risk of blood clots forming after orthopedic surgery.    What do You need to know about Eliquis? Take your Eliquis TWICE DAILY - one tablet in the morning and one tablet in the evening with or without food.  It would be best to take the dose about the same time each day.  If you have difficulty swallowing the tablet whole please discuss with your pharmacist how to take the medication safely.  Take Eliquis exactly as prescribed by your doctor and DO NOT stop taking Eliquis without talking to the doctor who prescribed the medication.  Stopping without other medication to take the place of Eliquis may increase your risk of developing a clot.  After discharge, you should have regular check-up appointments with your healthcare provider that is prescribing your Eliquis.  What do you do if you miss a dose? If a dose of ELIQUIS is not taken at the scheduled time, take it as soon as possible on the same day and twice-daily administration should be resumed.  The dose should not be doubled to make up for a missed dose.  Do not take more than one tablet of ELIQUIS at the same  time.  Important Safety Information A possible side effect of Eliquis is bleeding. You should call your healthcare provider right away if you experience any of the following: Bleeding from an injury or your nose that does not stop. Unusual colored urine (red or dark brown) or unusual colored stools (red or black). Unusual bruising for unknown reasons. A serious fall or if you hit your head (even if there is no bleeding).  Some medicines may interact with Eliquis and might increase your risk of bleeding or clotting while on Eliquis. To help avoid this, consult your healthcare provider or pharmacist prior to using any new prescription or non-prescription medications, including herbals, vitamins, non-steroidal anti-inflammatory drugs (NSAIDs) and supplements.  This website has more information on Eliquis (apixaban): http://www.eliquis.com/eliquis/home

## 2022-04-09 NOTE — Transfer of Care (Signed)
Immediate Anesthesia Transfer of Care Note  Patient: Hector [name]  Procedure(s) Performed: Procedure(s) with comments: LEFT SHOULDER ARTHROSCOPY WITH MINI OPEN ROTATOR CUFF REPAIR, SUBACROMIAL DECOMPRESSION, POSSIBLE DISTAL CLAVICLE RESECTION AND BONE GRAFTING HUMERAL HEAD WITH ALLOGRAFT AND AUTOLOGOUS BONE (Left) - 2 hrs  Patient Location: PACU  Anesthesia Type:General  Level of Consciousness:  sedated, patient cooperative and responds to stimulation  Airway & Oxygen Therapy:Patient Spontanous Breathing and Patient connected to face mask oxgen  Post-op Assessment:  Report given to PACU RN and Post -op Vital signs reviewed and stable  Post vital signs:  Reviewed and stable  Last Vitals:  Vitals:   04/09/22 0934 04/09/22 0948  BP: (!) 141/96 128/72  Pulse: 65 72  Resp: 15 17  Temp:    SpO2:  75%    Complications: No apparent anesthesia complications

## 2022-04-09 NOTE — Anesthesia Preprocedure Evaluation (Addendum)
Anesthesia Evaluation  Patient identified by MRN, date of birth, ID band Patient awake    Reviewed: Allergy & Precautions, NPO status , Patient's Chart, lab work & pertinent test results  History of Anesthesia Complications (+) PONV and history of anesthetic complications  Airway Mallampati: II  TM Distance: >3 FB Neck ROM: Full    Dental  (+) Teeth Intact, Dental Advisory Given   Pulmonary sleep apnea ,    breath sounds clear to auscultation       Cardiovascular hypertension, Pt. on medications  Rhythm:Regular Rate:Normal     Neuro/Psych  Headaches, PSYCHIATRIC DISORDERS Anxiety Depression Bipolar Disorder  Neuromuscular disease    GI/Hepatic PUD, GERD  ,  Endo/Other  diabetes  Renal/GU      Musculoskeletal  (+) Arthritis ,   Abdominal Normal abdominal exam  (+)   Peds  Hematology   Anesthesia Other Findings   Reproductive/Obstetrics                            Anesthesia Physical Anesthesia Plan  ASA: 3  Anesthesia Plan: General   Post-op Pain Management: Regional block*   Induction: Intravenous  PONV Risk Score and Plan: 4 or greater and Ondansetron, Dexamethasone, Midazolam and Scopolamine patch - Pre-op  Airway Management Planned: Oral ETT  Additional Equipment: None  Intra-op Plan:   Post-operative Plan: Extubation in OR  Informed Consent: I have reviewed the patients History and Physical, chart, labs and discussed the procedure including the risks, benefits and alternatives for the proposed anesthesia with the patient or authorized representative who has indicated his/her understanding and acceptance.     Dental advisory given  Plan Discussed with: CRNA  Anesthesia Plan Comments:        Anesthesia Quick Evaluation

## 2022-04-09 NOTE — Op Note (Signed)
NAME: Hector Hector [name], Hector [name] MEDICAL RECORD NO: [id number] ACCOUNT NO: [id number] DATE OF BIRTH: 1960-12-06 FACILITY: [facility]: [facility] PHYSICIAN: [name], MD  Operative Report   DATE OF PROCEDURE: 04/09/2022  PREOPERATIVE DIAGNOSES:  1.  High-grade rotator cuff tear, supraspinatus, left shoulder. 2.  Impingement syndrome, left shoulder. 3.  Acromioclavicular arthrosis. 4.  Avascular necrosis, humeral head.  POSTOPERATIVE DIAGNOSES:  1.  High-grade rotator cuff tear, supraspinatus, left shoulder. 2.  Impingement syndrome, left shoulder. 3.  Acromioclavicular arthrosis. 4.  Avascular necrosis, humeral head. 5. Anterior and posterior labral tear.  PROCEDURES PERFORMED:  1.  Left shoulder arthroscopy with extensive debridement including anterior and posterior labrum, subacromial and subdeltoid bursa. 2.  Clavicular plasty. 3.  Mini open rotator cuff repair. 4.  Drilling of avascular necrosis of the humeral head. 5.  Subacromial decompression, acromioplasty with release of CA ligament.  HISTORY:  This is a 61 year old male who is status post a fracture dislocation of the shoulder, sustaining a greater tuberosity fracture.  The patient had persistent shoulder pain.  MRI indicating a high-grade tear of the supraspinatus.  Areas of  avascular necrosis was noted over the central superior portion of the humeral head without articular collapse.  He had some AC arthrosis, more inferiorly with an inferior clavicular spur.  The patient was indicated for shoulder arthroscopy, evaluation  under anesthesia, subacromial decompression, mini open rotator cuff repair and either bone grafting and curetting of the avascular necrosis, with drilling and a possible distal clavicle resection.  Risks and benefits discussed including bleeding,  infection, damage to neurovascular structures, no change in symptoms, worsening symptoms, DVT, PE, anesthetic complications, etc.  TECHNIQUE:  With the patient in  supine beach chair position, after induction of adequate general anesthesia, 2 grams Kefzol and a regional block, the left upper extremity, precordial region and axilla was prepped and draped in the usual sterile fashion  including the shoulder.  Examination under anesthesia revealed full range with external rotation and with abduction, there was no subluxation of the shoulder.  Using a surgical marker, delineated the acromion, AC joint, coracoid.  A standard  posterolateral portal was fashioned with a #11 blade as well as an anterolateral portal.  With the arm in the 70/30 position, gentle traction applied.  We advanced the arthroscopic cannula into the glenohumeral joint, penetrating atraumatically without  difficulty.  The irrigant was utilized to insufflate the joint at 30 mmHg.  Noted was degenerative tearing of the anterior and posterior labrum, biceps tendon, although inflamed, although indurated, was intact.  The subscap appeared to be intact as well.   Undersurface of the rotator cuff, there was some degenerative fraying and tearing at the supraspinatus insertion.  The biceps appeared to be unremarkable.  The articular surface appeared to be unremarkable in the humeral head, other than some mild  chondromalacia, but no grade IV changes or collapse was noted.  Anterior portal was fashioned midway between the coracoid and the anterolateral aspect of the acromion.  After localizing with the 18-gauge needle in the rotator interval, just beneath the  biceps tendon.  Incision was made through the skin only.  Cannula was then advanced into the glenohumeral joint.  Following this, the shaver was introduced and utilized to shave the anterior and posterior labrum.  We lavaged the glenohumeral joint.   Following this, I redirected the cannula into the anterolateral portal, triangulating the subacromial space.  Extensive exuberant subacromial and subdeltoid bursa was noted.  I introduced a shaver and performed a  full bursectomy extending to the [facility]  joint.  I placed an 18-gauge needle in the [facility] joint, localizing the anterior and posterior aspect of the AC joint.  There was an inferior spur off of the clavicle anteriorly, reducing the subacromial space as well as a type 2 acromion.  The tendon in the  supraspinatus was palpated, it was very thin at the supraspinatus and protected with care.  After full bursectomy, I introduced a bur.  After releasing the CA ligament with an Arthrowand and performed an acromioplasty.  A small portion of the anterior  aspect of the acromion was removed, approximately 3 mm.  As well as the acromial facet of the [facility] joint, which was impinging as well.  Following that, we used a bur to shave off a spur off the inferior and anterior portion of the clavicle.  This was about  3 x 4 mm.  I looked into the [facility] joint also from the anterolateral portal and did not see any further impinging from the distal clavicle.  Following the bursectomy and achieving hemostasis, I removed all arthroscopic instrumentation, closed the portals  with 4-0 nylon simple sutures.  I made a small 3 cm incision over the anterolateral aspect of the acromion.  Subcutaneous tissue was dissected electrocartery achieved hemostasis.  The raphae between the anterior and lateral heads was divided in line with skin  incision.  A self-retaining retractor was placed.  I divided the raphe in line with the incision.  A self-retaining retractor again was placed and the subacromial space was entered.  I digitally lysed the adhesions.  I located the tearing of the  supraspinatus.  It was approximately 2 cm in length and approximately  a half a centimeter in width.  I completed the tear was with excision.  The aperture then was approximately 2 cm x 1 cm.  This exposed the lateral aspect of the articular surface.   From his axial and coronal views, the area of necrosis was subchondral, lateral and cephalad.  For the repair, I felt a  SwiveLock, one laterally would suffice and one medially.  I used an awl and impacted a pilot hole just lateral to the articular  surface.  I then used a Freer to probe the subchondral and from various depths of the pilot hole.  Again, pilot hole was such that it would facilitate countersinking of the SwiveLock.  I did not appreciate any significant chasm or a void that would be  amenable to curetting and to open bone grafting.  I felt though that an extensive takedown would compromise the humeral head and the rotator cuff insertion.  I therefore used a K-wire, to approximately a 4 mm length.  We then placed a tape on the K-wire  and I used a Soil scientist to evaluate the chondral surface and then in multiple directions anteriorly directed medial to lateral and posteromedially.  I drilled the subchondral region, in a bit deeper, a few centimeters deeper, consistent with the  involvement noted on the MRI.  I felt this drilling may help to revascularize this area of the humeral head without structurally compromising it.  Following this, I inserted the SwiveLock with TigerTape into this pilot hole that was countersunk with.   After advancing the SwiveLock, there was excellent resistance to pullout.  I used the Scorpion suture passer to pass the leaflets both anteromedially and posteromedially.  I then used the shell portion of the suture and passed it anteriorly on the  supraspinatus  tear.  We then with a shuttle suture, passed it posteriorly and then brought the 2 edges together.  We did that with a suture in a running stitch type construct and then with that suture, and the two TigerTape sutures that were crossed,  fixed it to a lateral row anchor, just over the greater tuberosity after fashioning a pilot hole, inserting it, without undue tension with the arm in the neutral position.  When we passed with a Scorpion suture passer, care was taken to maintain contact  with the undersurface of the supraspinatus,  avoiding the corporation of the biceps tendon.  Full coverage was noted.  Remainder of the cuff was unremarkable.  Just prior to its closure, I fully inspected the humeral head and palpated it with a Valora Corporal and  felt there no chondral defect or bony defect noted.  After copious irrigation with Prontosan, I closed the raphae with 0 Vicryl interrupted figure-of-eight suture, subcutaneous with 2-0 and skin with Prolene.  Sterile dressing applied, placed in a sling,  extubated, and transported to the recovery room in satisfactory condition.  The patient tolerated the procedure well with no complications.  Assistant Lacie Draft, [place], was used throughout the case for patient positioning, traction on the extremity, closure.   MUK D: 04/09/2022 1:33:50 pm T: 04/09/2022 11:29:00 pm  JOB: [id number]/ [id number]

## 2022-04-10 ENCOUNTER — Encounter (HOSPITAL_COMMUNITY): Payer: Self-pay | Admitting: Specialist

## 2022-04-10 DIAGNOSIS — I10 Essential (primary) hypertension: Secondary | ICD-10-CM | POA: Diagnosis not present

## 2022-04-10 DIAGNOSIS — M75122 Complete rotator cuff tear or rupture of left shoulder, not specified as traumatic: Secondary | ICD-10-CM | POA: Diagnosis not present

## 2022-04-10 DIAGNOSIS — K219 Gastro-esophageal reflux disease without esophagitis: Secondary | ICD-10-CM | POA: Diagnosis not present

## 2022-04-10 DIAGNOSIS — F419 Anxiety disorder, unspecified: Secondary | ICD-10-CM | POA: Diagnosis not present

## 2022-04-10 DIAGNOSIS — X58XXXA Exposure to other specified factors, initial encounter: Secondary | ICD-10-CM | POA: Diagnosis not present

## 2022-04-10 DIAGNOSIS — M7542 Impingement syndrome of left shoulder: Secondary | ICD-10-CM | POA: Diagnosis not present

## 2022-04-10 DIAGNOSIS — M19012 Primary osteoarthritis, left shoulder: Secondary | ICD-10-CM | POA: Diagnosis not present

## 2022-04-10 DIAGNOSIS — E1142 Type 2 diabetes mellitus with diabetic polyneuropathy: Secondary | ICD-10-CM | POA: Diagnosis not present

## 2022-04-10 DIAGNOSIS — S43402A Unspecified sprain of left shoulder joint, initial encounter: Secondary | ICD-10-CM | POA: Diagnosis not present

## 2022-04-10 DIAGNOSIS — G4733 Obstructive sleep apnea (adult) (pediatric): Secondary | ICD-10-CM | POA: Diagnosis not present

## 2022-04-10 DIAGNOSIS — M879 Osteonecrosis, unspecified: Secondary | ICD-10-CM | POA: Diagnosis not present

## 2022-04-10 DIAGNOSIS — F319 Bipolar disorder, unspecified: Secondary | ICD-10-CM | POA: Diagnosis not present

## 2022-04-10 DIAGNOSIS — Z79899 Other long term (current) drug therapy: Secondary | ICD-10-CM | POA: Diagnosis not present

## 2022-04-10 LAB — BASIC METABOLIC PANEL
Anion gap: 6 (ref 5–15)
BUN: 15 mg/dL (ref 6–20)
CO2: 25 mmol/L (ref 22–32)
Calcium: 8.7 mg/dL — ABNORMAL LOW (ref 8.9–10.3)
Chloride: 112 mmol/L — ABNORMAL HIGH (ref 98–111)
Creatinine, Ser: 1.13 mg/dL (ref 0.61–1.24)
GFR, Estimated: >60 mL/min (ref >60)
Glucose, Bld: 138 mg/dL — ABNORMAL HIGH (ref 70–99)
Potassium: 4.7 mmol/L (ref 3.5–5.1)
Sodium: 143 mmol/L (ref 135–145)

## 2022-04-10 LAB — HEMOGLOBIN AND HEMATOCRIT, BLOOD
HCT: 35.1 % — ABNORMAL LOW (ref 39.0–52.0)
Hemoglobin: 10.4 g/dL — ABNORMAL LOW (ref 13.0–17.0)

## 2022-04-10 MED ORDER — HYDROMORPHONE HCL 2 MG PO TABS: 4.0000 mg | ORAL_TABLET | ORAL | 0 refills | Status: AC | PRN

## 2022-04-10 MED ORDER — APIXABAN 2.5 MG PO TABS: 2.5000 mg | ORAL_TABLET | Freq: Two times a day (BID) | ORAL | 1 refills | Status: DC

## 2022-04-10 MED ORDER — DOCUSATE SODIUM 100 MG PO CAPS: 100.0000 mg | ORAL_CAPSULE | Freq: Two times a day (BID) | ORAL | 1 refills | Status: DC | PRN

## 2022-04-10 MED ORDER — POLYETHYLENE GLYCOL 3350 17 G PO PACK: 17.0000 g | PACK | Freq: Every day | ORAL | 0 refills | Status: DC

## 2022-04-10 NOTE — Progress Notes (Signed)
Subjective: 1 Day Post-Op Procedure(s) (LRB): LEFT SHOULDER ARTHROSCOPY WITH MINI OPEN ROTATOR CUFF REPAIR, SUBACROMIAL DECOMPRESSION, POSSIBLE DISTAL CLAVICLE RESECTION AND BONE GRAFTING HUMERAL HEAD WITH ALLOGRAFT AND AUTOLOGOUS BONE (Left) Patient reports pain as 3 on 0-10 scale.   Denies CP or SOB.  Voiding without difficulty. Positive flatus. Objective: Vital signs in last 24 hours: Temp:  [97.3 F (36.3 C)-98.8 F (37.1 C)] 97.7 F (36.5 C) (07/27 0547) Pulse Rate:  [65-91] 67 (07/27 0547) Resp:  [11-22] 17 (07/27 0547) BP: (111-147)/(56-96) 132/88 (07/27 0547) SpO2:  [93 %-100 %] 99 % (07/27 0547) Weight:  [88.5 kg] 88.5 kg (07/26 1600)  Intake/Output from previous day: 07/26 0701 - 07/27 0700 In: 3322.6 [P.O.:1440; I.V.:1682.6; IV Piggyback:200] Out: 1750 [Urine:1700; Blood:50] Intake/Output this shift: No intake/output data recorded.  Recent Labs    04/08/22 0840 04/10/22 0348  HGB 11.9* 10.4*   Recent Labs    04/08/22 0840 04/10/22 0348  WBC 3.2*  --   RBC 4.18*  --   HCT 38.9* 35.1*  PLT 88*  --    Recent Labs    04/10/22 0348  NA 143  K 4.7  CL 112*  CO2 25  BUN 15  CREATININE 1.13  GLUCOSE 138*  CALCIUM 8.7*   No results for input(s): "LABPT", "INR" in the last 72 hours.  Incision: dressing C/D/I per nursing Good pulses per nursing  Assessment/Plan:  1 Day Post-Op Procedure(s) (LRB): LEFT SHOULDER ARTHROSCOPY WITH MINI OPEN ROTATOR CUFF REPAIR, SUBACROMIAL DECOMPRESSION, POSSIBLE DISTAL CLAVICLE RESECTION AND BONE GRAFTING HUMERAL HEAD WITH ALLOGRAFT AND AUTOLOGOUS BONE (Left) Discharge home with home health D/C instructions given  Principal Problem:   Complete rotator cuff tear      [name] 04/10/2022, '@NOW'$ 

## 2022-04-10 NOTE — Progress Notes (Signed)
Pt is ready for discharge.  PIV removed.  Remains alert and oriented.  Able to ambulate on unit with a steady gait.  AVS given.  Pt able to verbalize understanding of all discharge instructions, including wound care.  All questions answered.

## 2022-04-10 NOTE — Discharge Summary (Signed)
Patient ID: Hector [name] MRN: [id number] DOB/AGE: 1961/04/03 61 y.o.  Admit date: 04/09/2022 Discharge date: 04/10/2022  Admission Diagnoses:  Principal Problem:   Complete rotator cuff tear   Discharge Diagnoses:  Same  Past Medical History:  Diagnosis Date   Abnormal LFTs    improved after gastic bypass   ADJ DISORDER WITH MIXED ANXIETY & DEPRESSED MOOD 08/23/2009   Qualifier: Diagnosis of  By: [name] MD, Standley Brooking Now on lamictal and lexapro and elevil for sleep.    Anxiety    Ascending aortic aneurysm ([place])    a. 06/2018 CT: 4.1cm.   B12 nutritional deficiency    Bipolar disorder ([place])    Carpal tunnel syndrome 04/06/2007   Qualifier: Diagnosis of  By: Hulan Saas, CMA (AAMA), Larene [place] S    Chest pain    a. 2007 MV: No ischemia; b. 09/2018 recurrent c/p in setting of PE; c. 09/2018 MV: No ischemia/infarct. EF 55-65%.   Closed head injury    laceration  9 12  no loc  vision change   DDD (degenerative disc disease)    Depression    Diabetes mellitus    resolved with gastric bypass   GERD (gastroesophageal reflux disease)    no meds   Gout    Headache    once per week uses Indomethicin hx of cluster headaches    History of bunionectomy of left great toe    History of concussion    History of echocardiogram    a. 09/2018 Echo: Ef 65-70%, no rwma, mild MR. Ao root 36m (mildly dil).   History of kidney stones    History of renal stone    Seen in emergency room not urologist   Hyperlipidemia    Hypertension 02/27/2017   [place]romuscular [place]    Nonspecific pain in the lumbar region    OBESITY 12/10/2007   a. s/p gastric bypass.   OSA (obstructive sleep apnea) 12/12/2013   resolved by weight loss surgery 10/26/2009   Osteoarthritis of knee    Pain in left knee    Pancreatitis 03/21/2016   went to [place]   Perforated ulcer ([facility]    Surgical repair9/24 2011   Peripheral neuropathy    PONV (postoperative nausea and vomiting)    PTSD (post-traumatic stress  disorder)    [place]ism [place]    a. 09/18/2018 CTA Chest: RLL segmental arterial branch small volume PE-->Eliquis.   remote Lumbar radiculopathy 10/01/2021    Surgeries: Procedure(s): LEFT SHOULDER ARTHROSCOPY WITH MINI OPEN ROTATOR CUFF REPAIR, SUBACROMIAL DECOMPRESSION, POSSIBLE DISTAL CLAVICLE RESECTION AND BONE GRAFTING HUMERAL HEAD WITH ALLOGRAFT AND AUTOLOGOUS BONE on 04/09/2022   Consultants:   Discharged Condition: Improved  Hospital Course: Hector FRASIERis an 61y.o. male who was admitted 04/09/2022 for operative treatment ofComplete rotator cuff tear. Patient has severe unremitting pain that affects sleep, daily activities, and work/hobbies. After pre-op clearance the patient was taken to the operating room on 04/09/2022 and underwent  Procedure(s): LEFT SHOULDER ARTHROSCOPY WITH MINI OPEN ROTATOR CUFF REPAIR, SUBACROMIAL DECOMPRESSION, POSSIBLE DISTAL CLAVICLE RESECTION AND BONE GRAFTING HUMERAL HEAD WITH ALLOGRAFT AND AUTOLOGOUS BONE.    Patient was given perioperative antibiotics:  Anti-infectives (From admission, onward)    Start     Dose/Rate Route Frequency Ordered Stop   04/09/22 1830  ceFAZolin (ANCEF) IVPB 2g/100 mL premix        2 g 200 mL/hr over 30 Minutes Intravenous Every 8 hours 04/09/22 1546 04/10/22 0630   04/09/22 0815  ceFAZolin (ANCEF) IVPB 2g/100 mL premix        2 g 200 mL/hr over 30 Minutes Intravenous On call to O.R. 04/09/22 0810 04/09/22 1034        Patient was given sequential compression devices, early ambulation, and chemoprophylaxis to prevent DVT.  Patient benefited maximally from hospital stay and there were no complications.    Recent vital signs: Patient Vitals for the past 24 hrs:  BP Temp Temp src Pulse Resp SpO2 Height Weight  04/10/22 0547 132/88 97.7 F (36.5 C) Oral 67 17 99 % -- --  04/10/22 0154 124/85 98 F (36.7 C) Oral 78 17 97 % -- --  04/09/22 2140 116/84 97.8 F (36.6 C) Oral 80 17 96 % -- --  04/09/22 1828 111/72  98 F (36.7 C) Oral 91 16 95 % -- --  04/09/22 1600 -- -- -- -- -- -- '5\' 10"'$  (1.778 m) 88.5 kg  04/09/22 1551 (!) 143/91 97.9 F (36.6 C) Oral 74 20 96 % -- --  04/09/22 1530 129/84 -- -- 76 11 94 % -- --  04/09/22 1515 (!) 127/95 -- -- 71 16 96 % -- --  04/09/22 1430 (!) 126/56 97.7 F (36.5 C) -- 74 14 95 % -- --  04/09/22 1415 136/90 -- -- 75 (!) 22 94 % -- --  04/09/22 1400 136/86 -- -- -- 20 95 % -- --  04/09/22 1345 136/88 -- -- -- -- -- -- --  04/09/22 1330 136/89 97.7 F (36.5 C) -- -- 17 97 % -- --  04/09/22 1315 135/88 -- -- 72 14 95 % -- --  04/09/22 1306 (!) 147/83 (!) 97.3 F (36.3 C) -- 71 18 100 % -- --  04/09/22 0948 128/72 -- -- 72 17 93 % -- --     Recent laboratory studies:  Recent Labs    04/08/22 0840 04/10/22 0348  WBC 3.2*  --   HGB 11.9* 10.4*  HCT 38.9* 35.1*  PLT 88*  --   NA  --  143  K  --  4.7  CL  --  112*  CO2  --  25  BUN  --  15  CREATININE  --  1.13  GLUCOSE  --  138*  CALCIUM  --  8.7*     Discharge Medications:   Allergies as of 04/10/2022       Reactions   Aspirin Other (See Comments)   perforated ulcer   Cymbalta [duloxetine Hcl] Other (See Comments)   Over-sedating; slept over 24 hours with one dose.  Possible serotonin syndrome.   Nsaids Other (See Comments)   Perforated ulcer   Tape Rash   Surgical tape   Hydrocodone Other (See Comments)   headache   Vancomycin Rash   Red man syndrome.  Please premedicate.        Medication List     STOP taking these medications    oxyCODONE-acetaminophen 10-325 MG tablet Commonly known as: PERCOCET       TAKE these medications    acetaminophen 500 MG tablet Commonly known as: TYLENOL Take 1,000 mg by mouth every 6 (six) hours as needed for mild pain.   albuterol 108 (90 Base) MCG/ACT inhaler Commonly known as: VENTOLIN HFA Inhale 2 puffs into the lungs every 6 (six) hours as needed for wheezing or shortness of breath.   allopurinol 100 MG tablet Commonly known  as: ZYLOPRIM Take 1 tablet (100 mg total) by mouth daily.   apixaban  2.5 MG Tabs tablet Commonly known as: ELIQUIS Take 1 tablet (2.5 mg total) by mouth 2 (two) times daily.   celecoxib 200 MG capsule Commonly known as: CELEBREX Take 200 mg by mouth daily as needed for mild pain or moderate pain.   colchicine 0.6 MG tablet TAKE ONE TABLET BY MOUTH DAILY AS NEEDED FOR GOUT   docusate sodium 100 MG capsule Commonly known as: Colace Take 1 capsule (100 mg total) by mouth 2 (two) times daily as needed for mild constipation.   E-Z Spacer inhaler Use as instructed   escitalopram 20 MG tablet Commonly known as: LEXAPRO Take 20 mg by mouth every morning.   folic acid 1 MG tablet Commonly known as: FOLVITE Take 1 tablet (1 mg total) by mouth daily.   HYDROmorphone 2 MG tablet Commonly known as: DILAUDID Take 2 tablets (4 mg total) by mouth every 4 (four) hours as needed for up to 7 days for severe pain (pain score 7-10).   KRILL & FISH OIL BLEND PO Take 1 capsule by mouth daily.   lamoTRIgine 100 MG tablet Commonly known as: LAMICTAL Take 150 mg by mouth 2 (two) times daily.   losartan 25 MG tablet Commonly known as: Cozaar Take 1 tablet (25 mg total) by mouth daily. Take if SBP >130 mmHg in am and take additional dose at night if SBP >130 mmHg What changed:  when to take this reasons to take this   methocarbamol 500 MG tablet Commonly known as: ROBAXIN Take 500 mg by mouth daily as needed for muscle spasms.   nitroGLYCERIN 0.3 MG SL tablet Commonly known as: Nitrostat Place 1 tablet (0.3 mg total) under the tongue every 5 (five) minutes as needed for chest pain.   polyethylene glycol 17 g packet Commonly known as: MIRALAX / GLYCOLAX Take 17 g by mouth daily.   pregabalin 150 MG capsule Commonly known as: LYRICA Take 150 mg by mouth 3 (three) times daily.   tamsulosin 0.4 MG Caps capsule Commonly known as: FLOMAX Take 0.4 mg by mouth daily as needed (kidney  stones).   traZODone 100 MG tablet Commonly known as: DESYREL Take 300 mg by mouth at bedtime as needed for sleep.   vitamin E 180 MG (400 UNITS) capsule Take 400 Units by mouth daily.   zinc gluconate 50 MG tablet Take 1 tablet (50 mg total) by mouth daily.        Diagnostic Studies: No results found.  Disposition: Discharge disposition: 01-Home or Self Care       Discharge Instructions     Call MD / Call 911   Complete by: As directed    If you experience chest pain or shortness of breath, CALL 911 and be transported to the hospital emergency room.  If you develope a fever above 101 F, pus (white drainage) or increased drainage or redness at the wound, or calf pain, call your surgeon's office.   Constipation Prevention   Complete by: As directed    Drink plenty of fluids.  Prune juice may be helpful.  You may use a stool softener, such as Colace (over the counter) 100 mg twice a day.  Use MiraLax (over the counter) for constipation as needed.   Diet - low sodium heart healthy   Complete by: As directed    Increase activity slowly as tolerated   Complete by: As directed    Post-operative opioid taper instructions:   Complete by: As directed    POST-OPERATIVE OPIOID TAPER  INSTRUCTIONS: It is important to wean off of your opioid medication as soon as possible. If you do not need pain medication after your surgery it is ok to stop day one. Opioids include: Codeine, Hydrocodone(Norco, Vicodin), Oxycodone(Percocet, oxycontin) and hydromorphone amongst others.  Long term and even short term use of opiods can cause: Increased pain response Dependence Constipation Depression Respiratory depression And more.  Withdrawal symptoms can include Flu like symptoms Nausea, vomiting And more Techniques to manage these symptoms Hydrate well Eat regular healthy meals Stay active Use relaxation techniques(deep breathing, meditating, yoga) Do Not substitute Alcohol to help with  tapering If you have been on opioids for less than two weeks and do not have pain than it is ok to stop all together.  Plan to wean off of opioids This plan should start within one week post op of your joint replacement. Maintain the same interval or time between taking each dose and first decrease the dose.  Cut the total daily intake of opioids by one tablet each day Next start to increase the time between doses. The last dose that should be eliminated is the evening dose.           Follow-up Information     Susa Day, MD Follow up in 2 week(s).   Specialty: Orthopedic Surgery Contact information: [address] [place] [address] [phone]                  Signed: Cecilie Kicks 04/10/2022, 9:37 AM

## 2022-04-10 NOTE — Plan of Care (Signed)

## 2022-04-10 NOTE — Evaluation (Signed)
Occupational Therapy Evaluation Patient Details Name: Hector [name] MRN: [id number] DOB: 09-04-1961 Today's Date: 04/10/2022   History of Present Illness Hector [name] is a 61 y/o male as outpatient in bed on 04/09/22 with diagnosis of Left avascular necrosis humeral head and rotator cuff tear. Now s/p LEFT SHOULDER ARTHROSCOPY WITH MINI OPEN ROTATOR CUFF REPAIR, SUBACROMIAL DECOMPRESSION, POSSIBLE DISTAL CLAVICLE RESECTION AND BONE GRAFTING HUMERAL HEAD WITH ALLOGRAFT AND AUTOLOGOUS BONE. PMH includes: LFTs, ADJ DISORDER WITH MIXED ANXIETY & DEPRESSED MOOD (08/23/2009), Anxiety, Ascending aortic aneurysm, Bipolar disorder, Carpal tunnel syndrome, Closed head injury, DDD, DM, Gout, History of bunionectomy of left great toe, History of concussion, Hyperlipidemia, Hypertension, Neuromuscular disorder, OSA, Osteoarthritis of knee, Peripheral neuropathy, PTSD, Pulmonary embolism, and remote Lumbar radiculopathy (10/01/2021).   Clinical Impression   Pt is typically independent in ADL and mobility. Enjoys fishing. Today he still has some numbness from the block but is starting to "tingle" Shoulder handout reviewed in full including: sling wearing schedule, positioning, don/doff, how to shower and safety strategies, how to dress: sequencing, clothing choices to make it easier, sleeping: to keep sling on and recliner is usually more comfortable (Pt typically sleeps in specialized recliner due to chronic back pain), Exercises for hand, wrist, elbow and pendulums. Educated on NWB status. Pt verbalized understanding for all aspects of shoulder education.  OT complete and will sign off acutely. Please progress therapy as ordered by MD at follow up appointment.      Recommendations for follow up therapy are one component of a multi-disciplinary discharge planning process, led by the attending physician.  Recommendations may be updated based on patient status, additional functional criteria and insurance authorization.    Follow Up Recommendations  Follow physician's recommendations for discharge plan and follow up therapies    Assistance Recommended at Discharge PRN  Patient can return home with the following A little help with bathing/dressing/bathroom;Assistance with cooking/housework;Assist for transportation    Functional Status Assessment  Patient has had a recent decline in their functional status and demonstrates the ability to make significant improvements in function in a reasonable and predictable amount of time.  Equipment Recommendations  None recommended by OT    Recommendations for Other Services       Precautions / Restrictions Precautions Precautions: Shoulder Type of Shoulder Precautions: conservative, handout provided Shoulder Interventions: Shoulder sling/immobilizer;At all times;Off for dressing/bathing/exercises Precaution Booklet Issued: Yes (comment) Required Braces or Orthoses: Sling Restrictions Weight Bearing Restrictions: Yes LUE Weight Bearing: Non weight bearing      Mobility Bed Mobility Overal bed mobility: Modified Independent                  Transfers Overall transfer level: Modified independent                        Balance Overall balance assessment: No apparent balance deficits (not formally assessed)                                         ADL either performed or assessed with clinical judgement   ADL Overall ADL's : Needs assistance/impaired Eating/Feeding: Modified independent;Sitting   Grooming: Supervision/safety;Standing                   Toilet Transfer: Supervision/safety;Ambulation   Toileting- Clothing Manipulation and Hygiene: Minimal assistance;Sit to/from stand Toileting - Clothing Manipulation Details (indicate cue type and  reason): to manage underwear and pants over sticky socks Tub/ Shower Transfer: Supervision/safety   Functional mobility during ADLs: Supervision/safety General  ADL Comments: see shoulder section below for more info     Vision Baseline Vision/History: 1 Wears glasses Ability to See in Adequate Light: 0 Adequate Patient Visual Report: No change from baseline Vision Assessment?: No apparent visual deficits     Perception     Praxis      Pertinent Vitals/Pain Pain Assessment Pain Assessment: Faces Faces Pain Scale: Hurts a little bit (starting to tingle) Pain Location: L shoulder Pain Descriptors / Indicators: Tingling Pain Intervention(s): Monitored during session, Limited activity within patient's tolerance, Patient requesting pain meds-RN notified, Ice applied     Hand Dominance Right   Extremity/Trunk Assessment Upper Extremity Assessment Upper Extremity Assessment: LUE deficits/detail LUE Deficits / Details: post-op deficits as anticipated LUE: Unable to fully assess due to immobilization LUE Sensation: decreased light touch (block still wearing off) LUE Coordination: decreased gross motor   Lower Extremity Assessment Lower Extremity Assessment: Overall WFL for tasks assessed (hx of peripheral neuropathy)   Cervical / Trunk Assessment Cervical / Trunk Assessment: Other exceptions Cervical / Trunk Exceptions: his of back sx and chronic back pain   Communication Communication Communication: No difficulties   Cognition Arousal/Alertness: Awake/alert Behavior During Therapy: WFL for tasks assessed/performed Overall Cognitive Status: Within Functional Limits for tasks assessed                                 General Comments: friendly, former paramedic     General Comments       Exercises Exercises: Shoulder Shoulder Exercises Pendulum Exercise: PROM, Left, 10 reps, Seated, Standing (educated not demonstrated) Elbow Flexion: AAROM, Left, 10 reps, Seated, Standing Elbow Extension: AAROM, Left, 10 reps, Seated, Standing Wrist Flexion: AROM, Left Wrist Extension: AROM, Left Digit Composite Flexion: AROM,  Left Composite Extension: AROM, Left Neck Flexion: AROM Neck Extension: AROM Neck Lateral Flexion - Right: AROM Neck Lateral Flexion - Left: AROM   Shoulder Instructions Shoulder Instructions Donning/doffing shirt without moving shoulder: Moderate assistance Method for sponge bathing under operated UE: Supervision/safety Donning/doffing sling/immobilizer: Minimal assistance Correct positioning of sling/immobilizer: Modified independent Pendulum exercises (written home exercise program): Supervision/safety ROM for elbow, wrist and digits of operated UE: Supervision/safety Sling wearing schedule (on at all times/off for ADL's): Independent Proper positioning of operated UE when showering: Supervision/safety Positioning of UE while sleeping: Modified independent    Home Living Family/patient expects to be discharged to:: Private residence Living Arrangements: Spouse/significant other Available Help at Discharge: Family;Available 24 hours/day Type of Home: House Home Access: Stairs to enter [employer] of Steps: 1 Entrance Stairs-Rails: None Home Layout: One level (bonus room above garage)     Bathroom Shower/Tub: Occupational psychologist: Handicapped height Bathroom Accessibility: Yes How Accessible: Accessible via walker Home Equipment: Conservation officer, nature (2 wheels);Cane - single point;Shower seat;BSC/3in1;Hand held shower head;Other (comment) (specialized recliner chair, EVA walker)   Additional Comments: enjoys fishing, has a support dog/therapy dog      Prior Functioning/Environment Prior Level of Function : Independent/Modified Independent;Driving                        OT Problem List: Decreased range of motion;Impaired UE functional use;Pain;Impaired sensation      OT Treatment/Interventions:      OT Goals(Current goals can be found in the care plan section)  Acute Rehab OT Goals Patient Stated Goal: get back to fishing OT Goal  Formulation: With patient Time For Goal Achievement: 04/24/22 Potential to Achieve Goals: Good  OT Frequency:      Co-evaluation              AM-PAC OT "6 Clicks" Daily Activity     Outcome Measure Help from another person eating meals?: None Help from another person taking care of personal grooming?: None Help from another person toileting, which includes using toliet, bedpan, or urinal?: A Little Help from another person bathing (including washing, rinsing, drying)?: None Help from another person to put on and taking off regular upper body clothing?: A Little Help from another person to put on and taking off regular lower body clothing?: None 6 Click Score: 22   End of Session Equipment Utilized During Treatment: Other (comment) (sling) Nurse Communication: Mobility status;Weight bearing status;Precautions;Patient requests pain meds  Activity Tolerance: Patient tolerated treatment well Patient left: in chair;with call bell/phone within reach;with nursing/sitter in room  OT Visit Diagnosis: Pain Pain - Right/Left: Left Pain - part of body: Shoulder                Time: 1103-1594 OT Time Calculation (min): 45 min Charges:  OT General Charges $OT Visit: 1 Visit OT Evaluation $OT Eval Moderate Complexity: 1 Mod OT Treatments $Self Care/Home Management : 8-22 mins  Jesse Sans OTR/L Acute Rehabilitation Services Office: [place] 04/10/2022, 11:43 AM

## 2022-04-10 NOTE — Progress Notes (Signed)
Transition of Care [facility]) Screening Note  Patient Details  Name: Hector [name] Date of Birth: Dec 17, 1960  Transition [facility]) CM/SW Contact:    Sherie Don, LCSW Phone Number: 04/10/2022, 11:55 AM  Transition of Care Department [facility]) has reviewed patient and no TOC needs have been identified at this time. We will continue to monitor patient advancement through interdisciplinary progression rounds. If new patient transition needs arise, please place a TOC consult.

## 2022-04-22 DIAGNOSIS — G4731 Primary central sleep apnea: Secondary | ICD-10-CM | POA: Diagnosis not present

## 2022-04-29 ENCOUNTER — Telehealth: Payer: Self-pay | Admitting: Neurology

## 2022-04-29 NOTE — Telephone Encounter (Signed)
Pt was scheduled for his initial BIPAP on (06-23-22) Pt was informed to bring machine and power cord to the appointment.   DME and between dates are in pt's SnapShot.

## 2022-05-23 DIAGNOSIS — G4731 Primary central sleep apnea: Secondary | ICD-10-CM | POA: Diagnosis not present

## 2022-05-27 ENCOUNTER — Ambulatory Visit: Payer: Federal, State, Local not specified - PPO | Admitting: Neurology

## 2022-05-27 ENCOUNTER — Telehealth: Payer: Self-pay | Admitting: Neurology

## 2022-05-27 NOTE — Telephone Encounter (Signed)
Pt just called and cancel appointment. Stated father passed away this morning.

## 2022-06-04 DIAGNOSIS — M25512 Pain in left shoulder: Secondary | ICD-10-CM | POA: Diagnosis not present

## 2022-06-04 DIAGNOSIS — Z4889 Encounter for other specified surgical aftercare: Secondary | ICD-10-CM | POA: Diagnosis not present

## 2022-06-12 DIAGNOSIS — F4310 Post-traumatic stress disorder, unspecified: Secondary | ICD-10-CM | POA: Diagnosis not present

## 2022-06-12 DIAGNOSIS — F3132 Bipolar disorder, current episode depressed, moderate: Secondary | ICD-10-CM | POA: Diagnosis not present

## 2022-06-22 DIAGNOSIS — G4731 Primary central sleep apnea: Secondary | ICD-10-CM | POA: Diagnosis not present

## 2022-06-23 ENCOUNTER — Ambulatory Visit: Payer: Federal, State, Local not specified - PPO | Admitting: Neurology

## 2022-07-17 ENCOUNTER — Ambulatory Visit (INDEPENDENT_AMBULATORY_CARE_PROVIDER_SITE_OTHER): Payer: Federal, State, Local not specified - PPO | Admitting: Internal Medicine

## 2022-07-17 ENCOUNTER — Encounter: Payer: Self-pay | Admitting: Internal Medicine

## 2022-07-17 VITALS — BP 126/79 | HR 83 | Ht 70.0 in | Wt 198.0 lb

## 2022-07-17 DIAGNOSIS — Z1211 Encounter for screening for malignant neoplasm of colon: Secondary | ICD-10-CM | POA: Diagnosis not present

## 2022-07-17 DIAGNOSIS — Z0000 Encounter for general adult medical examination without abnormal findings: Secondary | ICD-10-CM

## 2022-07-17 DIAGNOSIS — E785 Hyperlipidemia, unspecified: Secondary | ICD-10-CM

## 2022-07-17 DIAGNOSIS — E538 Deficiency of other specified B group vitamins: Secondary | ICD-10-CM

## 2022-07-17 DIAGNOSIS — I10 Essential (primary) hypertension: Secondary | ICD-10-CM

## 2022-07-17 DIAGNOSIS — K219 Gastro-esophageal reflux disease without esophagitis: Secondary | ICD-10-CM

## 2022-07-17 DIAGNOSIS — D696 Thrombocytopenia, unspecified: Secondary | ICD-10-CM

## 2022-07-17 DIAGNOSIS — Z125 Encounter for screening for malignant neoplasm of prostate: Secondary | ICD-10-CM

## 2022-07-17 DIAGNOSIS — M5416 Radiculopathy, lumbar region: Secondary | ICD-10-CM

## 2022-07-17 DIAGNOSIS — G4733 Obstructive sleep apnea (adult) (pediatric): Secondary | ICD-10-CM | POA: Diagnosis not present

## 2022-07-17 DIAGNOSIS — M109 Gout, unspecified: Secondary | ICD-10-CM

## 2022-07-17 LAB — POCT URINALYSIS DIPSTICK
Bilirubin, UA: NEGATIVE
Blood, UA: NEGATIVE
Glucose, UA: NEGATIVE
Ketones, UA: POSITIVE
Leukocytes, UA: NEGATIVE
Nitrite, UA: NEGATIVE
Protein, UA: NEGATIVE
Spec Grav, UA: 1.025 (ref 1.010–1.025)
Urobilinogen, UA: 0.2 E.U./dL
pH, UA: 6 (ref 5.0–8.0)

## 2022-07-17 MED ORDER — ALLOPURINOL 100 MG PO TABS: 100.0000 mg | ORAL_TABLET | Freq: Every day | ORAL | 1 refills | Status: DC

## 2022-07-17 MED ORDER — METHOCARBAMOL 500 MG PO TABS: 500.0000 mg | ORAL_TABLET | Freq: Every day | ORAL | 0 refills | Status: AC | PRN

## 2022-07-17 NOTE — Progress Notes (Signed)
Date:  07/17/2022   Name:  Hector [name]   DOB:  1960/11/23   MRN:  [id number]   Chief Complaint: Annual Exam Hector [name] is a 61 y.o. male who presents today for his Complete Annual Exam. He feels well. He reports exercising walking daily . He reports he is sleeping fairly well.   Colonoscopy: 04/2014  Immunization History  Administered Date(s) Administered   Hep A / Hep B 02/23/2018   Influenza Split 07/26/2009, 09/06/2012   Influenza Whole 07/26/2009   Influenza,inj,Quad PF,6+ Mos 08/01/2013, 05/15/2014, 05/14/2015   Influenza,inj,Quad PF,6-35 Mos 10/18/2020   MMR 02/26/2018   PFIZER(Purple Top)SARS-COV-2 Vaccination 10/26/2019, 11/16/2019   PPD Test 02/23/2018   Pneumococcal Conjugate-13 09/06/2012   Pneumococcal Polysaccharide-23 05/08/2021   Tdap 06/07/2011, 05/08/2021   Health Maintenance Due  Topic Date Due   Zoster Vaccines- Shingrix (1 of 2) Never done    Lab Results  Component Value Date   PSA 0.94 05/17/2020   PSA 0.81 02/23/2018   PSA 0.76 04/09/2015     Hypertension This is a chronic problem. The problem is controlled. Pertinent negatives include no chest pain, headaches, palpitations or shortness of breath. Past treatments include angiotensin blockers. The current treatment provides significant improvement. Hypertensive end-organ damage includes kidney disease. There is no history of CAD/MI or CVA.    Lab Results  Component Value Date   NA 143 04/10/2022   K 4.7 04/10/2022   CO2 25 04/10/2022   GLUCOSE 138 (H) 04/10/2022   BUN 15 04/10/2022   CREATININE 1.13 04/10/2022   CALCIUM 8.7 (L) 04/10/2022   EGFR 57 (L) 04/04/2022   GFRNONAA >60 04/10/2022   Lab Results  Component Value Date   CHOL 110 05/17/2020   HDL 62 05/17/2020   LDLCALC 13 05/17/2020   LDLDIRECT 113.2 04/01/2007   TRIG 176 (A) 05/17/2020   CHOLHDL 3 02/23/2018   Lab Results  Component Value Date   TSH 0.525 04/01/2021   Lab Results  Component Value Date   HGBA1C 5.4  04/08/2022   Lab Results  Component Value Date   WBC 3.2 (L) 04/08/2022   HGB 10.4 (L) 04/10/2022   HCT 35.1 (L) 04/10/2022   MCV 93.1 04/08/2022   PLT 88 (L) 04/08/2022   Lab Results  Component Value Date   ALT 22 04/04/2022   AST 28 04/04/2022   ALKPHOS 145 (H) 04/04/2022   BILITOT 0.3 04/04/2022   Lab Results  Component Value Date   VD25OH 24.0 (L) 03/20/2016     Review of Systems  Constitutional:  Negative for appetite change, chills, diaphoresis, fatigue and unexpected weight change.  HENT:  Positive for mouth sores (lower lip blisters from windburn). Negative for hearing loss, tinnitus, trouble swallowing and voice change.   Eyes:  Negative for visual disturbance.  Respiratory:  Negative for choking, shortness of breath and wheezing.   Cardiovascular:  Negative for chest pain, palpitations and leg swelling.  Gastrointestinal:  Negative for abdominal pain, blood in stool, constipation and diarrhea.  Genitourinary:  Negative for difficulty urinating, dysuria and frequency.  Musculoskeletal:  Positive for arthralgias and back pain. Negative for myalgias.  Skin:  Negative for color change and rash.  Neurological:  Negative for dizziness, syncope and headaches.  Hematological:  Negative for adenopathy.  Psychiatric/Behavioral:  Positive for sleep disturbance. Negative for dysphoric mood. The patient is not nervous/anxious.     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   Dysautonomia orthostatic hypotension syndrome 08/01/2021   Ataxia on examination 08/01/2021   Postural dizziness with presyncope 08/01/2021   Anemia due to zinc deficiency 12/02/2020   Anemia due to acquired thiamine deficiency 12/02/2020   Elevated LFTs 11/26/2020   Dietary folate deficiency anemia 11/26/2020   Ascending aortic  aneurysm (HCC)    Osteoarthritis of knee 11/02/2018   History of pulmonary embolus (PE) 09/24/2018   Primary hypertension 02/27/2017   Perennial allergic rhinitis 11/26/2015   Renal calculi    Lumbar back pain with radiculopathy affecting left lower extremity 05/15/2014   OSA (obstructive sleep apnea) 12/12/2013   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   GERD (gastroesophageal reflux disease) 12/28/2012   Thrombocytopenia ([place]) 05/24/2010   Gout, arthritis 08/02/2007   B12 deficiency 04/06/2007   Mild hyperlipidemia 04/06/2007    Allergies  Allergen Reactions   Aspirin Other (See Comments)    perforated ulcer   Cymbalta [Duloxetine Hcl] Other (See Comments)    Over-sedating; slept over 24 hours with one dose.  Possible serotonin syndrome.   Nsaids Other (See Comments)    Perforated ulcer   Tape Rash    Surgical tape   Hydrocodone Other (See Comments)    headache   Vancomycin Rash    Red man syndrome.  Please premedicate.    Past Surgical History:  Procedure Laterality Date   ABDOMINAL EXPOSURE N/A 03/29/2020   Procedure: ABDOMINAL EXPOSURE;  Surgeon: Rosetta Posner, MD;  Location: [facility] OR;  Service: Vascular;  Laterality: N/A;   ANTERIOR LUMBAR FUSION N/A 03/29/2020   Procedure: ANTERIOR LUMBAR FUSION (ALIF) L4-5;  Surgeon: Melina Schools, MD;  Location: [place];  Service: Orthopedics;  Laterality: N/A;  5.5 hrs Dr. Donnetta Hutching do approach Lt tap block with exparel   BACK SURGERY  2012   Dr. Dorina Hoyer disk   CARPAL TUNNEL RELEASE  2008   left-ulnar nerve decomp   carpel tunnel Left    CHOLECYSTECTOMY N/A 03/21/2016   Procedure: LAPAROSCOPIC CHOLECYSTECTOMY WITH INTRAOPERATIVE CHOLANGIOGRAM;  Surgeon: Ralene Ok, MD;  Location: [facility];  Service: General;  Laterality: N/A;   colonscopy      DEBRIDEMENT AND CLOSURE WOUND N/A 05/09/2020   Procedure: Irrigation and debridement of superficial wound closure;  Surgeon: Melina Schools, MD;  Location: [place];  Service: Orthopedics;   Laterality: N/A;  1 hr   DIAGNOSTIC LAPAROSCOPY     laparoscopic gastric bypass   EPIDURAL BLOCK INJECTION     by Dr. Nelva Bush   FOOT SURGERY     bilat for removal of extra bone    GASTRIC BYPASS  11/06/09   beginning weight 267   HARDWARE REMOVAL N/A 05/02/2016   Procedure: Removal of hardware Lumbar five-Sacral one with Metrex;  Surgeon: Kristeen Miss, MD;  Location: [facility];  Service: Neurosurgery;  Laterality: N/A;  Removal of hardware L5-S1 with Metrex   HERNIA REPAIR  1093   umbilical   HIATAL HERNIA REPAIR N/A 01/20/2013   Procedure: LAPAROSCOPIC REPAIR OF INTERNAL HERNIA;  Surgeon: Shann Medal, MD;  Location: [facility];  Service: General;  Laterality: N/A;   LUMBAR LAMINECTOMY/DECOMPRESSION MICRODISCECTOMY Left 06/07/2014   Procedure: MICRO LUMBAR DECOMPRESSION L5 - S1 ON THE LEFT/L5 FORAMINOTOMY/EXCISION SYNOVIAL CYST  1 LEVEL;  Surgeon: [name], MD;  Location: [facility];  Service: Orthopedics;  Laterality: Left;   MASS EXCISION Right 04/18/2013  Procedure: RIGHT SMALL FINGER SKIN LESION EXCISION;  Surgeon: Jolyn Nap, MD;  Location: [place];  Service: Orthopedics;  Laterality: Right;   NASAL SEPTOPLASTY W/ TURBINOPLASTY     x 2   NEPHROLITHOTOMY Right 11/03/2014   Procedure: RIGHT PERCUTANEOUS NEPHROLITHOTOMY;  Surgeon: Claybon Jabs, MD;  Location: [facility];  Service: Urology;  Laterality: Right;   SHOULDER ARTHROSCOPY  2014   rt   SHOULDER ARTHROSCOPY WITH ROTATOR CUFF REPAIR AND SUBACROMIAL DECOMPRESSION Left 04/09/2022   Procedure: LEFT SHOULDER ARTHROSCOPY WITH MINI OPEN ROTATOR CUFF REPAIR, SUBACROMIAL DECOMPRESSION, POSSIBLE DISTAL CLAVICLE RESECTION AND BONE GRAFTING HUMERAL HEAD WITH ALLOGRAFT AND AUTOLOGOUS BONE;  Surgeon: Susa Day, MD;  Location: [facility];  Service: Orthopedics;  Laterality: Left;  2 hrs   surgerical repair of stomach ulcer  06/08/10   per   TONSILLECTOMY     UVULOPALATOPHARYNGOPLASTY  2007   with tonsils   VASECTOMY       Social History   Tobacco Use   Smoking status: Never   Smokeless tobacco: Never  Vaping Use   Vaping Use: Never used  Substance Use Topics   Alcohol use: Yes    Comment: occas liquor    Drug use: No     Medication list has been reviewed and updated.  Current Meds  Medication Sig   acetaminophen (TYLENOL) 500 MG tablet Take 1,000 mg by mouth every 6 (six) hours as needed for mild pain.   albuterol (PROVENTIL HFA;VENTOLIN HFA) 108 (90 Base) MCG/ACT inhaler Inhale 2 puffs into the lungs every 6 (six) hours as needed for wheezing or shortness of breath.   allopurinol (ZYLOPRIM) 100 MG tablet Take 1 tablet (100 mg total) by mouth daily.   celecoxib (CELEBREX) 200 MG capsule Take 200 mg by mouth daily as needed for mild pain or moderate pain.   colchicine 0.6 MG tablet TAKE ONE TABLET BY MOUTH DAILY AS NEEDED FOR GOUT   docusate sodium (COLACE) 100 MG capsule Take 1 capsule (100 mg total) by mouth 2 (two) times daily as needed for mild constipation.   escitalopram (LEXAPRO) 20 MG tablet Take 20 mg by mouth every morning.    Fish Oil-Krill Oil (KRILL & FISH OIL BLEND PO) Take 1 capsule by mouth daily.   folic acid (FOLVITE) 1 MG tablet Take 1 tablet (1 mg total) by mouth daily.   HYDROmorphone (DILAUDID) 2 MG tablet take 1-2 tabs every 4-6 hrs as needed for pain   HYDROmorphone (DILAUDID) 4 MG tablet Take 1 tablet every 4-6 hours by oral route as needed for 7 days.   lamoTRIgine (LAMICTAL) 100 MG tablet Take 150 mg by mouth 2 (two) times daily.    losartan (COZAAR) 25 MG tablet Take 1 tablet (25 mg total) by mouth daily. Take if SBP >130 mmHg in am and take additional dose at night if SBP >130 mmHg (Patient taking differently: Take 25 mg by mouth as needed. Take if SBP >130 mmHg in am and take additional dose at night if SBP >130 mmHg)   methocarbamol (ROBAXIN) 500 MG tablet Take 500 mg by mouth daily as needed for muscle spasms.   Multiple Vitamin (MULTIVITAMIN) capsule Take 1 capsule  by mouth daily.   nitroGLYCERIN (NITROSTAT) 0.3 MG SL tablet Place 1 tablet (0.3 mg total) under the tongue every 5 (five) minutes as needed for chest pain.   oxyCODONE-acetaminophen (PERCOCET) 10-325 MG tablet Take 1 tablet by mouth 4 (four) times daily as needed for pain.  polyethylene glycol (MIRALAX / GLYCOLAX) 17 g packet Take 17 g by mouth daily.   pregabalin (LYRICA) 150 MG capsule Take 150 mg by mouth 3 (three) times daily.   Spacer/Aero-Holding Chambers (E-Z SPACER) inhaler Use as instructed   tamsulosin (FLOMAX) 0.4 MG CAPS capsule Take 0.4 mg by mouth daily as needed (kidney stones).    traZODone (DESYREL) 100 MG tablet Take 300 mg by mouth at bedtime as needed for sleep.   vitamin E 180 MG (400 UNITS) capsule Take 400 Units by mouth daily.   zinc gluconate 50 MG tablet Take 1 tablet (50 mg total) by mouth daily.       07/17/2022    9:46 AM 04/04/2022   10:16 AM  GAD 7 : Generalized Anxiety Score  Nervous, Anxious, on Edge 1 1  Control/stop worrying 1 0  Worry too much - different things 0 0  Trouble relaxing 1 0  Restless 1 0  Easily annoyed or irritable 1 0  Afraid - awful might happen 0 0  Total GAD 7 Score 5 1  Anxiety Difficulty Not difficult at all Not difficult at all       07/17/2022    9:46 AM 04/04/2022   10:16 AM 06/03/2017    9:52 AM  Depression screen PHQ 2/9  Decreased Interest 0 0 0  Down, Depressed, Hopeless 1 0 0  PHQ - 2 Score 1 0 0  Altered sleeping 1 0   Tired, decreased energy 1 0   Change in appetite 0 0   Feeling bad or failure about yourself  0 0   Trouble concentrating 0 0   Moving slowly or fidgety/restless 1 0   Suicidal thoughts 0 0   PHQ-9 Score 4 0   Difficult doing work/chores Somewhat difficult Not difficult at all     BP Readings from Last 3 Encounters:  07/17/22 126/79  04/10/22 139/89  04/08/22 (!) 160/95    Physical Exam Vitals and nursing note reviewed.  Constitutional:      Appearance: Normal appearance. He is  well-developed.  HENT:     Head: Normocephalic.     Right Ear: Tympanic membrane, ear canal and external ear normal.     Left Ear: Tympanic membrane, ear canal and external ear normal.     Nose: Nose normal.     Mouth/Throat:   Eyes:     Conjunctiva/sclera: Conjunctivae normal.     Pupils: Pupils are equal, round, and reactive to light.  Neck:     Thyroid: No thyromegaly.     Vascular: No carotid bruit.  Cardiovascular:     Rate and Rhythm: Normal rate and regular rhythm.     Heart sounds: Normal heart sounds.  Pulmonary:     Effort: Pulmonary effort is normal.     Breath sounds: Normal breath sounds. No wheezing.  Chest:  Breasts:    Right: No mass.     Left: No mass.  Abdominal:     General: Bowel sounds are normal.     Palpations: Abdomen is soft.     Tenderness: There is no abdominal tenderness.  Musculoskeletal:        General: Normal range of motion.     Cervical back: Normal range of motion and neck supple.  Lymphadenopathy:     Cervical: No cervical adenopathy.  Skin:    General: Skin is warm and dry.  Neurological:     Mental Status: He is alert and oriented to person, place, and time.  Deep Tendon Reflexes: Reflexes are normal and symmetric.  Psychiatric:        Attention and Perception: Attention normal.        Mood and Affect: Mood normal.        Thought Content: Thought content normal.     Wt Readings from Last 3 Encounters:  07/17/22 198 lb (89.8 kg)  04/09/22 195 lb 1.7 oz (88.5 kg)  04/08/22 195 lb (88.5 kg)    BP 126/79   Pulse 83   Ht _0  (1.778 m)   Wt 198 lb (89.8 kg)   SpO2 96%   BMI 28.41 kg/m   Assessment and Plan: 1. Annual physical exam Stable physical exam. Recommend vaseline to lip lesions until healed - CBC with Differential/Platelet - Comprehensive metabolic panel - Lipid panel - PSA - TSH - Vitamin B12  2. Prostate cancer screening DRE deferred - PSA  3. Colon cancer screening Last done in 2018 and was  normal Repeat in 2028  4. Primary hypertension Clinically stable exam with well controlled BP. Tolerating medications without side effects at this time.  Losartan 50 mg. Pt to continue current regimen and low sodium diet; benefits of regular exercise as able discussed. - CBC with Differential/Platelet - Comprehensive metabolic panel - TSH - POCT urinalysis dipstick  5. Gastroesophageal reflux disease without esophagitis Not currently on PPI - hx of ulcers after gastric bypass - CBC with Differential/Platelet  6. Thrombocytopenia ([place]) Continue to monitor for change No bleeding issues noted - CBC with Differential/Platelet  7. Mild hyperlipidemia Check labs and advise on medication - Lipid panel  8. B12 deficiency Needs recheck since stopping in 2022  - Vitamin B12  9. Gout, arthritis Gout is well controlled with diet, alcohol avoidance and daily allopurinol. - allopurinol (ZYLOPRIM) 100 MG tablet; Take 1 tablet (100 mg total) by mouth daily.  Dispense: 90 tablet; Refill: 1  10. Obstructive sleep apnea S/p uvulectomy Follow up with Neurology and with ENT for nasal obstructive symptoms   11. Lumbar back pain with radiculopathy affecting left lower extremity Followed by pain management on - methocarbamol (ROBAXIN) 500 MG tablet; Take 1 tablet (500 mg total) by mouth daily as needed for muscle spasms.  Dispense: 90 tablet; Refill: 0   Partially dictated using Editor, commissioning. Any errors are unintentional.  Halina Maidens, MD [facility]  07/17/2022

## 2022-07-18 NOTE — Addendum Note (Signed)
Addended by: Glean Hess on: 07/18/2022 12:41 PM   Modules accepted: Level of Service

## 2022-07-25 ENCOUNTER — Other Ambulatory Visit: Payer: Self-pay | Admitting: Family Medicine

## 2022-07-25 DIAGNOSIS — M545 Low back pain, unspecified: Secondary | ICD-10-CM

## 2022-07-29 DIAGNOSIS — F4312 Post-traumatic stress disorder, chronic: Secondary | ICD-10-CM | POA: Diagnosis not present

## 2022-08-04 ENCOUNTER — Telehealth: Payer: Self-pay

## 2022-08-04 ENCOUNTER — Encounter: Payer: Self-pay | Admitting: Internal Medicine

## 2022-08-04 NOTE — Telephone Encounter (Signed)
Called pt as a reminder to get labs done. Pt verbalized understanding.   KP

## 2022-08-04 NOTE — Telephone Encounter (Signed)
Pt wants a call bk from [facility] in the morning... [phone]

## 2022-08-04 NOTE — Telephone Encounter (Signed)
Called patient to remind him to get his labs drawn. Patient said he did not get his labs done because he owes $125 dollars before he can have them done. He said he will go to another [facility]. Patient then asked for a handicap plate. Patient said he currently has 2 handicap placards from previous PCP. They are not expired.  Spoke with Dr Army Melia and had to explain to patient several times that Dr Army Melia does not sign for handicap plates, only handicap placards and since she did not give him the initial placard, that does not mean she will give him one in the future. This will need to be discussed in the future when patients handicap placard expires. Patient said he needs the plate for bilateral neuropathy and chronic back issues. Patient said that he was very disappointed and he does not understand why he cannot get the plate. Informed patient that every primary care doctor is different so just because one pcp gave him the placard does not mean another one will. He again continued to state that he does not understand why he cannot get the plate, and that is disappointed. He wanted to talk to Dr Army Melia but I informed patient that she can hear the conversation and she is next to me during this conversation. Dr Army Melia verbally agreed to everything I informed patient.

## 2022-08-05 NOTE — Telephone Encounter (Signed)
Error.     KP 

## 2022-08-05 NOTE — Telephone Encounter (Signed)
Spoke to pt he stated that he understood more why we don't do tags and we do placards. Pt stated he wrong a mychart message to Dr. Army Melia explaining everything.  KP

## 2022-08-12 DIAGNOSIS — M5451 Vertebrogenic low back pain: Secondary | ICD-10-CM | POA: Diagnosis not present

## 2022-08-12 DIAGNOSIS — M545 Low back pain, unspecified: Secondary | ICD-10-CM | POA: Diagnosis not present

## 2022-08-12 DIAGNOSIS — Z5181 Encounter for therapeutic drug level monitoring: Secondary | ICD-10-CM | POA: Diagnosis not present

## 2022-08-12 DIAGNOSIS — M5416 Radiculopathy, lumbar region: Secondary | ICD-10-CM | POA: Diagnosis not present

## 2022-08-12 DIAGNOSIS — F4310 Post-traumatic stress disorder, unspecified: Secondary | ICD-10-CM | POA: Diagnosis not present

## 2022-08-12 DIAGNOSIS — F3132 Bipolar disorder, current episode depressed, moderate: Secondary | ICD-10-CM | POA: Diagnosis not present

## 2022-08-20 DIAGNOSIS — Z79899 Other long term (current) drug therapy: Secondary | ICD-10-CM | POA: Diagnosis not present

## 2022-08-20 DIAGNOSIS — Z5181 Encounter for therapeutic drug level monitoring: Secondary | ICD-10-CM | POA: Diagnosis not present

## 2022-08-22 DIAGNOSIS — E785 Hyperlipidemia, unspecified: Secondary | ICD-10-CM | POA: Diagnosis not present

## 2022-08-22 DIAGNOSIS — Z125 Encounter for screening for malignant neoplasm of prostate: Secondary | ICD-10-CM | POA: Diagnosis not present

## 2022-08-22 DIAGNOSIS — Z0000 Encounter for general adult medical examination without abnormal findings: Secondary | ICD-10-CM | POA: Diagnosis not present

## 2022-08-22 DIAGNOSIS — D696 Thrombocytopenia, unspecified: Secondary | ICD-10-CM | POA: Diagnosis not present

## 2022-08-22 DIAGNOSIS — I10 Essential (primary) hypertension: Secondary | ICD-10-CM | POA: Diagnosis not present

## 2022-08-22 DIAGNOSIS — K219 Gastro-esophageal reflux disease without esophagitis: Secondary | ICD-10-CM | POA: Diagnosis not present

## 2022-08-23 LAB — LIPID PANEL
Chol/HDL Ratio: 3.1 ratio (ref 0.0–5.0)
Cholesterol, Total: 141 mg/dL (ref 100–199)
HDL: 45 mg/dL (ref >39)
LDL Chol Calc (NIH): 71 mg/dL (ref 0–99)
Triglycerides: 144 mg/dL (ref 0–149)
VLDL Cholesterol Cal: 25 mg/dL (ref 5–40)

## 2022-08-23 LAB — VITAMIN B12: Vitamin B-12: 304 pg/mL (ref 232–1245)

## 2022-08-23 LAB — CBC WITH DIFFERENTIAL/PLATELET
Basophils Absolute: 0 10*3/uL (ref 0.0–0.2)
Basos: 1 %
EOS (ABSOLUTE): 0.2 10*3/uL (ref 0.0–0.4)
Eos: 5 %
Hematocrit: 44.2 % (ref 37.5–51.0)
Hemoglobin: 13.5 g/dL (ref 13.0–17.7)
Immature Grans (Abs): 0 10*3/uL (ref 0.0–0.1)
Immature Granulocytes: 0 %
Lymphocytes Absolute: 1.5 10*3/uL (ref 0.7–3.1)
Lymphs: 34 %
MCH: 27.2 pg (ref 26.6–33.0)
MCHC: 30.5 g/dL — ABNORMAL LOW (ref 31.5–35.7)
MCV: 89 fL (ref 79–97)
Monocytes Absolute: 0.4 10*3/uL (ref 0.1–0.9)
Monocytes: 10 %
Neutrophils Absolute: 2.2 10*3/uL (ref 1.4–7.0)
Neutrophils: 50 %
Platelets: 135 10*3/uL — ABNORMAL LOW (ref 150–450)
RBC: 4.97 x10E6/uL (ref 4.14–5.80)
RDW: 13.5 % (ref 11.6–15.4)
WBC: 4.3 10*3/uL (ref 3.4–10.8)

## 2022-08-23 LAB — COMPREHENSIVE METABOLIC PANEL
ALT: 18 IU/L (ref 0–44)
AST: 26 IU/L (ref 0–40)
Albumin/Globulin Ratio: 1.7 (ref 1.2–2.2)
Albumin: 4.5 g/dL (ref 3.8–4.9)
Alkaline Phosphatase: 140 IU/L — ABNORMAL HIGH (ref 44–121)
BUN/Creatinine Ratio: 8 — ABNORMAL LOW (ref 10–24)
BUN: 10 mg/dL (ref 8–27)
Bilirubin Total: 0.2 mg/dL (ref 0.0–1.2)
CO2: 23 mmol/L (ref 20–29)
Calcium: 9.4 mg/dL (ref 8.6–10.2)
Chloride: 107 mmol/L — ABNORMAL HIGH (ref 96–106)
Creatinine, Ser: 1.25 mg/dL (ref 0.76–1.27)
Globulin, Total: 2.7 g/dL (ref 1.5–4.5)
Glucose: 118 mg/dL — ABNORMAL HIGH (ref 70–99)
Potassium: 5.1 mmol/L (ref 3.5–5.2)
Sodium: 145 mmol/L — ABNORMAL HIGH (ref 134–144)
Total Protein: 7.2 g/dL (ref 6.0–8.5)
eGFR: 66 mL/min/{1.73_m2} (ref >59)

## 2022-08-23 LAB — TSH: TSH: 1.86 u[IU]/mL (ref 0.450–4.500)

## 2022-08-23 LAB — PSA: Prostate Specific Ag, Serum: 1.4 ng/mL (ref 0.0–4.0)

## 2022-08-26 DIAGNOSIS — M950 Acquired deformity of nose: Secondary | ICD-10-CM | POA: Diagnosis not present

## 2022-09-16 DIAGNOSIS — H25811 Combined forms of age-related cataract, right eye: Secondary | ICD-10-CM | POA: Diagnosis not present

## 2022-09-23 DIAGNOSIS — M65312 Trigger thumb, left thumb: Secondary | ICD-10-CM | POA: Diagnosis not present

## 2022-09-24 DIAGNOSIS — F4310 Post-traumatic stress disorder, unspecified: Secondary | ICD-10-CM | POA: Diagnosis not present

## 2022-09-24 DIAGNOSIS — F3132 Bipolar disorder, current episode depressed, moderate: Secondary | ICD-10-CM | POA: Diagnosis not present

## 2022-10-02 DIAGNOSIS — H269 Unspecified cataract: Secondary | ICD-10-CM | POA: Diagnosis not present

## 2022-10-02 DIAGNOSIS — H25811 Combined forms of age-related cataract, right eye: Secondary | ICD-10-CM | POA: Diagnosis not present

## 2022-10-16 DIAGNOSIS — H25812 Combined forms of age-related cataract, left eye: Secondary | ICD-10-CM | POA: Diagnosis not present

## 2022-10-16 DIAGNOSIS — H269 Unspecified cataract: Secondary | ICD-10-CM | POA: Diagnosis not present

## 2022-10-30 DIAGNOSIS — M65312 Trigger thumb, left thumb: Secondary | ICD-10-CM | POA: Diagnosis not present

## 2022-10-30 DIAGNOSIS — M720 Palmar fascial fibromatosis [Dupuytren]: Secondary | ICD-10-CM | POA: Diagnosis not present

## 2022-11-06 DIAGNOSIS — Z885 Allergy status to narcotic agent status: Secondary | ICD-10-CM | POA: Diagnosis not present

## 2022-11-06 DIAGNOSIS — Z888 Allergy status to other drugs, medicaments and biological substances status: Secondary | ICD-10-CM | POA: Diagnosis not present

## 2022-11-06 DIAGNOSIS — I712 Thoracic aortic aneurysm, without rupture, unspecified: Secondary | ICD-10-CM | POA: Diagnosis not present

## 2022-11-06 DIAGNOSIS — I7121 Aneurysm of the ascending aorta, without rupture: Secondary | ICD-10-CM | POA: Diagnosis not present

## 2022-11-06 DIAGNOSIS — Z886 Allergy status to analgesic agent status: Secondary | ICD-10-CM | POA: Diagnosis not present

## 2022-11-12 DIAGNOSIS — M542 Cervicalgia: Secondary | ICD-10-CM | POA: Diagnosis not present

## 2022-11-14 DIAGNOSIS — Z9889 Other specified postprocedural states: Secondary | ICD-10-CM | POA: Diagnosis not present

## 2022-11-14 DIAGNOSIS — D696 Thrombocytopenia, unspecified: Secondary | ICD-10-CM | POA: Diagnosis not present

## 2022-11-14 DIAGNOSIS — J3489 Other specified disorders of nose and nasal sinuses: Secondary | ICD-10-CM | POA: Diagnosis not present

## 2022-11-14 DIAGNOSIS — I2510 Atherosclerotic heart disease of native coronary artery without angina pectoris: Secondary | ICD-10-CM | POA: Diagnosis not present

## 2022-11-14 DIAGNOSIS — M950 Acquired deformity of nose: Secondary | ICD-10-CM | POA: Diagnosis not present

## 2022-11-19 DIAGNOSIS — F3132 Bipolar disorder, current episode depressed, moderate: Secondary | ICD-10-CM | POA: Diagnosis not present

## 2022-11-19 DIAGNOSIS — F4310 Post-traumatic stress disorder, unspecified: Secondary | ICD-10-CM | POA: Diagnosis not present

## 2022-11-20 DIAGNOSIS — M950 Acquired deformity of nose: Secondary | ICD-10-CM | POA: Diagnosis not present

## 2022-11-20 DIAGNOSIS — J3489 Other specified disorders of nose and nasal sinuses: Secondary | ICD-10-CM | POA: Diagnosis not present

## 2022-11-21 DIAGNOSIS — H524 Presbyopia: Secondary | ICD-10-CM | POA: Diagnosis not present

## 2022-11-21 DIAGNOSIS — Z961 Presence of intraocular lens: Secondary | ICD-10-CM | POA: Diagnosis not present

## 2022-12-08 ENCOUNTER — Telehealth: Payer: Self-pay | Admitting: Internal Medicine

## 2022-12-08 NOTE — Telephone Encounter (Signed)
Copied from [place] [id number]. Topic: Complaint - Billing/Coding >> Dec 08, 2022 10:24 AM Denman George T wrote: DOS: 07/17/2022 Details of complaint: Urinalysis for a chronic condition How would the patient like to see this issue resolved? Coded correctly for billing  Route to Engineer, building services.

## 2022-12-15 DIAGNOSIS — G4733 Obstructive sleep apnea (adult) (pediatric): Secondary | ICD-10-CM | POA: Diagnosis not present

## 2022-12-15 DIAGNOSIS — M5416 Radiculopathy, lumbar region: Secondary | ICD-10-CM | POA: Diagnosis not present

## 2022-12-15 DIAGNOSIS — M4326 Fusion of spine, lumbar region: Secondary | ICD-10-CM | POA: Diagnosis not present

## 2022-12-19 NOTE — Telephone Encounter (Addendum)
Pt wife is calling back regarding the bill for Urinalysis that she received. Provided the number to the billing dept and connected pt wife to the billing dept.

## 2023-02-02 ENCOUNTER — Ambulatory Visit
Admission: EM | Admit: 2023-02-02 | Discharge: 2023-02-02 | Disposition: A | Discharge status: Home / Self Care | Payer: Federal, State, Local not specified - PPO | Attending: Urgent Care | Admitting: Urgent Care

## 2023-02-02 ENCOUNTER — Encounter: Payer: Self-pay | Admitting: Emergency Medicine

## 2023-02-02 DIAGNOSIS — J069 Acute upper respiratory infection, unspecified: Secondary | ICD-10-CM | POA: Diagnosis not present

## 2023-02-02 DIAGNOSIS — R051 Acute cough: Secondary | ICD-10-CM | POA: Diagnosis not present

## 2023-02-02 MED ORDER — PROMETHAZINE-DM 6.25-15 MG/5ML PO SYRP: 5.0000 mL | ORAL_SOLUTION | Freq: Every evening | ORAL | 0 refills | Status: DC | PRN

## 2023-02-02 MED ORDER — BENZONATATE 100 MG PO CAPS: ORAL_CAPSULE | ORAL | 0 refills | Status: DC

## 2023-02-02 NOTE — ED Triage Notes (Signed)
Pt reports a cough, fatigue and nasal congestion x 1 week. States his cough was productive with yellow/green phlegm and now has been unable to cough anything up. States felt like he had a gurgling feeling in lungs. Has been taking Nyquil Cold and Flu.

## 2023-02-02 NOTE — Discharge Instructions (Addendum)
Follow up here or with your primary care provider if your symptoms are worsening or not improving.     

## 2023-02-02 NOTE — ED Provider Notes (Addendum)
Hector [name]    CSN: [id number] Arrival date & time: 02/02/23  0802      History   Chief Complaint Chief Complaint  Patient presents with   Cough   Nasal Congestion    HPI Hector [name] is a 62 y.o. male.    Cough   Patient presents to urgent care with complaint of symptoms x 1 week.  Symptoms include cough, fatigue, nasal congestion.  He reports productive cough with yellow/green sputum previously.  Now reports "unable" to cough anything up.  Patient states he feels a "gurgling" feeling in his lungs.  Using OTC medication.  No relevant medical history.  Patient states his wife is a Engineer, civil (consulting) and hears "crackles" in his chest and they have concerned about possible pneumonia.  He states he feels very fatigued and cough keeps him awake at night.  He denies significant nasal congestion or discharge.  Past Medical History:  Diagnosis Date   Abnormal LFTs    improved after gastic bypass   ADJ DISORDER WITH MIXED ANXIETY & DEPRESSED MOOD 08/23/2009   Qualifier: Diagnosis of  By: Fabian Sharp MD, Neta Mends Now on lamictal and lexapro and elevil for sleep.    Anxiety    Ascending aortic aneurysm (HCC)    a. 06/2018 CT: 4.1cm.   B12 nutritional deficiency    Bipolar disorder (HCC)    Carpal tunnel syndrome 04/06/2007   Qualifier: Diagnosis of  By: Lawernce Ion, CMA (AAMA), Carollee Herter S    Chest pain    a. 2007 MV: No ischemia; b. 09/2018 recurrent c/p in setting of PE; c. 09/2018 MV: No ischemia/infarct. EF 55-65%.   Closed head injury    laceration  9 12  no loc  vision change   Complete rotator cuff tear 04/09/2022   DDD (degenerative disc disease)    Depression    Diabetes mellitus    resolved with gastric bypass   GERD (gastroesophageal reflux disease)    no meds   Gout    Headache    once per week uses Indomethicin hx of cluster headaches    History of bunionectomy of left great toe    History of concussion    History of echocardiogram    a. 09/2018 Echo: Ef 65-70%, no  rwma, mild MR. Ao root 41mm (mildly dil).   History of kidney stones    History of renal stone    Seen in emergency room not urologist   Hyperlipidemia    Hypertension 02/27/2017   Neuromuscular disorder (HCC)    Nonspecific pain in the lumbar region    OBESITY 12/10/2007   a. s/p gastric bypass.   OSA (obstructive sleep apnea) 12/12/2013   resolved by weight loss surgery 10/26/2009   Osteoarthritis of knee    Pain in left knee    Pancreatitis 03/21/2016   went to [place]   Perforated ulcer [facility])    Surgical repair9/24 2011   Peripheral neuropathy    PONV (postoperative nausea and vomiting)    PTSD (post-traumatic stress disorder)    Pulmonary embolism (HCC)    a. 09/18/2018 CTA Chest: RLL segmental arterial branch small volume PE-->Eliquis.   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   Dysautonomia orthostatic hypotension syndrome 08/01/2021   Ataxia on examination 08/01/2021   Postural dizziness with presyncope 08/01/2021   Anemia  due to zinc deficiency 12/02/2020   Anemia due to acquired thiamine deficiency 12/02/2020   Elevated LFTs 11/26/2020   Dietary folate deficiency anemia 11/26/2020   Ascending aortic aneurysm (HCC)    Osteoarthritis of knee 11/02/2018   History of pulmonary embolus (PE) 09/24/2018   Primary hypertension 02/27/2017   Perennial allergic rhinitis 11/26/2015   Renal calculi    Lumbar back pain with radiculopathy affecting left lower extremity 05/15/2014   OSA (obstructive sleep apnea) 12/12/2013   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   GERD (gastroesophageal reflux disease) 12/28/2012   Thrombocytopenia (HCC) 05/24/2010   Gout, arthritis 08/02/2007   B12 deficiency 04/06/2007   Mild hyperlipidemia 04/06/2007    Past Surgical History:  Procedure Laterality Date    ABDOMINAL EXPOSURE N/A 03/29/2020   Procedure: ABDOMINAL EXPOSURE;  Surgeon: Larina Earthly, MD;  Location: [facility] OR;  Service: Vascular;  Laterality: N/A;   ANTERIOR LUMBAR FUSION N/A 03/29/2020   Procedure: ANTERIOR LUMBAR FUSION (ALIF) L4-5;  Surgeon: Venita Lick, MD;  Location: [facility] OR;  Service: Orthopedics;  Laterality: N/A;  5.5 hrs Dr. Arbie Cookey do approach Lt tap block with exparel   BACK SURGERY  2012   Dr. Delorse Limber disk   CARPAL TUNNEL RELEASE  2008   left-ulnar nerve decomp   carpel tunnel Left    CHOLECYSTECTOMY N/A 03/21/2016   Procedure: LAPAROSCOPIC CHOLECYSTECTOMY WITH INTRAOPERATIVE CHOLANGIOGRAM;  Surgeon: Axel Filler, MD;  Location: [facility] ORS;  Service: General;  Laterality: N/A;   colonscopy      DEBRIDEMENT AND CLOSURE WOUND N/A 05/09/2020   Procedure: Irrigation and debridement of superficial wound closure;  Surgeon: Venita Lick, MD;  Location: [facility] OR;  Service: Orthopedics;  Laterality: N/A;  1 hr   DIAGNOSTIC LAPAROSCOPY     laparoscopic gastric bypass   EPIDURAL BLOCK INJECTION     by Dr. Ethelene Hal   FOOT SURGERY     bilat for removal of extra bone    GASTRIC BYPASS  11/06/09   beginning weight 267   HARDWARE REMOVAL N/A 05/02/2016   Procedure: Removal of hardware Lumbar five-Sacral one with Metrex;  Surgeon: Barnett Abu, MD;  Location: [facility];  Service: Neurosurgery;  Laterality: N/A;  Removal of hardware L5-S1 with Metrex   HERNIA REPAIR  1998   umbilical   HIATAL HERNIA REPAIR N/A 01/20/2013   Procedure: LAPAROSCOPIC REPAIR OF INTERNAL HERNIA;  Surgeon: Kandis Cocking, MD;  Location: [facility];  Service: General;  Laterality: N/A;   LUMBAR LAMINECTOMY/DECOMPRESSION MICRODISCECTOMY Left 06/07/2014   Procedure: MICRO LUMBAR DECOMPRESSION L5 - S1 ON THE LEFT/L5 FORAMINOTOMY/EXCISION SYNOVIAL CYST  1 LEVEL;  Surgeon: Javier Docker, MD;  Location: [facility];  Service: Orthopedics;  Laterality: Left;   MASS EXCISION Right 04/18/2013   Procedure: RIGHT SMALL FINGER SKIN  LESION EXCISION;  Surgeon: Jodi Marble, MD;  Location: [facility];  Service: Orthopedics;  Laterality: Right;   NASAL SEPTOPLASTY W/ TURBINOPLASTY     x 2   NEPHROLITHOTOMY Right 11/03/2014   Procedure: RIGHT PERCUTANEOUS NEPHROLITHOTOMY;  Surgeon: Garnett Farm, MD;  Location: [facility];  Service: Urology;  Laterality: Right;   SHOULDER ARTHROSCOPY  2014   rt   SHOULDER ARTHROSCOPY WITH ROTATOR CUFF REPAIR AND SUBACROMIAL DECOMPRESSION Left 04/09/2022   Procedure: LEFT SHOULDER ARTHROSCOPY WITH MINI OPEN ROTATOR CUFF REPAIR, SUBACROMIAL DECOMPRESSION, POSSIBLE DISTAL CLAVICLE RESECTION AND BONE GRAFTING HUMERAL HEAD WITH ALLOGRAFT AND AUTOLOGOUS BONE;  Surgeon: Jene Every, MD;  Location: [facility];  Service: Orthopedics;  Laterality: Left;  2 hrs   surgerical repair of stomach ulcer  06/08/10   per   TONSILLECTOMY     UVULOPALATOPHARYNGOPLASTY  2007   with tonsils   VASECTOMY         Home Medications    Prior to Admission medications   Medication Sig Start Date End Date Taking? Authorizing Provider  acetaminophen (TYLENOL) 500 MG tablet Take 1,000 mg by mouth every 6 (six) hours as needed for mild pain.    [provider]  albuterol (PROVENTIL HFA;VENTOLIN HFA) 108 (90 Base) MCG/ACT inhaler Inhale 2 puffs into the lungs every 6 (six) hours as needed for wheezing or shortness of breath. 09/03/18   Wynn Banker, MD  allopurinol (ZYLOPRIM) 100 MG tablet Take 1 tablet (100 mg total) by mouth daily. 07/17/22   Reubin Milan, MD  apixaban (ELIQUIS) 2.5 MG TABS tablet Take 1 tablet (2.5 mg total) by mouth 2 (two) times daily. Patient not taking: Reported on 07/17/2022 04/10/22   Jene Every, MD  celecoxib (CELEBREX) 200 MG capsule Take 200 mg by mouth daily as needed for mild pain or moderate pain. 05/26/21   [provider]  colchicine 0.6 MG tablet TAKE ONE TABLET BY MOUTH DAILY AS NEEDED FOR GOUT 08/07/20   Candelaria Stagers, DPM  docusate sodium  (COLACE) 100 MG capsule Take 1 capsule (100 mg total) by mouth 2 (two) times daily as needed for mild constipation. 04/10/22   Jene Every, MD  escitalopram (LEXAPRO) 20 MG tablet Take 20 mg by mouth every morning.     [provider]  Fish Oil-Krill Oil (KRILL & FISH OIL BLEND PO) Take 1 capsule by mouth daily.    [provider]  folic acid (FOLVITE) 1 MG tablet Take 1 tablet (1 mg total) by mouth daily. 11/26/20   Etta Grandchild, MD  HYDROmorphone (DILAUDID) 2 MG tablet take 1-2 tabs every 4-6 hrs as needed for pain 04/23/22   [provider]  HYDROmorphone (DILAUDID) 4 MG tablet Take 1 tablet every 4-6 hours by oral route as needed for 7 days. 05/02/22   [provider]  lamoTRIgine (LAMICTAL) 100 MG tablet Take 150 mg by mouth 2 (two) times daily.     [provider]  losartan (COZAAR) 25 MG tablet Take 1 tablet (25 mg total) by mouth daily. Take if SBP >130 mmHg in am and take additional dose at night if SBP >130 mmHg Patient taking differently: Take 25 mg by mouth as needed. Take if SBP >130 mmHg in am and take additional dose at night if SBP >130 mmHg 04/03/21 07/17/22  Gillis Santa, MD  methocarbamol (ROBAXIN) 500 MG tablet Take 1 tablet (500 mg total) by mouth daily as needed for muscle spasms. 07/17/22   Reubin Milan, MD  Multiple Vitamin (MULTIVITAMIN) capsule Take 1 capsule by mouth daily.    [provider]  nitroGLYCERIN (NITROSTAT) 0.3 MG SL tablet Place 1 tablet (0.3 mg total) under the tongue every 5 (five) minutes as needed for chest pain. 09/27/18 07/17/22  Katha Hamming, MD  oxyCODONE-acetaminophen (PERCOCET) 10-325 MG tablet Take 1 tablet by mouth 4 (four) times daily as needed for pain.    [provider]  polyethylene glycol (MIRALAX / GLYCOLAX) 17 g packet Take 17 g by mouth daily. 04/10/22   Jene Every, MD  pregabalin (LYRICA) 150 MG capsule Take 150 mg by mouth 3 (three) times daily. 03/19/21   [provider]  Spacer/Aero-Holding  Chambers (E-Z SPACER) inhaler Use as instructed 09/03/18   Wynn Banker, MD  tamsulosin (FLOMAX) 0.4 MG CAPS capsule Take 0.4 mg by mouth daily as needed (kidney stones).     [provider]  traZODone (DESYREL) 100 MG tablet Take 300 mg by mouth at bedtime as needed for sleep. 07/17/19   [provider]  vitamin E 180 MG (400 UNITS) capsule Take 400 Units by mouth daily.    [provider]  zinc gluconate 50 MG tablet Take 1 tablet (50 mg total) by mouth daily. 05/10/21   Etta Grandchild, MD  enoxaparin (LOVENOX) 40 MG/0.4ML injection Inject 0.4 mLs (40 mg total) into the skin daily. 10 day supply 1 injection per day Patient not taking: No sig reported 03/29/20 10/17/20  Venita Lick, MD    Family History Family History  Problem Relation Age of Onset   Uterine cancer Mother    Hypertension Mother    Cervical cancer Mother    Heart disease Father    COPD Father    Seizures Father    Arthritis Father    Arthritis Sister    Diabetes Sister    Colon cancer Maternal Uncle    Colon cancer Paternal Aunt    Diabetes Paternal Aunt    Heart disease Maternal Grandmother    Heart disease Maternal Grandfather    Heart disease Paternal Grandmother    Heart disease Paternal Grandfather    Diabetes Paternal Uncle     Social History Social History   Tobacco Use   Smoking status: Never   Smokeless tobacco: Never  Vaping Use   Vaping Use: Never used  Substance Use Topics   Alcohol use: Yes    Comment: occas liquor    Drug use: No     Allergies   Aspirin, Cymbalta [duloxetine hcl], Nsaids, Tape, Hydrocodone, and Vancomycin   Review of Systems Review of Systems  Respiratory:  Positive for cough.      Physical Exam Triage Vital Signs ED Triage Vitals [02/02/23 0816]  Enc Vitals Group     BP (!) 150/94     Pulse Rate 73     Resp 18     Temp 98 F (36.7 C)     Temp Source Oral     SpO2 96 %     Weight       Height      Head Circumference      Peak Flow      Pain Score 0     Pain Loc      Pain Edu?      Excl. in GC?    No data found.  Updated Vital Signs BP (!) 150/94 (BP Location: Left Arm)   Pulse 73   Temp 98 F (36.7 C) (Oral)   Resp 18   SpO2 96%   Visual Acuity Right Eye Distance:   Left Eye Distance:   Bilateral Distance:    Right Eye Near:   Left Eye Near:    Bilateral Near:     Physical Exam Vitals reviewed.  Constitutional:      Appearance: Normal appearance.  HENT:     Mouth/Throat:     Mouth: Mucous membranes are moist.  Eyes:     Conjunctiva/sclera: Conjunctivae normal.     Pupils: Pupils are equal, round, and reactive to light.  Cardiovascular:     Rate and Rhythm: Normal rate and regular rhythm.     Pulses: Normal pulses.  Heart sounds: Normal heart sounds.  Pulmonary:     Effort: Pulmonary effort is normal.     Breath sounds: Normal breath sounds.  Skin:    General: Skin is warm and dry.  Neurological:     General: No focal deficit present.     Mental Status: He is alert and oriented to person, place, and time.  Psychiatric:        Mood and Affect: Mood normal.        Behavior: Behavior normal.      UC Treatments / Results  Labs (all labs ordered are listed, but only abnormal results are displayed) Labs Reviewed - No data to display  EKG   Radiology No results found.  Procedures Procedures (including critical care time)  Medications Ordered in UC Medications - No data to display  Initial Impression / Assessment and Plan / UC Course  I have reviewed the triage vital signs and the nursing notes.  Pertinent labs & imaging results that were available during my care of the patient were reviewed by me and considered in my medical decision making (see chart for details).   DRACE GATER is a 62 y.o. male presenting with cough. Patient is afebrile without recent antipyretics, satting well on room air. Overall is well appearing  though non-toxic, well hydrated, without respiratory distress. Pulmonary exam is unremarkable.  Lungs CTAB without wheezing, rhonchi, rales. RRR.  Wet sounding cough, though ineffective, is witnessed by provider.  Given duration, patient's symptoms are most consistent with a acute viral process.  No wheezing or crackles is auscultated.  He is not at any increased risk for poor outcome.    He likely has increased sputum production in his bronchial tree and recommending use of guaifenesin.  Encourage patient to drink plenty of water to ensure adequate hydration.  Also educated him on cough and deep breathe as well as half breathing to encourage expectoration of excess sputum.  Will prescribe benzonatate and promethazine-DM (chart shows intolerance of hydrocodone) to help with his cough.  Reviewed chart history.   Counseled patient on potential for adverse effects with medications prescribed/recommended today, ER and return-to-clinic precautions discussed, patient verbalized understanding and agreement with care plan.   Final Clinical Impressions(s) / UC Diagnoses   Final diagnoses:  None   Discharge Instructions   None    ED Prescriptions   None    PDMP not reviewed this encounter.   Charma Igo, FNP 02/02/23 0832    Charma Igo, FNP 02/02/23 [phone]

## 2023-02-08 ENCOUNTER — Ambulatory Visit (INDEPENDENT_AMBULATORY_CARE_PROVIDER_SITE_OTHER): Payer: Federal, State, Local not specified - PPO

## 2023-02-08 ENCOUNTER — Ambulatory Visit
Admission: EM | Admit: 2023-02-08 | Discharge: 2023-02-08 | Disposition: A | Discharge status: Home / Self Care | Payer: Federal, State, Local not specified - PPO | Attending: Urgent Care | Admitting: Urgent Care

## 2023-02-08 DIAGNOSIS — J9811 Atelectasis: Secondary | ICD-10-CM

## 2023-02-08 DIAGNOSIS — R059 Cough, unspecified: Secondary | ICD-10-CM | POA: Diagnosis not present

## 2023-02-08 DIAGNOSIS — R051 Acute cough: Secondary | ICD-10-CM | POA: Diagnosis not present

## 2023-02-08 DIAGNOSIS — R062 Wheezing: Secondary | ICD-10-CM | POA: Diagnosis not present

## 2023-02-08 DIAGNOSIS — J069 Acute upper respiratory infection, unspecified: Secondary | ICD-10-CM

## 2023-02-08 DIAGNOSIS — R0602 Shortness of breath: Secondary | ICD-10-CM | POA: Diagnosis not present

## 2023-02-08 MED ORDER — PROMETHAZINE-DM 6.25-15 MG/5ML PO SYRP: 5.0000 mL | ORAL_SOLUTION | Freq: Every evening | ORAL | 0 refills | Status: DC | PRN

## 2023-02-08 MED ORDER — AZITHROMYCIN 250 MG PO TABS: 250.0000 mg | ORAL_TABLET | Freq: Every day | ORAL | 0 refills | Status: DC

## 2023-02-08 MED ORDER — BENZONATATE 100 MG PO CAPS: ORAL_CAPSULE | ORAL | 0 refills | Status: DC

## 2023-02-08 NOTE — ED Triage Notes (Signed)
Pt c/o chest congestion, cough, and fatigue that has been going on for about 2 weeks.   Home interventions: benzonatate

## 2023-02-08 NOTE — ED Provider Notes (Signed)
Hector [name]    CSN: [id number] Arrival date & time: 02/08/23  9147      History   Chief Complaint Chief Complaint  Patient presents with   Nasal Congestion   Cough    HPI Hector [name] is a 62 y.o. male.    Cough   Patient presents to urgent care with complaint of symptoms x 2 weeks.  He endorses chest congestion, cough, fatigue.  Presents with elevated temp of 100.3 F.  Patient was evaluated on 02/02/2023 (6 days ago) with similar symptoms, at that time x 1 week.  He presented with report of productive cough with yellow/green sputum prior to his evaluation and stated that he was "unable" to cough anything up presently but felt a "gurgling" feeling in his lungs.  He expressed concern for possible pneumonia and stated that his wife heard "crackles" in his chest.  His respiratory evaluation was unremarkable with no adventitious lung sounds heard.  Given short duration of his symptoms, he was treated for viral etiology with cough suppressants benzonatate and Promethazine DM.  He has no relevant medical history-no history of chronic respiratory illness.  Past Medical History:  Diagnosis Date   Abnormal LFTs    improved after gastic bypass   ADJ DISORDER WITH MIXED ANXIETY & DEPRESSED MOOD 08/23/2009   Qualifier: Diagnosis of  By: Fabian Sharp MD, Neta Mends Now on lamictal and lexapro and elevil for sleep.    Anxiety    Ascending aortic aneurysm (HCC)    a. 06/2018 CT: 4.1cm.   B12 nutritional deficiency    Bipolar disorder (HCC)    Carpal tunnel syndrome 04/06/2007   Qualifier: Diagnosis of  By: Lawernce Ion, CMA (AAMA), Carollee Herter S    Chest pain    a. 2007 MV: No ischemia; b. 09/2018 recurrent c/p in setting of PE; c. 09/2018 MV: No ischemia/infarct. EF 55-65%.   Closed head injury    laceration  9 12  no loc  vision change   Complete rotator cuff tear 04/09/2022   DDD (degenerative disc disease)    Depression    Diabetes mellitus    resolved with gastric bypass   GERD  (gastroesophageal reflux disease)    no meds   Gout    Headache    once per week uses Indomethicin hx of cluster headaches    History of bunionectomy of left great toe    History of concussion    History of echocardiogram    a. 09/2018 Echo: Ef 65-70%, no rwma, mild MR. Ao root 41mm (mildly dil).   History of kidney stones    History of renal stone    Seen in emergency room not urologist   Hyperlipidemia    Hypertension 02/27/2017   Neuromuscular disorder (HCC)    Nonspecific pain in the lumbar region    OBESITY 12/10/2007   a. s/p gastric bypass.   OSA (obstructive sleep apnea) 12/12/2013   resolved by weight loss surgery 10/26/2009   Osteoarthritis of knee    Pain in left knee    Pancreatitis 03/21/2016   went to [facility]   Perforated ulcer [facility])    Surgical repair9/24 2011   Peripheral neuropathy    PONV (postoperative nausea and vomiting)    PTSD (post-traumatic stress disorder)    Pulmonary embolism (HCC)    a. 09/18/2018 CTA Chest: RLL segmental arterial branch small volume PE-->Eliquis.   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   Dysautonomia orthostatic hypotension syndrome 08/01/2021   Ataxia on examination 08/01/2021   Postural dizziness with presyncope 08/01/2021   Anemia due to zinc deficiency 12/02/2020   Anemia due to acquired thiamine deficiency 12/02/2020   Elevated LFTs 11/26/2020   Dietary folate deficiency anemia 11/26/2020   Ascending aortic aneurysm (HCC)    Osteoarthritis of knee 11/02/2018   History of pulmonary embolus (PE) 09/24/2018   Primary hypertension 02/27/2017   Perennial allergic rhinitis 11/26/2015   Renal calculi    Lumbar back pain with radiculopathy affecting left lower extremity 05/15/2014   OSA (obstructive sleep apnea) 12/12/2013   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 Arbie Cookey do approach Lt tap block with exparel   BACK SURGERY  2012   Dr. Delorse Limber disk   CARPAL TUNNEL RELEASE  2008   left-ulnar nerve decomp   carpel tunnel Left    CHOLECYSTECTOMY N/A 03/21/2016   Procedure: LAPAROSCOPIC CHOLECYSTECTOMY WITH INTRAOPERATIVE CHOLANGIOGRAM;  Surgeon: Axel Filler, MD;  Location: [facility] ORS;  Service: General;  Laterality: N/A;   colonscopy      DEBRIDEMENT AND CLOSURE WOUND N/A 05/09/2020   Procedure: Irrigation and debridement of superficial wound closure;  Surgeon: Venita Lick, MD;  Location: [facility] OR;  Service: Orthopedics;  Laterality: N/A;  1 hr   DIAGNOSTIC LAPAROSCOPY     laparoscopic gastric bypass   EPIDURAL BLOCK INJECTION     by Dr. Ethelene Hal   FOOT SURGERY     bilat for removal of extra bone    GASTRIC BYPASS  11/06/09   beginning weight 267   HARDWARE REMOVAL N/A 05/02/2016   Procedure: Removal of hardware Lumbar five-Sacral one with Metrex;  Surgeon: Barnett Abu, MD;  Location: [facility];  Service: Neurosurgery;  Laterality: N/A;  Removal of hardware L5-S1 with Metrex   HERNIA REPAIR  1998   umbilical   HIATAL HERNIA REPAIR N/A 01/20/2013   Procedure: LAPAROSCOPIC REPAIR OF INTERNAL HERNIA;  Surgeon: Kandis Cocking, MD;  Location: [facility];  Service: General;  Laterality: N/A;   LUMBAR LAMINECTOMY/DECOMPRESSION MICRODISCECTOMY Left 06/07/2014    Procedure: MICRO LUMBAR DECOMPRESSION L5 - S1 ON THE LEFT/L5 FORAMINOTOMY/EXCISION SYNOVIAL CYST  1 LEVEL;  Surgeon: Javier Docker, MD;  Location: [facility];  Service: Orthopedics;  Laterality: Left;   MASS EXCISION Right 04/18/2013   Procedure: RIGHT SMALL FINGER SKIN LESION EXCISION;  Surgeon: Jodi Marble, MD;  Location: [facility];  Service: Orthopedics;  Laterality: Right;   NASAL SEPTOPLASTY W/ TURBINOPLASTY     x 2   NEPHROLITHOTOMY Right 11/03/2014   Procedure: RIGHT PERCUTANEOUS NEPHROLITHOTOMY;  Surgeon: Garnett Farm, MD;  Location: [facility];  Service: Urology;  Laterality: Right;   SHOULDER ARTHROSCOPY  2014   rt   SHOULDER ARTHROSCOPY WITH ROTATOR CUFF REPAIR AND SUBACROMIAL DECOMPRESSION Left 04/09/2022  Procedure: LEFT SHOULDER ARTHROSCOPY WITH MINI OPEN ROTATOR CUFF REPAIR, SUBACROMIAL DECOMPRESSION, POSSIBLE DISTAL CLAVICLE RESECTION AND BONE GRAFTING HUMERAL HEAD WITH ALLOGRAFT AND AUTOLOGOUS BONE;  Surgeon: Jene Every, MD;  Location: [facility];  Service: Orthopedics;  Laterality: Left;  2 hrs   surgerical repair of stomach ulcer  06/08/10   per   TONSILLECTOMY     UVULOPALATOPHARYNGOPLASTY  2007   with tonsils   VASECTOMY         Home Medications    Prior to Admission medications   Medication Sig Start Date End Date Taking? Authorizing Provider  acetaminophen (TYLENOL) 500 MG tablet Take 1,000 mg by mouth every 6 (six) hours as needed for mild pain.    [provider]  albuterol (PROVENTIL HFA;VENTOLIN HFA) 108 (90 Base) MCG/ACT inhaler Inhale 2 puffs into the lungs every 6 (six) hours as needed for wheezing or shortness of breath. 09/03/18   Wynn Banker, MD  allopurinol (ZYLOPRIM) 100 MG tablet Take 1 tablet (100 mg total) by mouth daily. 07/17/22   Reubin Milan, MD  apixaban (ELIQUIS) 2.5 MG TABS tablet Take 1 tablet (2.5 mg total) by mouth 2 (two) times daily. Patient not taking: Reported on 07/17/2022 04/10/22   Jene Every, MD   benzonatate (TESSALON) 100 MG capsule Take 1-2 tablets 3 times a day as needed for cough 02/02/23   Laykin Rainone, Jeannett Senior, FNP  celecoxib (CELEBREX) 200 MG capsule Take 200 mg by mouth daily as needed for mild pain or moderate pain. 05/26/21   [provider]  colchicine 0.6 MG tablet TAKE ONE TABLET BY MOUTH DAILY AS NEEDED FOR GOUT 08/07/20   Candelaria Stagers, DPM  docusate sodium (COLACE) 100 MG capsule Take 1 capsule (100 mg total) by mouth 2 (two) times daily as needed for mild constipation. 04/10/22   Jene Every, MD  escitalopram (LEXAPRO) 20 MG tablet Take 20 mg by mouth every morning.     [provider]  Fish Oil-Krill Oil (KRILL & FISH OIL BLEND PO) Take 1 capsule by mouth daily.    [provider]  folic acid (FOLVITE) 1 MG tablet Take 1 tablet (1 mg total) by mouth daily. 11/26/20   Etta Grandchild, MD  HYDROmorphone (DILAUDID) 2 MG tablet take 1-2 tabs every 4-6 hrs as needed for pain 04/23/22   [provider]  HYDROmorphone (DILAUDID) 4 MG tablet Take 1 tablet every 4-6 hours by oral route as needed for 7 days. 05/02/22   [provider]  lamoTRIgine (LAMICTAL) 100 MG tablet Take 150 mg by mouth 2 (two) times daily.     [provider]  losartan (COZAAR) 25 MG tablet Take 1 tablet (25 mg total) by mouth daily. Take if SBP >130 mmHg in am and take additional dose at night if SBP >130 mmHg Patient taking differently: Take 25 mg by mouth as needed. Take if SBP >130 mmHg in am and take additional dose at night if SBP >130 mmHg 04/03/21 07/17/22  Gillis Santa, MD  methocarbamol (ROBAXIN) 500 MG tablet Take 1 tablet (500 mg total) by mouth daily as needed for muscle spasms. 07/17/22   Reubin Milan, MD  Multiple Vitamin (MULTIVITAMIN) capsule Take 1 capsule by mouth daily.    [provider]  nitroGLYCERIN (NITROSTAT) 0.3 MG SL tablet Place 1 tablet (0.3 mg total) under the tongue every 5 (five) minutes as needed for chest pain.  09/27/18 07/17/22  Katha Hamming, MD  oxyCODONE-acetaminophen (PERCOCET) 10-325 MG tablet Take  1 tablet by mouth 4 (four) times daily as needed for pain.    [provider]  polyethylene glycol (MIRALAX / GLYCOLAX) 17 g packet Take 17 g by mouth daily. 04/10/22   Jene Every, MD  pregabalin (LYRICA) 150 MG capsule Take 150 mg by mouth 3 (three) times daily. 03/19/21   [provider]  promethazine-dextromethorphan (PROMETHAZINE-DM) 6.25-15 MG/5ML syrup Take 5 mLs by mouth at bedtime as needed for cough. 02/02/23   Leocadio Heal, Jeannett Senior, FNP  Spacer/Aero-Holding Chambers (E-Z SPACER) inhaler Use as instructed 09/03/18   Wynn Banker, MD  tamsulosin (FLOMAX) 0.4 MG CAPS capsule Take 0.4 mg by mouth daily as needed (kidney stones).     [provider]  traZODone (DESYREL) 100 MG tablet Take 300 mg by mouth at bedtime as needed for sleep. 07/17/19   [provider]  vitamin E 180 MG (400 UNITS) capsule Take 400 Units by mouth daily.    [provider]  zinc gluconate 50 MG tablet Take 1 tablet (50 mg total) by mouth daily. 05/10/21   Etta Grandchild, MD  enoxaparin (LOVENOX) 40 MG/0.4ML injection Inject 0.4 mLs (40 mg total) into the skin daily. 10 day supply 1 injection per day Patient not taking: No sig reported 03/29/20 10/17/20  Venita Lick, MD    Family History Family History  Problem Relation Age of Onset   Uterine cancer Mother    Hypertension Mother    Cervical cancer Mother    Heart disease Father    COPD Father    Seizures Father    Arthritis Father    Arthritis Sister    Diabetes Sister    Colon cancer Maternal Uncle    Colon cancer Paternal Aunt    Diabetes Paternal Aunt    Heart disease Maternal Grandmother    Heart disease Maternal Grandfather    Heart disease Paternal Grandmother    Heart disease Paternal Grandfather    Diabetes Paternal Uncle     Social History Social History   Tobacco Use   Smoking status: Never    Smokeless tobacco: Never  Vaping Use   Vaping Use: Never used  Substance Use Topics   Alcohol use: Yes    Comment: occas liquor    Drug use: No     Allergies   Aspirin, Cymbalta [duloxetine hcl], Nsaids, Tape, Hydrocodone, and Vancomycin   Review of Systems Review of Systems  Respiratory:  Positive for cough.      Physical Exam Triage Vital Signs ED Triage Vitals  Enc Vitals Group     BP 02/08/23 0949 (!) 162/109     Pulse Rate 02/08/23 0949 97     Resp 02/08/23 0949 20     Temp 02/08/23 0949 100.3 F (37.9 C)     Temp Source 02/08/23 0949 Oral     SpO2 02/08/23 0949 95 %     Weight --      Height --      Head Circumference --      Peak Flow --      Pain Score 02/08/23 0948 5     Pain Loc --      Pain Edu? --      Excl. in GC? --    No data found.  Updated Vital Signs BP (!) 152/99 (BP Location: Left Arm)   Pulse 97   Temp 100.3 F (37.9 C) (Oral)   Resp 20   SpO2 91%   Visual Acuity Right Eye Distance:  Left Eye Distance:   Bilateral Distance:    Right Eye Near:   Left Eye Near:    Bilateral Near:     Physical Exam Vitals reviewed.  Constitutional:      Appearance: Normal appearance. He is ill-appearing.  Cardiovascular:     Rate and Rhythm: Normal rate and regular rhythm.     Pulses: Normal pulses.     Heart sounds: Normal heart sounds.  Pulmonary:     Effort: Pulmonary effort is normal.     Breath sounds: Normal breath sounds.  Skin:    General: Skin is warm and dry.  Neurological:     General: No focal deficit present.     Mental Status: He is alert and oriented to person, place, and time.  Psychiatric:        Mood and Affect: Mood normal.        Behavior: Behavior normal.      UC Treatments / Results  Labs (all labs ordered are listed, but only abnormal results are displayed) Labs Reviewed - No data to display  EKG   Radiology DG Chest 2 View  Result Date: 02/08/2023 CLINICAL DATA:  Acute productive cough for 2  weeks. Shortness of breath and wheezing. EXAM: CHEST - 2 VIEW COMPARISON:  Chest radiographs 04/01/2021 and 10/2020 FINDINGS: Cardiac silhouette and mediastinal contours are within normal limits. Mild left basilar horizontal linear subsegmental atelectasis versus scarring. No focal airspace opacity to indicate pneumonia. No pleural effusion or pneumothorax. Mild-to-moderate multilevel degenerative disc changes of the thoracic spine. Lower cervical spine discectomy prostheses seen on frontal view. IMPRESSION: Mild left basilar subsegmental atelectasis versus scarring. No acute lung process. Electronically Signed   By: Neita Garnet M.D.   On: 02/08/2023 10:17    Procedures Procedures (including critical care time)  Medications Ordered in UC Medications - No data to display  Initial Impression / Assessment and Plan / UC Course  I have reviewed the triage vital signs and the nursing notes.  Pertinent labs & imaging results that were available during my care of the patient were reviewed by me and considered in my medical decision making (see chart for details).   Hector [name] is a 62 y.o. male presenting with cough. Patient is afebrile without recent antipyretics, satting well on room air. Overall is somewhat ill appearing though non-toxic, well hydrated, without respiratory distress. Pulmonary exam is unremarkable.  Lungs continue to be CTAB without wheezing, rhonchi, rales. RRR.   Reviewed relevant chart history.   Because of his return visit and presence of elevated temperature in clinic, chest x-ray was ordered which indicates:  "Mild left basilar subsegmental atelectasis versus scarring. No acute lung process."  Counseled patient on significance of atelectasis, including that it is not an infection, but puts him at increased risk for infection.  Reiterated the information that we discussed at his previous visit regarding techniques for clearing mucus from his airways including cough and deep  breathe, huff breathing.  Also recommended use of incentive spirometer which she states he has at home.  Suggested using it 10 times an hour, holding the air in his lungs for 2 to 3 seconds, then "cough cough cough" or "huff" suddenly.   Symptoms are consistent with post viral cough syndrome accompanied by continued overproduction of sputum contributing to the atelectasis.  Will prescribe azithromycin given the increased risk of infection and risk for poor outcome given his cardiac history.  Counseled patient on potential for adverse effects with medications prescribed/recommended  today, ER and return-to-clinic precautions discussed, patient verbalized understanding and agreement with care plan.  Final Clinical Impressions(s) / UC Diagnoses   Final diagnoses:  Acute cough   Discharge Instructions   None    ED Prescriptions   None    PDMP not reviewed this encounter.   Charma Igo, [place] 02/08/23 1054

## 2023-02-08 NOTE — Discharge Instructions (Addendum)
Because you had a cough for 2 weeks, we performed a chest x-ray which showed atelectasis in the bases of your lungs.  Atelectasis is not infection, but puts you at risk for infection if not resolved.  Atelectasis is a collection of fluid in the spaces (alveoli) within your lungs where oxygen is exchanged for carbon dioxide.   We talked about strategies for helping your body to clear the atelectasis including: - cough and deep breathing.  I recommended using your incentive spirometer 10 times every hour.  This will give you a visual representation of how much air you are inhaling and show improvement as your lungs improve.  Breathing deeply will help "pop" open the alveoli and free up the mucus secretions that are trapped there.  I recommended holding the air in your lungs for 2 to 3 seconds if possible after inhalation, then immediately "cough, cough, cough."  - "Huff" breathing.  This is a "violent" and sudden exhalation of all the air in your lungs that will help expectorate the mucus.  - Daily and regular use of guaifenesin (Mucinex) and drinking water.  Guaifenesin is a medication that will help thin and loosen the mucus secretions from your airways.  I have prescribed azithromycin because you are at risk for infection developing, not because you have a current infection.  I have also represcribed benzonatate and Promethazine DM which you said were previously effective and suppressing frequent coughing.

## 2023-02-17 DIAGNOSIS — F3132 Bipolar disorder, current episode depressed, moderate: Secondary | ICD-10-CM | POA: Diagnosis not present

## 2023-02-17 DIAGNOSIS — F4310 Post-traumatic stress disorder, unspecified: Secondary | ICD-10-CM | POA: Diagnosis not present

## 2023-03-05 DIAGNOSIS — Z8709 Personal history of other diseases of the respiratory system: Secondary | ICD-10-CM | POA: Diagnosis not present

## 2023-03-05 DIAGNOSIS — Z8739 Personal history of other diseases of the musculoskeletal system and connective tissue: Secondary | ICD-10-CM | POA: Diagnosis not present

## 2023-03-05 DIAGNOSIS — Z09 Encounter for follow-up examination after completed treatment for conditions other than malignant neoplasm: Secondary | ICD-10-CM | POA: Diagnosis not present

## 2023-04-16 ENCOUNTER — Other Ambulatory Visit: Payer: Self-pay | Admitting: Internal Medicine

## 2023-04-16 DIAGNOSIS — M5416 Radiculopathy, lumbar region: Secondary | ICD-10-CM | POA: Diagnosis not present

## 2023-04-16 DIAGNOSIS — M4326 Fusion of spine, lumbar region: Secondary | ICD-10-CM | POA: Diagnosis not present

## 2023-04-16 DIAGNOSIS — M48062 Spinal stenosis, lumbar region with neurogenic claudication: Secondary | ICD-10-CM | POA: Diagnosis not present

## 2023-04-16 DIAGNOSIS — M1712 Unilateral primary osteoarthritis, left knee: Secondary | ICD-10-CM | POA: Diagnosis not present

## 2023-04-16 DIAGNOSIS — M109 Gout, unspecified: Secondary | ICD-10-CM

## 2023-04-22 DIAGNOSIS — H93293 Other abnormal auditory perceptions, bilateral: Secondary | ICD-10-CM | POA: Diagnosis not present

## 2023-04-22 DIAGNOSIS — R490 Dysphonia: Secondary | ICD-10-CM | POA: Diagnosis not present

## 2023-04-22 DIAGNOSIS — H9313 Tinnitus, bilateral: Secondary | ICD-10-CM | POA: Diagnosis not present

## 2023-05-14 DIAGNOSIS — F4310 Post-traumatic stress disorder, unspecified: Secondary | ICD-10-CM | POA: Diagnosis not present

## 2023-05-14 DIAGNOSIS — F3132 Bipolar disorder, current episode depressed, moderate: Secondary | ICD-10-CM | POA: Diagnosis not present

## 2023-05-22 DIAGNOSIS — M170 Bilateral primary osteoarthritis of knee: Secondary | ICD-10-CM | POA: Diagnosis not present

## 2023-07-05 DIAGNOSIS — S92345A Nondisplaced fracture of fourth metatarsal bone, left foot, initial encounter for closed fracture: Secondary | ICD-10-CM | POA: Diagnosis not present

## 2023-07-15 DIAGNOSIS — S92345A Nondisplaced fracture of fourth metatarsal bone, left foot, initial encounter for closed fracture: Secondary | ICD-10-CM | POA: Diagnosis not present

## 2023-07-27 IMAGING — DX DG SHOULDER 2+V*L*
2 series · 2 of 2 positions shown · non-contrast
Comparison: None.

CLINICAL DATA: Trauma, pain

EXAM:
LEFT SHOULDER - 2+ VIEW

[shoulder axial]
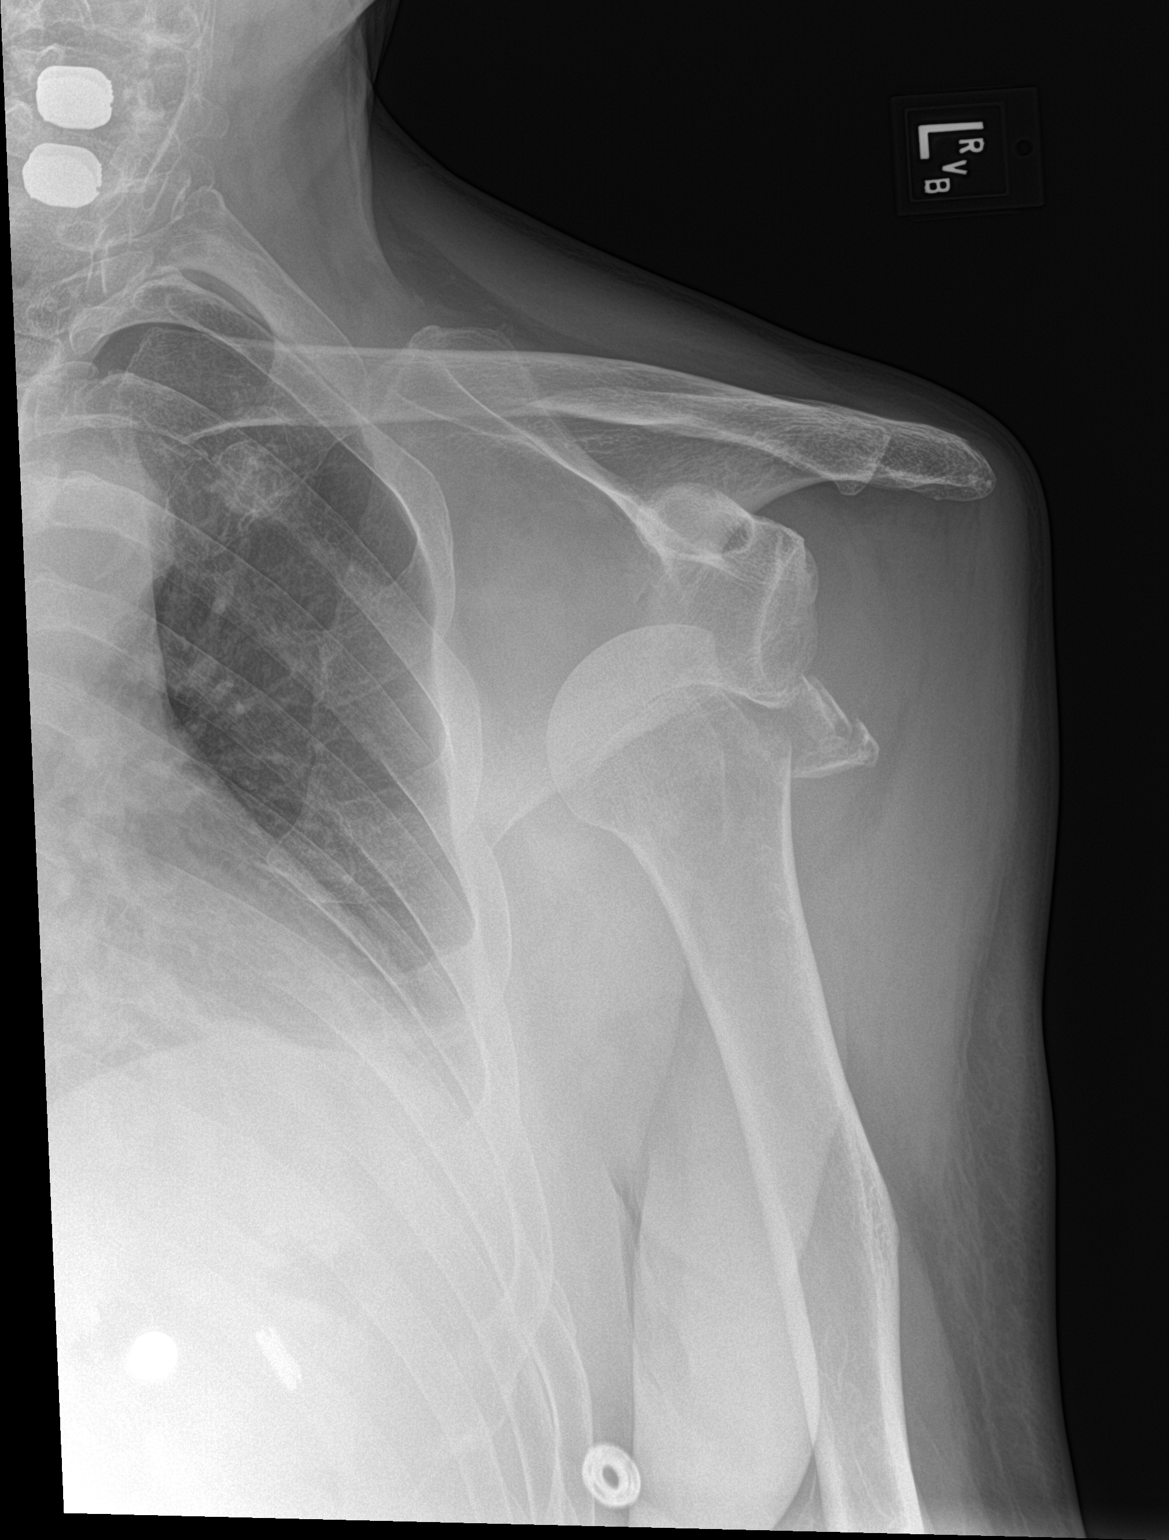

[shoulder swimmer]
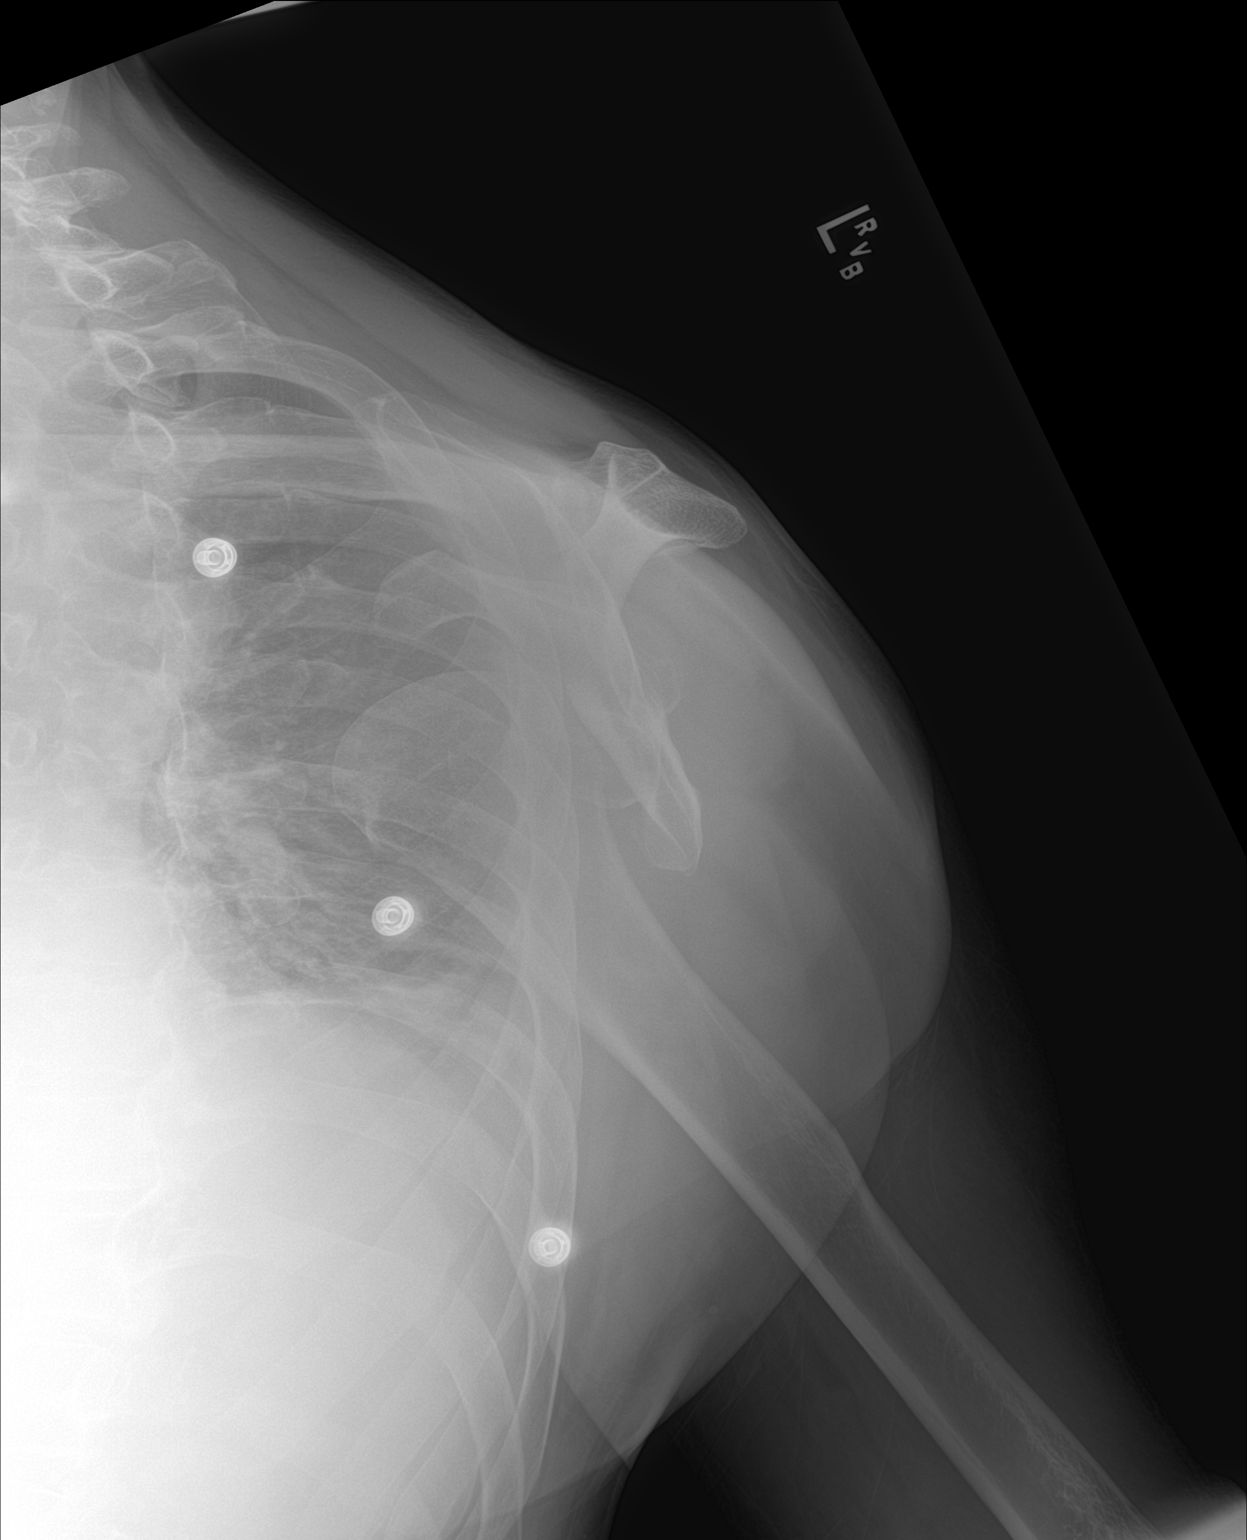

[2 of 2 positions shown; findings below may reference images not displayed]

FINDINGS: There is comminuted fracture in the lateral aspect of proximal
humerus. There is lateral displacement of fracture fragments. There
is anterior subcoracoid dislocation.
IMPRESSION: Fracture dislocation left shoulder.

## 2023-07-27 IMAGING — DX DG SHOULDER 1V*L*
2 series · 2 of 2 positions shown · non-contrast
Comparison: Left shoulder x-rays from same day.

CLINICAL DATA: Post reduction.

EXAM:
LEFT SHOULDER

[shoulder ap (1 of 2)]
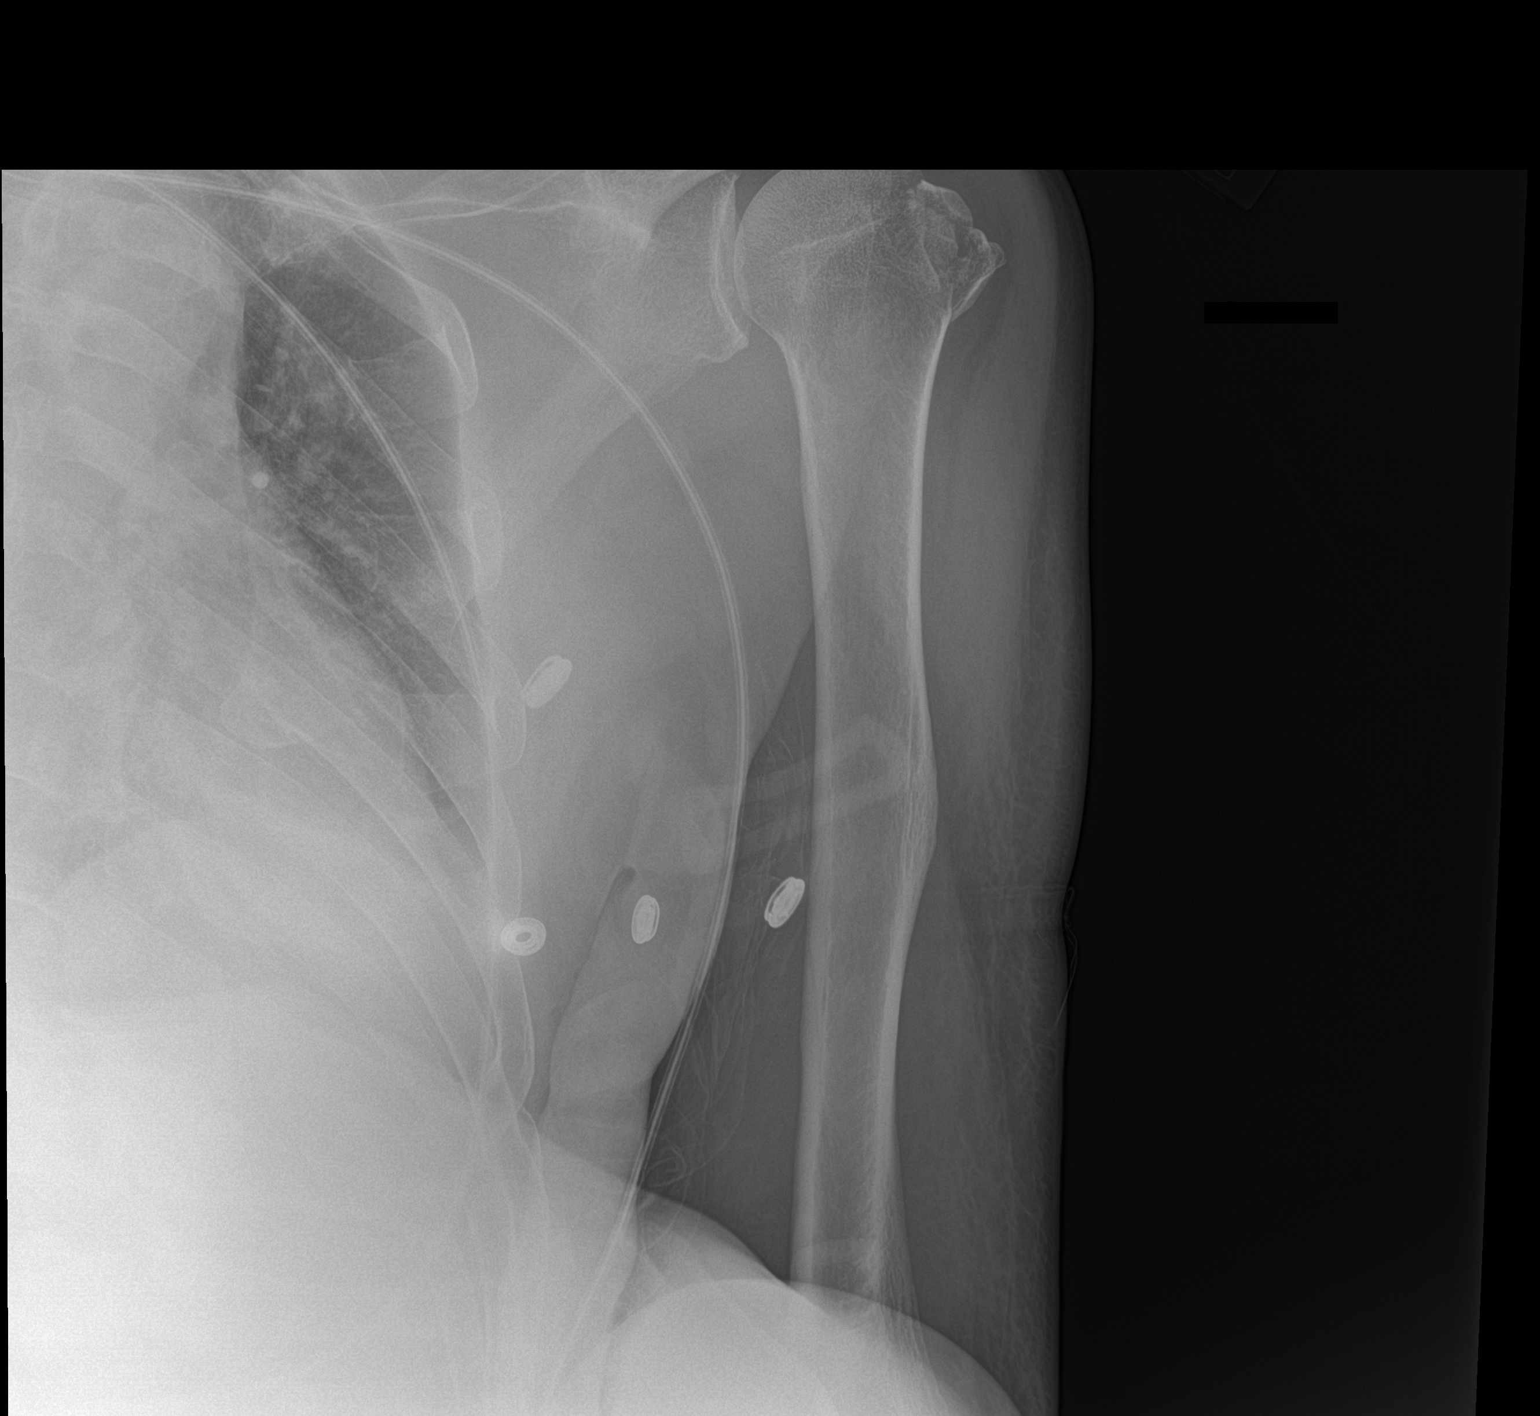

[shoulder ap (2 of 2)]
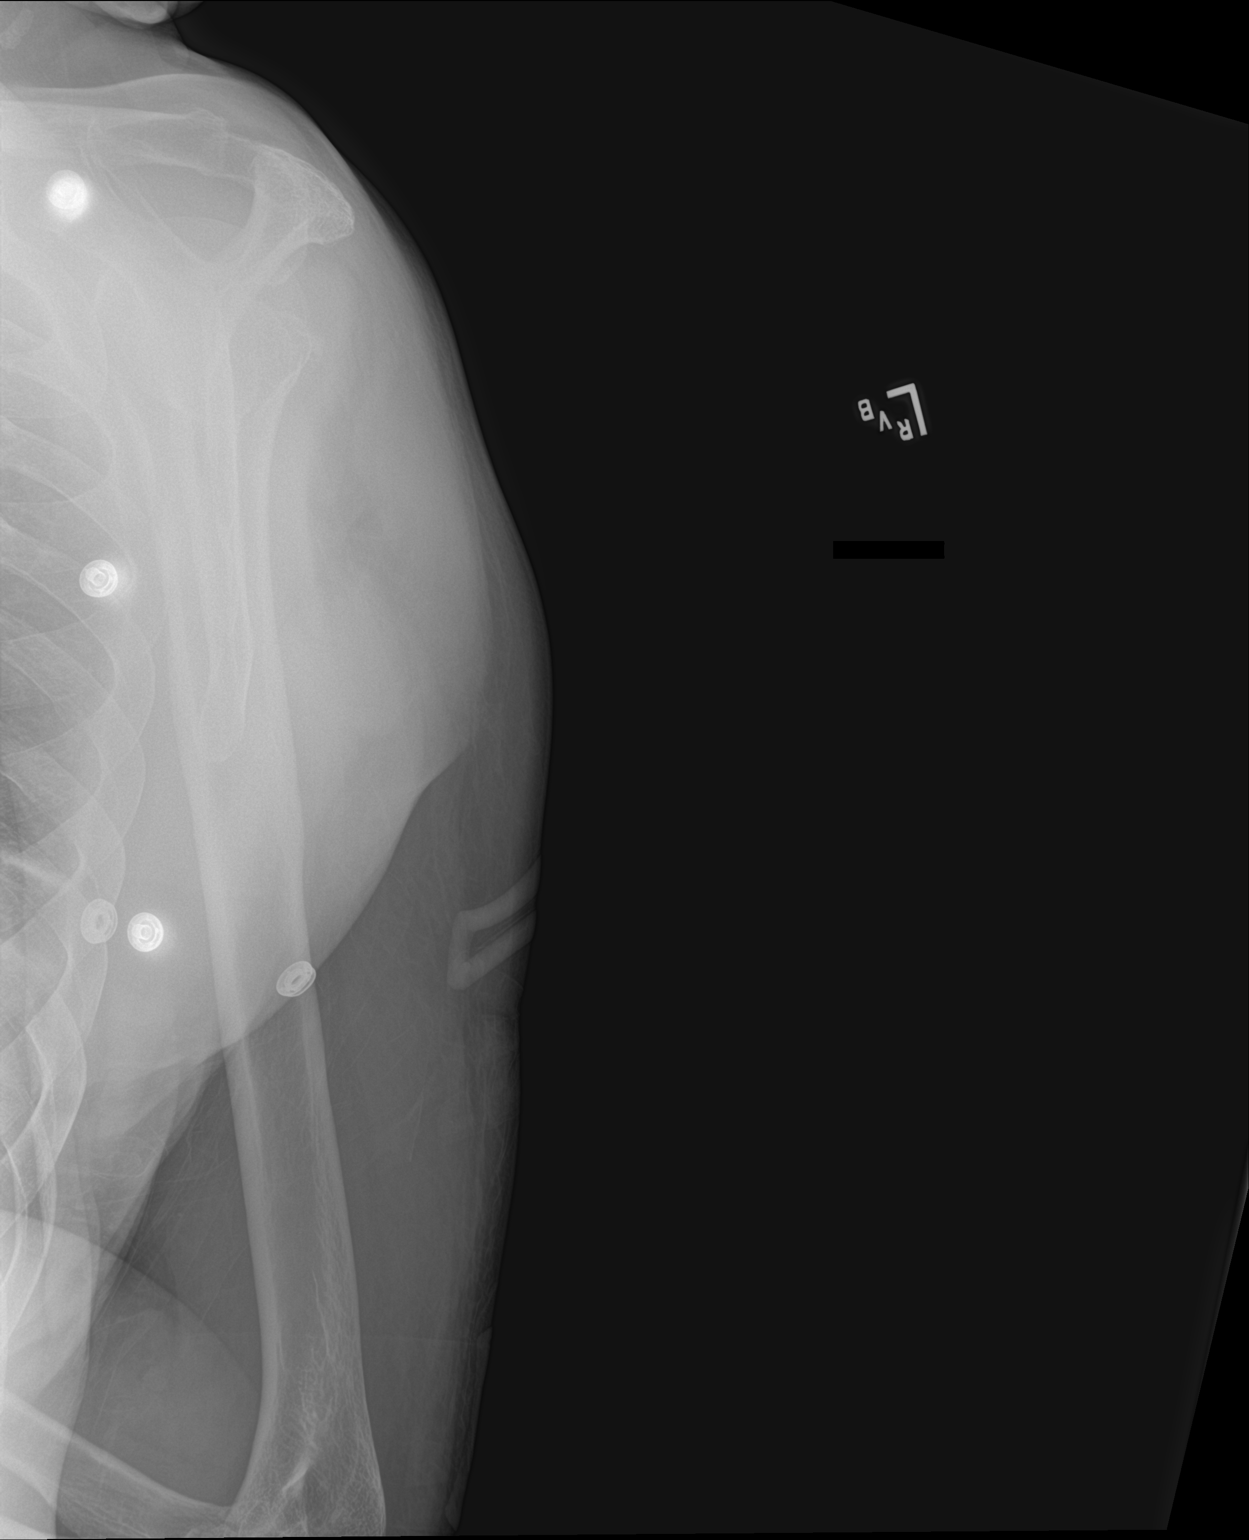

[2 of 2 positions shown; findings below may reference images not displayed]

FINDINGS: Interval reduction of the anterior shoulder dislocation. Improved
alignment of the acute comminuted mildly impacted greater tuberosity
fracture. Joint spaces are preserved. Soft tissues are unremarkable.
IMPRESSION: 1. Interval reduction of the anterior shoulder dislocation with
improved alignment of the greater tuberosity fracture.

## 2023-08-07 DIAGNOSIS — M1A9XX0 Chronic gout, unspecified, without tophus (tophi): Secondary | ICD-10-CM | POA: Diagnosis not present

## 2023-08-07 DIAGNOSIS — F4310 Post-traumatic stress disorder, unspecified: Secondary | ICD-10-CM | POA: Diagnosis not present

## 2023-08-07 DIAGNOSIS — R7303 Prediabetes: Secondary | ICD-10-CM | POA: Diagnosis not present

## 2023-08-12 DIAGNOSIS — F4310 Post-traumatic stress disorder, unspecified: Secondary | ICD-10-CM | POA: Diagnosis not present

## 2023-08-12 DIAGNOSIS — F3132 Bipolar disorder, current episode depressed, moderate: Secondary | ICD-10-CM | POA: Diagnosis not present

## 2023-08-19 DIAGNOSIS — M4326 Fusion of spine, lumbar region: Secondary | ICD-10-CM | POA: Diagnosis not present

## 2023-08-19 DIAGNOSIS — M48062 Spinal stenosis, lumbar region with neurogenic claudication: Secondary | ICD-10-CM | POA: Diagnosis not present

## 2023-08-19 DIAGNOSIS — M5416 Radiculopathy, lumbar region: Secondary | ICD-10-CM | POA: Diagnosis not present

## 2023-08-19 DIAGNOSIS — M1712 Unilateral primary osteoarthritis, left knee: Secondary | ICD-10-CM | POA: Diagnosis not present

## 2023-10-01 DIAGNOSIS — F4310 Post-traumatic stress disorder, unspecified: Secondary | ICD-10-CM | POA: Diagnosis not present

## 2023-10-01 DIAGNOSIS — F3132 Bipolar disorder, current episode depressed, moderate: Secondary | ICD-10-CM | POA: Diagnosis not present

## 2023-10-09 DIAGNOSIS — E1142 Type 2 diabetes mellitus with diabetic polyneuropathy: Secondary | ICD-10-CM | POA: Diagnosis not present

## 2023-10-09 DIAGNOSIS — Z0000 Encounter for general adult medical examination without abnormal findings: Secondary | ICD-10-CM | POA: Diagnosis not present

## 2023-10-14 ENCOUNTER — Other Ambulatory Visit: Payer: Self-pay | Admitting: Internal Medicine

## 2023-10-14 DIAGNOSIS — M109 Gout, unspecified: Secondary | ICD-10-CM

## 2023-10-15 NOTE — Telephone Encounter (Signed)
Requested medications are due for refill today.  yes  Requested medications are on the active medications list.  yes  Last refill. 04/16/2023 #90 1 rf  Future visit scheduled.   no  Notes to clinic.  Labs are expired. Pt needs an ov.    Requested Prescriptions  Pending Prescriptions Disp Refills   allopurinol (ZYLOPRIM) 100 MG tablet [Pharmacy Med Name: ALLOPURINOL 100 MG TABLET] 90 tablet 1    Sig: TAKE 1 TABLET BY MOUTH DAILY     Endocrinology:  Gout Agents - allopurinol Failed - 10/15/2023  2:43 PM      Failed - Uric Acid in normal range and within 360 days    Uric Acid, Serum  Date Value Ref Range Status  08/18/2016 6.4 4.0 - 8.0 mg/dL Final         Failed - Cr in normal range and within 360 days    Creatinine, Ser  Date Value Ref Range Status  08/22/2022 1.25 0.76 - 1.27 mg/dL Final   Creatinine,U  Date Value Ref Range Status  02/23/2018 193.1 mg/dL Final         Failed - Valid encounter within last 12 months    Recent Outpatient Visits           1 year ago Annual physical exam   [facility], Nyoka Cowden, MD   1 year ago Primary hypertension   [facility], Nyoka Cowden, MD   1 year ago Gout, arthritis   [facility], Nyoka Cowden, MD              Failed - CBC within normal limits and completed in the last 12 months    WBC  Date Value Ref Range Status  08/22/2022 4.3 3.4 - 10.8 x10E3/uL Final  04/08/2022 3.2 (L) 4.0 - 10.5 K/uL Final   RBC  Date Value Ref Range Status  08/22/2022 4.97 4.14 - 5.80 x10E6/uL Final  04/08/2022 4.18 (L) 4.22 - 5.81 MIL/uL Final   Hemoglobin  Date Value Ref Range Status  08/22/2022 13.5 13.0 - 17.7 g/dL Final   HGB  Date Value Ref Range Status  09/27/2012 13.3 13.0 - 17.1 g/dL Final   HCT  Date Value Ref Range Status  09/27/2012 38.7 38.4 - 49.9 % Final    Hematocrit  Date Value Ref Range Status  08/22/2022 44.2 37.5 - 51.0 % Final   MCHC  Date Value Ref Range Status  08/22/2022 30.5 (L) 31.5 - 35.7 g/dL Final  62/13/0865 78.4 30.0 - 36.0 g/dL Final   [facility]  Date Value Ref Range Status  08/22/2022 27.2 26.6 - 33.0 pg Final  04/08/2022 28.5 26.0 - 34.0 pg Final   MCV  Date Value Ref Range Status  08/22/2022 89 79 - 97 fL Final  09/27/2012 95.8 79.3 - 98.0 fL Final   No results found for: "PLTCOUNTKUC", "LABPLAT", "POCPLA" RDW  Date Value Ref Range Status  08/22/2022 13.5 11.6 - 15.4 % Final  09/27/2012 12.9 11.0 - 14.6 % Final

## 2023-11-02 DIAGNOSIS — Z8711 Personal history of peptic ulcer disease: Secondary | ICD-10-CM | POA: Diagnosis not present

## 2023-11-02 DIAGNOSIS — R799 Abnormal finding of blood chemistry, unspecified: Secondary | ICD-10-CM | POA: Diagnosis not present

## 2023-11-02 DIAGNOSIS — R1013 Epigastric pain: Secondary | ICD-10-CM | POA: Diagnosis not present

## 2023-11-02 DIAGNOSIS — Z125 Encounter for screening for malignant neoplasm of prostate: Secondary | ICD-10-CM | POA: Diagnosis not present

## 2023-11-12 DIAGNOSIS — F3132 Bipolar disorder, current episode depressed, moderate: Secondary | ICD-10-CM | POA: Diagnosis not present

## 2023-11-12 DIAGNOSIS — F4310 Post-traumatic stress disorder, unspecified: Secondary | ICD-10-CM | POA: Diagnosis not present

## 2023-11-17 ENCOUNTER — Other Ambulatory Visit: Payer: Self-pay | Admitting: Internal Medicine

## 2023-11-17 DIAGNOSIS — R1013 Epigastric pain: Secondary | ICD-10-CM | POA: Diagnosis not present

## 2023-11-17 DIAGNOSIS — Z8711 Personal history of peptic ulcer disease: Secondary | ICD-10-CM | POA: Diagnosis not present

## 2023-11-17 DIAGNOSIS — M1A9XX0 Chronic gout, unspecified, without tophus (tophi): Secondary | ICD-10-CM | POA: Diagnosis not present

## 2023-11-17 DIAGNOSIS — M109 Gout, unspecified: Secondary | ICD-10-CM

## 2023-11-18 NOTE — Telephone Encounter (Signed)
 Requested medication (s) are due for refill today: yes  Requested medication (s) are on the active medication list: ys  Last refill:  10/15/23  Future visit scheduled: no  Notes to clinic:  Unable to refill per protocol, courtesy refill already given, routing for provider approval.      Requested Prescriptions  Pending Prescriptions Disp Refills   allopurinol (ZYLOPRIM) 100 MG tablet [Pharmacy Med Name: ALLOPURINOL 100 MG TABLET] 30 tablet 0    Sig: TAKE 1 TABLET BY MOUTH DAILY     Endocrinology:  Gout Agents - allopurinol Failed - 11/18/2023 10:30 AM      Failed - Uric Acid in normal range and within 360 days    Uric Acid, Serum  Date Value Ref Range Status  08/18/2016 6.4 4.0 - 8.0 mg/dL Final         Failed - Cr in normal range and within 360 days    Creatinine, Ser  Date Value Ref Range Status  08/22/2022 1.25 0.76 - 1.27 mg/dL Final   Creatinine,U  Date Value Ref Range Status  02/23/2018 193.1 mg/dL Final         Failed - Valid encounter within last 12 months    Recent Outpatient Visits           1 year ago Annual physical exam   [facility] at [facility], Nyoka Cowden, MD   1 year ago Primary hypertension   [facility], Nyoka Cowden, MD   1 year ago Gout, arthritis   [facility] at [facility], Nyoka Cowden, MD              Failed - CBC within normal limits and completed in the last 12 months    WBC  Date Value Ref Range Status  08/22/2022 4.3 3.4 - 10.8 x10E3/uL Final  04/08/2022 3.2 (L) 4.0 - 10.5 K/uL Final   RBC  Date Value Ref Range Status  08/22/2022 4.97 4.14 - 5.80 x10E6/uL Final  04/08/2022 4.18 (L) 4.22 - 5.81 MIL/uL Final   Hemoglobin  Date Value Ref Range Status  08/22/2022 13.5 13.0 - 17.7 g/dL Final   HGB  Date Value Ref Range Status  09/27/2012 13.3 13.0 - 17.1 g/dL Final   HCT  Date Value Ref  Range Status  09/27/2012 38.7 38.4 - 49.9 % Final   Hematocrit  Date Value Ref Range Status  08/22/2022 44.2 37.5 - 51.0 % Final   MCHC  Date Value Ref Range Status  08/22/2022 30.5 (L) 31.5 - 35.7 g/dL Final  16/06/9603 54.0 30.0 - 36.0 g/dL Final   [facility]  Date Value Ref Range Status  08/22/2022 27.2 26.6 - 33.0 pg Final  04/08/2022 28.5 26.0 - 34.0 pg Final   MCV  Date Value Ref Range Status  08/22/2022 89 79 - 97 fL Final  09/27/2012 95.8 79.3 - 98.0 fL Final   No results found for: "PLTCOUNTKUC", "LABPLAT", "POCPLA" RDW  Date Value Ref Range Status  08/22/2022 13.5 11.6 - 15.4 % Final  09/27/2012 12.9 11.0 - 14.6 % Final

## 2023-11-24 ENCOUNTER — Telehealth: Payer: Self-pay | Admitting: Neurology

## 2023-11-24 NOTE — Telephone Encounter (Signed)
 error

## 2023-12-16 DIAGNOSIS — M1712 Unilateral primary osteoarthritis, left knee: Secondary | ICD-10-CM | POA: Diagnosis not present

## 2023-12-16 DIAGNOSIS — M5416 Radiculopathy, lumbar region: Secondary | ICD-10-CM | POA: Diagnosis not present

## 2023-12-16 DIAGNOSIS — F4310 Post-traumatic stress disorder, unspecified: Secondary | ICD-10-CM | POA: Diagnosis not present

## 2023-12-16 DIAGNOSIS — F3132 Bipolar disorder, current episode depressed, moderate: Secondary | ICD-10-CM | POA: Diagnosis not present

## 2023-12-16 DIAGNOSIS — M4326 Fusion of spine, lumbar region: Secondary | ICD-10-CM | POA: Diagnosis not present

## 2023-12-16 DIAGNOSIS — M48062 Spinal stenosis, lumbar region with neurogenic claudication: Secondary | ICD-10-CM | POA: Diagnosis not present

## 2023-12-23 DIAGNOSIS — Z5181 Encounter for therapeutic drug level monitoring: Secondary | ICD-10-CM | POA: Diagnosis not present

## 2023-12-23 DIAGNOSIS — Z79899 Other long term (current) drug therapy: Secondary | ICD-10-CM | POA: Diagnosis not present

## 2024-01-21 DIAGNOSIS — E1142 Type 2 diabetes mellitus with diabetic polyneuropathy: Secondary | ICD-10-CM | POA: Diagnosis not present

## 2024-01-25 DIAGNOSIS — M546 Pain in thoracic spine: Secondary | ICD-10-CM | POA: Diagnosis not present

## 2024-01-25 DIAGNOSIS — M5459 Other low back pain: Secondary | ICD-10-CM | POA: Diagnosis not present

## 2024-01-25 DIAGNOSIS — M545 Low back pain, unspecified: Secondary | ICD-10-CM | POA: Diagnosis not present

## 2024-01-27 ENCOUNTER — Encounter: Payer: Self-pay | Admitting: Neurology

## 2024-01-27 ENCOUNTER — Ambulatory Visit: Admitting: Neurology

## 2024-01-27 VITALS — BP 109/77 | HR 105 | Ht 70.0 in | Wt 216.0 lb

## 2024-01-27 DIAGNOSIS — I951 Orthostatic hypotension: Secondary | ICD-10-CM

## 2024-01-27 DIAGNOSIS — G4731 Primary central sleep apnea: Secondary | ICD-10-CM | POA: Diagnosis not present

## 2024-01-27 DIAGNOSIS — Z79899 Other long term (current) drug therapy: Secondary | ICD-10-CM

## 2024-01-27 DIAGNOSIS — G4736 Sleep related hypoventilation in conditions classified elsewhere: Secondary | ICD-10-CM | POA: Insufficient documentation

## 2024-01-27 DIAGNOSIS — R6889 Other general symptoms and signs: Secondary | ICD-10-CM | POA: Insufficient documentation

## 2024-01-27 NOTE — Patient Instructions (Signed)
 ASSESSMENT AND PLAN  63 y.o. year old male  here with:    1)  Needs letter to [place] .  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   2)  he wants to start again to treat with BIPAP, needs a new prescription for the device . [facility] to keep [facility] passage from "collapsing".   3) still in chronic pain.    We need to get a new BIPAP titration as the patient hasn't used BiPAP in over 6 months and his sleep study is almost a year old.  I ordered that study with a male P[employer] consultant.  I have promised to write a letter to the [place], documentation of the time line of nasal surgeries and dx of OSA then conversion into  CSA  and BiPAP need , all in relation of the multiple nasal airway surgeries and UPPP.     I would like to thank and Dr Laurier Poplin on [employer].  And the [place] [facility], Milderd Alken,  for allowing me to meet with and to take care of this pleasant patient  After spending a total time of  35  minutes face to face and additional time for physical and neurologic examination, review of laboratory studies,  personal review of imaging studies, reports and results of other testing and review of referral information / records as far as provided in visit,

## 2024-01-27 NOTE — Progress Notes (Signed)
 Provider:  Neomia Banner, MD  Primary Care Physician:  Sheron Dixons, MD [address] [place] [address]     Referring Provider: Sheron Dixons, Md [address] [place],  [place] [address]          Chief Complaint according to patient   Patient presents with:                HISTORY OF PRESENT ILLNESS:   [facility]  patient - needs a letter .   Hector [name] is a 63 y.o. male patient who is here for revisit 01/27/2024 for  BiPAP intolerance. He attributed this to a collapse of his left nasal passage and felt he was smothered.  He never had the same problem while his CPAP was used. He had several ENT surgeries over decades and he has now no longer a patent nasal passage-   Chief concern according to patient :  " I filed a claim for sleep apnea with the [place], stating that my VA guided nasal surgery was a cause for the sleep apnea, snoring, and interrupted sleep.  I left the [employer] in 1994 and had already 2 septoplasties. "  Septoplasty and nasal surgeries have predated the  dx of sleep apnea.  I developed renal failure with dehydration and fully recovered , in that time I was having central apneas and started BiPAP".    Complex apnea was partially attributed to narcotics therapy.  Reportedly had dehydration related renal failure. Had GI bleed, upper , bleeding ulcer - blood loss. Anemia-   Dr Miki Alert is the primary and GI physician. Now on Prilosec.        History of present illness:     Hector [name] is a 63 y.o.  male patient seen here as a referral on 01/27/2022 from Dr. Rochelle Chu for a RV and consultation. He has been using CPAP but not seen benefit, has very, very high residual AHI. He is a CHRONIC PAIN PATIENT- NARCOTIC MEDS LIKELY CONTRIBUTING, high risk of central apnea.     He underwent a HST last year- result was leaning towards central apnea. Returned for in- lab titration 11-04-2021. BiPAP was asked to be titrated: he remained on CPAP  after BiPAP increased his residual apnea further. He was given a FFM, but had asked for a nasal. Here to be BiPAP titrated.    The patient endorsed the Epworth Sleepiness Scale at 14 points.   The patient's weight 192 pounds with a height of 70 (inches), resulting in a BMI of 27.5 kg/m2. The patient's neck circumference measured 15.5 inches.   CURRENT MEDICATIONS: Tylenol , Albuterol , Zyloprim , Celebrex, Lexapro , Krill and Fish Oil Blend, Folvite , Dilaudid , Lamictal , Cozaar , Mobic , Prilosec, Percocet, Lyrica , E-Z Spacer, Flomax , Desyrel , Vitamin E, Zinc  Gluconate, Nitrostat      PROCEDURE:  This is a multichannel digital polysomnogram utilizing the SomnoStar 11.2 system.  Electrodes and sensors were applied and monitored per AASM Specifications.   EEG, EOG, Chin and Limb EMG, were sampled at 200 Hz.  ECG, Snore and Nasal Pressure, Thermal Airflow, Respiratory Effort, CPAP Flow and Pressure, Oximetry was sampled at 50 Hz. Digital video and audio were recorded.       BiPAP was initiated under a medium sized EVORA nasal mask- 2 other mask were tried - trazodone  and oxycodone  were taken before sleep onset.  BiPAP was started at 10/ 5 cm water and increased to 13/8 cm with  ST function ST= 10 because of hypopneas, apneas and desaturations.  At a PAP pressure of 13/8 cmH20, there was a reduction of the AHI to 1.7/h with improvement of sleep apnea. Oral venting.    Lights Out was at 20:59 and Lights On at 04:59. Total recording time (TRT) was 481 minutes, with a total sleep time (TST) of 458.5 minutes. The patient's sleep latency was 13.5 minutes. REM latency was 141.5 minutes.  The sleep efficiency was 95.3 %.     SLEEP ARCHITECTURE: WASO (Wake after sleep onset)  was 18.5 minutes.  There were 14.5 minutes in Stage N1, 360.5 minutes Stage N2, 0 minutes Stage N3 and 83.5 minutes in Stage REM.  The percentage of Stage N1 was 3.2%, Stage N2 was 78.6%, Stage N3 was 0% and Stage R (REM sleep) was 18.2%.         RESPIRATORY ANALYSIS:  There was a total of 82 respiratory events: 31 obstructive apneas, 41 central apneas and 0 mixed apneas with a total of 72 apneas and an apnea index (AHI) of 9.4 /hour. There were 10 hypopneas with a hypopnea index of 1.3/hour.    The total APNEA/HYPOPNEA INDEX  (AHI) was 10.7 /hour . 10 events occurred in REM sleep and 72 events in NREM. The REM AHI was 7.2 /hour versus a non-REM AHI of 11.52 /hour.  The patient spent 133 minutes of total sleep time in the supine position and 326 minutes in non-supine. The supine AHI was 20.3, versus a non-supine AHI of 6.8.   OXYGEN SATURATION & C02:  The baseline 02 saturation was 95%, with the lowest being 78%. Time spent below 89% saturation equaled 39 minutes.   PERIODIC LIMB MOVEMENTS:  The patient had a total of 0 Periodic Limb Movements.  The arousals were noted as: 29 were spontaneous, 0 were associated with PLMs, 7 were associated with respiratory events.  Snoring was noted. EKG was regular.      The patient was fitted with a Small Vitera full face mask and used an EVORA nasal mask. Comparison shows bother well tolerated.   DIAGNOSIS Central Sleep Apnea responded well to BiPAP ST - the patient slept 177 minutes under 13/ 8 cm BiPAP ST 10.  AHI was 1.7/h !  Sleep Related Hypoxemia was still present, 39 minutes !     PLANS/RECOMMENDATIONS: Start with BiPAP -ST as above. Patient feels he cannot tolerate FFM , lets use the nasal Evora with a chin strap. He may be more tolerant of a FFM in the future.       Review of Systems: Out of a complete 14 system review, the patient complains of only the following symptoms, and all other reviewed systems are negative.:   12/ 24  Epworth  sleepiness score, FSS at 43/ 63 points    Social History   Socioeconomic History   Marital status: Married    Spouse name: Janine   Number of children: 2   Years of education: Associates   Highest education level: Not on file  Occupational  History   Occupation: retired    Associate Professor: [employer]  Tobacco Use   Smoking status: Never   Smokeless tobacco: Never  Vaping Use   Vaping status: Never Used  Substance and Sexual Activity   Alcohol use: Yes    Comment: occas liquor    Drug use: No   Sexual activity: Yes  Other Topics Concern   Not on file  Social History Narrative   Patient  is married Actuary) and lives at home with his wife.   Patient has two children.   Patient has a college education.   Regular exercise-no   Employed Patent examiner and investigation   school travels       10 hours work     Sleep  No longer needs  cpap   Patient is right-handed.   Patient drinks one soda daily.   trainiing for [employer]      Social Drivers of Health   Financial Resource Strain: Low Risk  (04/19/2020)   Received from [facility], [facility]   Overall Financial Resource Strain (CARDIA)    Difficulty of Paying Living Expenses: Not hard at all  Food Insecurity: No Food Insecurity (04/19/2020)   Received from [facility], [facility]   Hunger Vital Sign    Worried About Running Out of Food in the Last Year: Never true    Ran Out of Food in the Last Year: Never true  Transportation Needs: No Transportation Needs (04/19/2020)   Received from [facility], [facility]   [facility] - Transportation    In the past 12 months, has lack of transportation kept you from medical appointments or from getting medications?: No    Lack of Transportation (Non-Medical): No  Physical Activity: Inactive (04/19/2020)   Received from [facility], [facility]   Exercise Vital Sign    Days of Exercise per Week: 0 days    Minutes of Exercise per Session: 0 min  Stress: No Stress Concern Present (04/19/2020)   Received from [facility], [facility]   [employer] of Occupational Health - Occupational Stress Questionnaire    Feeling of Stress : Not at all  Social Connections: Socially Integrated (04/19/2020)   Received from [facility], [facility]   Social Connection and Isolation Panel [NHANES]    Frequency of Communication with Friends and Family: Three times a week    Frequency of Social Gatherings with Friends and Family: Once a week    Attends Religious Services: 1 to 4 times per year    Active Member of [employer] or Organizations: Yes    Attends Engineer, structural: More than 4 times per year    Marital Status: Married    Family History  Problem Relation Age of Onset   Uterine cancer Mother    Hypertension Mother    Cervical cancer Mother    Heart disease Father    COPD Father    Seizures Father    Arthritis Father    Arthritis Sister    Diabetes Sister    Colon cancer Maternal Uncle    Colon cancer Paternal Aunt    Diabetes Paternal Aunt    Heart disease Maternal Grandmother    Heart disease Maternal Grandfather    Heart disease Paternal Grandmother    Heart disease Paternal Grandfather    Diabetes Paternal Uncle     Past Medical History:  Diagnosis Date   Abnormal LFTs    improved after gastic bypass   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    Anxiety    Ascending aortic aneurysm (HCC)    a. 06/2018 CT: 4.1cm.   B12 nutritional deficiency    Bipolar  disorder [facility])    Carpal tunnel syndrome 04/06/2007   Qualifier: Diagnosis of  By: Bambi Bonine, CMA (AAMA), Cathleen Coach S    Chest pain    a. 2007 MV: No ischemia; b. 09/2018 recurrent c/p in setting of PE; c. 09/2018 MV: No ischemia/infarct. EF 55-65%.   Closed head injury    laceration  9 12  no loc  vision change   Complete rotator cuff tear 04/09/2022   DDD (degenerative disc disease)    Depression     Diabetes mellitus    resolved with gastric bypass   GERD (gastroesophageal reflux disease)    no meds   Gout    Headache    once per week uses Indomethicin hx of cluster headaches    History of bunionectomy of left great toe    History of concussion    History of echocardiogram    a. 09/2018 Echo: Ef 65-70%, no rwma, mild MR. Ao root 41mm (mildly dil).   History of kidney stones    History of renal stone    Seen in emergency room not urologist   Hyperlipidemia    Hypertension 02/27/2017   Neuromuscular disorder (HCC)    Nonspecific pain in the lumbar region    OBESITY 12/10/2007   a. s/p gastric bypass.   OSA (obstructive sleep apnea) 12/12/2013   resolved by weight loss surgery 10/26/2009   Osteoarthritis of knee    Pain in left knee    Pancreatitis 03/21/2016   went to [place]   Perforated ulcer [facility])    Surgical repair9/24 2011   Peripheral neuropathy    PONV (postoperative nausea and vomiting)    PTSD (post-traumatic stress disorder)    Pulmonary embolism (HCC)    a. 09/18/2018 CTA Chest: RLL segmental arterial branch small volume PE-->Eliquis .   remote Lumbar radiculopathy 10/01/2021   Sleep-related hypoxia 11/10/2021    Past Surgical History:  Procedure Laterality Date   ABDOMINAL EXPOSURE N/A 03/29/2020   Procedure: ABDOMINAL EXPOSURE;  Surgeon: Mayo Speck, MD;  Location: [facility] OR;  Service: Vascular;  Laterality: N/A;   ANTERIOR LUMBAR FUSION N/A 03/29/2020   Procedure: ANTERIOR LUMBAR FUSION (ALIF) L4-5;  Surgeon: Mort Ards, MD;  Location: [facility] OR;  Service: Orthopedics;  Laterality: N/A;  5.5 hrs Dr. Shirley Douglas do approach Lt tap block with exparel    BACK SURGERY  2012   Dr. Linden Revels disk   CARPAL TUNNEL RELEASE  2008   left-ulnar nerve decomp   carpel tunnel Left    CHOLECYSTECTOMY N/A 03/21/2016   Procedure: LAPAROSCOPIC CHOLECYSTECTOMY WITH INTRAOPERATIVE CHOLANGIOGRAM;  Surgeon: Shela Derby, MD;  Location: [facility] ORS;  Service: General;  Laterality: N/A;    colonscopy      DEBRIDEMENT AND CLOSURE WOUND N/A 05/09/2020   Procedure: Irrigation and debridement of superficial wound closure;  Surgeon: Mort Ards, MD;  Location: [facility] OR;  Service: Orthopedics;  Laterality: N/A;  1 hr   DIAGNOSTIC LAPAROSCOPY     laparoscopic gastric bypass   EPIDURAL BLOCK INJECTION     by Dr. Rexanne Catalina   FOOT SURGERY     bilat for removal of extra bone    GASTRIC BYPASS  11/06/09   beginning weight 267   HARDWARE REMOVAL N/A 05/02/2016   Procedure: Removal of hardware Lumbar five-Sacral one with Metrex;  Surgeon: Elna Haggis, MD;  Location: [facility]O ORS;  Service: Neurosurgery;  Laterality: N/A;  Removal of hardware L5-S1 with Metrex   HERNIA REPAIR  1998   umbilical   HIATAL  HERNIA REPAIR N/A 01/20/2013   Procedure: LAPAROSCOPIC REPAIR OF INTERNAL HERNIA;  Surgeon: Thayne Fine, MD;  Location: [facility] ORS;  Service: General;  Laterality: N/A;   LUMBAR LAMINECTOMY/DECOMPRESSION MICRODISCECTOMY Left 06/07/2014   Procedure: MICRO LUMBAR DECOMPRESSION L5 - S1 ON THE LEFT/L5 FORAMINOTOMY/EXCISION SYNOVIAL CYST  1 LEVEL;  Surgeon: Loel Ring, MD;  Location: [facility] ORS;  Service: Orthopedics;  Laterality: Left;   MASS EXCISION Right 04/18/2013   Procedure: RIGHT SMALL FINGER SKIN LESION EXCISION;  Surgeon: Sheryl Donna, MD;  Location: [facility];  Service: Orthopedics;  Laterality: Right;   NASAL SEPTOPLASTY W/ TURBINOPLASTY     x 2   NEPHROLITHOTOMY Right 11/03/2014   Procedure: RIGHT PERCUTANEOUS NEPHROLITHOTOMY;  Surgeon: Alanson Alliance, MD;  Location: [facility]S;  Service: Urology;  Laterality: Right;   SHOULDER ARTHROSCOPY  2014   rt   SHOULDER ARTHROSCOPY WITH ROTATOR CUFF REPAIR AND SUBACROMIAL DECOMPRESSION Left 04/09/2022   Procedure: LEFT SHOULDER ARTHROSCOPY WITH MINI OPEN ROTATOR CUFF REPAIR, SUBACROMIAL DECOMPRESSION, POSSIBLE DISTAL CLAVICLE RESECTION AND BONE GRAFTING HUMERAL HEAD WITH ALLOGRAFT AND AUTOLOGOUS BONE;  Surgeon: Orvan Blanch, MD;   Location: [facility]S;  Service: Orthopedics;  Laterality: Left;  2 hrs   surgerical repair of stomach ulcer  06/08/10   per   TONSILLECTOMY     UVULOPALATOPHARYNGOPLASTY  2007   with tonsils   VASECTOMY       Current Outpatient Medications on File Prior to Visit  Medication Sig Dispense Refill   acetaminophen  (TYLENOL ) 500 MG tablet Take 1,000 mg by mouth every 6 (six) hours as needed for mild pain.     albuterol  (PROVENTIL  HFA;VENTOLIN  HFA) 108 (90 Base) MCG/ACT inhaler Inhale 2 puffs into the lungs every 6 (six) hours as needed for wheezing or shortness of breath. 1 Inhaler 0   allopurinol  (ZYLOPRIM ) 100 MG tablet TAKE 1 TABLET BY MOUTH DAILY 30 tablet 0   celecoxib (CELEBREX) 200 MG capsule Take 200 mg by mouth daily as needed for mild pain or moderate pain.     colchicine  0.6 MG tablet TAKE ONE TABLET BY MOUTH DAILY AS NEEDED FOR GOUT 30 tablet 1   escitalopram  (LEXAPRO ) 20 MG tablet Take 20 mg by mouth every morning.      Fish Oil-Krill Oil (KRILL & FISH OIL BLEND PO) Take 1 capsule by mouth daily.     lamoTRIgine  (LAMICTAL ) 100 MG tablet Take 150 mg by mouth 2 (two) times daily.      methocarbamol  (ROBAXIN ) 500 MG tablet Take 1 tablet (500 mg total) by mouth daily as needed for muscle spasms. 90 tablet 0   MOUNJARO 7.5 MG/0.5ML Pen Inject 7.5 mg into the skin once a week.     Multiple Vitamin (MULTIVITAMIN) capsule Take 1 capsule by mouth daily.     oxyCODONE -acetaminophen  (PERCOCET) 10-325 MG tablet Take 1 tablet by mouth 4 (four) times daily as needed for pain.     pregabalin  (LYRICA ) 150 MG capsule Take 150 mg by mouth 3 (three) times daily.     Spacer/Aero-Holding Chambers (E-Z SPACER) inhaler Use as instructed 1 each 2   tamsulosin  (FLOMAX ) 0.4 MG CAPS capsule Take 0.4 mg by mouth daily as needed (kidney stones).      traZODone  (DESYREL ) 100 MG tablet Take 300 mg by mouth at bedtime as needed for sleep.     losartan  (COZAAR ) 25 MG tablet Take 1 tablet (25 mg total) by mouth daily.  Take if SBP >130 mmHg in am and  take additional dose at night if SBP >130 mmHg (Patient taking differently: Take 25 mg by mouth as needed. Take if SBP >130 mmHg in am and take additional dose at night if SBP >130 mmHg) 90 tablet 3   nitroGLYCERIN  (NITROSTAT ) 0.3 MG SL tablet Place 1 tablet (0.3 mg total) under the tongue every 5 (five) minutes as needed for chest pain. 100 tablet 3   [DISCONTINUED] enoxaparin  (LOVENOX ) 40 MG/0.4ML injection Inject 0.4 mLs (40 mg total) into the skin daily. 10 day supply 1 injection per day (Patient not taking: No sig reported) 4 mL 0   No current facility-administered medications on file prior to visit.    Allergies  Allergen Reactions   Aspirin  Other (See Comments)    perforated ulcer   Cymbalta [Duloxetine Hcl] Other (See Comments)    Over-sedating; slept over 24 hours with one dose.  Possible serotonin syndrome.   Nsaids Other (See Comments)    Perforated ulcer   Tape Rash    Surgical tape   Hydrocodone  Other (See Comments)    headache   Vancomycin  Rash    Red man syndrome.  Please premedicate.     DIAGNOSTIC DATA (LABS, IMAGING, TESTING) - I reviewed patient records, labs, notes, testing and imaging myself where available.  Lab Results  Component Value Date   WBC 4.3 08/22/2022   HGB 13.5 08/22/2022   HCT 44.2 08/22/2022   MCV 89 08/22/2022   PLT 135 (L) 08/22/2022      Component Value Date/Time   NA 145 (H) 08/22/2022 0714   K 5.1 08/22/2022 0714   CL 107 (H) 08/22/2022 0714   CO2 23 08/22/2022 0714   GLUCOSE 118 (H) 08/22/2022 0714   GLUCOSE 138 (H) 04/10/2022 0348   BUN 10 08/22/2022 0714   CREATININE 1.25 08/22/2022 0714   CALCIUM  9.4 08/22/2022 0714   PROT 7.2 08/22/2022 0714   ALBUMIN 4.5 08/22/2022 0714   AST 26 08/22/2022 0714   ALT 18 08/22/2022 0714   ALKPHOS 140 (H) 08/22/2022 0714   BILITOT 0.2 08/22/2022 0714   GFRNONAA >60 04/10/2022 0348   GFRAA >60 05/08/2020 0841   Lab Results  Component Value Date   CHOL  141 08/22/2022   HDL 45 08/22/2022   LDLCALC 71 08/22/2022   LDLDIRECT 113.2 04/01/2007   TRIG 144 08/22/2022   CHOLHDL 3.1 08/22/2022   Lab Results  Component Value Date   HGBA1C 5.4 04/08/2022   Lab Results  Component Value Date   VITAMINB12 304 08/22/2022   Lab Results  Component Value Date   TSH 1.860 08/22/2022    PHYSICAL EXAM:  Today's Vitals   01/27/24 0820  BP: 109/77  Pulse: (!) 105  Weight: 216 lb (98 kg)  Height: 5\' 10"  (1.778 m)   Body mass index is 30.99 kg/m.   Wt Readings from Last 3 Encounters:  01/27/24 216 lb (98 kg)  07/17/22 198 lb (89.8 kg)  04/09/22 195 lb 1.7 oz (88.5 kg)     Ht Readings from Last 3 Encounters:  01/27/24 5\' 10"  (1.778 m)  07/17/22 5\' 10"  (1.778 m)  04/09/22 5\' 10"  (1.778 m)      General: The patient is awake, alert and appears not in acute distress. The patient is well groomed. Head: Normocephalic, atraumatic. He looks pale ,   Neck is supple.  Mallampati 2, pale !!! Status post UPPP, several nasal surgeries left him with a left sided "collapsed nasal passage upon air intake.  neck circumference:15.5  inches .  Dental status: poor.  Cardiovascular:  Regular rate and cardiac rhythm by pulse,  without distended neck veins. Respiratory: Lungs are clear to auscultation.  Skin:  Without evidence of ankle edema, bruising, petechiae.  Trunk: The patient's posture is erect.   Neurologic exam : The patient is awake and alert, oriented to place and time.   Memory subjective described as intact.  Attention span & concentration ability appears normal.  Speech is fluent,  without  dysarthria ( no lisp) ,  mild dysphonia , but no aphasia.  Mood and affect are appropriate. He is alert, cooperative, well groomed.    Cranial nerves: no loss of smell or taste reported  Pupils are equal and briskly reactive to light. Funduscopic exam deferred.  Extraocular movements in vertical and horizontal planes were intact and without  nystagmus. Hearing was intact .    Facial sensation intact to fine touch.  Facial motor strength is symmetric and tongue and uvula move midline.  Neck ROM : rotation, tilt and flexion extension were normal for age and shoulder shrug was asymmetrical. -  History of  fall  with injury to left shoulder. Left  shoulder is higher.    Motor exam:  Symmetric bulk, tone and ROM.   including ROM testing in left arm which was injured in a fall 06-2021.  Normal tone without cog-wheeling, symmetric grip strength .   Sensory:  Fine touch  and vibration were diminished at both knees and absent in ankles and feet, toes.   Gait and station: Patient could rise unassisted from a seated position, not bracing, walked without assistive device.  Stance is of normal width/ base and the patient turned with 4-5 steps.  Toe and heel walk were deferred.   The patient cannot perform a tandem gait.    Deep tendon reflexes: in the lower extremities are brisk patella reflexes, unusual for a neuropathy. Spinal stenosis , DDD, status post 2 fusions.     ASSESSMENT AND PLAN 63 y.o. year old male  here with:    1)  Needs letter to [place] .  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   2)  he wants to start again to treat with BIPAP, needs a new prescription for the device . Needs [facility] consult to keep nasal passage from "collapsing".   3) still in chronic pain.     I would like to thank and Dr Laurier Poplin on [employer].  And the [place] [facility] [place], Milderd Alken,  for allowing me to meet with and to take care of this pleasant patient  After spending a total time of  35  minutes face to face and additional time for physical and neurologic examination, review of laboratory studies,  personal review of imaging studies, reports and results of other testing and review of referral information /  records as far as provided in visit,   Electronically signed by: Neomia Banner, MD 01/27/2024 9:09 AM  [facility] and [facility] Board certified by The [employer] of Sleep Medicine and Diplomate of the [employer] of Sleep Medicine. Board certified In Neurology through the ABPN, Fellow of the [employer] of Neurology.

## 2024-01-29 DIAGNOSIS — R1013 Epigastric pain: Secondary | ICD-10-CM | POA: Diagnosis not present

## 2024-01-29 DIAGNOSIS — M1A9XX0 Chronic gout, unspecified, without tophus (tophi): Secondary | ICD-10-CM | POA: Diagnosis not present

## 2024-01-29 DIAGNOSIS — Z1331 Encounter for screening for depression: Secondary | ICD-10-CM | POA: Diagnosis not present

## 2024-01-29 DIAGNOSIS — E1142 Type 2 diabetes mellitus with diabetic polyneuropathy: Secondary | ICD-10-CM | POA: Diagnosis not present

## 2024-02-03 ENCOUNTER — Telehealth: Payer: Self-pay | Admitting: Neurology

## 2024-02-03 NOTE — Telephone Encounter (Signed)
 Bipap BCBS fed pending

## 2024-02-11 DIAGNOSIS — R29898 Other symptoms and signs involving the musculoskeletal system: Secondary | ICD-10-CM | POA: Diagnosis not present

## 2024-02-16 NOTE — Telephone Encounter (Signed)
 Bipap BCBS fed Hector [name]: [id number] (exp. 02/11/24 to 03/11/24)

## 2024-02-18 NOTE — Telephone Encounter (Signed)
 Patient is scheduled at [facility] for 03/22/24 at 9 pm.  Mailed packet and sent mychart.  The auth will expire before the SS appt. I will redo the auth.

## 2024-02-22 NOTE — Telephone Encounter (Signed)
 BCBS fed pending auth extension

## 2024-02-29 NOTE — Telephone Encounter (Signed)
 Updated Theodis Fiscal BCBS fed Siegfried Dress: [id number] (exp. 02/11/24 to 03/25/24) male tech

## 2024-03-16 DIAGNOSIS — F4310 Post-traumatic stress disorder, unspecified: Secondary | ICD-10-CM | POA: Diagnosis not present

## 2024-03-16 DIAGNOSIS — F3132 Bipolar disorder, current episode depressed, moderate: Secondary | ICD-10-CM | POA: Diagnosis not present

## 2024-03-22 ENCOUNTER — Ambulatory Visit: Admitting: Neurology

## 2024-03-22 DIAGNOSIS — Z79899 Other long term (current) drug therapy: Secondary | ICD-10-CM

## 2024-03-22 DIAGNOSIS — R6889 Other general symptoms and signs: Secondary | ICD-10-CM

## 2024-03-22 DIAGNOSIS — G4737 Central sleep apnea in conditions classified elsewhere: Secondary | ICD-10-CM

## 2024-03-22 DIAGNOSIS — G4731 Primary central sleep apnea: Secondary | ICD-10-CM

## 2024-03-22 DIAGNOSIS — G4736 Sleep related hypoventilation in conditions classified elsewhere: Secondary | ICD-10-CM | POA: Diagnosis not present

## 2024-03-22 DIAGNOSIS — I951 Orthostatic hypotension: Secondary | ICD-10-CM

## 2024-04-03 ENCOUNTER — Ambulatory Visit: Payer: Self-pay | Admitting: Neurology

## 2024-04-03 DIAGNOSIS — G4737 Central sleep apnea in conditions classified elsewhere: Secondary | ICD-10-CM | POA: Insufficient documentation

## 2024-04-03 NOTE — Procedures (Signed)
 [facility] PAP TITRATION INTERPRETATION REPORT   STUDY DATE: 03/22/2024      PATIENT NAME:  Hector [name]         DATE OF BIRTH:  02-08-1961  PATIENT ID:  [id number]    TYPE OF STUDY:  BiPAP  READING PHYSICIAN: DEDRA GORES, MD Referred by:  Dr Delayne, MD  SCORING TECHNICIAN: Donnice Counts, RPSGT   STUDY DATE: 03/22/2024              INDICATIONS:  This 63 year-old Male reports a non -patent nasal airway in relation to a [facility] performed [facility] and he left the [employer] in 1994 -RYLEI CODISPOTI is a 63 y.o. male patient who is here for revisit 01/27/2024 for BiPAP intolerance.  He attributed this to a collapse of his left nasal passage and felt he was smothered. He never had the same problem while his CPAP was used. 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 I developed renal failure with dehydration and fully recovered, but in that time I was having central apneas and started BiPAP. Complex apnea was partially attributed to narcotics therapy. Reportedly had dehydration related renal failure. Had upper GI  bleeding ulcer - blood loss. Anemia The score on the  Epworth Sleepiness Scale : 12 out of 24 (scores above or equal to 10 are suggestive of hypersomnolence).  DESCRIPTION: A sleep technologist was in attendance for the duration of the recording.  Data collection, scoring, video monitoring, and reporting were performed in compliance with the AASM Manual for the Scoring of Sleep and Associated Events; (Hypopnea is scored based on the criteria listed in Section VIII D. 1b in the AASM Manual V2.6 using a 4% oxygen desaturation rule or Hypopnea is scored based on the criteria listed in Section VIII D. 1a in the AASM Manual V2.6 using 3% oxygen desaturation and /or arousal rule).  A physician certified by the American Board of Sleep Medicine reviewed each epoch of the study.  ADDITIONAL INFORMATION:  Height:  70.0 in Weight: 216 lb (BMI 30) Neck Size: 0.0 in Medications: Tylenol , Albuterol , Zyloprim , Celebrex, Lexapro , Krill and Fish Oil Blend, Folvite , Dilaudid , Lamictal , Cozaar , Mobic , Prilosec, Percocet, Lyrica , E-Z Spacer, Flomax , Desyrel , Vitamin E, Zinc  Gluconate, Nitrostat    DESCRIPTION: A sleep technologist was in attendance for the duration of the recording.  Data collection, scoring, video monitoring, and reporting were performed in compliance with the AASM Manual for the Scoring of Sleep and Associated Events; (Hypopnea is scored based on the criteria listed in Section VIII D. 1b in the AASM Manual V2.6 using a 4% oxygen desaturation rule or Hypopnea is scored based on the criteria listed in Section VIII D. 1a in the AASM Manual V2.6 using 3% oxygen desaturation and /or arousal rule).  A physician certified by the American Board of Sleep Medicine reviewed each epoch of the study.  ADDITIONAL INFORMATION:  Height: 70.0 in Weight: 216 lb (BMI 30) Neck Size: 0.0 in    MEDICATIONS: Tylenol , Albuterol , Zyloprim , Celebrex, Lexapro , Krill and Fish Oil Blend, Folvite , Dilaudid , Lamictal , Cozaar , Mobic , Prilosec, Percocet, Lyrica , E-Z Spacer, Flomax , Desyrel , Vitamin E, Zinc  Gluconate, Nitrostat    SLEEP CONTINUITY AND SLEEP ARCHITECTURE:  Lights off was at 21:14: and lights on 05:25: (8.2 hours in bed).  Total sleep time was 372.5 minutes with a decreased sleep efficiency at 75.8%. Sleep latency was 16.5 minutes.   Of the total sleep time, the percentage  of stage N1 sleep was 7.7%, stage N2 sleep was 69.9%, stage N3 sleep was 0.0%, and REM sleep was 22.4%.  There were 4 Stage R periods observed on this study night, 20 awakenings (i.e. transitions to Stage W from any sleep stage), and 62.0 total stage transitions.  Wake after sleep onset (WASO) time accounted for 102 minutes.  RESPIRATORY MONITORING:  Based on CMS criteria (using a 4% oxygen desaturation rule for scoring hypopneas), there were 10 apneas  (1 obstructive; 9 central; 0 mixed), and 77 hypopneas.   The Apnea index was 1.6/h. Hypopnea index was 12.4/h. Total AHI : The apnea-hypopnea index was 14.0 /h overall (0.0 supine, 0.0 non-supine; 0.0 REM, 0.0 supine REM). There were 0 respiratory effort-related arousals (RERAs).    Based on AASM criteria (using a 3% oxygen desaturation and /or arousal rule for scoring hypopneas), there were 10 apneas (1 obstructive; 9 central; 0 mixed), and 104 hypopneas. Apnea index was 1.6. Hypopnea index was 16.8.  Total AHI : The apnea-hypopnea index was 18.4/h overall (0.0 supine, 0.0 non-supine; 0.0 REM, 0.0 supine REM). There were 0 respiratory effort-related arousals (RERAs).  The RERA index was 0.0 events/h.  OXIMETRY: Total sleep time spent at, or below 88% was 4.3 minutes, or 1.1% of total sleep time. Respiratory events were associated with oxyhemoglobin desaturations (nadir during sleep 82%) from a mean of 94%).  ECG: The electrocardiogram documented a regular sinus rhythm with frequent PVCs.  The average heart rate during sleep was 72 bpm.  The maximum heart rate during sleep was 93 bpm. The maximum heart rate during recording was 98 bpm.  BODY POSITION: Duration of total sleep and percent of total sleep in their respective position is as follows: supine 00 minutes (0.0%), non-supine 372.5 minutes (100.0%); right 84 minutes (22.7%), left 288 minutes (77.3%), and prone 00 minutes (0.0%). Total supine REM sleep time was 00 minutes (0.0% of total REM sleep). LIMB MOVEMENTS: There were 196 periodic limb movements of sleep (31.6/h), of which 35 (5.6/h) were associated with an arousal. AROUSAL: There were 226 arousals in total, for an arousal index of 36.4 arousals/hour.  Of these, 76 were identified as respiratory-related arousals (12.2 /h), 35 were PLM-related arousals (5.6 /h), and 110 were non-specific arousals (17.7 /h)     IMPRESSION:   The sleep study revealed moderate-severe central apnea, but  hypopneas dominated the sleep disordered breathing, to a total AHI at 3% AASM of 18.4/h.  1. BILEVEL titration covered a pressure window from 10/ 5 cm water through 17/13 cm water under a Medium FFM AirFit F 40 by [employer] .  Sleep-disordered breathing improved to an AHI of 0.5/h  at the recommended pressure of  17/13 cmH2O. Hypoxemia was resolved with therapy.   2>Frequent periodic limb movements (PLMs) of sleep were observed and contributed to the high number of arousals. These leg movements  were more frequent as OSA became better controlled and only seen in NREM sleep and independent of sleep position.  PLMs did not intrude into REM sleep.       RECOMMENDATIONS: I recommend to use BiPAP with the prescribed FFM F40 AirFit medium size at a pressure of 17/13 cm water with a slow RAMP function, heated humidification.      DEDRA GORES, MD                General Information  Name: Pacer, Dorn [facility]: 30 Physician: DEDRA GORES,   ID: [id number] Height: 70 in Technician: Sheena Cough  Sex: Male  Weight: 216 lb Record: [id number]  Age: 78 [07-Mar-1961] Date: 03/22/2024 Scorer: Donnice Counts    Pressure IPAP/EPAP 00 10 / 05 11 / 06 11 / 06 12 / 07 13 / 08 14 / 09 15 / 10   O2 Vol 0.0 0.0 0.0 0.0 0.0 0.0 0.0 0.0  Time TRT 0.45m 23.37m 28.70m 8.109m 27.110m 20.38m 60.78m 32.80m   TST 0.75m 2.5m 12.45m 5.68m 27.16m 20.59m 34.79m 32.45m  Sleep Stage % Wake 0.0 89.4 57.1 41.2 0.0 0.0 43.3 0.0   % REM 0.0 0.0 0.0 0.0 0.0 0.0 0.0 6.3   % N1 0.0 100.0 37.5 70.0 0.0 0.0 10.3 0.0   % N2 0.0 0.0 62.5 30.0 100.0 100.0 89.7 93.8   % N3 0.0 0.0 0.0 0.0 0.0 0.0 0.0 0.0  Respiratory Total Events 0 4 14 6  38 25 18 1    Obs. Apn. 0 1 0 0 0 0 0 0   Mixed Apn. 0 0 0 0 0 0 0 0   Cen. Apn. 0 3 0 6 0 0 0 0   Hypopneas 0 0 14 0 38 25 18 1    AHI 0.00 96.00 70.00 72.00 84.44 73.17 31.76 1.88   Supine AHI 0.00 0.00 0.00 0.00 0.00 0.00 0.00 0.00   Prone AHI 0.00 0.00 0.00 0.00 0.00 0.00 0.00 0.00   Side AHI  0.00 96.00 70.00 72.00 84.44 73.17 31.76 1.88  Respiratory (4%) Hypopneas (4%) 0.00 0.00 12.00 0.00 29.00 20.00 13.00 0.00   AHI (4%) 0.00 96.00 60.00 72.00 64.44 58.54 22.94 0.00   Supine AHI (4%) 0.00 0.00 0.00 0.00 0.00 0.00 0.00 0.00   Prone AHI (4%) 0.00 0.00 0.00 0.00 0.00 0.00 0.00 0.00   Side AHI (4%) 0.00 96.00 60.00 72.00 64.44 58.54 22.94 0.00  Desat Profile <= 90% 0.68m 1.60m 6.73m 3.65m 6.42m 3.70m 5.43m 1.22m   <= 80% 0.60m 0.65m 0.73m 0.31m 0.81m 0.47m 0.44m 0.30m   <= 70% 0.83m 0.42m 0.58m 0.70m 0.53m 0.59m 0.16m 0.31m   <= 60% 0.65m 0.8m 0.66m 0.22m 0.5m 0.30m 0.77m 0.71m  Arousal Index Apnea 0.0 48.0 5.0 60.0 0.0 0.0 0.0 0.0   Hypopnea 0.0 0.0 55.0 0.0 55.6 43.9 21.2 0.0   LM 0.0 0.0 0.0 0.0 0.0 2.9 7.1 7.5   Spontaneous 0.0 0.0 45.0 24.0 20.0 26.3 31.8 28.1   Pressure IPAP/EPAP 15 / 11 16 / 12 17 / 13   O2 Vol 0.0 0.0 0.0  Time TRT 76.51m 79.41m 136.30m   TST 61.51m 55.1m 123.67m  Sleep Stage % Wake 19.1 30.8 9.5   % REM 11.4 45.5 40.1   % N1 4.9 13.6 3.2   % N2 83.7 40.9 56.3   % N3 0.0 0.0 0.0  Respiratory Total Events 1 3 4    Obs. Apn. 0 0 0   Mixed Apn. 0 0 0   Cen. Apn. 0 0 0   Hypopneas 1 3 4    AHI 0.98 3.27 1.94   Supine AHI 0.00 0.00 0.00   Prone AHI 0.00 0.00 0.00   Side AHI 0.98 3.27 1.94  Respiratory (4%) Hypopneas (4%) 0.00 2.00 1.00   AHI (4%) 0.00 2.18 0.49   Supine AHI (4%) 0.00 0.00 0.00   Prone AHI (4%) 0.00 0.00 0.00   Side AHI (4%) 0.00 2.18 0.49  Desat Profile <= 90% 0.69m 0.58m 0.44m   <= 80% 0.71m 0.60m 0.64m   <= 70% 0.14m 0.74m 0.59m   <= 60% 0.41m 0.51m 0.42m  Arousal  Index Apnea 0.0 0.0 0.0   Hypopnea 1.0 3.3 0.5   LM 20.5 10.9 6.8   Spontaneous 23.4 14.2 5.8        [facility] CPAP Summary    General Information  Name: Izek, Corvino BMI: 69.00 Physician: DEDRA GORES, MD  ID: [id number] Height: 70.0 in Technician: Donnice Counts, RPSGT  Sex: Male Weight: 216.0 lb Record: [id number]  Age: 69 [1961/04/13] Date:  03/22/2024     Medical & Medication History    INDICATIONS:  This 63 year-old Male reports a non -patent nasal airway in relation to a [facility]-. [place] [employer] in 1994 -NEEKO PHARO is a 63 y.o. male patient who is here for revisit 01/27/2024 for BiPAP intolerance. He attributed this to a collapse of his left nasal passage and felt he was smothered. He never had the same problem while his CPAP was used. 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 I developed renal failure with dehydration and fully recovered ,but in that time I was having central apneas and started BiPAP. Complex apnea was partially attributed to narcotics therapy. Reportedly had dehydration related renal failure. Had GI bleed, upper , bleeding ulcer - blood loss. Anemia The score on the  Epworth Sleepiness Scale : 12 out of 24 (scores above or equal to 10 are suggestive of hypersomnolence).    Sleep Disorder      Comments   Patient arrived for a BiPAP Titration Polysomnogram. Procedure explained and all questions answered. Pt was shown BiPAP at 10/5 cm with a medium AirFit F40 full face mask prior to setup. Patient tolerated BiPAP at 10/5 cm with the medium AirFit F40 full face mask. Standard paste setup without complications. BiPAP was started at 10/5cm and increased to BiPAP ST, 17/13, BUR =10 in an attempt to abolish snoring and for central respiratory events. Patient slept left and right. Mild intermittent snoring heard. Respiratory events observed. Cardiac arrhythmias observed, including frequent PVC's. PLMS observed.    CPAP start time: 09:37:41 PM CPAP end time: 05:25:28 AM   Time Total Supine Side Prone Upright  Recording (TRT) 7h 48.66m 0h 0.54m 7h 47.86m 0h 0.42m 0h 0.87m  Sleep (TST) 6h 10.79m 0h 0.30m 6h 10.77m 0h 0.66m 0h 0.64m   Latency N1 N2 N3 REM Onset Per. Slp. Eff.  Actual 0h 0.36m 0h 3.40m 0h 0.22m 2h 51.23m 0h 3.66m 0h 34.13m 79.17%   Stg Dur Wake N1 N2 N3  REM  Total 97.5 26.0 260.5 0.0 83.5  Supine 0.5 0.0 0.0 0.0 0.0  Side 97.0 26.0 260.5 0.0 83.5  Prone 0.0 0.0 0.0 0.0 0.0  Upright 0.0 0.0 0.0 0.0 0.0   Stg % Wake N1 N2 N3 REM  Total 20.8 7.0 70.3 0.0 22.5  Supine 0.1 0.0 0.0 0.0 0.0  Side 20.7 7.0 70.3 0.0 22.5  Prone 0.0 0.0 0.0 0.0 0.0  Upright 0.0 0.0 0.0 0.0 0.0     Apnea Summary Sub Supine Side Prone Upright  Total 6 Total 6 0 6 0 0    REM 0 0 0 0 0    NREM 6 0 6 0 0  Obs 0 REM 0 0 0 0 0    NREM 0 0 0 0 0  Mix 0 REM 0 0 0 0 0    NREM 0 0 0 0 0  Cen 6 REM 0 0 0 0 0  NREM 6 0 6 0 0   Rera Summary Sub Supine Side Prone Upright  Total 0 Total 0 0 0 0 0    REM 0 0 0 0 0    NREM 0 0 0 0 0   Hypopnea Summary Sub Supine Side Prone Upright  Total 104 Total 104 0 104 0 0    REM 0 0 0 0 0    NREM 104 0 104 0 0   4% Hypopnea Summary Sub Supine Side Prone Upright  Total (4%) 77 Total 77 0 77 0 0    REM 0 0 0 0 0    NREM 77 0 77 0 0     AHI Total Obs Mix Cen  17.81 Apnea 0.97 0.00 0.00 0.97   Hypopnea 16.84 -- -- --  13.44 Hypopnea (4%) 12.47 -- -- --    Total Supine Side Prone Upright  Position AHI 17.81 0.00 17.81 0.00 0.00  REM AHI 0.00   NREM AHI 23.04   Position RDI 17.81 0.00 17.81 0.00 0.00  REM RDI 0.00   NREM RDI 23.04    4% Hypopnea Total Supine Side Prone Upright  Position AHI (4%) 13.44 0.00 13.44 0.00 0.00  REM AHI (4%) 0.00   NREM AHI (4%) 17.38   Position RDI (4%) 13.44 0.00 13.44 0.00 0.00  REM RDI (4%) 0.00   NREM RDI (4%) 17.38    Desaturation Information  <100% <90% <80% <70% <60% <50% <40%  Supine 0 0 0 0 0 0 0  Side 255 67 0 0 0 0 0  Prone 0 0 0 0 0 0 0  Upright 0 0 0 0 0 0 0  Total 255 67 0 0 0 0 0  Desaturation threshold setting: 3% Minimum desaturation setting: 10 seconds SaO2 nadir: 77% The longest event was a 21 sec obstructive Hypopnea with a minimum SaO2 of 85%. The lowest SaO2 was 82% associated with a 15 sec central Apnea. EKG Rates EKG Avg Max Min  Awake 74 92 60   Asleep 72 93 56  EKG Events: NA Awakening/Arousal Information # of Awakenings 18  Wake after sleep onset 94.99m  Wake after persistent sleep 78.36m   Arousal Assoc. Arousals Index  Apneas 6 1.0  Hypopneas 68 11.0  Leg Movements 54 8.7  Snore 0.0 0.0  PTT Arousals 0 0.0  Spontaneous 111 18.0  Total 238 38.5  Myoclonus Information PLMS LMs Index  Total LMs during PLMS 196 31.7  LMs w/ Microarousals 35 5.7   LM LMs Index  w/ Microarousal 19 3.1  w/ Awakening 1 0.2  w/ Resp Event 1 0.2  Spontaneous 24 3.9  Total 44 7.1

## 2024-04-19 NOTE — Telephone Encounter (Signed)
 I called pt. I advised pt that Dr. Chalice reviewed their sleep study results and found that pt tolerated BiPAP. Dr. Chalice recommends that pt start BiPAP. I reviewed PAP compliance expectations with the pt. Pt is agreeable to starting a BiPAP. I advised pt that an order will be sent to a DME, [facility], and [facility] will call the pt within about one week after they file with the pt's insurance. [facility] will show the pt how to use the machine, fit for masks, and troubleshoot the CPAP if needed. A follow up appt was made for insurance purposes with Greig Forbes, NP on 07/13/2024 at 9 am. Pt verbalized understanding to arrive 15 minutes early and bring their BiPAP. Pt verbalized understanding of results. Pt had no questions at this time but was encouraged to call back if questions arise. I have sent the order to [facility] and have received confirmation that they have received the order.

## 2024-04-19 NOTE — Telephone Encounter (Signed)
-----   Message from [name] Dohmeier sent at 04/03/2024  5:53 PM EDT ----- I recommend to use BiPAP with the prescribed FFM F40 AirFit medium size at a pressure of 17/13 cm water with a slow RAMP function, heated humidification.   Sleep Clinic Patients are generally offered input on sleep hygiene, life style changes and how to improve compliance with medical treatment where applicable. Review and reiteration of good sleep hygiene measures is offered to any sleep clinic patient, be it in the first consultation or with any follow up visits.  Any PAP patient should be reminded to be fully compliant with PAP therapy , (defined as using PAP therapy for more than 4 hours each night ) with the goal to improve sleep related symptoms and  decrease long term cardiovascular risks. Any PAP therapy patient should be reminded, that it may take up to 3 months to get fully used to using PAP and it may take 1-2 weeks for an established PAP  user to acclimatize to changes in pressure or mask. The earlier full compliance is achieved, the better long term compliance tends to be.   Please note that untreated obstructive sleep apnea may carry additional perioperative morbidity. Patients with significant obstructive sleep apnea should receive perioperative PAP therapy and the  surgical team should be informed of the diagnosis and degree of sleep disordered breathing.  Sleep fragmentation in the presence of normal proportional sleep stages is a nonspecific findings and per se does not signify an intrinsic sleep disorder or a cause for the patient's sleep-related  symptoms.  Causes include (but are not limited to) the unfamiliarity of sleeping while recorded by HST device or sleeping in a sleep lab for a full Polysomnography sleep study, but also circadian rhythm  disturbances, medication side effects or an underlying mood disorder or medical problem.    Any patient with sleepiness should be cautioned not to drive, work at  heights, or operate dangerous or heavy equipment when feeling tired or sleepy.      The patient will be seen in follow-up in the sleep clinic at [facility] for discussion of test results, sleep related symptoms and treatment compliance review, further management strategies, etc.   The referring provider will be notified of the test results.  ----- Message ----- From: Chalice Saunas, MD Sent: 04/03/2024   5:46 PM EDT To: Saunas Chalice, MD

## 2024-05-05 DIAGNOSIS — F4310 Post-traumatic stress disorder, unspecified: Secondary | ICD-10-CM | POA: Diagnosis not present

## 2024-05-05 DIAGNOSIS — F3132 Bipolar disorder, current episode depressed, moderate: Secondary | ICD-10-CM | POA: Diagnosis not present

## 2024-07-11 NOTE — Progress Notes (Unsigned)
 PATIENT: Hector [name] DOB: April 20, 1961  REASON FOR VISIT: follow up HISTORY FROM: patient  No chief complaint on file.    HISTORY OF PRESENT ILLNESS:  07/11/24 ALL: Hector [name] is a 64 y.o. male here today for follow up for OSA on BiPAP. He was last seen by Hector [name] 01/2024 and wished to resume sleep apnea treatment. Had previou difficulty with CPAP. Titration study performed and BiPAP advised. Since,     HISTORY: (copied from Hector [name]'s previous notes)  Hector [name] is a 63 y.o. male patient who is here for revisit 01/27/2024 for  BiPAP intolerance. He attributed this to a collapse of his left nasal passage and felt he was smothered.  He never had the same problem while his CPAP was used. He had several ENT surgeries over decades and he has now no longer a patent nasal passage-    Chief concern according to patient :   I filed a claim for sleep apnea with the [place], stating that my [facility] guided nasal surgery was a cause for the sleep apnea, snoring, and interrupted sleep.  I left the [employer] in 1994 and had already 2 septoplasties.    Septoplasty and nasal surgeries have predated the  dx of sleep apnea.  I developed renal failure with dehydration and fully recovered , in that time I was having central apneas and started BiPAP.     Complex apnea was partially attributed to narcotics therapy.  Reportedly had dehydration related renal failure. Had GI bleed, upper , bleeding ulcer - blood loss. Anemia-   Hector [name] and GI physician. Now on Prilosec.      REVIEW OF SYSTEMS: Out of a complete 14 system review of symptoms, the patient complains only of the following symptoms, and all other reviewed systems are negative.  ESS:  ALLERGIES: Allergies  Allergen Reactions   Aspirin  Other (See Comments)    perforated ulcer   Cymbalta [Duloxetine Hcl] Other (See Comments)    Over-sedating; slept over 24 hours with one dose.  Possible serotonin syndrome.   Nsaids Other  (See Comments)    Perforated ulcer   Tape Rash    Surgical tape   Hydrocodone  Other (See Comments)    headache   Vancomycin  Rash    Red man syndrome.  Please premedicate.    HOME MEDICATIONS: Outpatient Medications Prior to Visit  Medication Sig Dispense Refill   acetaminophen  (TYLENOL ) 500 MG tablet Take 1,000 mg by mouth every 6 (six) hours as needed for mild pain.     albuterol  (PROVENTIL  HFA;VENTOLIN  HFA) 108 (90 Base) MCG/ACT inhaler Inhale 2 puffs into the lungs every 6 (six) hours as needed for wheezing or shortness of breath. 1 Inhaler 0   allopurinol  (ZYLOPRIM ) 100 MG tablet TAKE 1 TABLET BY MOUTH DAILY 30 tablet 0   celecoxib (CELEBREX) 200 MG capsule Take 200 mg by mouth daily as needed for mild pain or moderate pain.     colchicine  0.6 MG tablet TAKE ONE TABLET BY MOUTH DAILY AS NEEDED FOR GOUT 30 tablet 1   escitalopram  (LEXAPRO ) 20 MG tablet Take 20 mg by mouth every morning.      Fish Oil-Krill Oil (KRILL & FISH OIL BLEND PO) Take 1 capsule by mouth daily.     lamoTRIgine  (LAMICTAL ) 100 MG tablet Take 150 mg by mouth 2 (two) times daily.      losartan  (COZAAR ) 25 MG tablet Take 1 tablet (25 mg total) by mouth  daily. Take if SBP >130 mmHg in am and take additional dose at night if SBP >130 mmHg (Patient taking differently: Take 25 mg by mouth as needed. Take if SBP >130 mmHg in am and take additional dose at night if SBP >130 mmHg) 90 tablet 3   methocarbamol  (ROBAXIN ) 500 MG tablet Take 1 tablet (500 mg total) by mouth daily as needed for muscle spasms. 90 tablet 0   MOUNJARO 7.5 MG/0.5ML Pen Inject 7.5 mg into the skin once a week.     Multiple Vitamin (MULTIVITAMIN) capsule Take 1 capsule by mouth daily.     nitroGLYCERIN  (NITROSTAT ) 0.3 MG SL tablet Place 1 tablet (0.3 mg total) under the tongue every 5 (five) minutes as needed for chest pain. 100 tablet 3   oxyCODONE -acetaminophen  (PERCOCET) 10-325 MG tablet Take 1 tablet by mouth 4 (four) times daily as needed for pain.      pregabalin  (LYRICA ) 150 MG capsule Take 150 mg by mouth 3 (three) times daily.     Spacer/Aero-Holding Chambers (E-Z SPACER) inhaler Use as instructed 1 each 2   tamsulosin  (FLOMAX ) 0.4 MG CAPS capsule Take 0.4 mg by mouth daily as needed (kidney stones).      traZODone  (DESYREL ) 100 MG tablet Take 300 mg by mouth at bedtime as needed for sleep.     No facility-administered medications prior to visit.    PAST MEDICAL HISTORY: Past Medical History:  Diagnosis Date   Abnormal LFTs    improved after gastic bypass   ADJ DISORDER WITH MIXED ANXIETY & DEPRESSED MOOD 08/23/2009   Qualifier: Diagnosis of  By: Charlett MD, Wanda K Now on lamictal  and lexapro  and elevil for sleep.    Anxiety    Ascending aortic aneurysm    a. 06/2018 CT: 4.1cm.   B12 nutritional deficiency    Bipolar disorder (HCC)    Carpal tunnel syndrome 04/06/2007   Qualifier: Diagnosis of  By: Laurice, CMA (AAMA), Clotilda S    Chest pain    a. 2007 MV: No ischemia; b. 09/2018 recurrent c/p in setting of PE; c. 09/2018 MV: No ischemia/infarct. EF 55-65%.   Closed head injury    laceration  9 12  no loc  vision change   Complete rotator cuff tear 04/09/2022   DDD (degenerative disc disease)    Depression    Diabetes mellitus    resolved with gastric bypass   GERD (gastroesophageal reflux disease)    no meds   Gout    Headache    once per week uses Indomethicin hx of cluster headaches    History of bunionectomy of left great toe    History of concussion    History of echocardiogram    a. 09/2018 Echo: Ef 65-70%, no rwma, mild MR. Ao root 41mm (mildly dil).   History of kidney stones    History of renal stone    Seen in emergency room not urologist   Hyperlipidemia    Hypertension 02/27/2017   Neuromuscular disorder (HCC)    Nonspecific pain in the lumbar region    OBESITY 12/10/2007   a. s/p gastric bypass.   OSA (obstructive sleep apnea) 12/12/2013   resolved by weight loss surgery 10/26/2009    Osteoarthritis of knee    Pain in left knee    Pancreatitis 03/21/2016   went to [place]   Perforated ulcer [facility])    Surgical repair9/24 2011   Peripheral neuropathy    PONV (postoperative nausea and vomiting)  PTSD (post-traumatic stress disorder)    Pulmonary embolism (HCC)    a. 09/18/2018 CTA Chest: RLL segmental arterial branch small volume PE-->Eliquis .   remote Lumbar radiculopathy 10/01/2021   Sleep-related hypoxia 11/10/2021    PAST SURGICAL HISTORY: Past Surgical History:  Procedure Laterality Date   ABDOMINAL EXPOSURE N/A 03/29/2020   Procedure: ABDOMINAL EXPOSURE;  Surgeon: Oris Krystal FALCON, MD;  Location: [facility] OR;  Service: Vascular;  Laterality: N/A;   ANTERIOR LUMBAR FUSION N/A 03/29/2020   Procedure: ANTERIOR LUMBAR FUSION (ALIF) L4-5;  Surgeon: Burnetta Aures, MD;  Location: [facility] OR;  Service: Orthopedics;  Laterality: N/A;  5.5 hrs Hector. Oris do approach Lt tap block with exparel    BACK SURGERY  2012   Hector. Travis disk   CARPAL TUNNEL RELEASE  2008   left-ulnar nerve decomp   carpel tunnel Left    CHOLECYSTECTOMY N/A 03/21/2016   Procedure: LAPAROSCOPIC CHOLECYSTECTOMY WITH INTRAOPERATIVE CHOLANGIOGRAM;  Surgeon: Lynda Leos, MD;  Location: [facility] ORS;  Service: General;  Laterality: N/A;   colonscopy      DEBRIDEMENT AND CLOSURE WOUND N/A 05/09/2020   Procedure: Irrigation and debridement of superficial wound closure;  Surgeon: Burnetta Aures, MD;  Location: [facility] OR;  Service: Orthopedics;  Laterality: N/A;  1 hr   DIAGNOSTIC LAPAROSCOPY     laparoscopic gastric bypass   EPIDURAL BLOCK INJECTION     by Hector. Bonner   FOOT SURGERY     bilat for removal of extra bone    GASTRIC BYPASS  11/06/09   beginning weight 267   HARDWARE REMOVAL N/A 05/02/2016   Procedure: Removal of hardware Lumbar five-Sacral one with Metrex;  Surgeon: Victory Gens, MD;  Location: [facility] OR[facility];  Service: Neurosurgery;  Laterality: N/A;  Removal of hardware L5-S1 with Metrex   HERNIA REPAIR   1998   umbilical   HIATAL HERNIA REPAIR N/A 01/20/2013   Procedure: LAPAROSCOPIC REPAIR OF INTERNAL HERNIA;  Surgeon: Alm VEAR Angle, MD;  Location: [facility] ORS;  Service: General;  Laterality: N/A;   LUMBAR LAMINECTOMY/DECOMPRESSION MICRODISCECTOMY Left 06/07/2014   Procedure: MICRO LUMBAR DECOMPRESSION L5 - S1 ON THE LEFT/L5 FORAMINOTOMY/EXCISION SYNOVIAL CYST  1 LEVEL;  Surgeon: Reyes JAYSON Billing, MD;  Location: [facility] ORS;  Service: Orthopedics;  Laterality: Left;   MASS EXCISION Right 04/18/2013   Procedure: RIGHT SMALL FINGER SKIN LESION EXCISION;  Surgeon: Alm DELENA Hummer, MD;  Location: [facility];  Service: Orthopedics;  Laterality: Right;   NASAL SEPTOPLASTY W/ TURBINOPLASTY     x 2   NEPHROLITHOTOMY Right 11/03/2014   Procedure: RIGHT PERCUTANEOUS NEPHROLITHOTOMY;  Surgeon: Oneil JAYSON Rafter, MD;  Location: [facility] ORS;  Service: Urology;  Laterality: Right;   SHOULDER ARTHROSCOPY  2014   rt   SHOULDER ARTHROSCOPY WITH ROTATOR CUFF REPAIR AND SUBACROMIAL DECOMPRESSION Left 04/09/2022   Procedure: LEFT SHOULDER ARTHROSCOPY WITH MINI OPEN ROTATOR CUFF REPAIR, SUBACROMIAL DECOMPRESSION, POSSIBLE DISTAL CLAVICLE RESECTION AND BONE GRAFTING HUMERAL HEAD WITH ALLOGRAFT AND AUTOLOGOUS BONE;  Surgeon: Billing Reyes, MD;  Location: [facility] ORS;  Service: Orthopedics;  Laterality: Left;  2 hrs   surgerical repair of stomach ulcer  06/08/10   per   TONSILLECTOMY     UVULOPALATOPHARYNGOPLASTY  2007   with tonsils   VASECTOMY      FAMILY HISTORY: Family History  Problem Relation Age of Onset   Uterine cancer Mother    Hypertension Mother    Cervical cancer Mother    Heart disease Father    COPD Father  Seizures Father    Arthritis Father    Arthritis Sister    Diabetes Sister    Colon cancer Maternal Uncle    Colon cancer Paternal Aunt    Diabetes Paternal Aunt    Heart disease Maternal Grandmother    Heart disease Maternal Grandfather    Heart disease Paternal Grandmother    Heart  disease Paternal Grandfather    Diabetes Paternal Uncle     SOCIAL HISTORY: Social History   Socioeconomic History   Marital status: Married    Spouse name: Janine   Number of children: 2   Years of education: Associates   Highest education level: Not on file  Occupational History   Occupation: retired    Associate Professor: [employer]  Tobacco Use   Smoking status: Never   Smokeless tobacco: Never  Vaping Use   Vaping status: Never Used  Substance and Sexual Activity   Alcohol use: Yes    Comment: occas liquor    Drug use: No   Sexual activity: Yes  Other Topics Concern   Not on file  Social History Narrative   Patient is married Actuary) and lives at home with his wife.   Patient has two children.   Patient has a college education.   Regular exercise-no   Employed patent examiner and investigation   school travels       10 hours work     Sleep  No longer needs  cpap   Patient is right-handed.   Patient drinks one soda daily.   trainiing for [employer]      Social Drivers of Health   Financial Resource Strain: Low Risk  (04/19/2020)   Received from [facility]   Overall Financial Resource Strain (CARDIA)    Difficulty of Paying Living Expenses: Not hard at all  Food Insecurity: No Food Insecurity (04/19/2020)   Received from [facility]   Hunger Vital Sign    Within the past 12 months, you worried that your food would run out before you got the money to buy more.: Never true    Within the past 12 months, the food you bought just didn't last and you didn't have money to get more.: Never true  Transportation Needs: No Transportation Needs (04/19/2020)   Received from [facility] - Transportation    In the past 12 months, has lack of transportation kept you from medical appointments or from getting medications?: No    Lack of Transportation (Non-Medical): No  Physical Activity: Inactive (04/19/2020)    Received from [facility]   Exercise Vital Sign    On average, how many days per week do you engage in moderate to strenuous exercise (like a brisk walk)?: 0 days    On average, how many minutes do you engage in exercise at this level?: 0 min  Stress: No Stress Concern Present (04/19/2020)   Received from [facility] - Occupational Stress Questionnaire    Feeling of Stress : Not at all  Social Connections: Socially Integrated (04/19/2020)   Received from [facility]   Social Connection and Isolation Panel    In a typical week, how many times do you talk on the phone with family, friends, or neighbors?: Three times a week    How often do you get together with friends or relatives?: Once a week    How often do  you attend church or religious services?: 1 to 4 times per year    Do you belong to any clubs or organizations such as church groups, unions, fraternal or athletic groups, or school groups?: Yes    How often do you attend meetings of the clubs or organizations you belong to?: More than 4 times per year    Are you married, widowed, divorced, separated, never married, or living with a partner?: Married  Intimate Partner Violence: Not on file     PHYSICAL EXAM  There were no vitals filed for this visit. There is no height or weight on file to calculate BMI.  Generalized: Well developed, in no acute distress  Cardiology: normal rate and rhythm, no murmur noted Respiratory: clear to auscultation bilaterally  Neurological examination  Mentation: Alert oriented to time, place, history taking. Follows all commands speech and language fluent Cranial nerve II-XII: Pupils were equal round reactive to light. Extraocular movements were full, visual field were full  Motor: The motor testing reveals 5 over 5 strength of all 4 extremities. Good symmetric motor tone is noted throughout.  Gait and station:  Gait is normal.    DIAGNOSTIC DATA (LABS, IMAGING, TESTING) - I reviewed patient records, labs, notes, testing and imaging myself where available.      No data to display           Lab Results  Component Value Date   WBC 4.3 08/22/2022   HGB 13.5 08/22/2022   HCT 44.2 08/22/2022   MCV 89 08/22/2022   PLT 135 (L) 08/22/2022      Component Value Date/Time   NA 145 (H) 08/22/2022 0714   K 5.1 08/22/2022 0714   CL 107 (H) 08/22/2022 0714   CO2 23 08/22/2022 0714   GLUCOSE 118 (H) 08/22/2022 0714   GLUCOSE 138 (H) 04/10/2022 0348   BUN 10 08/22/2022 0714   CREATININE 1.25 08/22/2022 0714   CALCIUM  9.4 08/22/2022 0714   PROT 7.2 08/22/2022 0714   ALBUMIN 4.5 08/22/2022 0714   AST 26 08/22/2022 0714   ALT 18 08/22/2022 0714   ALKPHOS 140 (H) 08/22/2022 0714   BILITOT 0.2 08/22/2022 0714   GFRNONAA >60 04/10/2022 0348   GFRAA >60 05/08/2020 0841   Lab Results  Component Value Date   CHOL 141 08/22/2022   HDL 45 08/22/2022   LDLCALC 71 08/22/2022   LDLDIRECT 113.2 04/01/2007   TRIG 144 08/22/2022   CHOLHDL 3.1 08/22/2022   Lab Results  Component Value Date   HGBA1C 5.4 04/08/2022   Lab Results  Component Value Date   VITAMINB12 304 08/22/2022   Lab Results  Component Value Date   TSH 1.860 08/22/2022     ASSESSMENT AND PLAN 63 y.o. year old male  has a past medical history of Abnormal LFTs, ADJ DISORDER WITH MIXED ANXIETY & DEPRESSED MOOD (08/23/2009), Anxiety, Ascending aortic aneurysm, B12 nutritional deficiency, Bipolar disorder (HCC), Carpal tunnel syndrome (04/06/2007), Chest pain, Closed head injury, Complete rotator cuff tear (04/09/2022), DDD (degenerative disc disease), Depression, Diabetes mellitus, GERD (gastroesophageal reflux disease), Gout, Headache, History of bunionectomy of left great toe, History of concussion, History of echocardiogram, History of kidney stones, History of renal stone, Hyperlipidemia, Hypertension (02/27/2017), Neuromuscular  disorder (HCC), Nonspecific pain in the lumbar region, OBESITY (12/10/2007), OSA (obstructive sleep apnea) (12/12/2013), Osteoarthritis of knee, Pain in left knee, Pancreatitis (03/21/2016), Perforated ulcer (HCC), Peripheral neuropathy, PONV (postoperative nausea and vomiting), PTSD (post-traumatic stress disorder), Pulmonary embolism (HCC), remote Lumbar radiculopathy (10/01/2021), and  Sleep-related hypoxia (11/10/2021). here with   No diagnosis found.   Hector [name] is doing well on BiPAP therapy. Compliance report reveals ***. *** was encouraged to continue using BiPAP nightly and for greater than 4 hours each night. We will update supply orders as indicated. Risks of untreated sleep apnea review and education materials provided. Healthy lifestyle habits encouraged. *** will follow up in ***, sooner if needed. *** verbalizes understanding and agreement with this plan.    No orders of the defined types were placed in this encounter.    No orders of the defined types were placed in this encounter.       Greig Forbes, FNP-C 07/11/2024, 1:04 PM [facility] [address] [place], [place] [address] [phone]

## 2024-07-11 NOTE — Patient Instructions (Incomplete)

## 2024-07-12 NOTE — Progress Notes (Unsigned)
 SABRA

## 2024-07-13 ENCOUNTER — Ambulatory Visit: Admitting: Family Medicine

## 2024-07-13 ENCOUNTER — Encounter: Payer: Self-pay | Admitting: Family Medicine

## 2024-07-13 VITALS — BP 130/92 | HR 100 | Ht 70.0 in | Wt 193.5 lb

## 2024-07-13 DIAGNOSIS — Z79891 Long term (current) use of opiate analgesic: Secondary | ICD-10-CM

## 2024-07-13 DIAGNOSIS — G4730 Sleep apnea, unspecified: Secondary | ICD-10-CM

## 2024-07-13 DIAGNOSIS — G4731 Primary central sleep apnea: Secondary | ICD-10-CM | POA: Diagnosis not present

## 2024-07-13 DIAGNOSIS — R6889 Other general symptoms and signs: Secondary | ICD-10-CM

## 2024-07-13 DIAGNOSIS — Z79899 Other long term (current) drug therapy: Secondary | ICD-10-CM

## 2024-07-24 ENCOUNTER — Encounter: Payer: Self-pay | Admitting: Neurology

## 2024-07-27 NOTE — Progress Notes (Signed)
 In lab BIPAP at 15/ 10 was the most successful setting, all other settings revealed double digit AHIs. I can offer to increase to 16/ 11 cm water. I have his letter on my to-do-list , will finish this Friday.  CESAR Saunas Kahlea Cobert

## 2024-07-27 NOTE — Addendum Note (Signed)
 Addended by: CHALICE SAUNAS on: 07/27/2024 05:28 PM   Modules accepted: Orders

## 2024-12-07 ENCOUNTER — Ambulatory Visit: Admitting: Neurology
